# Patient Record
Sex: Female | Born: 1950
Health system: Southern US, Community
[De-identification: ages and names within clinical notes are randomized; demographics above are authoritative.]

## PROBLEM LIST (undated history)

## (undated) DIAGNOSIS — T8859XA Other complications of anesthesia, initial encounter: Secondary | ICD-10-CM

## (undated) DIAGNOSIS — N1832 Chronic kidney disease, stage 3b: Secondary | ICD-10-CM

## (undated) DIAGNOSIS — Z7401 Bed confinement status: Secondary | ICD-10-CM

## (undated) DIAGNOSIS — Z87442 Personal history of urinary calculi: Secondary | ICD-10-CM

## (undated) DIAGNOSIS — C539 Malignant neoplasm of cervix uteri, unspecified: Secondary | ICD-10-CM

## (undated) DIAGNOSIS — R112 Nausea with vomiting, unspecified: Secondary | ICD-10-CM

## (undated) DIAGNOSIS — M1990 Unspecified osteoarthritis, unspecified site: Secondary | ICD-10-CM

## (undated) DIAGNOSIS — T4145XA Adverse effect of unspecified anesthetic, initial encounter: Secondary | ICD-10-CM

## (undated) DIAGNOSIS — I10 Essential (primary) hypertension: Secondary | ICD-10-CM

## (undated) DIAGNOSIS — Z9889 Other specified postprocedural states: Secondary | ICD-10-CM

## (undated) HISTORY — DX: Unspecified osteoarthritis, unspecified site: M19.90

## (undated) HISTORY — DX: Essential (primary) hypertension: I10

## (undated) HISTORY — DX: Malignant neoplasm of cervix uteri, unspecified: C53.9

## (undated) HISTORY — PX: JOINT REPLACEMENT: SHX530

---
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
## 1979-09-11 HISTORY — PX: TOTAL HIP ARTHROPLASTY: SHX124

## 1988-09-10 HISTORY — PX: TOTAL KNEE ARTHROPLASTY: SHX125

## 2001-06-12 ENCOUNTER — Emergency Department (HOSPITAL_COMMUNITY): Admission: EM | Admit: 2001-06-12 | Discharge: 2001-06-12 | Payer: Self-pay | Admitting: Emergency Medicine

## 2001-06-12 ENCOUNTER — Encounter: Payer: Self-pay | Admitting: Emergency Medicine

## 2003-09-09 DIAGNOSIS — C539 Malignant neoplasm of cervix uteri, unspecified: Secondary | ICD-10-CM | POA: Insufficient documentation

## 2003-09-10 HISTORY — PX: ABDOMINAL HYSTERECTOMY: SHX81

## 2012-04-08 ENCOUNTER — Ambulatory Visit: Payer: Self-pay | Admitting: Family Medicine

## 2013-03-16 DIAGNOSIS — Z9889 Other specified postprocedural states: Secondary | ICD-10-CM | POA: Insufficient documentation

## 2013-03-16 DIAGNOSIS — Z96659 Presence of unspecified artificial knee joint: Secondary | ICD-10-CM | POA: Insufficient documentation

## 2014-05-21 DIAGNOSIS — H2510 Age-related nuclear cataract, unspecified eye: Secondary | ICD-10-CM | POA: Insufficient documentation

## 2014-07-07 DIAGNOSIS — H25819 Combined forms of age-related cataract, unspecified eye: Secondary | ICD-10-CM | POA: Insufficient documentation

## 2014-09-22 DIAGNOSIS — Z9849 Cataract extraction status, unspecified eye: Secondary | ICD-10-CM | POA: Insufficient documentation

## 2015-02-10 ENCOUNTER — Telehealth: Payer: Self-pay | Admitting: Family Medicine

## 2015-02-10 DIAGNOSIS — M069 Rheumatoid arthritis, unspecified: Secondary | ICD-10-CM

## 2015-02-10 NOTE — Telephone Encounter (Signed)
Pt would like to get a refill on Hydrocodone 7.5-325 mg. Thanks TNP

## 2015-02-11 NOTE — Telephone Encounter (Signed)
Pt called to see if RX was ready for pick up. Thanks TNP

## 2015-02-14 MED ORDER — HYDROCODONE-ACETAMINOPHEN 7.5-325 MG PO TABS: 1.0000 | ORAL_TABLET | Freq: Four times a day (QID) | ORAL | Status: DC | PRN

## 2015-02-14 NOTE — Telephone Encounter (Signed)
Advise patient prescription should be ready for pick up at the front desk now. Thanks for your patience.

## 2015-02-14 NOTE — Telephone Encounter (Signed)
Pt would like to get this today because she is going out of town tomorrow and will run out of the medications while out of town. Thanks TNP

## 2015-02-14 NOTE — Telephone Encounter (Signed)
Pt advised. Thanks TNP

## 2015-02-28 DIAGNOSIS — M069 Rheumatoid arthritis, unspecified: Secondary | ICD-10-CM | POA: Insufficient documentation

## 2015-02-28 DIAGNOSIS — R030 Elevated blood-pressure reading, without diagnosis of hypertension: Secondary | ICD-10-CM

## 2015-02-28 DIAGNOSIS — IMO0002 Reserved for concepts with insufficient information to code with codable children: Secondary | ICD-10-CM | POA: Insufficient documentation

## 2015-02-28 DIAGNOSIS — L821 Other seborrheic keratosis: Secondary | ICD-10-CM | POA: Insufficient documentation

## 2015-02-28 DIAGNOSIS — IMO0001 Reserved for inherently not codable concepts without codable children: Secondary | ICD-10-CM | POA: Insufficient documentation

## 2015-02-28 DIAGNOSIS — N200 Calculus of kidney: Secondary | ICD-10-CM | POA: Insufficient documentation

## 2015-02-28 DIAGNOSIS — N3040 Irradiation cystitis without hematuria: Secondary | ICD-10-CM | POA: Insufficient documentation

## 2015-02-28 DIAGNOSIS — I10 Essential (primary) hypertension: Secondary | ICD-10-CM | POA: Insufficient documentation

## 2015-03-01 ENCOUNTER — Encounter: Payer: Self-pay | Admitting: Family Medicine

## 2015-03-01 ENCOUNTER — Ambulatory Visit (INDEPENDENT_AMBULATORY_CARE_PROVIDER_SITE_OTHER): Payer: Medicare Other | Admitting: Family Medicine

## 2015-03-01 ENCOUNTER — Other Ambulatory Visit: Payer: Self-pay

## 2015-03-01 VITALS — BP 136/98 | HR 68 | Temp 98.1°F | Resp 16 | Wt 138.2 lb

## 2015-03-01 DIAGNOSIS — R030 Elevated blood-pressure reading, without diagnosis of hypertension: Secondary | ICD-10-CM

## 2015-03-01 DIAGNOSIS — N3040 Irradiation cystitis without hematuria: Secondary | ICD-10-CM

## 2015-03-01 DIAGNOSIS — IMO0001 Reserved for inherently not codable concepts without codable children: Secondary | ICD-10-CM

## 2015-03-01 DIAGNOSIS — M069 Rheumatoid arthritis, unspecified: Secondary | ICD-10-CM | POA: Diagnosis not present

## 2015-03-01 MED ORDER — PREDNISONE 5 MG (21) PO TBPK: 5.0000 mg | ORAL_TABLET | Freq: Every day | ORAL | Status: DC

## 2015-03-01 MED ORDER — OXAPROZIN 600 MG PO TABS: 600.0000 mg | ORAL_TABLET | Freq: Every day | ORAL | Status: DC

## 2015-03-01 NOTE — Progress Notes (Signed)
Subjective:    Patient ID: Jeanne [name], female    DOB: 1951-07-06, 64 y.o.   MRN: [id number]  HPI Arthritis flaring with worsening in back, knees and shoulders over the past couple week. Has run out of her Daypro and requesting prednisone taper. Throbbing pain across lower back in the area of impact when she fell flat on the floor of her laundry room when she lost her balance putting clothes in the dryer a year ago. Went to the ER at that time but not fractures on x-ray or CT scan of brain since she bumped her head.  Patient Active Problem List   Diagnosis Date Noted  . Chronic radiation cystitis 02/28/2015  . Coitalgia 02/28/2015  . Blood pressure elevated 02/28/2015  . BP (high blood pressure) 02/28/2015  . Calculus of kidney 02/28/2015  . Arthritis or polyarthritis, rheumatoid 02/28/2015  . Basal cell papilloma 02/28/2015  . H/O cataract extraction 09/22/2014  . Combined form of senile cataract 07/07/2014  . NS (nuclear sclerosis) 05/21/2014  . History of repair of hip joint 03/16/2013  . H/O total knee replacement 03/16/2013  . 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 Abdominal hysterectomy  09/10/2003  . Total hip arthroplasty Right 1981  . Total hip arthroplasty Left 1990  . Total knee arthroplasty Right    History  Substance Use Topics  . Smoking status: Former Smoker -- 20 years  . Smokeless tobacco: Not on file     Comment: Ada  . Alcohol Use: No   Family History  Problem Relation Age of Onset  . Breast cancer Mother   . Hypertension Father   . Cancer Father    Current Outpatient Prescriptions on File Prior to Visit  Medication Sig Dispense Refill  . HYDROcodone-acetaminophen (NORCO) 7.5-325 MG per tablet Take 1 tablet by mouth every 6 (six) hours as needed for moderate pain. 30 tablet 0  . oxaprozin (DAYPRO) 600 MG tablet Take 1 tablet by mouth daily.    . potassium citrate (UROCIT-K) 10  MEQ (1080 MG) SR tablet Take 2 tablets by mouth daily.     No current facility-administered medications on file prior to visit.   Allergies  Allergen Reactions  . Codeine Nausea Only and Nausea And Vomiting  . Sulfa Antibiotics     Mouth ulcers   Review of Systems  Constitutional: Negative.   HENT: Negative.   Respiratory: Negative.   Cardiovascular: Negative.   Gastrointestinal: Positive for constipation. Negative for blood in stool.       Constipation controlled with use of Senakot.   Genitourinary: Negative.   Musculoskeletal: Positive for myalgias, back pain, joint swelling, arthralgias and neck stiffness.  Neurological: Negative.   Psychiatric/Behavioral: Negative.       BP 136/98 mmHg  Pulse 68  Temp(Src) 98.1 F (36.7 C) (Oral)  Resp 16  Wt 138 lb 3.2 oz (62.687 kg)  Objective:   Physical Exam  Constitutional: She is oriented to person, place, and time. She appears well-nourished. No distress.  Musculoskeletal:  Scars on both knees from joint replacements. Enlarged and irregular elbow, finger, wrists and knees. Stiff with very much diminished ROM of fingers, wrist, back, knees and hips. Some soreness in upper lumbar region.  Neurological: She is alert and oriented to person, place, and time.  Abnormal gait due to multiple joint issue from RA.  Skin: Skin is warm and dry.      Assessment &  Plan:  1. Arthritis or polyarthritis, rheumatoid Onset as a child with first hip replacement surgery at age 89. Multiple nodules both elbows, scars from past hip and knee replacements and stiffness with very weak hands. Gait is very stiff, slow and shuffling with a waddle due to stiff hip joints. Recheck flare not controlled by usual Norco and Daypro. Will refill Daypro and add a prednisone taper. Recheck in a week if no better. - oxaprozin (DAYPRO) 600 MG tablet; Take 1 tablet (600 mg total) by mouth daily.  Dispense: 30 tablet; Refill: 3 - predniSONE (STERAPRED UNI-PAK 21 TAB) 5  MG (21) TBPK tablet; Take 1 tablet (5 mg total) by mouth daily. Tapered as directed on package  Dispense: 21 tablet; Refill: 0  2. Chronic radiation cystitis Onset after treatment of cervical cancer and radiation treatments. Easily has recurrent UTI's. Past urologists have not been able to completely control recurrent UTI's. Encouraged to drink extra fluids and recheck prn.  3. Blood pressure elevated Some elevation of BP today. Recommend she limit sodium intake and recheck BP at home. Recheck in 2 weeks if needed.

## 2015-03-10 ENCOUNTER — Encounter: Payer: Self-pay | Admitting: Family Medicine

## 2015-03-16 ENCOUNTER — Encounter: Payer: Self-pay | Admitting: Family Medicine

## 2015-03-21 NOTE — Telephone Encounter (Signed)
Pt called to see if there was response to her email sent on 03/10/15 and 03/16/15.  CB#[phone]/MJ

## 2015-03-22 ENCOUNTER — Telehealth: Payer: Self-pay | Admitting: Family Medicine

## 2015-03-22 ENCOUNTER — Encounter: Payer: Self-pay | Admitting: Family Medicine

## 2015-03-22 DIAGNOSIS — M069 Rheumatoid arthritis, unspecified: Secondary | ICD-10-CM

## 2015-03-22 MED ORDER — HYDROCODONE-ACETAMINOPHEN 7.5-325 MG PO TABS: 1.0000 | ORAL_TABLET | Freq: Four times a day (QID) | ORAL | Status: DC | PRN

## 2015-03-22 NOTE — Telephone Encounter (Signed)
Pt contacted office for refill request on the following medications: Hydrocodone 7.5-325 mg Pt stated she has sent emails for this refill on 6/30 & 03/17/15 and hasn't heard back. Pt stated she is on her last dose and would really like to get this today if possible. Thanks TNP

## 2015-03-22 NOTE — Telephone Encounter (Signed)
Medication refill and letter for lift chair should be at the front desk for pick up tomorrow.

## 2015-03-24 ENCOUNTER — Other Ambulatory Visit: Payer: Self-pay | Admitting: Family Medicine

## 2015-04-14 ENCOUNTER — Encounter: Payer: Self-pay | Admitting: Family Medicine

## 2015-04-18 ENCOUNTER — Other Ambulatory Visit: Payer: Self-pay | Admitting: Family Medicine

## 2015-04-18 ENCOUNTER — Telehealth: Payer: Self-pay

## 2015-04-18 DIAGNOSIS — M069 Rheumatoid arthritis, unspecified: Secondary | ICD-10-CM

## 2015-04-18 MED ORDER — HYDROCODONE-ACETAMINOPHEN 7.5-325 MG PO TABS: 1.0000 | ORAL_TABLET | Freq: Four times a day (QID) | ORAL | Status: DC | PRN

## 2015-04-18 NOTE — Telephone Encounter (Signed)
Patient aware Rx for HYDRO-codone-acetaminophen ([place]) 7.5-325 Mg per tablet is ready for pick up.  Thanks,  -Joseline

## 2015-04-18 NOTE — Telephone Encounter (Signed)
LMTCB  Thanks,  -Joseline 

## 2015-04-25 ENCOUNTER — Other Ambulatory Visit: Payer: Self-pay | Admitting: Family Medicine

## 2015-05-12 ENCOUNTER — Encounter: Payer: Self-pay | Admitting: Family Medicine

## 2015-05-12 DIAGNOSIS — M069 Rheumatoid arthritis, unspecified: Secondary | ICD-10-CM

## 2015-05-12 MED ORDER — HYDROCODONE-ACETAMINOPHEN 7.5-325 MG PO TABS: 1.0000 | ORAL_TABLET | Freq: Four times a day (QID) | ORAL | Status: DC | PRN

## 2015-05-17 ENCOUNTER — Telehealth: Payer: Self-pay | Admitting: Family Medicine

## 2015-05-17 NOTE — Telephone Encounter (Signed)
Insurance called wanting to know if patient is taking any rheumatoid medication or if she ever has taken any  Call back [phone]  Thanks Con Memos

## 2015-05-19 NOTE — Telephone Encounter (Signed)
Contacted Crystal with [employer] to advise her that patient is currently taking Daypro and Prednisone taper for rheumatoid arthritis. [employer] needed information for insurance purposes.

## 2015-05-21 ENCOUNTER — Other Ambulatory Visit: Payer: Self-pay | Admitting: Family Medicine

## 2015-05-30 ENCOUNTER — Encounter: Payer: Self-pay | Admitting: Family Medicine

## 2015-05-31 ENCOUNTER — Other Ambulatory Visit: Payer: Self-pay | Admitting: Family Medicine

## 2015-05-31 DIAGNOSIS — M069 Rheumatoid arthritis, unspecified: Secondary | ICD-10-CM

## 2015-05-31 MED ORDER — AMOXICILLIN 500 MG PO CAPS: 500.0000 mg | ORAL_CAPSULE | Freq: Three times a day (TID) | ORAL | Status: DC

## 2015-05-31 MED ORDER — PREDNISONE 5 MG (21) PO TBPK: 5.0000 mg | ORAL_TABLET | Freq: Every day | ORAL | Status: DC

## 2015-06-10 ENCOUNTER — Encounter: Payer: Self-pay | Admitting: Family Medicine

## 2015-06-13 ENCOUNTER — Telehealth: Payer: Self-pay | Admitting: Family Medicine

## 2015-06-13 DIAGNOSIS — M069 Rheumatoid arthritis, unspecified: Secondary | ICD-10-CM

## 2015-06-13 MED ORDER — HYDROCODONE-ACETAMINOPHEN 7.5-325 MG PO TABS: 1.0000 | ORAL_TABLET | Freq: Four times a day (QID) | ORAL | Status: DC | PRN

## 2015-06-13 NOTE — Telephone Encounter (Signed)
Pt inquiring about her email.  Needing her refill on HYDROcodone-acetaminophen (NORCO) 7.5-325 MG per tablet 05/12/15 -- Vickki Muff Chrismon, PA Take 1 tablet by mouth every 6 (six) hours as needed for moderate pain.   Thanks [employer]

## 2015-06-17 ENCOUNTER — Other Ambulatory Visit: Payer: Self-pay | Admitting: Family Medicine

## 2015-07-11 ENCOUNTER — Encounter: Payer: Self-pay | Admitting: Family Medicine

## 2015-07-12 ENCOUNTER — Other Ambulatory Visit: Payer: Self-pay | Admitting: Family Medicine

## 2015-07-12 DIAGNOSIS — M069 Rheumatoid arthritis, unspecified: Secondary | ICD-10-CM

## 2015-07-12 MED ORDER — HYDROCODONE-ACETAMINOPHEN 7.5-325 MG PO TABS: 1.0000 | ORAL_TABLET | Freq: Four times a day (QID) | ORAL | Status: DC | PRN

## 2015-07-14 ENCOUNTER — Other Ambulatory Visit: Payer: Self-pay | Admitting: Family Medicine

## 2015-08-08 ENCOUNTER — Telehealth: Payer: Self-pay | Admitting: Family Medicine

## 2015-08-08 ENCOUNTER — Encounter: Payer: Self-pay | Admitting: Family Medicine

## 2015-08-08 DIAGNOSIS — M069 Rheumatoid arthritis, unspecified: Secondary | ICD-10-CM

## 2015-08-08 NOTE — Telephone Encounter (Signed)
Pt needs refill HYDROcodone-acetaminophen (NORCO) 7.5-325 MG tablet 07/12/15 -- Vickki Muff Chrismon, PA Take 1 tablet by mouth every 6 (six) hours as needed for moderate pain.   Thanks Con Memos

## 2015-08-09 MED ORDER — HYDROCODONE-ACETAMINOPHEN 7.5-325 MG PO TABS: 1.0000 | ORAL_TABLET | Freq: Four times a day (QID) | ORAL | Status: DC | PRN

## 2015-08-09 NOTE — Telephone Encounter (Signed)
Pt is asking if she can pick this up today.  CB#[phone]/MW

## 2015-08-09 NOTE — Telephone Encounter (Signed)
Will write refill prescription for pick up at the front desk.. Remind patient to schedule follow up appointment.

## 2015-08-10 NOTE — Telephone Encounter (Signed)
Patient advised as directed below. Patient states she will schedule a follow up appointment.

## 2015-08-11 ENCOUNTER — Other Ambulatory Visit: Payer: Self-pay | Admitting: Family Medicine

## 2015-08-22 ENCOUNTER — Encounter: Payer: Self-pay | Admitting: Family Medicine

## 2015-08-23 ENCOUNTER — Encounter: Payer: Self-pay | Admitting: Family Medicine

## 2015-09-06 ENCOUNTER — Encounter: Payer: Self-pay | Admitting: Family Medicine

## 2015-09-07 ENCOUNTER — Telehealth: Payer: Self-pay

## 2015-09-07 ENCOUNTER — Other Ambulatory Visit: Payer: Self-pay | Admitting: Family Medicine

## 2015-09-07 NOTE — Telephone Encounter (Signed)
Patient requesting refill on Norco via email. Please review, dennis' patient-aa

## 2015-09-08 ENCOUNTER — Other Ambulatory Visit: Payer: Self-pay | Admitting: Family Medicine

## 2015-09-08 DIAGNOSIS — M069 Rheumatoid arthritis, unspecified: Secondary | ICD-10-CM

## 2015-09-08 MED ORDER — HYDROCODONE-ACETAMINOPHEN 7.5-325 MG PO TABS: 1.0000 | ORAL_TABLET | Freq: Four times a day (QID) | ORAL | Status: DC | PRN

## 2015-09-08 NOTE — Telephone Encounter (Signed)
Jeanne [name] patient

## 2015-09-08 NOTE — Telephone Encounter (Signed)
Re-printed ok per Dr. Venia Minks. L/M sayinig that Rx was ready for pick up.

## 2015-09-08 NOTE — Telephone Encounter (Signed)
Pt contacted office for refill request on the following medications:  HYDROcodone-acetaminophen (NORCO) 7.5-325 MG tablet.  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  Pt will be out of medication by the weekend/MW

## 2015-10-04 ENCOUNTER — Encounter: Payer: Self-pay | Admitting: Family Medicine

## 2015-10-04 ENCOUNTER — Ambulatory Visit (INDEPENDENT_AMBULATORY_CARE_PROVIDER_SITE_OTHER): Payer: Medicare Other | Admitting: Family Medicine

## 2015-10-04 VITALS — BP 100/60 | HR 72 | Temp 98.6°F | Resp 16 | Ht 64.0 in | Wt 145.0 lb

## 2015-10-04 DIAGNOSIS — Z96653 Presence of artificial knee joint, bilateral: Secondary | ICD-10-CM | POA: Diagnosis not present

## 2015-10-04 DIAGNOSIS — N200 Calculus of kidney: Secondary | ICD-10-CM | POA: Diagnosis not present

## 2015-10-04 DIAGNOSIS — M0579 Rheumatoid arthritis with rheumatoid factor of multiple sites without organ or systems involvement: Secondary | ICD-10-CM

## 2015-10-04 DIAGNOSIS — Z9889 Other specified postprocedural states: Secondary | ICD-10-CM

## 2015-10-04 MED ORDER — HYDROCODONE-ACETAMINOPHEN 7.5-325 MG PO TABS: 1.0000 | ORAL_TABLET | Freq: Four times a day (QID) | ORAL | Status: DC | PRN

## 2015-10-04 MED ORDER — POTASSIUM CITRATE ER 10 MEQ (1080 MG) PO TBCR: 20.0000 meq | EXTENDED_RELEASE_TABLET | Freq: Every day | ORAL | Status: DC

## 2015-10-04 NOTE — Progress Notes (Signed)
Patient ID: Jeanne [name], female   DOB: 1951/07/07, 65 y.o.   MRN: [id number]       Patient: Jeanne [name] Female    DOB: December 20, 1950   65 y.o.   MRN: [id number] Visit Date: 10/04/2015  Today's Provider: [name], PA   Chief Complaint  Patient presents with  . Rheumatoid Arthritis   Subjective:    HPI Rheumatoid Arthritis follow-up: Patient complains of rheumatoid arthritis.  Symptoms have been present for several years. Onset was gradual. Symptoms include joint pain and are of moderate severity. Patient denies afternoon fatigue. Symptoms are made worse by: housework.  Symptoms are helped by arthritis medications.  Associated symptoms include joint pain. Patient denies associated fevers and new headache.  Overall disease activity:  unchanged. Limitation on activities include difficulty with walking.      Patient Active Problem List   Diagnosis Date Noted  . Chronic radiation cystitis 02/28/2015  . Coitalgia 02/28/2015  . Blood pressure elevated 02/28/2015  . BP (high blood pressure) 02/28/2015  . Calculus of kidney 02/28/2015  . Arthritis or polyarthritis, rheumatoid ([place]) 02/28/2015  . Basal cell papilloma 02/28/2015  . H/O cataract extraction 09/22/2014  . Combined form of senile cataract 07/07/2014  . NS (nuclear sclerosis) 05/21/2014  . History of repair of hip joint 03/16/2013  . H/O total knee replacement 03/16/2013  . Adenosquamous carcinoma of cervix ([place]) 09/09/2003   Past Surgical History  Procedure Laterality Date  . Abdominal hysterectomy  09/10/2003  . Total hip arthroplasty Right 1981  . Total hip arthroplasty Left 1990  . Total knee arthroplasty Right    Family History  Problem Relation Age of Onset  . Breast cancer Mother   . Hypertension Father   . Cancer Father    Allergies  Allergen Reactions  . Codeine Nausea Only and Nausea And Vomiting  . Sulfa Antibiotics     Mouth ulcers   Previous Medications   DOCUSATE SODIUM (COLACE) 100 MG  CAPSULE    Take 1 capsule by mouth 2 (two) times daily.   HYDROCODONE-ACETAMINOPHEN (NORCO) 7.5-325 MG TABLET    Take 1 tablet by mouth every 6 (six) hours as needed for moderate pain.   NITROFURANTOIN (MACRODANTIN) 50 MG CAPSULE    TAKE 1 CAPSULE BY MOUTH DAILY   OXAPROZIN (DAYPRO) 600 MG TABLET    Take 1 tablet (600 mg total) by mouth daily.   POTASSIUM CITRATE (UROCIT-K) 10 MEQ (1080 MG) SR TABLET    Take 2 tablets by mouth daily.   SENNA (SENOKOT) 8.6 MG TABLET    Take 1 tablet by mouth daily.    Review of Systems  Constitutional: Negative.   Cardiovascular: Negative.   Musculoskeletal: Positive for back pain, joint swelling and arthralgias.    Social History  Substance Use Topics  . Smoking status: Former Smoker -- 20 years    Quit date: 09/09/1989  . Smokeless tobacco: Never Used     Comment: QUIT IN 37  . Alcohol Use: No   Objective:   BP 100/60 mmHg  Pulse 72  Temp(Src) 98.6 F (37 C) (Oral)  Resp 16  Ht 5\' 4"  (1.626 m)  Wt 145 lb (65.772 kg)  BMI 24.88 kg/m2  SpO2 97%  Physical Exam  Constitutional: She is oriented to person, place, and time. She appears well-developed and well-nourished. No distress.  HENT:  Head: Normocephalic and atraumatic.  Right Ear: Hearing normal.  Left Ear: Hearing normal.  Nose: Nose normal.  Eyes: Conjunctivae and lids  are normal. Right eye exhibits no discharge. Left eye exhibits no discharge. No scleral icterus.  Neck: No thyromegaly present.  Stiff TMJ's and neck.  Cardiovascular: Normal rate, regular rhythm and normal heart sounds.   Pulmonary/Chest: Effort normal and breath sounds normal. No respiratory distress.  Abdominal: Soft. Bowel sounds are normal.  Musculoskeletal:  Gait and Station- Note: Slow stiff tottering gait. Spine, Ribs and Pelvis- Note: Some discomfort in lumbar spine. Mild tenderness without deformities. Very stiff and limited ROM. Right Upper Extremity- Note: Ulnar deviation of fingers at MCP. Very  limited ability to grip. Unable to bend finger joints. Stiffness of shoulders and elbows. Left Upper Extremity- Note: Ulnar deviation of MCP joints with straight stiff fingers. Unable to flex into a fist and tender to palpate. Stiffness of shoulders and elbows. Wrists stiff and slightly enlarged. Right Lower Extremity- Note: Hip very stiff with history of joint replacement and knee replacement. Increase in pain and instability of artificial joint. Left Lower Extremity- Note: Decrease in hip ROM. Pain with crepitus of the left knee. Decrease ROM (flex only to 80 degrees and extend to 170 degrees). Prominent joint with large scar from past surgery to clean up arthritis and spurs in the 1980's. Upper ExtremityNote: Nodules on elbows. Ulnar deviation of fingers - both hands - with large MCP joints. Extremely diminished ability to flex or extend fingers.  Lymphadenopathy:    She has no cervical adenopathy.  Neurological: She is alert and oriented to person, place, and time.  Skin: Skin is intact. No lesion and no rash noted.  Psychiatric: She has a normal mood and affect. Her speech is normal and behavior is normal. Thought content normal.      Assessment & Plan:     1. Rheumatoid arthritis involving multiple sites with positive rheumatoid factor (HCC) Onset as a child with first hip replacement surgery at age 21. Multiple nodules both elbows, scars from past hip and knee replacements and stiffness with very weak hands. Gait is very stiff, slow and shuffling with a waddle due to stiff hip joints. - CBC with Differential/Platelet - COMPLETE METABOLIC PANEL WITH GFR - HYDROcodone-acetaminophen (NORCO) 7.5-325 MG tablet; Take 1 tablet by mouth every 6 (six) hours as needed for moderate pain.  Dispense: 30 tablet; Refill: 0  2. H/O total knee replacement History of right knee replacement in 1990 and surgery in 1980's to clean up spurs with arthritis debris in the left knee.  3. History of repair  of hip joint Right hip replacement 1972 and left hip replacement in 1981 due to severe rheumatoid arthritis degeneration.  4. Calculus of kidney No recent recurrence and chronic recurrent UTI's due to past radiation treatment for cervical cancer. Continue Urocrit to prevent further stones as advised by her urologist. - potassium citrate (UROCIT-K) 10 MEQ (1080 MG) SR tablet; Take 2 tablets (20 mEq total) by mouth daily.  Dispense: 180 tablet; Refill: [place], [facility]

## 2015-10-06 ENCOUNTER — Encounter: Payer: Self-pay | Admitting: Family Medicine

## 2015-10-25 ENCOUNTER — Encounter: Payer: Self-pay | Admitting: Family Medicine

## 2015-10-26 ENCOUNTER — Encounter: Payer: Self-pay | Admitting: Family Medicine

## 2015-10-28 ENCOUNTER — Other Ambulatory Visit: Payer: Self-pay | Admitting: Family Medicine

## 2015-10-28 DIAGNOSIS — M0579 Rheumatoid arthritis with rheumatoid factor of multiple sites without organ or systems involvement: Secondary | ICD-10-CM

## 2015-10-28 MED ORDER — HYDROCODONE-ACETAMINOPHEN 7.5-325 MG PO TABS: 1.0000 | ORAL_TABLET | Freq: Four times a day (QID) | ORAL | Status: DC | PRN

## 2015-11-21 ENCOUNTER — Encounter: Payer: Self-pay | Admitting: Family Medicine

## 2015-11-22 ENCOUNTER — Other Ambulatory Visit: Payer: Self-pay | Admitting: Family Medicine

## 2015-11-22 DIAGNOSIS — M0579 Rheumatoid arthritis with rheumatoid factor of multiple sites without organ or systems involvement: Secondary | ICD-10-CM

## 2015-11-22 MED ORDER — HYDROCODONE-ACETAMINOPHEN 7.5-325 MG PO TABS: 1.0000 | ORAL_TABLET | Freq: Four times a day (QID) | ORAL | Status: DC | PRN

## 2015-12-12 ENCOUNTER — Other Ambulatory Visit: Payer: Self-pay | Admitting: Family Medicine

## 2015-12-12 ENCOUNTER — Encounter: Payer: Self-pay | Admitting: Family Medicine

## 2015-12-12 DIAGNOSIS — M0579 Rheumatoid arthritis with rheumatoid factor of multiple sites without organ or systems involvement: Secondary | ICD-10-CM

## 2015-12-12 MED ORDER — HYDROCODONE-ACETAMINOPHEN 7.5-325 MG PO TABS: 1.0000 | ORAL_TABLET | Freq: Four times a day (QID) | ORAL | Status: DC | PRN

## 2015-12-13 ENCOUNTER — Encounter: Payer: Self-pay | Admitting: Family Medicine

## 2015-12-18 ENCOUNTER — Encounter: Payer: Self-pay | Admitting: Family Medicine

## 2015-12-19 ENCOUNTER — Other Ambulatory Visit: Payer: Self-pay

## 2015-12-19 ENCOUNTER — Other Ambulatory Visit: Payer: Self-pay | Admitting: Family Medicine

## 2015-12-19 DIAGNOSIS — M0579 Rheumatoid arthritis with rheumatoid factor of multiple sites without organ or systems involvement: Secondary | ICD-10-CM

## 2015-12-19 MED ORDER — PREDNISONE 5 MG (21) PO TBPK: 5.0000 mg | ORAL_TABLET | Freq: Every day | ORAL | Status: DC

## 2016-01-17 ENCOUNTER — Other Ambulatory Visit: Payer: Self-pay | Admitting: Family Medicine

## 2016-01-17 ENCOUNTER — Encounter: Payer: Self-pay | Admitting: Family Medicine

## 2016-01-17 DIAGNOSIS — M0579 Rheumatoid arthritis with rheumatoid factor of multiple sites without organ or systems involvement: Secondary | ICD-10-CM

## 2016-01-17 MED ORDER — HYDROCODONE-ACETAMINOPHEN 7.5-325 MG PO TABS: 1.0000 | ORAL_TABLET | Freq: Four times a day (QID) | ORAL | Status: DC | PRN

## 2016-02-09 ENCOUNTER — Encounter: Payer: Self-pay | Admitting: Family Medicine

## 2016-02-10 ENCOUNTER — Other Ambulatory Visit: Payer: Self-pay | Admitting: Family Medicine

## 2016-02-10 DIAGNOSIS — M0579 Rheumatoid arthritis with rheumatoid factor of multiple sites without organ or systems involvement: Secondary | ICD-10-CM

## 2016-02-10 MED ORDER — HYDROCODONE-ACETAMINOPHEN 7.5-325 MG PO TABS: 1.0000 | ORAL_TABLET | Freq: Four times a day (QID) | ORAL | Status: DC | PRN

## 2016-02-17 ENCOUNTER — Ambulatory Visit: Payer: Medicare Other | Admitting: Family Medicine

## 2016-02-21 ENCOUNTER — Encounter: Payer: Self-pay | Admitting: Family Medicine

## 2016-02-21 ENCOUNTER — Ambulatory Visit (INDEPENDENT_AMBULATORY_CARE_PROVIDER_SITE_OTHER): Payer: Medicare Other | Admitting: Family Medicine

## 2016-02-21 VITALS — BP 138/88 | HR 80 | Temp 98.2°F | Resp 16 | Wt 151.8 lb

## 2016-02-21 DIAGNOSIS — Z96653 Presence of artificial knee joint, bilateral: Secondary | ICD-10-CM

## 2016-02-21 DIAGNOSIS — Z9889 Other specified postprocedural states: Secondary | ICD-10-CM | POA: Diagnosis not present

## 2016-02-21 DIAGNOSIS — N3040 Irradiation cystitis without hematuria: Secondary | ICD-10-CM | POA: Diagnosis not present

## 2016-02-21 DIAGNOSIS — M0579 Rheumatoid arthritis with rheumatoid factor of multiple sites without organ or systems involvement: Secondary | ICD-10-CM

## 2016-02-21 NOTE — Progress Notes (Signed)
Patient: Jeanne [name] Female    DOB: 1950-12-07   65 y.o.   MRN: [id number] Visit Date: 02/21/2016  Today's Provider: [name], PA   Chief Complaint  Patient presents with  . Follow-up    Rheumatoid Arthritis   Subjective:    HPI Rheumatoid Arthritis Follow-up: Patient complains of rheumatoid arthritis.  Symptoms have been present for several years.Symptoms include joint pain and are of moderate severity. Patient denies afternoon fatigue. Symptoms are made worse by: movement and house work.  Symptoms are helped by arthritis medications.  Associated symptoms include joint pain.Overall disease activity:  unchanged. Limitation on activities include difficulty with walking. Has gained 13 lbs over the past year and notices joints are worse.  Past Medical History  Diagnosis Date  . Arthritis   . Hypertension    Past Surgical History  Procedure Laterality Date  . Abdominal hysterectomy  09/10/2003  . Total hip arthroplasty Right 1981  . Total hip arthroplasty Left 1990  . Total knee arthroplasty Right    Family History  Problem Relation Age of Onset  . Breast cancer Mother   . Hypertension Father   . Cancer Father    Allergies  Allergen Reactions  . Codeine Nausea Only and Nausea And Vomiting  . Sulfa Antibiotics     Mouth ulcers   Current Meds  Medication Sig  . docusate sodium (COLACE) 100 MG capsule Take 1 capsule by mouth 2 (two) times daily.  Mar[place] Kitchen HYDROcodone-acetaminophen (NORCO) 7.5-325 MG tablet Take 1 tablet by mouth every 6 (six) hours as needed for moderate pain.  . nitrofurantoin (MACRODANTIN) 50 MG capsule TAKE 1 CAPSULE BY MOUTH DAILY  . oxaprozin (DAYPRO) 600 MG tablet TAKE 1 TABLET(600 MG) BY MOUTH DAILY  . potassium citrate (UROCIT-K) 10 MEQ (1080 MG) SR tablet Take 2 tablets (20 mEq total) by mouth daily.    Review of Systems  Constitutional: Negative.   HENT: Negative.   Respiratory: Negative.   Cardiovascular: Negative for chest pain,  palpitations and leg swelling.  Gastrointestinal: Positive for constipation.  Genitourinary: Negative.        Occasional bladder pains from radiation cystitis.  Musculoskeletal: Positive for myalgias, back pain, arthralgias and gait problem.    Social History  Substance Use Topics  . Smoking status: Former Smoker -- 20 years    Quit date: 09/09/1989  . Smokeless tobacco: Never Used     Comment: QUIT IN 35  . Alcohol Use: No   Objective:   BP 138/88 mmHg  Pulse 80  Temp(Src) 98.2 F (36.8 C) (Oral)  Resp 16  Wt 151 lb 12.8 oz (68.856 kg) Wt Readings from Last 3 Encounters:  02/21/16 151 lb 12.8 oz (68.856 kg)  10/04/15 145 lb (65.772 kg)  03/01/15 138 lb 3.2 oz (62.687 kg)    Physical Exam  Constitutional: She is oriented to person, place, and time. She appears well-developed and well-nourished. No distress.  HENT:  Head: Normocephalic and atraumatic.  Right Ear: Hearing normal.  Left Ear: Hearing normal.  Nose: Nose normal.  Stiff jaw.  Eyes: Conjunctivae and lids are normal. Right eye exhibits no discharge. Left eye exhibits no discharge. No scleral icterus.  Neck:  Limited ROM - very stiff.  Cardiovascular: Normal rate, regular rhythm and normal heart sounds.   Pulmonary/Chest: Effort normal and breath sounds normal. No respiratory distress.  Abdominal: Soft. Bowel sounds are normal.  Musculoskeletal: She exhibits tenderness.  Gait and Station- Slow stiff tottering gait.  Spine, Ribs and Pelvis- Some discomfort in lumbar spine. Mild tenderness without deformities. Very stiff and limited ROM. Right Upper Extremity- Ulnar deviation of fingers at MCP. Very limited ability to grip. Unable to bend finger joints. Stiffness of shoulders and elbows. Left Upper Extremity- Ulnar deviation of MCP joints with straight stiff fingers. Unable to flex into a fist and tender to palpate. Stiffness of shoulders and elbows. Wrists stiff and slightly enlarged. Right Lower Extremity-  Hip very stiff with history of joint replacement and knee replacement. Tender to palpate. Left Lower Extremity- Decrease in hip ROM. Pain with crepitus of the left knee. Decrease ROM (flex only to 80 degrees and extend to 170 degrees). Prominent joint with large scar from past surgery to clean up arthritis and spurs in the 1980's. Upper Extremity- Nodules on elbows. Ulnar deviation of fingers - both hands - with large MCP joints. Extremely diminished ability to flex or extend fingers.  Lymphadenopathy:    She has no cervical adenopathy.  Neurological: She is alert and oriented to person, place, and time.  Skin: Skin is intact. No lesion and no rash noted.  Psychiatric: She has a normal mood and affect. Her speech is normal and behavior is normal. Thought content normal.        Assessment & Plan:     1. Rheumatoid arthritis involving multiple sites with positive rheumatoid factor (HCC) History of RA all her life with both hip joint replacements and Right knee replacement. Had some surgeries on hands and fingers to try to help with joint movement but still very stiff and deformed now. Uses Daypro for inflammation and Norco prn pain spikes that limit abilities to carry out ADL's. Will continue present medications and get lab recheck.  2. H/O total knee replacement, right Had right knee replacement in 1990. Has had surgical clean up of cartilage and spurs from the left knee in the past. Very stiff and painful with walking. Continues Daypro and Norco for pain and inflammation with fair control.  3. History of repair of hip joint Joint replacement of right hip in 1981 and left hip in 1990. Still very stiff with waddling gait. Continues to have chronic pain due to rheumatoid arthritis.  4. Chronic radiation cystitis History of cervical cancer with abdominal hysterectomy and subsequent radiation therapy in 2004. Sustained damage to her bladder and has chronic cystitis with intermittent  infections. Follow up occasionally with urologist. Seems stable and clear today.       [name], PA  [facility]

## 2016-02-22 LAB — CBC WITH DIFFERENTIAL/PLATELET
BASOS ABS: 0.1 10*3/uL (ref 0.0–0.2)
Basos: 1 %
EOS (ABSOLUTE): 0.3 10*3/uL (ref 0.0–0.4)
Eos: 4 %
Hematocrit: 45 % (ref 34.0–46.6)
Hemoglobin: 14.9 g/dL (ref 11.1–15.9)
IMMATURE GRANS (ABS): 0 10*3/uL (ref 0.0–0.1)
IMMATURE GRANULOCYTES: 0 %
LYMPHS: 19 %
Lymphocytes Absolute: 1.4 10*3/uL (ref 0.7–3.1)
MCH: 28.9 pg (ref 26.6–33.0)
MCHC: 33.1 g/dL (ref 31.5–35.7)
MCV: 87 fL (ref 79–97)
Monocytes Absolute: 0.5 10*3/uL (ref 0.1–0.9)
Monocytes: 7 %
NEUTROS PCT: 69 %
Neutrophils Absolute: 5 10*3/uL (ref 1.4–7.0)
PLATELETS: 345 10*3/uL (ref 150–379)
RBC: 5.15 x10E6/uL (ref 3.77–5.28)
RDW: 13.7 % (ref 12.3–15.4)
WBC: 7.2 10*3/uL (ref 3.4–10.8)

## 2016-02-22 LAB — CMP14+EGFR
ALK PHOS: 253 IU/L — AB (ref 39–117)
ALT: 96 IU/L — AB (ref 0–32)
AST: 74 IU/L — AB (ref 0–40)
Albumin/Globulin Ratio: 1.6 (ref 1.2–2.2)
Albumin: 4.2 g/dL (ref 3.6–4.8)
BILIRUBIN TOTAL: 0.5 mg/dL (ref 0.0–1.2)
BUN/Creatinine Ratio: 18 (ref 12–28)
BUN: 14 mg/dL (ref 8–27)
CHLORIDE: 104 mmol/L (ref 96–106)
CO2: 17 mmol/L — ABNORMAL LOW (ref 18–29)
Calcium: 9.5 mg/dL (ref 8.7–10.3)
Creatinine, Ser: 0.76 mg/dL (ref 0.57–1.00)
GFR calc Af Amer: 97 mL/min/{1.73_m2} (ref >59)
GFR calc non Af Amer: 84 mL/min/{1.73_m2} (ref >59)
Globulin, Total: 2.7 g/dL (ref 1.5–4.5)
Glucose: 104 mg/dL — ABNORMAL HIGH (ref 65–99)
Potassium: 4.6 mmol/L (ref 3.5–5.2)
Sodium: 142 mmol/L (ref 134–144)
Total Protein: 6.9 g/dL (ref 6.0–8.5)

## 2016-02-28 ENCOUNTER — Telehealth: Payer: Self-pay

## 2016-02-28 DIAGNOSIS — R768 Other specified abnormal immunological findings in serum: Secondary | ICD-10-CM

## 2016-02-28 NOTE — Telephone Encounter (Signed)
Advised patient as below. Lab test ordered and patient will have another specimen drawn.     Notes Recorded by Margo Common, PA on 02/28/2016 at 11:15 AM Hepatitis panel shows high antibodies for Hepatitis C. Need to get HCV Nucleic Acid Amplification test to rule out active disease. Notes Recorded by Wilder Glade, CMA on 02/27/2016 at 12:43 PM Hepatitis panel was added. Will await results to advise patient all at once.

## 2016-02-28 NOTE — Telephone Encounter (Signed)
-----   Message from Margo Common, [place] sent at 02/24/2016  2:45 PM EDT ----- All blood tests essentially normal except high alkaline phosphatase and elevations of liver enzymes. These changes may be due to past cancer, radiation cystitis, rheumatoid arthritis and pain medications. Ask lab to run hepatitis panel.

## 2016-02-29 LAB — HEPATITIS PANEL, ACUTE
HEP B S AG: NEGATIVE
Hep A IgM: NEGATIVE
Hep B C IgM: NEGATIVE
Hep C Virus Ab: >11 s/co ratio — ABNORMAL HIGH (ref 0.0–0.9)

## 2016-02-29 LAB — SPECIMEN STATUS REPORT

## 2016-03-01 LAB — HCV NAA QUALITATIVE (RFX GENO): HCV RNA NAA QUALITATIVE: NEGATIVE

## 2016-03-01 LAB — SPECIMEN STATUS REPORT

## 2016-03-02 ENCOUNTER — Telehealth: Payer: Self-pay | Admitting: Family Medicine

## 2016-03-02 LAB — HCV RNA NAA QUAL RFX TO QUANT: HCV RNA NAA Qualitative: NEGATIVE

## 2016-03-02 NOTE — Telephone Encounter (Signed)
Advised patient the negative HCV RNA test confirmed she does NOT have hepatitis C. Probably a cross reactivity giving a false positive Hep C antibody test. Patient understands and is relieved. Recheck prn.

## 2016-03-02 NOTE — Telephone Encounter (Signed)
Please review. Thanks!  

## 2016-03-02 NOTE — Telephone Encounter (Signed)
Pt would like Simona Huh to return her call today if possible. Pt stated she wanted to speak with Simona Huh about her test results. Thanks TNP

## 2016-03-06 ENCOUNTER — Encounter: Payer: Self-pay | Admitting: Family Medicine

## 2016-03-06 ENCOUNTER — Other Ambulatory Visit: Payer: Self-pay | Admitting: Family Medicine

## 2016-03-06 DIAGNOSIS — M0579 Rheumatoid arthritis with rheumatoid factor of multiple sites without organ or systems involvement: Secondary | ICD-10-CM

## 2016-03-06 MED ORDER — HYDROCODONE-ACETAMINOPHEN 7.5-325 MG PO TABS: 1.0000 | ORAL_TABLET | Freq: Four times a day (QID) | ORAL | Status: DC | PRN

## 2016-03-11 ENCOUNTER — Encounter: Payer: Self-pay | Admitting: Family Medicine

## 2016-03-14 ENCOUNTER — Encounter: Payer: Self-pay | Admitting: Family Medicine

## 2016-03-14 ENCOUNTER — Ambulatory Visit (INDEPENDENT_AMBULATORY_CARE_PROVIDER_SITE_OTHER): Payer: Medicare Other | Admitting: Family Medicine

## 2016-03-14 VITALS — BP 140/90 | HR 100 | Temp 98.2°F | Resp 20 | Ht 64.0 in | Wt 152.0 lb

## 2016-03-14 DIAGNOSIS — N3090 Cystitis, unspecified without hematuria: Secondary | ICD-10-CM | POA: Diagnosis not present

## 2016-03-14 LAB — POCT URINALYSIS DIPSTICK
Bilirubin, UA: NEGATIVE
Glucose, UA: NEGATIVE
KETONES UA: POSITIVE
Nitrite, UA: POSITIVE
PH UA: 8
PROTEIN UA: 100
SPEC GRAV UA: 1.01
UROBILINOGEN UA: 0.2

## 2016-03-14 MED ORDER — DOXYCYCLINE HYCLATE 100 MG PO TABS: 100.0000 mg | ORAL_TABLET | Freq: Two times a day (BID) | ORAL | Status: DC

## 2016-03-14 NOTE — Patient Instructions (Signed)
We will call you with the culture results 

## 2016-03-14 NOTE — Addendum Note (Signed)
Addended by: Quay Burow on: 03/14/2016 11:34 AM   Modules accepted: Miquel Dunn

## 2016-03-14 NOTE — Progress Notes (Addendum)
Subjective:     Patient ID: Jeanne [name], female   DOB: [birth year], 65 y.o.   MRN: [id number]  HPI Chief Complaint  Patient presents with  . Urinary Tract Infection  States she has developed cloudy, malodorous, bloody urine with bladder pain over the last 3 days. Currently on nitrofurantion prophylaxis due to hx of chronic cystitis. Accompanied by her teenage daughter today.  Review of Systems  Constitutional: Negative for fever and chills.       Objective:   Physical Exam  Constitutional: She appears well-developed and well-nourished. No distress.  Genitourinary:  No CVA tenderness       Assessment:    1. Cystitis - POCT urinalysis dipstick - Urine culture - doxycycline (VIBRA-TABS) 100 MG tablet; Take 1 tablet (100 mg total) by mouth 2 (two) times daily.  Dispense: 14 tablet; Refill: 0    Plan:    Further f/u pending urine culture.

## 2016-03-15 ENCOUNTER — Ambulatory Visit: Payer: Medicare Other | Admitting: Family Medicine

## 2016-03-16 ENCOUNTER — Other Ambulatory Visit: Payer: Self-pay | Admitting: Family Medicine

## 2016-03-16 ENCOUNTER — Telehealth: Payer: Self-pay | Admitting: Family Medicine

## 2016-03-16 DIAGNOSIS — N3090 Cystitis, unspecified without hematuria: Secondary | ICD-10-CM

## 2016-03-16 MED ORDER — CEPHALEXIN 500 MG PO CAPS: 500.0000 mg | ORAL_CAPSULE | Freq: Two times a day (BID) | ORAL | Status: DC

## 2016-03-16 NOTE — Telephone Encounter (Signed)
Pt called to get results on urine culture that was done on 03/14/16 and to advised that her symptoms haven't improved. Please advise. Thanks TNP

## 2016-03-16 NOTE — Telephone Encounter (Signed)
Advised pt. Jeanne [name], CMA  

## 2016-03-16 NOTE — Telephone Encounter (Signed)
Please see if preliminary or final urine culture report available.

## 2016-03-16 NOTE — Telephone Encounter (Signed)
[facility] [employer]. Is faxing over. Renaldo Fiddler, CMA

## 2016-03-16 NOTE — Telephone Encounter (Signed)
Let her know she has a Proteus infection resistant to both nitrofurantoin and the doxycycline I had her on. I will send in a different antibiotic to start today.

## 2016-03-28 ENCOUNTER — Encounter: Payer: Self-pay | Admitting: Family Medicine

## 2016-04-03 ENCOUNTER — Ambulatory Visit: Payer: Self-pay | Admitting: Family Medicine

## 2016-04-05 ENCOUNTER — Encounter: Payer: Self-pay | Admitting: Family Medicine

## 2016-04-06 ENCOUNTER — Encounter: Payer: Self-pay | Admitting: Family Medicine

## 2016-04-06 ENCOUNTER — Ambulatory Visit (INDEPENDENT_AMBULATORY_CARE_PROVIDER_SITE_OTHER): Payer: Medicare Other | Admitting: Family Medicine

## 2016-04-06 VITALS — BP 124/86 | HR 110 | Temp 98.8°F | Resp 18 | Wt 151.0 lb

## 2016-04-06 DIAGNOSIS — N3040 Irradiation cystitis without hematuria: Secondary | ICD-10-CM | POA: Diagnosis not present

## 2016-04-06 DIAGNOSIS — N3090 Cystitis, unspecified without hematuria: Secondary | ICD-10-CM | POA: Diagnosis not present

## 2016-04-06 DIAGNOSIS — M0579 Rheumatoid arthritis with rheumatoid factor of multiple sites without organ or systems involvement: Secondary | ICD-10-CM | POA: Diagnosis not present

## 2016-04-06 LAB — POCT URINALYSIS DIPSTICK
GLUCOSE UA: NEGATIVE
Ketones, UA: NEGATIVE
Nitrite, UA: POSITIVE
PROTEIN UA: 30
SPEC GRAV UA: 1.02
UROBILINOGEN UA: 0.2
pH, UA: 6

## 2016-04-06 MED ORDER — CEPHALEXIN 500 MG PO CAPS: 500.0000 mg | ORAL_CAPSULE | Freq: Two times a day (BID) | ORAL | 0 refills | Status: DC

## 2016-04-06 MED ORDER — HYDROCODONE-ACETAMINOPHEN 7.5-325 MG PO TABS: 1.0000 | ORAL_TABLET | Freq: Four times a day (QID) | ORAL | 0 refills | Status: DC | PRN

## 2016-04-06 NOTE — Progress Notes (Signed)
Patient: Jeanne [name] Female    DOB: 27-Aug-1951   65 y.o.   MRN: [id number] Visit Date: 04/06/2016  Today's Provider: [name], PA   Chief Complaint  Patient presents with  . Cystitis    recheck   Subjective:    HPI  Recheck of Cystitis:  Patient was seen by Carmon Ginsberg PA-C on 03/14/2016 for Cystitis and was treated with Doxycycline and Nitrofurantoin. Patient comes in today stating she has completed all doses of medication and still has urinary symptoms. She reports her urine has an odor and she has blood in her urine.   Past Medical History:  Diagnosis Date  . Arthritis   . Hypertension    Patient Active Problem List   Diagnosis Date Noted  . Chronic radiation cystitis 02/28/2015  . Coitalgia 02/28/2015  . Blood pressure elevated 02/28/2015  . BP (high blood pressure) 02/28/2015  . Calculus of kidney 02/28/2015  . Arthritis or polyarthritis, rheumatoid ([place]) 02/28/2015  . Basal cell papilloma 02/28/2015  . H/O cataract extraction 09/22/2014  . Combined form of senile cataract 07/07/2014  . NS (nuclear sclerosis) 05/21/2014  . History of repair of hip joint 03/16/2013  . H/O total knee replacement 03/16/2013  . 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 ABDOMINAL HYSTERECTOMY  09/10/2003  . TOTAL HIP ARTHROPLASTY Right 1981  . TOTAL HIP ARTHROPLASTY Left 1990  . TOTAL KNEE ARTHROPLASTY Right    Family History  Problem Relation Age of Onset  . Breast cancer Mother   . Hypertension Father   . Cancer Father    Allergies  Allergen Reactions  . Codeine Nausea Only and Nausea And Vomiting  . Sulfa Antibiotics     Mouth ulcers   Current Meds  Medication Sig  . docusate sodium (COLACE) 100 MG capsule Take 1 capsule by mouth 2 (two) times daily.  Marland Kitchen HYDROcodone-acetaminophen (NORCO) 7.5-325 MG tablet Take 1 tablet by mouth every 6 (six) hours as needed for moderate pain.  Marland Kitchen  oxaprozin (DAYPRO) 600 MG tablet TAKE 1 TABLET(600 MG) BY MOUTH DAILY  . potassium citrate (UROCIT-K) 10 MEQ (1080 MG) SR tablet Take 2 tablets (20 mEq total) by mouth daily.  . [DISCONTINUED] cephALEXin (KEFLEX) 500 MG capsule Take 1 capsule (500 mg total) by mouth 2 (two) times daily.  . [DISCONTINUED] doxycycline (VIBRA-TABS) 100 MG tablet Take 1 tablet (100 mg total) by mouth 2 (two) times daily.  . [DISCONTINUED] nitrofurantoin (MACRODANTIN) 50 MG capsule TAKE 1 CAPSULE BY MOUTH DAILY    Review of Systems  Constitutional: Negative for appetite change, chills, fatigue and fever.  Respiratory: Negative for chest tightness and shortness of breath.   Cardiovascular: Negative for chest pain and palpitations.  Gastrointestinal: Negative for abdominal pain, nausea and vomiting.  Genitourinary: Positive for frequency and hematuria. Negative for decreased urine volume, difficulty urinating, dysuria, urgency, vaginal bleeding, vaginal discharge and vaginal pain.       Urine has odor  Neurological: Negative for dizziness and weakness.    Social History  Substance Use Topics  . Smoking status: Former Smoker    Years: 20.00    Quit date: 09/09/1989  . Smokeless tobacco: Never Used     Comment: QUIT IN 47  . Alcohol use No   Objective:   BP 124/86   Pulse (!) 110   Temp 98.8 F (37.1 C) (Oral)   Resp 18   Wt 151 lb (  68.5 kg)   SpO2 96% Comment: room air  BMI 25.92 kg/m   Physical Exam  Constitutional: She is oriented to person, place, and time. She appears well-developed and well-nourished. No distress.  HENT:  Head: Normocephalic and atraumatic.  Right Ear: Hearing normal.  Left Ear: Hearing normal.  Nose: Nose normal.  Eyes: Conjunctivae and lids are normal. Right eye exhibits no discharge. Left eye exhibits no discharge. No scleral icterus.  Cardiovascular: Regular rhythm and normal heart sounds.   Slight tachycardic.  Pulmonary/Chest: Effort normal and breath sounds  normal. No respiratory distress.  Abdominal: Soft. Bowel sounds are normal. There is tenderness.  Mild suprapubic discomfort.  Musculoskeletal: She exhibits deformity.  RA deformities of hands/fingers. Stiff hips, knees and ankles. Scars well healed from replacements of hips and knees.  Neurological: She is alert and oriented to person, place, and time.  Skin: Skin is intact. No lesion and no rash noted.  Psychiatric: She has a normal mood and affect. Her speech is normal and behavior is normal. Thought content normal.      Assessment & Plan:     1. Chronic radiation cystitis Had culture identify Proteus Mirabilis on 03-14-16 that was resistant to tetracycline and nitrofurantoin. Was getting better on Keflex but symptoms have returned over the past few days. Will get repeat C&S and refill Keflex. May need recheck with [facility] (Dr. Mitzi Hansen [phone]) pending culture report. - Urine culture - POCT Urinalysis Dipstick - cephALEXin (KEFLEX) 500 MG capsule; Take 1 capsule (500 mg total) by mouth 2 (two) times daily.  Dispense: 14 capsule; Refill: 0  2. Cystitis Acute onset over the past few days with some hematuria. Increase fluid intake and start antibiotic. May need prophylactic antibiotic daily.  3. Rheumatoid arthritis involving multiple sites with positive rheumatoid factor (HCC) Chronic pain in multiple joints. Still using NSAID and occasionally need Norco. Will refill Norco (used 30 tablets in the past 30 days). - HYDROcodone-acetaminophen (NORCO) 7.5-325 MG tablet; Take 1 tablet by mouth every 6 (six) hours as needed for moderate pain.  Dispense: 30 tablet; Refill: [place], PA  [facility]

## 2016-04-09 LAB — URINE CULTURE

## 2016-04-13 ENCOUNTER — Telehealth: Payer: Self-pay

## 2016-04-13 MED ORDER — CIPROFLOXACIN HCL 500 MG PO TABS: 500.0000 mg | ORAL_TABLET | Freq: Two times a day (BID) | ORAL | 0 refills | Status: DC

## 2016-04-13 NOTE — Telephone Encounter (Signed)
Pt advised.  RX sent to [facility] in [place].   Thanks,   -Mickel Baas

## 2016-04-13 NOTE — Telephone Encounter (Signed)
LMTCB 04/13/2016  Thanks,   -Romell Cavanah  

## 2016-04-13 NOTE — Telephone Encounter (Signed)
-----   Message from Margo Common, [place] sent at 04/10/2016  8:24 AM EDT ----- Culture identified two bacteria causing the infection. Both are sensitive to Cipro and questionably sensitive to the Cephalexin given. Recommend Cipro 500 mg BID #28 and recheck urinalysis in 10 days to check for cure.

## 2016-05-03 ENCOUNTER — Encounter: Payer: Self-pay | Admitting: Family Medicine

## 2016-05-07 ENCOUNTER — Encounter: Payer: Self-pay | Admitting: Family Medicine

## 2016-05-08 ENCOUNTER — Other Ambulatory Visit: Payer: Self-pay | Admitting: Family Medicine

## 2016-05-08 DIAGNOSIS — M0579 Rheumatoid arthritis with rheumatoid factor of multiple sites without organ or systems involvement: Secondary | ICD-10-CM

## 2016-05-08 MED ORDER — HYDROCODONE-ACETAMINOPHEN 7.5-325 MG PO TABS
1.0000 | ORAL_TABLET | Freq: Four times a day (QID) | ORAL | 0 refills | Status: DC | PRN
Start: 2016-05-08 — End: 2016-06-01

## 2016-05-08 NOTE — Telephone Encounter (Signed)
Pt called asking if her pain med prescription is ready.  Her call back is [phone]  [facility]

## 2016-05-25 ENCOUNTER — Ambulatory Visit (INDEPENDENT_AMBULATORY_CARE_PROVIDER_SITE_OTHER): Payer: Medicare Other | Admitting: Family Medicine

## 2016-05-25 ENCOUNTER — Encounter: Payer: Self-pay | Admitting: Family Medicine

## 2016-05-25 VITALS — BP 128/90 | HR 84 | Temp 98.3°F | Resp 16 | Wt 150.8 lb

## 2016-05-25 DIAGNOSIS — R030 Elevated blood-pressure reading, without diagnosis of hypertension: Secondary | ICD-10-CM

## 2016-05-25 DIAGNOSIS — IMO0001 Reserved for inherently not codable concepts without codable children: Secondary | ICD-10-CM

## 2016-05-25 DIAGNOSIS — N3040 Irradiation cystitis without hematuria: Secondary | ICD-10-CM

## 2016-05-25 DIAGNOSIS — C539 Malignant neoplasm of cervix uteri, unspecified: Secondary | ICD-10-CM | POA: Diagnosis not present

## 2016-05-25 DIAGNOSIS — M0579 Rheumatoid arthritis with rheumatoid factor of multiple sites without organ or systems involvement: Secondary | ICD-10-CM | POA: Diagnosis not present

## 2016-05-25 NOTE — Progress Notes (Signed)
Patient: Jeanne [name] Female    DOB: 1950-12-04   65 y.o.   MRN: [id number] Visit Date: 05/25/2016  Today's Provider: [name], PA   Chief Complaint  Patient presents with  . Hypertension  . Cancer   Subjective:    HPI  Hypertension, follow-up:  BP Readings from Last 3 Encounters:  05/25/16 128/90  04/06/16 124/86  03/14/16 140/90    She was last seen for hypertension 2 months ago.  BP at that visit was 124/86. Management changes since that visit include none. She reports good compliance with treatment. She is not having side effects.  She is not exercising. She is not adherent to low salt diet.   Outside blood pressures are not being checked. She is experiencing none.  Patient denies none.   Cardiovascular risk factors include none.  Use of agents associated with hypertension: none.     Weight trend: stable Wt Readings from Last 3 Encounters:  05/25/16 150 lb 12.8 oz (68.4 kg)  04/06/16 151 lb (68.5 kg)  03/14/16 152 lb (68.9 kg)    Current diet: well balanced  ------------------------------------------------------------------------ Past Medical History:  Diagnosis Date  . Arthritis   . Hypertension    Patient Active Problem List   Diagnosis Date Noted  . Chronic radiation cystitis 02/28/2015  . Coitalgia 02/28/2015  . Blood pressure elevated 02/28/2015  . BP (high blood pressure) 02/28/2015  . Calculus of kidney 02/28/2015  . Arthritis or polyarthritis, rheumatoid ([place]) 02/28/2015  . Basal cell papilloma 02/28/2015  . H/O cataract extraction 09/22/2014  . Combined form of senile cataract 07/07/2014  . NS (nuclear sclerosis) 05/21/2014  . History of repair of hip joint 03/16/2013  . H/O total knee replacement 03/16/2013  . 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 ABDOMINAL HYSTERECTOMY  09/10/2003  . TOTAL HIP ARTHROPLASTY Right 1981  . TOTAL HIP ARTHROPLASTY Left 1990    . TOTAL KNEE ARTHROPLASTY Right    Family History  Problem Relation Age of Onset  . Breast cancer Mother   . Hypertension Father   . Cancer Father    Allergies  Allergen Reactions  . Codeine Nausea Only and Nausea And Vomiting  . Sulfa Antibiotics     Mouth ulcers     Previous Medications   HYDROCODONE-ACETAMINOPHEN (NORCO) 7.5-325 MG TABLET    Take 1 tablet by mouth every 6 (six) hours as needed for moderate pain.   OXAPROZIN (DAYPRO) 600 MG TABLET    TAKE 1 TABLET(600 MG) BY MOUTH DAILY   POTASSIUM CITRATE (UROCIT-K) 10 MEQ (1080 MG) SR TABLET    Take 2 tablets (20 mEq total) by mouth daily.    Review of Systems  Constitutional: Negative.   HENT: Negative.   Respiratory: Negative.   Cardiovascular: Negative.   Genitourinary: Positive for frequency and urgency.       Only in early mornings as advised by [facility] urologist.  Musculoskeletal: Positive for arthralgias, back pain, gait problem, joint swelling, myalgias and neck stiffness.       Secondary to severe RA.   Social History  Substance Use Topics  . Smoking status: Former Smoker    Years: 20.00    Quit date: 09/09/1989  . Smokeless tobacco: Never Used     Comment: QUIT IN 72  . Alcohol use No   Objective:   BP 128/90 (BP Location: Right Arm, Patient Position: Sitting, Cuff Size: Normal)   Pulse 84  Temp 98.3 F (36.8 C) (Oral)   Resp 16   Wt 150 lb 12.8 oz (68.4 kg)   BMI 25.88 kg/m   Physical Exam  Constitutional: She is oriented to person, place, and time. She appears well-developed and well-nourished. No distress.  HENT:  Head: Normocephalic and atraumatic.  Right Ear: Hearing normal.  Left Ear: Hearing normal.  Nose: Nose normal.  Eyes: Conjunctivae and lids are normal. Right eye exhibits no discharge. Left eye exhibits no discharge. No scleral icterus.  Neck: No thyromegaly present.  Stiff neck.  Cardiovascular: Normal rate and regular rhythm.   Pulmonary/Chest: Effort normal and breath sounds  normal. No respiratory distress.  Abdominal: Soft. Bowel sounds are normal.  Well healed transverse scar in lower abdomen from hysterectomy for cervical cancer.   Musculoskeletal:  Gait and Station- Slow stiff tottering gait. Spine, Ribs and Pelvis- Some discomfort in lumbar spine. Mild tenderness without deformities. Very stiff and limited ROM. Right Upper Extremity- Ulnar deviation of fingers at MCP. Very limited ability to grip. Unable to bend finger joints. Stiffness of shoulders and elbows. Left Upper Extremity- Ulnar deviation of MCP joints with straight stiff fingers. Unable to flex into a fist and tender to palpate. Stiffness of shoulders and elbows. Wrists stiff and slightly enlarged. Right Lower Extremity- Hip very stiff with history of joint replacement and knee replacement. Tender to palpate. Left Lower Extremity- Decrease in hip ROM. Pain with crepitus of the left knee. Decrease ROM (flex only to 80 degrees and extend to 170 degrees). Prominent joint with large scar from past surgery to clean up arthritis and spurs in the 1980's. Upper Extremity- Nodules on elbows. Ulnar deviation of fingers - both hands - with large MCP joints. Extremely diminished ability to flex or extend fingers  Neurological: She is alert and oriented to person, place, and time.  Skin: Skin is intact. No lesion and no rash noted.  Psychiatric: She has a normal mood and affect. Her speech is normal and behavior is normal. Thought content normal.      Assessment & Plan:     1. 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 Depth of invasion was in the inner third of the cervix. Parametria and margins were uninvolved. Lymph nodes were negative. Biopsy of a vaginal lesion on 04/12/04 revealed recurrent carcinoma. The patient  then completed whole pelvis radiation and weekly Cisplatin as well as a Syed implant on 07/12/04. Has vaginal stenosis and chronic cystitis complications.  2. Chronic radiation cystitis Frequent dysuria and UTI's secondary to radiation treatments in 2005. Followed by Dr. Terance Hart at [facility].  3. Blood pressure elevated Episode of elevated BP with headache in December 2011 that was treated with Micardis for a month. BP back to normal the next month and no longer on antihypertensive medications.  4. Rheumatoid arthritis involving multiple sites with positive rheumatoid factor Chronic multiple joint pains, deformities, joint replacements and nodules. Pain controlled with Oxaprozin 600 mg qd and Norco 7.5/325 mg 1 tablet up to QID prn (rarely uses one daily).

## 2016-05-29 ENCOUNTER — Encounter: Payer: Self-pay | Admitting: Family Medicine

## 2016-06-01 ENCOUNTER — Other Ambulatory Visit: Payer: Self-pay | Admitting: Family Medicine

## 2016-06-01 DIAGNOSIS — M0579 Rheumatoid arthritis with rheumatoid factor of multiple sites without organ or systems involvement: Secondary | ICD-10-CM

## 2016-06-01 MED ORDER — HYDROCODONE-ACETAMINOPHEN 7.5-325 MG PO TABS: 1.0000 | ORAL_TABLET | Freq: Four times a day (QID) | ORAL | 0 refills | Status: DC | PRN

## 2016-06-07 ENCOUNTER — Emergency Department
Admission: EM | Admit: 2016-06-07 | Discharge: 2016-06-08 | Disposition: A | Discharge status: Home / Self Care | Payer: Medicare Other | Attending: Emergency Medicine | Admitting: Emergency Medicine

## 2016-06-07 ENCOUNTER — Encounter: Payer: Self-pay | Admitting: *Deleted

## 2016-06-07 DIAGNOSIS — N23 Unspecified renal colic: Secondary | ICD-10-CM | POA: Insufficient documentation

## 2016-06-07 DIAGNOSIS — R109 Unspecified abdominal pain: Secondary | ICD-10-CM | POA: Diagnosis present

## 2016-06-07 DIAGNOSIS — Z87891 Personal history of nicotine dependence: Secondary | ICD-10-CM | POA: Diagnosis not present

## 2016-06-07 DIAGNOSIS — I10 Essential (primary) hypertension: Secondary | ICD-10-CM | POA: Insufficient documentation

## 2016-06-07 DIAGNOSIS — Z79899 Other long term (current) drug therapy: Secondary | ICD-10-CM | POA: Insufficient documentation

## 2016-06-07 LAB — BASIC METABOLIC PANEL
ANION GAP: 9 (ref 5–15)
BUN: 26 mg/dL — AB (ref 6–20)
CALCIUM: 9.1 mg/dL (ref 8.9–10.3)
CO2: 22 mmol/L (ref 22–32)
CREATININE: 0.92 mg/dL (ref 0.44–1.00)
Chloride: 107 mmol/L (ref 101–111)
GLUCOSE: 153 mg/dL — AB (ref 65–99)
Potassium: 3.8 mmol/L (ref 3.5–5.1)
SODIUM: 138 mmol/L (ref 135–145)

## 2016-06-07 LAB — CBC
HCT: 44.3 % (ref 35.0–47.0)
Hemoglobin: 15.5 g/dL (ref 12.0–16.0)
MCH: 29.9 pg (ref 26.0–34.0)
MCHC: 34.9 g/dL (ref 32.0–36.0)
MCV: 85.7 fL (ref 80.0–100.0)
PLATELETS: 231 10*3/uL (ref 150–440)
RBC: 5.17 MIL/uL (ref 3.80–5.20)
RDW: 13.8 % (ref 11.5–14.5)
WBC: 15.4 10*3/uL — ABNORMAL HIGH (ref 3.6–11.0)

## 2016-06-07 LAB — URINALYSIS COMPLETE WITH MICROSCOPIC (ARMC ONLY)
BILIRUBIN URINE: NEGATIVE
GLUCOSE, UA: NEGATIVE mg/dL
Nitrite: NEGATIVE
Protein, ur: 30 mg/dL — AB
Specific Gravity, Urine: 1.019 (ref 1.005–1.030)
pH: 5 (ref 5.0–8.0)

## 2016-06-07 NOTE — ED Triage Notes (Signed)
Pt to triage via wheelchair.  Pt states she has right flank pain.  Hx of kidney stone.  Pt has nausea.

## 2016-06-08 ENCOUNTER — Emergency Department: Payer: Medicare Other

## 2016-06-08 LAB — HEPATIC FUNCTION PANEL
ALK PHOS: 177 U/L — AB (ref 38–126)
ALT: 109 U/L — ABNORMAL HIGH (ref 14–54)
AST: 84 U/L — ABNORMAL HIGH (ref 15–41)
Albumin: 4.1 g/dL (ref 3.5–5.0)
BILIRUBIN DIRECT: 0.2 mg/dL (ref 0.1–0.5)
BILIRUBIN INDIRECT: 0.9 mg/dL (ref 0.3–0.9)
BILIRUBIN TOTAL: 1.1 mg/dL (ref 0.3–1.2)
Total Protein: 7.6 g/dL (ref 6.5–8.1)

## 2016-06-08 LAB — LIPASE, BLOOD: Lipase: 23 U/L (ref 11–51)

## 2016-06-08 MED ORDER — HYDROMORPHONE HCL 1 MG/ML IJ SOLN
0.5000 mg | Freq: Once | INTRAMUSCULAR | Status: AC
  Administered 2016-06-08: 0.5 mg via INTRAVENOUS
  Filled 2016-06-08: qty 1

## 2016-06-08 MED ORDER — CEFTRIAXONE SODIUM 1 G IJ SOLR
1.0000 g | Freq: Once | INTRAMUSCULAR | Status: AC
  Administered 2016-06-08: 1 g via INTRAVENOUS
  Filled 2016-06-08: qty 10

## 2016-06-08 MED ORDER — OXYCODONE-ACETAMINOPHEN 7.5-325 MG PO TABS: 1.0000 | ORAL_TABLET | ORAL | 0 refills | Status: DC | PRN

## 2016-06-08 MED ORDER — ONDANSETRON HCL 4 MG/2ML IJ SOLN
4.0000 mg | Freq: Once | INTRAMUSCULAR | Status: AC
  Administered 2016-06-08: 4 mg via INTRAVENOUS
  Filled 2016-06-08: qty 2

## 2016-06-08 MED ORDER — CEPHALEXIN 500 MG PO CAPS: 500.0000 mg | ORAL_CAPSULE | Freq: Four times a day (QID) | ORAL | 0 refills | Status: AC

## 2016-06-08 NOTE — Discharge Instructions (Signed)
Take the Percocet 1 pill 4 times a day as needed for pain. If you do not have a lot of pain trying not to use the Percocet. Be careful can make you constipated and sleepy. Do not drive on it. Take the Keflex one pill 4 times a day for the possibility of small bladder infection. Return for worse pain fever vomiting or feeling sicker. Follow-up with the urologist. Call Monday morning to get an appointment in about a week.

## 2016-06-08 NOTE — ED Notes (Signed)
Pt requests [place] to stay at the bedside "for a few minutes," states medication makes her feel "wierd" sometimes.

## 2016-06-08 NOTE — ED Notes (Signed)
Pt assisted to the toliet, gait steady, no difficulties with urination expressed.

## 2016-06-08 NOTE — ED Provider Notes (Signed)
[facility] Emergency Department Provider Note   ____________________________________________   First MD Initiated Contact with Patient 06/07/16 2356     (approximate)  I have reviewed the triage vital signs and the nursing notes.   HISTORY  Chief Complaint Flank Pain   HPI SERINA NICHTER is a 65 y.o. female patient reports history of frequent episodes of renal colic. She had one just a few days ago her for just a few hours and she passed it without any trouble. This pain started about 4:00 today. Right sided feels like her usual renal colic but much more severe than usual. She has nausea no diarrhea pain is severe. Pain is crampy. Patient reports she has a history of radiation cystitis she had cervical cancer and had radiation for it.   Past Medical History:  Diagnosis Date  . Arthritis   . Hypertension     Patient Active Problem List   Diagnosis Date Noted  . Chronic radiation cystitis 02/28/2015  . Coitalgia 02/28/2015  . Blood pressure elevated 02/28/2015  . Calculus of kidney 02/28/2015  . Arthritis or polyarthritis, rheumatoid ([place]) 02/28/2015  . Basal cell papilloma 02/28/2015  . H/O cataract extraction 09/22/2014  . Combined form of senile cataract 07/07/2014  . NS (nuclear sclerosis) 05/21/2014  . History of repair of hip joint 03/16/2013  . H/O total knee replacement 03/16/2013  . 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 ABDOMINAL HYSTERECTOMY  09/10/2003  . TOTAL HIP ARTHROPLASTY Right 1981  . TOTAL HIP ARTHROPLASTY Left 1990  . TOTAL KNEE ARTHROPLASTY Right     Prior to Admission medications   Medication Sig Start Date End Date Taking? Authorizing Provider  cephALEXin (KEFLEX) 500 MG capsule Take 1 capsule (500 mg total) by mouth 4 (four) times daily. 06/08/16 06/18/16  Nena Polio, MD  HYDROcodone-acetaminophen (NORCO) 7.5-325 MG tablet Take 1 tablet by  mouth every 6 (six) hours as needed for moderate pain. 06/01/16   Vickki Muff Chrismon, PA  oxaprozin (DAYPRO) 600 MG tablet TAKE 1 TABLET(600 MG) BY MOUTH DAILY 02/10/16   Birdie Sons, MD  oxyCODONE-acetaminophen (PERCOCET) 7.5-325 MG tablet Take 1 tablet by mouth every 4 (four) hours as needed for severe pain. 06/08/16 06/08/17  Nena Polio, MD  potassium citrate (UROCIT-K) 10 MEQ (1080 MG) SR tablet Take 2 tablets (20 mEq total) by mouth daily. 10/04/15   Vickki Muff Chrismon, PA    Allergies Codeine and Sulfa antibiotics  Family History  Problem Relation Age of Onset  . Breast cancer Mother   . Hypertension Father   . Cancer Father     Social History Social History  Substance Use Topics  . Smoking status: Former Smoker    Years: 20.00    Quit date: 09/09/1989  . Smokeless tobacco: Never Used     Comment: QUIT IN 5  . Alcohol use No    Review of Systems Constitutional: No fever/chills Eyes: No visual changes. ENT: No sore throat. Cardiovascular: Denies chest pain. Respiratory: Denies shortness of breath. Gastrointestinal: See history of present illness. Genitourinary:  dysuria. Musculoskeletal: Negative for back pain. Skin: Negative for rash. Neurological: Negative for headaches, focal weakness or numbness.  10-point ROS otherwise negative.  ____________________________________________   PHYSICAL EXAM:  VITAL SIGNS: ED Triage Vitals  Enc Vitals Group     BP 06/07/16 2138 (!) 146/80     Pulse Rate 06/07/16 2138 89  Resp 06/07/16 2138 18     Temp 06/07/16 2138 98.4 F (36.9 C)     Temp Source 06/07/16 2138 Oral     SpO2 06/07/16 2138 100 %     Weight 06/07/16 2140 142 lb (64.4 kg)     Height 06/07/16 2140 5\' 4"  (1.626 m)     Head Circumference --      Peak Flow --      Pain Score 06/07/16 2140 10     Pain Loc --      Pain Edu? --      Excl. in [place]? --     Constitutional: Alert and oriented. Well appearing Eyes: Conjunctivae are normal. PERRL.  EOMI. Head: Atraumatic. Nose: No congestion/rhinnorhea. Mouth/Throat: Mucous membranes are moist.  Oropharynx non-erythematous. Neck: No stridor.  Cardiovascular: Normal rate, regular rhythm. Grossly normal heart sounds.  Good peripheral circulation. Respiratory: Normal respiratory effort.  No retractions. Lungs CTAB. Gastrointestinal: Soft a she does have some tenderness on the right side in the abdomen and upper and lower abdomen. No distention. No abdominal bruits. No CVA tenderness. Musculoskeletal: No lower extremity tenderness nor edema.  No joint effusions. Neurologic:  Normal speech and language. No gross focal neurologic deficits are appreciated. Patient seems diffusely weak Skin:  Skin is warm, dry and intact. No rash noted.   ____________________________________________   LABS (all labs ordered are listed, but only abnormal results are displayed)  Labs Reviewed  BASIC METABOLIC PANEL - Abnormal; Notable for the following:       Result Value   Glucose, Bld 153 (*)    BUN 26 (*)    All other components within normal limits  CBC - Abnormal; Notable for the following:    WBC 15.4 (*)    All other components within normal limits  URINALYSIS COMPLETEWITH MICROSCOPIC ([facility] ONLY) - Abnormal; Notable for the following:    Color, Urine YELLOW (*)    APPearance CLEAR (*)    Ketones, ur TRACE (*)    Hgb urine dipstick 2+ (*)    Protein, ur 30 (*)    Leukocytes, UA TRACE (*)    Bacteria, UA RARE (*)    Squamous Epithelial / LPF 0-5 (*)    All other components within normal limits  HEPATIC FUNCTION PANEL - Abnormal; Notable for the following:    AST 84 (*)    ALT 109 (*)    Alkaline Phosphatase 177 (*)    All other components within normal limits  URINE CULTURE  LIPASE, BLOOD   ____________________________________________  EKG   ____________________________________________  RADIOLOGY  Study Result   CLINICAL DATA:  65 year old female with right flank  pain.  EXAM: RENAL / URINARY TRACT ULTRASOUND COMPLETE  COMPARISON:  None.  FINDINGS: Right Kidney:  Length: 9.5 cm. The right kidney is mildly atrophic. There is moderate right hydronephrosis. There is a 7 mm calculus in the upper pole of the right kidney. Multiple smaller stones noted in the inferior pole of the right kidney.  Left Kidney:  Length: 11 cm. The left kidney is mildly atrophic. There is severe left hydronephrosis. There is a stone within the left renal pelvis at the ureteropelvic junction measuring approximately 12 x 13 x 14 mm.  Bladder:  The urinary bladder is collapsed and not well visualized.  IMPRESSION: Moderate right and severe left hydronephrosis.  Multiple bilateral renal calculi. The visualized right renal calculi do not appear to be obstructing. Large calculus within the left renal pelvis at the UPJ is  concerning for an obstructing stone.   Electronically Signed   By: Anner Crete M.D.   On: 06/08/2016 02:02    CLINICAL DATA:  Acute onset of right flank pain and nausea. Initial encounter.  EXAM: ABDOMEN - 1 VIEW  COMPARISON:  None.  FINDINGS: The visualized bowel gas pattern is unremarkable. Scattered air and stool filled loops of colon are seen; no abnormal dilatation of small bowel loops is seen to suggest small bowel obstruction. No free intra-abdominal air is identified, though evaluation for free air is limited on a single supine view.  There is chronic deformity about the patient's right hip prosthesis, with associated concrete. The left hip arthroplasty is grossly unremarkable in appearance. Postoperative change is seen about the lower abdomen and pelvis. The visualized lung bases are essentially clear.  IMPRESSION: 1. Unremarkable bowel gas pattern; no free intra-abdominal air seen. Moderate amount of stool noted in the colon. 2. Chronic deformity about the right hip prosthesis, with  associated concrete.   Electronically Signed   By: Garald Balding M.D.   On: 06/08/2016 01:23  ____________________________________________   PROCEDURES  Procedure(s) performed:  Procedures  Critical Care performed:   ____________________________________________   INITIAL IMPRESSION / ASSESSMENT AND PLAN / ED COURSE  Pertinent labs & imaging results that were available during my care of the patient were reviewed by me and considered in my medical decision making (see chart for details).    Clinical Course    Patient's pain is well controlled with the pain medication. Discussed patient with urologist who recommended a dose of IV Rocephin and by mouth antibiotics return for fever vomiting or increased pain. They will follow up outpatient. ____________________________________________   FINAL CLINICAL IMPRESSION(S) / ED DIAGNOSES  Final diagnoses:  Ureteral colic      NEW MEDICATIONS STARTED DURING THIS VISIT:  Discharge Medication List as of 06/08/2016  5:28 AM    START taking these medications   Details  cephALEXin (KEFLEX) 500 MG capsule Take 1 capsule (500 mg total) by mouth 4 (four) times daily., Starting Fri 06/08/2016, Until Mon 06/18/2016, Print    oxyCODONE-acetaminophen (PERCOCET) 7.5-325 MG tablet Take 1 tablet by mouth every 4 (four) hours as needed for severe pain., Starting Fri 06/08/2016, Until Sat 06/08/2017, Print         Note:  This document was prepared using Dragon voice recognition software and may include unintentional dictation errors.    Nena Polio, MD 06/08/16 [phone]

## 2016-06-10 LAB — URINE CULTURE

## 2016-07-03 ENCOUNTER — Other Ambulatory Visit: Payer: Self-pay | Admitting: Family Medicine

## 2016-07-03 ENCOUNTER — Encounter: Payer: Self-pay | Admitting: Family Medicine

## 2016-07-03 DIAGNOSIS — M0579 Rheumatoid arthritis with rheumatoid factor of multiple sites without organ or systems involvement: Secondary | ICD-10-CM

## 2016-07-03 MED ORDER — HYDROCODONE-ACETAMINOPHEN 7.5-325 MG PO TABS: 1.0000 | ORAL_TABLET | Freq: Four times a day (QID) | ORAL | 0 refills | Status: DC | PRN

## 2016-07-03 NOTE — Telephone Encounter (Signed)
Pt advised rx is ready

## 2016-07-30 ENCOUNTER — Other Ambulatory Visit: Payer: Self-pay | Admitting: Family Medicine

## 2016-07-30 ENCOUNTER — Encounter: Payer: Self-pay | Admitting: Family Medicine

## 2016-07-30 DIAGNOSIS — M0579 Rheumatoid arthritis with rheumatoid factor of multiple sites without organ or systems involvement: Secondary | ICD-10-CM

## 2016-07-30 MED ORDER — HYDROCODONE-ACETAMINOPHEN 7.5-325 MG PO TABS: 1.0000 | ORAL_TABLET | Freq: Four times a day (QID) | ORAL | 0 refills | Status: DC | PRN

## 2016-08-14 ENCOUNTER — Encounter: Payer: Self-pay | Admitting: Family Medicine

## 2016-08-14 ENCOUNTER — Other Ambulatory Visit: Payer: Self-pay | Admitting: Family Medicine

## 2016-08-14 MED ORDER — AMOXICILLIN 875 MG PO TABS: 875.0000 mg | ORAL_TABLET | Freq: Two times a day (BID) | ORAL | 0 refills | Status: DC

## 2016-08-22 ENCOUNTER — Encounter: Payer: Self-pay | Admitting: Family Medicine

## 2016-08-25 ENCOUNTER — Other Ambulatory Visit: Payer: Self-pay | Admitting: Family Medicine

## 2016-08-25 DIAGNOSIS — M0579 Rheumatoid arthritis with rheumatoid factor of multiple sites without organ or systems involvement: Secondary | ICD-10-CM

## 2016-08-25 MED ORDER — PREDNISONE 5 MG (21) PO TBPK: 5.0000 mg | ORAL_TABLET | Freq: Every day | ORAL | 0 refills | Status: DC

## 2016-08-30 ENCOUNTER — Other Ambulatory Visit: Payer: Self-pay | Admitting: Physician Assistant

## 2016-08-30 ENCOUNTER — Other Ambulatory Visit: Payer: Self-pay | Admitting: Family Medicine

## 2016-08-30 ENCOUNTER — Encounter: Payer: Self-pay | Admitting: Family Medicine

## 2016-08-30 DIAGNOSIS — M0579 Rheumatoid arthritis with rheumatoid factor of multiple sites without organ or systems involvement: Secondary | ICD-10-CM

## 2016-08-30 MED ORDER — HYDROCODONE-ACETAMINOPHEN 7.5-325 MG PO TABS: 1.0000 | ORAL_TABLET | Freq: Four times a day (QID) | ORAL | 0 refills | Status: DC | PRN

## 2016-08-30 NOTE — Telephone Encounter (Signed)
Last ov 04/06/16. Last filled 09/08/15. Please review. Thank you. sd

## 2016-09-14 ENCOUNTER — Ambulatory Visit (INDEPENDENT_AMBULATORY_CARE_PROVIDER_SITE_OTHER): Payer: Medicare HMO | Admitting: Family Medicine

## 2016-09-14 ENCOUNTER — Encounter: Payer: Self-pay | Admitting: Family Medicine

## 2016-09-14 VITALS — BP 142/94 | HR 109 | Temp 97.8°F | Resp 18 | Wt 154.8 lb

## 2016-09-14 DIAGNOSIS — R350 Frequency of micturition: Secondary | ICD-10-CM

## 2016-09-14 DIAGNOSIS — R300 Dysuria: Secondary | ICD-10-CM | POA: Diagnosis not present

## 2016-09-14 DIAGNOSIS — N3040 Irradiation cystitis without hematuria: Secondary | ICD-10-CM | POA: Diagnosis not present

## 2016-09-14 LAB — POCT URINALYSIS DIPSTICK
Bilirubin, UA: NEGATIVE
Glucose, UA: NEGATIVE
KETONES UA: NEGATIVE
Nitrite, UA: POSITIVE
Urobilinogen, UA: 0.2
pH, UA: 5

## 2016-09-14 MED ORDER — CIPROFLOXACIN HCL 500 MG PO TABS: 500.0000 mg | ORAL_TABLET | Freq: Two times a day (BID) | ORAL | 0 refills | Status: DC

## 2016-09-14 NOTE — Progress Notes (Signed)
   Patient: Jeanne [name] Female    DOB: 1950-11-17   66 y.o.   MRN: [id number] Visit Date: 09/14/2016  Today's Provider: [name], PA   Chief Complaint  Patient presents with  . Urinary Tract Infection   Subjective:    Urinary Tract Infection   This is a new problem. Episode onset: Tuesday. The problem occurs intermittently. The problem has been unchanged. The quality of the pain is described as burning. Associated symptoms include frequency. Associated symptoms comments: Foul odor urine. She has tried nothing for the symptoms.      Previous Medications   HYDROCODONE-ACETAMINOPHEN (NORCO) 7.5-325 MG TABLET    Take 1 tablet by mouth every 6 (six) hours as needed for moderate pain.   OXAPROZIN (DAYPRO) 600 MG TABLET    TAKE 1 TABLET(600 MG) BY MOUTH DAILY   POTASSIUM CITRATE (UROCIT-K) 10 MEQ (1080 MG) SR TABLET    Take 2 tablets (20 mEq total) by mouth daily.    Review of Systems  Constitutional: Negative.   Cardiovascular: Negative.   Genitourinary: Positive for dysuria and frequency.    Social History  Substance Use Topics  . Smoking status: Former Smoker    Years: 20.00    Quit date: 09/09/1989  . Smokeless tobacco: Never Used     Comment: QUIT IN 49  . Alcohol use No   Objective:   BP (!) 142/94 (BP Location: Right Arm, Patient Position: Sitting, Cuff Size: Normal)   Pulse (!) 109   Temp 97.8 F (36.6 C) (Oral)   Resp 18   Wt 154 lb 12.8 oz (70.2 kg)   SpO2 97%   BMI 26.57 kg/m   Physical Exam  Constitutional: She is oriented to person, place, and time. She appears well-developed and well-nourished.  HENT:  Head: Normocephalic.  Neck: Neck supple.  Cardiovascular: Regular rhythm.   Slight tachycardic with infection.  Pulmonary/Chest: Effort normal and breath sounds normal.  Abdominal: Soft. Bowel sounds are normal. There is tenderness.  Suprapubic tenderness to palpation.  Neurological: She is alert and oriented to person, place, and time.     Assessment & Plan:     1. Dysuria Onset 09-11-16 with some "sweats". Suspect acute on chronic cystitis. Should stop Macrobid and start Cipro with positive nitrites on urinalysis. May use Pyridium for discomfort and start Ciprofloxacin 500 mg BID while waiting for C&S. Increase water intake and recheck pending lab report. - POCT Urinalysis Dipstick - Urine culture - ciprofloxacin (CIPRO) 500 MG tablet; Take 1 tablet (500 mg total) by mouth 2 (two) times daily.  Dispense: 20 tablet; Refill: 0  2. Urine frequency Onset with dysuria 09-11-16. Urinalysis showed leukocytes and positive nitrites. Will get urine C&S. - POCT Urinalysis Dipstick - Urine culture  3. Chronic radiation cystitis Onset of acute flare over the past 3-4 days. No hematuria. Associated with radiation treatment of cervical cancer after hysterectomy in 2004.

## 2016-09-16 ENCOUNTER — Encounter: Payer: Self-pay | Admitting: Family Medicine

## 2016-09-16 LAB — URINE CULTURE

## 2016-09-17 ENCOUNTER — Encounter: Payer: Self-pay | Admitting: Family Medicine

## 2016-09-18 ENCOUNTER — Telehealth: Payer: Self-pay | Admitting: Family Medicine

## 2016-09-18 NOTE — Telephone Encounter (Signed)
Pt called to see if urine culture results were back from 09/14/16. Please advise. Thanks TNP

## 2016-09-18 NOTE — Telephone Encounter (Signed)
Jeanne [name] returned patient's call.

## 2016-09-18 NOTE — Telephone Encounter (Signed)
Pt called back wanting to know how long will it take to get the report.  She is just concerned about the infection getting in her blood.  Pt's call back is [phone]  [facility]

## 2016-09-18 NOTE — Telephone Encounter (Signed)
Still waiting on lab report from [place] for sensitivity.

## 2016-09-19 ENCOUNTER — Telehealth: Payer: Self-pay | Admitting: Family Medicine

## 2016-09-19 DIAGNOSIS — R300 Dysuria: Secondary | ICD-10-CM

## 2016-09-19 NOTE — Telephone Encounter (Signed)
Pt called to see if urine results were back. I advised pt Jeanne [name] is out of the office today. Please advise. Thanks TNP

## 2016-09-20 ENCOUNTER — Other Ambulatory Visit: Payer: Self-pay | Admitting: Family Medicine

## 2016-09-20 DIAGNOSIS — R300 Dysuria: Secondary | ICD-10-CM | POA: Diagnosis not present

## 2016-09-20 NOTE — Telephone Encounter (Signed)
Patient advised. Patient will stop by the office today or tomorrow to leave a urine sample.

## 2016-09-20 NOTE — Telephone Encounter (Signed)
-----   Message from Margo [place], [place] sent at 09/20/2016  8:18 AM EST ----- No final report on sensitivity/susceptibility test of urine, yet. Continue antibiotics and recheck microscopic urinalysis in the next 2-3 days.

## 2016-09-21 ENCOUNTER — Telehealth: Payer: Self-pay

## 2016-09-21 LAB — ID 3

## 2016-09-21 MED ORDER — CEFUROXIME AXETIL 500 MG PO TABS: 500.0000 mg | ORAL_TABLET | Freq: Two times a day (BID) | ORAL | 0 refills | Status: DC

## 2016-09-21 NOTE — Telephone Encounter (Signed)
Patient advised[facility] Spoke with [facility] At [facility] and she said per the lab they will be running a sensitivity report for second culture that was sent on 09/20/2016.

## 2016-09-21 NOTE — Telephone Encounter (Signed)
-----   Message from Margo Common, [place] sent at 09/21/2016  1:28 PM EST ----- The original culture now has a third bacteria on it. This may have been contaminated and the second culture is not run yet. Recommend adding an additional antibiotic to the Cipro (Ceftin 500 mg BID #14). Asked for a more comprehensive evaluation of this second specimen. "This has been a laboratory nightmare."

## 2016-09-22 LAB — ID 2

## 2016-09-22 LAB — SPECIMEN STATUS REPORT

## 2016-09-22 LAB — ID AND SUSCEPTIBILITY, URINE

## 2016-09-23 ENCOUNTER — Encounter: Payer: Self-pay | Admitting: Family Medicine

## 2016-09-24 ENCOUNTER — Other Ambulatory Visit: Payer: Self-pay | Admitting: Family Medicine

## 2016-09-24 DIAGNOSIS — M0579 Rheumatoid arthritis with rheumatoid factor of multiple sites without organ or systems involvement: Secondary | ICD-10-CM

## 2016-09-24 MED ORDER — HYDROCODONE-ACETAMINOPHEN 7.5-325 MG PO TABS: 1.0000 | ORAL_TABLET | Freq: Four times a day (QID) | ORAL | 0 refills | Status: DC | PRN

## 2016-09-25 LAB — URINE CULTURE

## 2016-09-28 NOTE — Progress Notes (Signed)
Patient advised and will get in touch with office next week about recheck.  ED

## 2016-10-05 ENCOUNTER — Encounter: Payer: Self-pay | Admitting: Family Medicine

## 2016-10-05 ENCOUNTER — Ambulatory Visit (INDEPENDENT_AMBULATORY_CARE_PROVIDER_SITE_OTHER): Payer: Medicare HMO | Admitting: Family Medicine

## 2016-10-05 VITALS — BP 142/78 | HR 112 | Resp 16 | Wt 155.0 lb

## 2016-10-05 DIAGNOSIS — N390 Urinary tract infection, site not specified: Secondary | ICD-10-CM

## 2016-10-05 DIAGNOSIS — R319 Hematuria, unspecified: Secondary | ICD-10-CM | POA: Diagnosis not present

## 2016-10-05 DIAGNOSIS — N3040 Irradiation cystitis without hematuria: Secondary | ICD-10-CM

## 2016-10-05 LAB — POCT URINALYSIS DIPSTICK
Bilirubin, UA: NEGATIVE
Glucose, UA: NEGATIVE
KETONES UA: NEGATIVE
NITRITE UA: POSITIVE
PH UA: 6
Spec Grav, UA: 1.025
Urobilinogen, UA: 0.2

## 2016-10-05 MED ORDER — DOXYCYCLINE HYCLATE 100 MG PO TABS: 100.0000 mg | ORAL_TABLET | Freq: Two times a day (BID) | ORAL | 0 refills | Status: DC

## 2016-10-05 NOTE — Progress Notes (Signed)
Patient: Jeanne [name] Female    DOB: [birth year]   66 y.o.   MRN: [id number] Visit Date: 10/05/2016  Today's Provider: [name], PA   Chief Complaint  Patient presents with  . Urinary Tract Infection    follow up   Subjective:    HPI Patient comes in today to recheck her urine. Patient was seen in the office on 09/14/2016 and was diagnosed with dysuria. Patient reports that she has finished her antibiotics (Ceftin 500mg  BID for 1 week), but her urine still has a strong odor.   Past Medical History:  Diagnosis Date  . Arthritis   . Hypertension    Patient Active Problem List   Diagnosis Date Noted  . Chronic radiation cystitis 02/28/2015  . Coitalgia 02/28/2015  . Blood pressure elevated 02/28/2015  . Calculus of kidney 02/28/2015  . Arthritis or polyarthritis, rheumatoid ([place]) 02/28/2015  . Basal cell papilloma 02/28/2015  . H/O cataract extraction 09/22/2014  . Combined form of senile cataract 07/07/2014  . NS (nuclear sclerosis) 05/21/2014  . History of repair of hip joint 03/16/2013  . H/O total knee replacement 03/16/2013  . 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 ABDOMINAL HYSTERECTOMY  09/10/2003  . TOTAL HIP ARTHROPLASTY Right 1981  . TOTAL HIP ARTHROPLASTY Left 1990  . TOTAL KNEE ARTHROPLASTY Right    Family History  Problem Relation Age of Onset  . Breast cancer Mother   . Hypertension Father   . Cancer Father     Allergies  Allergen Reactions  . Codeine Nausea Only and Nausea And Vomiting  . Sulfa Antibiotics     Mouth ulcers     Current Outpatient Prescriptions:  .  HYDROcodone-acetaminophen (NORCO) 7.5-325 MG tablet, Take 1 tablet by mouth every 6 (six) hours as needed for moderate pain., Disp: 30 tablet, Rfl: 0 .  oxaprozin (DAYPRO) 600 MG tablet, TAKE 1 TABLET(600 MG) BY MOUTH DAILY, Disp: 30 tablet, Rfl: 5 .  potassium citrate (UROCIT-K) 10 MEQ (1080 MG) SR  tablet, Take 2 tablets (20 mEq total) by mouth daily., Disp: 180 tablet, Rfl: 3 .  cefUROXime (CEFTIN) 500 MG tablet, Take 1 tablet (500 mg total) by mouth 2 (two) times daily with a meal. (Patient not taking: Reported on 10/05/2016), Disp: 14 tablet, Rfl: 0 .  ciprofloxacin (CIPRO) 500 MG tablet, Take 1 tablet (500 mg total) by mouth 2 (two) times daily. (Patient not taking: Reported on 10/05/2016), Disp: 20 tablet, Rfl: 0  Review of Systems  Constitutional: Negative for activity change, appetite change, chills, fatigue, fever and unexpected weight change.  Gastrointestinal: Negative for abdominal pain.  Genitourinary: Positive for dysuria and frequency. Negative for decreased urine volume, difficulty urinating, flank pain, hematuria, pelvic pain, urgency, vaginal bleeding, vaginal discharge and vaginal pain.  Neurological: Negative for light-headedness and headaches.    Social History  Substance Use Topics  . Smoking status: Former Smoker    Years: 20.00    Quit date: 09/09/1989  . Smokeless tobacco: Never Used     Comment: QUIT IN 30  . Alcohol use No   Objective:   BP (!) 142/78 (BP Location: Right Arm, Patient Position: Sitting, Cuff Size: Normal)   Pulse (!) 112   Resp 16   Wt 155 lb (70.3 kg)   SpO2 95%   BMI 26.61 kg/m   Physical Exam  Constitutional: She is oriented to person, place, and time. She appears  well-developed and well-nourished. No distress.  HENT:  Head: Normocephalic and atraumatic.  Right Ear: Hearing normal.  Left Ear: Hearing normal.  Nose: Nose normal.  Eyes: Conjunctivae and lids are normal. Right eye exhibits no discharge. Left eye exhibits no discharge. No scleral icterus.  Pulmonary/Chest: Effort normal. No respiratory distress.  Neurological: She is alert and oriented to person, place, and time.  Skin: Skin is intact. No lesion and no rash noted.  Psychiatric: She has a normal mood and affect. Her speech is normal and behavior is normal. Thought  content normal.      Assessment & Plan:     1. Urinary tract infection with hematuria, site unspecified Culture on 09-20-16 isolate E.coli that was resistant to Nitrofurantoin and Ciprofloxacin/Levofloxacin. Was treated with Ceftin and got better until recurrence of foul odor to urine and dysuria this week. Last culture showed some sensitivity to tetracycline and she is concerned about cost. Will treat with Doxycycline while waiting for the repeat C&S report. Urinalysis showed TNTC RBC's and WBC's. - POCT urinalysis dipstick - Urine culture - CULTURE, URINE COMPREHENSIVE - doxycycline (VIBRA-TABS) 100 MG tablet; Take 1 tablet (100 mg total) by mouth 2 (two) times daily.  Dispense: 20 tablet; Refill: 0  2. Chronic radiation cystitis Chronic recurrent cystitis issues since radiation treatment for cervical cancer after abdominal hysterectomy in 2004. May need recheck with urologist at [facility] if UTI persistent.   [name], PA  [facility]

## 2016-10-08 ENCOUNTER — Telehealth: Payer: Self-pay

## 2016-10-08 LAB — URINE CULTURE

## 2016-10-08 NOTE — Telephone Encounter (Signed)
Advised pt of lab results. Pt verbally acknowledges understanding. FU appointment scheduled. Renaldo Fiddler, CMA

## 2016-10-08 NOTE — Telephone Encounter (Signed)
-----   Message from Margo Common, [place] sent at 10/08/2016  9:18 AM EST ----- Culture confirmed E.coli that is resistant to the Ciprofloxacin and Nitrofurantoin. Still sensitive to the Doxycycline/Tetracycle. Proceed with this antibiotic and recheck urinalysis in a week to be sure we are making progress. If not, it will be time to get back to the urologist.

## 2016-10-15 ENCOUNTER — Ambulatory Visit: Payer: Self-pay | Admitting: Family Medicine

## 2016-10-19 ENCOUNTER — Ambulatory Visit (INDEPENDENT_AMBULATORY_CARE_PROVIDER_SITE_OTHER): Payer: Medicare HMO | Admitting: Family Medicine

## 2016-10-19 ENCOUNTER — Encounter: Payer: Self-pay | Admitting: Family Medicine

## 2016-10-19 VITALS — BP 142/92 | HR 119 | Temp 97.5°F | Resp 18 | Wt 153.4 lb

## 2016-10-19 DIAGNOSIS — R000 Tachycardia, unspecified: Secondary | ICD-10-CM

## 2016-10-19 DIAGNOSIS — R319 Hematuria, unspecified: Secondary | ICD-10-CM | POA: Diagnosis not present

## 2016-10-19 DIAGNOSIS — M0579 Rheumatoid arthritis with rheumatoid factor of multiple sites without organ or systems involvement: Secondary | ICD-10-CM

## 2016-10-19 LAB — POCT URINALYSIS DIPSTICK
Bilirubin, UA: NEGATIVE
Glucose, UA: NEGATIVE
KETONES UA: NEGATIVE
LEUKOCYTES UA: NEGATIVE
Nitrite, UA: NEGATIVE
PH UA: 6
PROTEIN UA: NEGATIVE
SPEC GRAV UA: 1.02
Urobilinogen, UA: 0.2

## 2016-10-19 MED ORDER — METOPROLOL TARTRATE 25 MG PO TABS: 25.0000 mg | ORAL_TABLET | Freq: Two times a day (BID) | ORAL | 3 refills | Status: DC

## 2016-10-19 MED ORDER — HYDROCODONE-ACETAMINOPHEN 7.5-325 MG PO TABS: 1.0000 | ORAL_TABLET | Freq: Four times a day (QID) | ORAL | 0 refills | Status: DC | PRN

## 2016-10-19 NOTE — Progress Notes (Signed)
Patient: Jeanne [name] Female    DOB: 05-26-1951   66 y.o.   MRN: [id number] Visit Date: 10/19/2016  Today's Provider: [name], PA   Chief Complaint  Patient presents with  . Follow-up   Subjective:    HPI Patient is here today to recheck urine. Last OV was 10/05/2016. Urinalysis showed TNTC RBC's and WBC's, and culture confirmed E.coli. Patient started Doxycycline on 10/05/2016. Patient reports good compliance with treatment plan. She reports symptoms improved.  Past Medical History:  Diagnosis Date  . Arthritis   . Hypertension    Patient Active Problem List   Diagnosis Date Noted  . Chronic radiation cystitis 02/28/2015  . Coitalgia 02/28/2015  . Blood pressure elevated 02/28/2015  . Calculus of kidney 02/28/2015  . Arthritis or polyarthritis, rheumatoid ([place]) 02/28/2015  . Basal cell papilloma 02/28/2015  . H/O cataract extraction 09/22/2014  . Combined form of senile cataract 07/07/2014  . NS (nuclear sclerosis) 05/21/2014  . History of repair of hip joint 03/16/2013  . H/O total knee replacement 03/16/2013  . 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 ABDOMINAL HYSTERECTOMY  09/10/2003  . TOTAL HIP ARTHROPLASTY Right 1981  . TOTAL HIP ARTHROPLASTY Left 1990  . TOTAL KNEE ARTHROPLASTY Right    Family History  Problem Relation Age of Onset  . Breast cancer Mother   . Hypertension Father   . Cancer Father    Allergies  Allergen Reactions  . Codeine Nausea Only and Nausea And Vomiting  . Sulfa Antibiotics     Mouth ulcers     Previous Medications   HYDROCODONE-ACETAMINOPHEN (NORCO) 7.5-325 MG TABLET    Take 1 tablet by mouth every 6 (six) hours as needed for moderate pain.   OXAPROZIN (DAYPRO) 600 MG TABLET    TAKE 1 TABLET(600 MG) BY MOUTH DAILY   POTASSIUM CITRATE (UROCIT-K) 10 MEQ (1080 MG) SR TABLET    Take 2 tablets (20 mEq total) by mouth daily.    Review of Systems    Constitutional: Negative.   HENT: Negative.   Respiratory: Negative.   Cardiovascular: Negative.   Gastrointestinal: Negative.   Genitourinary: Negative for difficulty urinating, dysuria, flank pain and urgency.  Musculoskeletal: Positive for arthralgias, back pain, gait problem, joint swelling, myalgias and neck stiffness.  Psychiatric/Behavioral: Negative.     Social History  Substance Use Topics  . Smoking status: Former Smoker    Years: 20.00    Quit date: 09/09/1989  . Smokeless tobacco: Never Used     Comment: QUIT IN 60  . Alcohol use No   Objective:   BP (!) 142/92 (BP Location: Right Arm, Patient Position: Sitting, Cuff Size: Normal)   Pulse (!) 119   Temp 97.5 F (36.4 C) (Oral)   Resp 18   Wt 153 lb 6.4 oz (69.6 kg)   SpO2 95%   BMI 26.33 kg/m   Physical Exam  Constitutional: She is oriented to person, place, and time. She appears well-developed and well-nourished.  HENT:  Head: Normocephalic.  Eyes: Conjunctivae are normal.  Neck: No thyromegaly present.  Stiff neck from arthritis/limited ROM.  Cardiovascular: Normal heart sounds.  Exam reveals no gallop and no friction rub.   No murmur heard. Tachycardia.  Pulmonary/Chest: Effort normal and breath sounds normal.  Abdominal: Soft. Bowel sounds are normal.  Musculoskeletal:  Gait and Station- Slow stiff tottering gait. Spine, Ribs and Pelvis- Some discomfort in lumbar spine. Mild tenderness without deformities.  Very stiff and limited ROM. Right Upper Extremity- Ulnar deviation of fingers at MCP. Very limited ability to grip. Unable to bend finger joints. Stiffness of shoulders and elbows. Left Upper Extremity- Ulnar deviation of MCP joints with straight stiff fingers. Unable to flex into a fist and tender to palpate. Stiffness of shoulders and elbows. Wrists stiff and slightly enlarged. Right Lower Extremity- Hip very stiff with history of joint replacement and knee replacement. Tender to palpate.  Left Lower Extremity- Decrease in hip ROM. Pain with crepitus of the left knee. Decrease ROM (flex only to 80 degrees and extend to 170 degrees). Prominent joint with large scar from past surgery to clean up arthritis and spurs in the 1980's. Upper Extremity- Nodules on elbows. Ulnar deviation of fingers - both hands - with large MCP joints. Extremely diminished ability to flex or extend fingers   Neurological: She is alert and oriented to person, place, and time.      Assessment & Plan:     1. Hematuria, unspecified type No dysuria today since treating UTI with Doxycycline for the past 10 days. Urine culture isolated E.coli that was resistant to the Cipro and Macrobid used previously. Urinalysis shows some residual hematuria that has been common with her chronic radiation cystitis. Continue to drink plenty of fluids and recheck prn. - POCT Urinalysis Dipstick  2. Tachycardia Blood pressure and pulse rate high today. EKG shows low voltage in limb leads, tall complexes V2-V4 and sinus tachycardia. Will treat with Metoprolol and recheck in 2 weeks. May need cardiology referral if persistent to rule out cardiomyopathy secondary to her severe rheumatoid arthritis. - EKG 12-Lead - metoprolol tartrate (LOPRESSOR) 25 MG tablet; Take 1 tablet (25 mg total) by mouth 2 (two) times daily.  Dispense: 60 tablet; Refill: 3  3. Rheumatoid arthritis involving multiple sites with positive rheumatoid factor (HCC) Continues to use Daypro for inflammation and Norco prn pain flares in back and all joints. This regimen helps her be more mobile and controls pain to a manageable level. Refilled the Norco and will recheck prn. - HYDROcodone-acetaminophen (NORCO) 7.5-325 MG tablet; Take 1 tablet by mouth every 6 (six) hours as needed for moderate pain.  Dispense: 30 tablet; Refill: 0

## 2016-10-30 ENCOUNTER — Other Ambulatory Visit: Payer: Self-pay | Admitting: Family Medicine

## 2016-10-30 ENCOUNTER — Encounter: Payer: Self-pay | Admitting: Family Medicine

## 2016-10-30 MED ORDER — NITROFURANTOIN MONOHYD MACRO 100 MG PO CAPS: 100.0000 mg | ORAL_CAPSULE | Freq: Two times a day (BID) | ORAL | 1 refills | Status: DC

## 2016-11-02 ENCOUNTER — Encounter: Payer: Self-pay | Admitting: Family Medicine

## 2016-11-02 ENCOUNTER — Ambulatory Visit (INDEPENDENT_AMBULATORY_CARE_PROVIDER_SITE_OTHER): Payer: Medicare HMO | Admitting: Family Medicine

## 2016-11-02 VITALS — BP 136/82 | HR 100 | Temp 98.2°F | Wt 156.6 lb

## 2016-11-02 DIAGNOSIS — I471 Supraventricular tachycardia: Secondary | ICD-10-CM | POA: Diagnosis not present

## 2016-11-02 NOTE — Progress Notes (Signed)
   Patient: Jeanne [name] Female    DOB: Apr 16, 1951   66 y.o.   MRN: [id number] Visit Date: 11/02/2016  Today's Provider: [name], PA   Chief Complaint  Patient presents with  . Tachycardia  . Follow-up   Subjective:    HPI Tachycardia:  Patient is here for a 2 week follow up. Last OV on 10/19/2016 pulse was 119. Patient was started on Metoprolol 25 mg. Patient reports good compliance with treatment plan. Pulse today is 100.   Past Medical History:  Diagnosis Date  . Arthritis   . Hypertension    Patient Active Problem List   Diagnosis Date Noted  . Chronic radiation cystitis 02/28/2015  . Coitalgia 02/28/2015  . Blood pressure elevated 02/28/2015  . Calculus of kidney 02/28/2015  . Arthritis or polyarthritis, rheumatoid ([place]) 02/28/2015  . Basal cell papilloma 02/28/2015  . H/O cataract extraction 09/22/2014  . Combined form of senile cataract 07/07/2014  . NS (nuclear sclerosis) 05/21/2014  . History of repair of hip joint 03/16/2013  . H/O total knee replacement 03/16/2013  . 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 ABDOMINAL HYSTERECTOMY  09/10/2003  . TOTAL HIP ARTHROPLASTY Right 1981  . TOTAL HIP ARTHROPLASTY Left 1990  . TOTAL KNEE ARTHROPLASTY Right    Family History  Problem Relation Age of Onset  . Breast cancer Mother   . Hypertension Father   . Cancer Father    Allergies  Allergen Reactions  . Codeine Nausea Only and Nausea And Vomiting  . Sulfa Antibiotics     Mouth ulcers     Previous Medications   HYDROCODONE-ACETAMINOPHEN (NORCO) 7.5-325 MG TABLET    Take 1 tablet by mouth every 6 (six) hours as needed for moderate pain.   METOPROLOL TARTRATE (LOPRESSOR) 25 MG TABLET    Take 1 tablet (25 mg total) by mouth 2 (two) times daily.   NITROFURANTOIN, MACROCRYSTAL-MONOHYDRATE, (MACROBID) 100 MG CAPSULE    Take 1 capsule (100 mg total) by mouth 2 (two) times daily.   OXAPROZIN  (DAYPRO) 600 MG TABLET    TAKE 1 TABLET(600 MG) BY MOUTH DAILY   POTASSIUM CITRATE (UROCIT-K) 10 MEQ (1080 MG) SR TABLET    Take 2 tablets (20 mEq total) by mouth daily.    Review of Systems  Constitutional: Negative.   Respiratory: Negative.   Cardiovascular: Negative.     Social History  Substance Use Topics  . Smoking status: Former Smoker    Years: 20.00    Quit date: 09/09/1989  . Smokeless tobacco: Never Used     Comment: QUIT IN 74  . Alcohol use No   Objective:   BP 136/82 (BP Location: Right Arm, Patient Position: Sitting, Cuff Size: Normal)   Pulse 100   Temp 98.2 F (36.8 C) (Oral)   Wt 156 lb 9.6 oz (71 kg)   SpO2 96%   BMI 26.88 kg/m   Physical Exam  Constitutional: She appears well-developed and well-nourished.  HENT:  Head: Normocephalic.  Eyes: Conjunctivae are normal.  Cardiovascular: Normal rate and regular rhythm.   Pulmonary/Chest: Effort normal and breath sounds normal.      Assessment & Plan:     1. Paroxysmal supraventricular tachycardia (HCC) Improved. Heart rate down and BP normal. Tolerating Metoprolol 25 mg BID and recheck at regular follow up in 3 months. Continue present dosage daily.

## 2016-11-08 ENCOUNTER — Encounter: Payer: Self-pay | Admitting: Family Medicine

## 2016-11-12 ENCOUNTER — Encounter: Payer: Self-pay | Admitting: Family Medicine

## 2016-11-12 ENCOUNTER — Other Ambulatory Visit: Payer: Self-pay

## 2016-11-12 DIAGNOSIS — M0579 Rheumatoid arthritis with rheumatoid factor of multiple sites without organ or systems involvement: Secondary | ICD-10-CM

## 2016-11-12 MED ORDER — HYDROCODONE-ACETAMINOPHEN 7.5-325 MG PO TABS: 1.0000 | ORAL_TABLET | Freq: Four times a day (QID) | ORAL | 0 refills | Status: DC | PRN

## 2016-11-16 ENCOUNTER — Encounter: Payer: Self-pay | Admitting: Family Medicine

## 2016-11-16 ENCOUNTER — Other Ambulatory Visit: Payer: Self-pay | Admitting: Family Medicine

## 2016-11-16 MED ORDER — AMOXICILLIN 875 MG PO TABS: 875.0000 mg | ORAL_TABLET | Freq: Two times a day (BID) | ORAL | 0 refills | Status: DC

## 2016-12-03 ENCOUNTER — Encounter: Payer: Self-pay | Admitting: Family Medicine

## 2016-12-04 ENCOUNTER — Other Ambulatory Visit (INDEPENDENT_AMBULATORY_CARE_PROVIDER_SITE_OTHER): Payer: Medicare HMO

## 2016-12-04 ENCOUNTER — Other Ambulatory Visit: Payer: Self-pay | Admitting: Family Medicine

## 2016-12-04 DIAGNOSIS — N3020 Other chronic cystitis without hematuria: Secondary | ICD-10-CM | POA: Diagnosis not present

## 2016-12-04 DIAGNOSIS — N200 Calculus of kidney: Secondary | ICD-10-CM | POA: Diagnosis not present

## 2016-12-04 LAB — POCT URINALYSIS DIPSTICK
BILIRUBIN UA: NEGATIVE
GLUCOSE UA: NEGATIVE
KETONES UA: NEGATIVE
Leukocytes, UA: NEGATIVE
NITRITE UA: POSITIVE
Protein, UA: NEGATIVE
RBC UA: NEGATIVE
Spec Grav, UA: 1.025 (ref 1.030–1.035)
Urobilinogen, UA: 0.2 (ref ?–2.0)
pH, UA: 6.5 (ref 5.0–8.0)

## 2016-12-07 ENCOUNTER — Telehealth: Payer: Self-pay

## 2016-12-07 LAB — CULTURE, URINE COMPREHENSIVE

## 2016-12-07 MED ORDER — DOXYCYCLINE HYCLATE 100 MG PO TABS: 100.0000 mg | ORAL_TABLET | Freq: Two times a day (BID) | ORAL | 0 refills | Status: DC

## 2016-12-07 NOTE — Telephone Encounter (Signed)
Advised patient as below. Medication sent into the pharmacy.

## 2016-12-07 NOTE — Telephone Encounter (Signed)
-----   Message from [place], [place] sent at 12/07/2016  9:55 AM EDT ----- Urine culture isolated E.coli bacteria that is resistant Cipro and MacroBID. Need to go to Doxycycline 100 mg BID #20 since you are allergic to sulfa drugs. Recheck urine in 10 days to test for cure.

## 2016-12-10 ENCOUNTER — Telehealth: Payer: Self-pay | Admitting: Family Medicine

## 2016-12-10 NOTE — Telephone Encounter (Signed)
Called Pt to schedule AWV with [facility], call later in summer- knb

## 2016-12-11 ENCOUNTER — Encounter: Payer: Self-pay | Admitting: Family Medicine

## 2016-12-11 ENCOUNTER — Other Ambulatory Visit: Payer: Self-pay | Admitting: Family Medicine

## 2016-12-11 DIAGNOSIS — M0579 Rheumatoid arthritis with rheumatoid factor of multiple sites without organ or systems involvement: Secondary | ICD-10-CM

## 2016-12-11 MED ORDER — HYDROCODONE-ACETAMINOPHEN 7.5-325 MG PO TABS: 1.0000 | ORAL_TABLET | Freq: Four times a day (QID) | ORAL | 0 refills | Status: DC | PRN

## 2016-12-16 ENCOUNTER — Encounter: Payer: Self-pay | Admitting: Family Medicine

## 2016-12-17 ENCOUNTER — Other Ambulatory Visit: Payer: Self-pay | Admitting: Family Medicine

## 2016-12-17 DIAGNOSIS — R000 Tachycardia, unspecified: Secondary | ICD-10-CM

## 2016-12-17 MED ORDER — METOPROLOL TARTRATE 25 MG PO TABS: 25.0000 mg | ORAL_TABLET | Freq: Two times a day (BID) | ORAL | 3 refills | Status: DC

## 2016-12-17 NOTE — Telephone Encounter (Signed)
[facility] on the following medication.  Thanks CC  metoprolol tartrate (LOPRESSOR) 25 MG tablet  Take 1 Tablet (25 mg total) by mouth 2 (two) times daily.

## 2016-12-28 ENCOUNTER — Other Ambulatory Visit: Payer: Self-pay | Admitting: Family Medicine

## 2016-12-28 ENCOUNTER — Encounter: Payer: Self-pay | Admitting: Family Medicine

## 2016-12-28 MED ORDER — OXAPROZIN 600 MG PO TABS: ORAL_TABLET | ORAL | 5 refills | Status: DC

## 2017-01-08 ENCOUNTER — Encounter: Payer: Self-pay | Admitting: Family Medicine

## 2017-01-08 ENCOUNTER — Other Ambulatory Visit: Payer: Self-pay | Admitting: Family Medicine

## 2017-01-08 DIAGNOSIS — M0579 Rheumatoid arthritis with rheumatoid factor of multiple sites without organ or systems involvement: Secondary | ICD-10-CM

## 2017-01-08 MED ORDER — HYDROCODONE-ACETAMINOPHEN 7.5-325 MG PO TABS: 1.0000 | ORAL_TABLET | Freq: Four times a day (QID) | ORAL | 0 refills | Status: DC | PRN

## 2017-01-18 ENCOUNTER — Encounter: Payer: Self-pay | Admitting: Family Medicine

## 2017-01-22 ENCOUNTER — Encounter: Payer: Self-pay | Admitting: Family Medicine

## 2017-01-25 ENCOUNTER — Other Ambulatory Visit: Payer: Self-pay | Admitting: Family Medicine

## 2017-01-25 ENCOUNTER — Encounter: Payer: Self-pay | Admitting: Family Medicine

## 2017-01-25 DIAGNOSIS — N3040 Irradiation cystitis without hematuria: Secondary | ICD-10-CM

## 2017-01-25 MED ORDER — NITROFURANTOIN MONOHYD MACRO 100 MG PO CAPS: 100.0000 mg | ORAL_CAPSULE | Freq: Two times a day (BID) | ORAL | 1 refills | Status: DC

## 2017-01-27 ENCOUNTER — Encounter: Payer: Self-pay | Admitting: Family Medicine

## 2017-01-28 ENCOUNTER — Telehealth: Payer: Self-pay

## 2017-01-28 ENCOUNTER — Telehealth (INDEPENDENT_AMBULATORY_CARE_PROVIDER_SITE_OTHER): Payer: Medicare HMO

## 2017-01-28 DIAGNOSIS — N200 Calculus of kidney: Secondary | ICD-10-CM | POA: Diagnosis not present

## 2017-01-28 DIAGNOSIS — R300 Dysuria: Secondary | ICD-10-CM | POA: Insufficient documentation

## 2017-01-28 LAB — POCT URINALYSIS DIPSTICK
GLUCOSE UA: NEGATIVE
KETONES UA: NEGATIVE
Nitrite, UA: POSITIVE
PROTEIN UA: NEGATIVE
SPEC GRAV UA: 1.02 (ref 1.010–1.025)
Urobilinogen, UA: 0.2 E.U./dL
pH, UA: 6.5 (ref 5.0–8.0)

## 2017-01-28 NOTE — Telephone Encounter (Signed)
-----   Message from Carmon Ginsberg, [place] sent at 01/28/2017 12:03 PM EDT ----- Let her know she has evidence of infection but would like to wait for urine culture results due to multiple resistances on prior culture.

## 2017-01-28 NOTE — Telephone Encounter (Signed)
Patient has been advised. KW 

## 2017-01-28 NOTE — Telephone Encounter (Signed)
Pt's husband dropped off urine sample for UA dipstick and culture. See pt email. Renaldo Fiddler, CMA

## 2017-01-30 ENCOUNTER — Telehealth: Payer: Self-pay

## 2017-01-30 LAB — URINE CULTURE

## 2017-01-30 NOTE — Telephone Encounter (Signed)
-----   Message from Carmon Ginsberg, [place] sent at 01/30/2017  7:33 AM EDT ----- No significant infection detected. Is she still having symptoms?

## 2017-01-30 NOTE — Telephone Encounter (Signed)
Patient advised as below. Patient reports that she is still having painful urination, pt denies any other symptoms.

## 2017-01-30 NOTE — Telephone Encounter (Signed)
Patient was advised she declined to see urologist, patient states that she will call back at another time to arrange appt with Simona Huh. KW

## 2017-01-30 NOTE — Telephone Encounter (Signed)
Would she like to see a urologist? May try otc URIstat or AZO  for her symptoms. Would f/u with Simona Huh Thursday or Friday if she prefers

## 2017-02-04 ENCOUNTER — Encounter: Payer: Self-pay | Admitting: Family Medicine

## 2017-02-05 ENCOUNTER — Other Ambulatory Visit: Payer: Self-pay | Admitting: Family Medicine

## 2017-02-05 DIAGNOSIS — N3040 Irradiation cystitis without hematuria: Secondary | ICD-10-CM

## 2017-02-05 DIAGNOSIS — M0579 Rheumatoid arthritis with rheumatoid factor of multiple sites without organ or systems involvement: Secondary | ICD-10-CM

## 2017-02-05 MED ORDER — HYDROCODONE-ACETAMINOPHEN 7.5-325 MG PO TABS: 1.0000 | ORAL_TABLET | Freq: Four times a day (QID) | ORAL | 0 refills | Status: DC | PRN

## 2017-02-05 MED ORDER — DOXYCYCLINE HYCLATE 100 MG PO TABS: 100.0000 mg | ORAL_TABLET | Freq: Two times a day (BID) | ORAL | 0 refills | Status: DC

## 2017-02-06 ENCOUNTER — Encounter: Payer: Self-pay | Admitting: Family Medicine

## 2017-03-03 ENCOUNTER — Encounter: Payer: Self-pay | Admitting: Family Medicine

## 2017-03-05 ENCOUNTER — Other Ambulatory Visit: Payer: Self-pay | Admitting: Family Medicine

## 2017-03-05 ENCOUNTER — Encounter: Payer: Self-pay | Admitting: Family Medicine

## 2017-03-05 DIAGNOSIS — M0579 Rheumatoid arthritis with rheumatoid factor of multiple sites without organ or systems involvement: Secondary | ICD-10-CM

## 2017-03-05 MED ORDER — HYDROCODONE-ACETAMINOPHEN 7.5-325 MG PO TABS: 1.0000 | ORAL_TABLET | Freq: Four times a day (QID) | ORAL | 0 refills | Status: DC | PRN

## 2017-03-09 ENCOUNTER — Encounter: Payer: Self-pay | Admitting: Family Medicine

## 2017-03-11 ENCOUNTER — Other Ambulatory Visit: Payer: Self-pay | Admitting: Family Medicine

## 2017-03-11 DIAGNOSIS — N3040 Irradiation cystitis without hematuria: Secondary | ICD-10-CM

## 2017-03-11 MED ORDER — NITROFURANTOIN MONOHYD MACRO 100 MG PO CAPS: ORAL_CAPSULE | ORAL | 3 refills | Status: DC

## 2017-03-11 NOTE — Telephone Encounter (Signed)
[facility]

## 2017-03-11 NOTE — Telephone Encounter (Signed)
Pt stated that she is completely out of nitrofurantoin, macrocrystal-monohydrate, (MACROBID) 100 MG capsule and it was supposed to have been sent to [facility] not mail order. [facility] is [facility]. Please advise. Thanks TNP

## 2017-03-29 ENCOUNTER — Encounter: Payer: Self-pay | Admitting: Family Medicine

## 2017-03-29 ENCOUNTER — Other Ambulatory Visit: Payer: Self-pay | Admitting: Family Medicine

## 2017-03-29 DIAGNOSIS — M0579 Rheumatoid arthritis with rheumatoid factor of multiple sites without organ or systems involvement: Secondary | ICD-10-CM

## 2017-03-29 MED ORDER — HYDROCODONE-ACETAMINOPHEN 7.5-325 MG PO TABS: 1.0000 | ORAL_TABLET | Freq: Four times a day (QID) | ORAL | 0 refills | Status: DC | PRN

## 2017-04-02 ENCOUNTER — Encounter: Payer: Self-pay | Admitting: Family Medicine

## 2017-04-19 ENCOUNTER — Encounter: Payer: Self-pay | Admitting: Family Medicine

## 2017-04-19 ENCOUNTER — Other Ambulatory Visit: Payer: Self-pay | Admitting: Family Medicine

## 2017-04-19 DIAGNOSIS — M0579 Rheumatoid arthritis with rheumatoid factor of multiple sites without organ or systems involvement: Secondary | ICD-10-CM

## 2017-04-19 MED ORDER — HYDROCODONE-ACETAMINOPHEN 7.5-325 MG PO TABS: 1.0000 | ORAL_TABLET | Freq: Four times a day (QID) | ORAL | 0 refills | Status: DC | PRN

## 2017-04-23 ENCOUNTER — Encounter: Payer: Self-pay | Admitting: Family Medicine

## 2017-04-23 ENCOUNTER — Ambulatory Visit (INDEPENDENT_AMBULATORY_CARE_PROVIDER_SITE_OTHER): Payer: Medicare HMO | Admitting: Family Medicine

## 2017-04-23 VITALS — BP 118/82 | HR 77 | Temp 98.2°F | Ht 63.0 in | Wt 154.0 lb

## 2017-04-23 DIAGNOSIS — Z0000 Encounter for general adult medical examination without abnormal findings: Secondary | ICD-10-CM | POA: Diagnosis not present

## 2017-04-23 DIAGNOSIS — M0579 Rheumatoid arthritis with rheumatoid factor of multiple sites without organ or systems involvement: Secondary | ICD-10-CM

## 2017-04-23 DIAGNOSIS — N3040 Irradiation cystitis without hematuria: Secondary | ICD-10-CM | POA: Diagnosis not present

## 2017-04-23 DIAGNOSIS — N200 Calculus of kidney: Secondary | ICD-10-CM | POA: Diagnosis not present

## 2017-04-23 NOTE — Progress Notes (Signed)
Patient: Jeanne [name], Female    DOB: 1951/02/01, 66 y.o.   MRN: [id number] Visit Date: 04/23/2017  Today's Provider: [name], PA   Chief Complaint  Patient presents with  . Medicare Wellness   Subjective:    Annual wellness visit Jeanne [name] is a 65 y.o. female who presents today for her Subsequent Annual Wellness Visit. She feels fairly well. She reports exercising none. She reports she is sleeping fair, she is easily awoken at night.   -----------------------------------------------------------   Review of Systems  Constitutional: Negative.   HENT: Negative.   Eyes: Negative.   Respiratory: Negative.   Cardiovascular: Negative.   Gastrointestinal: Negative.   Endocrine: Negative.   Genitourinary: Positive for vaginal pain.  Musculoskeletal: Positive for arthralgias and neck pain.  Skin: Negative.   Allergic/Immunologic: Negative.   Hematological: Negative.   Psychiatric/Behavioral: Negative.     Social History   Social History  . Marital status: Married    Spouse name: N/A  . Number of children: N/A  . Years of education: N/A   Occupational History  . Not on file.   Social History Main Topics  . Smoking status: Former Smoker    Years: 20.00    Quit date: 09/09/1989  . Smokeless tobacco: Never Used     Comment: QUIT IN 1  . Alcohol use No  . Drug use: No  . Sexual activity: Not on file   Other Topics Concern  . Not on file   Social History Narrative  . No narrative on file    Patient Active Problem List   Diagnosis Date Noted  . Dysuria 01/28/2017  . Chronic radiation cystitis 02/28/2015  . Coitalgia 02/28/2015  . Blood pressure elevated 02/28/2015  . Calculus of kidney 02/28/2015  . Arthritis or polyarthritis, rheumatoid ([place]) 02/28/2015  . Basal cell papilloma 02/28/2015  . H/O cataract extraction 09/22/2014  . Combined form of senile cataract 07/07/2014  . NS (nuclear sclerosis) 05/21/2014  . History of repair of hip  joint 03/16/2013  . H/O total knee replacement 03/16/2013  . 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 ABDOMINAL HYSTERECTOMY  09/10/2003  . TOTAL HIP ARTHROPLASTY Right 1981  . TOTAL HIP ARTHROPLASTY Left 1990  . TOTAL KNEE ARTHROPLASTY Right     Her family history includes Breast cancer in her mother; Cancer in her father; Hypertension in her father.     Previous Medications   HYDROCODONE-ACETAMINOPHEN (NORCO) 7.5-325 MG TABLET    Take 1 tablet by mouth every 6 (six) hours as needed for moderate pain.   METOPROLOL TARTRATE (LOPRESSOR) 25 MG TABLET    Take 1 tablet (25 mg total) by mouth 2 (two) times daily.   OXAPROZIN (DAYPRO) 600 MG TABLET    TAKE 1 TABLET(600 MG) BY MOUTH DAILY   POTASSIUM CITRATE (UROCIT-K) 10 MEQ (1080 MG) SR TABLET    Take 2 tablets (20 mEq total) by mouth daily.    Patient Care Team: Chrismon, Vickki Muff, PA as PCP - General (Physician Assistant)      Objective:   Vitals: BP 118/82 (BP Location: Right Arm, Patient Position: Sitting, Cuff Size: Normal)   Pulse 77   Temp 98.2 F (36.8 C) (Oral)   Ht 5\' 3"  (1.6 m)   Wt 154 lb (69.9 kg)   SpO2 99%   BMI 27.28 kg/m   Physical Exam  Constitutional: She is oriented to person, place, and time. She appears  well-developed and well-nourished. No distress.  HENT:  Head: Normocephalic and atraumatic.  Right Ear: Hearing normal.  Left Ear: Hearing normal.  Nose: Nose normal.  Eyes: Conjunctivae and lids are normal. Right eye exhibits no discharge. Left eye exhibits no discharge. No scleral icterus.  Neck: Neck supple.  Cardiovascular: Regular rhythm.   Pulmonary/Chest: Effort normal and breath sounds normal. No respiratory distress.  Abdominal: Soft. Bowel sounds are normal.  Musculoskeletal:  Gait and Station- Slow stiff tottering gait. Spine, Ribs and Pelvis- Some discomfort in lumbar spine. Mild tenderness without deformities. Very  stiff and limited ROM. Right Upper Extremity- Ulnar deviation of fingers at MCP. Very limited ability to grip. Unable to bend finger joints. Stiffness of shoulders and elbows. Left Upper Extremity- Ulnar deviation of MCP joints with straight stiff fingers. Unable to flex into a fist and tender to palpate. Stiffness of shoulders and elbows. Wrists stiff and slightly enlarged. Right Lower Extremity- Hip very stiff with history of joint replacement and knee replacement. Tender to palpate. Left Lower Extremity- Decrease in hip ROM. Pain with crepitus of the left knee. Decrease ROM (flex only to 80 degrees and extend to 170 degrees). Prominent joint with large scar from past surgery to clean up arthritis and spurs in the 1980's. Upper Extremity- Nodules on elbows. Ulnar deviation of fingers - both hands - with large MCP joints. Extremely diminished ability to flex or extend fingers.   Neurological: She is alert and oriented to person, place, and time.  Skin: Skin is intact. No lesion and no rash noted.  Psychiatric: She has a normal mood and affect. Her speech is normal and behavior is normal. Thought content normal.    Activities of Daily Living In your present state of health, do you have any difficulty performing the following activities: 04/23/2017  Hearing? N  Vision? N  Difficulty concentrating or making decisions? N  Walking or climbing stairs? Y  Dressing or bathing? N  Doing errands, shopping? Y  Some recent data might be hidden    Fall Risk Assessment Fall Risk  04/23/2017  Falls in the past year? No     Depression Screen PHQ 2/9 Scores 04/23/2017  PHQ - 2 Score 1  PHQ- 9 Score 4    Cognitive Testing - 6-CIT  Correct? Score   What year is it? yes 0 0 or 4  What month is it? yes 0 0 or 3  Memorize:    Pia Mau,  44,  [address]  [place],      What time is it? (within 1 hour) yes 0 0 or 3  Count backwards from 20 yes 0 0, 2, or 4  Name the months of the year yes 0  0, 2, or 4  Repeat name & address above yes 4 0, 2, 4, 6, 8, or 10       TOTAL SCORE  4/28   Interpretation:  Normal  Normal (0-7) Abnormal (8-28)   Assessment & Plan:     Annual Wellness Visit  Reviewed patient's Family Medical History Reviewed and updated list of patient's medical providers Assessment of cognitive impairment was done Assessed patient's functional ability Established a written schedule for health screening [place] Completed and Reviewed  Exercise Activities and Dietary recommendations Goals    Unable to exercise except some mild walking due to severe RA with many stiff joints and joint replacements.      Health Maintenance  Topic Date Due  . HIV Screening  12/26/1967  . TETANUS/TDAP  12/26/1971  . PAP SMEAR  12/25/1973  . MAMMOGRAM  12/26/2002  . COLONOSCOPY  12/26/2002  . INFLUENZA VACCINE  04/10/2017  . Hepatitis C Screening  Completed     Discussed health benefits of physical activity, and encouraged her to engage in regular exercise appropriate for her age and condition.    ------------------------------------------------------------------------------------------------------------ 1. Encounter for Medicare annual wellness exam General health stable with severe RA. Recheck labs. Had cervical cancer requiring hysterectomy with radiation treatment and Cisplatin. Has chronic cystitis. Follow up appointment pending reports. - CBC with Differential/Platelet - Comprehensive metabolic panel - Lipid panel - TSH  2. Rheumatoid arthritis involving multiple sites with positive rheumatoid factor (HCC) Has had seropositive RA all her life. Still having pain in nearly all joints.  Had Right hip replaced in 1982, left hip in 1990, and right knee replacement in 1990. Still has decreased grip with swan-neck deformities of all fingers, nodules on both elbows, neck stiffness, weak grip and generalized stiffness. Uses Daypro for inflammation and  Norco prn pain spikes that limits ability to carry out ADL's. Recheck labs. - CBC with Differential/Platelet - Comprehensive metabolic panel  3. Chronic radiation cystitis Still has some bladder spasms. AZO-Standard helpful with bladder pains. No burning or fever. Recheck CBC with diff and CMP. - CBC with Differential/Platelet - Comprehensive metabolic panel  4. Calculus of kidney Brought in a bag of stones passed over the past couple years. Recheck CMP.  - Comprehensive metabolic panel

## 2017-05-06 DIAGNOSIS — M0579 Rheumatoid arthritis with rheumatoid factor of multiple sites without organ or systems involvement: Secondary | ICD-10-CM | POA: Diagnosis not present

## 2017-05-06 DIAGNOSIS — N3040 Irradiation cystitis without hematuria: Secondary | ICD-10-CM | POA: Diagnosis not present

## 2017-05-06 DIAGNOSIS — Z0000 Encounter for general adult medical examination without abnormal findings: Secondary | ICD-10-CM | POA: Diagnosis not present

## 2017-05-06 DIAGNOSIS — N200 Calculus of kidney: Secondary | ICD-10-CM | POA: Diagnosis not present

## 2017-05-07 LAB — COMPREHENSIVE METABOLIC PANEL
ALT: 39 IU/L — AB (ref 0–32)
AST: 36 IU/L (ref 0–40)
Albumin/Globulin Ratio: 1.4 (ref 1.2–2.2)
Albumin: 4.1 g/dL (ref 3.6–4.8)
Alkaline Phosphatase: 193 IU/L — ABNORMAL HIGH (ref 39–117)
BILIRUBIN TOTAL: 0.7 mg/dL (ref 0.0–1.2)
BUN/Creatinine Ratio: 16 (ref 12–28)
BUN: 13 mg/dL (ref 8–27)
CHLORIDE: 106 mmol/L (ref 96–106)
CO2: 19 mmol/L — AB (ref 20–29)
CREATININE: 0.83 mg/dL (ref 0.57–1.00)
Calcium: 9.4 mg/dL (ref 8.7–10.3)
GFR calc Af Amer: 86 mL/min/{1.73_m2} (ref >59)
GFR calc non Af Amer: 75 mL/min/{1.73_m2} (ref >59)
GLUCOSE: 102 mg/dL — AB (ref 65–99)
Globulin, Total: 3 g/dL (ref 1.5–4.5)
Potassium: 4.1 mmol/L (ref 3.5–5.2)
Sodium: 143 mmol/L (ref 134–144)
TOTAL PROTEIN: 7.1 g/dL (ref 6.0–8.5)

## 2017-05-07 LAB — CBC WITH DIFFERENTIAL/PLATELET
BASOS ABS: 0 10*3/uL (ref 0.0–0.2)
Basos: 0 %
EOS (ABSOLUTE): 0.3 10*3/uL (ref 0.0–0.4)
Eos: 4 %
HEMOGLOBIN: 14.6 g/dL (ref 11.1–15.9)
Hematocrit: 44.8 % (ref 34.0–46.6)
IMMATURE GRANULOCYTES: 0 %
Immature Grans (Abs): 0 10*3/uL (ref 0.0–0.1)
LYMPHS: 20 %
Lymphocytes Absolute: 1.6 10*3/uL (ref 0.7–3.1)
MCH: 29 pg (ref 26.6–33.0)
MCHC: 32.6 g/dL (ref 31.5–35.7)
MCV: 89 fL (ref 79–97)
MONOCYTES: 6 %
Monocytes Absolute: 0.5 10*3/uL (ref 0.1–0.9)
NEUTROS ABS: 5.2 10*3/uL (ref 1.4–7.0)
NEUTROS PCT: 70 %
PLATELETS: 303 10*3/uL (ref 150–379)
RBC: 5.04 x10E6/uL (ref 3.77–5.28)
RDW: 13.6 % (ref 12.3–15.4)
WBC: 7.6 10*3/uL (ref 3.4–10.8)

## 2017-05-07 LAB — LIPID PANEL
CHOLESTEROL TOTAL: 183 mg/dL (ref 100–199)
Chol/HDL Ratio: 2.7 ratio (ref 0.0–4.4)
HDL: 67 mg/dL (ref >39)
LDL CALC: 83 mg/dL (ref 0–99)
Triglycerides: 167 mg/dL — ABNORMAL HIGH (ref 0–149)
VLDL CHOLESTEROL CAL: 33 mg/dL (ref 5–40)

## 2017-05-07 LAB — TSH: TSH: 1.68 u[IU]/mL (ref 0.450–4.500)

## 2017-05-13 ENCOUNTER — Encounter: Payer: Self-pay | Admitting: Family Medicine

## 2017-05-14 ENCOUNTER — Other Ambulatory Visit: Payer: Self-pay | Admitting: Family Medicine

## 2017-05-14 DIAGNOSIS — M0579 Rheumatoid arthritis with rheumatoid factor of multiple sites without organ or systems involvement: Secondary | ICD-10-CM

## 2017-05-14 MED ORDER — HYDROCODONE-ACETAMINOPHEN 7.5-325 MG PO TABS: 1.0000 | ORAL_TABLET | Freq: Four times a day (QID) | ORAL | 0 refills | Status: DC | PRN

## 2017-05-15 ENCOUNTER — Other Ambulatory Visit (INDEPENDENT_AMBULATORY_CARE_PROVIDER_SITE_OTHER): Payer: Medicare HMO

## 2017-05-15 ENCOUNTER — Other Ambulatory Visit: Payer: Self-pay | Admitting: Family Medicine

## 2017-05-15 DIAGNOSIS — R350 Frequency of micturition: Secondary | ICD-10-CM | POA: Diagnosis not present

## 2017-05-15 DIAGNOSIS — R300 Dysuria: Secondary | ICD-10-CM | POA: Diagnosis not present

## 2017-05-15 DIAGNOSIS — N390 Urinary tract infection, site not specified: Secondary | ICD-10-CM | POA: Diagnosis not present

## 2017-05-15 LAB — POCT URINALYSIS DIPSTICK
Bilirubin, UA: NEGATIVE
GLUCOSE UA: NEGATIVE
KETONES UA: NEGATIVE
Nitrite, UA: NEGATIVE
SPEC GRAV UA: 1.025 (ref 1.010–1.025)
Urobilinogen, UA: 0.2 E.U./dL
pH, UA: 6 (ref 5.0–8.0)

## 2017-05-15 NOTE — Progress Notes (Signed)
Urine culture ordered for urinary frequency and dysuria okay per Simona Huh.

## 2017-05-16 NOTE — Progress Notes (Signed)
Proceed with urine culture. Urinalysis showed slight trace of WBC's but negative for nitrites. Moderate amount of RBC's. This sounds like the chronic radiation cystitis. The culture will let us know if there is any bacterial growth that needs an antibiotic.

## 2017-05-18 LAB — URINE CULTURE
MICRO NUMBER:: 80974359
SPECIMEN QUALITY:: ADEQUATE

## 2017-05-20 ENCOUNTER — Telehealth: Payer: Self-pay

## 2017-05-20 MED ORDER — CIPROFLOXACIN HCL 500 MG PO TABS: 500.0000 mg | ORAL_TABLET | Freq: Two times a day (BID) | ORAL | 0 refills | Status: DC

## 2017-05-20 NOTE — Telephone Encounter (Signed)
Patient advised.

## 2017-05-20 NOTE — Telephone Encounter (Signed)
LMTCB. Results released to [place]. RX sent to [facility].

## 2017-05-20 NOTE — Telephone Encounter (Signed)
-----   Message from Margo Common, [place] sent at 05/20/2017  8:30 AM EDT ----- Pseudomonas isolated on urine culture. Recommend trying the Ciprofloxacin 500 mg BID #20 and recheck culture for cure in 7-10 days.

## 2017-06-03 ENCOUNTER — Encounter: Payer: Self-pay | Admitting: Family Medicine

## 2017-06-03 ENCOUNTER — Other Ambulatory Visit: Payer: Self-pay | Admitting: Family Medicine

## 2017-06-03 DIAGNOSIS — M0579 Rheumatoid arthritis with rheumatoid factor of multiple sites without organ or systems involvement: Secondary | ICD-10-CM

## 2017-06-03 MED ORDER — HYDROCODONE-ACETAMINOPHEN 7.5-325 MG PO TABS: 1.0000 | ORAL_TABLET | Freq: Four times a day (QID) | ORAL | 0 refills | Status: DC | PRN

## 2017-06-28 ENCOUNTER — Other Ambulatory Visit: Payer: Self-pay | Admitting: Family Medicine

## 2017-06-28 ENCOUNTER — Encounter: Payer: Self-pay | Admitting: Family Medicine

## 2017-06-28 DIAGNOSIS — M0579 Rheumatoid arthritis with rheumatoid factor of multiple sites without organ or systems involvement: Secondary | ICD-10-CM

## 2017-06-28 MED ORDER — HYDROCODONE-ACETAMINOPHEN 7.5-325 MG PO TABS: 1.0000 | ORAL_TABLET | Freq: Four times a day (QID) | ORAL | 0 refills | Status: DC | PRN

## 2017-07-15 ENCOUNTER — Encounter: Payer: Self-pay | Admitting: Family Medicine

## 2017-07-15 DIAGNOSIS — N390 Urinary tract infection, site not specified: Secondary | ICD-10-CM | POA: Insufficient documentation

## 2017-07-20 ENCOUNTER — Encounter: Payer: Self-pay | Admitting: Family Medicine

## 2017-07-22 ENCOUNTER — Other Ambulatory Visit: Payer: Self-pay | Admitting: Family Medicine

## 2017-07-22 DIAGNOSIS — M0579 Rheumatoid arthritis with rheumatoid factor of multiple sites without organ or systems involvement: Secondary | ICD-10-CM

## 2017-07-22 MED ORDER — HYDROCODONE-ACETAMINOPHEN 7.5-325 MG PO TABS: 1.0000 | ORAL_TABLET | Freq: Four times a day (QID) | ORAL | 0 refills | Status: DC | PRN

## 2017-08-02 ENCOUNTER — Other Ambulatory Visit: Payer: Self-pay | Admitting: Family Medicine

## 2017-08-05 ENCOUNTER — Encounter: Payer: Self-pay | Admitting: Family Medicine

## 2017-08-05 NOTE — Telephone Encounter (Signed)
Patient's husband will bring in a urine specimen tomorrow for me to check. Please give him a clean cup to replace his in case it may be needed in the future.

## 2017-08-08 ENCOUNTER — Encounter: Payer: Self-pay | Admitting: Family Medicine

## 2017-08-09 ENCOUNTER — Other Ambulatory Visit: Payer: Self-pay | Admitting: Family Medicine

## 2017-08-09 DIAGNOSIS — M0579 Rheumatoid arthritis with rheumatoid factor of multiple sites without organ or systems involvement: Secondary | ICD-10-CM

## 2017-08-09 MED ORDER — HYDROCODONE-ACETAMINOPHEN 7.5-325 MG PO TABS: 1.0000 | ORAL_TABLET | Freq: Four times a day (QID) | ORAL | 0 refills | Status: DC | PRN

## 2017-08-12 ENCOUNTER — Other Ambulatory Visit: Payer: Self-pay | Admitting: Family Medicine

## 2017-08-12 ENCOUNTER — Other Ambulatory Visit (INDEPENDENT_AMBULATORY_CARE_PROVIDER_SITE_OTHER): Payer: Medicare HMO

## 2017-08-12 DIAGNOSIS — R300 Dysuria: Secondary | ICD-10-CM | POA: Diagnosis not present

## 2017-08-12 LAB — POCT URINALYSIS DIPSTICK
BILIRUBIN UA: NEGATIVE
GLUCOSE UA: NEGATIVE
Ketones, UA: NEGATIVE
LEUKOCYTES UA: NEGATIVE
NITRITE UA: NEGATIVE
Spec Grav, UA: 1.02 (ref 1.010–1.025)
Urobilinogen, UA: 0.2 E.U./dL
pH, UA: 6 (ref 5.0–8.0)

## 2017-08-13 LAB — URINE CULTURE
MICRO NUMBER: 81356502
Result:: NO GROWTH
SPECIMEN QUALITY:: ADEQUATE

## 2017-08-14 ENCOUNTER — Telehealth: Payer: Self-pay | Admitting: Family Medicine

## 2017-08-14 NOTE — Telephone Encounter (Signed)
Pt called to get results to urine culture from 08/12/17. Please advise. Thanks TNP

## 2017-08-15 NOTE — Telephone Encounter (Signed)
Patient advised.

## 2017-08-15 NOTE — Telephone Encounter (Signed)
No bacteria on culture. No infection this time. May stop the antibiotic and use the AZO-Standard for any symptoms. Drink extra fluids to keep system flushed out.

## 2017-08-27 ENCOUNTER — Other Ambulatory Visit (INDEPENDENT_AMBULATORY_CARE_PROVIDER_SITE_OTHER): Payer: Medicare HMO | Admitting: Emergency Medicine

## 2017-08-27 ENCOUNTER — Encounter: Payer: Self-pay | Admitting: Family Medicine

## 2017-08-27 ENCOUNTER — Other Ambulatory Visit: Payer: Self-pay | Admitting: *Deleted

## 2017-08-27 DIAGNOSIS — N3040 Irradiation cystitis without hematuria: Secondary | ICD-10-CM

## 2017-08-27 DIAGNOSIS — N390 Urinary tract infection, site not specified: Secondary | ICD-10-CM

## 2017-08-27 LAB — POCT URINALYSIS DIPSTICK
Bilirubin, UA: NEGATIVE
Glucose, UA: NEGATIVE
KETONES UA: NEGATIVE
NITRITE UA: NEGATIVE
PH UA: 6 (ref 5.0–8.0)
PROTEIN UA: NEGATIVE
Spec Grav, UA: 1.015 (ref 1.010–1.025)
Urobilinogen, UA: 0.2 E.U./dL

## 2017-08-27 LAB — POCT UA - MICROSCOPIC ONLY
BACTERIA, U MICROSCOPIC: NONE SEEN
RBC, URINE, MICROSCOPIC: NONE SEEN

## 2017-08-28 ENCOUNTER — Encounter: Payer: Self-pay | Admitting: Family Medicine

## 2017-08-29 ENCOUNTER — Other Ambulatory Visit: Payer: Self-pay | Admitting: Family Medicine

## 2017-08-29 DIAGNOSIS — M0579 Rheumatoid arthritis with rheumatoid factor of multiple sites without organ or systems involvement: Secondary | ICD-10-CM

## 2017-08-29 LAB — URINE CULTURE
MICRO NUMBER: 81421850
SPECIMEN QUALITY:: ADEQUATE

## 2017-08-29 MED ORDER — HYDROCODONE-ACETAMINOPHEN 7.5-325 MG PO TABS: 1.0000 | ORAL_TABLET | Freq: Four times a day (QID) | ORAL | 0 refills | Status: DC | PRN

## 2017-08-30 ENCOUNTER — Telehealth: Payer: Self-pay

## 2017-08-30 MED ORDER — CEFUROXIME AXETIL 250 MG PO TABS: 250.0000 mg | ORAL_TABLET | Freq: Two times a day (BID) | ORAL | 0 refills | Status: DC

## 2017-08-30 NOTE — Telephone Encounter (Signed)
-----   Message from Margo Common, [place] sent at 08/30/2017  8:34 AM EST ----- Culture isolated E.coli that is resistant to the Macrobid and Cipro. Recommend Ceftin 250 mg BID #20 and recheck specimen for cure in 10 days.

## 2017-08-30 NOTE — Telephone Encounter (Signed)
Patient advised. [facility]  

## 2017-09-12 ENCOUNTER — Encounter: Payer: Self-pay | Admitting: Family Medicine

## 2017-09-13 ENCOUNTER — Other Ambulatory Visit: Payer: Self-pay | Admitting: Family Medicine

## 2017-09-13 DIAGNOSIS — N3040 Irradiation cystitis without hematuria: Secondary | ICD-10-CM

## 2017-09-13 MED ORDER — NITROFURANTOIN MONOHYD MACRO 100 MG PO CAPS: ORAL_CAPSULE | ORAL | 3 refills | Status: DC

## 2017-09-20 ENCOUNTER — Encounter: Payer: Self-pay | Admitting: Family Medicine

## 2017-09-26 ENCOUNTER — Encounter: Payer: Self-pay | Admitting: Family Medicine

## 2017-09-27 ENCOUNTER — Other Ambulatory Visit: Payer: Self-pay | Admitting: Family Medicine

## 2017-09-27 ENCOUNTER — Encounter: Payer: Self-pay | Admitting: Family Medicine

## 2017-09-27 DIAGNOSIS — M0579 Rheumatoid arthritis with rheumatoid factor of multiple sites without organ or systems involvement: Secondary | ICD-10-CM

## 2017-09-27 MED ORDER — HYDROCODONE-ACETAMINOPHEN 7.5-325 MG PO TABS: 1.0000 | ORAL_TABLET | Freq: Four times a day (QID) | ORAL | 0 refills | Status: DC | PRN

## 2017-10-01 ENCOUNTER — Encounter: Payer: Self-pay | Admitting: Family Medicine

## 2017-10-01 ENCOUNTER — Telehealth (INDEPENDENT_AMBULATORY_CARE_PROVIDER_SITE_OTHER): Payer: Medicare HMO

## 2017-10-01 ENCOUNTER — Telehealth: Payer: Self-pay

## 2017-10-01 DIAGNOSIS — N3090 Cystitis, unspecified without hematuria: Secondary | ICD-10-CM | POA: Diagnosis not present

## 2017-10-01 LAB — POCT URINALYSIS DIPSTICK
BILIRUBIN UA: NEGATIVE
GLUCOSE UA: NEGATIVE
Ketones, UA: NEGATIVE
Nitrite, UA: POSITIVE
Protein, UA: NEGATIVE
Spec Grav, UA: 1.01 (ref 1.010–1.025)
Urobilinogen, UA: 0.2 E.U./dL
pH, UA: 5 (ref 5.0–8.0)

## 2017-10-01 NOTE — Telephone Encounter (Addendum)
Patients spouse came in office this afternoon to leave urine specimen for patient. Please review results in chart and advise if you would like to send urine off for culture. KW  Proceed with urine C&S. Has positive nitrites on UA. History of chronic radiation cystitis with recurrent UTI's despite Macrobid prophylaxis.

## 2017-10-01 NOTE — Telephone Encounter (Signed)
-----   Message from Margo Common, [place] sent at 10/01/2017  2:47 PM EST ----- Positive nitrites. Please send for C&S with her history of chronic cystitis.

## 2017-10-01 NOTE — Telephone Encounter (Signed)
Culture has been sent in. Jeanne [name]

## 2017-10-03 ENCOUNTER — Encounter: Payer: Self-pay | Admitting: Family Medicine

## 2017-10-04 NOTE — Telephone Encounter (Signed)
Sent e-mail message to report preliminary results of bacterial rods and expect final identification of bacteria with sensitivity report soon.

## 2017-10-05 LAB — URINE CULTURE

## 2017-10-07 MED ORDER — AMOXICILLIN-POT CLAVULANATE 875-125 MG PO TABS: 1.0000 | ORAL_TABLET | Freq: Two times a day (BID) | ORAL | 0 refills | Status: DC

## 2017-10-07 NOTE — Addendum Note (Signed)
Addended by: Jules Schick on: 10/07/2017 01:16 PM   Modules accepted: Orders

## 2017-10-07 NOTE — Telephone Encounter (Signed)
Results sent to patient via my chart email. [facility]

## 2017-10-16 ENCOUNTER — Encounter: Payer: Self-pay | Admitting: Family Medicine

## 2017-10-17 ENCOUNTER — Other Ambulatory Visit: Payer: Self-pay | Admitting: Family Medicine

## 2017-10-17 DIAGNOSIS — M0579 Rheumatoid arthritis with rheumatoid factor of multiple sites without organ or systems involvement: Secondary | ICD-10-CM

## 2017-10-17 MED ORDER — HYDROCODONE-ACETAMINOPHEN 7.5-325 MG PO TABS: 1.0000 | ORAL_TABLET | Freq: Four times a day (QID) | ORAL | 0 refills | Status: DC | PRN

## 2017-10-17 NOTE — Telephone Encounter (Signed)
Printed for pickup.

## 2017-11-04 ENCOUNTER — Other Ambulatory Visit: Payer: Self-pay | Admitting: Family Medicine

## 2017-11-04 DIAGNOSIS — R000 Tachycardia, unspecified: Secondary | ICD-10-CM

## 2017-11-07 ENCOUNTER — Encounter: Payer: Self-pay | Admitting: Family Medicine

## 2017-11-08 ENCOUNTER — Other Ambulatory Visit: Payer: Self-pay | Admitting: Family Medicine

## 2017-11-08 ENCOUNTER — Encounter: Payer: Self-pay | Admitting: Family Medicine

## 2017-11-08 DIAGNOSIS — M0579 Rheumatoid arthritis with rheumatoid factor of multiple sites without organ or systems involvement: Secondary | ICD-10-CM

## 2017-11-08 MED ORDER — HYDROCODONE-ACETAMINOPHEN 7.5-325 MG PO TABS: 1.0000 | ORAL_TABLET | Freq: Four times a day (QID) | ORAL | 0 refills | Status: DC | PRN

## 2017-11-13 ENCOUNTER — Encounter: Payer: Self-pay | Admitting: Family Medicine

## 2017-11-15 ENCOUNTER — Other Ambulatory Visit: Payer: Self-pay | Admitting: Family Medicine

## 2017-11-15 ENCOUNTER — Other Ambulatory Visit (INDEPENDENT_AMBULATORY_CARE_PROVIDER_SITE_OTHER): Payer: Medicare HMO

## 2017-11-15 DIAGNOSIS — N3040 Irradiation cystitis without hematuria: Secondary | ICD-10-CM | POA: Diagnosis not present

## 2017-11-15 LAB — POCT URINALYSIS DIPSTICK
Bilirubin, UA: NEGATIVE
Glucose, UA: NEGATIVE
Ketones, UA: NEGATIVE
NITRITE UA: POSITIVE
SPEC GRAV UA: 1.025 (ref 1.010–1.025)
Urobilinogen, UA: 0.2 E.U./dL
pH, UA: 6 (ref 5.0–8.0)

## 2017-11-15 MED ORDER — AMOXICILLIN-POT CLAVULANATE 875-125 MG PO TABS: 1.0000 | ORAL_TABLET | Freq: Two times a day (BID) | ORAL | 0 refills | Status: DC

## 2017-11-15 NOTE — Progress Notes (Signed)
Agree she needs a C&S of urine with positive nitrites indicating infection. Recommend Augmentin 875 mg BID #20 and use AZO-Standard for any discomfort or frequency.

## 2017-11-15 NOTE — Progress Notes (Signed)
Patient advised. [facility]. Urine culture ordered.

## 2017-11-19 LAB — URINE CULTURE

## 2017-12-02 ENCOUNTER — Encounter: Payer: Self-pay | Admitting: Family Medicine

## 2017-12-03 ENCOUNTER — Other Ambulatory Visit (INDEPENDENT_AMBULATORY_CARE_PROVIDER_SITE_OTHER): Payer: Medicare HMO

## 2017-12-03 ENCOUNTER — Other Ambulatory Visit: Payer: Self-pay | Admitting: Family Medicine

## 2017-12-03 DIAGNOSIS — N3040 Irradiation cystitis without hematuria: Secondary | ICD-10-CM | POA: Diagnosis not present

## 2017-12-03 DIAGNOSIS — N3090 Cystitis, unspecified without hematuria: Secondary | ICD-10-CM

## 2017-12-03 DIAGNOSIS — M0579 Rheumatoid arthritis with rheumatoid factor of multiple sites without organ or systems involvement: Secondary | ICD-10-CM

## 2017-12-03 LAB — POCT URINALYSIS DIPSTICK
Bilirubin, UA: NEGATIVE
Blood, UA: NEGATIVE
Glucose, UA: NEGATIVE
KETONES UA: NEGATIVE
Leukocytes, UA: NEGATIVE
Nitrite, UA: NEGATIVE
PH UA: 6 (ref 5.0–8.0)
PROTEIN UA: NEGATIVE
Spec Grav, UA: >=1.03 — AB (ref 1.010–1.025)
UROBILINOGEN UA: 0.2 U/dL

## 2017-12-03 MED ORDER — HYDROCODONE-ACETAMINOPHEN 7.5-325 MG PO TABS: 1.0000 | ORAL_TABLET | Freq: Four times a day (QID) | ORAL | 0 refills | Status: DC | PRN

## 2017-12-05 ENCOUNTER — Encounter: Payer: Self-pay | Admitting: Family Medicine

## 2017-12-05 ENCOUNTER — Telehealth: Payer: Self-pay

## 2017-12-05 LAB — URINE CULTURE

## 2017-12-05 MED ORDER — CIPROFLOXACIN HCL 500 MG PO TABS: 500.0000 mg | ORAL_TABLET | Freq: Two times a day (BID) | ORAL | 0 refills | Status: DC

## 2017-12-05 NOTE — Telephone Encounter (Signed)
-----   Message from [place], [place] sent at 12/05/2017 11:51 AM EDT ----- Culture isolated pseudomonas bacteria and it is not susceptible to the Augmentin (amoxicillin/clavulanate). Need to switch to Ciprofloxacin 500 mg BID #20 and recheck urinalysis/culture in 10 days.

## 2017-12-05 NOTE — Telephone Encounter (Signed)
Patient advised of urine culture via patient message in my chart. [facility].

## 2017-12-12 ENCOUNTER — Encounter: Payer: Self-pay | Admitting: Family Medicine

## 2017-12-13 ENCOUNTER — Encounter: Payer: Self-pay | Admitting: Family Medicine

## 2017-12-16 ENCOUNTER — Encounter: Payer: Self-pay | Admitting: Family Medicine

## 2017-12-19 ENCOUNTER — Encounter: Payer: Self-pay | Admitting: Family Medicine

## 2017-12-20 ENCOUNTER — Encounter: Payer: Self-pay | Admitting: Family Medicine

## 2017-12-20 ENCOUNTER — Other Ambulatory Visit: Payer: Self-pay | Admitting: Family Medicine

## 2017-12-20 DIAGNOSIS — M0579 Rheumatoid arthritis with rheumatoid factor of multiple sites without organ or systems involvement: Secondary | ICD-10-CM

## 2017-12-20 MED ORDER — HYDROCODONE-ACETAMINOPHEN 7.5-325 MG PO TABS: 1.0000 | ORAL_TABLET | Freq: Four times a day (QID) | ORAL | 0 refills | Status: DC | PRN

## 2017-12-24 ENCOUNTER — Encounter: Payer: Self-pay | Admitting: Family Medicine

## 2018-01-09 ENCOUNTER — Encounter: Payer: Self-pay | Admitting: Family Medicine

## 2018-01-09 ENCOUNTER — Ambulatory Visit (INDEPENDENT_AMBULATORY_CARE_PROVIDER_SITE_OTHER): Payer: Managed Care, Other (non HMO) | Admitting: Family Medicine

## 2018-01-09 VITALS — BP 112/80 | HR 85 | Temp 98.1°F | Wt 155.6 lb

## 2018-01-09 DIAGNOSIS — E782 Mixed hyperlipidemia: Secondary | ICD-10-CM

## 2018-01-09 DIAGNOSIS — I10 Essential (primary) hypertension: Secondary | ICD-10-CM

## 2018-01-09 DIAGNOSIS — M0579 Rheumatoid arthritis with rheumatoid factor of multiple sites without organ or systems involvement: Secondary | ICD-10-CM | POA: Diagnosis not present

## 2018-01-09 DIAGNOSIS — N390 Urinary tract infection, site not specified: Secondary | ICD-10-CM | POA: Diagnosis not present

## 2018-01-09 LAB — POCT URINALYSIS DIPSTICK
BILIRUBIN UA: NEGATIVE
Glucose, UA: NEGATIVE
KETONES UA: NEGATIVE
Leukocytes, UA: NEGATIVE
Nitrite, UA: NEGATIVE
PH UA: 6 (ref 5.0–8.0)
Protein, UA: NEGATIVE
Spec Grav, UA: 1.025 (ref 1.010–1.025)
UROBILINOGEN UA: 0.2 U/dL

## 2018-01-09 MED ORDER — DICLOFENAC SODIUM 75 MG PO TBEC: 75.0000 mg | DELAYED_RELEASE_TABLET | Freq: Two times a day (BID) | ORAL | 3 refills | Status: DC

## 2018-01-09 NOTE — Progress Notes (Signed)
Patient: Jeanne [name] Female    DOB: June 19, 1951   67 y.o.   MRN: [id number] Visit Date: 01/09/2018  Today's Provider: [name], PA   No chief complaint on file.  Subjective:    HPI  Elevated Triglycerides:  Patient presents for a 6 month follow up. Last OV was on 04/23/17 and last labs done on 05/06/17 showed elevated Triglycerides. Patient advised to take Krill oil and lower fats in diet to lower triglycerides. Patient reports good compliance with treatment plan.    Elevated Blood Pressure:  Patient presents for a 6 month follow up. Last OV was on 04/23/17. Patient started Metoprolol. She reports good compliance with treatment plan.   Past Medical History:  Diagnosis Date  . Arthritis   . Hypertension    Past Surgical History:  Procedure Laterality Date  . ABDOMINAL HYSTERECTOMY  09/10/2003  . TOTAL HIP ARTHROPLASTY Right 1981  . TOTAL HIP ARTHROPLASTY Left 1990  . TOTAL KNEE ARTHROPLASTY Right    Family History  Problem Relation Age of Onset  . Breast cancer Mother   . Hypertension Father   . Cancer Father    Allergies  Allergen Reactions  . Codeine Nausea Only and Nausea And Vomiting  . Sulfa Antibiotics Rash    Mouth ulcers    Current Outpatient Medications:  .  cefUROXime (CEFTIN) 250 MG tablet, Take 1 tablet (250 mg total) by mouth 2 (two) times daily with a meal., Disp: 20 tablet, Rfl: 0 .  ciprofloxacin (CIPRO) 500 MG tablet, Take 1 tablet (500 mg total) by mouth 2 (two) times daily., Disp: 20 tablet, Rfl: 0 .  HYDROcodone-acetaminophen (NORCO) 7.5-325 MG tablet, Take 1 tablet by mouth every 6 (six) hours as needed for moderate pain., Disp: 30 tablet, Rfl: 0 .  metoprolol tartrate (LOPRESSOR) 25 MG tablet, TAKE 1 TABLET (25 MG TOTAL) BY MOUTH 2 (TWO) TIMES DAILY., Disp: 180 tablet, Rfl: 3 .  nitrofurantoin, macrocrystal-monohydrate, (MACROBID) 100 MG capsule, TAKE ONE TABLET BY MOUTH DAILY FOR PREVENTION - INCREASE TO TWICE A DAY IF INFECTION., Disp:  30 capsule, Rfl: 3 .  oxaprozin (DAYPRO) 600 MG tablet, TAKE 1 TABLET(600 MG) BY MOUTH DAILY, Disp: 30 tablet, Rfl: 5 .  potassium citrate (UROCIT-K) 10 MEQ (1080 MG) SR tablet, Take 2 tablets (20 mEq total) by mouth daily. (Patient not taking: Reported on 04/23/2017), Disp: 180 tablet, Rfl: 3  Review of Systems  Constitutional: Negative.   Respiratory: Negative.   Cardiovascular: Negative.    Social History   Tobacco Use  . Smoking status: Former Smoker    Years: 20.00    Last attempt to quit: 09/09/1989    Years since quitting: 28.3  . Smokeless tobacco: Never Used  . Tobacco comment: QUIT IN 1990  Substance Use Topics  . Alcohol use: No    Alcohol/week: 0.0 oz   Objective:   BP 112/80 (BP Location: Right Arm, Patient Position: Sitting, Cuff Size: Normal)   Pulse 85   Temp 98.1 F (36.7 C) (Oral)   Wt 155 lb 9.6 oz (70.6 kg)   SpO2 98%   BMI 27.56 kg/m   Physical Exam  Constitutional: She is oriented to person, place, and time. She appears well-developed and well-nourished. No distress.  HENT:  Head: Normocephalic and atraumatic.  Right Ear: Hearing normal.  Left Ear: Hearing normal.  Nose: Nose normal.  Eyes: Conjunctivae and lids are normal. Right eye exhibits no discharge. Left eye exhibits no discharge. No scleral icterus.  Neck: No thyromegaly present.  Cardiovascular: Normal rate and regular rhythm.  Pulmonary/Chest: Effort normal and breath sounds normal. No respiratory distress.  Musculoskeletal:  Gait and Station- Slow stiff tottering gait. Spine, Ribs and Pelvis- Some discomfort in lumbar spine. Mild tenderness without deformities. Very stiff and limited ROM. Right Upper Extremity- Ulnar deviation of fingers at MCP. Very limited ability to grip. Unable to bend finger joints. Stiffness of shoulders and elbows. Left Upper Extremity- Ulnar deviation of MCP joints with straight stiff fingers. Unable to flex into a fist and tender to palpate. Stiffness of  shoulders and elbows. Wrists stiff and slightly enlarged. Right Lower Extremity- Hip very stiff with history of joint replacement and knee replacement. Tender to palpate. Left Lower Extremity- Decrease in hip ROM. Pain with crepitus of the left knee. Decrease ROM (flex only to 80 degrees and extend to 170 degrees). Prominent joint with large scar from past surgery to clean up arthritis and spurs in the 1980's. Upper Extremity- Nodules on elbows. Ulnar deviation of fingers - both hands - with large MCP joints. Extremely diminished ability to flex or extend fingers.Stiffness and tenderness of both ankles. Normal symmetric pulses.  Lymphadenopathy:    She has no cervical adenopathy.  Neurological: She is alert and oriented to person, place, and time.  Skin: Skin is intact. No lesion and no rash noted.  Psychiatric: She has a normal mood and affect. Her speech is normal and behavior is normal. Thought content normal.      Assessment & Plan:     1. Essential hypertension Well controlled BP on the Metoprolol Tartrate 25 mg BID. No chest pains, dyspnea or palpitations. Check routine labs and limit salt intake. Recheck pending reports. - CBC with Differential/Platelet - Comprehensive metabolic panel - TSH  2. Mixed hyperlipidemia Trying to follow a low fat diet. Arthritis worsening and does not allow her to exercise. Continues to use [employer] daily. Recheck CMP, Lipid Panel and TSH. Need to lose a little weight. Recheck pending reports. - Comprehensive metabolic panel - Lipid panel - TSH  3. Rheumatoid arthritis involving multiple sites with positive rheumatoid factor (HCC) Joint pains and stiffness worsening (especially in ankles) despite use of Norco prn with Daypro qd. Requests change of NSAID to Diclofenac. Will check labs and follow up pending reports. - Comprehensive metabolic panel - diclofenac (VOLTAREN) 75 MG EC tablet; Take 1 tablet (75 mg total) by mouth 2 (two) times daily.   Dispense: 60 tablet; Refill: 3  4. Recurrent UTI Feeling well without dysuria. Urinalysis showed some RBC's but no leukocytes or nitrites on dipstick. Will get culture-for-cure and may use D-mannose (OTC) to prevent recurrences.  - POCT Urinalysis Dipstick - CBC with Differential/Platelet - Urine Culture       [name], PA  [facility]

## 2018-01-11 LAB — URINE CULTURE: ORGANISM ID, BACTERIA: NO GROWTH

## 2018-01-14 ENCOUNTER — Other Ambulatory Visit: Payer: Self-pay | Admitting: Family Medicine

## 2018-01-14 ENCOUNTER — Encounter: Payer: Self-pay | Admitting: Family Medicine

## 2018-01-14 DIAGNOSIS — M0579 Rheumatoid arthritis with rheumatoid factor of multiple sites without organ or systems involvement: Secondary | ICD-10-CM

## 2018-01-14 MED ORDER — HYDROCODONE-ACETAMINOPHEN 7.5-325 MG PO TABS: 1.0000 | ORAL_TABLET | Freq: Four times a day (QID) | ORAL | 0 refills | Status: DC | PRN

## 2018-01-16 DIAGNOSIS — E782 Mixed hyperlipidemia: Secondary | ICD-10-CM | POA: Diagnosis not present

## 2018-01-16 DIAGNOSIS — M0579 Rheumatoid arthritis with rheumatoid factor of multiple sites without organ or systems involvement: Secondary | ICD-10-CM | POA: Diagnosis not present

## 2018-01-16 DIAGNOSIS — I10 Essential (primary) hypertension: Secondary | ICD-10-CM | POA: Diagnosis not present

## 2018-01-16 DIAGNOSIS — N390 Urinary tract infection, site not specified: Secondary | ICD-10-CM | POA: Diagnosis not present

## 2018-01-17 LAB — COMPREHENSIVE METABOLIC PANEL
A/G RATIO: 1.5 (ref 1.2–2.2)
ALBUMIN: 4 g/dL (ref 3.6–4.8)
ALK PHOS: 225 IU/L — AB (ref 39–117)
ALT: 38 IU/L — ABNORMAL HIGH (ref 0–32)
AST: 35 IU/L (ref 0–40)
BUN / CREAT RATIO: 23 (ref 12–28)
BUN: 19 mg/dL (ref 8–27)
Bilirubin Total: 0.6 mg/dL (ref 0.0–1.2)
CO2: 22 mmol/L (ref 20–29)
Calcium: 9.2 mg/dL (ref 8.7–10.3)
Chloride: 106 mmol/L (ref 96–106)
Creatinine, Ser: 0.81 mg/dL (ref 0.57–1.00)
GFR calc Af Amer: 88 mL/min/{1.73_m2} (ref >59)
GFR calc non Af Amer: 76 mL/min/{1.73_m2} (ref >59)
Globulin, Total: 2.7 g/dL (ref 1.5–4.5)
Glucose: 107 mg/dL — ABNORMAL HIGH (ref 65–99)
Potassium: 4.9 mmol/L (ref 3.5–5.2)
SODIUM: 142 mmol/L (ref 134–144)
Total Protein: 6.7 g/dL (ref 6.0–8.5)

## 2018-01-17 LAB — CBC WITH DIFFERENTIAL/PLATELET
BASOS ABS: 0 10*3/uL (ref 0.0–0.2)
BASOS: 1 %
EOS (ABSOLUTE): 0.3 10*3/uL (ref 0.0–0.4)
EOS: 4 %
HEMATOCRIT: 44.8 % (ref 34.0–46.6)
HEMOGLOBIN: 15 g/dL (ref 11.1–15.9)
Immature Grans (Abs): 0 10*3/uL (ref 0.0–0.1)
Immature Granulocytes: 0 %
LYMPHS ABS: 1.4 10*3/uL (ref 0.7–3.1)
Lymphs: 17 %
MCH: 29.3 pg (ref 26.6–33.0)
MCHC: 33.5 g/dL (ref 31.5–35.7)
MCV: 88 fL (ref 79–97)
MONOS ABS: 0.4 10*3/uL (ref 0.1–0.9)
Monocytes: 5 %
NEUTROS ABS: 5.8 10*3/uL (ref 1.4–7.0)
NEUTROS PCT: 73 %
Platelets: 306 10*3/uL (ref 150–379)
RBC: 5.12 x10E6/uL (ref 3.77–5.28)
RDW: 13.9 % (ref 12.3–15.4)
WBC: 7.9 10*3/uL (ref 3.4–10.8)

## 2018-01-17 LAB — LIPID PANEL
CHOLESTEROL TOTAL: 189 mg/dL (ref 100–199)
Chol/HDL Ratio: 2.8 ratio (ref 0.0–4.4)
HDL: 67 mg/dL (ref >39)
LDL Calculated: 95 mg/dL (ref 0–99)
Triglycerides: 136 mg/dL (ref 0–149)
VLDL Cholesterol Cal: 27 mg/dL (ref 5–40)

## 2018-01-17 LAB — TSH: TSH: 1.99 u[IU]/mL (ref 0.450–4.500)

## 2018-02-10 ENCOUNTER — Encounter: Payer: Self-pay | Admitting: Family Medicine

## 2018-02-11 ENCOUNTER — Other Ambulatory Visit: Payer: Self-pay | Admitting: Family Medicine

## 2018-02-11 DIAGNOSIS — M0579 Rheumatoid arthritis with rheumatoid factor of multiple sites without organ or systems involvement: Secondary | ICD-10-CM

## 2018-02-11 DIAGNOSIS — N3040 Irradiation cystitis without hematuria: Secondary | ICD-10-CM

## 2018-02-11 MED ORDER — NITROFURANTOIN MONOHYD MACRO 100 MG PO CAPS: ORAL_CAPSULE | ORAL | 3 refills | Status: DC

## 2018-02-11 MED ORDER — HYDROCODONE-ACETAMINOPHEN 7.5-325 MG PO TABS: 1.0000 | ORAL_TABLET | Freq: Four times a day (QID) | ORAL | 0 refills | Status: DC | PRN

## 2018-02-17 ENCOUNTER — Encounter: Payer: Self-pay | Admitting: Family Medicine

## 2018-02-18 ENCOUNTER — Other Ambulatory Visit (INDEPENDENT_AMBULATORY_CARE_PROVIDER_SITE_OTHER): Payer: Managed Care, Other (non HMO)

## 2018-02-18 DIAGNOSIS — N390 Urinary tract infection, site not specified: Secondary | ICD-10-CM

## 2018-02-18 DIAGNOSIS — N3040 Irradiation cystitis without hematuria: Secondary | ICD-10-CM

## 2018-02-18 DIAGNOSIS — R300 Dysuria: Secondary | ICD-10-CM

## 2018-02-18 LAB — POCT URINALYSIS DIPSTICK
Bilirubin, UA: NEGATIVE
GLUCOSE UA: NEGATIVE
KETONES UA: NEGATIVE
Nitrite, UA: POSITIVE
PH UA: 6.5 (ref 5.0–8.0)
Protein, UA: POSITIVE — AB
Spec Grav, UA: 1.025 (ref 1.010–1.025)
UROBILINOGEN UA: 0.2 U/dL

## 2018-02-18 NOTE — Progress Notes (Signed)
Patient advised.

## 2018-02-18 NOTE — Progress Notes (Signed)
Urinalysis and Urine culture collected.

## 2018-02-18 NOTE — Progress Notes (Signed)
Recommend she increase the Macrodantin to BID since urinalysis indicates infection. Awaiting culture report to see if sensitivity indicates a different antibiotic may be needed.

## 2018-02-20 ENCOUNTER — Encounter: Payer: Self-pay | Admitting: Family Medicine

## 2018-02-20 ENCOUNTER — Telehealth: Payer: Self-pay

## 2018-02-20 LAB — URINE CULTURE

## 2018-02-20 MED ORDER — DOXYCYCLINE HYCLATE 100 MG PO TABS: 100.0000 mg | ORAL_TABLET | Freq: Two times a day (BID) | ORAL | 0 refills | Status: DC

## 2018-02-20 NOTE — Telephone Encounter (Signed)
Patient advised through patient email. She emailed to check the status of results. [facility].

## 2018-02-20 NOTE — Telephone Encounter (Signed)
-----   Message from Margo Common, [place] sent at 02/20/2018 12:59 PM EDT ----- Culture isolated E.coli as the cause of infection this time and it is resistant to the Nitrofuantoin and the Cipro. Switch to a tetracycline-type antibiotic - Doxycycline 100 mg BID #20 and recheck urine culture for cure in 10 days.

## 2018-03-03 ENCOUNTER — Encounter: Payer: Self-pay | Admitting: Family Medicine

## 2018-03-04 ENCOUNTER — Encounter: Payer: Self-pay | Admitting: Family Medicine

## 2018-03-04 ENCOUNTER — Other Ambulatory Visit: Payer: Self-pay | Admitting: Family Medicine

## 2018-03-04 DIAGNOSIS — M0579 Rheumatoid arthritis with rheumatoid factor of multiple sites without organ or systems involvement: Secondary | ICD-10-CM

## 2018-03-04 MED ORDER — HYDROCODONE-ACETAMINOPHEN 7.5-325 MG PO TABS: 1.0000 | ORAL_TABLET | Freq: Four times a day (QID) | ORAL | 0 refills | Status: DC | PRN

## 2018-03-10 ENCOUNTER — Telehealth (INDEPENDENT_AMBULATORY_CARE_PROVIDER_SITE_OTHER): Payer: Managed Care, Other (non HMO)

## 2018-03-10 ENCOUNTER — Encounter: Payer: Self-pay | Admitting: Family Medicine

## 2018-03-10 DIAGNOSIS — N3090 Cystitis, unspecified without hematuria: Secondary | ICD-10-CM

## 2018-03-10 LAB — POCT URINALYSIS DIPSTICK
BILIRUBIN UA: NEGATIVE
Blood, UA: NEGATIVE
GLUCOSE UA: NEGATIVE
KETONES UA: NEGATIVE
Nitrite, UA: NEGATIVE
PH UA: 6 (ref 5.0–8.0)
Protein, UA: NEGATIVE
UROBILINOGEN UA: 0.2 U/dL

## 2018-03-10 NOTE — Telephone Encounter (Signed)
Urinalysis shows concentrated urine. Need more water intake (6-8 glasses a day). Will recheck urine C&S for signs of persistent infection.

## 2018-03-10 NOTE — Telephone Encounter (Signed)
Pt's husband brought in a urine sample.  Results are in her chart.  There is an UA and culture tube in the lab.   Thanks,   -Mickel Baas

## 2018-03-10 NOTE — Telephone Encounter (Signed)
Urine culture sent to [facility].

## 2018-03-11 ENCOUNTER — Encounter: Payer: Self-pay | Admitting: Family Medicine

## 2018-03-12 LAB — SPECIMEN STATUS REPORT

## 2018-03-12 LAB — URINE CULTURE

## 2018-03-14 ENCOUNTER — Telehealth: Payer: Self-pay

## 2018-03-14 DIAGNOSIS — N3040 Irradiation cystitis without hematuria: Secondary | ICD-10-CM

## 2018-03-14 MED ORDER — NITROFURANTOIN MONOHYD MACRO 100 MG PO CAPS: 100.0000 mg | ORAL_CAPSULE | Freq: Two times a day (BID) | ORAL | 0 refills | Status: DC

## 2018-03-14 NOTE — Telephone Encounter (Signed)
-----   Message from Margo Common, [place] sent at 03/14/2018  8:40 AM EDT ----- Entirely different bacteria on culture this time (was E.coli - now Enterococcus). This one is resistant to the Doxycycline but sensitive to the Macrobid - should use it BID for 10 days. May need recheck with urologist if this keeps occurring.

## 2018-03-14 NOTE — Telephone Encounter (Signed)
Pt called requesting results. Advised her as below. Sent in abx. Asked her to call back if there is no improvement after 10 day course.

## 2018-03-30 ENCOUNTER — Encounter: Payer: Self-pay | Admitting: Family Medicine

## 2018-03-31 ENCOUNTER — Other Ambulatory Visit: Payer: Self-pay | Admitting: *Deleted

## 2018-03-31 DIAGNOSIS — M0579 Rheumatoid arthritis with rheumatoid factor of multiple sites without organ or systems involvement: Secondary | ICD-10-CM

## 2018-03-31 MED ORDER — HYDROCODONE-ACETAMINOPHEN 7.5-325 MG PO TABS: 1.0000 | ORAL_TABLET | Freq: Four times a day (QID) | ORAL | 0 refills | Status: DC | PRN

## 2018-04-01 ENCOUNTER — Encounter: Payer: Self-pay | Admitting: Family Medicine

## 2018-04-02 ENCOUNTER — Encounter: Payer: Self-pay | Admitting: Physician Assistant

## 2018-04-02 ENCOUNTER — Ambulatory Visit (INDEPENDENT_AMBULATORY_CARE_PROVIDER_SITE_OTHER): Payer: Medicare HMO | Admitting: Physician Assistant

## 2018-04-02 VITALS — BP 160/80 | HR 82 | Temp 97.8°F | Resp 16 | Wt 159.4 lb

## 2018-04-02 DIAGNOSIS — N3040 Irradiation cystitis without hematuria: Secondary | ICD-10-CM

## 2018-04-02 DIAGNOSIS — N390 Urinary tract infection, site not specified: Secondary | ICD-10-CM | POA: Diagnosis not present

## 2018-04-02 LAB — POCT URINALYSIS DIPSTICK
BILIRUBIN UA: NEGATIVE
Glucose, UA: NEGATIVE
Ketones, UA: NEGATIVE
NITRITE UA: NEGATIVE
PROTEIN UA: POSITIVE — AB
Spec Grav, UA: 1.025 (ref 1.010–1.025)
UROBILINOGEN UA: 0.2 U/dL
pH, UA: 6 (ref 5.0–8.0)

## 2018-04-02 MED ORDER — CIPROFLOXACIN HCL 500 MG PO TABS: 500.0000 mg | ORAL_TABLET | Freq: Two times a day (BID) | ORAL | 0 refills | Status: DC

## 2018-04-02 MED ORDER — PHENAZOPYRIDINE HCL 200 MG PO TABS: 200.0000 mg | ORAL_TABLET | Freq: Three times a day (TID) | ORAL | 1 refills | Status: DC | PRN

## 2018-04-02 NOTE — Telephone Encounter (Signed)
I think she is asking if she can bring a urine sample to culture. If she is having any symptoms of a UTI then that would be fine.

## 2018-04-02 NOTE — Progress Notes (Signed)
Patient: Jeanne [name] Female    DOB: [birth year]   67 y.o.   MRN: [id number] Visit Date: 04/02/2018  Today's Provider: Mar Daring, PA-C   Chief Complaint  Patient presents with  . Recurrent UTI   Subjective:    HPI Patient here today with c/o finishing Macrobid and wants her urine to be recheck. Patient reports that she still having burning, some blood. Does have history of recurrent UTI secondary to chronic radiation cystitis. She had been on Macrobid once daily for preventative over the last 2 years. Most recently she had an E.coli UTI and was treated with doxycycline. She then had recurrent infection that was enterococcus and was increased to Macrobid BID from once daily. She reports symptoms never changed even though culture showed sensitivity.       Allergies  Allergen Reactions  . Codeine Nausea Only and Nausea And Vomiting  . Sulfa Antibiotics Rash    Mouth ulcers     Current Outpatient Medications:  .  diclofenac (VOLTAREN) 75 MG EC tablet, Take 1 tablet (75 mg total) by mouth 2 (two) times daily., Disp: 60 tablet, Rfl: 3 .  HYDROcodone-acetaminophen (NORCO) 7.5-325 MG tablet, Take 1 tablet by mouth every 6 (six) hours as needed for moderate pain., Disp: 30 tablet, Rfl: 0 .  metoprolol tartrate (LOPRESSOR) 25 MG tablet, TAKE 1 TABLET (25 MG TOTAL) BY MOUTH 2 (TWO) TIMES DAILY., Disp: 180 tablet, Rfl: 3 .  potassium citrate (UROCIT-K) 10 MEQ (1080 MG) SR tablet, Take 2 tablets (20 mEq total) by mouth daily. (Patient not taking: Reported on 04/23/2017), Disp: 180 tablet, Rfl: 3  Review of Systems  Constitutional: Negative.   Gastrointestinal: Positive for abdominal pain.  Genitourinary: Positive for dysuria, enuresis, frequency, hematuria and pelvic pain. Negative for flank pain.  Musculoskeletal: Positive for back pain.  Neurological: Negative.     Social History   Tobacco Use  . Smoking status: Former Smoker    Years: 20.00    Last attempt to  quit: 09/09/1989    Years since quitting: 28.5  . Smokeless tobacco: Never Used  . Tobacco comment: QUIT IN 1990  Substance Use Topics  . Alcohol use: No    Alcohol/week: 0.0 oz   Objective:   BP (!) 160/80 (BP Location: Left Arm, Patient Position: Sitting, Cuff Size: Normal)   Pulse 82   Temp 97.8 F (36.6 C) (Oral)   Resp 16   Wt 159 lb 6.4 oz (72.3 kg)   BMI 28.24 kg/m  Vitals:   04/02/18 1447  BP: (!) 160/80  Pulse: 82  Resp: 16  Temp: 97.8 F (36.6 C)  TempSrc: Oral  Weight: 159 lb 6.4 oz (72.3 kg)     Physical Exam  Constitutional: She is oriented to person, place, and time. She appears well-developed and well-nourished. No distress.  Cardiovascular: Normal rate, regular rhythm and normal heart sounds. Exam reveals no gallop and no friction rub.  No murmur heard. Pulmonary/Chest: Effort normal and breath sounds normal. No respiratory distress. She has no wheezes. She has no rales.  Abdominal: Soft. Normal appearance and bowel sounds are normal. She exhibits no distension and no mass. There is no hepatosplenomegaly. There is tenderness in the suprapubic area. There is no rebound, no guarding and no CVA tenderness.  Neurological: She is alert and oriented to person, place, and time.  Skin: Skin is warm and dry. She is not diaphoretic.       Assessment & Plan:  1. Recurrent UTI Worsening symptoms. UA positive. Will treat empirically with Cipro as below. Pyridium given for spasm. Continue to push fluids. Urine sent for culture. Will follow up pending C&S results. She is to call if symptoms do not improve or if they worsen.  - POCT urinalysis dipstick - Urine Culture - phenazopyridine (PYRIDIUM) 200 MG tablet; Take 1 tablet (200 mg total) by mouth 3 (three) times daily as needed for pain.  Dispense: 90 tablet; Refill: 1 - ciprofloxacin (CIPRO) 500 MG tablet; Take 1 tablet (500 mg total) by mouth 2 (two) times daily.  Dispense: 20 tablet; Refill: 0  2. Chronic  radiation cystitis See above medical treatment plan. - phenazopyridine (PYRIDIUM) 200 MG tablet; Take 1 tablet (200 mg total) by mouth 3 (three) times daily as needed for pain.  Dispense: 90 tablet; Refill: [place], PA-C  [facility]

## 2018-04-03 ENCOUNTER — Encounter: Payer: Self-pay | Admitting: Physician Assistant

## 2018-04-05 LAB — URINE CULTURE

## 2018-04-07 ENCOUNTER — Telehealth: Payer: Self-pay

## 2018-04-07 NOTE — Telephone Encounter (Signed)
-----   Message from Mar Daring, PA-C sent at 04/07/2018  8:32 AM EDT ----- This time urine grew out pseudomonas. It is sensitive to the Cipro I placed you on. Continue until completed and call if symptoms do not completely resolve or if the return.

## 2018-04-07 NOTE — Telephone Encounter (Signed)
Viewed by [name] on 04/07/2018 8:33 AM

## 2018-04-14 ENCOUNTER — Encounter: Payer: Self-pay | Admitting: Physician Assistant

## 2018-04-14 DIAGNOSIS — N390 Urinary tract infection, site not specified: Secondary | ICD-10-CM

## 2018-04-16 DIAGNOSIS — N390 Urinary tract infection, site not specified: Secondary | ICD-10-CM | POA: Diagnosis not present

## 2018-04-17 ENCOUNTER — Telehealth: Payer: Self-pay

## 2018-04-17 LAB — URINALYSIS, ROUTINE W REFLEX MICROSCOPIC
Bilirubin, UA: NEGATIVE
GLUCOSE, UA: NEGATIVE
KETONES UA: NEGATIVE
Nitrite, UA: POSITIVE — AB
RBC UA: NEGATIVE
SPEC GRAV UA: 1.024 (ref 1.005–1.030)
UUROB: 1 mg/dL (ref 0.2–1.0)
pH, UA: 6.5 (ref 5.0–7.5)

## 2018-04-17 LAB — MICROSCOPIC EXAMINATION
Casts: NONE SEEN /lpf
EPITHELIAL CELLS (NON RENAL): NONE SEEN /HPF (ref 0–10)

## 2018-04-17 NOTE — Telephone Encounter (Signed)
LM

## 2018-04-17 NOTE — Telephone Encounter (Signed)
-----   Message from Mar Daring, [place] sent at 04/17/2018  8:37 AM EDT ----- UA seems to appear to still have bacteria present. I have not yet received the culture back. Once I receive the culture I will send in an antibiotic for you.

## 2018-04-18 ENCOUNTER — Telehealth: Payer: Self-pay

## 2018-04-18 LAB — URINE CULTURE

## 2018-04-18 MED ORDER — AMOXICILLIN-POT CLAVULANATE 875-125 MG PO TABS: 1.0000 | ORAL_TABLET | Freq: Two times a day (BID) | ORAL | 0 refills | Status: DC

## 2018-04-18 NOTE — Addendum Note (Signed)
Addended by: Mar Daring on: 04/18/2018 11:53 AM   Modules accepted: Orders

## 2018-04-18 NOTE — Telephone Encounter (Signed)
-----   Message from Mar Daring, PA-C sent at 04/18/2018 11:50 AM EDT ----- Urine culture was positive for E. Coli. It is resistant to a few antibiotics. I will send in Augmentin as it is sensitive to that. I am going to do a prolonged course to try to treat this for you. However, augmentin can cause upset stomach, diarrhea and/or yeast infection. With [place] doing a longer treatment please call if you develop any side effects.

## 2018-04-18 NOTE — Telephone Encounter (Signed)
Patient advised as directed below.  Thanks,  -Kalese Ensz 

## 2018-04-23 ENCOUNTER — Encounter: Payer: Self-pay | Admitting: Family Medicine

## 2018-04-23 DIAGNOSIS — M0579 Rheumatoid arthritis with rheumatoid factor of multiple sites without organ or systems involvement: Secondary | ICD-10-CM

## 2018-04-24 MED ORDER — HYDROCODONE-ACETAMINOPHEN 7.5-325 MG PO TABS: 1.0000 | ORAL_TABLET | Freq: Four times a day (QID) | ORAL | 0 refills | Status: DC | PRN

## 2018-05-20 ENCOUNTER — Encounter: Payer: Self-pay | Admitting: Family Medicine

## 2018-05-20 ENCOUNTER — Other Ambulatory Visit: Payer: Self-pay | Admitting: Family Medicine

## 2018-05-20 DIAGNOSIS — M0579 Rheumatoid arthritis with rheumatoid factor of multiple sites without organ or systems involvement: Secondary | ICD-10-CM

## 2018-05-20 MED ORDER — HYDROCODONE-ACETAMINOPHEN 7.5-325 MG PO TABS: 1.0000 | ORAL_TABLET | Freq: Four times a day (QID) | ORAL | 0 refills | Status: DC | PRN

## 2018-05-26 ENCOUNTER — Encounter: Payer: Self-pay | Admitting: Family Medicine

## 2018-05-28 ENCOUNTER — Telehealth: Payer: Self-pay | Admitting: Family Medicine

## 2018-05-28 NOTE — Telephone Encounter (Signed)
I called the patient to schedule her AWV-I w/ [facility].  She said that she will call back later to schedule it when she has her calendar. VDM (DD)

## 2018-05-28 NOTE — Telephone Encounter (Signed)
Pt returned call and is scheduled for AWV with [facility] @ 10 am & CPE with Simona Huh @ 10:40 am on 06/26/18. Thanks TNP

## 2018-05-29 ENCOUNTER — Telehealth (INDEPENDENT_AMBULATORY_CARE_PROVIDER_SITE_OTHER): Payer: Medicare HMO

## 2018-05-29 ENCOUNTER — Telehealth: Payer: Self-pay

## 2018-05-29 DIAGNOSIS — N3090 Cystitis, unspecified without hematuria: Secondary | ICD-10-CM

## 2018-05-29 DIAGNOSIS — N390 Urinary tract infection, site not specified: Secondary | ICD-10-CM

## 2018-05-29 LAB — POCT URINALYSIS DIPSTICK
Bilirubin, UA: NEGATIVE
Glucose, UA: NEGATIVE
Ketones, UA: NEGATIVE
Nitrite, UA: POSITIVE
PH UA: 6.5 (ref 5.0–8.0)
PROTEIN UA: POSITIVE — AB
Spec Grav, UA: 1.025 (ref 1.010–1.025)
UROBILINOGEN UA: 0.2 U/dL

## 2018-05-29 MED ORDER — AMOXICILLIN-POT CLAVULANATE 875-125 MG PO TABS: 1.0000 | ORAL_TABLET | Freq: Two times a day (BID) | ORAL | 0 refills | Status: DC

## 2018-05-29 NOTE — Telephone Encounter (Signed)
Pt drop off an urine sample.    Thanks,   -Mickel Baas

## 2018-05-29 NOTE — Telephone Encounter (Signed)
-----   Message from Margo Common, [place] sent at 05/29/2018  9:09 AM EDT ----- Urinalysis indicates recurrence of a UTI with history of radiation cystitis. Will recheck urine culture and sensitivity. Should restart the Augmentin 875 mg BID #20 and use AZO-Standard (OTC) prn discomfort. Infections coming too frequently and daily antibiotics (Macrodantin) not preventing recurrences. Should schedule urology evaluation.

## 2018-05-29 NOTE — Telephone Encounter (Signed)
Pt advised.  She agreed to the referral to Urology.   Thanks,   -Mickel Baas

## 2018-05-29 NOTE — Telephone Encounter (Signed)
[facility] 05/29/2018  RX sent to [facility].   Thanks,   -Mickel Baas

## 2018-05-29 NOTE — Telephone Encounter (Signed)
Pt returned missed call.  Please call pt back. ° °Thanks, °TGH °

## 2018-05-30 ENCOUNTER — Other Ambulatory Visit: Payer: Self-pay | Admitting: Family Medicine

## 2018-05-30 DIAGNOSIS — N390 Urinary tract infection, site not specified: Secondary | ICD-10-CM

## 2018-05-30 DIAGNOSIS — N3040 Irradiation cystitis without hematuria: Secondary | ICD-10-CM

## 2018-05-30 NOTE — Telephone Encounter (Signed)
Order placed for urology referral. Awaiting final report on culture.

## 2018-06-02 ENCOUNTER — Encounter: Payer: Self-pay | Admitting: Family Medicine

## 2018-06-02 ENCOUNTER — Telehealth: Payer: Self-pay

## 2018-06-02 LAB — CULTURE, URINE COMPREHENSIVE

## 2018-06-02 NOTE — Telephone Encounter (Signed)
-----   Message from Margo Common, [place] sent at 06/01/2018  5:05 PM EDT ----- Same bacteria with same sensitivity. Proceed with Augmentin BID and referral to urologist,

## 2018-06-02 NOTE — Telephone Encounter (Signed)
Patient advised as below. Patient verbalizes understanding and is in agreement with treatment plan.  

## 2018-06-02 NOTE — Telephone Encounter (Signed)
Ask Parke Poisson if this appointment can be changed to [facility] (maybe Dr. Erlene Quan or Jimmye Norman) instead of Dr. Matilde Sprang

## 2018-06-10 ENCOUNTER — Encounter: Payer: Self-pay | Admitting: Family Medicine

## 2018-06-13 ENCOUNTER — Other Ambulatory Visit: Payer: Self-pay | Admitting: Family Medicine

## 2018-06-13 ENCOUNTER — Encounter: Payer: Self-pay | Admitting: Family Medicine

## 2018-06-13 DIAGNOSIS — M0579 Rheumatoid arthritis with rheumatoid factor of multiple sites without organ or systems involvement: Secondary | ICD-10-CM

## 2018-06-13 MED ORDER — HYDROCODONE-ACETAMINOPHEN 7.5-325 MG PO TABS: 1.0000 | ORAL_TABLET | Freq: Four times a day (QID) | ORAL | 0 refills | Status: DC | PRN

## 2018-06-13 NOTE — Telephone Encounter (Signed)
Pt called asking if her pain medication will be sent to the pharmacy today.  She sent a MY Chart message toady.  Thanks  [employer]

## 2018-06-18 ENCOUNTER — Encounter: Payer: Self-pay | Admitting: Family Medicine

## 2018-06-18 ENCOUNTER — Other Ambulatory Visit: Payer: Self-pay | Admitting: Family Medicine

## 2018-06-18 DIAGNOSIS — M0579 Rheumatoid arthritis with rheumatoid factor of multiple sites without organ or systems involvement: Secondary | ICD-10-CM

## 2018-06-19 ENCOUNTER — Telehealth: Payer: Self-pay

## 2018-06-19 ENCOUNTER — Other Ambulatory Visit (INDEPENDENT_AMBULATORY_CARE_PROVIDER_SITE_OTHER): Payer: Medicare HMO

## 2018-06-19 ENCOUNTER — Encounter: Payer: Self-pay | Admitting: Family Medicine

## 2018-06-19 DIAGNOSIS — R319 Hematuria, unspecified: Secondary | ICD-10-CM

## 2018-06-19 DIAGNOSIS — N390 Urinary tract infection, site not specified: Secondary | ICD-10-CM | POA: Diagnosis not present

## 2018-06-19 LAB — POCT URINALYSIS DIPSTICK
BILIRUBIN UA: NEGATIVE
GLUCOSE UA: NEGATIVE
KETONES UA: NEGATIVE
NITRITE UA: POSITIVE
Protein, UA: POSITIVE — AB
Spec Grav, UA: 1.02 (ref 1.010–1.025)
Urobilinogen, UA: 0.2 E.U./dL
pH, UA: 6.5 (ref 5.0–8.0)

## 2018-06-19 NOTE — Telephone Encounter (Signed)
Patient was advised and has an upcoming appointment with the urology on November 5,2019.

## 2018-06-19 NOTE — Telephone Encounter (Signed)
-----   Message from Margo Common, [place] sent at 06/19/2018  1:37 PM EDT ----- Urinalysis shows more infection again. Will get culture. Have not seen any urology report. Has referral been accomplished, yet?

## 2018-06-19 NOTE — Progress Notes (Signed)
Patient brought in urine sample. Urine was positive for leukocytes and blood. Urine was also drawn to be sent for culture. Results entered in patient's chart and written on Simona Huh' desk for review.

## 2018-06-21 ENCOUNTER — Telehealth: Payer: Self-pay

## 2018-06-21 ENCOUNTER — Encounter: Payer: Self-pay | Admitting: Family Medicine

## 2018-06-21 DIAGNOSIS — N390 Urinary tract infection, site not specified: Secondary | ICD-10-CM

## 2018-06-21 DIAGNOSIS — R319 Hematuria, unspecified: Principal | ICD-10-CM

## 2018-06-21 MED ORDER — CEFUROXIME AXETIL 250 MG PO TABS: 250.0000 mg | ORAL_TABLET | Freq: Two times a day (BID) | ORAL | 0 refills | Status: DC

## 2018-06-21 NOTE — Telephone Encounter (Signed)
Advised patient as below. Medication was sent into the pharmacy.  

## 2018-06-21 NOTE — Telephone Encounter (Signed)
-----   Message from Margo Common, [place] sent at 06/21/2018  9:17 AM EDT ----- Culture isolated E.coli again. Awaiting sensitivity results. Recommend change of antibiotic to Ceftin (cefuroxine) 250 mg BID #20 and may use AZO-Standard if having any discomfort. Must proceed with urology evaluation as planned.

## 2018-06-23 ENCOUNTER — Telehealth: Payer: Self-pay | Admitting: Family Medicine

## 2018-06-23 NOTE — Telephone Encounter (Signed)
Pt returned call for culture results. Please advise. Thanks TNP

## 2018-06-24 LAB — URINE CULTURE

## 2018-06-26 ENCOUNTER — Ambulatory Visit: Payer: Medicare HMO

## 2018-06-26 ENCOUNTER — Encounter: Payer: Medicare HMO | Admitting: Family Medicine

## 2018-06-27 ENCOUNTER — Ambulatory Visit: Payer: Medicare HMO

## 2018-06-27 ENCOUNTER — Encounter: Payer: Medicare HMO | Admitting: Family Medicine

## 2018-07-03 ENCOUNTER — Ambulatory Visit: Payer: Medicare HMO

## 2018-07-03 ENCOUNTER — Encounter: Payer: Medicare HMO | Admitting: Family Medicine

## 2018-07-07 ENCOUNTER — Encounter: Payer: Self-pay | Admitting: Family Medicine

## 2018-07-08 ENCOUNTER — Other Ambulatory Visit: Payer: Self-pay | Admitting: Family Medicine

## 2018-07-08 DIAGNOSIS — M0579 Rheumatoid arthritis with rheumatoid factor of multiple sites without organ or systems involvement: Secondary | ICD-10-CM

## 2018-07-08 MED ORDER — HYDROCODONE-ACETAMINOPHEN 7.5-325 MG PO TABS: 1.0000 | ORAL_TABLET | Freq: Four times a day (QID) | ORAL | 0 refills | Status: DC | PRN

## 2018-07-09 ENCOUNTER — Encounter: Payer: Self-pay | Admitting: Family Medicine

## 2018-07-10 ENCOUNTER — Encounter: Payer: Self-pay | Admitting: Family Medicine

## 2018-07-10 ENCOUNTER — Telehealth: Payer: Self-pay

## 2018-07-10 ENCOUNTER — Other Ambulatory Visit (INDEPENDENT_AMBULATORY_CARE_PROVIDER_SITE_OTHER): Payer: Medicare HMO

## 2018-07-10 DIAGNOSIS — N3040 Irradiation cystitis without hematuria: Secondary | ICD-10-CM | POA: Diagnosis not present

## 2018-07-10 DIAGNOSIS — N3020 Other chronic cystitis without hematuria: Secondary | ICD-10-CM | POA: Diagnosis not present

## 2018-07-10 DIAGNOSIS — N390 Urinary tract infection, site not specified: Secondary | ICD-10-CM

## 2018-07-10 LAB — POCT URINALYSIS DIPSTICK
BILIRUBIN UA: NEGATIVE
Glucose, UA: NEGATIVE
Ketones, UA: NEGATIVE
Nitrite, UA: POSITIVE
PROTEIN UA: NEGATIVE
Spec Grav, UA: 1.02 (ref 1.010–1.025)
UROBILINOGEN UA: 0.2 U/dL
pH, UA: 6 (ref 5.0–8.0)

## 2018-07-10 NOTE — Telephone Encounter (Signed)
Pt's husband dropped off a UA for [place] to test on 07/10/18 per Simona Huh we did UA dip and sent for culture.  dbs

## 2018-07-10 NOTE — Telephone Encounter (Signed)
This patient has severe rheumatoid arthritis with a history of radiation cystitis and frequent UTI's. She may have her husband bring over a urine specimen to check for infection. States she is having her usual symptoms of infection again. Has an appointment to see the urologist on 07-15-18.

## 2018-07-12 LAB — URINE CULTURE

## 2018-07-14 ENCOUNTER — Ambulatory Visit: Payer: Medicare HMO | Admitting: Urology

## 2018-07-14 ENCOUNTER — Other Ambulatory Visit: Payer: Self-pay | Admitting: Family Medicine

## 2018-07-14 DIAGNOSIS — N390 Urinary tract infection, site not specified: Secondary | ICD-10-CM

## 2018-07-14 DIAGNOSIS — R319 Hematuria, unspecified: Principal | ICD-10-CM

## 2018-07-14 MED ORDER — CEFUROXIME AXETIL 250 MG PO TABS: 250.0000 mg | ORAL_TABLET | Freq: Two times a day (BID) | ORAL | 0 refills | Status: DC

## 2018-07-15 ENCOUNTER — Ambulatory Visit: Payer: Medicare HMO | Admitting: Urology

## 2018-07-15 ENCOUNTER — Encounter: Payer: Self-pay | Admitting: Urology

## 2018-07-15 VITALS — BP 147/86 | HR 102 | Ht 63.0 in | Wt 156.4 lb

## 2018-07-15 DIAGNOSIS — N3040 Irradiation cystitis without hematuria: Secondary | ICD-10-CM

## 2018-07-15 DIAGNOSIS — N390 Urinary tract infection, site not specified: Secondary | ICD-10-CM

## 2018-07-15 DIAGNOSIS — Z87442 Personal history of urinary calculi: Secondary | ICD-10-CM | POA: Diagnosis not present

## 2018-07-15 DIAGNOSIS — R310 Gross hematuria: Secondary | ICD-10-CM

## 2018-07-15 DIAGNOSIS — N1330 Unspecified hydronephrosis: Secondary | ICD-10-CM | POA: Diagnosis not present

## 2018-07-15 LAB — MICROSCOPIC EXAMINATION: EPITHELIAL CELLS (NON RENAL): NONE SEEN /HPF (ref 0–10)

## 2018-07-15 LAB — URINALYSIS, COMPLETE
BILIRUBIN UA: NEGATIVE
Glucose, UA: NEGATIVE
KETONES UA: NEGATIVE
NITRITE UA: POSITIVE — AB
PH UA: 5.5 (ref 5.0–7.5)
SPEC GRAV UA: 1.025 (ref 1.005–1.030)
UUROB: 1 mg/dL (ref 0.2–1.0)

## 2018-07-15 LAB — BLADDER SCAN AMB NON-IMAGING

## 2018-07-15 NOTE — Progress Notes (Signed)
07/15/2018 7:54 PM   Jeanne [name] Section 1950-09-19 [id number]  Referring provider: Margo Common, [place], [place] [address]  Chief Complaint  Patient presents with  . Recurrent UTI    New Patient    HPI: 67 yo F with a personal history of radiation cystitis and recurrent UTIs who presents today for further evaluation of this.  Over the past year or so, she has been treated at least 5-7 times for urinary tract infection.  She is growing various organisms including E. Coli (primarily), as well as enterococcus and Pseudomonas.  These have variable resistance pattern.  Most recently, she was seen and evaluated on Thursday with a symptomatic UTI growing E. coli currently on Ceftin as prescribed by her primary care physician.  She has been having urinary tract infections for years, but more recurrent over the recent past.  No febrile UTIs.  No history of pyelonephritis or admissions for infection  She has been having occasional bouts of diarrhea.  She denies any pneumaturia or debris in her urine.  Symptoms from UTI include urinary urgency, frequency, and foul-smelling urine.  She denies dysuria.  She does have occasional gross hematuria and even occasional clots associated with infection.  She does not have gross hematuria and absence of infection.  She does have a personal history of kidney stones.  She is managed to pass all the stones independently.  Her most recent episode of flank pain was 2 years ago at which time she was evaluated in the emergency room.  She had a renal ultrasound at that time demonstrating multiple bilateral renal stones as well as moderate right and severe left hydronephrosis.  The large 14 mm stone within the left renal pelvis appeared to be obstructing.  She denies ever having follow-up for this.  She reports that the unilateral leg pain she was experiencing resolved spontaneously the following day after she passed a stone.  No recent flank  pain.  She has a personal history of external beam radiation x 6 followed by vaginal brachytherapy 2005 for cervical cancer.  She was seen Dr. Terance Hart at [facility] dx with radiation cysttis shortly after this visit.  Her last visit at [facility] was greater than 10 years ago.  She believes she had a cystoscopy requiring a pediatric scope for further evaluation.  She is a personal history of clot retention secondary to radiation cystitis in the remote past.  She has a personal history of vaginal stenosis.  She is not sexually active.  She provides a specimen as voideded.  She has severe mobility limitations secondary to severe rheumatoid arthritis with limited hip and pelvic mobility as well as hand arthritis.    PMH: Past Medical History:  Diagnosis Date  . Arthritis   . Cervical cancer ([place])   . Hypertension     Surgical History: Past Surgical History:  Procedure Laterality Date  . ABDOMINAL HYSTERECTOMY  09/10/2003  . TOTAL HIP ARTHROPLASTY Right 1981  . TOTAL HIP ARTHROPLASTY Left 1990  . TOTAL KNEE ARTHROPLASTY Right     Home Medications:  Allergies as of 07/15/2018      Reactions   Codeine Nausea Only, Nausea And Vomiting   Sulfa Antibiotics Rash   Mouth ulcers      Medication List        Accurate as of 07/15/18  7:54 PM. Always use your most recent med list.          cefUROXime 500 MG tablet Commonly known as:  CEFTIN Take 500 mg by mouth 2 (two) times daily with a meal.   diclofenac 75 MG EC tablet Commonly known as:  VOLTAREN TAKE 1 TABLET BY MOUTH TWICE A DAY   HYDROcodone-acetaminophen 7.5-325 MG tablet Commonly known as:  NORCO Take 1 tablet by mouth every 6 (six) hours as needed for moderate pain. Reviewed controlled substance record in PMP Aware.   metoprolol tartrate 25 MG tablet Commonly known as:  LOPRESSOR TAKE 1 TABLET (25 MG TOTAL) BY MOUTH 2 (TWO) TIMES DAILY.       Allergies:  Allergies  Allergen Reactions  . Codeine Nausea Only and Nausea And  Vomiting  . Sulfa Antibiotics Rash    Mouth ulcers    Family History: Family History  Problem Relation Age of Onset  . Breast cancer Mother   . Hypertension Father   . Cancer Father   . Prostate cancer Neg Hx   . Kidney cancer Neg Hx   . Bladder Cancer Neg Hx     Social History:  reports that she quit smoking about 28 years ago. She quit after 20.00 years of use. She has never used smokeless tobacco. She reports that she does not drink alcohol or use drugs.  ROS: UROLOGY Frequent Urination?: Yes Hard to postpone urination?: No Burning/pain with urination?: Yes Get up at night to urinate?: Yes Leakage of urine?: Yes Urine stream starts and stops?: No Trouble starting stream?: No Do you have to strain to urinate?: No Blood in urine?: Yes Urinary tract infection?: Yes Sexually transmitted disease?: No Injury to kidneys or bladder?: No Painful intercourse?: No Weak stream?: No Currently pregnant?: No Vaginal bleeding?: No Last menstrual period?: hysterectomy   Gastrointestinal Nausea?: Yes Vomiting?: No Indigestion/heartburn?: No Diarrhea?: Yes Constipation?: Yes  Constitutional Fever: No Night sweats?: No Weight loss?: No Fatigue?: No  Skin Skin rash/lesions?: No Itching?: No  Eyes Blurred vision?: No Double vision?: No  Ears/Nose/Throat Sore throat?: No Sinus problems?: No  Hematologic/Lymphatic Swollen glands?: No Easy bruising?: No  Cardiovascular Leg swelling?: No Chest pain?: No  Respiratory Cough?: No Shortness of breath?: No  Endocrine Excessive thirst?: No  Musculoskeletal Back pain?: Yes Joint pain?: Yes  Neurological Headaches?: No Dizziness?: No  Psychologic Depression?: No Anxiety?: No  Physical Exam: BP (!) 147/86 (BP Location: Left Arm)   Pulse (!) 102   Ht 5\' 3"  (1.6 m)   Wt 156 lb 6.4 oz (70.9 kg)   BMI 27.71 kg/m   Constitutional:  Alert and oriented, No acute distress.  Tearful at times today.  Accompanied  by daughter.   HEENT: [place] AT, moist mucus membranes.  Trachea midline, no masses. Cardiovascular: No clubbing, cyanosis, or edema. Respiratory: Normal respiratory effort, no increased work of breathing. MSK: Bilateral MCP deformities. Skin: No rashes, bruises or suspicious lesions. Neurologic: Grossly intact, no focal deficits, moving all 4 extremities.  Abnormal gait. Psychiatric: Normal mood and affect.  Laboratory Data: Lab Results  Component Value Date   WBC 7.9 01/16/2018   HGB 15.0 01/16/2018   HCT 44.8 01/16/2018   MCV 88 01/16/2018   PLT 306 01/16/2018    Lab Results  Component Value Date   CREATININE 0.81 01/16/2018    Urinalysis Results for orders placed or performed in visit on 07/15/18  Microscopic Examination  Result Value Ref Range   WBC, UA 6-10 (A) 0 - 5 /hpf   RBC, UA 0-2 0 - 2 /hpf   Epithelial Cells (non renal) None seen 0 - 10 /hpf  Mucus, UA Present (A) Not Estab.   Bacteria, UA Many (A) None seen/Few  Urinalysis, Complete  Result Value Ref Range   Specific Gravity, UA 1.025 1.005 - 1.030   pH, UA 5.5 5.0 - 7.5   Color, UA Yellow Yellow   Appearance Ur Cloudy (A) Clear   Leukocytes, UA 1+ (A) Negative   Protein, UA 1+ (A) Negative/Trace   Glucose, UA Negative Negative   Ketones, UA Negative Negative   RBC, UA 1+ (A) Negative   Bilirubin, UA Negative Negative   Urobilinogen, Ur 1.0 0.2 - 1.0 mg/dL   Nitrite, UA Positive (A) Negative   Microscopic Examination See below:   Bladder Scan (Post Void Residual) in office  Result Value Ref Range   Scan Result 0 ml     Pertinent Imaging: Results for orders placed during the hospital encounter of 06/07/16  US Renal   Narrative CLINICAL DATA:  67 year old female with right flank pain.  EXAM: RENAL / URINARY TRACT ULTRASOUND COMPLETE  COMPARISON:  None.  FINDINGS: Right Kidney:  Length: 9.5 cm. The right kidney is mildly atrophic. There is moderate right hydronephrosis. There is a 7 mm  calculus in the upper pole of the right kidney. Multiple smaller stones noted in the inferior pole of the right kidney.  Left Kidney:  Length: 11 cm. The left kidney is mildly atrophic. There is severe left hydronephrosis. There is a stone within the left renal pelvis at the ureteropelvic junction measuring approximately 12 x 13 x 14 mm.  Bladder:  The urinary bladder is collapsed and not well visualized.  IMPRESSION: Moderate right and severe left hydronephrosis.  Multiple bilateral renal calculi. The visualized right renal calculi do not appear to be obstructing. Large calculus within the left renal pelvis at the UPJ is concerning for an obstructing stone.   Electronically Signed   By: Anner Crete M.D.   On: 06/08/2016 02:02     Results for orders placed or performed in visit on 07/15/18  Microscopic Examination  Result Value Ref Range   WBC, UA 6-10 (A) 0 - 5 /hpf   RBC, UA 0-2 0 - 2 /hpf   Epithelial Cells (non renal) None seen 0 - 10 /hpf   Mucus, UA Present (A) Not Estab.   Bacteria, UA Many (A) None seen/Few  Urinalysis, Complete  Result Value Ref Range   Specific Gravity, UA 1.025 1.005 - 1.030   pH, UA 5.5 5.0 - 7.5   Color, UA Yellow Yellow   Appearance Ur Cloudy (A) Clear   Leukocytes, UA 1+ (A) Negative   Protein, UA 1+ (A) Negative/Trace   Glucose, UA Negative Negative   Ketones, UA Negative Negative   RBC, UA 1+ (A) Negative   Bilirubin, UA Negative Negative   Urobilinogen, Ur 1.0 0.2 - 1.0 mg/dL   Nitrite, UA Positive (A) Negative   Microscopic Examination See below:   Bladder Scan (Post Void Residual) in office  Result Value Ref Range   Scan Result 0 ml      Assessment & Plan:    1. Recurrent UTI Multiple risk factors recurrent urinary tract infections, primarily concerns for anatomic changes related to radiation including vaginal stenosis, stenosis, abnormal external genitalia with atrophic vaginitis.  Minimal concern for fistula  given history  Adequate bladder emptying, PVR minimal  Today is suspicious for infection, although she is currently asymptomatic, urinary frequency improved on Ceftin.  In addition to the above, I am suspicious that she is able to  provide a clean collection given her mobility constraints and presumed abnormal external genitalia.  Vascular that if she has signs or symptoms of infection in the interim, she come in for a catheterized specimen.  We discussed this at length today and the rationale for this.  Her symptoms are somewhat nonspecific, may also in fact be sequela of radiation cystitis.  Important to differentiate between a true infection and radiation cystitis/OAB symptoms.  Will persue work-up for #2/3 as this may also may be contributing factor, depending on the results of the skin, will proceed with pelvic exam and cystoscopy in the office for further evaluation  Patient is agreeable this plan - Urinalysis, Complete - Bladder Scan (Post Void Residual) in office  2. Hydronephrosis, unspecified hydronephrosis type History of hydronephrosis without appropriate follow-up dating back to 2017  For possible ureteral stricture disease possibly from radiation  , Has a personal history of large multiple bilateral stones, will pursue CT noncontrast for further evaluation  Patient's creatinine is normal which is reassuring  - CT RENAL STONE STUDY; Future  3. History of kidney stones As above  4. Gross hematuria No history of gross hematuria, associated with UTIs  Will pursue cystoscopy as per above  5. Radiation cystitis And that many of her urinary symptoms be related to radiation cystitis rather than true bacterial infection, catheterized specimen for samples as per discussion above May consider hyperbaric oxygen in the future if she continues to have recurrent episodes of gross hematuria as a relates to this   Return in about 2 weeks (around 07/29/2018) for review CT scan,  possible cysto/ pelvic.  Hollice Espy, MD  [facility] [address], [place], [place] [address] [phone]  I spent 60 min with this patient of which greater than 50% was spent in counseling and coordination of care with the patient.

## 2018-07-16 DIAGNOSIS — N390 Urinary tract infection, site not specified: Secondary | ICD-10-CM | POA: Diagnosis not present

## 2018-07-17 ENCOUNTER — Encounter: Payer: Self-pay | Admitting: Family Medicine

## 2018-07-17 ENCOUNTER — Ambulatory Visit (INDEPENDENT_AMBULATORY_CARE_PROVIDER_SITE_OTHER): Payer: Medicare HMO

## 2018-07-17 ENCOUNTER — Other Ambulatory Visit: Payer: Self-pay

## 2018-07-17 ENCOUNTER — Ambulatory Visit (INDEPENDENT_AMBULATORY_CARE_PROVIDER_SITE_OTHER): Payer: Medicare HMO | Admitting: Family Medicine

## 2018-07-17 VITALS — BP 120/84 | HR 66 | Temp 98.1°F | Ht 64.0 in | Wt 157.6 lb

## 2018-07-17 VITALS — BP 134/66 | HR 58 | Temp 98.4°F | Ht 63.0 in | Wt 157.6 lb

## 2018-07-17 DIAGNOSIS — I10 Essential (primary) hypertension: Secondary | ICD-10-CM | POA: Diagnosis not present

## 2018-07-17 DIAGNOSIS — Z9889 Other specified postprocedural states: Secondary | ICD-10-CM

## 2018-07-17 DIAGNOSIS — N3020 Other chronic cystitis without hematuria: Secondary | ICD-10-CM | POA: Diagnosis not present

## 2018-07-17 DIAGNOSIS — Z0000 Encounter for general adult medical examination without abnormal findings: Secondary | ICD-10-CM

## 2018-07-17 DIAGNOSIS — Z87442 Personal history of urinary calculi: Secondary | ICD-10-CM | POA: Diagnosis not present

## 2018-07-17 DIAGNOSIS — Z96651 Presence of right artificial knee joint: Secondary | ICD-10-CM

## 2018-07-17 DIAGNOSIS — Z8541 Personal history of malignant neoplasm of cervix uteri: Secondary | ICD-10-CM | POA: Diagnosis not present

## 2018-07-17 DIAGNOSIS — M0579 Rheumatoid arthritis with rheumatoid factor of multiple sites without organ or systems involvement: Secondary | ICD-10-CM

## 2018-07-17 LAB — POCT URINALYSIS DIPSTICK
BILIRUBIN UA: NEGATIVE
GLUCOSE UA: NEGATIVE
Ketones, UA: NEGATIVE
Nitrite, UA: NEGATIVE
Protein, UA: NEGATIVE
Spec Grav, UA: 1.02 (ref 1.010–1.025)
Urobilinogen, UA: 0.2 E.U./dL
pH, UA: 5 (ref 5.0–8.0)

## 2018-07-17 NOTE — Progress Notes (Signed)
Patient: Jeanne [name], Female    DOB: March 15, 1951, 67 y.o.   MRN: [id number] Visit Date: 07/17/2018  Today's Provider: [name], PA   Chief Complaint  Patient presents with  . Annual Exam  . Urinary Tract Infection    still on antibiotics and feels somewhat better   Subjective:    Annual physical exam Jeanne [name] is a 67 y.o. female who presents today for health maintenance and complete physical. She feels well. She reports exercising is difficult but does some things around the house. She reports she is sleeping well.   -----------------------------------------------------------------  Review of Systems  Constitutional: Negative.   HENT: Positive for tinnitus. Negative for congestion, dental problem, drooling, ear discharge, ear pain, facial swelling, hearing loss, mouth sores, nosebleeds, postnasal drip, rhinorrhea, sinus pressure, sinus pain, sneezing, sore throat, trouble swallowing and voice change.   Eyes: Negative.   Respiratory: Negative.   Cardiovascular: Negative.   Gastrointestinal: Positive for diarrhea. Negative for abdominal distention, abdominal pain, anal bleeding, blood in stool, constipation, nausea, rectal pain and vomiting.  Endocrine: Negative.   Genitourinary: Positive for hematuria and urgency. Negative for difficulty urinating, dyspareunia, dysuria, enuresis, flank pain, frequency, genital sores, menstrual problem and pelvic pain.  Musculoskeletal: Positive for neck stiffness. Negative for arthralgias, back pain, gait problem, joint swelling, myalgias and neck pain.  Skin: Negative.   Allergic/Immunologic: Negative.   Neurological: Negative.   Hematological: Negative.   Psychiatric/Behavioral: Negative.     Social History      She  reports that she quit smoking about 28 years ago. She quit after 20.00 years of use. She has never used smokeless tobacco. She reports that she does not drink alcohol or use drugs.       Social History    Socioeconomic History  . Marital status: Married    Spouse name: Not on file  . Number of children: 0  . Years of education: Not on file  . Highest education level: Some college, no degree  Occupational History  . Occupation: disability  Social Needs  . Financial resource strain: Not hard at all  . Food insecurity:    Worry: Never true    Inability: Never true  . Transportation needs:    Medical: No    Non-medical: No  Tobacco Use  . Smoking status: Former Smoker    Years: 20.00    Last attempt to quit: 09/09/1989    Years since quitting: 28.8  . Smokeless tobacco: Never Used  . Tobacco comment: QUIT IN 1990  Substance and Sexual Activity  . Alcohol use: No    Alcohol/week: 0.0 standard drinks  . Drug use: No  . Sexual activity: Not on file  Lifestyle  . Physical activity:    Days per week: 0 days    Minutes per session: 0 min  . Stress: Only a little  Relationships  . Social connections:    Talks on phone: Patient refused    Gets together: Patient refused    Attends religious service: Patient refused    Active member of club or organization: Patient refused    Attends meetings of clubs or organizations: Patient refused    Relationship status: Patient refused  Other Topics Concern  . Not on file  Social History Narrative  . Not on file   Past Medical History:  Diagnosis Date  . Arthritis   . Cervical cancer ([place])   . Hypertension    Patient Active Problem List  Diagnosis Date Noted  . Recurrent UTI 07/15/2017  . Dysuria 01/28/2017  . Chronic radiation cystitis 02/28/2015  . Coitalgia 02/28/2015  . Blood pressure elevated 02/28/2015  . Calculus of kidney 02/28/2015  . Arthritis or polyarthritis, rheumatoid ([place]) 02/28/2015  . Basal cell papilloma 02/28/2015  . H/O cataract extraction 09/22/2014  . Combined form of senile cataract 07/07/2014  . NS (nuclear sclerosis) 05/21/2014  . History of repair of hip joint 03/16/2013  . H/O total knee  replacement 03/16/2013  . 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 ABDOMINAL HYSTERECTOMY  09/10/2003  . TOTAL HIP ARTHROPLASTY Right 1981  . TOTAL HIP ARTHROPLASTY Left 1990  . TOTAL KNEE ARTHROPLASTY Right    Family History        Family Status  Relation Name Status  . Mother  Deceased at age 46  . Father  Deceased at age 63  . Sister  Alive  . Brother  Alive  . Neg Hx  (Not Specified)        Her family history includes Breast cancer in her mother; Cancer in her father; Hypertension in her father. There is no history of Prostate cancer, Kidney cancer, or Bladder Cancer.      Allergies  Allergen Reactions  . Codeine Nausea Only and Nausea And Vomiting  . Sulfa Antibiotics Rash    Mouth ulcers    Current Outpatient Medications:  .  Aspirin-Caffeine (BC FAST PAIN RELIEF PO), Take by mouth as needed., Disp: , Rfl:  .  cefUROXime (CEFTIN) 500 MG tablet, Take 500 mg by mouth 2 (two) times daily with a meal., Disp: , Rfl:  .  diclofenac (VOLTAREN) 75 MG EC tablet, TAKE 1 TABLET BY MOUTH TWICE A DAY, Disp: 60 tablet, Rfl: 3 .  HYDROcodone-acetaminophen (NORCO) 7.5-325 MG tablet, Take 1 tablet by mouth every 6 (six) hours as needed for moderate pain. Reviewed controlled substance record in PMP Aware., Disp: 30 tablet, Rfl: 0 .  metoprolol tartrate (LOPRESSOR) 25 MG tablet, TAKE 1 TABLET (25 MG TOTAL) BY MOUTH 2 (TWO) TIMES DAILY., Disp: 180 tablet, Rfl: 3   Patient Care Team: Laekyn Rayos, Vickki Muff, PA as PCP - General (Physician Assistant) Hollice Espy, MD as Consulting Physician (Urology) Anell Barr, OD (Optometry)      Objective:   Vitals: BP 120/84 (BP Location: Right Arm, Patient Position: Sitting, Cuff Size: Normal)   Pulse 66   Temp 98.1 F (36.7 C) (Oral)   Ht 5\' 4"  (1.626 m)   Wt 157 lb 9.6 oz (71.5 kg)   SpO2 97%   BMI 27.05 kg/m    Vitals:   07/17/18 1329  BP: 120/84  Pulse: 66  Temp:  98.1 F (36.7 C)  TempSrc: Oral  SpO2: 97%  Weight: 157 lb 9.6 oz (71.5 kg)  Height: 5\' 4"  (1.626 m)    Physical Exam  Constitutional: She is oriented to person, place, and time. She appears well-developed and well-nourished.  HENT:  Head: Normocephalic and atraumatic.  Right Ear: External ear normal.  Left Ear: External ear normal.  Nose: Nose normal.  Mouth/Throat: Oropharynx is clear and moist.  Eyes: Pupils are equal, round, and reactive to light. Conjunctivae and EOM are normal. Right eye exhibits no discharge.  Neck: Normal range of motion. Neck supple. No tracheal deviation present. No thyromegaly present.  Cardiovascular: Normal rate, regular rhythm, normal heart sounds and intact distal pulses.  No murmur heard. Pulmonary/Chest: Effort normal  and breath sounds normal. No respiratory distress. She has no wheezes. She has no rales. She exhibits no tenderness.  Abdominal: Soft. She exhibits no distension and no mass. There is no tenderness. There is no rebound and no guarding.  Genitourinary:  Genitourinary Comments: Hysterectomy in 2004 due to cervical cancer. Scarring from chemotherapy and radiation treatment does not allow vaginal exam.  Musculoskeletal: She exhibits no edema or tenderness.  Gait and Station- Slow stiff tottering gait. Spine, Ribs and Pelvis- Some discomfort in lumbar spine. Mild tenderness without deformities. Very stiff and limited ROM. Right Upper Extremity- Ulnar deviation of fingers at MCP. Very limited ability to grip. Unable to bend finger joints. Stiffness of shoulders and elbows. Left Upper Extremity- Ulnar deviation of MCP joints with straight stiff fingers. Unable to flex into a fist and tender to palpate. Stiffness of shoulders and elbows. Wrists stiff and slightly enlarged. Right Lower Extremity- Hip very stiff with history of joint replacement and knee replacement. Tender to palpate. Left Lower Extremity- Decrease in hip ROM. Pain with  crepitus of the left knee. Decrease ROM (flex only to 80 degrees and extend to 170 degrees). Prominent joint with large scar from past surgery to clean up arthritis and spurs in the 1980's. Upper Extremity- Nodules on elbows. Ulnar deviation of fingers - both hands - with large MCP joints. Extremely diminished ability to flex or extend fingers.Stiffness and tenderness of both ankles. Normal symmetric pulses.   Lymphadenopathy:    She has no cervical adenopathy.  Neurological: She is alert and oriented to person, place, and time. She has normal reflexes. She displays normal reflexes. No cranial nerve deficit. She exhibits normal muscle tone. Coordination normal.  Skin: Skin is warm and dry. No rash noted. No erythema.  Psychiatric: She has a normal mood and affect. Her behavior is normal. Judgment and thought content normal.    Depression Screen PHQ 2/9 Scores 07/17/2018 07/17/2018 07/17/2018 04/23/2017  PHQ - 2 Score 0 0 0 1  PHQ- 9 Score - 1 - 4    Assessment & Plan:     Routine Health Maintenance and Physical Exam  Exercise Activities and Dietary recommendations Goals    . DIET - INCREASE WATER INTAKE     Recommend to drink at least 6-8 8oz glasses of water per day.         There is no immunization history on file for this patient.  Health Maintenance  Topic Date Due  . HIV Screening  12/26/1967  . TETANUS/TDAP  12/26/1971  . PAP SMEAR  12/25/1973  . MAMMOGRAM  12/26/2002  . COLONOSCOPY  08/05/2009  . DEXA SCAN  12/25/2017  . PNA vac Low Risk Adult (1 of 2 - PCV13) 07/18/2019 (Originally 12/25/2017)  . INFLUENZA VACCINE  07/17/2024 (Originally 04/10/2018)  . Hepatitis C Screening  Completed     Discussed health benefits of physical activity, and encouraged her to engage in regular exercise appropriate for her age and condition.    -------------------------------------------------------------------- 1. Chronic cystitis Very frequent UTI's since radiation therapy for  cervical cancer in 2004. Has had 6-7 UTI's this year and had a large (14 mm) stone in the left ureter on U/S in 2017. Had initial evaluation by Dr. Erlene Quan (urologist) on 07-15-18. In the process of getting a CT scan. Still taking the Ceftin 500 mg BID since culture isolated E.coli again on on 07-14-18. Feels the antibiotic is starting to help and UA showed trace of leukocytes with moderate RBC's on dipstick. Recheck CBC  and CMP. Continue follow up with urologist as planned. - POCT Urinalysis Dipstick - CBC with Differential/Platelet - Comprehensive metabolic panel  2. Rheumatoid arthritis involving multiple sites with positive rheumatoid factor (HCC) Onset as a child. All joints very stiff and limited ability to grip with hands. Very waddling gait with very short stride. Check CBC, CMP and TSH. Continues to use Norco 30 tablets /month and Voltaren-EC to control pain. This regimen helps her move a little better. - CBC with Differential/Platelet - Comprehensive metabolic panel - TSH  3. History of repair of hip joint Had right hip arthroplasty in 1981 and left hip arthroplasty in 1990. Still very stiff and waddling gait. Can't stand long without resting and uses a motorized chair to get around when shopping or for long distance ambulation.  4. Hx of total knee replacement, right Had right knee replacement in 1990 due to severe degeneration secondary to rheumatoid arthritis.  5. History of cervical cancer Diagnosed and had hysterectomy with chemotherapy and radiation for 6 weeks in 2004. Adenosquamous carcinoma of cervix, Stage I-B, Grade 1. Recheck routine labs. - CBC with Differential/Platelet  6. Hx of renal calculi Followed by Dr.Brandon. No hematuria or fever. No back/flank pain. Recheck labs and continue urology follow up . - CBC with Differential/Platelet - Comprehensive metabolic panel  7. Essential hypertension Well controlled and continues to take the Metoprolol Tartrate 25 mg BID  without side effects. Will check labs and follow up pending reports. - CBC with Differential/Platelet - Comprehensive metabolic panel - Lipid panel - TSH    [name], PA  [facility]

## 2018-07-17 NOTE — Patient Instructions (Addendum)
Jeanne [name] , Thank you for taking time to come for your Medicare Wellness Visit. I appreciate your ongoing commitment to your health goals. Please review the following plan we discussed and let me know if I can assist you in the future.   Screening recommendations/referrals: Colonoscopy: Pt declines referral today.  Mammogram: Pt declines referral today.  Bone Density: Pt declines referral today. Recommended yearly ophthalmology/optometry visit for glaucoma screening and checkup Recommended yearly dental visit for hygiene and checkup  Vaccinations: Influenza vaccine: Pt declines today.  Pneumococcal vaccine: Pt declines today.  Tdap vaccine: Pt declines today.  Shingles vaccine: Pt declines today.     Advanced directives: Please bring a copy of your POA (Power of Attorney) and/or Living Will to your next appointment.   Conditions/risks identified: Recommend to drink at least 6-8 8oz glasses of water per day.  Next appointment: 1:20 AM today with Jeanne [name].    Preventive Care 57 Years and Older, Female Preventive care refers to lifestyle choices and visits with your health care provider that can promote health and wellness. What does preventive care include?  A yearly physical exam. This is also called an annual well check.  Dental exams once or twice a year.  Routine eye exams. Ask your health care provider how often you should have your eyes checked.  Personal lifestyle choices, including:  Daily care of your teeth and gums.  Regular physical activity.  Eating a healthy diet.  Avoiding tobacco and drug use.  Limiting alcohol use.  Practicing safe sex.  Taking low-dose aspirin every day.  Taking vitamin and mineral supplements as recommended by your health care provider. What happens during an annual well check? The services and screenings done by your health care provider during your annual well check will depend on your age, overall health, lifestyle risk  factors, and family history of disease. Counseling  Your health care provider may ask you questions about your:  Alcohol use.  Tobacco use.  Drug use.  Emotional well-being.  Home and relationship well-being.  Sexual activity.  Eating habits.  History of falls.  Memory and ability to understand (cognition).  Work and work Statistician.  Reproductive health. Screening  You may have the following tests or measurements:  Height, weight, and BMI.  Blood pressure.  Lipid and cholesterol levels. These may be checked every 5 years, or more frequently if you are over 10 years old.  Skin check.  Lung cancer screening. You may have this screening every year starting at age 2 if you have a 30-pack-year history of smoking and currently smoke or have quit within the past 15 years.  Fecal occult blood test (FOBT) of the stool. You may have this test every year starting at age 25.  Flexible sigmoidoscopy or colonoscopy. You may have a sigmoidoscopy every 5 years or a colonoscopy every 10 years starting at age 43.  Hepatitis C blood test.  Hepatitis B blood test.  Sexually transmitted disease (STD) testing.  Diabetes screening. This is done by checking your blood sugar (glucose) after you have not eaten for a while (fasting). You may have this done every 1-3 years.  Bone density scan. This is done to screen for osteoporosis. You may have this done starting at age 70.  Mammogram. This may be done every 1-2 years. Talk to your health care provider about how often you should have regular mammograms. Talk with your health care provider about your test results, treatment options, and if necessary, the need for  more tests. Vaccines  Your health care provider may recommend certain vaccines, such as:  Influenza vaccine. This is recommended every year.  Tetanus, diphtheria, and acellular pertussis (Tdap, Td) vaccine. You may need a Td booster every 10 years.  Zoster vaccine. You  may need this after age 36.  Pneumococcal 13-valent conjugate (PCV13) vaccine. One dose is recommended after age 16.  Pneumococcal polysaccharide (PPSV23) vaccine. One dose is recommended after age 64. Talk to your health care provider about which screenings and vaccines you need and how often you need them. This information is not intended to replace advice given to you by your health care provider. Make sure you discuss any questions you have with your health care provider. Document Released: 09/23/2015 Document Revised: 05/16/2016 Document Reviewed: 06/28/2015 Elsevier Interactive Patient Education  2017 [place] Prevention in the Home Falls can cause injuries. They can happen to people of all ages. There are many things you can do to make your home safe and to help prevent falls. What can I do on the outside of my home?  Regularly fix the edges of walkways and driveways and fix any cracks.  Remove anything that might make you trip as you walk through a door, such as a raised step or threshold.  Trim any bushes or trees on the path to your home.  Use bright outdoor lighting.  Clear any walking paths of anything that might make someone trip, such as rocks or tools.  Regularly check to see if handrails are loose or broken. Make sure that both sides of any steps have handrails.  Any raised decks and porches should have guardrails on the edges.  Have any leaves, snow, or ice cleared regularly.  Use sand or salt on walking paths during winter.  Clean up any spills in your garage right away. This includes oil or grease spills. What can I do in the bathroom?  Use night lights.  Install grab bars by the toilet and in the tub and shower. Do not use towel bars as grab bars.  Use non-skid mats or decals in the tub or shower.  If you need to sit down in the shower, use a plastic, non-slip stool.  Keep the floor dry. Clean up any water that spills on the floor as soon as  it happens.  Remove soap buildup in the tub or shower regularly.  Attach bath mats securely with double-sided non-slip rug tape.  Do not have throw rugs and other things on the floor that can make you trip. What can I do in the bedroom?  Use night lights.  Make sure that you have a light by your bed that is easy to reach.  Do not use any sheets or blankets that are too big for your bed. They should not hang down onto the floor.  Have a firm chair that has side arms. You can use this for support while you get dressed.  Do not have throw rugs and other things on the floor that can make you trip. What can I do in the kitchen?  Clean up any spills right away.  Avoid walking on wet floors.  Keep items that you use a lot in easy-to-reach places.  If you need to reach something above you, use a strong step stool that has a grab bar.  Keep electrical cords out of the way.  Do not use floor polish or wax that makes floors slippery. If you must use wax, use non-skid  floor wax.  Do not have throw rugs and other things on the floor that can make you trip. What can I do with my stairs?  Do not leave any items on the stairs.  Make sure that there are handrails on both sides of the stairs and use them. Fix handrails that are broken or loose. Make sure that handrails are as long as the stairways.  Check any carpeting to make sure that it is firmly attached to the stairs. Fix any carpet that is loose or worn.  Avoid having throw rugs at the top or bottom of the stairs. If you do have throw rugs, attach them to the floor with carpet tape.  Make sure that you have a light switch at the top of the stairs and the bottom of the stairs. If you do not have them, ask someone to add them for you. What else can I do to help prevent falls?  Wear shoes that:  Do not have high heels.  Have rubber bottoms.  Are comfortable and fit you well.  Are closed at the toe. Do not wear sandals.  If  you use a stepladder:  Make sure that it is fully opened. Do not climb a closed stepladder.  Make sure that both sides of the stepladder are locked into place.  Ask someone to hold it for you, if possible.  Clearly mark and make sure that you can see:  Any grab bars or handrails.  First and last steps.  Where the edge of each step is.  Use tools that help you move around (mobility aids) if they are needed. These include:  Canes.  Walkers.  Scooters.  Crutches.  Turn on the lights when you go into a dark area. Replace any light bulbs as soon as they burn out.  Set up your furniture so you have a clear path. Avoid moving your furniture around.  If any of your floors are uneven, fix them.  If there are any pets around you, be aware of where they are.  Review your medicines with your doctor. Some medicines can make you feel dizzy. This can increase your chance of falling. Ask your doctor what other things that you can do to help prevent falls. This information is not intended to replace advice given to you by your health care provider. Make sure you discuss any questions you have with your health care provider. Document Released: 06/23/2009 Document Revised: 02/02/2016 Document Reviewed: 10/01/2014 Elsevier Interactive Patient Education  2017 [employer].

## 2018-07-17 NOTE — Progress Notes (Addendum)
Subjective:   Jeanne [name] is a 67 y.o. female who presents for an Initial Medicare Annual Wellness Visit.  Review of Systems    N/A  Cardiac Risk Factors include: advanced age (>46men, >44 women);hypertension     Objective:    Today's Vitals   07/17/18 1053  BP: 134/66  Pulse: (!) 58  Temp: 98.4 F (36.9 C)  TempSrc: Oral  Weight: 157 lb 9.6 oz (71.5 kg)  Height: 5\' 3"  (1.6 m)  PainSc: 0-No pain   Body mass index is 27.92 kg/m.  Advanced Directives 07/17/2018 06/07/2016 03/01/2015  Does Patient Have a Medical Advance Directive? Yes No No  Type of Paramedic of [place];Living will - -  Copy of [place] in Chart? No - copy requested - -    Current Medications (verified) Outpatient Encounter Medications as of 07/17/2018  Medication Sig  . Aspirin-Caffeine (BC FAST PAIN RELIEF PO) Take by mouth as needed.  . cefUROXime (CEFTIN) 500 MG tablet Take 500 mg by mouth 2 (two) times daily with a meal.  . diclofenac (VOLTAREN) 75 MG EC tablet TAKE 1 TABLET BY MOUTH TWICE A DAY  . HYDROcodone-acetaminophen (NORCO) 7.5-325 MG tablet Take 1 tablet by mouth every 6 (six) hours as needed for moderate pain. Reviewed controlled substance record in PMP Aware.  . metoprolol tartrate (LOPRESSOR) 25 MG tablet TAKE 1 TABLET (25 MG TOTAL) BY MOUTH 2 (TWO) TIMES DAILY.   No facility-administered encounter medications on file as of 07/17/2018.     Allergies (verified) Codeine and Sulfa antibiotics   History: Past Medical History:  Diagnosis Date  . Arthritis   . Cervical cancer ([place])   . Hypertension    Past Surgical History:  Procedure Laterality Date  . ABDOMINAL HYSTERECTOMY  09/10/2003  . TOTAL HIP ARTHROPLASTY Right 1981  . TOTAL HIP ARTHROPLASTY Left 1990  . TOTAL KNEE ARTHROPLASTY Right    Family History  Problem Relation Age of Onset  . Breast cancer Mother   . Hypertension Father   . Cancer Father   . Prostate cancer Neg  Hx   . Kidney cancer Neg Hx   . Bladder Cancer Neg Hx    Social History   Socioeconomic History  . Marital status: Married    Spouse name: Not on file  . Number of children: 0  . Years of education: Not on file  . Highest education level: Some college, no degree  Occupational History  . Occupation: disability  Social Needs  . Financial resource strain: Not hard at all  . Food insecurity:    Worry: Never true    Inability: Never true  . Transportation needs:    Medical: No    Non-medical: No  Tobacco Use  . Smoking status: Former Smoker    Years: 20.00    Last attempt to quit: 09/09/1989    Years since quitting: 28.8  . Smokeless tobacco: Never Used  . Tobacco comment: QUIT IN 1990  Substance and Sexual Activity  . Alcohol use: No    Alcohol/week: 0.0 standard drinks  . Drug use: No  . Sexual activity: Not on file  Lifestyle  . Physical activity:    Days per week: 0 days    Minutes per session: 0 min  . Stress: Only a little  Relationships  . Social connections:    Talks on phone: Patient refused    Gets together: Patient refused    Attends religious service: Patient refused  Active member of club or organization: Patient refused    Attends meetings of clubs or organizations: Patient refused    Relationship status: Patient refused  Other Topics Concern  . Not on file  Social History Narrative  . Not on file    Tobacco Counseling Counseling given: Not Answered Comment: QUIT IN 1990   Clinical Intake:  Pre-visit preparation completed: Yes  Pain : No/denies pain Pain Score: 0-No pain(Pt states she has chronic arthritis pain. )     Nutritional Status: BMI 25 -29 Overweight Nutritional Risks: None Diabetes: No  How often do you need to have someone help you when you read instructions, pamphlets, or other written materials from your doctor or pharmacy?: 1 - Never  Interpreter Needed?: No  Information entered by :: [facility], LPN   Activities of  Daily Living In your present state of health, do you have any difficulty performing the following activities: 07/17/2018  Hearing? N  Vision? N  Difficulty concentrating or making decisions? N  Walking or climbing stairs? Y  Comment Due to arthritis pain.   Dressing or bathing? Y  Comment Has assistance getting pants on due to pain.   Doing errands, shopping? N  Preparing Food and eating ? N  Using the Toilet? N  In the past six months, have you accidently leaked urine? Y  Comment Yes, due to radiation hx. Wears pads daily.   Do you have problems with loss of bowel control? N  Managing your Medications? N  Managing your Finances? N  Housekeeping or managing your Housekeeping? N  Some recent data might be hidden     Immunizations and Health Maintenance  There is no immunization history on file for this patient. Health Maintenance Due  Topic Date Due  . HIV Screening  12/26/1967  . TETANUS/TDAP  12/26/1971  . PAP SMEAR  12/25/1973  . MAMMOGRAM  12/26/2002  . COLONOSCOPY  08/05/2009  . DEXA SCAN  12/25/2017  . PNA vac Low Risk Adult (1 of 2 - PCV13) 12/25/2017  . INFLUENZA VACCINE  04/10/2018    Patient Care Team: Chrismon, Vickki Muff, PA as PCP - General (Physician Assistant) Hollice Espy, MD as Consulting Physician (Urology) Anell Barr, OD (Optometry)  Indicate any recent Medical Services you may have received from other than [facility] providers in the past year (date may be approximate).     Assessment:   This is a routine wellness examination for Demeisha.  Hearing/Vision screen No exam data present  Dietary issues and exercise activities discussed: Current Exercise Habits: The patient does not participate in regular exercise at present, Exercise limited by: orthopedic condition(s)  Goals    . DIET - INCREASE WATER INTAKE     Recommend to drink at least 6-8 8oz glasses of water per day.        Depression Screen PHQ 2/9 Scores 07/17/2018 07/17/2018  04/23/2017  PHQ - 2 Score 0 0 1  PHQ- 9 Score 1 - 4    Fall Risk Fall Risk  07/17/2018 04/23/2017  Falls in the past year? 0 No    FALL RISK PREVENTION PERTAINING TO THE HOME:  Any stairs in or around the home WITH handrails? No  Home free of loose throw rugs in walkways, pet beds, electrical cords, etc? Yes  Adequate lighting in your home to reduce risk of falls? Yes   ASSISTIVE DEVICES UTILIZED TO PREVENT FALLS:  Life alert? No  Use of a cane, walker or w/c? Yes  Grab bars  in the bathroom? No  Shower chair or bench in shower? Yes  Elevated toilet seat or a handicapped toilet? Yes    TIMED UP AND GO:  Was the test performed? No .     Cognitive Function: Declined today.         Screening Tests Health Maintenance  Topic Date Due  . HIV Screening  12/26/1967  . TETANUS/TDAP  12/26/1971  . PAP SMEAR  12/25/1973  . MAMMOGRAM  12/26/2002  . COLONOSCOPY  08/05/2009  . DEXA SCAN  12/25/2017  . PNA vac Low Risk Adult (1 of 2 - PCV13) 12/25/2017  . INFLUENZA VACCINE  04/10/2018  . Hepatitis C Screening  Completed    Qualifies for Shingles Vaccine? Yes . Due for Shingrix. Education has been provided regarding the importance of this vaccine. Pt has been advised to call insurance company to determine out of pocket expense. Advised may also receive vaccine at local pharmacy or Health Dept. Verbalized acceptance and understanding.  Tdap: Although this vaccine is not a covered service during a Wellness Exam, does the patient still wish to receive this vaccine today?  No .  Education has been provided regarding the importance of this vaccine. Advised may receive this vaccine at local pharmacy or Health Dept. Aware to provide a copy of the vaccination record if obtained from local pharmacy or Health Dept. Verbalized acceptance and understanding.  Flu Vaccine: Due for Flu vaccine. Does the patient want to receive this vaccine today?  No . Education has been provided regarding the  importance of this vaccine but still declined. Advised may receive this vaccine at local pharmacy or Health Dept. Aware to provide a copy of the vaccination record if obtained from local pharmacy or Health Dept. Verbalized acceptance and understanding.  Pneumococcal Vaccine: Due for Pneumococcal vaccine. Does the patient want to receive this vaccine today?  No . Education has been provided regarding the importance of this vaccine but still declined. Advised may receive this vaccine at local pharmacy or Health Dept. Aware to provide a copy of the vaccination record if obtained from local pharmacy or Health Dept. Verbalized acceptance and understanding.   Cancer Screenings:  Colorectal Screening: Pt declines referral today.   Mammogram: Pt declines referral today.  Bone Density: Pt declines referral today.  Lung Cancer Screening: (Low Dose CT Chest recommended if Age 53-80 years, 30 pack-year currently smoking OR have quit w/in 15years.) does not qualify.   Additional Screening:  Hepatitis C Screening: Up to date  Vision Screening: Recommended annual ophthalmology exams for early detection of glaucoma and other disorders of the eye.  Dental Screening: Recommended annual dental exams for proper oral hygiene  Community Resource Referral:  CRR required this visit?  No      Plan:  I have personally reviewed and addressed the Medicare Annual Wellness questionnaire and have noted the following in the patient's chart:  A. Medical and social history B. Use of alcohol, tobacco or illicit drugs  C. Current medications and supplements D. Functional ability and status E.  Nutritional status F.  Physical activity G. Advance directives H. List of other physicians I.  Hospitalizations, surgeries, and ER visits in previous 12 months J.  [place] such as hearing and vision if needed, cognitive and depression L. Referrals and appointments - none  In addition, I have reviewed and  discussed with patient certain preventive protocols, quality metrics, and best practice recommendations. A written personalized care plan for preventive services as well as  general preventive health recommendations were provided to patient.  See attached scanned questionnaire for additional information.   Signed,  Fabio Neighbors, LPN Nurse Health Advisor   Nurse Recommendations: Pt declined a DEXA referral, colonoscopy referral, mammogram order, HIV lab, tetanus, pneumonia and influenza vaccines today.   Reviewed Nurse Health Advisor screening documentation and recommendations. Patient in the process of evaluation for large renal stones and recurrent UTI's. Will consider immunizations and screening tests after urology tests.

## 2018-07-19 LAB — CULTURE, URINE COMPREHENSIVE

## 2018-07-21 ENCOUNTER — Telehealth: Payer: Self-pay

## 2018-07-21 NOTE — Telephone Encounter (Signed)
-----   Message from Hollice Espy, MD sent at 07/21/2018  8:49 AM EST ----- At the time of her visit, she was on antibiotics and her symptoms are improving.  If this continues to be the case, do not not want to give her any additional antibiotics.    If her symptoms are worsening, I would like her to come in for a catheterized UA/urine culture to be repeated.  This urine culture was obtained via voided specimen and certainly is contaminated.  Hollice Espy, MD

## 2018-07-28 ENCOUNTER — Ambulatory Visit
Admission: RE | Admit: 2018-07-28 | Discharge: 2018-07-28 | Disposition: A | Discharge status: Home / Self Care | Payer: Medicare HMO | Source: Ambulatory Visit | Attending: Urology | Admitting: Urology

## 2018-07-28 DIAGNOSIS — N132 Hydronephrosis with renal and ureteral calculous obstruction: Secondary | ICD-10-CM | POA: Diagnosis not present

## 2018-07-28 DIAGNOSIS — N1330 Unspecified hydronephrosis: Secondary | ICD-10-CM | POA: Diagnosis present

## 2018-07-29 NOTE — Progress Notes (Deleted)
07/30/18  CC: No chief complaint on file.   HPI:  Jeanne [name] is a 67 yo F who returns today for the evaluation and management of radiation cystitis and recurrent UTIs.   Over the past year or so, she has been treated at least 5-7 times for urinary tract infection.  She is growing various organisms including E. Coli (primarily), as well as enterococcus and Pseudomonas.  These have variable resistance pattern.  Most recently, she was seen and evaluated on Thursday with a symptomatic UTI growing E. coli currently on Ceftin as prescribed by her primary care physician.  She has been having urinary tract infections for years, but more recurrent over the recent past.  No febrile UTIs.  No history of pyelonephritis or admissions for infection  She has been having occasional bouts of diarrhea.  She denies any pneumaturia or debris in her urine.  Symptoms from UTI include urinary urgency, frequency, and foul-smelling urine.  She denies dysuria.  She does have occasional gross hematuria and even occasional clots associated with infection.  She does not have gross hematuria and absence of infection.  She does have a personal history of kidney stones.  She is managed to pass all the stones independently.  Her most recent episode of flank pain was 2 years ago at which time she was evaluated in the emergency room.  She had a renal ultrasound at that time demonstrating multiple bilateral renal stones as well as moderate right and severe left hydronephrosis.  The large 14 mm stone within the left renal pelvis appeared to be obstructing.  She denies ever having follow-up for this.  She reports that the unilateral leg pain she was experiencing resolved spontaneously the following day after she passed a stone.  No recent flank pain.  She has a personal history of external beam radiation x 6 followed by vaginal brachytherapy 2005 for cervical cancer.  She was seen Dr. Terance Hart at [facility] dx with radiation  cysttis shortly after this visit.  Her last visit at [facility] was greater than 10 years ago.  She believes she had a cystoscopy requiring a pediatric scope for further evaluation.  She is a personal history of clot retention secondary to radiation cystitis in the remote past.  She has a personal history of vaginal stenosis.  She is not sexually active.  She provides a specimen as voideded.  She has severe mobility limitations secondary to severe rheumatoid arthritis with limited hip and pelvic mobility as well as hand arthritis.  There were no vitals taken for this visit. NED. A&Ox3.   No respiratory distress   Abd soft, NT, ND Normal external genitalia with patent urethral meatus  Cystoscopy Procedure Note  Patient identification was confirmed, informed consent was obtained, and patient was prepped using Betadine solution.  Lidocaine jelly was administered per urethral meatus.    Procedure: - Flexible cystoscope introduced, without any difficulty.   - Thorough search of the bladder revealed:    normal urethral meatus    normal urothelium    no stones    no ulcers     no tumors    no urethral polyps    no trabeculation  - Ureteral orifices were normal in position and appearance.  Post-Procedure: - Patient tolerated the procedure well  Pertinent Imagings: CLINICAL DATA:  Right flank pain. Nephrolithiasis. Recurrent bladder infections. Personal history of cervical carcinoma and radiation cystitis.  EXAM: CT ABDOMEN AND PELVIS WITHOUT CONTRAST  TECHNIQUE: Multidetector CT imaging of the  abdomen and pelvis was performed following the standard protocol without IV contrast.  COMPARISON:  Ultrasound on 06/08/2016  FINDINGS: Lower Chest: 6 mm noncalcified pulmonary nodule seen in right lower lobe.  Hepatobiliary: No hepatic masses identified. Gallbladder is unremarkable.  Pancreas: No mass or inflammatory changes. Diffuse fatty replacement noted.  Spleen: Within  normal limits in size and appearance.  Adrenals/Urinary Tract: Several renal calculi are seen in both kidneys. Several right renal parapelvic cysts are noted, but there is no convincing evidence of hydronephrosis. No evidence of ureteral calculi or dilatation.  Severe left hydronephrosis is seen with 2 calculi in the left renal pelvis near the UPJ, largest measuring 10 mm. No evidence of ureteral calculi or dilatation. Lower pelvic ureters and urinary bladder are not visualized due to severe artifact from bilateral hip prostheses.  Stomach/Bowel: No evidence of obstruction, inflammatory process or abnormal fluid collections.  Vascular/Lymphatic: No pathologically enlarged lymph nodes. No abdominal aortic aneurysm. Aortic atherosclerosis.  Reproductive: Not well visualized due to severe artifact from bilateral hip prostheses.  Other:  None.  Musculoskeletal: No suspicious bone lesions identified. Chronic appearing compression deformities of T12 and L1 vertebral bodies are noted.  IMPRESSION: Severe left hydronephrosis. 2 calculi in region of left UPJ, largest measuring 10 mm. The hydronephrosis could be due to these calculi or congenital UPJ obstruction. No evidence of ureteral calculi or dilatation.  Several right renal sinus cysts, without definite evidence of hydronephrosis.  Bilateral nephrolithiasis.  Nonvisualization of bladder and other lower pelvic organs due to severe artifact from bilateral hip prostheses.  Electronically Signed: By: Earle Gell M.D. On: 07/28/2018 15:32   Assessment/ Plan:  1. Recurrent UTI  Multiple risk factors recurrent urinary tract infections, primarily concerns for anatomic changes related to radiation including vaginal stenosis, stenosis, abnormal external genitalia with atrophic vaginitis.  Minimal concern for fistula given history  Adequate bladder emptying, PVR minimal  Today is suspicious for infection,  although she is currently asymptomatic, urinary frequency improved on Ceftin.  In addition to the above, I am suspicious that she is able to provide a clean collection given her mobility constraints and presumed abnormal external genitalia.  Vascular that if she has signs or symptoms of infection in the interim, she come in for a catheterized specimen.  We discussed this at length today and the rationale for this.  Her symptoms are somewhat nonspecific, may also in fact be sequela of radiation cystitis.  Important to differentiate between a true infection and radiation cystitis/OAB symptoms.  Will persue work-up for #2/3 as this may also may be contributing factor, depending on the results of the skin, will proceed with pelvic exam and cystoscopy in the office for further evaluation  Patient is agreeable this plan - Urinalysis, Complete - Bladder Scan (Post Void Residual) in office  2. Hydronephrosis, unspecified hydronephrosis type History of hydronephrosis without appropriate follow-up dating back to 2017  For possible ureteral stricture disease possibly from radiation  , Has a personal history of large multiple bilateral stones, will pursue CT noncontrast for further evaluation Patient's creatinine is normal which is reassuring  Persistent severe left hydronephrosis  ?obstructing stone UPJ obstruction   3. History of kidney stones As above  4. Gross hematuria No history of gross hematuria, associated with UTIs  Will pursue cystoscopy as per above  5. Radiation cystitis And that many of her urinary symptoms be related to radiation cystitis rather than true bacterial infection, catheterized specimen for samples as per discussion above May consider hyperbaric  oxygen in the future if she continues to have recurrent episodes of gross hematuria as a relates to this  6. Pulmonary Nodule Return in 6 months for Non contrast CT of chest    Return in about 2 weeks (around  07/29/2018) for review CT scan, possible cysto/ pelvic.    No follow-ups on file.  Dolores Frame, am acting as a scribe for Dr. Hollice Espy,  {Add Scribe Attestation Statement}

## 2018-07-30 ENCOUNTER — Encounter: Payer: Self-pay | Admitting: Urology

## 2018-07-30 ENCOUNTER — Ambulatory Visit: Payer: Medicare HMO | Admitting: Urology

## 2018-07-30 ENCOUNTER — Other Ambulatory Visit: Payer: Self-pay | Admitting: Radiology

## 2018-07-30 ENCOUNTER — Other Ambulatory Visit: Payer: Self-pay

## 2018-07-30 ENCOUNTER — Encounter: Payer: Self-pay | Admitting: Family Medicine

## 2018-07-30 VITALS — BP 167/88 | HR 80 | Wt 158.0 lb

## 2018-07-30 DIAGNOSIS — N390 Urinary tract infection, site not specified: Secondary | ICD-10-CM | POA: Diagnosis not present

## 2018-07-30 DIAGNOSIS — Z87442 Personal history of urinary calculi: Secondary | ICD-10-CM | POA: Diagnosis not present

## 2018-07-30 DIAGNOSIS — R300 Dysuria: Secondary | ICD-10-CM | POA: Diagnosis not present

## 2018-07-30 DIAGNOSIS — Z01812 Encounter for preprocedural laboratory examination: Secondary | ICD-10-CM | POA: Diagnosis not present

## 2018-07-30 DIAGNOSIS — N1330 Unspecified hydronephrosis: Secondary | ICD-10-CM

## 2018-07-30 DIAGNOSIS — R918 Other nonspecific abnormal finding of lung field: Secondary | ICD-10-CM | POA: Diagnosis not present

## 2018-07-30 LAB — URINALYSIS, COMPLETE
Bilirubin, UA: NEGATIVE
Glucose, UA: NEGATIVE
KETONES UA: NEGATIVE
Nitrite, UA: POSITIVE — AB
PH UA: 5.5 (ref 5.0–7.5)
SPEC GRAV UA: 1.025 (ref 1.005–1.030)
Urobilinogen, Ur: 1 mg/dL (ref 0.2–1.0)

## 2018-07-30 LAB — MICROSCOPIC EXAMINATION

## 2018-07-30 NOTE — H&P (View-Only) (Signed)
07/30/2018 9:29 AM   Jeanne [name] 1951/07/27 [id number]  Referring provider: Margo [name], [place], [place] [address]  Chief Complaint  Patient presents with  . Procedure    Cystoscopy    HPI: Jeanne [name] is a 67 yo F who returns today for the evaluation and management of radiation cystitis, hydronephrosis, stones, and recurrent UTIs.  Return to the office today primarily to discuss her CT stone protocol.   CT scan shows severe left hydronephrosis with stones, appear to be nonobstructing.?  UPJ obstruction.  There is also cortical thinning.  She is incidental pulmonary nodules as well.  She declines being told about any history of congenital UPJ obstruction in the past.  She was remotely followed at [facility] for urological issues as below.  Her symptoms today are unchanged.  Previous history:  Over the past year or so, she has been treated at least 5-7 times for urinary tract infection. She is growing various organisms including E. Coli (primarily),as well as enterococcus and Pseudomonas. These have variable resistance pattern. Most recently, she was seen and evaluated on Thursday with a symptomatic UTI growing E. coli currently on Ceftin as prescribed by her primary care physician.  She has been having urinary tract infections foryears, but morerecurrent over the recent past.No febrile UTIs. No history of pyelonephritisoradmissions for infection  She has been having occasional bouts of diarrhea. She denies any pneumaturia or debris in her urine.  Symptoms from UTI include urinary urgency, frequency, and foul-smelling urine. She denies dysuria. She does have occasional gross hematuria and even occasional clots associated with infection. She does not have gross hematuria and absence of infection.  She does have a personal history of kidney stones. She is managed to pass all the stones independently. Her most recent episode of  flank pain was 2 years ago at which time she was evaluated in the emergency room. She had a renal ultrasound at that time demonstrating multiple bilateral renal stones as well as moderate right and severe left hydronephrosis. The large 14 mm stone within the left renal pelvis appeared to be obstructing. She denies ever having follow-up for this. She reports that the unilateral leg pain she was experiencing resolved spontaneously the following day after she passed a stone. No recent flank pain.  She has a personal history of external beam radiation x 6 followed by vaginal brachytherapy 2005 for cervical cancer.She was seenDr. Terance Hart at [facility] with radiation cysttis shortly after this visit. Her last visit at [facility] was greater than 10 years ago.She believes she had a cystoscopy requiring a pediatric scope for further evaluation. She is a personal history of clot retention secondary to radiation cystitis in the remote past.  She has a personal history of vaginal stenosis. She is not sexually active. She provides a specimen as voideded. She has severe mobility limitations secondary to severe rheumatoid arthritis with limited hip and pelvic mobility as well as hand arthritis.   PMH: Past Medical History:  Diagnosis Date  . Arthritis   . Cervical cancer ([place])   . Hypertension     Surgical History: Past Surgical History:  Procedure Laterality Date  . ABDOMINAL HYSTERECTOMY  09/10/2003  . TOTAL HIP ARTHROPLASTY Right 1981  . TOTAL HIP ARTHROPLASTY Left 1990  . TOTAL KNEE ARTHROPLASTY Right     Home Medications:  Allergies as of 07/30/2018      Reactions   Codeine Nausea Only, Nausea And Vomiting   Sulfa Antibiotics Rash  Mouth ulcers      Medication List        Accurate as of 07/30/18  9:29 AM. Always use your most recent med list.          BC FAST PAIN RELIEF PO Take by mouth as needed.   cefUROXime 500 MG tablet Commonly known as:  CEFTIN Take 500 mg by  mouth 2 (two) times daily with a meal.   diclofenac 75 MG EC tablet Commonly known as:  VOLTAREN TAKE 1 TABLET BY MOUTH TWICE A DAY   HYDROcodone-acetaminophen 7.5-325 MG tablet Commonly known as:  NORCO Take 1 tablet by mouth every 6 (six) hours as needed for moderate pain. Reviewed controlled substance record in PMP Aware.   metoprolol tartrate 25 MG tablet Commonly known as:  LOPRESSOR TAKE 1 TABLET (25 MG TOTAL) BY MOUTH 2 (TWO) TIMES DAILY.       Allergies:  Allergies  Allergen Reactions  . Codeine Nausea Only and Nausea And Vomiting  . Sulfa Antibiotics Rash    Mouth ulcers    Family History: Family History  Problem Relation Age of Onset  . Breast cancer Mother   . Hypertension Father   . Cancer Father   . Prostate cancer Neg Hx   . Kidney cancer Neg Hx   . Bladder Cancer Neg Hx     Social History:  reports that she quit smoking about 28 years ago. She quit after 20.00 years of use. She has never used smokeless tobacco. She reports that she does not drink alcohol or use drugs.  ROS: UROLOGY Frequent Urination?: No Hard to postpone urination?: No Burning/pain with urination?: No Get up at night to urinate?: No Leakage of urine?: Yes Urine stream starts and stops?: No Trouble starting stream?: No Do you have to strain to urinate?: No Blood in urine?: No Urinary tract infection?: No Sexually transmitted disease?: No Injury to kidneys or bladder?: No Painful intercourse?: No Weak stream?: No Currently pregnant?: No Vaginal bleeding?: No Last menstrual period?: n  Gastrointestinal Nausea?: No Vomiting?: No Indigestion/heartburn?: No Diarrhea?: No Constipation?: No  Constitutional Fever: No Night sweats?: No Weight loss?: No Fatigue?: No  Skin Skin rash/lesions?: No Itching?: No  Eyes Blurred vision?: No Double vision?: No  Ears/Nose/Throat Sore throat?: No Sinus problems?: No  Hematologic/Lymphatic Swollen glands?: No Easy  bruising?: No  Cardiovascular Leg swelling?: No Chest pain?: No  Respiratory Cough?: No Shortness of breath?: No  Endocrine Excessive thirst?: No  Musculoskeletal Back pain?: No Joint pain?: Yes  Neurological Headaches?: No Dizziness?: No  Psychologic Depression?: No Anxiety?: No  Physical Exam: BP (!) 167/88   Pulse 80   Wt 158 lb (71.7 kg)   BMI 27.12 kg/m   Constitutional:  Alert and oriented, No acute distress.  Accompanied today by husband. HEENT: [place] AT, moist mucus membranes.  Trachea midline, no masses. Cardiovascular: No clubbing, cyanosis, or edema. Respiratory: Normal respiratory effort, no increased work of breathing. Skin: No rashes, bruises or suspicious lesions. Neurologic: Grossly intact, no focal deficits, moving all 4 extremities. Psychiatric: Normal mood and affect.  Laboratory Data: Lab Results  Component Value Date   WBC 7.9 01/16/2018   HGB 15.0 01/16/2018   HCT 44.8 01/16/2018   MCV 88 01/16/2018   PLT 306 01/16/2018    Lab Results  Component Value Date   CREATININE 0.81 01/16/2018     Urinalysis UA reviewed today, see epic.  Appears suspicious again.  Pertinent Imaging: Results for orders placed during the  hospital encounter of 07/28/18  CT RENAL STONE STUDY   Addendum ADDENDUM REPORT: 07/28/2018 15:46  ADDENDUM: Additional item not shown in Impression [name] below:  Other Findings Warranting Further Action:  6 mm indeterminate right lower lobe pulmonary nodule. Non-contrast chest CT at 6-12 months is recommended. If the nodule is stable at time of repeat CT, then future CT at 18-24 months (from today's scan) is considered optional for low-risk patients, but is recommended for high-risk patients. This recommendation follows the consensus statement: Guidelines for Management of Incidental Pulmonary Nodules Detected on CT Images: From the Fleischner Society 2017; Radiology 2017; 284:228-243.   Electronically Signed   By: Earle Gell  M.D.   On: 07/28/2018 15:46       Earle Gell, MD 07/28/2018  3:49 PM         Narrative CLINICAL DATA:  Right flank pain. Nephrolithiasis. Recurrent bladder infections. Personal history of cervical carcinoma and radiation cystitis.  EXAM: CT ABDOMEN AND PELVIS WITHOUT CONTRAST  TECHNIQUE: Multidetector CT imaging of the abdomen and pelvis was performed following the standard protocol without IV contrast.  COMPARISON:  Ultrasound on 06/08/2016  FINDINGS: Lower Chest: 6 mm noncalcified pulmonary nodule seen in right lower lobe.  Hepatobiliary: No hepatic masses identified. Gallbladder is unremarkable.  Pancreas: No mass or inflammatory changes. Diffuse fatty replacement noted.  Spleen: Within normal limits in size and appearance.  Adrenals/Urinary Tract: Several renal calculi are seen in both kidneys. Several right renal parapelvic cysts are noted, but there is no convincing evidence of hydronephrosis. No evidence of ureteral calculi or dilatation.  Severe left hydronephrosis is seen with 2 calculi in the left renal pelvis near the UPJ, largest measuring 10 mm. No evidence of ureteral calculi or dilatation. Lower pelvic ureters and urinary bladder are not visualized due to severe artifact from bilateral hip prostheses.  Stomach/Bowel: No evidence of obstruction, inflammatory process or abnormal fluid collections.  Vascular/Lymphatic: No pathologically enlarged lymph nodes. No abdominal aortic aneurysm. Aortic atherosclerosis.  Reproductive: Not well visualized due to severe artifact from bilateral hip prostheses.  Other:  None.  Musculoskeletal: No suspicious bone lesions identified. Chronic appearing compression deformities of T12 and L1 vertebral bodies are noted.  IMPRESSION: Severe left hydronephrosis. 2 calculi in region of left UPJ, largest measuring 10 mm. The hydronephrosis could be due to these calculi or congenital UPJ obstruction. No evidence of  ureteral calculi or dilatation.  Several right renal sinus cysts, without definite evidence of hydronephrosis.  Bilateral nephrolithiasis.  Nonvisualization of bladder and other lower pelvic organs due to severe artifact from bilateral hip prostheses.  Electronically Signed: By: Earle Gell M.D. On: 07/28/2018 15:32     CT scan personally reviewed today and with the patient.  Assessment & Plan:    1. Recurrent UTI  Will pursue cysto/pelvic exam in OR See below Patient is agreeable this plan - Urinalysis, Complete -UCx  2. Hydronephrosis, unspecified hydronephrosis type History of hydronephrosis without appropriate follow-up dating back to 2017 CT scan with severe left hydronephrosis and cortical thinning, stones DDx includes left UPJ obstruction vs ureteral stricture vs obstructive stone vs ureteral stricture disease possibly from radiation   For further diagnostic evaluation as well as therapeutic intervention, I recommended pursuing pelvic exam, cystoscopy, bilateral retrograde pyelogram, left diagnostic ureteroscopy with possible laser lithotripsy and stent placement in the operating room.  We also discussed the role bladder biopsy should any bladder pathology be identified.    Risks and benefits of ureteroscopy were reviewed including but  not limited to infection, bleeding, pain, ureteral injury which could require open surgery versus prolonged indwelling if ureteral perforation occurs, persistent stone disease, requirement for staged procedure, possible stent, and global anesthesia risks. Patient expressed understanding and desires to proceed with ureteroscopy.  Preop UCx  3. History of kidney stones Multiple stones primarily on left measuring up to 1 cm  Recommend endoscopic management as above  4. Gross hematuria Discussed possible biopsy if pathology identified Will pursue cystoscopy as per above  5. Radiation cystitis See above  6. Pulmonary  Nodule Incidental as above Return in 6 months for Non contrast CT of chest   Pelvic exam and cystoscopy in OR as above  [facility] [address], [place], [place] [address] [phone]  I, Lucas Mallow, am acting as a scribe for Dr. Hollice Espy,  I have reviewed the above documentation for accuracy and completeness, and I agree with the above.   Hollice Espy, MD   I spent 40 min with this patient of which greater than 50% was spent in counseling and coordination of care with the patient.

## 2018-07-30 NOTE — Progress Notes (Addendum)
07/30/2018 9:29 AM   [name] 1951-08-09 [id number]  Referring provider: Margo Common, [place] [place], [place] [address]  Chief Complaint  Patient presents with  . Procedure    Cystoscopy    HPI: Jeanne [name] is a 67 yo F who returns today for the evaluation and management of radiation cystitis, hydronephrosis, stones, and recurrent UTIs.  Return to the office today primarily to discuss her CT stone protocol.   CT scan shows severe left hydronephrosis with stones, appear to be nonobstructing.?  UPJ obstruction.  There is also cortical thinning.  She is incidental pulmonary nodules as well.  She declines being told about any history of congenital UPJ obstruction in the past.  She was remotely followed at [facility] for urological issues as below.  Her symptoms today are unchanged.  Previous history:  Over the past year or so, she has been treated at least 5-7 times for urinary tract infection. She is growing various organisms including E. Coli (primarily),as well as enterococcus and Pseudomonas. These have variable resistance pattern. Most recently, she was seen and evaluated on Thursday with a symptomatic UTI growing E. coli currently on Ceftin as prescribed by her primary care physician.  She has been having urinary tract infections foryears, but morerecurrent over the recent past.No febrile UTIs. No history of pyelonephritisoradmissions for infection  She has been having occasional bouts of diarrhea. She denies any pneumaturia or debris in her urine.  Symptoms from UTI include urinary urgency, frequency, and foul-smelling urine. She denies dysuria. She does have occasional gross hematuria and even occasional clots associated with infection. She does not have gross hematuria and absence of infection.  She does have a personal history of kidney stones. She is managed to pass all the stones independently. Her most recent episode of  flank pain was 2 years ago at which time she was evaluated in the emergency room. She had a renal ultrasound at that time demonstrating multiple bilateral renal stones as well as moderate right and severe left hydronephrosis. The large 14 mm stone within the left renal pelvis appeared to be obstructing. She denies ever having follow-up for this. She reports that the unilateral leg pain she was experiencing resolved spontaneously the following day after she passed a stone. No recent flank pain.  She has a personal history of external beam radiation x 6 followed by vaginal brachytherapy 2005 for cervical cancer.She was seenDr. [facility] at [facility] with radiation cysttis shortly after this visit. Her last visit at [facility] was greater than 10 years ago.She believes she had a cystoscopy requiring a pediatric scope for further evaluation. She is a personal history of clot retention secondary to radiation cystitis in the remote past.  She has a personal history of vaginal stenosis. She is not sexually active. She provides a specimen as voideded. She has severe mobility limitations secondary to severe rheumatoid arthritis with limited hip and pelvic mobility as well as hand arthritis.   PMH: Past Medical History:  Diagnosis Date  . Arthritis   . Cervical cancer ([place])   . Hypertension     Surgical History: Past Surgical History:  Procedure Laterality Date  . ABDOMINAL HYSTERECTOMY  09/10/2003  . TOTAL HIP ARTHROPLASTY Right 1981  . TOTAL HIP ARTHROPLASTY Left 1990  . TOTAL KNEE ARTHROPLASTY Right     Home Medications:  Allergies as of 07/30/2018      Reactions   Codeine Nausea Only, Nausea And Vomiting   Sulfa Antibiotics Rash  Mouth ulcers      Medication List        Accurate as of 07/30/18  9:29 AM. Always use your most recent med list.          BC FAST PAIN RELIEF PO Take by mouth as needed.   cefUROXime 500 MG tablet Commonly known as:  CEFTIN Take 500 mg by  mouth 2 (two) times daily with a meal.   diclofenac 75 MG EC tablet Commonly known as:  VOLTAREN TAKE 1 TABLET BY MOUTH TWICE A DAY   HYDROcodone-acetaminophen 7.5-325 MG tablet Commonly known as:  NORCO Take 1 tablet by mouth every 6 (six) hours as needed for moderate pain. Reviewed controlled substance record in PMP Aware.   metoprolol tartrate 25 MG tablet Commonly known as:  LOPRESSOR TAKE 1 TABLET (25 MG TOTAL) BY MOUTH 2 (TWO) TIMES DAILY.       Allergies:  Allergies  Allergen Reactions  . Codeine Nausea Only and Nausea And Vomiting  . Sulfa Antibiotics Rash    Mouth ulcers    Family History: Family History  Problem Relation Age of Onset  . Breast cancer Mother   . Hypertension Father   . Cancer Father   . Prostate cancer Neg Hx   . Kidney cancer Neg Hx   . Bladder Cancer Neg Hx     Social History:  reports that she quit smoking about 28 years ago. She quit after 20.00 years of use. She has never used smokeless tobacco. She reports that she does not drink alcohol or use drugs.  ROS: UROLOGY Frequent Urination?: No Hard to postpone urination?: No Burning/pain with urination?: No Get up at night to urinate?: No Leakage of urine?: Yes Urine stream starts and stops?: No Trouble starting stream?: No Do you have to strain to urinate?: No Blood in urine?: No Urinary tract infection?: No Sexually transmitted disease?: No Injury to kidneys or bladder?: No Painful intercourse?: No Weak stream?: No Currently pregnant?: No Vaginal bleeding?: No Last menstrual period?: n  Gastrointestinal Nausea?: No Vomiting?: No Indigestion/heartburn?: No Diarrhea?: No Constipation?: No  Constitutional Fever: No Night sweats?: No Weight loss?: No Fatigue?: No  Skin Skin rash/lesions?: No Itching?: No  Eyes Blurred vision?: No Double vision?: No  Ears/Nose/Throat Sore throat?: No Sinus problems?: No  Hematologic/Lymphatic Swollen glands?: No Easy  bruising?: No  Cardiovascular Leg swelling?: No Chest pain?: No  Respiratory Cough?: No Shortness of breath?: No  Endocrine Excessive thirst?: No  Musculoskeletal Back pain?: No Joint pain?: Yes  Neurological Headaches?: No Dizziness?: No  Psychologic Depression?: No Anxiety?: No  Physical Exam: BP (!) 167/88   Pulse 80   Wt 158 lb (71.7 kg)   BMI 27.12 kg/m   Constitutional:  Alert and oriented, No acute distress.  Accompanied today by husband. HEENT: [place] AT, moist mucus membranes.  Trachea midline, no masses. Cardiovascular: No clubbing, cyanosis, or edema. Respiratory: Normal respiratory effort, no increased work of breathing. Skin: No rashes, bruises or suspicious lesions. Neurologic: Grossly intact, no focal deficits, moving all 4 extremities. Psychiatric: Normal mood and affect.  Laboratory Data: Lab Results  Component Value Date   WBC 7.9 01/16/2018   HGB 15.0 01/16/2018   HCT 44.8 01/16/2018   MCV 88 01/16/2018   PLT 306 01/16/2018    Lab Results  Component Value Date   CREATININE 0.81 01/16/2018     Urinalysis UA reviewed today, see epic.  Appears suspicious again.  Pertinent Imaging: Results for orders placed during the  hospital encounter of 07/28/18  CT RENAL STONE STUDY   Addendum ADDENDUM REPORT: 07/28/2018 15:46  ADDENDUM: Additional item not shown in Impression section below:  Other Findings Warranting Further Action:  6 mm indeterminate right lower lobe pulmonary nodule. Non-contrast chest CT at 6-12 months is recommended. If the nodule is stable at time of repeat CT, then future CT at 18-24 months (from today's scan) is considered optional for low-risk patients, but is recommended for high-risk patients. This recommendation follows the consensus statement: Guidelines for Management of Incidental Pulmonary Nodules Detected on CT Images: From the Fleischner Society 2017; Radiology 2017; 284:228-243.   Electronically Signed   By: Earle Gell  M.D.   On: 07/28/2018 15:46       Earle Gell, MD 07/28/2018  3:49 PM         Narrative CLINICAL DATA:  Right flank pain. Nephrolithiasis. Recurrent bladder infections. Personal history of cervical carcinoma and radiation cystitis.  EXAM: CT ABDOMEN AND PELVIS WITHOUT CONTRAST  TECHNIQUE: Multidetector CT imaging of the abdomen and pelvis was performed following the standard protocol without IV contrast.  COMPARISON:  Ultrasound on 06/08/2016  FINDINGS: Lower Chest: 6 mm noncalcified pulmonary nodule seen in right lower lobe.  Hepatobiliary: No hepatic masses identified. Gallbladder is unremarkable.  Pancreas: No mass or inflammatory changes. Diffuse fatty replacement noted.  Spleen: Within normal limits in size and appearance.  Adrenals/Urinary Tract: Several renal calculi are seen in both kidneys. Several right renal parapelvic cysts are noted, but there is no convincing evidence of hydronephrosis. No evidence of ureteral calculi or dilatation.  Severe left hydronephrosis is seen with 2 calculi in the left renal pelvis near the UPJ, largest measuring 10 mm. No evidence of ureteral calculi or dilatation. Lower pelvic ureters and urinary bladder are not visualized due to severe artifact from bilateral hip prostheses.  Stomach/Bowel: No evidence of obstruction, inflammatory process or abnormal fluid collections.  Vascular/Lymphatic: No pathologically enlarged lymph nodes. No abdominal aortic aneurysm. Aortic atherosclerosis.  Reproductive: Not well visualized due to severe artifact from bilateral hip prostheses.  Other:  None.  Musculoskeletal: No suspicious bone lesions identified. Chronic appearing compression deformities of T12 and L1 vertebral bodies are noted.  IMPRESSION: Severe left hydronephrosis. 2 calculi in region of left UPJ, largest measuring 10 mm. The hydronephrosis could be due to these calculi or congenital UPJ obstruction. No evidence of  ureteral calculi or dilatation.  Several right renal sinus cysts, without definite evidence of hydronephrosis.  Bilateral nephrolithiasis.  Nonvisualization of bladder and other lower pelvic organs due to severe artifact from bilateral hip prostheses.  Electronically Signed: By: Earle Gell M.D. On: 07/28/2018 15:32     CT scan personally reviewed today and with the patient.  Assessment & Plan:    1. Recurrent UTI  Will pursue cysto/pelvic exam in OR See below Patient is agreeable this plan - Urinalysis, Complete -UCx  2. Hydronephrosis, unspecified hydronephrosis type History of hydronephrosis without appropriate follow-up dating back to 2017 CT scan with severe left hydronephrosis and cortical thinning, stones DDx includes left UPJ obstruction vs ureteral stricture vs obstructive stone vs ureteral stricture disease possibly from radiation   For further diagnostic evaluation as well as therapeutic intervention, I recommended pursuing pelvic exam, cystoscopy, bilateral retrograde pyelogram, left diagnostic ureteroscopy with possible laser lithotripsy and stent placement in the operating room.  We also discussed the role bladder biopsy should any bladder pathology be identified.    Risks and benefits of ureteroscopy were reviewed including but  not limited to infection, bleeding, pain, ureteral injury which could require open surgery versus prolonged indwelling if ureteral perforation occurs, persistent stone disease, requirement for staged procedure, possible stent, and global anesthesia risks. Patient expressed understanding and desires to proceed with ureteroscopy.  Preop UCx  3. History of kidney stones Multiple stones primarily on left measuring up to 1 cm  Recommend endoscopic management as above  4. Gross hematuria Discussed possible biopsy if pathology identified Will pursue cystoscopy as per above  5. Radiation cystitis See above  6. Pulmonary  Nodule Incidental as above Return in 6 months for Non contrast CT of chest   Pelvic exam and cystoscopy in OR as above  [facility] [address], [place], [place] [address] [phone]  I, Lucas Mallow, am acting as a scribe for Dr. Hollice Espy,  I have reviewed the above documentation for accuracy and completeness, and I agree with the above.   Hollice Espy, MD   I spent 40 min with this patient of which greater than 50% was spent in counseling and coordination of care with the patient.

## 2018-07-31 ENCOUNTER — Encounter
Admission: RE | Admit: 2018-07-31 | Discharge: 2018-07-31 | Disposition: A | Discharge status: Home / Self Care | Payer: Medicare Other | Source: Ambulatory Visit | Attending: Urology | Admitting: Urology

## 2018-07-31 ENCOUNTER — Other Ambulatory Visit: Payer: Self-pay

## 2018-07-31 HISTORY — DX: Other complications of anesthesia, initial encounter: T88.59XA

## 2018-07-31 HISTORY — DX: Adverse effect of unspecified anesthetic, initial encounter: T41.45XA

## 2018-07-31 HISTORY — DX: Nausea with vomiting, unspecified: R11.2

## 2018-07-31 HISTORY — DX: Other specified postprocedural states: Z98.890

## 2018-07-31 HISTORY — DX: Personal history of urinary calculi: Z87.442

## 2018-07-31 NOTE — Patient Instructions (Signed)
Your procedure is scheduled on: 08/04/18 Report to Day Surgery. To find out your arrival time please call [phone] between 1PM - 3PM on 08/01/18  Remember: Instructions that are not followed completely may result in serious medical risk,  up to and including death, or upon the discretion of your surgeon and anesthesiologist your  surgery may need to be rescheduled.     _X__ 1. Do not eat food after midnight the night before your procedure.                 No gum chewing or hard candies. You may drink clear liquids up to 2 hours                 before you are scheduled to arrive for your surgery- DO not drink clear                 liquids within 2 hours of the start of your surgery.                 Clear Liquids include:  water, apple juice without pulp, clear carbohydrate                 drink such as Clearfast of Gatorade, Black Coffee or Tea (Do not add                 anything to coffee or tea).  __X__2.  On the morning of surgery brush your teeth with toothpaste and water, you                may rinse your mouth with mouthwash if you wish.  Do not swallow any toothpaste of mouthwash.     _X__ 3.  No Alcohol for 24 hours before or after surgery.   _X__ 4.  Do Not Smoke or use e-cigarettes For 24 Hours Prior to Your Surgery.                 Do not use any chewable tobacco products for at least 6 hours prior to                 surgery.  ____  5.  Bring all medications with you on the day of surgery if instructed.   __X__  6.  Notify your doctor if there is any change in your medical condition      (cold, fever, infections).     Do not wear jewelry, make-up, hairpins, clips or nail polish. Do not wear lotions, powders, or perfumes. You may wear deodorant. Do not shave 48 hours prior to surgery. Men may shave face and neck. Do not bring valuables to the hospital.    [facility] is not responsible for any belongings or valuables.  Contacts, dentures or  bridgework may not be worn into surgery. Leave your suitcase in the car. After surgery it may be brought to your room. For patients admitted to the hospital, discharge time is determined by your treatment team.   Patients discharged the day of surgery will not be allowed to drive home.      _X___ Take these medicines the morning of surgery with A SIP OF WATER:    1.METOPROLOL  2.   3.   4.  5.  6.  ____ Fleet Enema (as directed)   ____ Use CHG Soap as directed  ____ Use inhalers on the day of surgery  ____ Stop metformin 2 days prior to surgery    ____  Take 1/2 of usual insulin dose the night before surgery. No insulin the morning          of surgery.   _X___ Stop Coumadin/Plavix/aspirin on  STOP 07/31/18 BC PAIN UNTIL AFTER SURGERY  __X__ Stop Anti-inflammatories on STOP DICLOFENAC 07/31/18 UNTIL AFTER SURGERY ____ Stop supplements until after surgery.    ____ Bring C-Pap to the hospital.

## 2018-08-01 ENCOUNTER — Other Ambulatory Visit: Payer: Self-pay | Admitting: Radiology

## 2018-08-01 ENCOUNTER — Encounter
Admission: RE | Admit: 2018-08-01 | Discharge: 2018-08-01 | Disposition: A | Discharge status: Home / Self Care | Payer: Medicare HMO | Source: Ambulatory Visit | Attending: Urology | Admitting: Urology

## 2018-08-01 ENCOUNTER — Telehealth: Payer: Self-pay | Admitting: Urology

## 2018-08-01 ENCOUNTER — Encounter: Payer: Self-pay | Admitting: Family Medicine

## 2018-08-01 ENCOUNTER — Other Ambulatory Visit: Payer: Self-pay | Admitting: Family Medicine

## 2018-08-01 DIAGNOSIS — Z87442 Personal history of urinary calculi: Secondary | ICD-10-CM | POA: Diagnosis not present

## 2018-08-01 DIAGNOSIS — N3592 Unspecified urethral stricture, female: Secondary | ICD-10-CM | POA: Diagnosis not present

## 2018-08-01 DIAGNOSIS — N390 Urinary tract infection, site not specified: Secondary | ICD-10-CM

## 2018-08-01 DIAGNOSIS — Y842 Radiological procedure and radiotherapy as the cause of abnormal reaction of the patient, or of later complication, without mention of misadventure at the time of the procedure: Secondary | ICD-10-CM | POA: Diagnosis not present

## 2018-08-01 DIAGNOSIS — N200 Calculus of kidney: Secondary | ICD-10-CM | POA: Diagnosis present

## 2018-08-01 DIAGNOSIS — R918 Other nonspecific abnormal finding of lung field: Secondary | ICD-10-CM | POA: Diagnosis not present

## 2018-08-01 DIAGNOSIS — R310 Gross hematuria: Secondary | ICD-10-CM | POA: Diagnosis not present

## 2018-08-01 DIAGNOSIS — N1330 Unspecified hydronephrosis: Secondary | ICD-10-CM

## 2018-08-01 DIAGNOSIS — N132 Hydronephrosis with renal and ureteral calculous obstruction: Secondary | ICD-10-CM | POA: Diagnosis not present

## 2018-08-01 DIAGNOSIS — N3040 Irradiation cystitis without hematuria: Secondary | ICD-10-CM | POA: Diagnosis not present

## 2018-08-01 DIAGNOSIS — Z882 Allergy status to sulfonamides status: Secondary | ICD-10-CM | POA: Diagnosis not present

## 2018-08-01 DIAGNOSIS — I10 Essential (primary) hypertension: Secondary | ICD-10-CM | POA: Diagnosis not present

## 2018-08-01 DIAGNOSIS — N895 Stricture and atresia of vagina: Secondary | ICD-10-CM | POA: Diagnosis not present

## 2018-08-01 DIAGNOSIS — R9431 Abnormal electrocardiogram [ECG] [EKG]: Secondary | ICD-10-CM | POA: Insufficient documentation

## 2018-08-01 DIAGNOSIS — R197 Diarrhea, unspecified: Secondary | ICD-10-CM | POA: Diagnosis not present

## 2018-08-01 DIAGNOSIS — Z8541 Personal history of malignant neoplasm of cervix uteri: Secondary | ICD-10-CM | POA: Diagnosis not present

## 2018-08-01 DIAGNOSIS — Z90710 Acquired absence of both cervix and uterus: Secondary | ICD-10-CM | POA: Diagnosis not present

## 2018-08-01 DIAGNOSIS — Z87440 Personal history of urinary (tract) infections: Secondary | ICD-10-CM | POA: Diagnosis not present

## 2018-08-01 DIAGNOSIS — Z923 Personal history of irradiation: Secondary | ICD-10-CM | POA: Diagnosis not present

## 2018-08-01 DIAGNOSIS — Z01810 Encounter for preprocedural cardiovascular examination: Secondary | ICD-10-CM | POA: Insufficient documentation

## 2018-08-01 DIAGNOSIS — Z87891 Personal history of nicotine dependence: Secondary | ICD-10-CM | POA: Diagnosis not present

## 2018-08-01 DIAGNOSIS — Z96643 Presence of artificial hip joint, bilateral: Secondary | ICD-10-CM | POA: Diagnosis not present

## 2018-08-01 DIAGNOSIS — Z79899 Other long term (current) drug therapy: Secondary | ICD-10-CM | POA: Diagnosis not present

## 2018-08-01 DIAGNOSIS — M069 Rheumatoid arthritis, unspecified: Secondary | ICD-10-CM | POA: Diagnosis not present

## 2018-08-01 DIAGNOSIS — M0579 Rheumatoid arthritis with rheumatoid factor of multiple sites without organ or systems involvement: Secondary | ICD-10-CM

## 2018-08-01 DIAGNOSIS — R319 Hematuria, unspecified: Secondary | ICD-10-CM

## 2018-08-01 DIAGNOSIS — Z96651 Presence of right artificial knee joint: Secondary | ICD-10-CM | POA: Diagnosis not present

## 2018-08-01 MED ORDER — CIPROFLOXACIN HCL 500 MG PO TABS: 500.0000 mg | ORAL_TABLET | Freq: Two times a day (BID) | ORAL | 0 refills | Status: DC

## 2018-08-01 MED ORDER — HYDROCODONE-ACETAMINOPHEN 7.5-325 MG PO TABS: 1.0000 | ORAL_TABLET | Freq: Four times a day (QID) | ORAL | 0 refills | Status: DC | PRN

## 2018-08-01 NOTE — Telephone Encounter (Signed)
Informed patient of positive urine culture & script sent to pharmacy. Dosing instructions were discussed & questions answered. Patient expresses understanding of conversation.

## 2018-08-01 NOTE — Telephone Encounter (Signed)
Please go ahead and treat this patient with Cipro 500 mg twice daily in light of her upcoming surgery and her urine growing E. coli.  Lets go ahead and give her a 7-day supply.  Hollice Espy, MD

## 2018-08-02 ENCOUNTER — Encounter: Payer: Self-pay | Admitting: Anesthesiology

## 2018-08-03 LAB — CULTURE, URINE COMPREHENSIVE

## 2018-08-03 MED ORDER — SODIUM CHLORIDE 0.9 % IV SOLN
1.0000 g | INTRAVENOUS | Status: AC
  Administered 2018-08-04: 1 g via INTRAVENOUS
  Filled 2018-08-03: qty 1

## 2018-08-03 MED ORDER — GENTAMICIN SULFATE 40 MG/ML IJ SOLN
5.0000 mg/kg | INTRAVENOUS | Status: AC
  Administered 2018-08-04: 310 mg via INTRAVENOUS
  Filled 2018-08-03: qty 7.75

## 2018-08-03 NOTE — Progress Notes (Signed)
Pharmacy Antibiotic Note  Jeanne [name] is a 67 y.o. female admitted on (Not on file) with surgical prophylaxis.  Pharmacy has been consulted for gentamicin dosing.  Plan: Will give patient gentamicin 310 mg IV x 1 for surgical prophylaxis  Dose of 5 mg/kg per adjust body weight (61.2 kg)     No data recorded.  No results for input(s): WBC, CREATININE, LATICACIDVEN, VANCOTROUGH, VANCOPEAK, VANCORANDOM, GENTTROUGH, GENTPEAK, GENTRANDOM, TOBRATROUGH, TOBRAPEAK, TOBRARND, AMIKACINPEAK, AMIKACINTROU, AMIKACIN in the last 168 hours.  CrCl cannot be calculated (Patient's most recent lab result is older than the maximum 21 days allowed.).    Allergies  Allergen Reactions  . Codeine Nausea Only and Nausea And Vomiting  . Sulfa Antibiotics Rash    Mouth ulcers    Thank you for allowing pharmacy to be a part of this patient's care.  Tobie Lords, PharmD, BCPS Clinical Pharmacist 08/03/2018

## 2018-08-04 ENCOUNTER — Ambulatory Visit
Admission: RE | Admit: 2018-08-04 | Discharge: 2018-08-04 | Disposition: A | Discharge status: Home / Self Care | Payer: Medicare HMO | Source: Ambulatory Visit | Attending: Urology | Admitting: Urology

## 2018-08-04 ENCOUNTER — Ambulatory Visit: Payer: Medicare HMO | Admitting: Anesthesiology

## 2018-08-04 ENCOUNTER — Encounter: Payer: Self-pay | Admitting: *Deleted

## 2018-08-04 ENCOUNTER — Encounter
Admission: RE | Disposition: A | Discharge status: Home / Self Care | Payer: Self-pay | Source: Ambulatory Visit | Attending: Urology

## 2018-08-04 ENCOUNTER — Encounter: Payer: Self-pay | Admitting: Urology

## 2018-08-04 DIAGNOSIS — Y842 Radiological procedure and radiotherapy as the cause of abnormal reaction of the patient, or of later complication, without mention of misadventure at the time of the procedure: Secondary | ICD-10-CM | POA: Insufficient documentation

## 2018-08-04 DIAGNOSIS — R310 Gross hematuria: Secondary | ICD-10-CM | POA: Insufficient documentation

## 2018-08-04 DIAGNOSIS — N200 Calculus of kidney: Secondary | ICD-10-CM

## 2018-08-04 DIAGNOSIS — Z87891 Personal history of nicotine dependence: Secondary | ICD-10-CM | POA: Insufficient documentation

## 2018-08-04 DIAGNOSIS — N3040 Irradiation cystitis without hematuria: Secondary | ICD-10-CM | POA: Diagnosis not present

## 2018-08-04 DIAGNOSIS — Z8541 Personal history of malignant neoplasm of cervix uteri: Secondary | ICD-10-CM | POA: Diagnosis not present

## 2018-08-04 DIAGNOSIS — I10 Essential (primary) hypertension: Secondary | ICD-10-CM | POA: Insufficient documentation

## 2018-08-04 DIAGNOSIS — N1330 Unspecified hydronephrosis: Secondary | ICD-10-CM | POA: Diagnosis not present

## 2018-08-04 DIAGNOSIS — Z87440 Personal history of urinary (tract) infections: Secondary | ICD-10-CM | POA: Insufficient documentation

## 2018-08-04 DIAGNOSIS — Z882 Allergy status to sulfonamides status: Secondary | ICD-10-CM | POA: Insufficient documentation

## 2018-08-04 DIAGNOSIS — M069 Rheumatoid arthritis, unspecified: Secondary | ICD-10-CM | POA: Diagnosis not present

## 2018-08-04 DIAGNOSIS — M0579 Rheumatoid arthritis with rheumatoid factor of multiple sites without organ or systems involvement: Secondary | ICD-10-CM

## 2018-08-04 DIAGNOSIS — N895 Stricture and atresia of vagina: Secondary | ICD-10-CM | POA: Diagnosis not present

## 2018-08-04 DIAGNOSIS — Z79899 Other long term (current) drug therapy: Secondary | ICD-10-CM | POA: Insufficient documentation

## 2018-08-04 DIAGNOSIS — N3592 Unspecified urethral stricture, female: Secondary | ICD-10-CM | POA: Diagnosis not present

## 2018-08-04 DIAGNOSIS — Z96651 Presence of right artificial knee joint: Secondary | ICD-10-CM | POA: Insufficient documentation

## 2018-08-04 DIAGNOSIS — Z923 Personal history of irradiation: Secondary | ICD-10-CM | POA: Insufficient documentation

## 2018-08-04 DIAGNOSIS — N132 Hydronephrosis with renal and ureteral calculous obstruction: Secondary | ICD-10-CM | POA: Insufficient documentation

## 2018-08-04 DIAGNOSIS — R197 Diarrhea, unspecified: Secondary | ICD-10-CM | POA: Insufficient documentation

## 2018-08-04 DIAGNOSIS — Z87442 Personal history of urinary calculi: Secondary | ICD-10-CM | POA: Insufficient documentation

## 2018-08-04 DIAGNOSIS — Z96643 Presence of artificial hip joint, bilateral: Secondary | ICD-10-CM | POA: Insufficient documentation

## 2018-08-04 DIAGNOSIS — Z90710 Acquired absence of both cervix and uterus: Secondary | ICD-10-CM | POA: Insufficient documentation

## 2018-08-04 DIAGNOSIS — R918 Other nonspecific abnormal finding of lung field: Secondary | ICD-10-CM | POA: Insufficient documentation

## 2018-08-04 HISTORY — PX: CYSTOGRAM: SHX6285

## 2018-08-04 HISTORY — PX: CYSTOSCOPY/URETEROSCOPY/HOLMIUM LASER/STENT PLACEMENT: SHX6546

## 2018-08-04 HISTORY — PX: CYSTOSCOPY W/ RETROGRADES: SHX1426

## 2018-08-04 SURGERY — CYSTOSCOPY/URETEROSCOPY/HOLMIUM LASER/STENT PLACEMENT
Anesthesia: General | Site: Bladder

## 2018-08-04 MED ORDER — FENTANYL CITRATE (PF) 100 MCG/2ML IJ SOLN
INTRAMUSCULAR | Status: DC | PRN
  Administered 2018-08-04 (×4): 25 ug via INTRAVENOUS

## 2018-08-04 MED ORDER — GLYCOPYRROLATE 0.2 MG/ML IJ SOLN
INTRAMUSCULAR | Status: DC | PRN
  Administered 2018-08-04: 0.2 mg via INTRAVENOUS

## 2018-08-04 MED ORDER — PROPOFOL 500 MG/50ML IV EMUL
INTRAVENOUS | Status: DC | PRN
  Administered 2018-08-04: 20 ug/kg/min via INTRAVENOUS

## 2018-08-04 MED ORDER — PROPOFOL 10 MG/ML IV BOLUS
INTRAVENOUS | Status: AC
  Filled 2018-08-04: qty 20

## 2018-08-04 MED ORDER — LACTATED RINGERS IV SOLN
INTRAVENOUS | Status: DC
  Administered 2018-08-04: 75 mL/h via INTRAVENOUS
  Administered 2018-08-04: 13:00:00 via INTRAVENOUS

## 2018-08-04 MED ORDER — SUCCINYLCHOLINE CHLORIDE 20 MG/ML IJ SOLN
INTRAMUSCULAR | Status: DC | PRN
  Administered 2018-08-04: 50 mg via INTRAVENOUS
  Administered 2018-08-04: 80 mg via INTRAVENOUS

## 2018-08-04 MED ORDER — FAMOTIDINE 20 MG PO TABS
ORAL_TABLET | ORAL | Status: AC
  Filled 2018-08-04: qty 1

## 2018-08-04 MED ORDER — ONDANSETRON HCL 4 MG PO TABS: 4.0000 mg | ORAL_TABLET | Freq: Every day | ORAL | 1 refills | Status: DC | PRN

## 2018-08-04 MED ORDER — ONDANSETRON HCL 4 MG/2ML IJ SOLN
INTRAMUSCULAR | Status: AC
  Filled 2018-08-04: qty 2

## 2018-08-04 MED ORDER — ROCURONIUM BROMIDE 50 MG/5ML IV SOLN
INTRAVENOUS | Status: AC
  Filled 2018-08-04: qty 1

## 2018-08-04 MED ORDER — LIDOCAINE HCL (PF) 2 % IJ SOLN
INTRAMUSCULAR | Status: AC
  Filled 2018-08-04: qty 10

## 2018-08-04 MED ORDER — DOCUSATE SODIUM 100 MG PO CAPS: 100.0000 mg | ORAL_CAPSULE | Freq: Two times a day (BID) | ORAL | 0 refills | Status: DC

## 2018-08-04 MED ORDER — ONDANSETRON HCL 4 MG/2ML IJ SOLN
INTRAMUSCULAR | Status: DC | PRN
  Administered 2018-08-04: 4 mg via INTRAVENOUS

## 2018-08-04 MED ORDER — ONDANSETRON HCL 4 MG/2ML IJ SOLN: 4.0000 mg | Freq: Once | INTRAMUSCULAR | Status: DC | PRN

## 2018-08-04 MED ORDER — OXYBUTYNIN CHLORIDE 5 MG PO TABS: 5.0000 mg | ORAL_TABLET | Freq: Three times a day (TID) | ORAL | 0 refills | Status: DC | PRN

## 2018-08-04 MED ORDER — MIDAZOLAM HCL 2 MG/2ML IJ SOLN
INTRAMUSCULAR | Status: AC
  Filled 2018-08-04: qty 2

## 2018-08-04 MED ORDER — SUCCINYLCHOLINE CHLORIDE 20 MG/ML IJ SOLN
INTRAMUSCULAR | Status: AC
  Filled 2018-08-04: qty 1

## 2018-08-04 MED ORDER — PHENYLEPHRINE HCL 10 MG/ML IJ SOLN
INTRAMUSCULAR | Status: DC | PRN
  Administered 2018-08-04 (×2): 100 ug via INTRAVENOUS
  Administered 2018-08-04: 50 ug via INTRAVENOUS
  Administered 2018-08-04 (×2): 100 ug via INTRAVENOUS
  Administered 2018-08-04: 50 ug via INTRAVENOUS
  Administered 2018-08-04 (×7): 100 ug via INTRAVENOUS

## 2018-08-04 MED ORDER — TAMSULOSIN HCL 0.4 MG PO CAPS: 0.4000 mg | ORAL_CAPSULE | Freq: Every day | ORAL | 0 refills | Status: DC

## 2018-08-04 MED ORDER — FAMOTIDINE 20 MG PO TABS
20.0000 mg | ORAL_TABLET | Freq: Once | ORAL | Status: AC
  Administered 2018-08-04: 20 mg via ORAL

## 2018-08-04 MED ORDER — FENTANYL CITRATE (PF) 100 MCG/2ML IJ SOLN
INTRAMUSCULAR | Status: AC
  Filled 2018-08-04: qty 2

## 2018-08-04 MED ORDER — MIDAZOLAM HCL 2 MG/2ML IJ SOLN
INTRAMUSCULAR | Status: DC | PRN
  Administered 2018-08-04: 1 mg via INTRAVENOUS

## 2018-08-04 MED ORDER — LIDOCAINE HCL (CARDIAC) PF 100 MG/5ML IV SOSY
PREFILLED_SYRINGE | INTRAVENOUS | Status: DC | PRN
  Administered 2018-08-04: 100 mg via INTRAVENOUS

## 2018-08-04 MED ORDER — HYDROCODONE-ACETAMINOPHEN 7.5-325 MG PO TABS: 1.0000 | ORAL_TABLET | Freq: Four times a day (QID) | ORAL | 0 refills | Status: DC | PRN

## 2018-08-04 MED ORDER — PROPOFOL 10 MG/ML IV BOLUS
INTRAVENOUS | Status: DC | PRN
  Administered 2018-08-04: 100 mg via INTRAVENOUS
  Administered 2018-08-04: 40 mg via INTRAVENOUS

## 2018-08-04 MED ORDER — DEXAMETHASONE SODIUM PHOSPHATE 10 MG/ML IJ SOLN
INTRAMUSCULAR | Status: AC
  Filled 2018-08-04: qty 1

## 2018-08-04 MED ORDER — GLYCOPYRROLATE 0.2 MG/ML IJ SOLN
INTRAMUSCULAR | Status: AC
  Filled 2018-08-04: qty 1

## 2018-08-04 MED ORDER — IOPAMIDOL (ISOVUE-200) INJECTION 41%
INTRAVENOUS | Status: DC | PRN
  Administered 2018-08-04: 40 mL via INTRAVENOUS

## 2018-08-04 MED ORDER — DEXAMETHASONE SODIUM PHOSPHATE 10 MG/ML IJ SOLN
INTRAMUSCULAR | Status: DC | PRN
  Administered 2018-08-04: 10 mg via INTRAVENOUS

## 2018-08-04 MED ORDER — FENTANYL CITRATE (PF) 100 MCG/2ML IJ SOLN
25.0000 ug | INTRAMUSCULAR | Status: DC | PRN
  Administered 2018-08-04 (×4): 25 ug via INTRAVENOUS

## 2018-08-04 SURGICAL SUPPLY — 42 items
ADH LQ OCL WTPRF AMP STRL LF (MISCELLANEOUS) ×4
ADHESIVE MASTISOL STRL (MISCELLANEOUS) ×2 IMPLANT
BAG DRAIN CYSTO-URO LG1000N (MISCELLANEOUS) ×5 IMPLANT
BASKET ZERO TIP 1.9FR (BASKET) ×4 IMPLANT
BRUSH SCRUB EZ  4% CHG (MISCELLANEOUS) ×1
BRUSH SCRUB EZ 1% IODOPHOR (MISCELLANEOUS) ×5 IMPLANT
BRUSH SCRUB EZ 4% CHG (MISCELLANEOUS) ×4 IMPLANT
BSKT STON RTRVL ZERO TP 1.9FR (BASKET) ×8
CATH COUDE FOLEY 2W 5CC 16FR (CATHETERS) ×2 IMPLANT
CATH URETL 5X70 OPEN END (CATHETERS) ×5 IMPLANT
CNTNR SPEC 2.5X3XGRAD LEK (MISCELLANEOUS) ×4
CONT SPEC 4OZ STER OR WHT (MISCELLANEOUS) ×1
CONT SPEC 4OZ STRL OR WHT (MISCELLANEOUS) ×4
CONTAINER SPEC 2.5X3XGRAD LEK (MISCELLANEOUS) ×1 IMPLANT
COVER WAND RF STERILE (DRAPES) ×5 IMPLANT
DRAPE UTILITY 15X26 TOWEL STRL (DRAPES) ×5 IMPLANT
DRSG TEGADERM 2-3/8X2-3/4 SM (GAUZE/BANDAGES/DRESSINGS) ×2 IMPLANT
DRSG TELFA 4X3 1S NADH ST (GAUZE/BANDAGES/DRESSINGS) ×5 IMPLANT
ELECT REM PT RETURN 9FT ADLT (ELECTROSURGICAL) ×5
ELECTRODE REM PT RTRN 9FT ADLT (ELECTROSURGICAL) ×4 IMPLANT
FIBER LASER LITHO 273 (Laser) ×2 IMPLANT
GLOVE BIO SURGEON STRL SZ 6.5 (GLOVE) ×5 IMPLANT
GOWN STRL REUS W/ TWL LRG LVL3 (GOWN DISPOSABLE) ×8 IMPLANT
GOWN STRL REUS W/TWL LRG LVL3 (GOWN DISPOSABLE) ×10
GUIDEWIRE GREEN .038 145CM (MISCELLANEOUS) ×2 IMPLANT
INFUSOR MANOMETER BAG 3000ML (MISCELLANEOUS) ×5 IMPLANT
INTRODUCER DILATOR DOUBLE (INTRODUCER) ×2 IMPLANT
KIT TURNOVER CYSTO (KITS) ×5 IMPLANT
NDL SAFETY ECLIPSE 18X1.5 (NEEDLE) ×4 IMPLANT
NEEDLE HYPO 18GX1.5 SHARP (NEEDLE) ×5
PACK CYSTO AR (MISCELLANEOUS) ×5 IMPLANT
SENSORWIRE 0.038 NOT ANGLED (WIRE) ×5
SET CYSTO W/LG BORE CLAMP LF (SET/KITS/TRAYS/PACK) ×5 IMPLANT
SHEATH URETERAL 12FRX35CM (MISCELLANEOUS) ×2 IMPLANT
SOL .9 NS 3000ML IRR  AL (IV SOLUTION) ×2
SOL .9 NS 3000ML IRR AL (IV SOLUTION) ×8
SOL .9 NS 3000ML IRR UROMATIC (IV SOLUTION) ×5 IMPLANT
STENT URET 6FRX24 CONTOUR (STENTS) ×2 IMPLANT
SURGILUBE 2OZ TUBE FLIPTOP (MISCELLANEOUS) ×5 IMPLANT
WATER STERILE IRR 1000ML POUR (IV SOLUTION) ×5 IMPLANT
WATER STERILE IRR 3000ML UROMA (IV SOLUTION) ×7 IMPLANT
WIRE SENSOR 0.038 NOT ANGLED (WIRE) ×4 IMPLANT

## 2018-08-04 NOTE — Op Note (Signed)
Date of procedure: 08/04/18  Preoperative diagnosis:  1. Kidney stones 2. Gross hematuria 3. History of radiation cystitis 4. Vaginal stenosis  5. Recurrent UTIs  Postoperative diagnosis:  1. Same as above 2. Urethral meatal stenosis  Procedure: 1. Vaginoscopy 2. Pelvic exam under anesthesia 3. Cystogram 4. Urethral dilation 5. Bilateral retrograde pyelogram 6. Left ureteroscopy with laser lithotripsy 7. Left ureteral stent placement  Surgeon: Hollice Espy, MD  Anesthesia: General  Complications: None  Intraoperative findings: Difficult airway per anesthesia.  Severe vaginal stenosis with urethral stenosis.  Cystoscopic findings and bladder consistent with radiation cystitis.  Normal right retrograde pyelogram.  Left retrograde pyelogram with no overt hydronephrosis, renal pelvic fullness with elongated stenotic infundibula without caliectasis.  Multiple large stones treated within the renal pelvis and lower pole.  EBL: Minimal  Specimens: Stone fragment  Drains: 6 x 24 French double-J ureteral stent on left, tether left in place  Indication: Jeanne [name] is a 67 y.o. patient with multiple issues including recurrent urinary tract infections, gross hematuria, left hydronephrosis with large stones.  After reviewing the management options for treatment, she elected to proceed with the above surgical procedure(s). We have discussed the potential benefits and risks of the procedure, side effects of the proposed treatment, the likelihood of the patient achieving the goals of the procedure, and any potential problems that might occur during the procedure or recuperation. Informed consent has been obtained.  Description of procedure:  The patient was taken to the operating room and general anesthesia was induced.  She was extremely difficult airway after several attempts at endotracheal intubation, ultimately an LMA was placed.  The patient was placed in the dorsal lithotomy  position (patient had very limited hip mobility thus dorsolithotomy position was modified), prepped and draped in the usual sterile fashion, and preoperative antibiotics were administered. A preoperative time-out was performed.   Time, a pelvic exam under anesthesia was performed which revealed extreme vaginal stenosis, able to accommodate pinky finger only.  I was unable to visualize the urethral meatus or place a speculum.  This did not appear to be secondary to adhesions, rather true vaginal stenosis.  At this point time, a 9 [place] scope was introduced into the vaginal opening and vaginoscopy was performed.  The vaginal mucosa appeared friable and was blind ending.  A small opening was seen anterior superiorly which had a stovepipe appearance, approximately 5 [place] in diameter and appeared to be in the location of the urethra although at this point I was not particular shortness the anatomy was fairly distorted.  I inserted a 5 [place] open-ended ureteral catheter within the opening and injected contrast to perform a cystogram which did indeed confirm that this was the urethra.  The contour of the bladder itself was normal.  A wire was then placed to the 5 [place] open-ended ureteral catheter and coiled within the bladder.  I then introduced the scope over the wire into the bladder bluntly, strictures were not not particularly dense and the scope ultimately passed easily.  The bladder was then carefully inspected and noted to be consistent with radiation cystitis changes, the wall of the bladder had some irregular vasculature diffusely throughout but no particular areas of erythema or tumors within the bladder.  There is no particular concern for CIS of the bladder.  Attention was then turned to the right ureteral orifice which had a somewhat stenotic appearance.  I was able to cannulate the well using a wire and ultimately a 5 [place] open-ended  ureteral catheter just within the UO.  I did a retrograde  pyelogram on the side revealed a normal caliber ureter without hydroureteronephrosis.  Attention was then turned to the left ureteral orifice which was more technically challenging to intubate due to the patient's inability to be placed in a normal dorsolithotomy position.  Ultimately, I was successful intubating the UO with a 5 [place] open-ended ureteral catheter gentle retrograde pyelogram was performed on this side.  The ureter itself was decompressed without concern for stricture disease and tapered nicely into her renal pelvis which was mildly dilated but not overtly hydronephrotic.  There are very long infundibula on the side in comparison to the left a few of which were stenotic without particular caliectasis.  There is no real concern for high-grade obstruction on retrograde pyelogram.  A filling defect could be seen in the renal pelvis consistent with the largest of the known stones as well as some filling defects in the extreme lower pole where additional stones were located.  Next, using a dual-lumen introducer, I was able to advance a Super Stiff wire up to level of the kidney.  I then fairly easily advanced a Cook access sheath, 4680 French up to level the proximal ureter under fluoroscopic guidance.  The inner cannula and suture wire was then removed.  An 8 French dual-lumen digital flexible ureteroscope was then advanced through the access sheath into the renal pelvis with a first large stone was encountered.  A 273 m laser fiber was then brought in and using the settings of initially 0.2 J and 40 Hz and later 1.5 J and 15 Hz, the stone was fragmented into innumerable smaller pieces.  It then also treated in extreme lower pole stone times multiple.  This created a large amount of debris.  A 1.9 French tipless nitinol basket was then used to clear out the collecting system of all significant stone burden.  Some of the fragments were slightly too large which were further fragmented and ultimately the  entire collecting system was evacuated of all significant stone burden debris.  The residual stone burden was dusted into fine particles.  A final retrograde pyelogram was performed and each every calyx was directly visualized such that no significant residual stone burden remained.  The scope was then backed down the length the ureter inspecting along the way.  There are no significant residual stone fragments appreciated no ureteral injuries appreciated other than some mucosal irritation from the access sheath.  Finally, the scope was completely removed.  A 6 x 24 French double-J ureteral stent was then advanced up to level of the kidney.  The wires partially drawn until focal stone within the upper pole calyx.  The wire was then fully withdrawn a full coils noted within the bladder.  There was still residual contrast material within the bladder which was distended.  As such, a 76 [place] coud was advanced into the bladder relatively blindly and the bladder was drained of light pink urine until was decompressed.  The patient was then cleaned and dried and the stent string was affixed to the patient's inner thigh using Mastisol and Tegaderm.  She was then repositioned the supine position, reversed from anesthesia, and taken to the PACU in stable condition.  Plan: findings were discussed both with the patient and her husband.  She will remove her own stent in 1 week.  We will plan for renal ultrasound in 4 weeks.  I suspect her hydronephrosis may in fact be either due  to the presence of parapelvic cysts versus intermittent obstruction.  We will follow-up with a renal ultrasound to further assess.    Also discussed given her very difficult airway and anatomy, we will be very judicious in the future about returning to the operating room.  Hollice Espy, M.D.

## 2018-08-04 NOTE — Transfer of Care (Signed)
Immediate Anesthesia Transfer of Care Note  Patient: Jeanne [name]  Procedure(s) Performed: CYSTOSCOPY/URETEROSCOPY/HOLMIUM LASER/STENT PLACEMENT (Left ) CYSTOSCOPY WITH RETROGRADE PYELOGRAM (Bilateral ) CYSTOGRAM WITH URETHAL DILATION (N/A Bladder)  Patient Location: PACU  Anesthesia Type:General  Level of Consciousness: awake and patient cooperative  Airway & Oxygen Therapy: Patient Spontanous Breathing and Patient connected to face mask oxygen  Post-op Assessment: Report given to RN and Post -op Vital signs reviewed and stable  Post vital signs: stable  Last Vitals:  Vitals Value Taken Time  BP 146/85 08/04/2018  2:19 PM  Temp    Pulse 79 08/04/2018  2:20 PM  Resp 18 08/04/2018  2:20 PM  SpO2 99 % 08/04/2018  2:20 PM  Vitals shown include unvalidated device data.  Last Pain:  Vitals:   08/04/18 1044  TempSrc: Temporal  PainSc: 5          Complications: No apparent anesthesia complications

## 2018-08-04 NOTE — Anesthesia Postprocedure Evaluation (Signed)
Anesthesia Post Note  Patient: GWENDALYNN ECKSTROM  Procedure(s) Performed: CYSTOSCOPY/URETEROSCOPY/HOLMIUM LASER/STENT PLACEMENT (Left ) CYSTOSCOPY WITH RETROGRADE PYELOGRAM (Bilateral ) CYSTOGRAM WITH URETHAL DILATION (N/A Bladder)  Patient location during evaluation: PACU Anesthesia Type: General Level of consciousness: awake and alert Pain management: pain level controlled Vital Signs Assessment: post-procedure vital signs reviewed and stable Respiratory status: spontaneous breathing, nonlabored ventilation, respiratory function stable and patient connected to nasal cannula oxygen Cardiovascular status: blood pressure returned to baseline and stable Postop Assessment: no apparent nausea or vomiting Anesthetic complications: no     Last Vitals:  Vitals:   08/04/18 1520 08/04/18 1550  BP: (!) 144/70 134/78  Pulse: 70 72  Resp: 16 16  Temp: 36.4 C   SpO2: 99% 98%    Last Pain:  Vitals:   08/04/18 1550  TempSrc:   PainSc: 0-No pain                 Saleema Weppler S

## 2018-08-04 NOTE — Anesthesia Procedure Notes (Addendum)
Procedure Name: LMA Insertion Date/Time: 08/04/2018 12:05 PM Performed by: Lavone Orn, CRNA Pre-anesthesia Checklist: Patient identified, Emergency Drugs available, Suction available, Patient being monitored and Timeout performed Patient Re-evaluated:Patient Re-evaluated prior to induction Oxygen Delivery Method: Circle system utilized Preoxygenation: Pre-oxygenation with 100% oxygen Induction Type: IV induction Ventilation: Oral airway inserted - appropriate to patient size LMA: LMA inserted LMA Size: 3.0 Tube secured with: Tape Difficulty Due To: Difficulty was anticipated, Difficult Airway- due to reduced neck mobility and Difficult Airway- due to limited oral opening Comments: LMA utilized after failed ETT intubation with Meredith Staggers

## 2018-08-04 NOTE — Discharge Instructions (Addendum)
You have a ureteral stent in place.  This is a tube that extends from your kidney to your bladder.  This may cause urinary bleeding, burning with urination, and urinary frequency.  Please call our office or present to the ED if you develop fevers >101 or pain which is not able to be controlled with oral pain medications.  You may be given either Flomax and/ or ditropan to help with bladder spasms and stent pain in addition to pain medications.    Your stent is on a string.  On Monday in 1 week, you may untape and pull the string until the entire stent is removed.  In the interim, please try your best not to accidentally dislodged the stent or pole prematurely.  [facility] [address] [place], [place] [address] [phone]   AMBULATORY SURGERY  DISCHARGE INSTRUCTIONS   1) The drugs that you were given will stay in your system until tomorrow so for the next 24 hours you should not:  A) Drive an automobile B) Make any legal decisions C) Drink any alcoholic beverage   2) You may resume regular meals tomorrow.  Today it is better to start with liquids and gradually work up to solid foods.  You may eat anything you prefer, but it is better to start with liquids, then soup and crackers, and gradually work up to solid foods.   3) Please notify your doctor immediately if you have any unusual bleeding, trouble breathing, redness and pain at the surgery site, drainage, fever, or pain not relieved by medication.    4) Additional Instructions:        Please contact your physician with any problems or Same Day Surgery at [phone], Monday through Friday 6 am to 4 pm, or [place] at [facility] number at [phone].

## 2018-08-04 NOTE — Interval H&P Note (Signed)
History and Physical Interval Note:  08/04/2018 11:31 AM  Jeanne [name]  has presented today for surgery, with the diagnosis of Left hydronephrosis  The various methods of treatment have been discussed with the patient and family. After consideration of risks, benefits and other options for treatment, the patient has consented to  Procedure(s) with comments: CYSTOSCOPY WITH URETEROSCOPY (Left) - plus pelvic exam under anesthesia CYSTOSCOPY/URETEROSCOPY/HOLMIUM LASER/STENT PLACEMENT (Left) CYSTOSCOPY WITH Bladder BIOPSY (N/A) CYSTOSCOPY WITH RETROGRADE PYELOGRAM (Bilateral) as a surgical intervention .  The patient's history has been reviewed, patient examined, no change in status, stable for surgery.  I have reviewed the patient's chart and labs.  Questions were answered to the patient's satisfaction.    RRR CTAB  PReop UCx positive, currently on Cipro started 3 days ago.  Hollice Espy

## 2018-08-04 NOTE — Anesthesia Preprocedure Evaluation (Addendum)
Anesthesia Evaluation  Patient identified by MRN, date of birth, ID band Patient awake    Reviewed: Allergy & Precautions, NPO status , Patient's Chart, lab work & pertinent test results, reviewed documented beta blocker date and time   History of Anesthesia Complications (+) PONV and history of anesthetic complications  Airway Mallampati: II  TM Distance: >3 FB     Dental  (+) Chipped   Pulmonary former smoker,           Cardiovascular hypertension, Pt. on medications and Pt. on home beta blockers      Neuro/Psych    GI/Hepatic   Endo/Other    Renal/GU Renal disease     Musculoskeletal  (+) Arthritis , Rheumatoid disorders,    Abdominal   Peds  Hematology   Anesthesia Other Findings EKG ok. Lung nodule. Severe RA. Mostly rides on a scooter, but can walk short distances. Could be a difficult intubation.   Reproductive/Obstetrics                            Anesthesia Physical Anesthesia Plan  ASA: III  Anesthesia Plan: General   Post-op Pain Management:    Induction: Intravenous  PONV Risk Score and Plan:   Airway Management Planned: Oral ETT and LMA  Additional Equipment:   Intra-op Plan:   Post-operative Plan:   Informed Consent: I have reviewed the patients History and Physical, chart, labs and discussed the procedure including the risks, benefits and alternatives for the proposed anesthesia with the patient or authorized representative who has indicated his/her understanding and acceptance.     Plan Discussed with: CRNA  Anesthesia Plan Comments:         Anesthesia Quick Evaluation

## 2018-08-04 NOTE — Addendum Note (Signed)
Addendum  created 08/04/18 1608 by Gunnar Bulla, MD   Sign clinical note

## 2018-08-04 NOTE — Care Management (Signed)
Diffcult airway, could not intubate with [place]. 2 docs tried without success. LMA inserted without difficulty.

## 2018-08-04 NOTE — Anesthesia Post-op Follow-up Note (Signed)
Anesthesia QCDR form completed.        

## 2018-08-05 ENCOUNTER — Telehealth: Payer: Self-pay | Admitting: Urology

## 2018-08-05 ENCOUNTER — Encounter: Payer: Self-pay | Admitting: Urology

## 2018-08-05 DIAGNOSIS — M0579 Rheumatoid arthritis with rheumatoid factor of multiple sites without organ or systems involvement: Secondary | ICD-10-CM

## 2018-08-05 MED ORDER — HYDROCODONE-ACETAMINOPHEN 7.5-325 MG PO TABS: 1.0000 | ORAL_TABLET | Freq: Four times a day (QID) | ORAL | 0 refills | Status: DC | PRN

## 2018-08-05 NOTE — Telephone Encounter (Signed)
She was given a number of prescriptions just yesterday including oxybutynin, Flomax, narcotics.  What is the nature of her pain?  She can also take Motrin.  Hollice Espy, MD

## 2018-08-05 NOTE — Telephone Encounter (Signed)
She definitely was given a script which I personally gave to her and there is documentation in the computer.  It was not escribed.    She can have #15 tabs which is what I prescribed yesterday.  She will have to come and pick it up.  Hollice Espy, MD

## 2018-08-05 NOTE — Telephone Encounter (Signed)
Patient just called back and said that the pharmacy does have her pain meds but she wanted you to still know about her pain.    Jeanne [name]

## 2018-08-05 NOTE — Telephone Encounter (Signed)
Pt called back to let us know the meds were called into pharmacy and she apologized for bothering [place].

## 2018-08-05 NOTE — Telephone Encounter (Signed)
Pt called the office stating she had surgery on 11/25, hospital gave her a courtesy call and advised pt to call Dr. Cherrie Gauze office, pt calls today stating she is in a lot of pain and would like to have something before the holiday/weekend.  Please advise pt @ [phone]. Pharmacy of choice [facility] on [address]

## 2018-08-05 NOTE — Progress Notes (Signed)
Pt experiencing jaw pain from difficult intubation.

## 2018-08-05 NOTE — Telephone Encounter (Signed)
Her pain is from her difficult airway and attempted manipulation of her head and neck.  This should improve with time.  Hollice Espy, MD

## 2018-08-05 NOTE — Telephone Encounter (Signed)
Patient called back and said that she is hurting in her jaws, neck and her hips. She also stated that she never got a script for pain meds. She said that she looked all thru the bag that she had from yesterday and it was not in there. She said she would call the drug store and see if they had it. She said she had some pain meds that another doctor gave her and she took some but it didn't help. She wants someone to call her back.    Sharyn Lull

## 2018-08-06 NOTE — Telephone Encounter (Signed)
Patient notified

## 2018-08-11 ENCOUNTER — Encounter: Payer: Self-pay | Admitting: Urology

## 2018-08-14 ENCOUNTER — Other Ambulatory Visit: Payer: Self-pay

## 2018-08-14 ENCOUNTER — Encounter: Payer: Self-pay | Admitting: Urology

## 2018-08-14 DIAGNOSIS — R3915 Urgency of urination: Secondary | ICD-10-CM

## 2018-08-14 DIAGNOSIS — R310 Gross hematuria: Secondary | ICD-10-CM

## 2018-08-14 LAB — STONE ANALYSIS
Ca Oxalate,Monohydr.: 92 %
Ca phos cry stone ql IR: 8 %
STONE WEIGHT KSTONE: 177.9 mg

## 2018-08-15 ENCOUNTER — Other Ambulatory Visit: Payer: Medicare HMO

## 2018-08-15 ENCOUNTER — Telehealth: Payer: Self-pay | Admitting: Urology

## 2018-08-15 DIAGNOSIS — R3915 Urgency of urination: Secondary | ICD-10-CM

## 2018-08-15 DIAGNOSIS — R310 Gross hematuria: Secondary | ICD-10-CM

## 2018-08-15 LAB — MICROSCOPIC EXAMINATION

## 2018-08-15 LAB — URINALYSIS, COMPLETE
Bilirubin, UA: NEGATIVE
Glucose, UA: NEGATIVE
KETONES UA: NEGATIVE
NITRITE UA: POSITIVE — AB
SPEC GRAV UA: 1.025 (ref 1.005–1.030)
Urobilinogen, Ur: 0.2 mg/dL (ref 0.2–1.0)
pH, UA: 5.5 (ref 5.0–7.5)

## 2018-08-15 MED ORDER — AMOXICILLIN-POT CLAVULANATE 875-125 MG PO TABS: 1.0000 | ORAL_TABLET | Freq: Two times a day (BID) | ORAL | 0 refills | Status: DC

## 2018-08-15 NOTE — Telephone Encounter (Signed)
Mrs. Maciolek dropped off an urine specimen today and it is suspicious for infection.  Please make sure it is sent for culture and send in a prescription for Augmentin 875/125, one tablet twice daily for seven days.  She has an appointment with Dr. Erlene Quan on 09/16/2017 for a RUS report and she needs to have her RUS done prior to that appointment.

## 2018-08-15 NOTE — Progress Notes (Signed)
Patient's UA is suspicious for infection.  Please send it for culture and start her on Augmentin 875/125, one twice daily for seven days.

## 2018-08-15 NOTE — Telephone Encounter (Signed)
Patient notified and voiced understanding.

## 2018-08-19 LAB — CULTURE, URINE COMPREHENSIVE

## 2018-08-26 ENCOUNTER — Encounter: Payer: Self-pay | Admitting: Urology

## 2018-08-26 ENCOUNTER — Other Ambulatory Visit: Payer: Self-pay

## 2018-08-26 DIAGNOSIS — N390 Urinary tract infection, site not specified: Secondary | ICD-10-CM

## 2018-08-27 ENCOUNTER — Other Ambulatory Visit: Payer: Medicare HMO

## 2018-08-27 DIAGNOSIS — N390 Urinary tract infection, site not specified: Secondary | ICD-10-CM

## 2018-08-27 LAB — URINALYSIS, COMPLETE
BILIRUBIN UA: NEGATIVE
GLUCOSE, UA: NEGATIVE
KETONES UA: NEGATIVE
LEUKOCYTES UA: NEGATIVE
NITRITE UA: POSITIVE — AB
Protein, UA: NEGATIVE
RBC UA: NEGATIVE
SPEC GRAV UA: 1.025 (ref 1.005–1.030)
Urobilinogen, Ur: 0.2 mg/dL (ref 0.2–1.0)
pH, UA: 6 (ref 5.0–7.5)

## 2018-08-27 LAB — MICROSCOPIC EXAMINATION: Epithelial Cells (non renal): NONE SEEN /hpf (ref 0–10)

## 2018-08-28 ENCOUNTER — Encounter: Payer: Self-pay | Admitting: Urology

## 2018-08-29 ENCOUNTER — Other Ambulatory Visit: Payer: Self-pay | Admitting: Family Medicine

## 2018-08-29 ENCOUNTER — Encounter: Payer: Self-pay | Admitting: Family Medicine

## 2018-08-29 DIAGNOSIS — M0579 Rheumatoid arthritis with rheumatoid factor of multiple sites without organ or systems involvement: Secondary | ICD-10-CM

## 2018-08-29 MED ORDER — HYDROCODONE-ACETAMINOPHEN 7.5-325 MG PO TABS: 1.0000 | ORAL_TABLET | Freq: Four times a day (QID) | ORAL | 0 refills | Status: DC | PRN

## 2018-09-01 ENCOUNTER — Other Ambulatory Visit: Payer: Self-pay | Admitting: Urology

## 2018-09-01 ENCOUNTER — Telehealth: Payer: Self-pay | Admitting: Urology

## 2018-09-01 DIAGNOSIS — R319 Hematuria, unspecified: Secondary | ICD-10-CM

## 2018-09-01 DIAGNOSIS — N1330 Unspecified hydronephrosis: Secondary | ICD-10-CM

## 2018-09-01 DIAGNOSIS — N390 Urinary tract infection, site not specified: Secondary | ICD-10-CM

## 2018-09-01 MED ORDER — CIPROFLOXACIN HCL 500 MG PO TABS: 500.0000 mg | ORAL_TABLET | Freq: Two times a day (BID) | ORAL | 0 refills | Status: DC

## 2018-09-01 NOTE — Telephone Encounter (Signed)
My chart message sent report not finalized

## 2018-09-01 NOTE — Telephone Encounter (Signed)
Pt called office asking about u/a and culture results from 12/18.  Please give pt a call.

## 2018-09-01 NOTE — Progress Notes (Signed)
Cipro to pharmacy.

## 2018-09-02 LAB — CULTURE, URINE COMPREHENSIVE

## 2018-09-04 ENCOUNTER — Encounter: Payer: Self-pay | Admitting: Urology

## 2018-09-04 ENCOUNTER — Other Ambulatory Visit: Payer: Self-pay

## 2018-09-04 MED ORDER — NITROFURANTOIN MONOHYD MACRO 100 MG PO CAPS: 100.0000 mg | ORAL_CAPSULE | Freq: Two times a day (BID) | ORAL | 0 refills | Status: DC

## 2018-09-05 ENCOUNTER — Ambulatory Visit: Payer: Medicare HMO

## 2018-09-08 ENCOUNTER — Other Ambulatory Visit: Payer: Self-pay | Admitting: Family Medicine

## 2018-09-08 ENCOUNTER — Encounter: Payer: Self-pay | Admitting: Family Medicine

## 2018-09-08 DIAGNOSIS — J00 Acute nasopharyngitis [common cold]: Secondary | ICD-10-CM

## 2018-09-08 MED ORDER — PSEUDOEPH-BROMPHEN-DM 30-2-10 MG/5ML PO SYRP: 5.0000 mL | ORAL_SOLUTION | Freq: Four times a day (QID) | ORAL | 0 refills | Status: DC | PRN

## 2018-09-11 ENCOUNTER — Ambulatory Visit
Admission: RE | Admit: 2018-09-11 | Discharge: 2018-09-11 | Disposition: A | Discharge status: Home / Self Care | Payer: Medicare HMO | Source: Ambulatory Visit | Attending: Urology | Admitting: Urology

## 2018-09-11 DIAGNOSIS — N1330 Unspecified hydronephrosis: Secondary | ICD-10-CM | POA: Diagnosis not present

## 2018-09-11 DIAGNOSIS — N132 Hydronephrosis with renal and ureteral calculous obstruction: Secondary | ICD-10-CM | POA: Diagnosis not present

## 2018-09-16 ENCOUNTER — Encounter: Payer: Self-pay | Admitting: Urology

## 2018-09-16 ENCOUNTER — Ambulatory Visit: Payer: Medicare HMO | Admitting: Urology

## 2018-09-16 VITALS — BP 155/79 | HR 73 | Ht 64.0 in | Wt 156.0 lb

## 2018-09-16 DIAGNOSIS — N390 Urinary tract infection, site not specified: Secondary | ICD-10-CM | POA: Diagnosis not present

## 2018-09-16 DIAGNOSIS — N200 Calculus of kidney: Secondary | ICD-10-CM | POA: Diagnosis not present

## 2018-09-16 DIAGNOSIS — N895 Stricture and atresia of vagina: Secondary | ICD-10-CM | POA: Diagnosis not present

## 2018-09-16 DIAGNOSIS — N3941 Urge incontinence: Secondary | ICD-10-CM | POA: Diagnosis not present

## 2018-09-16 DIAGNOSIS — R310 Gross hematuria: Secondary | ICD-10-CM

## 2018-09-16 DIAGNOSIS — N1330 Unspecified hydronephrosis: Secondary | ICD-10-CM

## 2018-09-16 NOTE — Progress Notes (Signed)
09/16/2018  2:24 PM   Jeanne [name] December 06, 1950 [id number]  Referring provider: Margo Common, [address] [place], [place] [address]  Chief Complaint  Patient presents with  . ultrasound results    HPI: Jeanne [name] is a 68 y.o. female with vaginal stenosis, recurrent urinary tract infections, radiation cystitis, history of left sided kidney stones, and chronic left hydronephrosis. She returns today for post op visit with RUS and to review intraoperative findings.    She was taken to the operating room on 08/04/2018 for pelvic exam, vaginoscopy, cystogram urethral dilation bilateral retrograde pyelogram left ureteroscopy with laser lithotripsy and left ureteral stent placement.  Her operatively, she was found to have severe vaginal stenosis and urethral stenosis.  I was unable to directly visualize her urethra without use of the cystoscope.  Her urethra was narrowed.  Her vaginal introitus was severely stenotic.  Her bladder had findings consistent with radiation cystitis.  Large left-sided stone burden was able to be treated.  Intra-Op, she had elongated stenotic infundibula with left-sided renal pelvic fullness without overt hydronephrosis.  Preoperatively, she was noted to have severe hydronephrosis, postop on renal ultrasound completed 09/11/2018 showed stent moderate left-sided hydronephrosis but improved from previous CT scan.  Stone composition 90% calcium oxalate monohydrate, 8% calcium phosphate.  Postop, she denies any flank pain.  Patient believes she has had 2 infections since her surgery (docuented 08/16/15 and 08/27/18) with associated symptoms of fatigue, suprapubic pain, cloudy malodorous urine and increased urinary frequency.She treated for both with antibiotics. She last finished her antibiotics on 09/12/2018. Symptoms resolved after completion of abx.    She denies gross hematuria. She reports decreased volume with voids and incomplete bladder emptying. She  admits to straining in order to completely empty her bladder and occasional episodes of incontinence.  She has taken oxybutynin in the past with no symptom alleviation.     PMH: Past Medical History:  Diagnosis Date  . Arthritis    RA  . Cervical cancer ([place])   . Complication of anesthesia   . History of kidney stones   . Hypertension   . PONV (postoperative nausea and vomiting)     Surgical History: Past Surgical History:  Procedure Laterality Date  . ABDOMINAL HYSTERECTOMY  09/10/2003  . CYSTOGRAM N/A 08/04/2018   Procedure: CYSTOGRAM WITH URETHAL DILATION;  Surgeon: Hollice Espy, MD;  Location: [facility] ORS;  Service: Urology;  Laterality: N/A;  . CYSTOSCOPY W/ RETROGRADES Bilateral 08/04/2018   Procedure: CYSTOSCOPY WITH RETROGRADE PYELOGRAM;  Surgeon: Hollice Espy, MD;  Location: [facility] ORS;  Service: Urology;  Laterality: Bilateral;  . CYSTOSCOPY/URETEROSCOPY/HOLMIUM LASER/STENT PLACEMENT Left 08/04/2018   Procedure: CYSTOSCOPY/URETEROSCOPY/HOLMIUM LASER/STENT PLACEMENT;  Surgeon: Hollice Espy, MD;  Location: [facility] ORS;  Service: Urology;  Laterality: Left;  . JOINT REPLACEMENT    . TOTAL HIP ARTHROPLASTY Right 1972  . TOTAL HIP ARTHROPLASTY Left 1981  . TOTAL KNEE ARTHROPLASTY Right 1990    Home Medications:  Allergies as of 09/16/2018      Reactions   Codeine Nausea Only, Nausea And Vomiting   Sulfa Antibiotics Rash   Mouth ulcers      Medication List       Accurate as of September 16, 2018  2:24 PM. Always use your most recent med list.        BC FAST PAIN RELIEF PO Take 1 Package by mouth daily as needed (headache).   diclofenac 75 MG EC tablet Commonly known as:  VOLTAREN TAKE 1 TABLET BY MOUTH  TWICE A DAY   docusate sodium 100 MG capsule Commonly known as:  COLACE Take 1 capsule (100 mg total) by mouth 2 (two) times daily.   HYDROcodone-acetaminophen 7.5-325 MG tablet Commonly known as:  NORCO Take 1 tablet by mouth every 6 (six) hours as needed  for moderate pain. Reviewed controlled substance record in PMP Aware.   lactobacillus acidophilus Tabs tablet Take 1 tablet by mouth daily.   metoprolol tartrate 25 MG tablet Commonly known as:  LOPRESSOR TAKE 1 TABLET (25 MG TOTAL) BY MOUTH 2 (TWO) TIMES DAILY.   oxybutynin 5 MG tablet Commonly known as:  DITROPAN Take 1 tablet (5 mg total) by mouth every 8 (eight) hours as needed for bladder spasms.   tamsulosin 0.4 MG Caps capsule Commonly known as:  FLOMAX Take 1 capsule (0.4 mg total) by mouth daily.       Allergies:  Allergies  Allergen Reactions  . Codeine Nausea Only and Nausea And Vomiting  . Sulfa Antibiotics Rash    Mouth ulcers    Family History: Family History  Problem Relation Age of Onset  . Breast cancer Mother   . Hypertension Father   . Cancer Father   . Prostate cancer Neg Hx   . Kidney cancer Neg Hx   . Bladder Cancer Neg Hx     Social History:  reports that she quit smoking about 29 years ago. She quit after 20.00 years of use. She has never used smokeless tobacco. She reports that she does not drink alcohol or use drugs.  ROS: UROLOGY Frequent Urination?: No Hard to postpone urination?: No Burning/pain with urination?: No Get up at night to urinate?: No Leakage of urine?: No Urine stream starts and stops?: No Trouble starting stream?: No Do you have to strain to urinate?: No Blood in urine?: No Urinary tract infection?: No Sexually transmitted disease?: No Injury to kidneys or bladder?: No Painful intercourse?: No Weak stream?: No Currently pregnant?: No Vaginal bleeding?: No Last menstrual period?: n  Gastrointestinal Nausea?: No Vomiting?: No Indigestion/heartburn?: No Diarrhea?: No Constipation?: No  Constitutional Fever: No Night sweats?: No Weight loss?: No Fatigue?: No  Skin Skin rash/lesions?: No Itching?: No  Eyes Blurred vision?: No Double vision?: No  Ears/Nose/Throat Sore throat?: No Sinus problems?:  No  Hematologic/Lymphatic Swollen glands?: No Easy bruising?: No  Cardiovascular Leg swelling?: No Chest pain?: No  Respiratory Cough?: No Shortness of breath?: No  Endocrine Excessive thirst?: No  Musculoskeletal Back pain?: No Joint pain?: No  Neurological Headaches?: No Dizziness?: No  Psychologic Depression?: No Anxiety?: No  Physical Exam: BP (!) 155/79 (BP Location: Left Arm, Patient Position: Sitting)   Pulse 73   Ht 5\' 4"  (1.626 m)   Wt 156 lb (70.8 kg)   BMI 26.78 kg/m   Constitutional:  Alert and oriented, No acute distress.  Accompanied by husband today. Respiratory: Normal respiratory effort, no increased work of breathing. GI: Abdomen is soft, nontender, nondistended, no abdominal masses GU: No CVA tenderness Skin: No rashes, bruises or suspicious lesions. Neurologic: Grossly intact, no focal deficits, moving all 4 extremities. Psychiatric: Normal mood and affect.  Laboratory Data: Lab Results  Component Value Date   WBC 7.9 01/16/2018   HGB 15.0 01/16/2018   HCT 44.8 01/16/2018   MCV 88 01/16/2018   PLT 306 01/16/2018    Lab Results  Component Value Date   CREATININE 0.81 01/16/2018   Urinalysis Lab Results  Component Value Date   LABMICR See below: 08/27/2018  WBCUA 0-5 08/27/2018   RBCUA 0-2 08/27/2018   LABEPIT None seen 08/27/2018   MUCUS Present (A) 08/15/2018   BACTERIA Many (A) 08/27/2018    Pertinent Imaging: Results for orders placed during the hospital encounter of 09/11/18  US RENAL   Narrative CLINICAL DATA:  68 year old with known LEFT hydronephrosis and BILATERAL nephrolithiasis. LEFT ureteroscopy and stone extraction 08/04/2018.  EXAM: RENAL / URINARY TRACT ULTRASOUND COMPLETE  COMPARISON:  CT abdomen and pelvis 07/28/2018 and urinary tract ultrasound 06/06/2016.  FINDINGS: Right Kidney:  Renal measurements: Approximately 10.1 x 5.0 x 5.2 cm = volume: 137 mL. Likely parapelvic cysts involving the RIGHT  kidney (versus mild hydronephrosis). Mild diffuse cortical thinning with focal scarring in the UPPER pole as noted on CT. No parenchymal masses. The multiple small calculi seen on CT are not visible at ultrasound.  Left Kidney:  Renal measurements: Approximately 11.2 x 5.4 x 5.0 cm = volume: 167 mL. Moderate hydronephrosis, though less than on the prior CT. Shadowing LEFT LOWER pole calculus/calculi as noted on the CT, measuring approximately 1.5 cm. Mild diffuse cortical thinning. No parenchymal masses.  The appearance of both kidneys is unchanged after voiding.  Bladder:  Normal for degree of distention. RIGHT ureteral jets visualized at color Doppler evaluation. LEFT ureteral jet not visualized.  IMPRESSION: 1. Likely parapelvic cysts involving the RIGHT kidney (versus mild hydronephrosis), unchanged since the CT 07/28/2018. 2. The multiple RIGHT renal calculi identified on the prior CT are not visible at ultrasound. 3. Moderate LEFT hydronephrosis, improved since the prior CT. 4. Shadowing LEFT LOWER pole renal calculus/adjacent calculi as noted on the prior CT. 5. LEFT ureteral jet not visualized at color Doppler evaluation.   Electronically Signed   By: Evangeline Dakin M.D.   On: 09/11/2018 16:14    I have personally reviewed these images today.  Assessment & Plan:    1. Chronic Left Hydronephrosis Improved status post cystoscopy, likely chronic Notably, intraoperatively, she had renal pelvic fullness without caliectasis thus I suspect there is some component of low-grade UPJ obstruction combined with parapelvic cyst rather than severe obstruction Poor surgical candidate, will follow with surveillance ultrasound in 6 months (extremely difficult airway)  2. Recurrent UTIs Multiple risk factors present for recurrent UTIs, primarily concerns for anatomic changes related to radiation including vaginal stenosis, abnormal external genitalia with atrophic  vaginitis.  Advised to increase water intake and implement probiotics/Vitamin C to acidify urine  Discussed my obersevations during cystoscopy as far as her anatomy. I explained why this increases the difficulty of diagnosing UTIs  We reviewed symptoms of UTI including fevers, chills, gross hematuria, mental status changes, dysuria, suprapubic pain, back pain and/or sudden worsening of urinary symptoms. Advised not to have urine checked or be treated for UTI if not experiencing symptoms.  Discussed antibiotic stewardship and explained the increased risk of antibiotic resistance with overexposure to antibiotics  3. History of kidney stones Multiple stones no longer noted on 09/2018 renal ultrasound s/p cysto  Stone composition reviewed  4. Urge Incontinence Increased freguency/urgency with occasional episodes of incontinence No change in symptoms with post-op oxybutynin.  Starting 68-month trial of Myrbetriq 25mg , advised to call or send MyChart message whether or not this was helpful We will push dose to 50 mg if minimal effect  5. Radiation cystitis Discussed hyperbaric oxygen as a treatment option if she experiences bleeding again.   6. Gross Hematuria Likely secondary to radiation cystitis Currently asymptommatic  Return in about 6 months (around 03/17/2019), or with UTI symptoms, for RUS and pt will message in 1 month for Myrbetriq trial.  Hollice Espy, MD [facility] [address] [place], [place] [address] [phone]  I, Stephania Fragmin , am acting as a scribe for Hollice Espy, MD  I have reviewed the above documentation for accuracy and completeness, and I agree with the above.   Hollice Espy, MD  I spent 40 min with this patient of which greater than 50% was spent in counseling and coordination of care with the patient.

## 2018-09-22 ENCOUNTER — Encounter: Payer: Self-pay | Admitting: Family Medicine

## 2018-09-23 ENCOUNTER — Encounter: Payer: Self-pay | Admitting: Family Medicine

## 2018-09-23 ENCOUNTER — Other Ambulatory Visit: Payer: Self-pay | Admitting: Family Medicine

## 2018-09-23 DIAGNOSIS — M0579 Rheumatoid arthritis with rheumatoid factor of multiple sites without organ or systems involvement: Secondary | ICD-10-CM

## 2018-09-23 MED ORDER — HYDROCODONE-ACETAMINOPHEN 7.5-325 MG PO TABS: 1.0000 | ORAL_TABLET | Freq: Four times a day (QID) | ORAL | 0 refills | Status: DC | PRN

## 2018-10-12 ENCOUNTER — Encounter: Payer: Self-pay | Admitting: Urology

## 2018-10-14 ENCOUNTER — Other Ambulatory Visit: Payer: Self-pay

## 2018-10-14 MED ORDER — MIRABEGRON ER 25 MG PO TB24: 25.0000 mg | ORAL_TABLET | Freq: Every day | ORAL | 11 refills | Status: DC

## 2018-10-20 ENCOUNTER — Encounter: Payer: Self-pay | Admitting: Urology

## 2018-10-20 ENCOUNTER — Encounter: Payer: Self-pay | Admitting: Family Medicine

## 2018-10-20 ENCOUNTER — Telehealth: Payer: Self-pay

## 2018-10-20 ENCOUNTER — Other Ambulatory Visit: Payer: Self-pay | Admitting: *Deleted

## 2018-10-20 DIAGNOSIS — M0579 Rheumatoid arthritis with rheumatoid factor of multiple sites without organ or systems involvement: Secondary | ICD-10-CM

## 2018-10-20 MED ORDER — HYDROCODONE-ACETAMINOPHEN 7.5-325 MG PO TABS: 1.0000 | ORAL_TABLET | Freq: Four times a day (QID) | ORAL | 0 refills | Status: DC | PRN

## 2018-10-20 NOTE — Telephone Encounter (Signed)
Mychart message

## 2018-10-21 ENCOUNTER — Telehealth: Payer: Self-pay

## 2018-10-21 ENCOUNTER — Encounter: Payer: Self-pay | Admitting: Family Medicine

## 2018-10-21 ENCOUNTER — Other Ambulatory Visit: Payer: Medicare HMO

## 2018-10-21 DIAGNOSIS — R300 Dysuria: Secondary | ICD-10-CM | POA: Diagnosis not present

## 2018-10-21 DIAGNOSIS — N390 Urinary tract infection, site not specified: Secondary | ICD-10-CM | POA: Diagnosis not present

## 2018-10-21 LAB — URINALYSIS, COMPLETE
BILIRUBIN UA: NEGATIVE
GLUCOSE, UA: NEGATIVE
Ketones, UA: NEGATIVE
NITRITE UA: POSITIVE — AB
Specific Gravity, UA: 1.025 (ref 1.005–1.030)
Urobilinogen, Ur: 1 mg/dL (ref 0.2–1.0)
pH, UA: 6 (ref 5.0–7.5)

## 2018-10-21 LAB — MICROSCOPIC EXAMINATION: EPITHELIAL CELLS (NON RENAL): NONE SEEN /HPF (ref 0–10)

## 2018-10-21 NOTE — Telephone Encounter (Signed)
Macrobid twice daily x10 days.  Hollice Espy, MD

## 2018-10-21 NOTE — Telephone Encounter (Signed)
Patient dropped off UA today, grossly positive will send for culture. Would you like to start abx?

## 2018-10-22 MED ORDER — NITROFURANTOIN MONOHYD MACRO 100 MG PO CAPS: 100.0000 mg | ORAL_CAPSULE | Freq: Two times a day (BID) | ORAL | 0 refills | Status: AC

## 2018-10-22 NOTE — Telephone Encounter (Signed)
Macrobid Rx sent to [facility] on [address]. Called and advised patient. Patient verbalized understanding.

## 2018-10-23 ENCOUNTER — Encounter: Payer: Self-pay | Admitting: Urology

## 2018-10-23 LAB — CULTURE, URINE COMPREHENSIVE

## 2018-10-24 ENCOUNTER — Telehealth: Payer: Self-pay | Admitting: Family Medicine

## 2018-10-24 NOTE — Telephone Encounter (Signed)
Patient was notified of results and voiced understanding. She will call if the ABX do not work.

## 2018-10-24 NOTE — Telephone Encounter (Signed)
-----   Message from Hollice Espy, MD sent at 10/23/2018  4:41 PM EST ----- She grew E. coli only intermediately sensitive to [place].  Unfortunately, it is resistant to fluoroquinolones such as Cipro and Levaquin.  She is allergic to Bactrim.  Were running out of oral options.  We will have her finish this course and if her symptoms worsen or fail to resolve, we can try something like fosfomycin which was not tested.  Is an old antibiotic but has some fairly decent coverage but also expensive.  We can also refer her to infectious disease.  Hollice Espy, MD

## 2018-11-07 ENCOUNTER — Encounter: Payer: Self-pay | Admitting: Urology

## 2018-11-16 ENCOUNTER — Encounter: Payer: Self-pay | Admitting: Family Medicine

## 2018-11-17 ENCOUNTER — Encounter: Payer: Self-pay | Admitting: Family Medicine

## 2018-11-17 ENCOUNTER — Other Ambulatory Visit: Payer: Self-pay | Admitting: Family Medicine

## 2018-11-17 DIAGNOSIS — M0579 Rheumatoid arthritis with rheumatoid factor of multiple sites without organ or systems involvement: Secondary | ICD-10-CM

## 2018-11-17 MED ORDER — HYDROCODONE-ACETAMINOPHEN 7.5-325 MG PO TABS: 1.0000 | ORAL_TABLET | Freq: Four times a day (QID) | ORAL | 0 refills | Status: DC | PRN

## 2018-11-24 ENCOUNTER — Encounter: Payer: Self-pay | Admitting: Family Medicine

## 2018-11-24 ENCOUNTER — Other Ambulatory Visit (INDEPENDENT_AMBULATORY_CARE_PROVIDER_SITE_OTHER): Payer: Medicare HMO | Admitting: *Deleted

## 2018-11-24 DIAGNOSIS — N3020 Other chronic cystitis without hematuria: Secondary | ICD-10-CM | POA: Diagnosis not present

## 2018-11-24 LAB — POCT URINALYSIS DIPSTICK
Appearance: ABNORMAL
Glucose, UA: NEGATIVE
Nitrite, UA: POSITIVE
Odor: ABNORMAL
PH UA: 6 (ref 5.0–8.0)
Protein, UA: POSITIVE — AB
Spec Grav, UA: 1.02 (ref 1.010–1.025)
UROBILINOGEN UA: 2 U/dL — AB

## 2018-11-24 NOTE — Telephone Encounter (Signed)
Patient may have husband bring in a urine specimen for urinalysis and possibly a culture.

## 2018-11-25 ENCOUNTER — Encounter: Payer: Self-pay | Admitting: Family Medicine

## 2018-11-25 NOTE — Telephone Encounter (Signed)
May refill the Macrobid until culture report becomes available. She should contact Dr. Erlene Quan about this report when it comes.

## 2018-11-25 NOTE — Progress Notes (Signed)
Patient: Jeanne [name] Female    DOB: 1950-10-14   68 y.o.   MRN: [id number] Visit Date: 11/27/2018  Today's Provider: [name], PA   Chief Complaint  Patient presents with   Follow-up   Subjective:     HPI   Chronic cystitis From 11/25/2018- UA and urine culture done. Developed foul odor to urine with shaking chills a week ago with some back aches and burning with urination. Had left cystoscopy with bladder biopsy and surgical removal of kidney stone and stent placement 08-04-18 by Dr. Erlene Quan. Had difficulty with anesthesia intubation due to stiffness of jaws and neck limiting insertion.  Past Medical History:  Diagnosis Date   Arthritis    RA   Cervical cancer ([place])    Complication of anesthesia    History of kidney stones    Hypertension    PONV (postoperative nausea and vomiting)    Past Surgical History:  Procedure Laterality Date   ABDOMINAL HYSTERECTOMY  09/10/2003   CYSTOGRAM N/A 08/04/2018   Procedure: CYSTOGRAM WITH URETHAL DILATION;  Surgeon: Hollice Espy, MD;  Location: [facility] ORS;  Service: Urology;  Laterality: N/A;   CYSTOSCOPY W/ RETROGRADES Bilateral 08/04/2018   Procedure: CYSTOSCOPY WITH RETROGRADE PYELOGRAM;  Surgeon: Hollice Espy, MD;  Location: [facility];  Service: Urology;  Laterality: Bilateral;   CYSTOSCOPY/URETEROSCOPY/HOLMIUM LASER/STENT PLACEMENT Left 08/04/2018   Procedure: CYSTOSCOPY/URETEROSCOPY/HOLMIUM LASER/STENT PLACEMENT;  Surgeon: Hollice Espy, MD;  Location: [facility];  Service: Urology;  Laterality: Left;   JOINT REPLACEMENT     TOTAL HIP ARTHROPLASTY Right 1972   TOTAL HIP ARTHROPLASTY Left 1981   TOTAL KNEE ARTHROPLASTY Right 1990   Family History  Problem Relation Age of Onset   Breast cancer Mother    Hypertension Father    Cancer Father    Prostate cancer Neg Hx    Kidney cancer Neg Hx    Bladder Cancer Neg Hx    Allergies  Allergen Reactions   Codeine Nausea Only and Nausea  And Vomiting   Sulfa Antibiotics Rash    Mouth ulcers    Current Outpatient Medications:    Aspirin-Caffeine (BC FAST PAIN RELIEF PO), Take 1 Package by mouth daily as needed (headache). , Disp: , Rfl:    CRANBERRY PO, Take by mouth., Disp: , Rfl:    diclofenac (VOLTAREN) 75 MG EC tablet, TAKE 1 TABLET BY MOUTH TWICE A DAY (Patient taking differently: Take 75 mg by mouth 2 (two) times daily. ), Disp: 60 tablet, Rfl: 3   docusate sodium (COLACE) 100 MG capsule, Take 1 capsule (100 mg total) by mouth 2 (two) times daily., Disp: 60 capsule, Rfl: 0   HYDROcodone-acetaminophen (NORCO) 7.5-325 MG tablet, Take 1 tablet by mouth every 6 (six) hours as needed for moderate pain. Reviewed controlled substance record in PMP Aware., Disp: 30 tablet, Rfl: 0   metoprolol tartrate (LOPRESSOR) 25 MG tablet, TAKE 1 TABLET (25 MG TOTAL) BY MOUTH 2 (TWO) TIMES DAILY., Disp: 180 tablet, Rfl: 3   mirabegron ER (MYRBETRIQ) 25 MG TB24 tablet, Take 1 tablet (25 mg total) by mouth daily., Disp: 30 tablet, Rfl: 11   Probiotic Product (PROBIOTIC PO), Take by mouth., Disp: , Rfl:    lactobacillus acidophilus (BACID) TABS tablet, Take 1 tablet by mouth daily., Disp: , Rfl:   Review of Systems  Constitutional: Negative for appetite change, chills, fatigue and fever.  Respiratory: Negative for chest tightness and shortness of breath.   Cardiovascular: Negative for chest pain and  palpitations.  Gastrointestinal: Negative for abdominal pain, nausea and vomiting.  Neurological: Negative for dizziness and weakness.   Social History   Tobacco Use   Smoking status: Former Smoker    Years: 20.00    Last attempt to quit: 09/09/1989    Years since quitting: 29.2   Smokeless tobacco: Never Used   Tobacco comment: QUIT IN 1990  Substance Use Topics   Alcohol use: No    Alcohol/week: 0.0 standard drinks     Objective:   BP 102/64 (BP Location: Right Arm, Patient Position: Sitting, Cuff Size: Large)    Pulse  83    Temp 97.7 F (36.5 C) (Oral)    Resp 16    Wt 154 lb (69.9 kg)    SpO2 96%    BMI 26.43 kg/m  Vitals:   11/27/18 1501  BP: 102/64  Pulse: 83  Resp: 16  Temp: 97.7 F (36.5 C)  TempSrc: Oral  SpO2: 96%  Weight: 154 lb (69.9 kg)   Physical Exam Constitutional:      General: She is not in acute distress.    Appearance: She is well-developed.  HENT:     Head: Normocephalic and atraumatic.     Right Ear: Hearing normal.     Left Ear: Hearing normal.     Nose: Nose normal.  Eyes:     General: Lids are normal. No scleral icterus.       Right eye: No discharge.        Left eye: No discharge.     Conjunctiva/sclera: Conjunctivae normal.  Cardiovascular:     Rate and Rhythm: Normal rate and regular rhythm.     Heart sounds: Normal heart sounds.  Pulmonary:     Effort: Pulmonary effort is normal. No respiratory distress.     Breath sounds: Normal breath sounds.  Abdominal:     General: Bowel sounds are normal.     Palpations: Abdomen is soft.     Tenderness: There is no right CVA tenderness or left CVA tenderness.  Musculoskeletal:     Comments: Gait and Station- Slow stiff tottering gait. Spine, Ribs and Pelvis- Some discomfort in lumbar spine. Mild tenderness without deformities. Very stiff and limited ROM. Right Upper Extremity- Ulnar deviation of fingers at MCP. Very limited ability to grip. Unable to bend finger joints. Stiffness of shoulders and elbows. Left Upper Extremity- Ulnar deviation of MCP joints with straight stiff fingers. Unable to flex into a fist and tender to palpate. Stiffness of shoulders and elbows. Wrists stiff and slightly enlarged. Right Lower Extremity- Hip very stiff with history of joint replacement and knee replacement. Tender to palpate. Left Lower Extremity- Decrease in hip ROM. Pain with crepitus of the left knee. Decrease ROM (flex only to 80 degrees and extend to 170 degrees). Prominent joint with large scar from past surgery to clean up  arthritis and spurs in the 1980's. Upper Extremity- Nodules on elbows. Ulnar deviation of fingers - both hands - with large MCP joints. Extremely diminished ability to flex or extend fingers.Stiffness and tenderness of both ankles. Normal symmetric pulses.  Skin:    Findings: No lesion or rash.  Neurological:     Mental Status: She is alert and oriented to person, place, and time.  Psychiatric:        Speech: Speech normal.        Behavior: Behavior normal.        Thought Content: Thought content normal.       Assessment &  Plan    1. Recurrent UTI Developed some burning with urination, foul odor to urine and blood on tissue after urinating over the past week. Urinalysis on 11-25-18 showed great deal of WBC's and RBC's on microscopic exam. Culture reported mixed bacterial flora with 50,000 - 100,000 colonies. Unable to identify a specific pathogen this time. Culture on 10-21-18 isolated E.coli that was resistant to most oral antibiotics except Septra. Has had some oral irritation from sulfa drugs in the past (no hives, dyspnea - allergic reactions). Recommend rinsing mouth with Glyoxide or Listerine Total Care Mouthwash TID after meals as she takes the Septra. She understands and agrees to try it. May need to consider follow up with urologist or get a second opinion with Dr. Terance Hart at [facility]. Check routine labs to evaluate renal function. - sulfamethoxazole-trimethoprim (BACTRIM DS,SEPTRA DS) 800-160 MG tablet; Take 1 tablet by mouth 2 (two) times daily.  Dispense: 20 tablet; Refill: 0 - CBC with Differential/Platelet - Comprehensive metabolic panel  2. Chronic radiation cystitis Very frequent UTI's since radiation therapy for cervical cancer in 2004. Has had kidney stones requiring surgery 08-04-18. States urologist (Dr. Erlene Quan) found a great deal of scarring during cystoscopy/urethroscopy that is from radiation therapy and recurrent UTI's. With the intubation difficulty during her recent  surgery, patient is considering getting a second opinion from Dr. Terance Hart ([facility]) that evaluated her after her radiation treatments in 2004.  3. 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 Adenosquamous carcinoma of cervix, Stage I-B, Grade 1.  4. Rheumatoid arthritis involving multiple sites with positive rheumatoid factor (HCC) Onset as a child. All joints very stiff and limited ability to grip with hands. Very waddling gait with very short stride. Uses Norco 7.5-325 mg 1 tablet QID prn (30 tablets a month) for pain not controlled by Voltaren 75 mg BID. Feels this regimen makes accomplishing ADL's easier and controls pain.  5. Essential hypertension Well controlled with Metoprolol Tartrate 25 mg BID. No palpitations, dyspnea or peripheral edema. Recheck labs and refill Metoprolol. - metoprolol tartrate (LOPRESSOR) 25 MG tablet; Take 1 tablet (25 mg total) by mouth 2 (two) times daily.  Dispense: 180 tablet; Refill: 3 - Comprehensive metabolic panel - TSH     [name], PA  [facility]

## 2018-11-26 LAB — CULTURE, URINE COMPREHENSIVE

## 2018-11-27 ENCOUNTER — Ambulatory Visit (INDEPENDENT_AMBULATORY_CARE_PROVIDER_SITE_OTHER): Payer: Medicare HMO | Admitting: Family Medicine

## 2018-11-27 ENCOUNTER — Encounter: Payer: Self-pay | Admitting: Family Medicine

## 2018-11-27 ENCOUNTER — Other Ambulatory Visit: Payer: Self-pay

## 2018-11-27 VITALS — BP 102/64 | HR 83 | Temp 97.7°F | Resp 16 | Wt 154.0 lb

## 2018-11-27 DIAGNOSIS — C539 Malignant neoplasm of cervix uteri, unspecified: Secondary | ICD-10-CM | POA: Diagnosis not present

## 2018-11-27 DIAGNOSIS — N390 Urinary tract infection, site not specified: Secondary | ICD-10-CM | POA: Diagnosis not present

## 2018-11-27 DIAGNOSIS — I10 Essential (primary) hypertension: Secondary | ICD-10-CM

## 2018-11-27 DIAGNOSIS — M0579 Rheumatoid arthritis with rheumatoid factor of multiple sites without organ or systems involvement: Secondary | ICD-10-CM

## 2018-11-27 DIAGNOSIS — N3040 Irradiation cystitis without hematuria: Secondary | ICD-10-CM

## 2018-11-27 MED ORDER — METOPROLOL TARTRATE 25 MG PO TABS: 25.0000 mg | ORAL_TABLET | Freq: Two times a day (BID) | ORAL | 3 refills | Status: DC

## 2018-11-27 MED ORDER — SULFAMETHOXAZOLE-TRIMETHOPRIM 800-160 MG PO TABS: 1.0000 | ORAL_TABLET | Freq: Two times a day (BID) | ORAL | 0 refills | Status: DC

## 2018-11-28 LAB — COMPREHENSIVE METABOLIC PANEL
ALT: 52 IU/L — ABNORMAL HIGH (ref 0–32)
AST: 43 IU/L — ABNORMAL HIGH (ref 0–40)
Albumin/Globulin Ratio: 1.5 (ref 1.2–2.2)
Albumin: 3.9 g/dL (ref 3.8–4.8)
Alkaline Phosphatase: 274 IU/L — ABNORMAL HIGH (ref 39–117)
BUN/Creatinine Ratio: 23 (ref 12–28)
BUN: 19 mg/dL (ref 8–27)
Bilirubin Total: 0.5 mg/dL (ref 0.0–1.2)
CO2: 17 mmol/L — AB (ref 20–29)
Calcium: 9.2 mg/dL (ref 8.7–10.3)
Chloride: 105 mmol/L (ref 96–106)
Creatinine, Ser: 0.83 mg/dL (ref 0.57–1.00)
GFR calc Af Amer: 86 mL/min/{1.73_m2} (ref >59)
GFR calc non Af Amer: 74 mL/min/{1.73_m2} (ref >59)
GLUCOSE: 126 mg/dL — AB (ref 65–99)
Globulin, Total: 2.6 g/dL (ref 1.5–4.5)
Potassium: 4.2 mmol/L (ref 3.5–5.2)
Sodium: 140 mmol/L (ref 134–144)
Total Protein: 6.5 g/dL (ref 6.0–8.5)

## 2018-11-28 LAB — CBC WITH DIFFERENTIAL/PLATELET
BASOS ABS: 0.1 10*3/uL (ref 0.0–0.2)
Basos: 1 %
EOS (ABSOLUTE): 0.3 10*3/uL (ref 0.0–0.4)
Eos: 4 %
Hematocrit: 43.5 % (ref 34.0–46.6)
Hemoglobin: 14.6 g/dL (ref 11.1–15.9)
Immature Grans (Abs): 0 10*3/uL (ref 0.0–0.1)
Immature Granulocytes: 0 %
Lymphocytes Absolute: 1.7 10*3/uL (ref 0.7–3.1)
Lymphs: 21 %
MCH: 30 pg (ref 26.6–33.0)
MCHC: 33.6 g/dL (ref 31.5–35.7)
MCV: 90 fL (ref 79–97)
MONOCYTES: 6 %
MONOS ABS: 0.5 10*3/uL (ref 0.1–0.9)
Neutrophils Absolute: 5.5 10*3/uL (ref 1.4–7.0)
Neutrophils: 68 %
PLATELETS: 317 10*3/uL (ref 150–450)
RBC: 4.86 x10E6/uL (ref 3.77–5.28)
RDW: 13.5 % (ref 11.7–15.4)
WBC: 8 10*3/uL (ref 3.4–10.8)

## 2018-11-28 LAB — TSH: TSH: 2.32 u[IU]/mL (ref 0.450–4.500)

## 2018-12-08 ENCOUNTER — Encounter: Payer: Self-pay | Admitting: Family Medicine

## 2018-12-09 ENCOUNTER — Telehealth (INDEPENDENT_AMBULATORY_CARE_PROVIDER_SITE_OTHER): Payer: Medicare HMO

## 2018-12-09 DIAGNOSIS — N3090 Cystitis, unspecified without hematuria: Secondary | ICD-10-CM | POA: Diagnosis not present

## 2018-12-09 LAB — POCT URINALYSIS DIPSTICK
BILIRUBIN UA: NEGATIVE
Glucose, UA: NEGATIVE
Ketones, UA: NEGATIVE
Leukocytes, UA: NEGATIVE
Nitrite, UA: NEGATIVE
Protein, UA: POSITIVE — AB
Spec Grav, UA: 1.025 (ref 1.010–1.025)
Urobilinogen, UA: 0.2 E.U./dL
pH, UA: 5 (ref 5.0–8.0)

## 2018-12-10 NOTE — Telephone Encounter (Signed)
Urine sample brought into the office.

## 2018-12-11 ENCOUNTER — Encounter: Payer: Self-pay | Admitting: Family Medicine

## 2018-12-12 ENCOUNTER — Other Ambulatory Visit: Payer: Self-pay | Admitting: Family Medicine

## 2018-12-12 LAB — CULTURE, URINE COMPREHENSIVE

## 2018-12-12 MED ORDER — DOXYCYCLINE HYCLATE 100 MG PO TABS: 100.0000 mg | ORAL_TABLET | Freq: Two times a day (BID) | ORAL | 0 refills | Status: DC

## 2018-12-15 ENCOUNTER — Encounter: Payer: Self-pay | Admitting: Family Medicine

## 2018-12-15 ENCOUNTER — Other Ambulatory Visit: Payer: Self-pay | Admitting: Family Medicine

## 2018-12-15 DIAGNOSIS — M0579 Rheumatoid arthritis with rheumatoid factor of multiple sites without organ or systems involvement: Secondary | ICD-10-CM

## 2018-12-15 MED ORDER — HYDROCODONE-ACETAMINOPHEN 7.5-325 MG PO TABS: 1.0000 | ORAL_TABLET | Freq: Four times a day (QID) | ORAL | 0 refills | Status: DC | PRN

## 2018-12-24 ENCOUNTER — Encounter: Payer: Self-pay | Admitting: Family Medicine

## 2018-12-25 ENCOUNTER — Encounter: Payer: Self-pay | Admitting: Family Medicine

## 2019-01-02 ENCOUNTER — Encounter: Payer: Self-pay | Admitting: Family Medicine

## 2019-01-08 ENCOUNTER — Encounter: Payer: Self-pay | Admitting: Family Medicine

## 2019-01-09 ENCOUNTER — Other Ambulatory Visit (INDEPENDENT_AMBULATORY_CARE_PROVIDER_SITE_OTHER): Payer: Medicare HMO

## 2019-01-09 DIAGNOSIS — N390 Urinary tract infection, site not specified: Secondary | ICD-10-CM | POA: Diagnosis not present

## 2019-01-09 LAB — POCT URINALYSIS DIPSTICK
Bilirubin, UA: NEGATIVE
Glucose, UA: NEGATIVE
Ketones, UA: NEGATIVE
Nitrite, UA: POSITIVE
Protein, UA: POSITIVE — AB
Spec Grav, UA: 1.01 (ref 1.010–1.025)
Urobilinogen, UA: 0.2 E.U./dL
pH, UA: 7 (ref 5.0–8.0)

## 2019-01-09 NOTE — Progress Notes (Signed)
Patient brought in urine sample into the office. Urine culture was sent.

## 2019-01-12 ENCOUNTER — Telehealth: Payer: Self-pay

## 2019-01-12 ENCOUNTER — Other Ambulatory Visit: Payer: Self-pay | Admitting: Family Medicine

## 2019-01-12 DIAGNOSIS — N3090 Cystitis, unspecified without hematuria: Secondary | ICD-10-CM

## 2019-01-12 DIAGNOSIS — N390 Urinary tract infection, site not specified: Secondary | ICD-10-CM

## 2019-01-12 LAB — URINE CULTURE

## 2019-01-12 MED ORDER — AMOXICILLIN-POT CLAVULANATE 875-125 MG PO TABS: 1.0000 | ORAL_TABLET | Freq: Two times a day (BID) | ORAL | 0 refills | Status: DC

## 2019-01-12 NOTE — Telephone Encounter (Signed)
LMTCB ED 

## 2019-01-12 NOTE — Telephone Encounter (Signed)
-----   Message from Margo Common, [place] sent at 01/12/2019 10:35 AM EDT ----- Sensitivity report confirms a penicillin should help. Will send antibiotic to the [facility]. Recheck urinalysis for cure in 10 days. May use the AZO-Standard prn discomfort.

## 2019-01-14 NOTE — Telephone Encounter (Signed)
Spoke to pt and advised information.  Pt will call after finishing antibiotics to schedule for recheck of UA

## 2019-01-15 ENCOUNTER — Other Ambulatory Visit: Payer: Self-pay

## 2019-01-15 ENCOUNTER — Encounter: Payer: Self-pay | Admitting: Family Medicine

## 2019-01-15 DIAGNOSIS — M0579 Rheumatoid arthritis with rheumatoid factor of multiple sites without organ or systems involvement: Secondary | ICD-10-CM

## 2019-01-15 MED ORDER — HYDROCODONE-ACETAMINOPHEN 7.5-325 MG PO TABS: 1.0000 | ORAL_TABLET | Freq: Four times a day (QID) | ORAL | 0 refills | Status: DC | PRN

## 2019-01-15 NOTE — Telephone Encounter (Signed)
Patient sent a mychart message requesting a refill.

## 2019-01-16 ENCOUNTER — Encounter: Payer: Self-pay | Admitting: Urology

## 2019-01-19 ENCOUNTER — Encounter: Payer: Self-pay | Admitting: Urology

## 2019-01-26 ENCOUNTER — Encounter: Payer: Self-pay | Admitting: Family Medicine

## 2019-01-30 ENCOUNTER — Other Ambulatory Visit (INDEPENDENT_AMBULATORY_CARE_PROVIDER_SITE_OTHER): Payer: Medicare HMO

## 2019-01-30 DIAGNOSIS — N390 Urinary tract infection, site not specified: Secondary | ICD-10-CM

## 2019-01-30 LAB — POCT URINALYSIS DIPSTICK
Bilirubin, UA: NEGATIVE
Glucose, UA: NEGATIVE
Ketones, UA: NEGATIVE
Spec Grav, UA: >=1.03 — AB (ref 1.010–1.025)
Urobilinogen, UA: 0.2 E.U./dL
pH, UA: 6.5 (ref 5.0–8.0)

## 2019-01-30 NOTE — Progress Notes (Signed)
Urine culture ok per Simona Huh.

## 2019-02-01 LAB — URINE CULTURE

## 2019-02-03 ENCOUNTER — Encounter: Payer: Self-pay | Admitting: Family Medicine

## 2019-02-04 ENCOUNTER — Telehealth: Payer: Self-pay | Admitting: *Deleted

## 2019-02-04 ENCOUNTER — Other Ambulatory Visit: Payer: Self-pay | Admitting: Family Medicine

## 2019-02-04 DIAGNOSIS — M0579 Rheumatoid arthritis with rheumatoid factor of multiple sites without organ or systems involvement: Secondary | ICD-10-CM

## 2019-02-04 NOTE — Telephone Encounter (Signed)
No answer and no vm. Will try again later.  

## 2019-02-04 NOTE — Telephone Encounter (Signed)
-----   Message from Margo Common, [place] sent at 02/03/2019  8:15 AM EDT ----- No significant bacterial growth on culture. Continue to drink plenty of fluids and may use Cranberry capsules. Recheck as needed or scheduled with urologist.

## 2019-02-04 NOTE — Telephone Encounter (Signed)
Please review

## 2019-02-05 NOTE — Telephone Encounter (Signed)
Left message for patient to call back regarding lab results.

## 2019-02-08 ENCOUNTER — Encounter: Payer: Self-pay | Admitting: Family Medicine

## 2019-02-09 ENCOUNTER — Other Ambulatory Visit: Payer: Self-pay | Admitting: Family Medicine

## 2019-02-09 DIAGNOSIS — M0579 Rheumatoid arthritis with rheumatoid factor of multiple sites without organ or systems involvement: Secondary | ICD-10-CM

## 2019-02-09 MED ORDER — HYDROCODONE-ACETAMINOPHEN 7.5-325 MG PO TABS: 1.0000 | ORAL_TABLET | Freq: Four times a day (QID) | ORAL | 0 refills | Status: DC | PRN

## 2019-02-12 ENCOUNTER — Encounter: Payer: Self-pay | Admitting: Family Medicine

## 2019-02-13 ENCOUNTER — Other Ambulatory Visit: Payer: Self-pay

## 2019-02-13 DIAGNOSIS — N3090 Cystitis, unspecified without hematuria: Secondary | ICD-10-CM

## 2019-02-16 ENCOUNTER — Encounter: Payer: Self-pay | Admitting: Urology

## 2019-02-16 ENCOUNTER — Encounter: Payer: Self-pay | Admitting: Family Medicine

## 2019-02-16 DIAGNOSIS — N3090 Cystitis, unspecified without hematuria: Secondary | ICD-10-CM | POA: Diagnosis not present

## 2019-02-18 ENCOUNTER — Telehealth: Payer: Self-pay | Admitting: Family Medicine

## 2019-02-18 NOTE — Telephone Encounter (Signed)
Please review for [name].    Thanks,   -Zhyon Antenucci  

## 2019-02-18 NOTE — Telephone Encounter (Signed)
Pt advised.   Thanks,   -Laura  

## 2019-02-18 NOTE — Telephone Encounter (Signed)
Pt called wanting to know if we have received her UC back   She states that she is still in some pain and noticing some blood in her urine.  CB# [phone]  Thanks Con Memos

## 2019-02-18 NOTE — Telephone Encounter (Signed)
Not done yet.

## 2019-02-19 ENCOUNTER — Other Ambulatory Visit: Payer: Self-pay | Admitting: Family Medicine

## 2019-02-19 DIAGNOSIS — R300 Dysuria: Secondary | ICD-10-CM

## 2019-02-19 LAB — SPECIMEN STATUS REPORT

## 2019-02-19 LAB — URINE CULTURE

## 2019-02-19 MED ORDER — CIPROFLOXACIN HCL 500 MG PO TABS: 500.0000 mg | ORAL_TABLET | Freq: Two times a day (BID) | ORAL | 0 refills | Status: DC

## 2019-02-23 ENCOUNTER — Other Ambulatory Visit: Payer: Self-pay | Admitting: Family Medicine

## 2019-02-23 DIAGNOSIS — Z9289 Personal history of other medical treatment: Secondary | ICD-10-CM

## 2019-02-25 ENCOUNTER — Encounter: Payer: Medicare HMO | Attending: Internal Medicine | Admitting: Internal Medicine

## 2019-02-25 ENCOUNTER — Other Ambulatory Visit: Payer: Self-pay

## 2019-02-25 ENCOUNTER — Other Ambulatory Visit: Payer: Self-pay | Admitting: Family Medicine

## 2019-02-25 ENCOUNTER — Telehealth: Payer: Self-pay | Admitting: Urology

## 2019-02-25 DIAGNOSIS — Z8541 Personal history of malignant neoplasm of cervix uteri: Secondary | ICD-10-CM | POA: Diagnosis not present

## 2019-02-25 DIAGNOSIS — I10 Essential (primary) hypertension: Secondary | ICD-10-CM | POA: Insufficient documentation

## 2019-02-25 DIAGNOSIS — D069 Carcinoma in situ of cervix, unspecified: Secondary | ICD-10-CM | POA: Insufficient documentation

## 2019-02-25 DIAGNOSIS — N3041 Irradiation cystitis with hematuria: Secondary | ICD-10-CM | POA: Diagnosis not present

## 2019-02-25 DIAGNOSIS — Z87442 Personal history of urinary calculi: Secondary | ICD-10-CM | POA: Insufficient documentation

## 2019-02-25 DIAGNOSIS — M069 Rheumatoid arthritis, unspecified: Secondary | ICD-10-CM | POA: Insufficient documentation

## 2019-02-25 DIAGNOSIS — Z87891 Personal history of nicotine dependence: Secondary | ICD-10-CM | POA: Insufficient documentation

## 2019-02-25 DIAGNOSIS — Z923 Personal history of irradiation: Secondary | ICD-10-CM | POA: Insufficient documentation

## 2019-02-25 DIAGNOSIS — N3040 Irradiation cystitis without hematuria: Secondary | ICD-10-CM | POA: Diagnosis present

## 2019-02-25 NOTE — Telephone Encounter (Signed)
Dr Linton Ham needs Cystoscopy notes and a note showing that the pt has Radiation Cystitis. Pt is coming in today for her appt @ 3:00pm. Please advise. Also there was 2 referrals placed,can someone remove the one that was not assigned to the appt.

## 2019-02-25 NOTE — Telephone Encounter (Signed)
Already addressed

## 2019-02-26 NOTE — Progress Notes (Signed)
JESSI, PITSTICK ([id number]) Visit Report for 02/25/2019 Chief Complaint Document Details Patient Name: Jeanne [name], Jeanne [name]. Date of Service: 02/25/2019 3:15 PM Medical Record Number: [id number] Patient Account Number: [id number] Date of Birth/Sex: November 03, 1950 (68 y.o. Female) Treating RN: Cornell Barman Primary Care Provider: [name] Other Clinician: Referring Provider: Hollice Espy Treating Provider/Extender: Tito Dine in Treatment: 0 Information Obtained from: Patient Chief Complaint 02/25/2019; patient is here for review of consideration of hyperbaric oxygen for radiation cystitis Electronic Signature(s) Signed: 02/25/2019 6:03:55 PM By: Linton Ham MD Entered By: Linton Ham on 02/25/2019 17:14:53 Dunlap, Yaquelin R. ([id number]) -------------------------------------------------------------------------------- HPI Details Patient Name: Jeanne Hind R. Date of Service: 02/25/2019 3:15 PM Medical Record Number: [id number] Patient Account Number: [id number] Date of Birth/Sex: 1951/02/09 (68 y.o. Female) Treating RN: Cornell Barman Primary Care Provider: [name] Other Clinician: Referring Provider: Hollice Espy Treating Provider/Extender: Tito Dine in Treatment: 0 History of Present Illness HPI Description: ADMISSION 02/25/2019 Patient referred by urology Dr. Erlene Quan for consideration of hyperbaric oxygen therapy for radiation cystitis This is a 68 year old woman who was diagnosed with stage IoB, 1 adenocarcinoma of the cervix in 2004. Went a radical hysterectomy and bilateral salpingo-oophorectomy with bilateral pelvic and aortic lymphadenectomy on 09/09/2003. In surveillance follow-up on 8/305 she was found to have a vaginal lesion and was diagnosed with recurrent carcinoma. The patient completed 45 Gy external beam whole pelvic radiation followed bySYED implant 20 GY between 11/2 and 07/14/2004. Noted looking through her records at  [facility] and follow-up with her gynecologic oncologist Dr. Roosevelt Locks she had already had episodes of hematuria. Noted that she had a cystoscopy at the end of 2013 which showed radiation cystitis in the bladder but she had not had any recurrent or recent episodes of gross him hematuria. She was seen by Dr. Rolanda Lundborg of urology also at [facility] on 12/06/2010. Noted at that time to have intermittent gross hematuria. She passes clots and is able to urinate at that time. She had a cystoscopy at that point there was evidence of radiation changes to the bladder but no evidence of malignancy. It was felt that she had intermittent gross hematuria likely secondary to prior radiation. The patient states her real problems began 3 years ago. She woke up one morning and was unable to void. She went to the ER and passed multiple blood clots. Since then she has had intermittent gross hematuria with episodic voiding difficulties. She passes blood clots very frequently. She is also developed recurrent bladder infections for which she has had multiple antibiotics. Symptoms of UTIs include urinary urgency frequency and foul-smelling urine. She also has a history of nephrolithiasis. The patient had cystoscopy in November 2019 showing vaginal stenosis, bladder had findings consistent with radiation cystitis she had a urethral stricture and a large left-sided stone. Hyperbarics was recommended that time for the radiation cystitis. Over the last 3 weeks the patient has been absolutely miserable with frequent blood clots, dysuria, increased urinary frequency and urgency with episodes of incontinence. She currently is on ciprofloxacin. 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 Recurrent UTIs, nephrolithiasis, total hip replacement x2 total knee replacements, severe arthritis. Electronic Signature(s) Signed: 02/25/2019 6:03:55 PM By: Linton Ham MD Entered By: Linton Ham on 02/25/2019  17:30:13 Tingler, Derek Jack ([id number]) -------------------------------------------------------------------------------- Physical Exam Details Patient Name: [name], [name] Date of Service: 02/25/2019 3:15 PM Medical Record Number: [id number] Patient Account Number: [id number] Date of Birth/Sex: 07-25-1951 (68 y.o. Female) Treating RN: Cornell Barman Primary Care Provider:  Chrismon, Simona Huh Other Clinician: Referring Provider: Hollice Espy Treating Provider/Extender: Tito Dine in Treatment: 0 Constitutional Patient is hypertensive.. Pulse regular and within target range for patient.Marland Kitchen Respirations regular, non-labored and within target range.. Temperature is normal and within the target range for the patient.Marland Kitchen appears in no distress. Respiratory Respiratory effort is easy and symmetric bilaterally. Rate is normal at rest and on room air.. Bilateral breath sounds are clear and equal in all lobes with no wheezes, rales or rhonchi.. Cardiovascular Heart rhythm and rate regular, without murmur or gallop.. Gastrointestinal (GI) Abdomen is soft and non-distended without masses or tenderness. Bowel sounds active in all quadrants.. No liver or spleen enlargement or tenderness.. Genitourinary (GU) Bladder without fullness, masses or tenderness.. Lymphatic None palpable in the inguinal area. Psychiatric Patient appears depressed today.. Electronic Signature(s) Signed: 02/25/2019 6:03:55 PM By: Linton Ham MD Entered By: Linton Ham on 02/25/2019 17:31:15 Macomber, Derek Jack ([id number]) -------------------------------------------------------------------------------- Physician Orders Details Patient Name: Jeanne Hind R. Date of Service: 02/25/2019 3:15 PM Medical Record Number: [id number] Patient Account Number: [id number] Date of Birth/Sex: 06-20-1951 (68 y.o. 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, unspecified Follow-up Appointments o Other: - Will set up once we have HBO insurance approval Hyperbaric Oxygen Therapy o Evaluate for HBO Therapy o Indication: - Radiation Cystitis o If appropriate for treatment, begin HBOT per protocol: o 2.0 ATA for 90 Minutes without Air Breaks o One treatment per day (delivered Monday through Friday unless otherwise specified in Special Instructions below): o Total # of Treatments: - 40 Radiology o X-ray, Chest - HBO clearance Electronic Signature(s) Signed: 02/25/2019 6:03:55 PM By: Linton Ham MD Previous Signature: 02/25/2019 5:19:38 PM Version By: Gretta Cool, BSN, RN, CWS, Kim RN, BSN Entered By: Linton Ham on 02/25/2019 17:31:42 Clauson, Derek Jack ([id number]) -------------------------------------------------------------------------------- Problem List Details Patient Name: Jeanne Hind R. Date of Service: 02/25/2019 3:15 PM Medical Record Number: [id number] Patient Account Number: [id number] Date of Birth/Sex: 11-27-1950 (68 y.o. 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 ([id number]) -------------------------------------------------------------------------------- Progress Note Details Patient Name: Jeanne Hind R. Date of Service: 02/25/2019 3:15 PM Medical Record Number: [id number] Patient Account Number: [id number] Date of Birth/Sex: [birth year] (68 y.o. Female) Treating RN: Cornell Barman Primary Care Provider: [name] Other Clinician: Referring Provider: Hollice Espy Treating Provider/Extender: Tito Dine in Treatment: 0 Subjective Chief Complaint Information obtained from Patient 02/25/2019; patient is here for review of consideration of hyperbaric oxygen for radiation cystitis History of Present Illness (HPI) ADMISSION 02/25/2019 Patient referred by urology Dr. Erlene Quan for consideration of hyperbaric oxygen therapy for radiation cystitis This is a 68 year old woman who was diagnosed with stage I B, 1 adenocarcinoma of the cervix in 2004. Went a radical hysterectomy and bilateral salpingo-oophorectomy with bilateral pelvic and aortic lymphadenectomy on 09/09/2003.  In surveillance follow-up on 8/305 she was found to have a vaginal lesion and was diagnosed with recurrent carcinoma. The patient completed 45 Gy external beam whole pelvic radiation followed bySYED implant 20 GY between 11/2 and 07/14/2004. Noted looking through her records at [facility] and follow-up with her gynecologic oncologist Dr. Roosevelt Locks she had already had episodes of hematuria. Noted that she had a cystoscopy at the end of 2013 which showed radiation cystitis in the bladder but she had not had any recurrent or recent episodes of gross him hematuria. She was seen by Dr. Rolanda Lundborg of urology also at [facility] on 12/06/2010. Noted at that time to have intermittent gross hematuria. She passes clots and is able to urinate at that time. She had a cystoscopy at that point there was evidence of radiation changes to the bladder but no evidence  of malignancy. It was felt that she had intermittent gross hematuria likely secondary to prior radiation. The patient states her real problems began 3 years ago. She woke up one morning and was unable to void. She went to the ER and passed multiple blood clots. Since then she has had intermittent gross hematuria with episodic voiding difficulties. She passes blood clots very frequently. She is also developed recurrent bladder infections for which she has had multiple antibiotics. Symptoms of UTIs include urinary urgency frequency and foul-smelling urine. She also has a history of nephrolithiasis. The patient had cystoscopy in November 2019 showing vaginal stenosis, bladder had findings consistent with radiation cystitis she had a urethral stricture and a large left-sided stone. Hyperbarics was recommended that time for the radiation cystitis. Over the last 3 weeks the patient has been absolutely miserable with frequent blood clots, dysuria, increased urinary frequency and urgency with episodes of incontinence. She currently is on ciprofloxacin. 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 Recurrent UTIs, nephrolithiasis, total hip replacement x2 total knee replacements, severe arthritis. Patient History Information obtained from Patient. Allergies codeine (Severity: Mild, Reaction: nausea), sulfa Family History Cancer - Mother, Heart Disease - Father, Hypertension - Father, No family history of Diabetes, Hereditary Spherocytosis, Kidney Disease, Lung Disease, Seizures, Stroke, Thyroid Problems, Smolen, Deniesha R. ([id number]) Tuberculosis. Social History Former smoker - stopped 1990, Marital Status - Married, Alcohol Use - Never, Drug Use - No History, Caffeine Use - Moderate - iced tea. Medical History Eyes Patient has history of Cataracts Ear/Nose/Mouth/Throat Denies history of Chronic sinus problems/congestion, Middle ear  problems Hematologic/Lymphatic Denies history of Anemia, Hemophilia, Human Immunodeficiency Virus, Lymphedema, Sickle Cell Disease Respiratory Denies history of Aspiration, Asthma, Chronic Obstructive Pulmonary Disease (COPD), Pneumothorax, Sleep Apnea, Tuberculosis Cardiovascular Patient has history of Hypertension Denies history of Angina, Arrhythmia, Congestive Heart Failure, Coronary Artery Disease, Deep Vein Thrombosis, Hypotension, Myocardial Infarction, Peripheral Arterial Disease, Peripheral Venous Disease, Phlebitis, Vasculitis Gastrointestinal Denies history of Cirrhosis , Colitis, Crohn s, Hepatitis A, Hepatitis B, Hepatitis C Endocrine Denies history of Type I Diabetes, Type II Diabetes Immunological Denies history of Lupus Erythematosus, Raynaud s, Scleroderma Integumentary (Skin) Denies history of History of Burn, History of pressure wounds Musculoskeletal Patient has history of Rheumatoid Arthritis Denies history of Gout, Osteoarthritis, Osteomyelitis Neurologic Denies history of Dementia, Neuropathy, Quadriplegia, Paraplegia, Seizure Disorder Oncologic Patient has history of Received Chemotherapy - 2005, Received Radiation - 2005 Psychiatric Denies history of Anorexia/bulimia, Confinement Anxiety Hospitalization/Surgery History - joint replacement total hip left-1981, right-1972, knee right-1990. Medical And Surgical History Notes Genitourinary Occasional Kidney stone Oncologic Cervical Cancer Review of Systems (ROS) Constitutional Symptoms (General Health) Denies  complaints or symptoms of Fatigue, Fever, Chills, Marked Weight Change. Eyes Complains or has symptoms of Glasses / Contacts - reading. Denies complaints or symptoms of Dry Eyes, Vision Changes. Ear/Nose/Mouth/Throat Denies complaints or symptoms of Difficult clearing ears, Sinusitis. Hematologic/Lymphatic Denies complaints or symptoms of Bleeding / Clotting Disorders, Human Immunodeficiency  Virus. Respiratory Denies complaints or symptoms of Chronic or frequent coughs, Shortness of Breath. Cardiovascular Denies complaints or symptoms of Chest pain, LE edema. Crespin, [place] ([id number]) Gastrointestinal Denies complaints or symptoms of Frequent diarrhea, Nausea, Vomiting. Endocrine Denies complaints or symptoms of Hepatitis, Thyroid disease, Polydypsia (Excessive Thirst). Genitourinary Denies complaints or symptoms of Kidney failure/ Dialysis, Incontinence/dribbling. Immunological Denies complaints or symptoms of Hives, Itching. Integumentary (Skin) Denies complaints or symptoms of Wounds, Bleeding or bruising tendency, Breakdown, Swelling. Neurologic Denies complaints or symptoms of Numbness/parasthesias, Focal/Weakness. Psychiatric Denies complaints or symptoms of Anxiety, Claustrophobia. Objective Constitutional Patient is hypertensive.. Pulse regular and within target range for patient.Marland Kitchen Respirations regular, non-labored and within target range.. Temperature is normal and within the target range for the patient.Marland Kitchen appears in no distress. Vitals Time Taken: 3:20 PM, Height: 64 in, Source: Stated, Weight: 152 lbs, Source: Stated, BMI: 26.1, Temperature: 97.9 F, Pulse: 73 bpm, Respiratory Rate: 16 breaths/min, Blood Pressure: 158/65 mmHg. Respiratory Respiratory effort is easy and symmetric bilaterally. Rate is normal at rest and on room air.. Bilateral breath sounds are clear and equal in all lobes with no wheezes, rales or rhonchi.. Cardiovascular Heart rhythm and rate regular, without murmur or gallop.. Gastrointestinal (GI) Abdomen is soft and non-distended without masses or tenderness. Bowel sounds active in all quadrants.. No liver or spleen enlargement or tenderness.. Genitourinary (GU) Bladder without fullness, masses or tenderness.. Lymphatic None palpable in the inguinal area. Psychiatric Patient appears depressed today.Marland Kitchen BRUNELLA, WILEMAN  ([id number]) Assessment Active Problems ICD-10 Irradiation cystitis with hematuria Personal history of irradiation Carcinoma in situ of cervix, unspecified Plan Follow-up Appointments: Other: - Will set up once we have HBO insurance approval Hyperbaric Oxygen Therapy: Evaluate for HBO Therapy Indication: - Radiation Cystitis If appropriate for treatment, begin HBOT per protocol: 2.0 ATA for 90 Minutes without Air Breaks One treatment per day (delivered Monday through Friday unless otherwise specified in Special Instructions below): Total # of Treatments: - 40 Radiology ordered were: X-ray, Chest - HBO clearance 1. Patient with a longstanding history of radiation cystitis first documented in the record in 2012. Her most recent cystoscopy was in November 2019 which also showed cystoscopic findings and bladder consistent with radiation cystitis. She has had an absolutely miserable time over the last 3 weeks with recurrent bleeding and blood clots, voiding difficulties including frequency urgency some incontinence. She also has very significant pelvic pain. She also has a history of vaginal stenosis, urethral stenosis and nephrolithiasis. It is somewhat difficult to sort out how much of her symptomatology is secondary to the radiation cystitis however I think hyperbaric oxygen is indicated in this patient. We will be monitoring the frequency of the hematuria and blood clots as well as ancillary symptoms such as urinary frequency and urgency. 2. We have prescribed 40 treatments at 2 atm. We have talked to the patient and answered all of her questions. Showed her the hyperbaric chambers. 3. She will need a chest x-ray. She is an ex-smoker quitting in the 1990s. At than that I do not think she needs any additional testing Electronic Signature(s) Signed: 02/25/2019 6:03:55 PM By: Linton Ham MD Entered By: Linton Ham on 02/25/2019 17:40:53 Spinello, [place]  (  [id number]) -------------------------------------------------------------------------------- ROS/PFSH Details Patient Name: NICO, ROGNESS. Date of Service: 02/25/2019 3:15 PM Medical Record Number: [id number] Patient Account Number: [id number] Date of Birth/Sex: 13-Dec-1950 (68 y.o. Female) Treating RN: Harold Barban Primary Care Provider: [name] Other Clinician: Referring Provider: Hollice Espy Treating Provider/Extender: Tito Dine in Treatment: 0 Information Obtained From Patient Constitutional Symptoms (General Health) Complaints and Symptoms: Negative for: Fatigue; Fever; Chills; Marked Weight Change Eyes Complaints and Symptoms: Positive for: Glasses / Contacts - reading Negative for: Dry Eyes; Vision Changes Medical History: Positive for: Cataracts Ear/Nose/Mouth/Throat Complaints and Symptoms: Negative for: Difficult clearing ears; Sinusitis Medical History: Negative for: Chronic sinus problems/congestion; Middle ear problems Hematologic/Lymphatic Complaints and Symptoms: Negative for: Bleeding / Clotting Disorders; Human Immunodeficiency Virus Medical History: Negative for: Anemia; Hemophilia; Human Immunodeficiency Virus; Lymphedema; Sickle Cell Disease Respiratory Complaints and Symptoms: Negative for: Chronic or frequent coughs; Shortness of Breath Medical History: Negative for: Aspiration; Asthma; Chronic Obstructive Pulmonary Disease (COPD); Pneumothorax; Sleep Apnea; Tuberculosis Cardiovascular Complaints and Symptoms: Negative for: Chest pain; LE edema Medical History: Positive for: Hypertension Negative for: Angina; Arrhythmia; Congestive Heart Failure; Coronary Artery Disease; Deep Vein Thrombosis; Hypotension; Ruedas, Tewana R. ([id number]) Myocardial Infarction; Peripheral Arterial Disease; Peripheral Venous Disease; Phlebitis; Vasculitis Gastrointestinal Complaints and Symptoms: Negative for: Frequent diarrhea; Nausea;  Vomiting Medical History: Negative for: Cirrhosis ; Colitis; Crohnos; Hepatitis A; Hepatitis B; Hepatitis C Endocrine Complaints and Symptoms: Negative for: Hepatitis; Thyroid disease; Polydypsia (Excessive Thirst) Medical History: Negative for: Type I Diabetes; Type II Diabetes Genitourinary Complaints and Symptoms: Negative for: Kidney failure/ Dialysis; Incontinence/dribbling Medical History: Past Medical History Notes: Occasional Kidney stone Immunological Complaints and Symptoms: Negative for: Hives; Itching Medical History: Negative for: Lupus Erythematosus; Raynaudos; Scleroderma Integumentary (Skin) Complaints and Symptoms: Negative for: Wounds; Bleeding or bruising tendency; Breakdown; Swelling Medical History: Negative for: History of Burn; History of pressure wounds Neurologic Complaints and Symptoms: Negative for: Numbness/parasthesias; Focal/Weakness Medical History: Negative for: Dementia; Neuropathy; Quadriplegia; Paraplegia; Seizure Disorder Psychiatric Complaints and Symptoms: Negative for: Anxiety; Claustrophobia Medical History: Negative for: Anorexia/bulimia; Confinement Anxiety Groot, Victora R. ([id number]) Musculoskeletal Medical History: Positive for: Rheumatoid Arthritis Negative for: Gout; Osteoarthritis; Osteomyelitis Oncologic Medical History: Positive for: Received Chemotherapy - 2005; Received Radiation - 2005 Past Medical History Notes: Cervical Cancer HBO Extended History Items Eyes: Cataracts Immunizations Pneumococcal Vaccine: Received Pneumococcal Vaccination: No Implantable Devices None Hospitalization / Surgery History Type of Hospitalization/Surgery joint replacement total hip left-1981, right-1972, knee right-1990 Family and Social History Cancer: Yes - Mother; Diabetes: No; Heart Disease: Yes - Father; Hereditary Spherocytosis: No; Hypertension: Yes - Father; Kidney Disease: No; Lung Disease: No; Seizures: No; Stroke:  No; Thyroid Problems: No; Tuberculosis: No; Former smoker - stopped 69; Marital Status - Married; Alcohol Use: Never; Drug Use: No History; Caffeine Use: Moderate - iced tea; Financial Concerns: No; Food, Clothing or Shelter Needs: No; Support System Lacking: No; Transportation Concerns: No Electronic Signature(s) Signed: 02/25/2019 4:39:36 PM By: Harold Barban Signed: 02/25/2019 6:03:55 PM By: Linton Ham MD Entered By: Harold Barban on 02/25/2019 15:33:28 Fritzsche, Myrene Alfonso Patten ([id number]) -------------------------------------------------------------------------------- SuperBill Details Patient Name: Jeanne Hind R. Date of Service: 02/25/2019 Medical Record Number: [id number] Patient Account Number: [id number] Date of Birth/Sex: 1951/02/16 (68 y.o. 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

## 2019-02-26 NOTE — Progress Notes (Signed)
GEAN, LAURSEN ([id number]) Visit Report for 02/25/2019 HBO Risk Assessment Details Patient Name: Jeanne [name], Jeanne [name]. Date of Service: 02/25/2019 3:15 PM Medical Record Number: [id number] Patient Account Number: [id number] Date of Birth/Sex: 1951-08-01 (68 y.o. Female) Treating RN: Cornell Barman Primary Care Zaleah Ternes: [name] Other Clinician: Referring Marlinda Miranda: Hollice Espy Treating Ruhani Umland/Extender: Tito Dine in Treatment: 0 HBO Risk Assessment Items Answer Any "Yes" answers must be brought to the hyperbaric physician's attention. Breathing or Lung problemso No Currently use tobacco productso No Used tobacco products in the pasto Yes Heart problemso No Do you take water pills (diuretic)o No Diabeteso No Dialysiso No Eye problems like glaucomao No Ear problems or surgeryo No Sinus Problemso No Cancero Yes List the location: cervical Surgery(s) for cancero Yes Describe the surgery (s): hysterectomy Radiation therapy for cancero Yes Last treatment for radiation therapyo 2005 Chemotherapy for cancero Yes Last treatment for chemotherapyo 2005 Confinement Anxiety (Claustrophobia- fear of confined places)o No Any medical implants/devices that are fully or partially implanted or attached to your bodyo No Pregnanto No Seizureso No Electronic Signature(s) Signed: 02/26/2019 4:58:07 PM By: Gretta Cool, BSN, RN, CWS, Kim RN, BSN Entered By: Gretta Cool, BSN, RN, CWS, Kim on 02/25/2019 16:14:23

## 2019-02-26 NOTE — Progress Notes (Signed)
KEARAH, GAYDEN ([id number]) Visit Report for 02/25/2019 Abuse/Suicide Risk Screen Details Patient Name: Jeanne [name], Jeanne [name]. Date of Service: 02/25/2019 3:15 PM Medical Record Number: [id number] Patient Account Number: [id number] Date of Birth/Sex: 08/29/1951 (68 y.o. Female) Treating RN: Harold Barban Primary Care Earlene Bjelland: [name] Other Clinician: Referring Samiyah Stupka: Hollice Espy Treating Channa Hazelett/Extender: Tito Dine in Treatment: 0 Abuse/Suicide Risk Screen Items Answer ABUSE RISK SCREEN: Has anyone close to you tried to hurt or harm you recentlyo No Do you feel uncomfortable with anyone in your familyo No Has anyone forced you do things that you didnot want to doo No Electronic Signature(s) Signed: 02/25/2019 4:39:36 PM By: Harold Barban Entered By: Harold Barban on 02/25/2019 15:33:37 Jeanne [name], Jeanne [name]. ([id number]) -------------------------------------------------------------------------------- Activities of Daily Living Details Patient Name: Jeanne [name], Jeanne R. Date of Service: 02/25/2019 3:15 PM Medical Record Number: [id number] Patient Account Number: [id number] Date of Birth/Sex: 07-Sep-1951 (68 y.o. Female) Treating RN: Harold Barban Primary Care Story Vanvranken: [name] Other Clinician: Referring Latham Kinzler: Hollice Espy Treating Akira Perusse/Extender: Tito Dine in Treatment: 0 Activities of Daily Living Items Answer Activities of Daily Living (Please select one for each item) Drive Automobile Completely Able Take Medications Completely Able Use Telephone Completely Able Care for Appearance Completely Able Use Toilet Completely Able Bath / Shower Completely Able Dress Self Completely Able Feed Self Completely Able Walk Completely Able Get In / Out Bed Completely Able Housework Completely Able Prepare Meals Completely Able Handle Money Completely Able Shop for Self Completely Able Electronic Signature(s) Signed:  02/25/2019 4:39:36 PM By: Harold Barban Entered By: Harold Barban on 02/25/2019 15:33:50 Jeanne [name], Jeanne [name] ([id number]) -------------------------------------------------------------------------------- Education Screening Details Patient Name: Jeanne Hind R. Date of Service: 02/25/2019 3:15 PM Medical Record Number: [id number] Patient Account Number: [id number] Date of Birth/Sex: Mar 02, 1951 (68 y.o. Female) Treating RN: Harold Barban Primary Care Leslie Langille: [name] Other Clinician: Referring Floy Angert: Hollice Espy Treating Shepherd Finnan/Extender: Tito Dine in Treatment: 0 Primary Learner Assessed: Patient Learning Preferences/Education Level/Primary Language Learning Preference: Explanation Highest Education Level: College or Above Preferred Language: English Cognitive Barrier Language Barrier: No Translator Needed: No Memory Deficit: No Emotional Barrier: No Cultural/Religious Beliefs Affecting Medical Care: No Physical Barrier Impaired Vision: No Impaired Hearing: No Decreased Hand dexterity: No Knowledge/Comprehension Knowledge Level: High Comprehension Level: High Ability to understand written High instructions: Ability to understand verbal High instructions: Motivation Anxiety Level: Calm Cooperation: Cooperative Education Importance: Acknowledges Need Interest in Health Problems: Asks Questions Perception: Coherent Willingness to Engage in Self- High Management Activities: Readiness to Engage in Self- High Management Activities: Electronic Signature(s) Signed: 02/25/2019 4:39:36 PM By: Harold Barban Entered By: Harold Barban on 02/25/2019 15:34:20 Jeanne [name], Jeanne [name] ([id number]) -------------------------------------------------------------------------------- Fall Risk Assessment Details Patient Name: Jeanne [name], Jeanne R. Date of Service: 02/25/2019 3:15 PM Medical Record Number: [id number] Patient Account Number: [id number] Date  of Birth/Sex: 1950/09/12 (68 y.o. Female) Treating RN: Harold Barban Primary Care Nesa Distel: [name] Other Clinician: Referring Leshon Armistead: Hollice Espy Treating Danne Scardina/Extender: Tito Dine in Treatment: 0 Fall Risk Assessment Items Have you had 2 or more falls in the last 12 monthso 0 No Have you had any fall that resulted in injury in the last 12 monthso 0 No FALLS RISK SCREEN History of falling - immediate or within 3 months 0 No Secondary diagnosis (Do you have 2 or more medical diagnoseso) 0 No Ambulatory aid None/bed rest/wheelchair/nurse 0 No Crutches/cane/walker 0 No Furniture 0 No Intravenous therapy Access/Saline/Heparin Lock 0 No Gait/Transferring Normal/ bed rest/ wheelchair 0 No Weak (  short steps with or without shuffle, stooped but able to lift head while 0 No walking, may seek support from furniture) Impaired (short steps with shuffle, may have difficulty arising from chair, head 0 No down, impaired balance) Mental Status Oriented to own ability 0 Yes Electronic Signature(s) Signed: 02/25/2019 4:39:36 PM By: Harold Barban Entered By: Harold Barban on 02/25/2019 15:34:34 Jeanne [name], Jeanne R. ([id number]) -------------------------------------------------------------------------------- Foot Assessment Details Patient Name: Jeanne Hind R. Date of Service: 02/25/2019 3:15 PM Medical Record Number: [id number] Patient Account Number: [id number] Date of Birth/Sex: [birth year] (68 y.o. Female) Treating RN: Harold Barban Primary Care Idali Lafever: [name] Other Clinician: Referring Cristiano Capri: Hollice Espy Treating Ashlynd Michna/Extender: Tito Dine in Treatment: 0 Foot Assessment Items Site Locations + = Sensation present, - = Sensation absent, C = Callus, U = Ulcer R = Redness, W = Warmth, M = Maceration, PU = Pre-ulcerative lesion F = Fissure, S = Swelling, D = Dryness Assessment Right: Left: Other Deformity: No  No Prior Foot Ulcer: No No Prior Amputation: No No Charcot Joint: No No Ambulatory Status: Ambulatory Without Help Gait: Steady Electronic Signature(s) Signed: 02/25/2019 4:39:36 PM By: Harold Barban Entered By: Harold Barban on 02/25/2019 15:34:59 Jeanne [name], Jeanne R. ([id number]) -------------------------------------------------------------------------------- Nutrition Risk Screening Details Patient Name: [name], Jeanne R. Date of Service: 02/25/2019 3:15 PM Medical Record Number: [id number] Patient Account Number: [id number] Date of Birth/Sex: 07/03/1951 (68 y.o. Female) Treating RN: Harold Barban Primary Care Jceon Alverio: [name] Other Clinician: Referring Rj Pedrosa: Hollice Espy Treating Amela Handley/Extender: Tito Dine in Treatment: 0 Height (in): 64 Weight (lbs): 152 Body Mass Index (BMI): 26.1 Nutrition Risk Screening Items Score Screening NUTRITION RISK SCREEN: I have an illness or condition that made me change the kind and/or amount of 0 No food I eat I eat fewer than two meals per day 0 No I eat few fruits and vegetables, or milk products 0 No I have three or more drinks of beer, liquor or wine almost every day 0 No I have tooth or mouth problems that make it hard for me to eat 0 No I don't always have enough money to buy the food I need 0 No I eat alone most of the time 0 No I take three or more different prescribed or over-the-counter drugs a day 1 Yes Without wanting to, I have lost or gained 10 pounds in the last six months 0 No I am not always physically able to shop, cook and/or feed myself 0 No Nutrition Protocols Good Risk Protocol Moderate Risk Protocol High Risk Proctocol Risk Level: Good Risk Score: 1 Electronic Signature(s) Signed: 02/25/2019 4:39:36 PM By: Harold Barban Entered By: Harold Barban on 02/25/2019 15:34:45

## 2019-02-26 NOTE — Progress Notes (Addendum)
Jeanne [name] ([id number]) Visit Report for 02/25/2019 Allergy List Details Patient Name: Jeanne [name], Jeanne [name]. Date of Service: 02/25/2019 3:15 PM Medical Record Number: [id number] Patient Account Number: [id number] Date of Birth/Sex: March 23, 1951 (68 y.o. F) Treating RN: Harold Barban Primary Care Terri Rorrer: [name] Other Clinician: Referring Latoya Maulding: Hollice Espy Treating Edmond Ginsberg/Extender: Ricard Dillon Weeks in Treatment: 0 Allergies Active Allergies codeine Reaction: nausea Severity: Mild sulfa Allergy Notes Electronic Signature(s) Signed: 02/25/2019 4:39:36 PM By: Harold Barban Entered By: Harold Barban on 02/25/2019 15:24:26 Plotts, Terriona Alfonso [name] ([id number]) -------------------------------------------------------------------------------- Arrival Information Details Patient Name: Jeanne Hind R. Date of Service: 02/25/2019 3:15 PM Medical Record Number: [id number] Patient Account Number: [id number] Date of Birth/Sex: 1951/01/09 (68 y.o. F) Treating RN: Harold Barban Primary Care Ishmail Mcmanamon: [name] Other Clinician: Referring Madhavi Hamblen: Hollice Espy Treating Darsha Zumstein/Extender: Tito Dine in Treatment: 0 Visit Information Patient Arrived: Ambulatory Arrival Time: 15:17 Accompanied By: self Transfer Assistance: None Patient Identification Verified: Yes Secondary Verification Process Completed: Yes Electronic Signature(s) Signed: 02/25/2019 4:39:36 PM By: Harold Barban Entered By: Harold Barban on 02/25/2019 15:17:53 Deardorff, Jeanne [name] ([id number]) -------------------------------------------------------------------------------- Clinic Level of Care Assessment Details Patient Name: [name], [name] Date of Service: 02/25/2019 3:15 PM Medical Record Number: [id number] Patient Account Number: [id number] Date of Birth/Sex: 1951-09-10 (68 y.o. F) Treating RN: Cornell Barman Primary Care Aryn Safran: [name] Other  Clinician: Referring Prateek Knipple: Hollice Espy Treating Darinda [place]/Extender: Tito Dine in Treatment: 0 Clinic Level of Care Assessment Items TOOL 2 Quantity Score []  - Use when only an EandM is performed on the INITIAL visit 0 ASSESSMENTS - Nursing Assessment / Reassessment X - General Physical Exam (combine w/ comprehensive assessment (listed just below) when 1 20 performed on new pt. evals) X- 1 25 Comprehensive Assessment (HX, ROS, Risk Assessments, Wounds Hx, etc.) ASSESSMENTS - Wound and Skin Assessment / Reassessment []  - Simple Wound Assessment / Reassessment - one wound 0 []  - 0 Complex Wound Assessment / Reassessment - multiple wounds []  - 0 Dermatologic / Skin Assessment (not related to wound area) ASSESSMENTS - Ostomy and/or Continence Assessment and Care []  - Incontinence Assessment and Management 0 []  - 0 Ostomy Care Assessment and Management (repouching, etc.) PROCESS - Coordination of Care []  - Simple Patient / Family Education for ongoing care 0 X- 1 20 Complex (extensive) Patient / Family Education for ongoing care X- 1 10 Staff obtains Programmer, systems, Records, Test Results / Process Orders []  - 0 Staff telephones HHA, Nursing Homes / Clarify orders / etc []  - 0 Routine Transfer to another Facility (non-emergent condition) []  - 0 Routine Hospital Admission (non-emergent condition) X- 1 15 New Admissions / Biomedical engineer / Ordering NPWT, Apligraf, etc. []  - 0 Emergency Hospital Admission (emergent condition) X- 1 10 Simple Discharge Coordination []  - 0 Complex (extensive) Discharge Coordination PROCESS - Special Needs []  - Pediatric / Minor Patient Management 0 []  - 0 Isolation Patient Management [name], [name] []  - 0 Hearing / Language / Visual special needs []  - 0 Assessment of Community assistance (transportation, D/C planning, etc.) []  - 0 Additional assistance / Altered mentation []  - 0 Support Surface(s)  Assessment (bed, cushion, seat, etc.) INTERVENTIONS - Wound Cleansing / Measurement []  - Wound Imaging (photographs - any number of wounds) 0 []  - 0 Wound Tracing (instead of photographs) []  - 0 Simple Wound Measurement - one wound []  - 0 Complex Wound Measurement - multiple wounds []  - 0 Simple Wound Cleansing - one wound []  - 0 Complex Wound  Cleansing - multiple wounds INTERVENTIONS - Wound Dressings []  - Small Wound Dressing one or multiple wounds 0 []  - 0 Medium Wound Dressing one or multiple wounds []  - 0 Large Wound Dressing one or multiple wounds []  - 0 Application of Medications - injection INTERVENTIONS - Miscellaneous []  - External ear exam 0 []  - 0 Specimen Collection (cultures, biopsies, blood, body fluids, etc.) []  - 0 Specimen(s) / Culture(s) sent or taken to Lab for analysis []  - 0 Patient Transfer (multiple staff / Civil Service fast streamer / Similar devices) []  - 0 Simple Staple / Suture removal (25 or less) []  - 0 Complex Staple / Suture removal (26 or more) []  - 0 Hypo / Hyperglycemic Management (close monitor of Blood Glucose) []  - 0 Ankle / Brachial Index (ABI) - do not check if billed separately Has the patient been seen at the hospital within the last three years: Yes Total Score: 100 Level Of Care: New/Established - Level 3 Electronic Signature(s) Signed: 02/26/2019 4:58:07 PM By: Gretta Cool, BSN, RN, CWS, Kim RN, BSN Entered By: Gretta Cool, BSN, RN, CWS, Kim on 02/25/2019 17:21:00 [name], [name] -------------------------------------------------------------------------------- Encounter Discharge Information Details Patient Name: Jeanne Hind R. Date of Service: 02/25/2019 3:15 PM Medical Record Number: [id number] Patient Account Number: [id number] Date of Birth/Sex: 1950/11/07 (68 y.o. F) Treating RN: Cornell Barman Primary Care Lamija Besse: [name] Other Clinician: Referring Laray Corbit: Hollice Espy Treating Babbette Dalesandro/Extender: Tito Dine in Treatment: 0 Encounter Discharge Information Items Discharge Condition: Stable Ambulatory Status: Ambulatory Discharge Destination: Home Transportation: Private Auto Accompanied By: self Schedule Follow-up Appointment: No Clinical Summary of Care: Electronic Signature(s) Signed: 02/25/2019 5:22:43 PM By: Gretta Cool, BSN, RN, CWS, Kim RN, BSN Entered By: Gretta Cool, BSN, RN, CWS, Kim on 02/25/2019 17:22:43 Radney, Jeanne [name] ([id number]) -------------------------------------------------------------------------------- General Visit Notes Details Patient Name: Jeanne Hind R. Date of Service: 02/25/2019 3:15 PM Medical Record Number: [id number] Patient Account Number: [id number] Date of Birth/Sex: [birth year] (68 y.o. F) Treating RN: Cornell Barman Primary Care Hildegarde Dunaway: [name] Other Clinician: Referring Talaysia Pinheiro: Hollice Espy Treating Delane Stalling/Extender: Tito Dine in Treatment: 0 Notes Insurance approval needed. Once approval is received and patient agrees to treatment we will need to get a chest x-ray for HBO clearance. Electronic Signature(s) Signed: 02/25/2019 5:25:17 PM By: Gretta Cool, BSN, RN, CWS, Kim RN, BSN Entered By: Gretta Cool, BSN, RN, CWS, Kim on 02/25/2019 17:25:17 Critz, Jeanne [name] ([id number]) -------------------------------------------------------------------------------- Lower Extremity Assessment Details Patient Name: Jeanne [name], Jeanne R. Date of Service: 02/25/2019 3:15 PM Medical Record Number: [id number] Patient Account Number: [id number] Date of Birth/Sex: Feb 13, 1951 (68 y.o. F) Treating RN: Harold Barban Primary Care Rorey Bisson: [name] Other Clinician: Referring Zareena Willis: Hollice Espy Treating Omar Orrego/Extender: Tito Dine in Treatment: 0 Electronic Signature(s) Signed: 02/25/2019 4:39:36 PM By: Harold Barban Entered By: Harold Barban on 02/25/2019 15:22:42 Loschiavo, Jeanne [name]  ([id number]) -------------------------------------------------------------------------------- Lawrence Details Patient Name: Jeanne [name], Jeanne R. Date of Service: 02/25/2019 3:15 PM Medical Record Number: [id number] Patient Account Number: [id number] Date of Birth/Sex: 05/10/1951 (68 y.o. F) Treating RN: Cornell Barman Primary Care Takeysha Bonk: [name] Other Clinician: Referring Cristhian Vanhook: Hollice Espy Treating Jasia Hiltunen/Extender: Tito Dine in Treatment: 0 Active Inactive Electronic Signature(s) Signed: 03/10/2019 10:27:56 AM By: Gretta Cool, BSN, RN, CWS, Kim RN, BSN Previous Signature: 03/10/2019 10:27:10 AM Version By: Gretta Cool, BSN, RN, CWS, Kim RN, BSN Previous Signature: 02/25/2019 5:10:35 PM Version By: Gretta Cool, BSN, RN, CWS, Kim RN, BSN Entered By: Gretta Cool, BSN, RN, CWS, Kim on 03/10/2019 10:27:55 Wirtanen, Jeanne R. ([id number]) --------------------------------------------------------------------------------  Pain Assessment Details Patient Name: Jeanne [name], LAINEZ. Date of Service: 02/25/2019 3:15 PM Medical Record Number: [id number] Patient Account Number: [id number] Date of Birth/Sex: 10-Jun-1951 (68 y.o. F) Treating RN: Harold Barban Primary Care Hernan Turnage: [name] Other Clinician: Referring Shontia Gillooly: Hollice Espy Treating Darica Goren/Extender: Tito Dine in Treatment: 0 Active Problems Location of Pain Severity and Description of Pain Patient Has Paino Yes Site Locations Rate the pain. Current Pain Level: 3 Worst Pain Level: Insensate Character of Pain Describe the Pain: Aching, Dull Pain Management and Medication Current Pain Management: Notes Dull pain in bladder Electronic Signature(s) Signed: 02/25/2019 4:39:36 PM By: Harold Barban Entered By: Harold Barban on 02/25/2019 15:21:54 Jeanne [name], Jeanne [name] ([id number]) -------------------------------------------------------------------------------- Patient/Caregiver Education  Details Patient Name: Jeanne Hind R. Date of Service: 02/25/2019 3:15 PM Medical Record Number: [id number] Patient Account Number: [id number] Date of Birth/Gender: 1950/09/11 (68 y.o. F) Treating RN: Cornell Barman Primary Care Physician: [name] Other Clinician: Referring Physician: Hollice Espy Treating Physician/Extender: Tito Dine in Treatment: 0 Education Assessment Education Provided To: Patient Education Topics Provided Hyperbaric Oxygenation: Handouts: Hyperbaric Oxygen, Other: HBO teaching and paperwork signed Methods: Demonstration, Explain/Verbal, Printed Responses: State content correctly Electronic Signature(s) Signed: 02/26/2019 4:58:07 PM By: Gretta Cool, BSN, RN, CWS, Kim RN, BSN Entered By: Gretta Cool, BSN, RN, CWS, Kim on 02/25/2019 17:22:19 Jeanne [name], Jeanne [name] ([id number]) -------------------------------------------------------------------------------- Vitals Details Patient Name: Jeanne Hind R. Date of Service: 02/25/2019 3:15 PM Medical Record Number: [id number] Patient Account Number: [id number] Date of Birth/Sex: 12/07/1950 (68 y.o. F) Treating RN: Harold Barban Primary Care Tiffini Blacksher: [name] Other Clinician: Referring Alyssa Mancera: Hollice Espy Treating Kou Gucciardo/Extender: Tito Dine in Treatment: 0 Vital Signs Time Taken: 15:20 Temperature (F): 97.9 Height (in): 64 Pulse (bpm): 73 Source: Stated Respiratory Rate (breaths/min): 16 Weight (lbs): 152 Blood Pressure (mmHg): 158/65 Source: Stated Reference Range: 80 - 120 mg / dl Body Mass Index (BMI): 26.1 Electronic Signature(s) Signed: 02/25/2019 4:39:36 PM By: Harold Barban Entered By: Harold Barban on 02/25/2019 15:22:30

## 2019-03-02 ENCOUNTER — Encounter: Payer: Self-pay | Admitting: Family Medicine

## 2019-03-03 ENCOUNTER — Other Ambulatory Visit: Payer: Self-pay

## 2019-03-03 ENCOUNTER — Other Ambulatory Visit: Payer: Self-pay | Admitting: Family Medicine

## 2019-03-03 DIAGNOSIS — N3090 Cystitis, unspecified without hematuria: Secondary | ICD-10-CM | POA: Diagnosis not present

## 2019-03-03 DIAGNOSIS — M0579 Rheumatoid arthritis with rheumatoid factor of multiple sites without organ or systems involvement: Secondary | ICD-10-CM

## 2019-03-03 LAB — POCT URINALYSIS DIPSTICK
Glucose, UA: NEGATIVE
Ketones, UA: NEGATIVE
Leukocytes, UA: NEGATIVE
Nitrite, UA: NEGATIVE
Protein, UA: POSITIVE — AB
Spec Grav, UA: 1.015 (ref 1.010–1.025)
Urobilinogen, UA: 0.2 E.U./dL
pH, UA: 6 (ref 5.0–8.0)

## 2019-03-03 NOTE — Telephone Encounter (Signed)
Spoke with patient to relay June 23 inbasket message regarding urine culture will be ran again to check for bacteria.   Pt stated she believes there is an infection because there is still "burning and an odor". She will wait for results. TF

## 2019-03-03 NOTE — Telephone Encounter (Signed)
Left v/m to call our office. Called regarding June 23 inBasket message regarding urine culture.  TF

## 2019-03-05 ENCOUNTER — Encounter: Payer: Self-pay | Admitting: Family Medicine

## 2019-03-05 ENCOUNTER — Other Ambulatory Visit: Payer: Self-pay | Admitting: Family Medicine

## 2019-03-05 ENCOUNTER — Ambulatory Visit
Admission: RE | Admit: 2019-03-05 | Discharge: 2019-03-05 | Disposition: A | Discharge status: Home / Self Care | Payer: Medicare HMO | Source: Ambulatory Visit | Attending: Urology | Admitting: Urology

## 2019-03-05 ENCOUNTER — Other Ambulatory Visit: Payer: Self-pay

## 2019-03-05 DIAGNOSIS — M0579 Rheumatoid arthritis with rheumatoid factor of multiple sites without organ or systems involvement: Secondary | ICD-10-CM

## 2019-03-05 DIAGNOSIS — N1330 Unspecified hydronephrosis: Secondary | ICD-10-CM

## 2019-03-05 DIAGNOSIS — N132 Hydronephrosis with renal and ureteral calculous obstruction: Secondary | ICD-10-CM | POA: Diagnosis not present

## 2019-03-05 LAB — URINE CULTURE

## 2019-03-05 MED ORDER — HYDROCODONE-ACETAMINOPHEN 7.5-325 MG PO TABS: 1.0000 | ORAL_TABLET | Freq: Four times a day (QID) | ORAL | 0 refills | Status: DC | PRN

## 2019-03-06 ENCOUNTER — Encounter: Payer: Self-pay | Admitting: Family Medicine

## 2019-03-06 ENCOUNTER — Other Ambulatory Visit: Payer: Self-pay | Admitting: Family Medicine

## 2019-03-06 DIAGNOSIS — N3090 Cystitis, unspecified without hematuria: Secondary | ICD-10-CM

## 2019-03-06 MED ORDER — AMOXICILLIN-POT CLAVULANATE 875-125 MG PO TABS: 1.0000 | ORAL_TABLET | Freq: Two times a day (BID) | ORAL | 0 refills | Status: DC

## 2019-03-17 ENCOUNTER — Ambulatory Visit: Payer: Medicare HMO | Admitting: Urology

## 2019-03-25 ENCOUNTER — Encounter: Payer: Self-pay | Admitting: Family Medicine

## 2019-03-27 ENCOUNTER — Encounter: Payer: Self-pay | Admitting: Family Medicine

## 2019-03-27 ENCOUNTER — Other Ambulatory Visit (INDEPENDENT_AMBULATORY_CARE_PROVIDER_SITE_OTHER): Payer: Medicare HMO

## 2019-03-27 ENCOUNTER — Other Ambulatory Visit: Payer: Self-pay

## 2019-03-27 DIAGNOSIS — N3020 Other chronic cystitis without hematuria: Secondary | ICD-10-CM | POA: Diagnosis not present

## 2019-03-27 LAB — POCT URINALYSIS DIPSTICK
Bilirubin, UA: NEGATIVE
Glucose, UA: NEGATIVE
Ketones, UA: NEGATIVE
Nitrite, UA: NEGATIVE
Protein, UA: NEGATIVE
Spec Grav, UA: 1.015 (ref 1.010–1.025)
Urobilinogen, UA: 0.2 E.U./dL
pH, UA: 6.5 (ref 5.0–8.0)

## 2019-03-27 IMAGING — CT CT RENAL STONE PROTOCOL
2 of 5 series · 15 of 46 positions shown, 17 images · non-contrast
Comparison: Ultrasound on 06/08/2016

Addendum:
CLINICAL DATA: Right flank pain. Nephrolithiasis. Recurrent bladder
infections. Personal history of cervical carcinoma and radiation
cystitis.

EXAM:
CT ABDOMEN AND PELVIS WITHOUT CONTRAST
TECHNIQUE: Multidetector CT imaging of the abdomen and pelvis was performed
following the standard protocol without IV contrast.

[Series 4: renal stone · coronal · 0.67mm/px · 3 of 136 slices shown]
[im 46/136  soft-tissue]
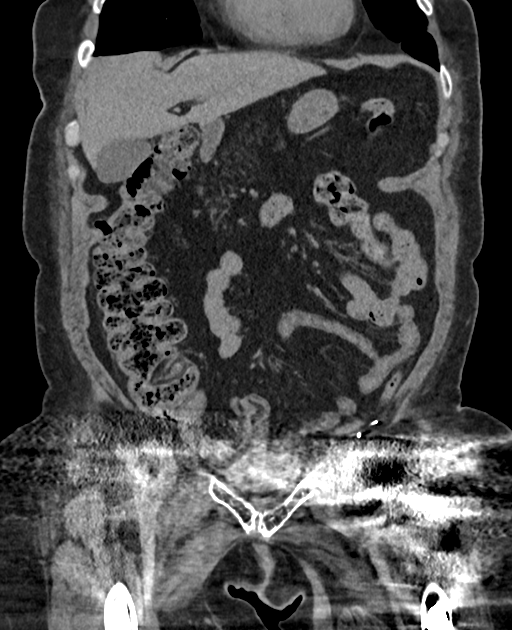
[im 61/136  soft-tissue]
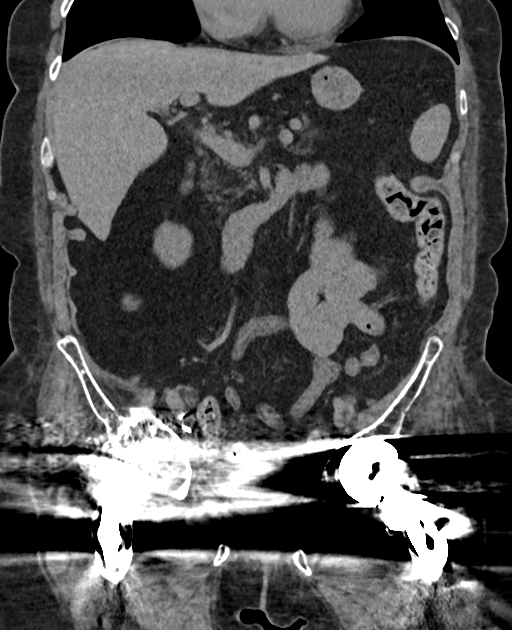
[im 76/136  soft-tissue]
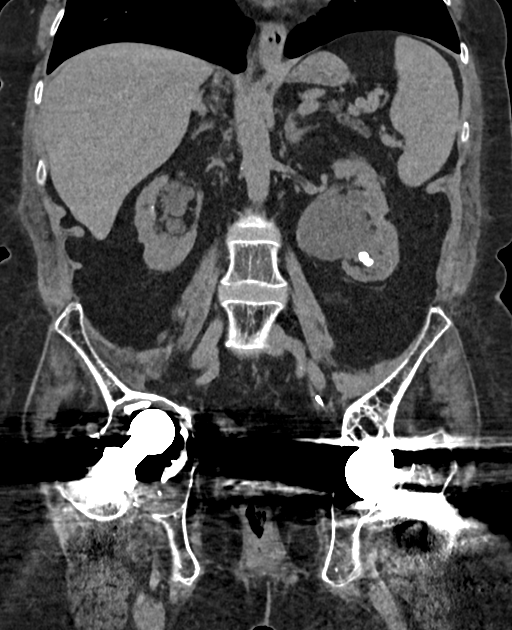

[Series 9: renal (person_name) · axial · 0.67mm/px · z∈[-1415,-1055]mm · 12 of 84 slices shown, 14 images]
[im 6/84  soft-tissue]
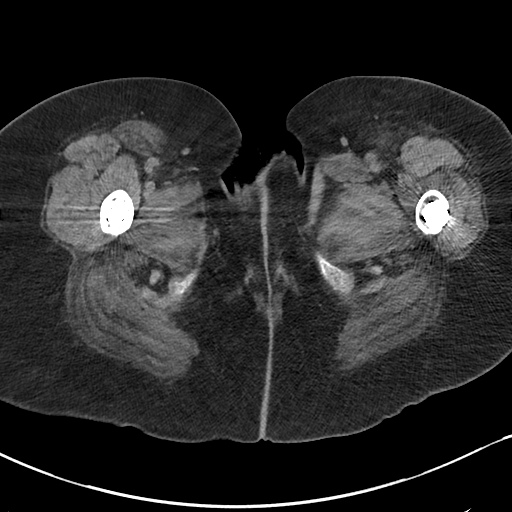
[im 6/84  bone]
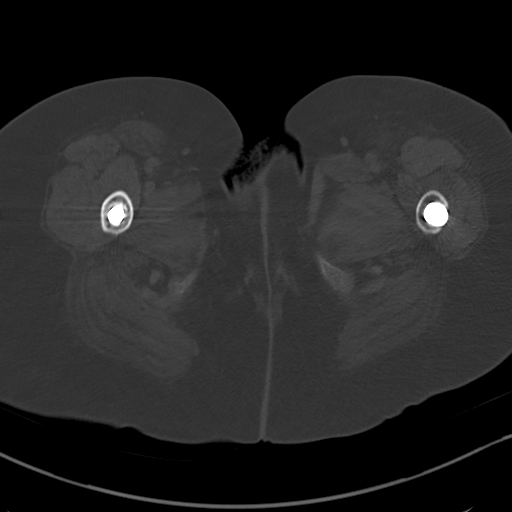
[im 12/84  soft-tissue]
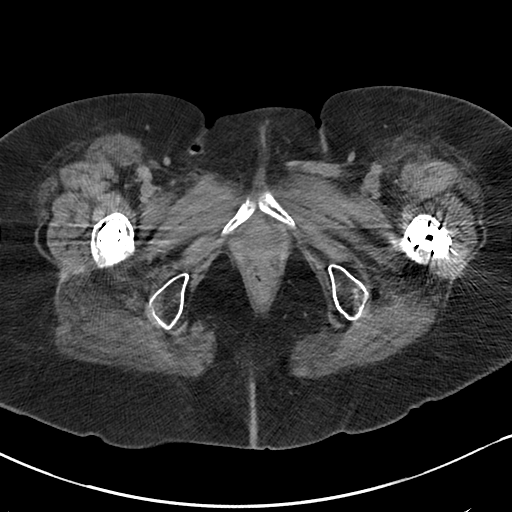
[im 18/84  soft-tissue]
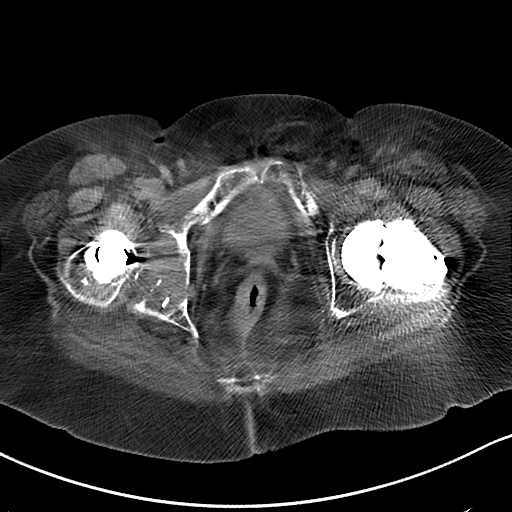
[im 24/84  soft-tissue]
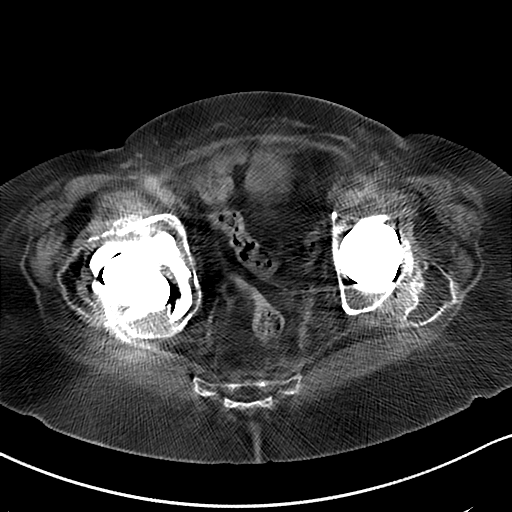
[im 30/84  soft-tissue]
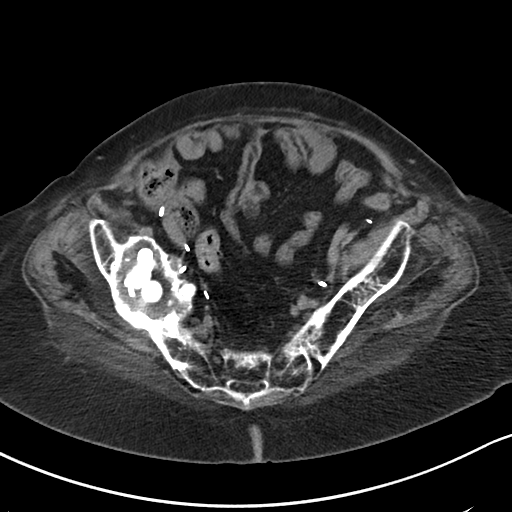
[im 36/84  soft-tissue]
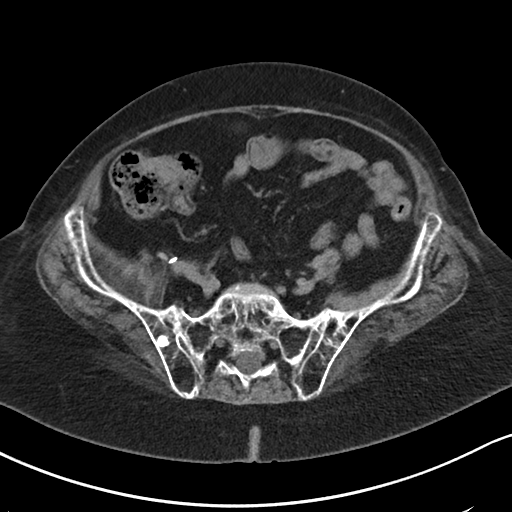
[im 48/84  soft-tissue]
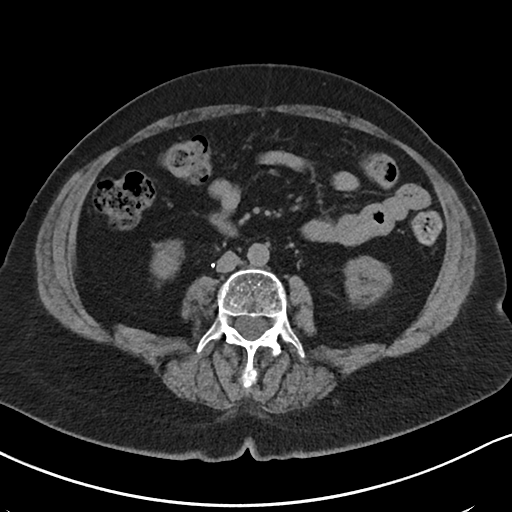
[im 54/84  soft-tissue]
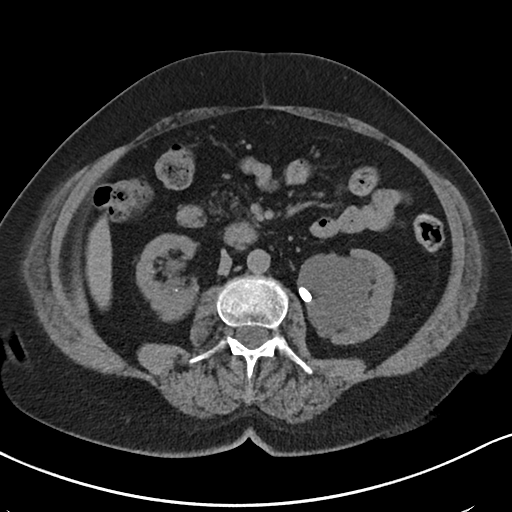
[im 60/84  soft-tissue]
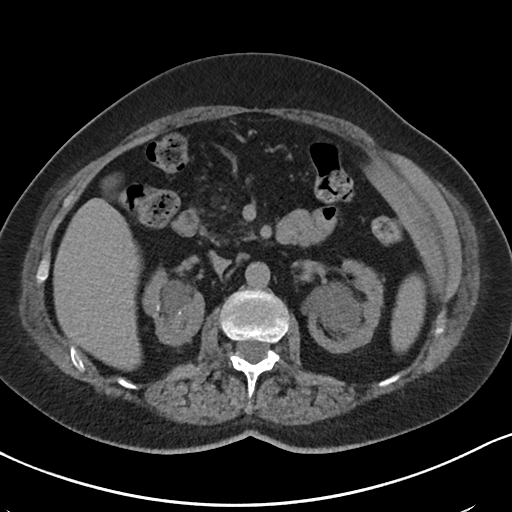
[im 60/84  bone]
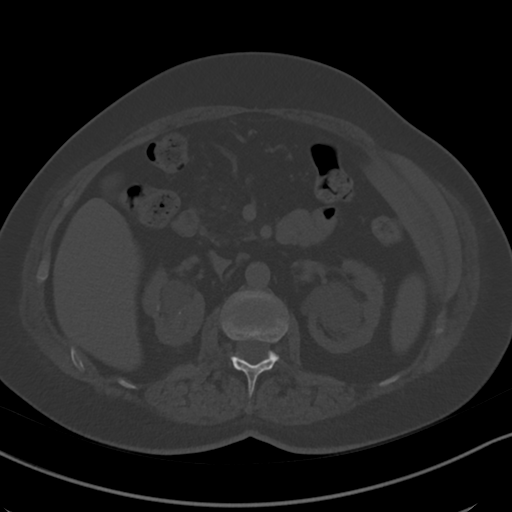
[im 66/84  soft-tissue]
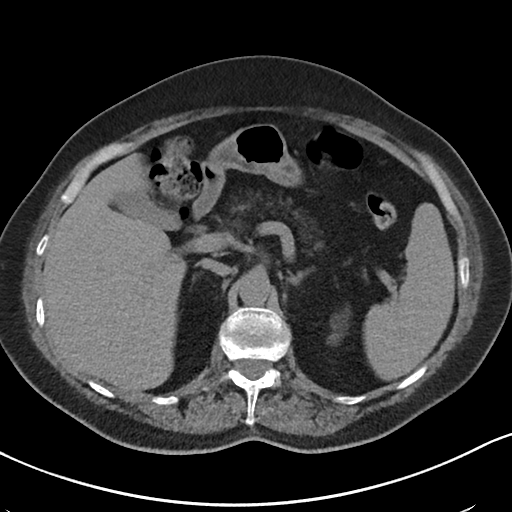
[im 72/84  soft-tissue]
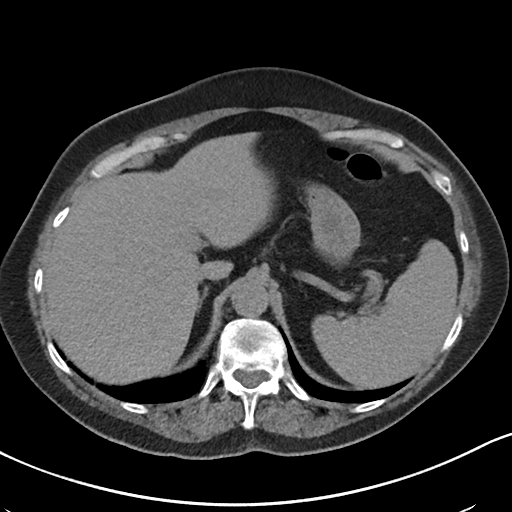
[im 78/84  soft-tissue]
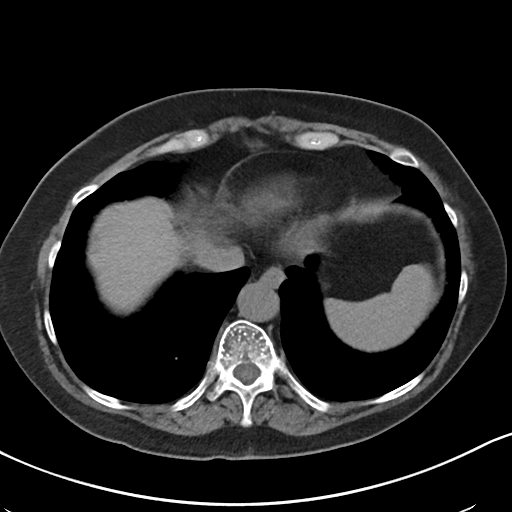

[15 of 46 positions shown; findings below may reference images not displayed]

FINDINGS: Lower Chest: 6 mm noncalcified pulmonary nodule seen in right lower
lobe.

Hepatobiliary: No hepatic masses identified. Gallbladder is
unremarkable.

Pancreas: No mass or inflammatory changes. Diffuse fatty replacement
noted.

Spleen: Within normal limits in size and appearance.

Adrenals/Urinary Tract: Several renal calculi are seen in both
kidneys. Several right renal parapelvic cysts are noted, but there
is no convincing evidence of hydronephrosis. No evidence of ureteral
calculi or dilatation.

Severe left hydronephrosis is seen with 2 calculi in the left renal
pelvis near the UPJ, largest measuring 10 mm. No evidence of
ureteral calculi or dilatation. Lower pelvic ureters and urinary
bladder are not visualized due to severe artifact from bilateral hip
prostheses.

Stomach/Bowel: No evidence of obstruction, inflammatory process or
abnormal fluid collections.

Vascular/Lymphatic: No pathologically enlarged lymph nodes. No
abdominal aortic aneurysm. Aortic atherosclerosis.

Reproductive: Not well visualized due to severe artifact from
bilateral hip prostheses.

Other:  None.

Musculoskeletal: No suspicious bone lesions identified. Chronic
appearing compression deformities of T12 and L1 vertebral bodies are
noted.
IMPRESSION: Severe left hydronephrosis. 2 calculi in region of left UPJ, largest
measuring 10 mm. The hydronephrosis could be due to these calculi or
congenital UPJ obstruction. No evidence of ureteral calculi or
dilatation.

Several right renal sinus cysts, without definite evidence of
hydronephrosis.

Bilateral nephrolithiasis.

Nonvisualization of bladder and other lower pelvic organs due to
severe artifact from bilateral hip prostheses.

ADDENDUM:
Additional item not shown in Impression section below:

Other Findings Warranting Further Action:

6 mm indeterminate right lower lobe pulmonary nodule. Non-contrast
chest CT at 6-12 months is recommended. If the nodule is stable at
time of repeat CT, then future CT at 18-24 months (from today's
scan) is considered optional for low-risk patients, but is
recommended for high-risk patients. This recommendation follows the
consensus statement: Guidelines for Management of Incidental
Pulmonary Nodules Detected on CT Images: From the [HOSPITAL]

*** End of Addendum ***

## 2019-03-27 MED ORDER — AMOXICILLIN-POT CLAVULANATE 875-125 MG PO TABS: 1.0000 | ORAL_TABLET | Freq: Two times a day (BID) | ORAL | 0 refills | Status: DC

## 2019-03-30 ENCOUNTER — Telehealth: Payer: Self-pay

## 2019-03-30 NOTE — Progress Notes (Signed)
Large bacterial growth on preliminary culture report. Awaiting final identification and sensitivity report. Continue Augmentin BID and drink extra water in diet. May use AZO-Standard if needed for discomfort.

## 2019-03-30 NOTE — Telephone Encounter (Signed)
[id number] 

## 2019-03-30 NOTE — Telephone Encounter (Signed)
-----   Message from Margo Common, [place] sent at 03/30/2019 10:53 AM EDT ----- Large bacterial growth on preliminary culture report. Awaiting final identification and sensitivity report. Continue Augmentin BID and drink extra water in diet. May use AZO-Standard if needed for discomfort.

## 2019-03-30 NOTE — Telephone Encounter (Signed)
Pt returned call  tp

## 2019-03-31 NOTE — Telephone Encounter (Signed)
Patient was advised.  

## 2019-04-01 ENCOUNTER — Other Ambulatory Visit: Payer: Self-pay | Admitting: Family Medicine

## 2019-04-01 DIAGNOSIS — M0579 Rheumatoid arthritis with rheumatoid factor of multiple sites without organ or systems involvement: Secondary | ICD-10-CM

## 2019-04-01 LAB — URINE CULTURE

## 2019-04-01 MED ORDER — HYDROCODONE-ACETAMINOPHEN 7.5-325 MG PO TABS: 1.0000 | ORAL_TABLET | Freq: Four times a day (QID) | ORAL | 0 refills | Status: DC | PRN

## 2019-04-02 ENCOUNTER — Telehealth: Payer: Self-pay

## 2019-04-02 NOTE — Telephone Encounter (Signed)
-----   Message from [place], [place] sent at 04/01/2019  5:27 PM EDT ----- The final urine culture report showed the Augmentin was the appropriate antibiotic to use for this infection. Recheck urinalysis if any symptoms remain after finishing the Augmentin.

## 2019-04-02 NOTE — Telephone Encounter (Signed)
Patient was advised.  

## 2019-04-06 ENCOUNTER — Other Ambulatory Visit: Payer: Self-pay | Admitting: Family Medicine

## 2019-04-06 DIAGNOSIS — M0579 Rheumatoid arthritis with rheumatoid factor of multiple sites without organ or systems involvement: Secondary | ICD-10-CM

## 2019-04-13 ENCOUNTER — Encounter: Payer: Self-pay | Admitting: Family Medicine

## 2019-04-15 ENCOUNTER — Encounter: Payer: Self-pay | Admitting: Family Medicine

## 2019-04-26 ENCOUNTER — Encounter (HOSPITAL_COMMUNITY): Payer: Self-pay

## 2019-04-26 ENCOUNTER — Other Ambulatory Visit: Payer: Self-pay

## 2019-04-26 ENCOUNTER — Emergency Department (HOSPITAL_COMMUNITY)
Admission: EM | Admit: 2019-04-26 | Discharge: 2019-04-27 | Disposition: A | Discharge status: Home / Self Care | Payer: Medicare HMO | Attending: Emergency Medicine | Admitting: Emergency Medicine

## 2019-04-26 ENCOUNTER — Encounter: Payer: Self-pay | Admitting: Family Medicine

## 2019-04-26 DIAGNOSIS — N3090 Cystitis, unspecified without hematuria: Secondary | ICD-10-CM | POA: Diagnosis not present

## 2019-04-26 DIAGNOSIS — Z96643 Presence of artificial hip joint, bilateral: Secondary | ICD-10-CM | POA: Diagnosis not present

## 2019-04-26 DIAGNOSIS — Z8541 Personal history of malignant neoplasm of cervix uteri: Secondary | ICD-10-CM | POA: Insufficient documentation

## 2019-04-26 DIAGNOSIS — Z87891 Personal history of nicotine dependence: Secondary | ICD-10-CM | POA: Diagnosis not present

## 2019-04-26 DIAGNOSIS — I10 Essential (primary) hypertension: Secondary | ICD-10-CM | POA: Insufficient documentation

## 2019-04-26 DIAGNOSIS — Z96651 Presence of right artificial knee joint: Secondary | ICD-10-CM | POA: Insufficient documentation

## 2019-04-26 DIAGNOSIS — Z79899 Other long term (current) drug therapy: Secondary | ICD-10-CM | POA: Insufficient documentation

## 2019-04-26 DIAGNOSIS — R300 Dysuria: Secondary | ICD-10-CM | POA: Diagnosis present

## 2019-04-26 DIAGNOSIS — N3000 Acute cystitis without hematuria: Secondary | ICD-10-CM | POA: Diagnosis not present

## 2019-04-26 DIAGNOSIS — R000 Tachycardia, unspecified: Secondary | ICD-10-CM | POA: Diagnosis not present

## 2019-04-26 LAB — CBC WITH DIFFERENTIAL/PLATELET
Abs Immature Granulocytes: 0.11 10*3/uL — ABNORMAL HIGH (ref 0.00–0.07)
Basophils Absolute: 0 10*3/uL (ref 0.0–0.1)
Basophils Relative: 0 %
Eosinophils Absolute: 0 10*3/uL (ref 0.0–0.5)
Eosinophils Relative: 0 %
HCT: 46.3 % — ABNORMAL HIGH (ref 36.0–46.0)
Hemoglobin: 14.9 g/dL (ref 12.0–15.0)
Immature Granulocytes: 1 %
Lymphocytes Relative: 5 %
Lymphs Abs: 0.8 10*3/uL (ref 0.7–4.0)
MCH: 29.2 pg (ref 26.0–34.0)
MCHC: 32.2 g/dL (ref 30.0–36.0)
MCV: 90.8 fL (ref 80.0–100.0)
Monocytes Absolute: 0.8 10*3/uL (ref 0.1–1.0)
Monocytes Relative: 5 %
Neutro Abs: 13.6 10*3/uL — ABNORMAL HIGH (ref 1.7–7.7)
Neutrophils Relative %: 89 %
Platelets: 225 10*3/uL (ref 150–400)
RBC: 5.1 MIL/uL (ref 3.87–5.11)
RDW: 13.2 % (ref 11.5–15.5)
WBC: 15.4 10*3/uL — ABNORMAL HIGH (ref 4.0–10.5)
nRBC: 0 % (ref 0.0–0.2)

## 2019-04-26 LAB — URINALYSIS, ROUTINE W REFLEX MICROSCOPIC
Bacteria, UA: NONE SEEN
Bilirubin Urine: NEGATIVE
Glucose, UA: NEGATIVE mg/dL
Ketones, ur: NEGATIVE mg/dL
Nitrite: NEGATIVE
Protein, ur: 100 mg/dL — AB
RBC / HPF: >50 RBC/hpf — ABNORMAL HIGH (ref 0–5)
Specific Gravity, Urine: 1.019 (ref 1.005–1.030)
WBC, UA: >50 WBC/hpf — ABNORMAL HIGH (ref 0–5)
pH: 5 (ref 5.0–8.0)

## 2019-04-26 LAB — COMPREHENSIVE METABOLIC PANEL
ALT: 19 U/L (ref 0–44)
AST: 21 U/L (ref 15–41)
Albumin: 3.1 g/dL — ABNORMAL LOW (ref 3.5–5.0)
Alkaline Phosphatase: 213 U/L — ABNORMAL HIGH (ref 38–126)
Anion gap: 15 (ref 5–15)
BUN: 18 mg/dL (ref 8–23)
CO2: 20 mmol/L — ABNORMAL LOW (ref 22–32)
Calcium: 9.1 mg/dL (ref 8.9–10.3)
Chloride: 102 mmol/L (ref 98–111)
Creatinine, Ser: 1 mg/dL (ref 0.44–1.00)
GFR calc Af Amer: >60 mL/min (ref >60)
GFR calc non Af Amer: 59 mL/min — ABNORMAL LOW (ref >60)
Glucose, Bld: 136 mg/dL — ABNORMAL HIGH (ref 70–99)
Potassium: 3.9 mmol/L (ref 3.5–5.1)
Sodium: 137 mmol/L (ref 135–145)
Total Bilirubin: 2.1 mg/dL — ABNORMAL HIGH (ref 0.3–1.2)
Total Protein: 7.2 g/dL (ref 6.5–8.1)

## 2019-04-26 LAB — LACTIC ACID, PLASMA
Lactic Acid, Venous: 2.8 mmol/L (ref 0.5–1.9)
Lactic Acid, Venous: 2 mmol/L (ref 0.5–1.9)

## 2019-04-26 MED ORDER — SODIUM CHLORIDE 0.9 % IV BOLUS (SEPSIS)
1000.0000 mL | Freq: Once | INTRAVENOUS | Status: AC
  Administered 2019-04-27: 1000 mL via INTRAVENOUS

## 2019-04-26 MED ORDER — SODIUM CHLORIDE 0.9 % IV BOLUS
1000.0000 mL | Freq: Once | INTRAVENOUS | Status: DC
  Administered 2019-04-27: 1000 mL via INTRAVENOUS

## 2019-04-26 MED ORDER — SODIUM CHLORIDE 0.9% FLUSH
3.0000 mL | Freq: Once | INTRAVENOUS | Status: AC
  Administered 2019-04-27: 3 mL via INTRAVENOUS

## 2019-04-26 MED ORDER — SODIUM CHLORIDE 0.9 % IV SOLN
1.0000 g | INTRAVENOUS | Status: DC
  Administered 2019-04-27: 1 g via INTRAVENOUS
  Filled 2019-04-26: qty 10

## 2019-04-26 NOTE — ED Provider Notes (Addendum)
[facility]GENC[facility] DEPARTMENT Provider Note   CSN: [id number] Arrival date & time: 04/26/19  1903    History   Chief Complaint Chief Complaint  Patient presents with  . Recurrent UTI    HPI Jeanne [name] is a 68 y.o. female who presents with 3 to 4-day history of burning with urination, frequency urination, pelvic pain.  Patient was last seen for this problem by her primary care physician in late July.  She is diagnosed with a Klebsiella UTI was treated with Augmentin.  She states that these symptoms never completely resolved despite finishing the course of Augmentin.  Patient has a history of cervical cancer which required radical hysterectomy, bilateral salpingo-oophorectomy, pelvic aortic lymphadenectomy.  Patient has subsequent cisplatin and radiation treatment.  These all occurred in 2004/2005.  Patient has gotten recurrent UTIs since then.  She states that she first started feeling "really bad overnight".  She had one episode of emesis is been able to keep anything down.  States she has had a fever of 101.2 at home.  Not taking anything for this fever.  Past Medical History:  Diagnosis Date  . Arthritis    RA  . Cervical cancer ([place])   . Complication of anesthesia   . History of kidney stones   . Hypertension   . PONV (postoperative nausea and vomiting)     Patient Active Problem List   Diagnosis Date Noted  . Recurrent UTI 07/15/2017  . Dysuria 01/28/2017  . Chronic radiation cystitis 02/28/2015  . Coitalgia 02/28/2015  . Blood pressure elevated 02/28/2015  . Calculus of kidney 02/28/2015  . Arthritis or polyarthritis, rheumatoid ([place]) 02/28/2015  . Basal cell papilloma 02/28/2015  . H/O cataract extraction 09/22/2014  . Combined form of senile cataract 07/07/2014  . NS (nuclear sclerosis) 05/21/2014  . History of repair of hip joint 03/16/2013  . H/O total knee replacement 03/16/2013  . 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 ABDOMINAL HYSTERECTOMY  09/10/2003  . CYSTOGRAM N/A 08/04/2018   Procedure: CYSTOGRAM WITH URETHAL DILATION;  Surgeon: Hollice Espy, MD;  Location: [facility] ORS;  Service: Urology;  Laterality: N/A;  . CYSTOSCOPY W/ RETROGRADES Bilateral 08/04/2018   Procedure: CYSTOSCOPY WITH RETROGRADE PYELOGRAM;  Surgeon: Hollice Espy, MD;  Location: [facility] ORS;  Service: Urology;  Laterality: Bilateral;  . CYSTOSCOPY/URETEROSCOPY/HOLMIUM LASER/STENT PLACEMENT Left 08/04/2018   Procedure: CYSTOSCOPY/URETEROSCOPY/HOLMIUM LASER/STENT PLACEMENT;  Surgeon: Hollice Espy, MD;  Location: [facility] ORS;  Service: Urology;  Laterality: Left;  . JOINT REPLACEMENT    . TOTAL HIP ARTHROPLASTY Right 1972  . TOTAL HIP ARTHROPLASTY Left 1981  . TOTAL KNEE ARTHROPLASTY Right 1990     OB History   No obstetric history on file.      Home Medications    Prior to Admission medications   Medication Sig Start Date End Date Taking? Authorizing Provider  amoxicillin-clavulanate (AUGMENTIN) 875-125 MG tablet Take 1 tablet by mouth 2 (two) times daily. 03/06/19   Chrismon, Vickki Muff, PA  amoxicillin-clavulanate (AUGMENTIN) 875-125 MG tablet Take 1 tablet by mouth 2 (two) times daily. 03/27/19   Chrismon, Vickki Muff, PA  Aspirin-Caffeine (BC FAST PAIN RELIEF PO) Take 1 Package by mouth daily as needed (headache).     [provider]  CRANBERRY PO Take by mouth.    [provider]  diclofenac (VOLTAREN) 75 MG EC tablet TAKE 1 TABLET BY MOUTH TWICE A DAY 04/06/19   Chrismon, Simona Huh  E, PA  docusate sodium (COLACE) 100 MG capsule Take 1 capsule (100 mg total) by mouth 2 (two) times daily. 08/04/18   Hollice Espy, MD  HYDROcodone-acetaminophen (NORCO) 7.5-325 MG tablet Take 1 tablet by mouth every 6 (six) hours as needed for moderate pain. Reviewed controlled substance record in PMP Aware. 04/01/19   Chrismon, Vickki Muff, PA  lactobacillus acidophilus (BACID)  TABS tablet Take 1 tablet by mouth daily.    [provider]  metoprolol tartrate (LOPRESSOR) 25 MG tablet Take 1 tablet (25 mg total) by mouth 2 (two) times daily. 11/27/18   Chrismon, Vickki Muff, PA  mirabegron ER (MYRBETRIQ) 25 MG TB24 tablet Take 1 tablet (25 mg total) by mouth daily. 10/14/18   Hollice Espy, MD  Probiotic Product (PROBIOTIC PO) Take by mouth.    [provider]    Family History Family History  Problem Relation Age of Onset  . Breast cancer Mother   . Hypertension Father   . Cancer Father   . Prostate cancer Neg Hx   . Kidney cancer Neg Hx   . Bladder Cancer Neg Hx     Social History Social History   Tobacco Use  . Smoking status: Former Smoker    Years: 20.00    Quit date: 09/09/1989    Years since quitting: 29.6  . Smokeless tobacco: Never Used  . Tobacco comment: QUIT IN 1990  Substance Use Topics  . Alcohol use: No    Alcohol/week: 0.0 standard drinks  . Drug use: No     Allergies   Codeine and Sulfa antibiotics   Review of Systems Review of Systems  Constitutional: Positive for chills and fever. Negative for activity change.  HENT: Negative for sinus pressure and sinus pain.   Respiratory: Negative for chest tightness and shortness of breath.   Cardiovascular: Negative for chest pain.  Genitourinary: Positive for dysuria, frequency and urgency.  Musculoskeletal: Negative for arthralgias and back pain.  Neurological: Negative for dizziness and light-headedness.     Physical Exam Updated Vital Signs BP (!) 112/92 (BP Location: Right Arm)   Pulse (!) 136   Temp (!) 97.4 F (36.3 C) (Oral)   Resp 18   Ht 5\' 4"  (1.626 m)   Wt 64 kg   SpO2 100%   BMI 24.20 kg/m   Physical Exam Constitutional:      General: She is not in acute distress.    Appearance: Normal appearance. She is not ill-appearing.  HENT:     Head: Normocephalic and atraumatic.     Mouth/Throat:     Mouth: Mucous membranes are moist.  Eyes:      General:        Right eye: No discharge.        Left eye: No discharge.     Conjunctiva/sclera: Conjunctivae normal.  Neck:     Musculoskeletal: Normal range of motion.  Cardiovascular:     Rate and Rhythm: Regular rhythm. Tachycardia present.     Heart sounds: No murmur.  Pulmonary:     Effort: Pulmonary effort is normal.     Breath sounds: Normal breath sounds.  Abdominal:     General: Abdomen is flat.     Palpations: Abdomen is soft.     Comments: Radiation tattoo noted inferior to the umbilicus.  Musculoskeletal: Normal range of motion.  Skin:    General: Skin is warm and dry.     Capillary Refill: Capillary refill takes less than 2 seconds.  Neurological:  General: No focal deficit present.     Mental Status: She is alert and oriented to person, place, and time.     Cranial Nerves: No cranial nerve deficit.  Psychiatric:        Mood and Affect: Mood normal.      ED Treatments / Results  Labs (all labs ordered are listed, but only abnormal results are displayed) Labs Reviewed  LACTIC ACID, PLASMA - Abnormal; Notable for the following components:      Result Value   Lactic Acid, Venous 2.8 (*)    All other components within normal limits  COMPREHENSIVE METABOLIC PANEL - Abnormal; Notable for the following components:   CO2 20 (*)    Glucose, Bld 136 (*)    Albumin 3.1 (*)    Alkaline Phosphatase 213 (*)    Total Bilirubin 2.1 (*)    GFR calc non Af Amer 59 (*)    All other components within normal limits  CBC WITH DIFFERENTIAL/PLATELET - Abnormal; Notable for the following components:   WBC 15.4 (*)    HCT 46.3 (*)    Neutro Abs 13.6 (*)    Abs Immature Granulocytes 0.11 (*)    All other components within normal limits  URINALYSIS, ROUTINE W REFLEX MICROSCOPIC - Abnormal; Notable for the following components:   Color, Urine AMBER (*)    APPearance CLOUDY (*)    Hgb urine dipstick MODERATE (*)    Protein, ur 100 (*)    Leukocytes,Ua LARGE (*)    RBC /  HPF >50 (*)    WBC, UA >50 (*)    All other components within normal limits  CULTURE, BLOOD (ROUTINE X 2)  CULTURE, BLOOD (ROUTINE X 2)  URINE CULTURE  LACTIC ACID, PLASMA    EKG None  Radiology No results found.  Procedures Procedures (including critical care time)  Medications Ordered in ED Medications  sodium chloride flush (NS) 0.9 % injection 3 mL (has no administration in time range)  sodium chloride 0.9 % bolus 1,000 mL (has no administration in time range)    And  sodium chloride 0.9 % bolus 1,000 mL (has no administration in time range)  cefTRIAXone (ROCEPHIN) 1 g in sodium chloride 0.9 % 100 mL IVPB (has no administration in time range)     Initial Impression / Assessment and Plan / ED Course  I have reviewed the triage vital signs and the nursing notes.  Pertinent labs & imaging results that were available during my care of the patient were reviewed by me and considered in my medical decision making (see chart for details).        68 year old female who presents for burning with urination, frequency, pelvic pain.  History of cervical cancer requiring radical hysterectomy, bilateral salpingo-oophorectomy, and lymph node dissection.  Status post chemotherapy and radiation.  Has subsequently had frequent urinary tract infection since 2004.  Status post Augmentin course for most recent UTI.  Had 100,000 colony-forming units of Klebsiella in the urine culture.  Patient with tachycardia, and blood pressures in the 073 range systolic.  Lactic acid 2.9 initial check.  Recheck lactic acid pending.  Will give patient 2 L bolus will saline, get blood and urine cultures, and start on ceftriaxone 1 g.  Repeat lactic acid down to 2.0. Could possibly consider ct stone study if patient fails to improve with these items. Case handed off to oncoming provider with fluids and antibiotics running.   Final Clinical Impressions(s) / ED Diagnoses   Final  diagnoses:  Tachycardia   Acute cystitis without hematuria    ED Discharge Orders    None    Guadalupe Dawn MD PGY-3 Family Medicine Resident   Guadalupe Dawn, MD 04/26/19 [id number]    Guadalupe Dawn, MD 04/26/19 [id number]    Elnora Morrison, MD 04/27/19 Alyson Ingles    Elnora Morrison, MD 05/05/19 [phone]

## 2019-04-26 NOTE — ED Notes (Signed)
unsuccessful IV start. IV team consulted

## 2019-04-26 NOTE — ED Triage Notes (Signed)
Pt states that she has recurrent UTI's, just finished antibiotics one week ago, symptoms have not resolved. Fever earlier today

## 2019-04-27 ENCOUNTER — Other Ambulatory Visit: Payer: Self-pay

## 2019-04-27 MED ORDER — PROMETHAZINE HCL 25 MG/ML IJ SOLN
12.5000 mg | Freq: Once | INTRAMUSCULAR | Status: AC
  Administered 2019-04-27: 12.5 mg via INTRAVENOUS
  Filled 2019-04-27: qty 1

## 2019-04-27 MED ORDER — SODIUM CHLORIDE 0.9 % IV BOLUS
1000.0000 mL | Freq: Once | INTRAVENOUS | Status: AC
  Administered 2019-04-27: 1000 mL via INTRAVENOUS

## 2019-04-27 MED ORDER — PROMETHAZINE HCL 25 MG PO TABS: 12.5000 mg | ORAL_TABLET | Freq: Four times a day (QID) | ORAL | 0 refills | Status: DC | PRN

## 2019-04-27 MED ORDER — CEPHALEXIN 500 MG PO CAPS: 500.0000 mg | ORAL_CAPSULE | Freq: Four times a day (QID) | ORAL | 0 refills | Status: DC

## 2019-04-27 NOTE — ED Notes (Signed)
Lactic trending down, no need right now for further testing

## 2019-04-28 LAB — URINE CULTURE

## 2019-04-29 ENCOUNTER — Ambulatory Visit: Payer: Medicare HMO | Admitting: Urology

## 2019-05-02 LAB — CULTURE, BLOOD (ROUTINE X 2)
Culture: NO GROWTH
Culture: NO GROWTH
Special Requests: ADEQUATE

## 2019-05-07 ENCOUNTER — Encounter: Payer: Self-pay | Admitting: Family Medicine

## 2019-05-07 ENCOUNTER — Other Ambulatory Visit: Payer: Self-pay | Admitting: Family Medicine

## 2019-05-07 DIAGNOSIS — M0579 Rheumatoid arthritis with rheumatoid factor of multiple sites without organ or systems involvement: Secondary | ICD-10-CM

## 2019-05-07 MED ORDER — HYDROCODONE-ACETAMINOPHEN 7.5-325 MG PO TABS: 1.0000 | ORAL_TABLET | Freq: Four times a day (QID) | ORAL | 0 refills | Status: DC | PRN

## 2019-05-09 ENCOUNTER — Other Ambulatory Visit: Payer: Self-pay | Admitting: Family Medicine

## 2019-05-09 DIAGNOSIS — M0579 Rheumatoid arthritis with rheumatoid factor of multiple sites without organ or systems involvement: Secondary | ICD-10-CM

## 2019-05-15 ENCOUNTER — Encounter: Payer: Self-pay | Admitting: Family Medicine

## 2019-05-19 ENCOUNTER — Encounter: Payer: Self-pay | Admitting: Family Medicine

## 2019-05-19 ENCOUNTER — Other Ambulatory Visit: Payer: Self-pay

## 2019-05-19 DIAGNOSIS — N3020 Other chronic cystitis without hematuria: Secondary | ICD-10-CM | POA: Diagnosis not present

## 2019-05-19 NOTE — Progress Notes (Signed)
[place] per [employer].

## 2019-05-22 ENCOUNTER — Other Ambulatory Visit: Payer: Self-pay | Admitting: Family Medicine

## 2019-05-22 ENCOUNTER — Encounter: Payer: Self-pay | Admitting: Family Medicine

## 2019-05-22 DIAGNOSIS — N390 Urinary tract infection, site not specified: Secondary | ICD-10-CM

## 2019-05-22 LAB — URINE CULTURE

## 2019-05-22 MED ORDER — CEFUROXIME AXETIL 250 MG PO TABS: 250.0000 mg | ORAL_TABLET | Freq: Two times a day (BID) | ORAL | 0 refills | Status: DC

## 2019-06-01 ENCOUNTER — Encounter: Payer: Self-pay | Admitting: Family Medicine

## 2019-06-02 ENCOUNTER — Encounter: Payer: Self-pay | Admitting: Family Medicine

## 2019-06-02 ENCOUNTER — Other Ambulatory Visit: Payer: Self-pay

## 2019-06-02 ENCOUNTER — Other Ambulatory Visit: Payer: Self-pay | Admitting: Family Medicine

## 2019-06-02 DIAGNOSIS — N390 Urinary tract infection, site not specified: Secondary | ICD-10-CM

## 2019-06-02 DIAGNOSIS — R319 Hematuria, unspecified: Secondary | ICD-10-CM | POA: Diagnosis not present

## 2019-06-02 DIAGNOSIS — M0579 Rheumatoid arthritis with rheumatoid factor of multiple sites without organ or systems involvement: Secondary | ICD-10-CM

## 2019-06-02 LAB — POCT URINALYSIS DIPSTICK
Bilirubin, UA: NEGATIVE
Glucose, UA: NEGATIVE
Ketones, UA: NEGATIVE
Leukocytes, UA: NEGATIVE
Nitrite, UA: NEGATIVE
Protein, UA: NEGATIVE
Spec Grav, UA: 1.025 (ref 1.010–1.025)
Urobilinogen, UA: 0.2 E.U./dL
pH, UA: 6 (ref 5.0–8.0)

## 2019-06-02 LAB — POCT UA - MICROSCOPIC ONLY
Crystals, Ur, HPF, POC: NEGATIVE
Yeast, UA: NEGATIVE

## 2019-06-02 MED ORDER — HYDROCODONE-ACETAMINOPHEN 7.5-325 MG PO TABS: 1.0000 | ORAL_TABLET | Freq: Four times a day (QID) | ORAL | 0 refills | Status: DC | PRN

## 2019-06-03 ENCOUNTER — Encounter: Payer: Self-pay | Admitting: Family Medicine

## 2019-06-04 ENCOUNTER — Encounter: Payer: Self-pay | Admitting: Family Medicine

## 2019-06-05 ENCOUNTER — Other Ambulatory Visit: Payer: Self-pay | Admitting: Family Medicine

## 2019-06-05 DIAGNOSIS — N390 Urinary tract infection, site not specified: Secondary | ICD-10-CM

## 2019-06-05 LAB — URINE CULTURE

## 2019-06-05 MED ORDER — CIPROFLOXACIN HCL 500 MG PO TABS: 500.0000 mg | ORAL_TABLET | Freq: Two times a day (BID) | ORAL | 0 refills | Status: DC

## 2019-06-08 ENCOUNTER — Telehealth: Payer: Self-pay

## 2019-06-08 NOTE — Telephone Encounter (Signed)
-----   Message from [place], [place] sent at 06/05/2019  1:38 PM EDT ----- As compared to first culture, different intestinal bacteria is present this time - enterococcus faecalis versus E.coli. Need to change to Ciprofloxacin 500 mg BID #20. Recheck for cure in 10 days.

## 2019-06-08 NOTE — Telephone Encounter (Signed)
Patient notified of lab results. Already started taking the Ciprofloxacin.

## 2019-06-12 ENCOUNTER — Encounter: Payer: Self-pay | Admitting: Family Medicine

## 2019-06-15 ENCOUNTER — Encounter: Payer: Self-pay | Admitting: Family Medicine

## 2019-06-16 ENCOUNTER — Other Ambulatory Visit: Payer: Self-pay

## 2019-06-16 ENCOUNTER — Other Ambulatory Visit (INDEPENDENT_AMBULATORY_CARE_PROVIDER_SITE_OTHER): Payer: Medicare HMO

## 2019-06-16 DIAGNOSIS — R319 Hematuria, unspecified: Secondary | ICD-10-CM

## 2019-06-16 DIAGNOSIS — N390 Urinary tract infection, site not specified: Secondary | ICD-10-CM | POA: Diagnosis not present

## 2019-06-16 LAB — POCT URINALYSIS DIPSTICK
Bilirubin, UA: NEGATIVE
Glucose, UA: NEGATIVE
Ketones, UA: NEGATIVE
Nitrite, UA: POSITIVE
Protein, UA: POSITIVE — AB
Spec Grav, UA: 1.02 (ref 1.010–1.025)
Urobilinogen, UA: 0.2 E.U./dL
pH, UA: 5 (ref 5.0–8.0)

## 2019-06-19 ENCOUNTER — Telehealth: Payer: Self-pay

## 2019-06-19 LAB — URINE CULTURE

## 2019-06-19 NOTE — Telephone Encounter (Signed)
-----   Message from Margo [place], [place] sent at 06/18/2019 12:41 PM EDT ----- Preliminary culture report indicates bacterial rods in a large quantity. This is usually an intestinal bacteria. Awaiting antibiotic sensitivity report. Be sure no possible intestinal contamination of urinary tract - could come from something as simple as wiping back-to-front after urinating or having a BM. Keep drinking fluid (and cranberry).

## 2019-06-19 NOTE — Telephone Encounter (Signed)
Patient notified of preliminary results and advice.

## 2019-06-22 ENCOUNTER — Encounter: Payer: Self-pay | Admitting: Family Medicine

## 2019-06-22 ENCOUNTER — Other Ambulatory Visit: Payer: Self-pay | Admitting: Family Medicine

## 2019-06-22 DIAGNOSIS — N390 Urinary tract infection, site not specified: Secondary | ICD-10-CM

## 2019-06-22 MED ORDER — NITROFURANTOIN MONOHYD MACRO 100 MG PO CAPS: 100.0000 mg | ORAL_CAPSULE | Freq: Two times a day (BID) | ORAL | 0 refills | Status: DC

## 2019-06-22 NOTE — Progress Notes (Signed)
Urine culture isolated E.coli sensitive to the Nitrofurantoin this time. Recheck urinalysis in 10 days.

## 2019-06-25 ENCOUNTER — Encounter: Payer: Self-pay | Admitting: Family Medicine

## 2019-06-25 ENCOUNTER — Other Ambulatory Visit: Payer: Self-pay | Admitting: Family Medicine

## 2019-06-25 DIAGNOSIS — M0579 Rheumatoid arthritis with rheumatoid factor of multiple sites without organ or systems involvement: Secondary | ICD-10-CM

## 2019-06-25 MED ORDER — HYDROCODONE-ACETAMINOPHEN 7.5-325 MG PO TABS: 1.0000 | ORAL_TABLET | Freq: Four times a day (QID) | ORAL | 0 refills | Status: DC | PRN

## 2019-07-16 NOTE — Progress Notes (Addendum)
Subjective:   Jeanne [name] is a 68 y.o. female who presents for Medicare Annual (Subsequent) preventive examination.  Review of Systems:  N/A  Cardiac Risk Factors include: advanced age (>47mn, >>69women);hypertension     Objective:     Vitals: BP 132/86 (BP Location: Right Arm)   Pulse 81   Temp 97.7 F (36.5 C) (Oral)   Ht '5\' 4"'  (1.626 m)   Wt 146 lb 9.6 oz (66.5 kg)   BMI 25.16 kg/m   Body mass index is 25.16 kg/m.  Advanced Directives 07/20/2019 08/04/2018 07/17/2018 06/07/2016 03/01/2015  Does Patient Have a Medical Advance Directive? No Yes Yes No No  Type of Advance Directive - Living will;Healthcare Power of [place] will - -  Does patient want to make changes to medical advance directive? - No - Patient declined - - -  Copy of [place]? - No - copy requested No - copy requested - -  Would patient like information on creating a medical advance directive? Yes (MAU/Ambulatory/Procedural Areas - Information given) - - - -    Tobacco Social History   Tobacco Use  Smoking Status Former Smoker  . Years: 20.00  . Quit date: 09/09/1989  . Years since quitting: 29.8  Smokeless Tobacco Never Used  Tobacco Comment   QUIT IN 1990     Counseling given: Not Answered Comment: QUIT IN 1990   Clinical Intake:  Pre-visit preparation completed: Yes  Pain : No/denies pain(Has chronic arthritis pains.) Pain Score: 0-No pain     Nutritional Status: BMI 25 -29 Overweight Nutritional Risks: None Diabetes: No  How often do you need to have someone help you when you read instructions, pamphlets, or other written materials from your doctor or pharmacy?: 1 - Never  Interpreter Needed?: No  Information entered by :: [facility] L[facility]  Past Medical History:  Diagnosis Date  . Arthritis    RA  . [place]   . Complication of anesthesia   . History of kidney stones   . Hypertension   . PONV  (postoperative nausea and vomiting)    Past Surgical History:  Procedure Laterality Date  . ABDOMINAL HYSTERECTOMY  09/10/2003  . CYSTOGRAM N/A 08/04/2018   Procedure: CYSTOGRAM WITH URETHAL DILATION;  Surgeon: BHollice Espy MD;  Location: [facility] ORS;  Service: Urology;  Laterality: N/A;  . CYSTOSCOPY W/ RETROGRADES Bilateral 08/04/2018   Procedure: CYSTOSCOPY WITH RETROGRADE PYELOGRAM;  Surgeon: BHollice Espy MD;  Location: [facility] ORS;  Service: Urology;  Laterality: Bilateral;  . CYSTOSCOPY/URETEROSCOPY/HOLMIUM LASER/STENT PLACEMENT Left 08/04/2018   Procedure: CYSTOSCOPY/URETEROSCOPY/HOLMIUM LASER/STENT PLACEMENT;  Surgeon: BHollice Espy MD;  Location: [facility] ORS;  Service: Urology;  Laterality: Left;  . JOINT REPLACEMENT    . TOTAL HIP ARTHROPLASTY Right 1972  . TOTAL HIP ARTHROPLASTY Left 1981  . TOTAL KNEE ARTHROPLASTY Right 1990   Family History  Problem Relation Age of Onset  . Breast cancer Mother   . Hypertension Father   . Cancer Father   . Prostate cancer Neg Hx   . Kidney cancer Neg Hx   . Bladder Cancer Neg Hx    Social History   Socioeconomic History  . Marital status: Married    Spouse name: kim  . Number of children: 0  . Years of education: Not on file  . Highest education level: Some college, no degree  Occupational History  . Occupation: disability    Comment: now on [place] .  Financial resource strain: Not hard at all  . Food insecurity    Worry: Never true    Inability: Never true  . Transportation needs    Medical: No    Non-medical: No  Tobacco Use  . Smoking status: Former Smoker    Years: 20.00    Quit date: 09/09/1989    Years since quitting: 29.8  . Smokeless tobacco: Never Used  . Tobacco comment: QUIT IN 1990  Substance and Sexual Activity  . Alcohol use: No    Alcohol/week: 0.0 standard drinks  . Drug use: No  . Sexual activity: Not on file  Lifestyle  . Physical activity    Days per week: 0 days    Minutes per  session: 0 min  . Stress: Only a little  Relationships  . Social Herbalist on phone: Patient refused    Gets together: Patient refused    Attends religious service: Patient refused    Active member of club or organization: Patient refused    Attends meetings of clubs or organizations: Patient refused    Relationship status: Patient refused  Other Topics Concern  . Not on file  Social History Narrative   Has 1 adopted daughter     Outpatient Encounter Medications as of 07/20/2019  Medication Sig  . Aspirin-Caffeine (BC FAST PAIN RELIEF PO) Take 1 Package by mouth daily as needed (headache).   . diclofenac (VOLTAREN) 75 MG EC tablet TAKE 1 TABLET BY MOUTH TWICE A DAY  . HYDROcodone-acetaminophen (NORCO) 7.5-325 MG tablet Take 1 tablet by mouth every 6 (six) hours as needed for moderate pain. Reviewed controlled substance record in PMP Aware.  . metoprolol tartrate (LOPRESSOR) 25 MG tablet Take 1 tablet (25 mg total) by mouth 2 (two) times daily.  . Probiotic Product (PROBIOTIC PO) Take by mouth daily.   Marland Kitchen CRANBERRY PO Take by mouth.  . docusate sodium (COLACE) 100 MG capsule Take 1 capsule (100 mg total) by mouth 2 (two) times daily. (Patient not taking: Reported on 07/20/2019)  . lactobacillus acidophilus (BACID) TABS tablet Take 1 tablet by mouth daily.  . mirabegron ER (MYRBETRIQ) 25 MG TB24 tablet Take 1 tablet (25 mg total) by mouth daily. (Patient not taking: Reported on 07/20/2019)  . nitrofurantoin, macrocrystal-monohydrate, (MACROBID) 100 MG capsule Take 1 capsule (100 mg total) by mouth 2 (two) times daily. (Patient not taking: Reported on 07/20/2019)  . promethazine (PHENERGAN) 25 MG tablet Take 0.5 tablets (12.5 mg total) by mouth every 6 (six) hours as needed for nausea or vomiting. (Patient not taking: Reported on 07/20/2019)   No facility-administered encounter medications on file as of 07/20/2019.     Activities of Daily Living In your present state of health, do  you have any difficulty performing the following activities: 07/20/2019 07/31/2018  Hearing? N -  Vision? N -  Difficulty concentrating or making decisions? N -  Walking or climbing stairs? Y -  Comment Due to pain with RA. -  Dressing or bathing? N N  Doing errands, shopping? N -  Preparing Food and eating ? N -  Using the Toilet? N -  In the past six months, have you accidently leaked urine? Y -  Comment Due to radiation cystitis. -  Do you have problems with loss of bowel control? N -  Managing your Medications? N -  Managing your Finances? N -  Housekeeping or managing your Housekeeping? N -  Some recent data might be hidden  Patient Care Team: Chrismon, Vickki Muff, PA as PCP - General (Physician Assistant) Anell Barr, OD (Optometry)    Assessment:   This is a routine wellness examination for [place].  Exercise Activities and Dietary recommendations Current Exercise Habits: The patient does not participate in regular exercise at present, Exercise limited by: orthopedic condition(s)  Goals    . DIET - INCREASE WATER INTAKE     Recommend to drink at least 6-8 8oz glasses of water per day.         Fall Risk: Fall Risk  07/20/2019 07/20/2019 07/17/2018 07/17/2018 04/23/2017  Falls in the past year? 0 0 0 0 No  Number falls in past yr: 0 0 - - -  Injury with Fall? 0 0 - - -    FALL RISK PREVENTION PERTAINING TO THE HOME:  Any stairs in or around the home? Yes  If so, are there any without handrails? No   Home free of loose throw rugs in walkways, pet beds, electrical cords, etc? Yes  Adequate lighting in your home to reduce risk of falls? Yes   ASSISTIVE DEVICES UTILIZED TO PREVENT FALLS:  Life alert? No  Use of a cane, walker or w/c? Yes  Grab bars in the bathroom? No  Shower chair or bench in shower? Yes  Elevated toilet seat or a handicapped toilet? Yes    TIMED UP AND GO:  Was the test performed? No .    Depression Screen PHQ 2/9 Scores 07/20/2019  07/17/2018 07/17/2018 07/17/2018  PHQ - 2 Score 0 0 0 0  PHQ- 9 Score - - 1 -     Cognitive Function: Declined today.          There is no immunization history on file for this patient.  Qualifies for Shingles Vaccine? Yes . Due for Shingrix. Education has been provided regarding the importance of this vaccine. Pt has been advised to call insurance company to determine out of pocket expense. Advised may also receive vaccine at local pharmacy or Health Dept. Verbalized acceptance and understanding.  Tdap: Although this vaccine is not a covered service during a Wellness Exam, does the patient still wish to receive this vaccine today?  No .  Education has been provided regarding the importance of this vaccine. Advised may receive this vaccine at local pharmacy or Health Dept. Aware to provide a copy of the vaccination record if obtained from local pharmacy or Health Dept. Verbalized acceptance and understanding.  Flu Vaccine: Due for Flu vaccine. Does the patient want to receive this vaccine today?  No . Education has been provided regarding the importance of this vaccine but still declined. Advised may receive this vaccine at local pharmacy or Health Dept. Aware to provide a copy of the vaccination record if obtained from local pharmacy or Health Dept. Verbalized acceptance and understanding.  Pneumococcal Vaccine: Due for Pneumococcal vaccine. Does the patient want to receive this vaccine today?  No . Education has been provided regarding the importance of this vaccine but still declined. Advised may receive this vaccine at local pharmacy or Health Dept. Aware to provide a copy of the vaccination record if obtained from local pharmacy or Health Dept. Verbalized acceptance and understanding.   Screening Tests Health Maintenance  Topic Date Due  . TETANUS/TDAP  12/26/1971  . MAMMOGRAM  12/26/2002  . COLONOSCOPY  08/05/2009  . PNA vac Low Risk Adult (1 of 2 - PCV13) 12/25/2017  . INFLUENZA  VACCINE  07/17/2024 (Originally 04/11/2019)  .  Hepatitis C Screening  Completed    Cancer Screenings:  Colorectal Screening: Completed 08/05/06. Repeat every 3 year. Received kit for cologuard and will complete and mail back to [facility].   Mammogram: Currently due. Ordered today. Pt provided with contact info and advised to call to schedule appt.   Lung Cancer Screening: (Low Dose CT Chest recommended if Age 78-80 years, 30 pack-year currently smoking OR have quit w/in 15years.) does not qualify.   Additional Screening:  Hepatitis C Screening: Up to date  Vision Screening: Recommended annual ophthalmology exams for early detection of glaucoma and other disorders of the eye.  Dental Screening: Recommended annual dental exams for proper oral hygiene  Community Resource Referral:  CRR required this visit?  No       Plan:  I have personally reviewed and addressed the Medicare Annual Wellness questionnaire and have noted the following in the patient's chart:  A. Medical and social history B. Use of alcohol, tobacco or illicit drugs  C. Current medications and supplements D. Functional ability and status E.  Nutritional status F.  Physical activity G. Advance directives H. List of other physicians I.  Hospitalizations, surgeries, and ER visits in previous 12 months J.  [place] such as hearing and vision if needed, cognitive and depression L. Referrals and appointments   In addition, I have reviewed and discussed with patient certain preventive protocols, quality metrics, and best practice recommendations. A written personalized care plan for preventive services as well as general preventive health recommendations were provided to patient. Nurse Health Advisor  Signed,    [place], [place]  54/0/0867 Nurse Health Advisor   Nurse Notes: Declined all currently due vaccines. Pt to complete cologuard kit from insurance. Mammogram ordered today.    Reviewed note and plan of Nurse Health Advisor. Agree with documentation and recommendations. Was available for consultation during screening.

## 2019-07-20 ENCOUNTER — Ambulatory Visit (INDEPENDENT_AMBULATORY_CARE_PROVIDER_SITE_OTHER): Payer: Medicare HMO | Admitting: Family Medicine

## 2019-07-20 ENCOUNTER — Other Ambulatory Visit: Payer: Self-pay

## 2019-07-20 ENCOUNTER — Ambulatory Visit (INDEPENDENT_AMBULATORY_CARE_PROVIDER_SITE_OTHER): Payer: Medicare HMO

## 2019-07-20 ENCOUNTER — Encounter: Payer: Self-pay | Admitting: Family Medicine

## 2019-07-20 VITALS — BP 132/86 | HR 81 | Temp 97.7°F | Ht 64.0 in | Wt 146.6 lb

## 2019-07-20 VITALS — BP 132/86 | HR 81 | Temp 97.7°F | Ht 64.0 in | Wt 146.0 lb

## 2019-07-20 DIAGNOSIS — R319 Hematuria, unspecified: Secondary | ICD-10-CM

## 2019-07-20 DIAGNOSIS — I10 Essential (primary) hypertension: Secondary | ICD-10-CM

## 2019-07-20 DIAGNOSIS — N390 Urinary tract infection, site not specified: Secondary | ICD-10-CM

## 2019-07-20 DIAGNOSIS — Z0000 Encounter for general adult medical examination without abnormal findings: Secondary | ICD-10-CM | POA: Diagnosis not present

## 2019-07-20 DIAGNOSIS — E782 Mixed hyperlipidemia: Secondary | ICD-10-CM

## 2019-07-20 DIAGNOSIS — Z96653 Presence of artificial knee joint, bilateral: Secondary | ICD-10-CM

## 2019-07-20 DIAGNOSIS — Z8541 Personal history of malignant neoplasm of cervix uteri: Secondary | ICD-10-CM

## 2019-07-20 DIAGNOSIS — M0579 Rheumatoid arthritis with rheumatoid factor of multiple sites without organ or systems involvement: Secondary | ICD-10-CM

## 2019-07-20 DIAGNOSIS — Z87442 Personal history of urinary calculi: Secondary | ICD-10-CM

## 2019-07-20 DIAGNOSIS — Z1231 Encounter for screening mammogram for malignant neoplasm of breast: Secondary | ICD-10-CM | POA: Diagnosis not present

## 2019-07-20 LAB — POCT URINALYSIS DIPSTICK
Bilirubin, UA: NEGATIVE
Blood, UA: POSITIVE
Glucose, UA: NEGATIVE
Ketones, UA: NEGATIVE
Nitrite, UA: POSITIVE
Protein, UA: POSITIVE — AB
Spec Grav, UA: 1.025 (ref 1.010–1.025)
Urobilinogen, UA: 0.2 E.U./dL
pH, UA: 6 (ref 5.0–8.0)

## 2019-07-20 NOTE — Progress Notes (Signed)
Patient: Jeanne [name], Female    DOB: 12-12-1950, 68 y.o.   MRN: [id number] Visit Date: 07/20/2019  Today's Provider: [name], PA   Chief Complaint  Patient presents with  . Annual Exam   Subjective:  Jeanne [name] is a 68 y.o. female who presents today for health maintenance and complete physical. She feels fairly well. She reports exercising minimally due to severe rheumatoid arthritis. She reports she is sleeping fairly well.  Review of Systems  Constitutional: Negative.   HENT: Negative.   Eyes: Negative.   Respiratory: Negative.   Cardiovascular: Negative.   Gastrointestinal: Negative.   Endocrine: Negative.   Genitourinary: Positive for dysuria, enuresis and frequency.  Musculoskeletal: Positive for arthralgias.  Skin: Negative.   Allergic/Immunologic: Negative.   Neurological: Negative.   Hematological: Negative.   Psychiatric/Behavioral: Negative.     Social History   Socioeconomic History  . Marital status: Married    Spouse name: kim  . Number of children: 0  . Years of education: Not on file  . Highest education level: Some college, no degree  Occupational History  . Occupation: disability    Comment: now on [place]  . Financial resource strain: Not hard at all  . Food insecurity    Worry: Never true    Inability: Never true  . Transportation needs    Medical: No    Non-medical: No  Tobacco Use  . Smoking status: Former Smoker    Years: 20.00    Quit date: 09/09/1989    Years since quitting: 29.8  . Smokeless tobacco: Never Used  . Tobacco comment: QUIT IN 1990  Substance and Sexual Activity  . Alcohol use: No    Alcohol/week: 0.0 standard drinks  . Drug use: No  . Sexual activity: Not on file  Lifestyle  . Physical activity    Days per week: 0 days    Minutes per session: 0 min  . Stress: Only a little  Relationships  . Social Herbalist on phone: Patient refused    Gets together: Patient refused     Attends religious service: Patient refused    Active member of club or organization: Patient refused    Attends meetings of clubs or organizations: Patient refused    Relationship status: Patient refused  . Intimate partner violence    Fear of current or ex partner: Patient refused    Emotionally abused: Patient refused    Physically abused: Patient refused    Forced sexual activity: Patient refused  Other Topics Concern  . Not on file  Social History Narrative   Has 1 adopted daughter     Patient Active Problem List   Diagnosis Date Noted  . Recurrent UTI 07/15/2017  . Dysuria 01/28/2017  . Chronic radiation cystitis 02/28/2015  . Coitalgia 02/28/2015  . Blood pressure elevated 02/28/2015  . Calculus of kidney 02/28/2015  . Arthritis or polyarthritis, rheumatoid ([place]) 02/28/2015  . Basal cell papilloma 02/28/2015  . H/O cataract extraction 09/22/2014  . Combined form of senile cataract 07/07/2014  . NS (nuclear sclerosis) 05/21/2014  . History of repair of hip joint 03/16/2013  . H/O total knee replacement 03/16/2013  . 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 ABDOMINAL HYSTERECTOMY  09/10/2003  . CYSTOGRAM N/A 08/04/2018   Procedure: CYSTOGRAM WITH URETHAL DILATION;  Surgeon: Hollice Espy, MD;  Location: [facility] ORS;  Service: Urology;  Laterality: N/A;  .  CYSTOSCOPY W/ RETROGRADES Bilateral 08/04/2018   Procedure: CYSTOSCOPY WITH RETROGRADE PYELOGRAM;  Surgeon: Hollice Espy, MD;  Location: [facility] ORS;  Service: Urology;  Laterality: Bilateral;  . CYSTOSCOPY/URETEROSCOPY/HOLMIUM LASER/STENT PLACEMENT Left 08/04/2018   Procedure: CYSTOSCOPY/URETEROSCOPY/HOLMIUM LASER/STENT PLACEMENT;  Surgeon: Hollice Espy, MD;  Location: [facility] ORS;  Service: Urology;  Laterality: Left;  . JOINT REPLACEMENT    . TOTAL HIP ARTHROPLASTY Right 1972  . TOTAL HIP ARTHROPLASTY Left 1981  . TOTAL KNEE ARTHROPLASTY Right 1990     Her family history includes Breast cancer in her mother; Cancer in her father; Hypertension in her father.     Outpatient Encounter Medications as of 07/20/2019  Medication Sig  . Aspirin-Caffeine (BC FAST PAIN RELIEF PO) Take 1 Package by mouth daily as needed (headache).   . CRANBERRY PO Take by mouth.  . diclofenac (VOLTAREN) 75 MG EC tablet TAKE 1 TABLET BY MOUTH TWICE A DAY  . HYDROcodone-acetaminophen (NORCO) 7.5-325 MG tablet Take 1 tablet by mouth every 6 (six) hours as needed for moderate pain. Reviewed controlled substance record in PMP Aware.  . lactobacillus acidophilus (BACID) TABS tablet Take 1 tablet by mouth daily.  . metoprolol tartrate (LOPRESSOR) 25 MG tablet Take 1 tablet (25 mg total) by mouth 2 (two) times daily.  . Probiotic Product (PROBIOTIC PO) Take by mouth daily.   Marland Kitchen docusate sodium (COLACE) 100 MG capsule Take 1 capsule (100 mg total) by mouth 2 (two) times daily. (Patient not taking: Reported on 07/20/2019)  . mirabegron ER (MYRBETRIQ) 25 MG TB24 tablet Take 1 tablet (25 mg total) by mouth daily. (Patient not taking: Reported on 07/20/2019)  . nitrofurantoin, macrocrystal-monohydrate, (MACROBID) 100 MG capsule Take 1 capsule (100 mg total) by mouth 2 (two) times daily. (Patient not taking: Reported on 07/20/2019)  . promethazine (PHENERGAN) 25 MG tablet Take 0.5 tablets (12.5 mg total) by mouth every 6 (six) hours as needed for nausea or vomiting. (Patient not taking: Reported on 07/20/2019)   No facility-administered encounter medications on file as of 07/20/2019.     Patient Care Team: Chrismon, Vickki Muff, PA as PCP - General (Physician Assistant) Anell Barr, OD (Optometry)      Objective:   Vitals:  Vitals:   07/20/19 1037  BP: 132/86  Pulse: 81  Temp: 97.7 F (36.5 C)  TempSrc: Skin  Weight: 146 lb (66.2 kg)  Height: 5\' 4"  (1.626 m)   Physical Exam Constitutional:      Appearance: She is well-developed.  HENT:     Head: Normocephalic and  atraumatic.     Right Ear: External ear normal.     Left Ear: External ear normal.     Nose: Nose normal.  Eyes:     General:        Right eye: No discharge.     Conjunctiva/sclera: Conjunctivae normal.     Pupils: Pupils are equal, round, and reactive to light.  Neck:     Thyroid: No thyromegaly.     Trachea: No tracheal deviation.  Cardiovascular:     Rate and Rhythm: Normal rate and regular rhythm.     Heart sounds: Normal heart sounds. No murmur.  Pulmonary:     Effort: Pulmonary effort is normal. No respiratory distress.     Breath sounds: Normal breath sounds. No wheezing or rales.  Chest:     Chest wall: No tenderness.  Abdominal:     General: There is no distension.     Palpations: Abdomen is soft. There is no  mass.     Tenderness: There is no abdominal tenderness. There is no guarding or rebound.  Genitourinary:    Comments: Hysterectomy in 2004 due to cervical cancer. Scarring from chemotherapy and radiation treatment does not allow vaginal exam.  Musculoskeletal:     Cervical back: Normal range of motion and neck supple.     Comments: Gait and Station - Slow stiff tottering gait.  Spine, Ribs and Pelvis - Some discomfort in lumbar spine. Mild tenderness without deformities. Very stiff and limited ROM.  Right Upper Extremity - Ulnar deviation of fingers at MCP. Very limited ability to grip. Unable to bend finger joints. Stiffness of shoulders and elbows.  Left Upper Extremity - Ulnar deviation of MCP joints with straight stiff fingers. Unable to flex into a fist and tender to palpate. Stiffness of shoulders and elbows. Wrists stiff and slightly enlarged.  Right Lower Extremity - Hip very stiff with history of joint replacement and knee replacement. Tender to palpate.  Left Lower Extremity - Decrease in hip ROM. Pain with crepitus of the left knee. Decrease ROM (flex only to 80 degrees and extend to 170 degrees).Upper Extremity - Nodules on elbows. Ulnar deviation of fingers -  both hands - with large MCP joints. Extremely diminished ability to flex or extend fingers. Stiffness and tenderness of both ankles. Normal symmetric pulses.   Lymphadenopathy:     Cervical: No cervical adenopathy.  Skin:    General: Skin is warm and dry.     Findings: No erythema or rash.  Neurological:     Mental Status: She is alert and oriented to person, place, and time.     Cranial Nerves: No cranial nerve deficit.     Motor: No abnormal muscle tone.     Coordination: Coordination normal.     Deep Tendon Reflexes: Reflexes are normal and symmetric. Reflexes normal.  Psychiatric:        Behavior: Behavior normal.        Thought Content: Thought content normal.        Judgment: Judgment normal.      Depression Screen PHQ 2/9 Scores 07/20/2019 07/17/2018 07/17/2018 07/17/2018  PHQ - 2 Score 0 0 0 0  PHQ- 9 Score - - 1 -      Assessment & Plan:     Routine Health Maintenance and Physical Exam  Exercise Activities and Dietary recommendations Goals    . DIET - INCREASE WATER INTAKE     Recommend to drink at least 6-8 8oz glasses of water per day.          There is no immunization history on file for this patient.  Health Maintenance  Topic Date Due  . TETANUS/TDAP  12/26/1971  . MAMMOGRAM  12/26/2002  . COLONOSCOPY  08/05/2009  . PNA vac Low Risk Adult (1 of 2 - PCV13) 12/25/2017  . INFLUENZA VACCINE  07/17/2024 (Originally 04/11/2019)  . Hepatitis C Screening  Completed     Discussed health benefits of physical activity, and encouraged her to engage in regular exercise appropriate for her age and condition.   1. Annual physical exam General health stable with severe joint disease from rheumatoid arthritis.  2. Urinary tract infection with hematuria, site unspecified Recurrence of odor in urine and frequent UTI's since radiation cystitis after cervical cancer and hysterectomy in 2004. Recheck urinalysis showed positive nitrites. Will check labs and urine  C&S. - POCT Urinalysis Dipstick - Urine Culture - CBC with Differential - Comprehensive Metabolic Panel (CMET)  3. Hx of renal calculi No hematuria or flank pain. Recheck CMP and may need follow up with Dr. Erlene Quan (urologist) if discomfort develops. - Comprehensive Metabolic Panel (CMET)  4. Mixed hyperlipidemia Not on statins at the present. Should follow low fat diet. Arthritis does no allow for much exercise. Recheck labs. - Comprehensive Metabolic Panel (CMET) - Lipid Profile - TSH  5. Rheumatoid arthritis involving multiple sites with positive rheumatoid factor (HCC) Onset as a child. All joints very stiff with deformities and waddling gait with short stride. Use of Norco 30 tablets/month and Voltaren-EC helps control pains. - CBC with Differential - Comprehensive Metabolic Panel (CMET)  6. History of total bilateral knee replacement Had right hip arthroplasty in 1981 and left hip arthroplasty in 1990. Still very stiff and waddling gait. Can't stand long without resting and uses a motorized chair to get around when shopping or for long distance ambulation  7. History of cervical cancer Diagnosed and had hysterectomy with chemotherapy and radiation for 6 weeks in 2004. Adenosquamous carcinoma of cervix, Stage I-B, Grade 1.  8. Essential hypertension Good control of BP on the Metoprolol Tartrate 25 mg BID without side effects. Recheck routine labs. - CBC with Differential - Comprehensive Metabolic Panel (CMET) - Lipid Profile - TSH

## 2019-07-20 NOTE — Patient Instructions (Signed)
Jeanne [name] , Thank you for taking time to come for your Medicare Wellness Visit. I appreciate your ongoing commitment to your health goals. Please review the following plan we discussed and let me know if I can assist you in the future.   Screening recommendations/referrals: Colonoscopy: Pt to complete cologuard. Mammogram: Ordered today. Pt provided with contact info and advised to call to schedule appt.  Bone Density: Not required.  Recommended yearly ophthalmology/optometry visit for glaucoma screening and checkup Recommended yearly dental visit for hygiene and checkup  Vaccinations: Influenza vaccine: Pt declines today.  Pneumococcal vaccine: Pt declines today.  Tdap vaccine: Pt declines today.  Shingles vaccine: Pt declines today.     Advanced directives: Advance directive discussed with you today. I have provided a copy for you to complete at home and have notarized. Once this is complete please bring a copy in to our office so we can scan it into your chart.  Conditions/risks identified: Continue to increase water intake to 6-8 8 oz glasses of water daily.   Next appointment: 10:40 AM with Jeanne [name]   Preventive Care 67 Years and Older, Female Preventive care refers to lifestyle choices and visits with your health care provider that can promote health and wellness. What does preventive care include?  A yearly physical exam. This is also called an annual well check.  Dental exams once or twice a year.  Routine eye exams. Ask your health care provider how often you should have your eyes checked.  Personal lifestyle choices, including:  Daily care of your teeth and gums.  Regular physical activity.  Eating a healthy diet.  Avoiding tobacco and drug use.  Limiting alcohol use.  Practicing safe sex.  Taking low-dose aspirin every day.  Taking vitamin and mineral supplements as recommended by your health care provider. What happens during an annual well check?  The services and screenings done by your health care provider during your annual well check will depend on your age, overall health, lifestyle risk factors, and family history of disease. Counseling  Your health care provider may ask you questions about your:  Alcohol use.  Tobacco use.  Drug use.  Emotional well-being.  Home and relationship well-being.  Sexual activity.  Eating habits.  History of falls.  Memory and ability to understand (cognition).  Work and work Statistician.  Reproductive health. Screening  You may have the following tests or measurements:  Height, weight, and BMI.  Blood pressure.  Lipid and cholesterol levels. These may be checked every 5 years, or more frequently if you are over 63 years old.  Skin check.  Lung cancer screening. You may have this screening every year starting at age 56 if you have a 30-pack-year history of smoking and currently smoke or have quit within the past 15 years.  Fecal occult blood test (FOBT) of the stool. You may have this test every year starting at age 49.  Flexible sigmoidoscopy or colonoscopy. You may have a sigmoidoscopy every 5 years or a colonoscopy every 10 years starting at age 7.  Hepatitis C blood test.  Hepatitis B blood test.  Sexually transmitted disease (STD) testing.  Diabetes screening. This is done by checking your blood sugar (glucose) after you have not eaten for a while (fasting). You may have this done every 1-3 years.  Bone density scan. This is done to screen for osteoporosis. You may have this done starting at age 57.  Mammogram. This may be done every 1-2 years. Talk to  your health care provider about how often you should have regular mammograms. Talk with your health care provider about your test results, treatment options, and if necessary, the need for more tests. Vaccines  Your health care provider may recommend certain vaccines, such as:  Influenza vaccine. This is  recommended every year.  Tetanus, diphtheria, and acellular pertussis (Tdap, Td) vaccine. You may need a Td booster every 10 years.  Zoster vaccine. You may need this after age 62.  Pneumococcal 13-valent conjugate (PCV13) vaccine. One dose is recommended after age 72.  Pneumococcal polysaccharide (PPSV23) vaccine. One dose is recommended after age 74. Talk to your health care provider about which screenings and vaccines you need and how often you need them. This information is not intended to replace advice given to you by your health care provider. Make sure you discuss any questions you have with your health care provider. Document Released: 09/23/2015 Document Revised: 05/16/2016 Document Reviewed: 06/28/2015 Elsevier Interactive Patient Education  2017 [place] Prevention in the Home Falls can cause injuries. They can happen to people of all ages. There are many things you can do to make your home safe and to help prevent falls. What can I do on the outside of my home?  Regularly fix the edges of walkways and driveways and fix any cracks.  Remove anything that might make you trip as you walk through a door, such as a raised step or threshold.  Trim any bushes or trees on the path to your home.  Use bright outdoor lighting.  Clear any walking paths of anything that might make someone trip, such as rocks or tools.  Regularly check to see if handrails are loose or broken. Make sure that both sides of any steps have handrails.  Any raised decks and porches should have guardrails on the edges.  Have any leaves, snow, or ice cleared regularly.  Use sand or salt on walking paths during winter.  Clean up any spills in your garage right away. This includes oil or grease spills. What can I do in the bathroom?  Use night lights.  Install grab bars by the toilet and in the tub and shower. Do not use towel bars as grab bars.  Use non-skid mats or decals in the tub or  shower.  If you need to sit down in the shower, use a plastic, non-slip stool.  Keep the floor dry. Clean up any water that spills on the floor as soon as it happens.  Remove soap buildup in the tub or shower regularly.  Attach bath mats securely with double-sided non-slip rug tape.  Do not have throw rugs and other things on the floor that can make you trip. What can I do in the bedroom?  Use night lights.  Make sure that you have a light by your bed that is easy to reach.  Do not use any sheets or blankets that are too big for your bed. They should not hang down onto the floor.  Have a firm chair that has side arms. You can use this for support while you get dressed.  Do not have throw rugs and other things on the floor that can make you trip. What can I do in the kitchen?  Clean up any spills right away.  Avoid walking on wet floors.  Keep items that you use a lot in easy-to-reach places.  If you need to reach something above you, use a strong step stool that has  a grab bar.  Keep electrical cords out of the way.  Do not use floor polish or wax that makes floors slippery. If you must use wax, use non-skid floor wax.  Do not have throw rugs and other things on the floor that can make you trip. What can I do with my stairs?  Do not leave any items on the stairs.  Make sure that there are handrails on both sides of the stairs and use them. Fix handrails that are broken or loose. Make sure that handrails are as long as the stairways.  Check any carpeting to make sure that it is firmly attached to the stairs. Fix any carpet that is loose or worn.  Avoid having throw rugs at the top or bottom of the stairs. If you do have throw rugs, attach them to the floor with carpet tape.  Make sure that you have a light switch at the top of the stairs and the bottom of the stairs. If you do not have them, ask someone to add them for you. What else can I do to help prevent falls?   Wear shoes that:  Do not have high heels.  Have rubber bottoms.  Are comfortable and fit you well.  Are closed at the toe. Do not wear sandals.  If you use a stepladder:  Make sure that it is fully opened. Do not climb a closed stepladder.  Make sure that both sides of the stepladder are locked into place.  Ask someone to hold it for you, if possible.  Clearly mark and make sure that you can see:  Any grab bars or handrails.  First and last steps.  Where the edge of each step is.  Use tools that help you move around (mobility aids) if they are needed. These include:  Canes.  Walkers.  Scooters.  Crutches.  Turn on the lights when you go into a dark area. Replace any light bulbs as soon as they burn out.  Set up your furniture so you have a clear path. Avoid moving your furniture around.  If any of your floors are uneven, fix them.  If there are any pets around you, be aware of where they are.  Review your medicines with your doctor. Some medicines can make you feel dizzy. This can increase your chance of falling. Ask your doctor what other things that you can do to help prevent falls. This information is not intended to replace advice given to you by your health care provider. Make sure you discuss any questions you have with your health care provider. Document Released: 06/23/2009 Document Revised: 02/02/2016 Document Reviewed: 10/01/2014 Elsevier Interactive Patient Education  2017 [employer].

## 2019-07-21 ENCOUNTER — Other Ambulatory Visit: Payer: Self-pay

## 2019-07-21 DIAGNOSIS — M0579 Rheumatoid arthritis with rheumatoid factor of multiple sites without organ or systems involvement: Secondary | ICD-10-CM

## 2019-07-21 MED ORDER — HYDROCODONE-ACETAMINOPHEN 7.5-325 MG PO TABS: 1.0000 | ORAL_TABLET | Freq: Four times a day (QID) | ORAL | 0 refills | Status: DC | PRN

## 2019-07-22 ENCOUNTER — Encounter: Payer: Self-pay | Admitting: Family Medicine

## 2019-07-22 LAB — URINE CULTURE

## 2019-07-23 ENCOUNTER — Other Ambulatory Visit: Payer: Self-pay | Admitting: Family Medicine

## 2019-07-23 DIAGNOSIS — N390 Urinary tract infection, site not specified: Secondary | ICD-10-CM

## 2019-07-23 MED ORDER — NITROFURANTOIN MONOHYD MACRO 100 MG PO CAPS: 100.0000 mg | ORAL_CAPSULE | Freq: Two times a day (BID) | ORAL | 0 refills | Status: DC

## 2019-08-10 ENCOUNTER — Encounter: Payer: Self-pay | Admitting: Family Medicine

## 2019-08-11 ENCOUNTER — Other Ambulatory Visit: Payer: Self-pay | Admitting: Family Medicine

## 2019-08-11 DIAGNOSIS — M0579 Rheumatoid arthritis with rheumatoid factor of multiple sites without organ or systems involvement: Secondary | ICD-10-CM

## 2019-08-11 DIAGNOSIS — N390 Urinary tract infection, site not specified: Secondary | ICD-10-CM

## 2019-08-11 MED ORDER — HYDROCODONE-ACETAMINOPHEN 7.5-325 MG PO TABS: 1.0000 | ORAL_TABLET | Freq: Four times a day (QID) | ORAL | 0 refills | Status: DC | PRN

## 2019-08-11 MED ORDER — NITROFURANTOIN MONOHYD MACRO 100 MG PO CAPS: 100.0000 mg | ORAL_CAPSULE | Freq: Every day | ORAL | 0 refills | Status: DC

## 2019-08-16 ENCOUNTER — Encounter: Payer: Self-pay | Admitting: Family Medicine

## 2019-08-17 ENCOUNTER — Encounter: Payer: Self-pay | Admitting: Family Medicine

## 2019-08-17 ENCOUNTER — Other Ambulatory Visit (INDEPENDENT_AMBULATORY_CARE_PROVIDER_SITE_OTHER): Payer: Medicare HMO | Admitting: Family Medicine

## 2019-08-17 DIAGNOSIS — N390 Urinary tract infection, site not specified: Secondary | ICD-10-CM | POA: Diagnosis not present

## 2019-08-17 LAB — POCT URINALYSIS DIPSTICK
Bilirubin, UA: NEGATIVE
Glucose, UA: NEGATIVE
Ketones, UA: NEGATIVE
Nitrite, UA: NEGATIVE
Protein, UA: POSITIVE — AB
Spec Grav, UA: <=1.005 — AB (ref 1.010–1.025)
Urobilinogen, UA: 1 E.U./dL
pH, UA: 8.5 — AB (ref 5.0–8.0)

## 2019-08-20 ENCOUNTER — Other Ambulatory Visit: Payer: Self-pay

## 2019-08-20 DIAGNOSIS — N3040 Irradiation cystitis without hematuria: Secondary | ICD-10-CM

## 2019-08-20 MED ORDER — AMOXICILLIN-POT CLAVULANATE 875-125 MG PO TABS: 1.0000 | ORAL_TABLET | Freq: Two times a day (BID) | ORAL | 0 refills | Status: DC

## 2019-08-21 LAB — URINE CULTURE

## 2019-08-24 ENCOUNTER — Encounter: Payer: Self-pay | Admitting: Family Medicine

## 2019-08-25 NOTE — Telephone Encounter (Signed)
Patient refused in person ov. Scheduled telephone visit for pt.

## 2019-08-25 NOTE — Telephone Encounter (Signed)
Needs appt with someone--no room on my schedule.

## 2019-08-26 ENCOUNTER — Encounter: Payer: Self-pay | Admitting: Adult Health

## 2019-08-26 ENCOUNTER — Other Ambulatory Visit: Payer: Self-pay

## 2019-08-26 ENCOUNTER — Ambulatory Visit (INDEPENDENT_AMBULATORY_CARE_PROVIDER_SITE_OTHER): Payer: Medicare HMO | Admitting: Adult Health

## 2019-08-26 DIAGNOSIS — B029 Zoster without complications: Secondary | ICD-10-CM | POA: Diagnosis not present

## 2019-08-26 DIAGNOSIS — N390 Urinary tract infection, site not specified: Secondary | ICD-10-CM

## 2019-08-26 DIAGNOSIS — Z789 Other specified health status: Secondary | ICD-10-CM | POA: Diagnosis not present

## 2019-08-26 MED ORDER — VALACYCLOVIR HCL 1 G PO TABS: 1000.0000 mg | ORAL_TABLET | Freq: Two times a day (BID) | ORAL | 0 refills | Status: DC

## 2019-08-26 MED ORDER — CIPROFLOXACIN HCL 500 MG PO TABS: 500.0000 mg | ORAL_TABLET | Freq: Two times a day (BID) | ORAL | 0 refills | Status: DC

## 2019-08-26 NOTE — Progress Notes (Addendum)
Virtual Visit via Telephone Note  I connected with Jeanne [name] on 08/26/19 at  3:40 PM EST by telephone and verified that I am speaking with the correct person using two identifiers.  Location: Patient: at home  Provider: Provider: Provider's office at  [facility], [place].      I discussed the limitations, risks, security and privacy concerns of performing an evaluation and management service by telephone and the availability of in person appointments. I also discussed with the patient that there may be a patient responsible charge related to this service. The patient expressed understanding and agreed to proceed.   History of Present Illness: She declined coming into the office for a evaluation due to Covid 19. She is aware if it does not improve she should be seen in person.   Rash on the left hand present x 6  days ago.   Denies any injury. She is on Augmentin for her urinary tract infection.  She started Augmentin on Friday 08/21/2019- she has the rash prior to starting Augmentin    She reports she is afebrile.   Rash started with itching, burning and pain on left hand. Now has blisters. She has been using antibiotic cream and covering with bandage.  She did have a fever blister resolved - she had last week. She has not had any in years until; that one.  She has no pain or burning now. Hurts to touch.  Denies any spreading erythema. She denies any other rash, no genital or facial involvement.   She also was is saying that the Augmentin she started for her urinary tract infection she is on makes her feel very nauseated to the point she can not take it.  She requests Cipro if it is ok with urine culture.  She denies any difficulty swallowing or throat/ facial swelling.   Patient  denies any fever, body aches,chills, chest pain, shortness of breath, nausea, vomiting, or diarrhea.   History of bladder cancer treatments completed a few years ago.    Observations/Objective:   Patient is alert and oriented and responsive to questions Engages in conversation with provider. Speaks in full sentences without any pauses without any shortness of breath or distress.   Se picture under media with picture and description suspect herpes zoster.    Assessment and Plan:  1. Antibiotic drug intolerance Medications Discontinued During This Encounter  Medication Reason  . amoxicillin-clavulanate (AUGMENTIN) 875-125 MG tablet Completed Course    - ciprofloxacin (CIPRO) 500 MG tablet; Take 1 tablet (500 mg total) by mouth 2 (two) times daily.  Dispense: 20 tablet; Refill: 0  2. Herpes zoster without complication Discussed signs and symptoms and care. After visit summary sent to the [facility] as well. RED Flags discussed.  Meds ordered this encounter  Medications  . valACYclovir (VALTREX) 1000 MG tablet    Sig: Take 1 tablet (1,000 mg total) by mouth 2 (two) times daily.    Dispense:  14 tablet    Refill:  0     3. Recurrent UTI Reviewed urine culture switched to Cipro she says she has taken without difficulty, discontinue Augmentin. Also discussed black box warning for C-Diff and tendon rupture. Patient verbalized understanding of all instructions given and denies any further questions at this time.    - ciprofloxacin (CIPRO) 500 MG tablet; Take 1 tablet (500 mg total) by mouth 2 (two) times daily.  Dispense: 20 tablet; Refill: 0   Follow Up Instructions:    I  discussed the assessment and treatment plan with the patient. The patient was provided an opportunity to ask questions and all were answered. The patient agreed with the plan and demonstrated an understanding of the instructions.   The patient was advised to call back or seek an in-person evaluation if the symptoms worsen or if the condition fails to improve as anticipated.  I provided 15 minutes of non-face-to-face time during this encounter.  Advised patient call the office  or your primary care doctor for an appointment if no improvement within 72 hours or if any symptoms change or worsen at any time  Advised ER or urgent Care if after hours or on weekend. Call 911 for emergency symptoms at any time.Patinet verbalized understanding of all instructions given/reviewed and treatment plan and has no further questions or concerns at this time.     Marcille Buffy, FNP

## 2019-08-26 NOTE — Patient Instructions (Signed)
Shingles  Shingles, which is also known as herpes zoster, is an infection that causes a painful skin rash and fluid-filled blisters. It is caused by a virus. Shingles only develops in people who:  Have had chickenpox.  Have been given a medicine to protect against chickenpox (have been vaccinated). Shingles is rare in this group. What are the causes? Shingles is caused by varicella-zoster virus (VZV). This is the same virus that causes chickenpox. After a person is exposed to VZV, the virus stays in the body in an inactive (dormant) state. Shingles develops if the virus is reactivated. This can happen many years after the first (initial) exposure to VZV. It is not known what causes this virus to be reactivated. What increases the risk? People who have had chickenpox or received the chickenpox vaccine are at risk for shingles. Shingles infection is more common in people who:  Are older than age 60.  Have a weakened disease-fighting system (immune system), such as people with: ? HIV. ? AIDS. ? Cancer.  Are taking medicines that weaken the immune system, such as transplant medicines.  Are experiencing a lot of stress. What are the signs or symptoms? Early symptoms of this condition include itching, tingling, and pain in an area on your skin. Pain may be described as burning, stabbing, or throbbing. A few days or weeks after early symptoms start, a painful red rash appears. The rash is usually on one side of the body and has a band-like or belt-like pattern. The rash eventually turns into fluid-filled blisters that break open, change into scabs, and dry up in about 2-3 weeks. At any time during the infection, you may also develop:  A fever.  Chills.  A headache.  An upset stomach. How is this diagnosed? This condition is diagnosed with a skin exam. Skin or fluid samples may be taken from the blisters before a diagnosis is made. These samples are examined under a microscope or sent to  a lab for testing. How is this treated? The rash may last for several weeks. There is not a specific cure for this condition. Your health care provider will probably prescribe medicines to help you manage pain, recover more quickly, and avoid long-term problems. Medicines may include:  Antiviral drugs.  Anti-inflammatory drugs.  Pain medicines.  Anti-itching medicines (antihistamines). If the area involved is on your face, you may be referred to a specialist, such as an eye doctor (ophthalmologist) or an ear, nose, and throat (ENT) doctor (otolaryngologist) to help you avoid eye problems, chronic pain, or disability. Follow these instructions at home: Medicines  Take over-the-counter and prescription medicines only as told by your health care provider.  Apply an anti-itch cream or numbing cream to the affected area as told by your health care provider. Relieving itching and discomfort   Apply cold, wet cloths (cold compresses) to the area of the rash or blisters as told by your health care provider.  Cool baths can be soothing. Try adding baking soda or dry oatmeal to the water to reduce itching. Do not bathe in hot water. Blister and rash care  Keep your rash covered with a loose bandage (dressing). Wear loose-fitting clothing to help ease the pain of material rubbing against the rash.  Keep your rash and blisters clean by washing the area with mild soap and cool water as told by your health care provider.  Check your rash every day for signs of infection. Check for: ? More redness, swelling, or pain. ? Fluid   or blood. ? Warmth. ? Pus or a bad smell.  Do not scratch your rash or pick at your blisters. To help avoid scratching: ? Keep your fingernails clean and cut short. ? Wear gloves or mittens while you sleep, if scratching is a problem. General instructions  Rest as told by your health care provider.  Keep all follow-up visits as told by your health care provider. This  is important.  Wash your hands often with soap and water. If soap and water are not available, use hand sanitizer. Doing this lowers your chance of getting a bacterial skin infection.  Before your blisters change into scabs, your shingles infection can cause chickenpox in people who have never had it or have never been vaccinated against it. To prevent this from happening, avoid contact with other people, especially: ? Babies. ? Pregnant women. ? Children who have eczema. ? Elderly people who have transplants. ? People who have chronic illnesses, such as cancer or AIDS. Contact a health care provider if:  Your pain is not relieved with prescribed medicines.  Your pain does not get better after the rash heals.  You have signs of infection in the rash area, such as: ? More redness, swelling, or pain around the rash. ? Fluid or blood coming from the rash. ? The rash area feeling warm to the touch. ? Pus or a bad smell coming from the rash. Get help right away if:  The rash is on your face or nose.  You have facial pain, pain around your eye area, or loss of feeling on one side of your face.  You have difficulty seeing.  You have ear pain or have ringing in your ear.  You have a loss of taste.  Your condition gets worse. Summary  Shingles, which is also known as herpes zoster, is an infection that causes a painful skin rash and fluid-filled blisters.  This condition is diagnosed with a skin exam. Skin or fluid samples may be taken from the blisters and examined before the diagnosis is made.  Keep your rash covered with a loose bandage (dressing). Wear loose-fitting clothing to help ease the pain of material rubbing against the rash.  Before your blisters change into scabs, your shingles infection can cause chickenpox in people who have never had it or have never been vaccinated against it. This information is not intended to replace advice given to you by your health care  provider. Make sure you discuss any questions you have with your health care provider. Document Released: 08/27/2005 Document Revised: 12/19/2018 Document Reviewed: 05/01/2017 Elsevier Patient Education  [place]. Valacyclovir caplets What is this medicine? VALACYCLOVIR (val ay SYE kloe veer) is an antiviral medicine. It is used to treat or prevent infections caused by certain kinds of viruses. Examples of these infections include herpes and shingles. This medicine will not cure herpes. This medicine may be used for other purposes; ask your health care provider or pharmacist if you have questions. COMMON BRAND NAME(S): Valtrex What should I tell my health care provider before I take this medicine? They need to know if you have any of these conditions:  acquired immunodeficiency syndrome (AIDS)  any other condition that may weaken the immune system  bone marrow or kidney transplant  kidney disease  an unusual or allergic reaction to valacyclovir, acyclovir, ganciclovir, valganciclovir, other medicines, foods, dyes, or preservatives  pregnant or trying to get pregnant  breast-feeding How should I use this medicine? Take this medicine by  mouth with a glass of water. Follow the directions on the prescription label. You can take this medicine with or without food. Take your doses at regular intervals. Do not take your medicine more often than directed. Finish the full course prescribed by your doctor or health care professional even if you think your condition is better. Do not stop taking except on the advice of your doctor or health care professional. Talk to your pediatrician regarding the use of this medicine in children. While this drug may be prescribed for children as young as 2 years for selected conditions, precautions do apply. Overdosage: If you think you have taken too much of this medicine contact a poison control center or emergency room at once. NOTE: This medicine is  only for you. Do not share this medicine with others. What if I miss a dose? If you miss a dose, take it as soon as you can. If it is almost time for your next dose, take only that dose. Do not take double or extra doses. What may interact with this medicine? Do not take this medicine with any of the following medications:  cidofovir This medicine may also interact with the following medications:  adefovir  amphotericin B  certain antibiotics like amikacin, gentamicin, tobramycin, vancomycin  cimetidine  cisplatin  colistin  cyclosporine  foscarnet  lithium  methotrexate  probenecid  tacrolimus This list may not describe all possible interactions. Give your health care provider a list of all the medicines, herbs, non-prescription drugs, or dietary supplements you use. Also tell them if you smoke, drink alcohol, or use illegal drugs. Some items may interact with your medicine. What should I watch for while using this medicine? Tell your doctor or health care professional if your symptoms do not start to get better after 1 week. This medicine works best when taken early in the course of an infection, within the first 48 hours. Begin treatment as soon as possible after the first signs of infection like tingling, itching, or pain in the affected area. It is possible that genital herpes may still be spread even when you are not having symptoms. Always use safer sex practices like condoms made of latex or polyurethane whenever you have sexual contact. You should stay well hydrated while taking this medicine. Drink plenty of fluids. What side effects may I notice from receiving this medicine? Side effects that you should report to your doctor or health care professional as soon as possible:  allergic reactions like skin rash, itching or hives, swelling of the face, lips, or tongue  aggressive behavior  confusion  hallucinations  problems with balance, talking,  walking  stomach pain  tremor  trouble passing urine or change in the amount of urine Side effects that usually do not require medical attention (report to your doctor or health care professional if they continue or are bothersome):  dizziness  headache  nausea, vomiting This list may not describe all possible side effects. Call your doctor for medical advice about side effects. You may report side effects to FDA at 1-800-FDA-1088. Where should I keep my medicine? Keep out of the reach of children. Store at room temperature between 15 and 25 degrees C (59 and 77 degrees F). Keep container tightly closed. Throw away any unused medicine after the expiration date. NOTE: This sheet is a summary. It may not cover all possible information. If you have questions about this medicine, talk to your doctor, pharmacist, or health care provider.  2020 Elsevier/Gold  Standard (2018-09-23 12:22:33) Urinary Tract Infection, Adult A urinary tract infection (UTI) is an infection of any part of the urinary tract. The urinary tract includes:  The kidneys.  The ureters.  The bladder.  The urethra. These organs make, store, and get rid of pee (urine) in the body. What are the causes? This is caused by germs (bacteria) in your genital area. These germs grow and cause swelling (inflammation) of your urinary tract. What increases the risk? You are more likely to develop this condition if:  You have a small, thin tube (catheter) to drain pee.  You cannot control when you pee or poop (incontinence).  You are female, and: ? You use these methods to prevent pregnancy: ? A medicine that kills sperm (spermicide). ? A device that blocks sperm (diaphragm). ? You have low levels of a female hormone (estrogen). ? You are pregnant.  You have genes that add to your risk.  You are sexually active.  You take antibiotic medicines.  You have trouble peeing because of: ? A prostate that is bigger than  normal, if you are female. ? A blockage in the part of your body that drains pee from the bladder (urethra). ? A kidney stone. ? A nerve condition that affects your bladder (neurogenic bladder). ? Not getting enough to drink. ? Not peeing often enough.  You have other conditions, such as: ? Diabetes. ? A weak disease-fighting system (immune system). ? Sickle cell disease. ? Gout. ? Injury of the spine. What are the signs or symptoms? Symptoms of this condition include:  Needing to pee right away (urgently).  Peeing often.  Peeing small amounts often.  Pain or burning when peeing.  Blood in the pee.  Pee that smells bad or not like normal.  Trouble peeing.  Pee that is cloudy.  Fluid coming from the vagina, if you are female.  Pain in the belly or lower back. Other symptoms include:  Throwing up (vomiting).  No urge to eat.  Feeling mixed up (confused).  Being tired and grouchy (irritable).  A fever.  Watery poop (diarrhea). How is this treated? This condition may be treated with:  Antibiotic medicine.  Other medicines.  Drinking enough water. Follow these instructions at home:  Medicines  Take over-the-counter and prescription medicines only as told by your doctor.  If you were prescribed an antibiotic medicine, take it as told by your doctor. Do not stop taking it even if you start to feel better. General instructions  Make sure you: ? Pee until your bladder is empty. ? Do not hold pee for a long time. ? Empty your bladder after sex. ? Wipe from front to back after pooping if you are a female. Use each tissue one time when you wipe.  Drink enough fluid to keep your pee pale yellow.  Keep all follow-up visits as told by your doctor. This is important. Contact a doctor if:  You do not get better after 1-2 days.  Your symptoms go away and then come back. Get help right away if:  You have very bad back pain.  You have very bad pain in your  lower belly.  You have a fever.  You are sick to your stomach (nauseous).  You are throwing up. Summary  A urinary tract infection (UTI) is an infection of any part of the urinary tract.  This condition is caused by germs in your genital area.  There are many risk factors for a UTI. These include  having a small, thin tube to drain pee and not being able to control when you pee or poop.  Treatment includes antibiotic medicines for germs.  Drink enough fluid to keep your pee pale yellow. This information is not intended to replace advice given to you by your health care provider. Make sure you discuss any questions you have with your health care provider. Document Released: 02/13/2008 Document Revised: 08/14/2018 Document Reviewed: 03/06/2018 Elsevier Patient Education  [place]. Ciprofloxacin tablets What is this medicine? CIPROFLOXACIN (sip roe FLOX a sin) is a quinolone antibiotic. It is used to treat certain kinds of bacterial infections. It will not work for colds, flu, or other viral infections. This medicine may be used for other purposes; ask your health care provider or pharmacist if you have questions. COMMON BRAND NAME(S): Cipro What should I tell my health care provider before I take this medicine? They need to know if you have any of these conditions:  bone problems  diabetes  heart disease  high blood pressure  history of irregular heartbeat  history of low levels of potassium in the blood  joint problems  kidney disease  liver disease  mental illness  myasthenia gravis  seizures  tendon problems  tingling of the fingers or toes, or other nerve disorder  an unusual or allergic reaction to ciprofloxacin, other antibiotics or medicines, foods, dyes, or preservatives  pregnant or trying to get pregnant  breast-feeding How should I use this medicine? Take this medicine by mouth with a full glass of water. Follow the directions on the  prescription label. You can take it with or without food. If it upsets your stomach, take it with food. Take your medicine at regular intervals. Do not take your medicine more often than directed. Take all of your medicine as directed even if you think you are better. Do not skip doses or stop your medicine early. Avoid antacids, aluminum, calcium, iron, magnesium, and zinc products for 6 hours before and 2 hours after taking a dose of this medicine. A special MedGuide will be given to you by the pharmacist with each prescription and refill. Be sure to read this information carefully each time. Talk to your pediatrician regarding the use of this medicine in children. Special care may be needed. Overdosage: If you think you have taken too much of this medicine contact a poison control center or emergency room at once. NOTE: This medicine is only for you. Do not share this medicine with others. What if I miss a dose? If you miss a dose, take it as soon as you can. If it is almost time for your next dose, take only that dose. Do not take double or extra doses. What may interact with this medicine? Do not take this medicine with any of the following medications:  cisapride  dronedarone  flibanserin  lomitapide  pimozide  thioridazine  tizanidine This medicine may also interact with the following medications:  antacids  birth control pills  caffeine  certain medicines for diabetes, like glipizide, glyburide, or insulin  certain medicines that treat or prevent blood clots like warfarin  clozapine  cyclosporine  didanosine buffered tablets or powder  dofetilide  duloxetine  lanthanum carbonate  lidocaine  methotrexate  multivitamins  NSAIDS, medicines for pain and inflammation, like ibuprofen or naproxen  olanzapine  omeprazole  other medicines that prolong the QT interval (cause an abnormal heart  rhythm)  phenytoin  probenecid  ropinirole  sevelamer  sildenafil  sucralfate  theophylline  ziprasidone  zolpidem This list may not describe all possible interactions. Give your health care provider a list of all the medicines, herbs, non-prescription drugs, or dietary supplements you use. Also tell them if you smoke, drink alcohol, or use illegal drugs. Some items may interact with your medicine. What should I watch for while using this medicine? Tell your doctor or health care provider if your symptoms do not start to get better or if they get worse. This medicine may cause serious skin reactions. They can happen weeks to months after starting the medicine. Contact your health care provider right away if you notice fevers or flu-like symptoms with a rash. The rash may be red or purple and then turn into blisters or peeling of the skin. Or, you might notice a red rash with swelling of the face, lips or lymph nodes in your neck or under your arms. Do not treat diarrhea with over the counter products. Contact your doctor if you have diarrhea that lasts more than 2 days or if it is severe and watery. Check with your doctor or health care provider if you get an attack of severe diarrhea, nausea and vomiting, or if you sweat a lot. The loss of too much body fluid can make it dangerous for you to take this medicine. This medicine may increase blood sugar. Ask your health care provider if changes in diet or medicines are needed if you have diabetes. You may get drowsy or dizzy. Do not drive, use machinery, or do anything that needs mental alertness until you know how this medicine affects you. Do not sit or stand up quickly, especially if you are an older patient. This reduces the risk of dizzy or fainting spells. This medicine can make you more sensitive to the sun. Keep out of the sun. If you cannot avoid being in the sun, wear protective clothing and use sunscreen. Do not use sun lamps or  tanning beds/booths. What side effects may I notice from receiving this medicine? Side effects that you should report to your doctor or health care professional as soon as possible:  allergic reactions like skin rash or hives, swelling of the face, lips, or tongue  anxious  bloody or watery diarrhea  confusion  depressed mood  fast, irregular heartbeat  fever  hallucination, loss of contact with reality  joint, muscle, or tendon pain or swelling  loss of memory  pain, tingling, numbness in the hands or feet  redness, blistering, peeling or loosening of the skin, including inside the mouth  seizures  signs and symptoms of aortic dissection such as sudden chest, stomach, or back pain  signs and symptoms of high blood sugar such as being more thirsty or hungry or having to urinate more than normal. You may also feel very tired or have blurry vision.  signs and symptoms of liver injury like dark yellow or brown urine; general ill feeling or flu-like symptoms; light-colored stools; loss of appetite; nausea; right upper belly pain; unusually weak or tired; yellowing of the eyes or skin  signs and symptoms of low blood sugar such as feeling anxious; confusion; dizziness; increased hunger; unusually weak or tired; sweating; shakiness; cold; irritable; headache; blurred vision; fast heartbeat; loss of consciousness; pale skin  suicidal thoughts or other mood changes  sunburn  unusually weak or tired Side effects that usually do not require medical attention (report to your doctor or health care professional if they continue or are bothersome):  dry mouth  headache  nausea  trouble sleeping This list may not describe all possible side effects. Call your doctor for medical advice about side effects. You may report side effects to FDA at 1-800-FDA-1088. Where should I keep my medicine? Keep out of the reach of children. Store at room temperature below 30 degrees C (86  degrees F). Keep container tightly closed. Throw away any unused medicine after the expiration date. NOTE: This sheet is a summary. It may not cover all possible information. If you have questions about this medicine, talk to your doctor, pharmacist, or health care provider.  2020 Elsevier/Gold Standard (2018-11-27 11:26:08)  Rash, Adult  A rash is a change in the color of your skin. A rash can also change the way your skin feels. There are many different conditions and factors that can cause a rash. Follow these instructions at home: The goal of treatment is to stop the itching and keep the rash from spreading. Watch for any changes in your symptoms. Let your doctor know about them. Follow these instructions to help with your condition: Medicine Take or apply over-the-counter and prescription medicines only as told by your doctor. These may include medicines:  To treat red or swollen skin (corticosteroid creams).  To treat itching.  To treat an allergy (oral antihistamines).  To treat very bad symptoms (oral corticosteroids).  Skin care  Put cool cloths (compresses) on the affected areas.  Do not scratch or rub your skin.  Avoid covering the rash. Make sure that the rash is exposed to air as much as possible. Managing itching and discomfort  Avoid hot showers or baths. These can make itching worse. A cold shower may help.  Try taking a bath with: ? Epsom salts. You can get these at your local pharmacy or grocery store. Follow the instructions on the package. ? Baking soda. Pour a small amount into the bath as told by your doctor. ? Colloidal oatmeal. You can get this at your local pharmacy or grocery store. Follow the instructions on the package.  Try putting baking soda paste onto your skin. Stir water into baking soda until it gets like a paste.  Try putting on a lotion that relieves itchiness (calamine lotion).  Keep cool and out of the sun. Sweating and being hot can  make itching worse. General instructions   Rest as needed.  Drink enough fluid to keep your pee (urine) pale yellow.  Wear loose-fitting clothing.  Avoid scented soaps, detergents, and perfumes. Use gentle soaps, detergents, perfumes, and other cosmetic products.  Avoid anything that causes your rash. Keep a journal to help track what causes your rash. Write down: ? What you eat. ? What cosmetic products you use. ? What you drink. ? What you wear. This includes jewelry.  Keep all follow-up visits as told by your doctor. This is important. Contact a doctor if:  You sweat at night.  You lose weight.  You pee (urinate) more than normal.  You pee less than normal, or you notice that your pee is a darker color than normal.  You feel weak.  You throw up (vomit).  Your skin or the whites of your eyes look yellow (jaundice).  Your skin: ? Tingles. ? Is numb.  Your rash: ? Does not go away after a few days. ? Gets worse.  You are: ? More thirsty than normal. ? More tired than normal.  You have: ? New symptoms. ? Pain in your belly (abdomen). ? A fever. ? Watery  poop (diarrhea). Get help right away if:  You have a fever and your symptoms suddenly get worse.  You start to feel mixed up (confused).  You have a very bad headache or a stiff neck.  You have very bad joint pains or stiffness.  You have jerky movements that you cannot control (seizure).  Your rash covers all or most of your body. The rash may or may not be painful.  You have blisters that: ? Are on top of the rash. ? Grow larger. ? Grow together. ? Are painful. ? Are inside your nose or mouth.  You have a rash that: ? Looks like purple pinprick-sized spots all over your body. ? Has a "bull's eye" or looks like a target. ? Is red and painful, causes your skin to peel, and is not from being in the sun too long. Summary  A rash is a change in the color of your skin. A rash can also change  the way your skin feels.  The goal of treatment is to stop the itching and keep the rash from spreading.  Take or apply over-the-counter and prescription medicines only as told by your doctor.  Contact a doctor if you have new symptoms or symptoms that get worse.  Keep all follow-up visits as told by your doctor. This is important. This information is not intended to replace advice given to you by your health care provider. Make sure you discuss any questions you have with your health care provider. Document Released: 02/13/2008 Document Revised: 12/19/2018 Document Reviewed: 03/31/2018 Elsevier Patient Education  2020 [employer].

## 2019-08-28 ENCOUNTER — Encounter: Payer: Self-pay | Admitting: Family Medicine

## 2019-08-28 ENCOUNTER — Other Ambulatory Visit: Payer: Self-pay

## 2019-08-28 DIAGNOSIS — M0579 Rheumatoid arthritis with rheumatoid factor of multiple sites without organ or systems involvement: Secondary | ICD-10-CM

## 2019-08-28 MED ORDER — HYDROCODONE-ACETAMINOPHEN 7.5-325 MG PO TABS: 1.0000 | ORAL_TABLET | Freq: Four times a day (QID) | ORAL | 0 refills | Status: DC | PRN

## 2019-08-28 NOTE — Telephone Encounter (Signed)
Patient of Jeanne [name]

## 2019-08-31 ENCOUNTER — Encounter: Payer: Self-pay | Admitting: Family Medicine

## 2019-09-10 IMAGING — US US RENAL
1 series · 13 of 25 positions shown · non-contrast
Comparison: CT abdomen and pelvis 07/28/2018 and urinary tract
ultrasound 06/06/2016.

CLINICAL DATA: 67-year-old with known LEFT hydronephrosis and
BILATERAL nephrolithiasis. LEFT ureteroscopy and stone extraction
08/04/2018.

EXAM:
RENAL / URINARY TRACT ULTRASOUND COMPLETE

[Series 1: [facility] renal · 0.22mm/px · 13 of 69 slices shown]
[im 1/69]
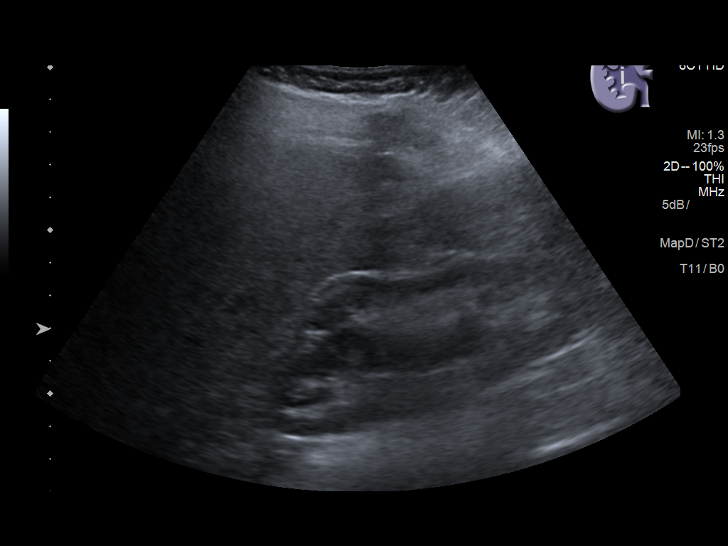
[im 6/69]
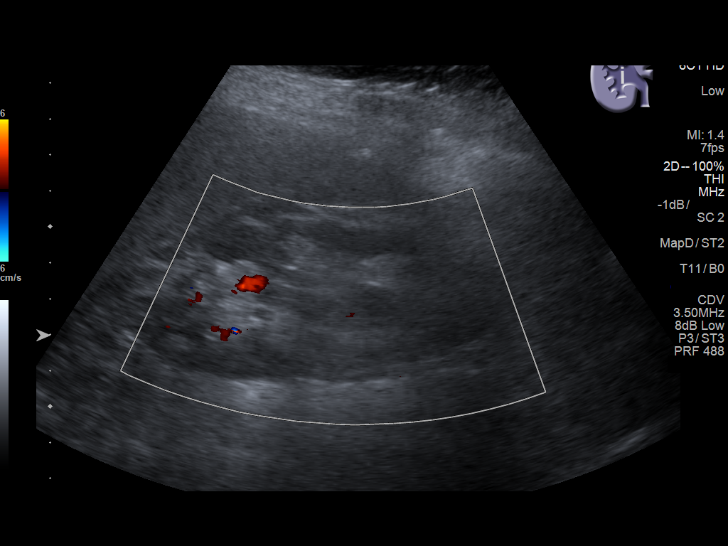
[im 12/69]
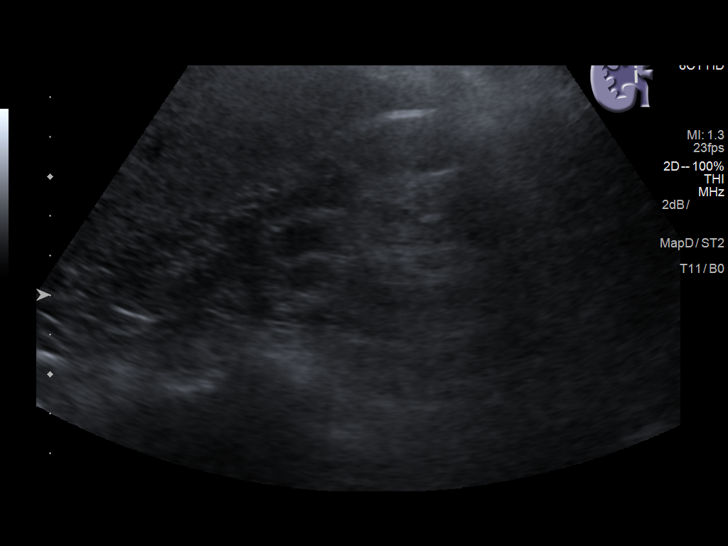
[im 18/69]
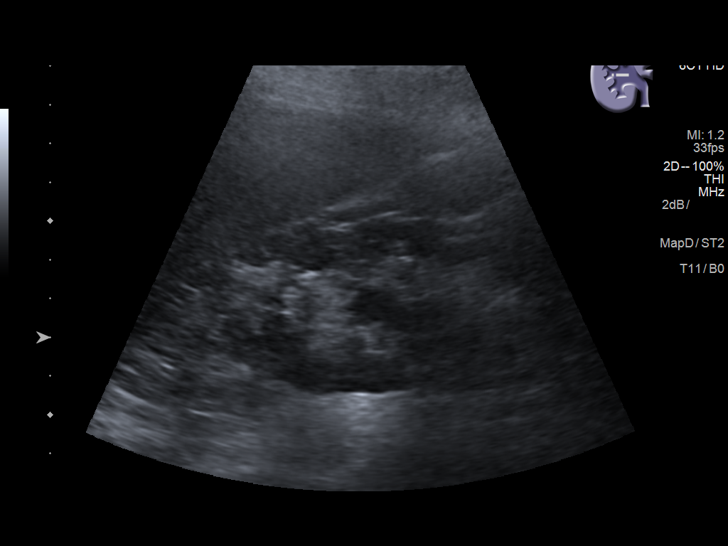
[im 23/69]
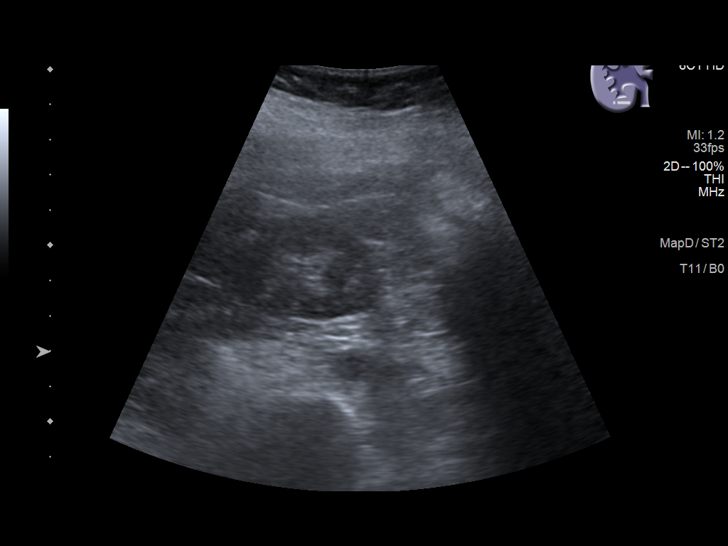
[im 29/69]
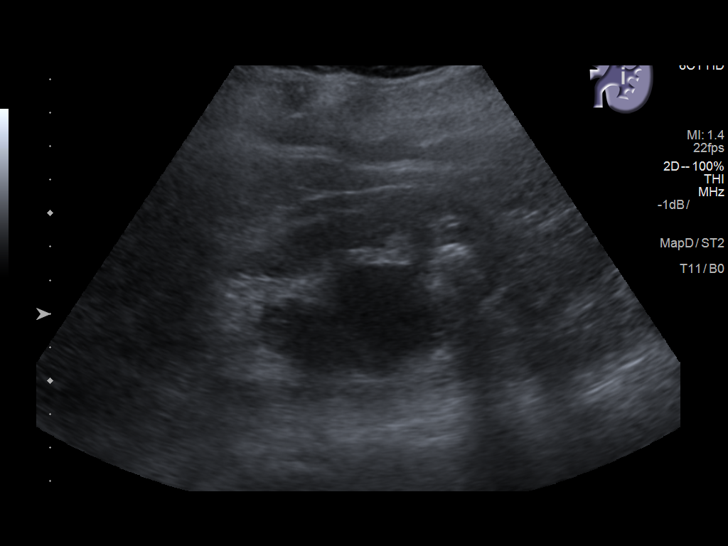
[im 35/69]
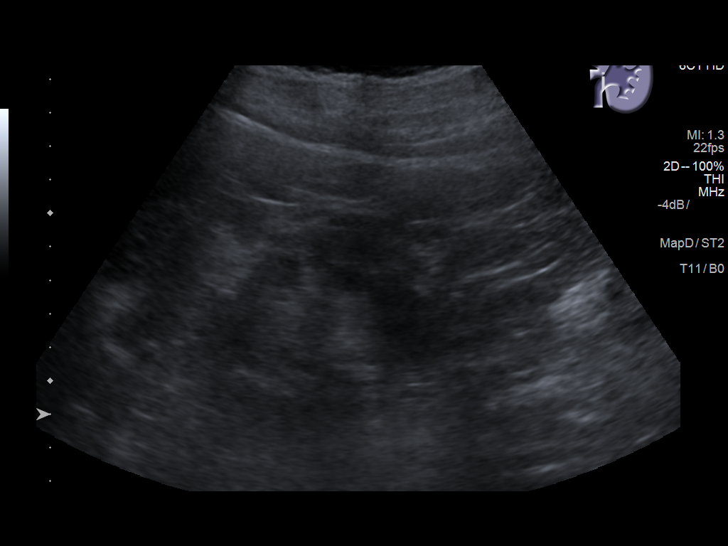
[im 40/69]
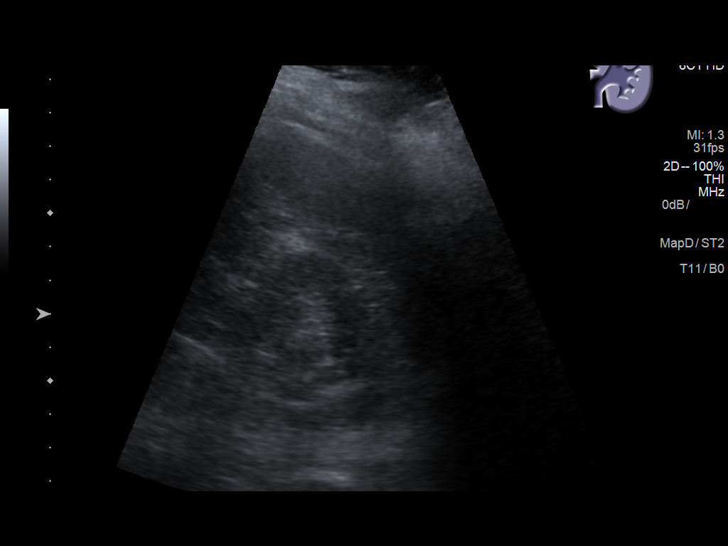
[im 46/69]
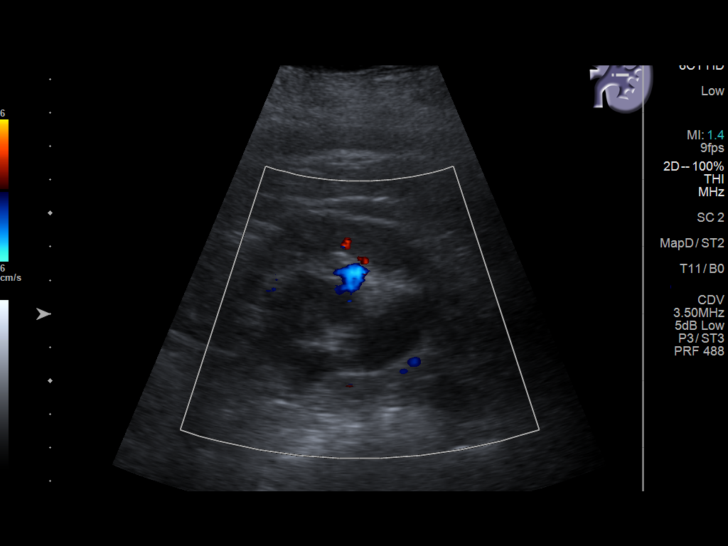
[im 52/69]
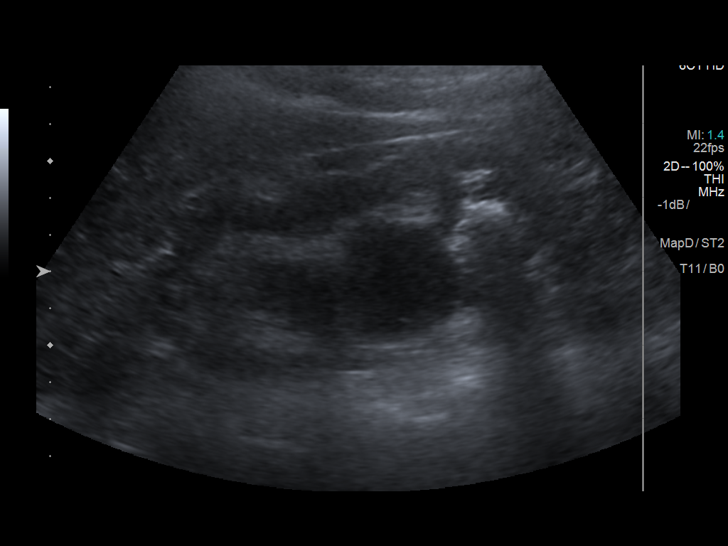
[im 57/69]
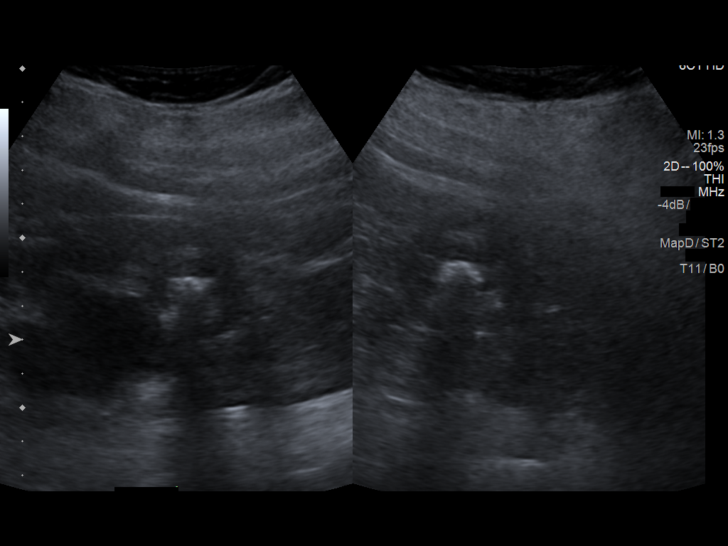
[im 63/69]
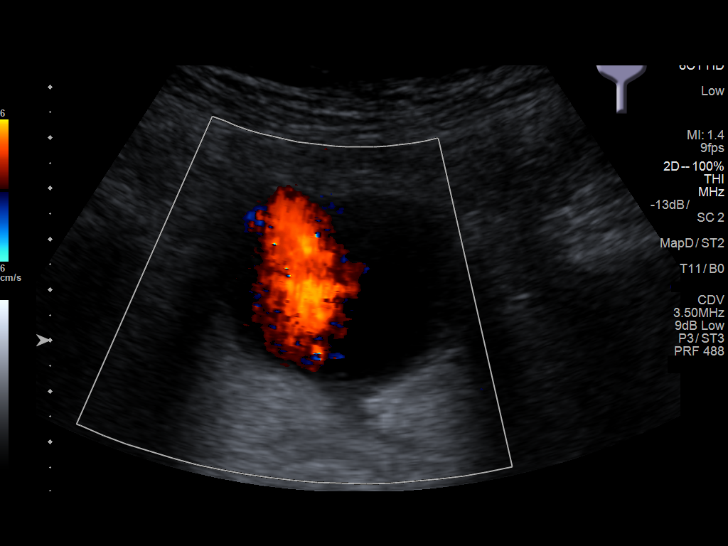
[im 69/69]
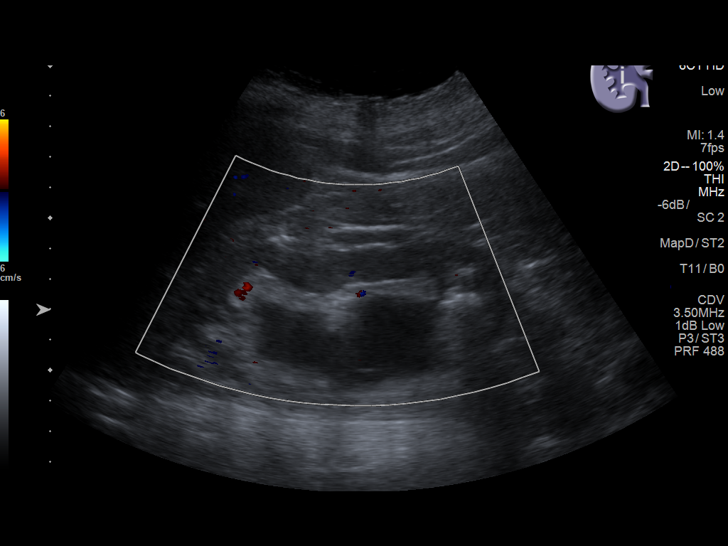

[13 of 25 positions shown; findings below may reference images not displayed]

FINDINGS: Right Kidney:

Renal measurements: Approximately 10.1 x 5.0 x 5.2 cm = volume: 137
mL. Likely parapelvic cysts involving the RIGHT kidney (versus mild
hydronephrosis). Mild diffuse cortical thinning with focal scarring
in the UPPER pole as noted on CT. No parenchymal masses. The
multiple small calculi seen on CT are not visible at ultrasound.

Left Kidney:

Renal measurements: Approximately 11.2 x 5.4 x 5.0 cm = volume: 167
mL. Moderate hydronephrosis, though less than on the prior CT.
Shadowing LEFT LOWER pole calculus/calculi as noted on the CT,
measuring approximately 1.5 cm. Mild diffuse cortical thinning. No
parenchymal masses.

The appearance of both kidneys is unchanged after voiding.

Bladder:

Normal for degree of distention. RIGHT ureteral jets visualized at
color Doppler evaluation. LEFT ureteral jet not visualized.
IMPRESSION: 1. Likely parapelvic cysts involving the RIGHT kidney (versus mild
hydronephrosis), unchanged since the CT 07/28/2018.
2. The multiple RIGHT renal calculi identified on the prior CT are
not visible at ultrasound.
3. Moderate LEFT hydronephrosis, improved since the prior CT.
4. Shadowing LEFT LOWER pole renal calculus/adjacent calculi as
noted on the prior CT.
5. LEFT ureteral jet not visualized at color Doppler evaluation.

## 2019-09-15 ENCOUNTER — Encounter: Payer: Self-pay | Admitting: Family Medicine

## 2019-09-17 ENCOUNTER — Other Ambulatory Visit: Payer: Self-pay

## 2019-09-17 DIAGNOSIS — N390 Urinary tract infection, site not specified: Secondary | ICD-10-CM | POA: Diagnosis not present

## 2019-09-19 LAB — URINE CULTURE

## 2019-09-21 NOTE — Telephone Encounter (Signed)
UCx shows UTI caused by E coli.  It is resistant to Cipro, which seems to be the last thing that she took for a UTI.  If she's agreeable, I'd recommend treating with Keflex 500mg  BID x7d. Ok to eRx if patient agrees.

## 2019-09-22 ENCOUNTER — Other Ambulatory Visit: Payer: Self-pay

## 2019-09-22 ENCOUNTER — Telehealth: Payer: Self-pay

## 2019-09-22 ENCOUNTER — Other Ambulatory Visit: Payer: Self-pay | Admitting: Family Medicine

## 2019-09-22 DIAGNOSIS — N390 Urinary tract infection, site not specified: Secondary | ICD-10-CM

## 2019-09-22 NOTE — Telephone Encounter (Signed)
Patient has history or recurrent UTI secondary to radiation cystitis.  Patient has current infection and needs Rx sent to pharmacy.  She would like referral to specialist to see if there is anything else that she should be doing.  She has seen Caryl Pina brandon but prefers to see another provider.  Please advise on antibiotic and referral.  Thanks

## 2019-09-22 NOTE — Telephone Encounter (Signed)
Refill request for NITROFURANTOIN MONO-MCR 100 MG. Med not assigned to refill protocol.

## 2019-09-22 NOTE — Telephone Encounter (Signed)
Can patient bring urine sample to office and do either in person or virtual visit. Will refer at that time and would prefer to have urine culture given her most recent video visit with Shingles rash and recurrent radiation cystitis.

## 2019-09-23 ENCOUNTER — Other Ambulatory Visit: Payer: Self-pay

## 2019-09-23 ENCOUNTER — Telehealth (INDEPENDENT_AMBULATORY_CARE_PROVIDER_SITE_OTHER): Payer: Medicare HMO | Admitting: Adult Health

## 2019-09-23 DIAGNOSIS — M0579 Rheumatoid arthritis with rheumatoid factor of multiple sites without organ or systems involvement: Secondary | ICD-10-CM

## 2019-09-23 DIAGNOSIS — Z87442 Personal history of urinary calculi: Secondary | ICD-10-CM | POA: Diagnosis not present

## 2019-09-23 DIAGNOSIS — Z8541 Personal history of malignant neoplasm of cervix uteri: Secondary | ICD-10-CM

## 2019-09-23 DIAGNOSIS — B369 Superficial mycosis, unspecified: Secondary | ICD-10-CM | POA: Insufficient documentation

## 2019-09-23 DIAGNOSIS — N390 Urinary tract infection, site not specified: Secondary | ICD-10-CM

## 2019-09-23 DIAGNOSIS — R300 Dysuria: Secondary | ICD-10-CM | POA: Diagnosis not present

## 2019-09-23 DIAGNOSIS — N3040 Irradiation cystitis without hematuria: Secondary | ICD-10-CM | POA: Diagnosis not present

## 2019-09-23 MED ORDER — NYSTATIN 100000 UNIT/GM EX CREA: 1.0000 "application " | TOPICAL_CREAM | Freq: Two times a day (BID) | CUTANEOUS | 0 refills | Status: DC

## 2019-09-23 MED ORDER — HYDROCODONE-ACETAMINOPHEN 7.5-325 MG PO TABS: 1.0000 | ORAL_TABLET | Freq: Four times a day (QID) | ORAL | 0 refills | Status: DC | PRN

## 2019-09-23 MED ORDER — CEPHALEXIN 500 MG PO CAPS: 500.0000 mg | ORAL_CAPSULE | Freq: Two times a day (BID) | ORAL | 0 refills | Status: DC

## 2019-09-23 NOTE — Patient Instructions (Signed)
Advised patient call the office or your primary care doctor for an appointment if no improvement within 72 hours or if any symptoms change or worsen at any time  Advised ER or urgent Care if after hours or on weekend. Call 911 for emergency symptoms at any time.Patinet verbalized understanding of all instructions given/reviewed and treatment plan and has no further questions or concerns at this time.    In person visit recommended if follow up needed. Also needs labs that have been previously ordered by Chrismon, Vickki Muff, PA   Intertrigo Intertrigo is skin irritation (inflammation) that happens in warm, moist areas of the body. The irritation can cause a rash and make skin raw and itchy. The rash is usually pink or red. It happens mostly between folds of skin or where skin rubs together, such as:  Between the toes.  In the armpits.  In the groin area.  Under the belly.  Under the breasts.  Around the butt area. This condition is not passed from person to person (is not contagious). What are the causes?  Heat, moisture, rubbing, and not enough air movement.  The condition can be made worse by: ? Sweat. ? Bacteria. ? A fungus, such as yeast. What increases the risk?  Moisture in your skin folds.  You are more likely to develop this condition if you: ? Have diabetes. ? Are overweight. ? Are not able to move around. ? Live in a warm and moist climate. ? Wear splints, braces, or other medical devices. ? Are not able to control your pee (urine) or poop (stool). What are the signs or symptoms?  A pink or red skin rash in the skin fold or near the skin fold.  Raw or scaly skin.  Itching.  A burning feeling.  Bleeding.  Leaking fluid.  A bad smell. How is this treated?  Cleaning and drying your skin.  Taking an antibiotic medicine or using an antibiotic skin cream for a bacterial infection.  Using an antifungal cream on your skin or taking pills for an infection  that was caused by a fungus, such as yeast.  Using a steroid ointment to stop the itching and irritation.  Separating the skin fold with a clean cotton cloth to absorb moisture and allow air to flow into the area. Follow these instructions at home:  Keep the affected area clean and dry.  Do not scratch your skin.  Stay cool as much as you can. Use an air conditioner or a fan, if you have one.  Apply over-the-counter and prescription medicines only as told by your doctor.  If you were prescribed an antibiotic medicine, use it as told by your doctor. Do not stop using the antibiotic even if your condition starts to get better.  Keep all follow-up visits as told by your doctor. This is important. How is this prevented?   Stay at a healthy weight.  Take care of your feet. This is very important if you have diabetes. You should: ? Wear shoes that fit well. ? Keep your feet dry. ? Wear clean cotton or wool socks.  Protect the skin in your groin and butt area as told by your doctor. To do this: ? Follow a regular cleaning routine. ? Use creams, powders, or ointments that protect your skin. ? Change protection pads often.  Do not wear tight clothes. Wear clothes that: ? Are loose. ? Take moisture away from your body. ? Are made of cotton.  Wear a bra  that gives good support, if needed.  Shower and dry yourself well after being active. Use a hair dryer on a cool setting to dry between skin folds.  Keep your blood sugar under control if you have diabetes. Contact a doctor if:  Your symptoms do not get better with treatment.  Your symptoms get worse or they spread.  You notice more redness and warmth.  You have a fever. Summary  Intertrigo is skin irritation that occurs when folds of skin rub together.  This condition is caused by heat, moisture, and rubbing.  This condition may be treated by cleaning and drying your skin and with medicines.  Apply over-the-counter  and prescription medicines only as told by your doctor.  Keep all follow-up visits as told by your doctor. This is important. This information is not intended to replace advice given to you by your health care provider. Make sure you discuss any questions you have with your health care provider. Document Revised: 06/05/2018 Document Reviewed: 06/05/2018 Elsevier Patient Education  [place]. Nystatin skin cream or ointment What is this medicine? NYSTATIN (nye STAT in) is an antifungal medicine. It is used to treat certain kinds of fungal or yeast infections of the skin. This medicine may be used for other purposes; ask your health care provider or pharmacist if you have questions. COMMON BRAND NAME(S): Mycostatin, Nystex, Pediaderm AF What should I tell my health care provider before I take this medicine? They need to know if you have any of these conditions:  an unusual or allergic reaction to nystatin, other foods, dyes or preservatives  pregnant or trying to get pregnant  breast-feeding How should I use this medicine? This medicine is for external use on the skin only. Follow the directions on the prescription label. Wash hands before and after use. If treating a hand or nail infection, wash hands before use only. Apply a thin layer of this medicine to cover the affected skin and surrounding area. You can cover the area with a sterile gauze dressing (bandage). Do not use an airtight bandage (such as a plastic-covered bandage). Do not get the medicine in your eyes. If you do, rinse out with plenty of cool tap water. Use the full course of treatment prescribed, even if you think the infection is getting better. Use at regular intervals. Do not use your medicine more often than directed. Do not use this medicine for any condition other than the one for which it was prescribed. Talk to your pediatrician regarding the use of this medicine in children. Special care may be  needed. Overdosage: If you think you have taken too much of this medicine contact a poison control center or emergency room at once. NOTE: This medicine is only for you. Do not share this medicine with others. What if I miss a dose? If you miss a dose, use it as soon as you can. If it is almost time for your next dose, use only that dose. Do not use double or extra doses. What may interact with this medicine? Interactions are not expected. Do not use any other skin products on the affected area without telling your doctor or health care professional. This list may not describe all possible interactions. Give your health care provider a list of all the medicines, herbs, non-prescription drugs, or dietary supplements you use. Also tell them if you smoke, drink alcohol, or use illegal drugs. Some items may interact with your medicine. What should I watch for while using  this medicine? Tell your doctor or health care professional if your symptoms do not improve after 3 days. After bathing make sure that your skin is very dry. Fungal infections like moist conditions. Do not walk around barefoot. To help prevent reinfection, wear freshly washed cotton, not synthetic, clothing. What side effects may I notice from receiving this medicine? Side effects that usually do not require medical attention (report to your doctor or health care professional if they continue or are bothersome):  skin irritation This list may not describe all possible side effects. Call your doctor for medical advice about side effects. You may report side effects to FDA at 1-800-FDA-1088. Where should I keep my medicine? Keep out of the reach of children. Store at room temperature 15 to 30 degrees C (59 to 86 degrees F). Throw away any unused medicine after the expiration date. NOTE: This sheet is a summary. It may not cover all possible information. If you have questions about this medicine, talk to your doctor, pharmacist, or  health care provider.  2020 Elsevier/Gold Standard (2015-09-28 16:28:30) Cephalexin Tablets or Capsules What is this medicine? CEPHALEXIN (sef a LEX in) is a cephalosporin antibiotic. It treats some infections caused by bacteria. It will not work for colds, the flu, or other viruses. This medicine may be used for other purposes; ask your health care provider or pharmacist if you have questions. COMMON BRAND NAME(S): Biocef, Daxbia, Keflex, Keftab What should I tell my health care provider before I take this medicine? They need to know if you have any of these conditions:  kidney disease  stomach or intestine problems, especially colitis  an unusual or allergic reaction to cephalexin, other cephalosporins, penicillins, other antibiotics, medicines, foods, dyes or preservatives  pregnant or trying to get pregnant  breast-feeding How should I use this medicine? Take this drug by mouth. Take it as directed on the prescription label at the same time every day. You can take it with or without food. If it upsets your stomach, take it with food. Take all of this drug unless your health care provider tells you to stop it early. Keep taking it even if you think you are better. Talk to your health care provider about the use of this drug in children. While it may be prescribed for selected conditions, precautions do apply. Overdosage: If you think you have taken too much of this medicine contact a poison control center or emergency room at once. NOTE: This medicine is only for you. Do not share this medicine with others. What if I miss a dose? If you miss a dose, take it as soon as you can. If it is almost time for your next dose, take only that dose. Do not take double or extra doses. What may interact with this medicine?  probenecid  some other antibiotics This list may not describe all possible interactions. Give your health care provider a list of all the medicines, herbs, non-prescription  drugs, or dietary supplements you use. Also tell them if you smoke, drink alcohol, or use illegal drugs. Some items may interact with your medicine. What should I watch for while using this medicine? Tell your doctor or health care provider if your symptoms do not begin to improve in a few days. This medicine may cause serious skin reactions. They can happen weeks to months after starting the medicine. Contact your health care provider right away if you notice fevers or flu-like symptoms with a rash. The rash may be red or  purple and then turn into blisters or peeling of the skin. Or, you might notice a red rash with swelling of the face, lips or lymph nodes in your neck or under your arms. Do not treat diarrhea with over the counter products. Contact your doctor if you have diarrhea that lasts more than 2 days or if it is severe and watery. If you have diabetes, you may get a false-positive result for sugar in your urine. Check with your doctor or health care provider. What side effects may I notice from receiving this medicine? Side effects that you should report to your doctor or health care professional as soon as possible:  allergic reactions like skin rash, itching or hives, swelling of the face, lips, or tongue  breathing problems  pain or trouble passing urine  redness, blistering, peeling or loosening of the skin, including inside the mouth  severe or watery diarrhea  unusually weak or tired  yellowing of the eyes, skin Side effects that usually do not require medical attention (report to your doctor or health care professional if they continue or are bothersome):  gas or heartburn  genital or anal irritation  headache  joint or muscle pain  nausea, vomiting This list may not describe all possible side effects. Call your doctor for medical advice about side effects. You may report side effects to FDA at 1-800-FDA-1088. Where should I keep my medicine? Keep out of the reach  of children and pets. Store at room temperature between 20 and 25 degrees C (68 and 77 degrees F). Throw away any unused drug after the expiration date. NOTE: This sheet is a summary. It may not cover all possible information. If you have questions about this medicine, talk to your doctor, pharmacist, or health care provider.  2020 Elsevier/Gold Standard (2019-04-03 11:27:00) Urinary Tract Infection, Adult A urinary tract infection (UTI) is an infection of any part of the urinary tract. The urinary tract includes:  The kidneys.  The ureters.  The bladder.  The urethra. These organs make, store, and get rid of pee (urine) in the body. What are the causes? This is caused by germs (bacteria) in your genital area. These germs grow and cause swelling (inflammation) of your urinary tract. What increases the risk? You are more likely to develop this condition if:  You have a small, thin tube (catheter) to drain pee.  You cannot control when you pee or poop (incontinence).  You are female, and: ? You use these methods to prevent pregnancy:  A medicine that kills sperm (spermicide).  A device that blocks sperm (diaphragm). ? You have low levels of a female hormone (estrogen). ? You are pregnant.  You have genes that add to your risk.  You are sexually active.  You take antibiotic medicines.  You have trouble peeing because of: ? A prostate that is bigger than normal, if you are female. ? A blockage in the part of your body that drains pee from the bladder (urethra). ? A kidney stone. ? A nerve condition that affects your bladder (neurogenic bladder). ? Not getting enough to drink. ? Not peeing often enough.  You have other conditions, such as: ? Diabetes. ? A weak disease-fighting system (immune system). ? Sickle cell disease. ? Gout. ? Injury of the spine. What are the signs or symptoms? Symptoms of this condition include:  Needing to pee right away  (urgently).  Peeing often.  Peeing small amounts often.  Pain or burning when peeing.  Blood  in the pee.  Pee that smells bad or not like normal.  Trouble peeing.  Pee that is cloudy.  Fluid coming from the vagina, if you are female.  Pain in the belly or lower back. Other symptoms include:  Throwing up (vomiting).  No urge to eat.  Feeling mixed up (confused).  Being tired and grouchy (irritable).  A fever.  Watery poop (diarrhea). How is this treated? This condition may be treated with:  Antibiotic medicine.  Other medicines.  Drinking enough water. Follow these instructions at home:  Medicines  Take over-the-counter and prescription medicines only as told by your doctor.  If you were prescribed an antibiotic medicine, take it as told by your doctor. Do not stop taking it even if you start to feel better. General instructions  Make sure you: ? Pee until your bladder is empty. ? Do not hold pee for a long time. ? Empty your bladder after sex. ? Wipe from front to back after pooping if you are a female. Use each tissue one time when you wipe.  Drink enough fluid to keep your pee pale yellow.  Keep all follow-up visits as told by your doctor. This is important. Contact a doctor if:  You do not get better after 1-2 days.  Your symptoms go away and then come back. Get help right away if:  You have very bad back pain.  You have very bad pain in your lower belly.  You have a fever.  You are sick to your stomach (nauseous).  You are throwing up. Summary  A urinary tract infection (UTI) is an infection of any part of the urinary tract.  This condition is caused by germs in your genital area.  There are many risk factors for a UTI. These include having a small, thin tube to drain pee and not being able to control when you pee or poop.  Treatment includes antibiotic medicines for germs.  Drink enough fluid to keep your pee pale yellow. This  information is not intended to replace advice given to you by your health care provider. Make sure you discuss any questions you have with your health care provider. Document Revised: 08/14/2018 Document Reviewed: 03/06/2018 Elsevier Patient Education  2020 [employer].

## 2019-09-23 NOTE — Telephone Encounter (Signed)
Telephone visit was completed by provider.

## 2019-09-23 NOTE — Addendum Note (Signed)
Addended by: Doreen Beam on: 09/23/2019 12:15 PM   Modules accepted: Level of Service

## 2019-09-23 NOTE — Telephone Encounter (Signed)
Spoke with patient she declined bringing in sample and declined visit. Patient states that she dropped off sample on 09/17/19 and it was sent for culture. Patient is requesting a prescription for Keflex sent to pharmacy. Please advise. KW

## 2019-09-23 NOTE — Progress Notes (Signed)
Duplicate encounter see telephone note for actual encounter/ telephone visit with provider.

## 2019-09-23 NOTE — Telephone Encounter (Addendum)
Virtual Visit via Telephone Note  I connected with Jeanne [name] on '@TODAY' @ at  by telephone and verified that I am speaking with the correct person using two identifiers.  Location: Patient: at home  Provider : Provider: Provider's office at  [facility], [place].   I discussed the limitations, risks, security and privacy concerns of performing an evaluation and management service by telephone and the availability of in person appointments. I also discussed with the patient that there may be a patient responsible charge related to this service. The patient expressed understanding and agreed to proceed.    I discussed the assessment and treatment plan with the patient. The patient was provided an opportunity to ask questions and all were answered. The patient agreed with the plan and demonstrated an understanding of the instructions.   The patient was advised to call back or seek an in-person evaluation if the symptoms worsen or if the condition fails to improve as anticipated.  I provided 15 minutes of non-face-to-face time during this encounter. 10 minutes spent with chart review and of media sent by patient.    Marcille Buffy, FNP    Provider called patient on 09/23/2019 at 11:18 am. Discussed the following. Patient declined in office visit. [place] with telephone visit.    09/17/2019 urine culture sent to provider on 09/22/2019 at closing. Patient still has bacteria as below. She has mild dysuria. Denies any systemic symptoms. She will be treated with Keflex, nausea only with Augmentin in past. She denies any other symptoms. Denies back/ flank or abdominal pain.   She has to follow up with repeat urine culture 1 week after completing antibiotic and it is recommended she follow up with urology in the near future.   Review of Systems - positive for dysuria, rash bilateral axillary, itching.  Negative for fever, fatigue, chills, nausea, vomiting, diarrhea     Urine Culture Order: [id number] Status:  Final result Visible to patient:  Yes (MyChart) Next appt:  None Dx:  Recurrent UTI Specimen Information: Urine   UR     Component 6 d ago  Urine Culture, Routine Final reportAbnormal    Organism ID, Bacteria CommentAbnormal    Comment: Escherichia coli, identified by an automated biochemical system.  Greater than 100,000 colony forming units per mL  Cefazolin with an MIC <=16 predicts susceptibility to the oral agents  cefaclor, cefdinir, cefpodoxime, cefprozil, cefuroxime, cephalexin,  and loracarbef when used for therapy of uncomplicated urinary tract  infections due to E. coli, Klebsiella pneumoniae, and Proteus  mirabilis.   Antimicrobial Susceptibility Comment   Comment:   ** S = Susceptible; I = Intermediate; R = Resistant **           P = Positive; N = Negative        MICS are expressed in micrograms per mL   Antibiotic         RSLT#1  RSLT#2  RSLT#3  RSLT#4  Amoxicillin/Clavulanic Acid  R  Ampicillin           R  Cefazolin           S  Cefepime            S  Ceftriaxone          S  Cefuroxime           I  Ciprofloxacin         R  Ertapenem  S  Gentamicin           S  Imipenem            S  Levofloxacin          R  Meropenem           S  Nitrofurantoin         S  Piperacillin/Tazobactam    I  Tetracycline          S  Tobramycin           S  Trimethoprim/Sulfa       S   Resulting Agency [facility]    Narrative Performed by: Maryan Puls Performed at: [address]   [address], [place], [place] [id number]  Lab Director: Rush Shober MD, Phone: [phone]        Patient is alert and oriented and responsive to questions Engages in conversation with provider. Speaks in full sentences without any pauses without any  shortness of breath or distress.      Patient request. She declined to go back and see current urologist Hollice Espy. She wants 2nd opinion and referral placed.  Orders Placed This Encounter  Procedures  . Ambulatory referral to Urology    Referral Priority:   Routine    Referral Type:   Consultation    Referral Reason:   Second Opinion    Referred to Provider:   Irine Seal, MD    Requested Specialty:   Urology    Number of Visits Requested:   1   See likely fungal rash in media from 09/16/2019. Itching. Denies any drainage or other symptoms. Present x 7 days. Patient  denies any fever, body aches,chills, rash, chest pain, shortness of breath, nausea, vomiting, or diarrhea.  Treated for herpes zoster on 12/ 14/2021 and she reports this rash has completely resolved with treatment it was located on her left hand. Denies any pain.    Chronic radiation cystitis - Plan: Ambulatory referral to Urology  Hx of renal calculi - Plan: Ambulatory referral to Urology  History of cervical cancer - Plan: Ambulatory referral to Urology  Dysuria - Plan: Ambulatory referral to Urology  Fungal infection of skin  Urinary tract infection without hematuria, site unspecified   Meds ordered this encounter  Medications  . cephALEXin (KEFLEX) 500 MG capsule    Sig: Take 1 capsule (500 mg total) by mouth 2 (two) times daily.    Dispense:  14 capsule    Refill:  0    Nausea with Augmentin only. No other allergy with it.  . nystatin cream (MYCOSTATIN)    Sig: Apply 1 application topically 2 (two) times daily. To area as discussed.    Dispense:  30 g    Refill:  0   If rash or dysuria persist she must schedule virtual visit/ and or in person visit. She is aware she is overdue for labs and she should have labs ordered by Chrismon, Vickki Muff, PA as soon as possible - lab is walk in Monday to Friday 8 am- 330 pm. She had abnormal CBC and alk phosphatase  on last labs.   Patient verbalized  understanding of all instructions given and denies any further questions at this time.  Advised patient call the office or your primary care doctor for an appointment if no improvement within 72 hours or if any symptoms change or worsen at any time  Advised ER or urgent Care if after hours or on weekend. Call  911 for emergency symptoms at any time.Patinet verbalized understanding of all instructions given/reviewed and treatment plan and has no further questions or concerns at this time.

## 2019-09-28 ENCOUNTER — Telehealth: Payer: Self-pay | Admitting: Adult Health

## 2019-10-01 ENCOUNTER — Encounter: Payer: Self-pay | Admitting: Family Medicine

## 2019-10-02 ENCOUNTER — Other Ambulatory Visit: Payer: Self-pay | Admitting: Family Medicine

## 2019-10-02 DIAGNOSIS — N3090 Cystitis, unspecified without hematuria: Secondary | ICD-10-CM

## 2019-10-02 DIAGNOSIS — Z87442 Personal history of urinary calculi: Secondary | ICD-10-CM | POA: Diagnosis not present

## 2019-10-02 DIAGNOSIS — I10 Essential (primary) hypertension: Secondary | ICD-10-CM | POA: Diagnosis not present

## 2019-10-02 DIAGNOSIS — N390 Urinary tract infection, site not specified: Secondary | ICD-10-CM | POA: Diagnosis not present

## 2019-10-02 DIAGNOSIS — R319 Hematuria, unspecified: Secondary | ICD-10-CM | POA: Diagnosis not present

## 2019-10-02 DIAGNOSIS — E782 Mixed hyperlipidemia: Secondary | ICD-10-CM | POA: Diagnosis not present

## 2019-10-02 DIAGNOSIS — M0579 Rheumatoid arthritis with rheumatoid factor of multiple sites without organ or systems involvement: Secondary | ICD-10-CM | POA: Diagnosis not present

## 2019-10-03 LAB — COMPREHENSIVE METABOLIC PANEL
ALT: 21 IU/L (ref 0–32)
AST: 25 IU/L (ref 0–40)
Albumin/Globulin Ratio: 1.6 (ref 1.2–2.2)
Albumin: 3.9 g/dL (ref 3.8–4.8)
Alkaline Phosphatase: 201 IU/L — ABNORMAL HIGH (ref 39–117)
BUN/Creatinine Ratio: 25 (ref 12–28)
BUN: 21 mg/dL (ref 8–27)
Bilirubin Total: 0.4 mg/dL (ref 0.0–1.2)
CO2: 20 mmol/L (ref 20–29)
Calcium: 8.7 mg/dL (ref 8.7–10.3)
Chloride: 112 mmol/L — ABNORMAL HIGH (ref 96–106)
Creatinine, Ser: 0.83 mg/dL (ref 0.57–1.00)
GFR calc Af Amer: 85 mL/min/{1.73_m2} (ref >59)
GFR calc non Af Amer: 74 mL/min/{1.73_m2} (ref >59)
Globulin, Total: 2.4 g/dL (ref 1.5–4.5)
Glucose: 100 mg/dL — ABNORMAL HIGH (ref 65–99)
Potassium: 4.8 mmol/L (ref 3.5–5.2)
Sodium: 146 mmol/L — ABNORMAL HIGH (ref 134–144)
Total Protein: 6.3 g/dL (ref 6.0–8.5)

## 2019-10-03 LAB — CBC WITH DIFFERENTIAL/PLATELET
Basophils Absolute: 0.1 10*3/uL (ref 0.0–0.2)
Basos: 1 %
EOS (ABSOLUTE): 0.3 10*3/uL (ref 0.0–0.4)
Eos: 5 %
Hematocrit: 41.7 % (ref 34.0–46.6)
Hemoglobin: 13.9 g/dL (ref 11.1–15.9)
Immature Grans (Abs): 0 10*3/uL (ref 0.0–0.1)
Immature Granulocytes: 0 %
Lymphocytes Absolute: 1.5 10*3/uL (ref 0.7–3.1)
Lymphs: 22 %
MCH: 29.3 pg (ref 26.6–33.0)
MCHC: 33.3 g/dL (ref 31.5–35.7)
MCV: 88 fL (ref 79–97)
Monocytes Absolute: 0.4 10*3/uL (ref 0.1–0.9)
Monocytes: 6 %
Neutrophils Absolute: 4.3 10*3/uL (ref 1.4–7.0)
Neutrophils: 66 %
Platelets: 250 10*3/uL (ref 150–450)
RBC: 4.75 x10E6/uL (ref 3.77–5.28)
RDW: 13.4 % (ref 11.7–15.4)
WBC: 6.6 10*3/uL (ref 3.4–10.8)

## 2019-10-03 LAB — TSH: TSH: 1.51 u[IU]/mL (ref 0.450–4.500)

## 2019-10-03 LAB — LIPID PANEL
Chol/HDL Ratio: 2.9 ratio (ref 0.0–4.4)
Cholesterol, Total: 176 mg/dL (ref 100–199)
HDL: 60 mg/dL (ref >39)
LDL Chol Calc (NIH): 90 mg/dL (ref 0–99)
Triglycerides: 154 mg/dL — ABNORMAL HIGH (ref 0–149)
VLDL Cholesterol Cal: 26 mg/dL (ref 5–40)

## 2019-10-04 LAB — URINE CULTURE: Organism ID, Bacteria: NO GROWTH

## 2019-10-05 ENCOUNTER — Encounter: Payer: Self-pay | Admitting: Family Medicine

## 2019-10-08 ENCOUNTER — Telehealth (INDEPENDENT_AMBULATORY_CARE_PROVIDER_SITE_OTHER): Payer: Medicare HMO | Admitting: Adult Health

## 2019-10-08 ENCOUNTER — Other Ambulatory Visit: Payer: Self-pay

## 2019-10-08 ENCOUNTER — Encounter: Payer: Self-pay | Admitting: Adult Health

## 2019-10-08 DIAGNOSIS — Z87448 Personal history of other diseases of urinary system: Secondary | ICD-10-CM

## 2019-10-08 DIAGNOSIS — R399 Unspecified symptoms and signs involving the genitourinary system: Secondary | ICD-10-CM | POA: Diagnosis not present

## 2019-10-08 LAB — POCT URINALYSIS DIPSTICK
Glucose, UA: NEGATIVE
Ketones, UA: NEGATIVE
Nitrite, UA: POSITIVE
Protein, UA: POSITIVE — AB
Spec Grav, UA: 1.015 (ref 1.010–1.025)
Urobilinogen, UA: 0.2 E.U./dL
pH, UA: 6 (ref 5.0–8.0)

## 2019-10-08 MED ORDER — CEPHALEXIN 500 MG PO CAPS: 500.0000 mg | ORAL_CAPSULE | Freq: Two times a day (BID) | ORAL | 0 refills | Status: DC

## 2019-10-08 NOTE — Progress Notes (Signed)
Sample was brought to office after virtual visit. Patient was called by provider with the point of care urine results. Positive for protein, moderate bacteria, and large blood.  Keflex e- script to pharmacy per bottle.  Urine for culture. Recommend hydration with clear fluids.  Patient advised  to seek care immediately in person ER if symptoms of pyelonephritis occur. Advised should keep urology appointment already scheduled 10/26/2019 for recurrent urinary issues and history of radiation cystis.

## 2019-10-08 NOTE — Progress Notes (Signed)
Patient: Jeanne [name] Female    DOB: 1951-02-23   69 y.o.   MRN: [id number] Visit Date: 10/08/2019  Today's Provider: Marcille Buffy, FNP   Chief Complaint  Patient presents with  . Urinary Tract Infection   Subjective:    Virtual Visit via Video Note  I connected with [name] on 10/08/19 at 11:00 AM EST by a video enabled telemedicine application and verified that I am speaking with the correct person using two identifiers.  Location: Patient: at home  Provider: Provider: Provider's office at  [facility], [place].      I discussed the limitations of evaluation and management by telemedicine and the availability of in person appointments. The patient expressed understanding and agreed to proceed.     I discussed the assessment and treatment plan with the patient. The patient was provided an opportunity to ask questions and all were answered. The patient agreed with the plan and demonstrated an understanding of the instructions.   The patient was advised to call back or seek an in-person evaluation if the symptoms worsen or if the condition fails to improve as anticipated.     Urinary Tract Infection  This is a recurrent problem. The current episode started in the past 7 days. The problem has been unchanged. There has been no fever. Associated symptoms include chills (reports resolved on 10/05/18. ), flank pain (reports resolved on 10/05/18), frequency, hematuria (she reports she always has small amount of blood in urine since her procedure years ago. denies any blood more than scant. ) and hesitancy. Pertinent negatives include no discharge, nausea, possible pregnancy, sweats, urgency or vomiting. Associated symptoms comments: Urinary odor. She has tried antibiotics and increased fluids for the symptoms. The treatment provided no relief. Her past medical history is significant for kidney stones and recurrent UTIs.   She was treated  with  keflex for e coli that was found on 09/19/2019.  Recheck Urine 10/02/2019 showed no growth. She was treated with Keflex and tolerated without any concerns.  She has mild lower back pain, mostly on left side that was on 10/06/2019 and has resolved now she reports.  Denies any abdominal pain or pelvic pain.Denies any back pain currently. Onset 10/06/2019 she had some mild chills that resolved. Denies any fever currently, she did not check it when she had chills once on 10/06/2019.  She reports she has a odor to her urine and reports it is cloudy urine. She has burning with urination-mild.   She denies any vaginal bleeding, vaginal sores or abnormal vaginal discharge.  Denies any unusual fatigue.  She denies any history of pyelonephritis. She has history of kidney stones she reports and thought maybe she passed one the 52 th.   She has an appointment with urology on 10/26/19 was referred for continued urinary infections, reported chronic radiation cystitis and extensive history.     Allergies  Allergen Reactions  . Codeine Nausea Only and Nausea And Vomiting  . Augmentin [Amoxicillin-Pot Clavulanate] Nausea Only    Very nauseated/ feels sore on body   . Sulfa Antibiotics Rash    Mouth ulcers     Current Outpatient Medications:  .  Aspirin-Caffeine (BC FAST PAIN RELIEF PO), Take 1 Package by mouth daily as needed (headache). , Disp: , Rfl:  .  diclofenac (VOLTAREN) 75 MG EC tablet, TAKE 1 TABLET BY MOUTH TWICE A DAY, Disp: 60 tablet, Rfl: 0 .  HYDROcodone-acetaminophen (NORCO) 7.5-325 MG  tablet, Take 1 tablet by mouth every 6 (six) hours as needed for moderate pain (Take lowest effective dose,not recomended for long term use. Need labwork for further refills.). Reviewed controlled substance record in PMP Aware., Disp: 20 tablet, Rfl: 0 .  lactobacillus acidophilus (BACID) TABS tablet, Take 1 tablet by mouth daily., Disp: , Rfl:  .  metoprolol tartrate (LOPRESSOR) 25 MG tablet, Take 1 tablet  (25 mg total) by mouth 2 (two) times daily., Disp: 180 tablet, Rfl: 3 .  nystatin cream (MYCOSTATIN), Apply 1 application topically 2 (two) times daily. To area as discussed., Disp: 30 g, Rfl: 0 .  Probiotic Product (PROBIOTIC PO), Take by mouth daily. , Disp: , Rfl:  .  CRANBERRY PO, Take by mouth., Disp: , Rfl:  .  docusate sodium (COLACE) 100 MG capsule, Take 1 capsule (100 mg total) by mouth 2 (two) times daily. (Patient not taking: Reported on 07/20/2019), Disp: 60 capsule, Rfl: 0 .  mirabegron ER (MYRBETRIQ) 25 MG TB24 tablet, Take 1 tablet (25 mg total) by mouth daily. (Patient not taking: Reported on 07/20/2019), Disp: 30 tablet, Rfl: 11 .  promethazine (PHENERGAN) 25 MG tablet, Take 0.5 tablets (12.5 mg total) by mouth every 6 (six) hours as needed for nausea or vomiting. (Patient not taking: Reported on 07/20/2019), Disp: 30 tablet, Rfl: 0  Review of Systems  Constitutional: Positive for chills (reports resolved on 10/05/18. ). Negative for diaphoresis, fatigue, fever and unexpected weight change.  HENT: Negative.   Respiratory: Negative.   Cardiovascular: Negative.   Gastrointestinal: Negative for abdominal distention, abdominal pain, anal bleeding, blood in stool, constipation, diarrhea, nausea, rectal pain and vomiting.  Genitourinary: Positive for dysuria, flank pain (reports resolved on 10/05/18), frequency, hematuria (she reports she always has small amount of blood in urine since her procedure years ago. denies any blood more than scant. ) and hesitancy. Negative for decreased urine volume, difficulty urinating, dyspareunia, enuresis, genital sores, menstrual problem, pelvic pain, urgency, vaginal bleeding, vaginal discharge and vaginal pain.  Musculoskeletal: Negative for back pain.  Neurological: Negative.   Psychiatric/Behavioral: Negative.     Social History   Tobacco Use  . Smoking status: Former Smoker    Years: 20.00    Quit date: 09/09/1989    Years since quitting: 30.0    . Smokeless tobacco: Never Used  . Tobacco comment: QUIT IN 1990  Substance Use Topics  . Alcohol use: No    Alcohol/week: 0.0 standard drinks      Objective:   There were no vitals taken for this visit. There were no vitals filed for this visit.There is no height or weight on file to calculate BMI.   Physical Exam   Patient is alert and oriented and responsive to questions Engages in conversation with provider. Speaks in full sentences without any pauses without any shortness of breath or distress.     No results found for any visits on 10/08/19.     Assessment & Plan      ICD-10-CM   1. Symptoms involving urinary system  R39.9 Urine Culture    POCT urinalysis dipstick  2. History of blood in urine  Z87.448 Urine Culture    POCT urinalysis dipstick    Orders Placed This Encounter  Procedures  . Urine Culture  . POCT urinalysis dipstick   Will treat after urine specimen if appropriate.  Patient will come to the office for a clean catch urine, will dipstick  POCT urinalysis and send for culture given her repeat  infections.   Discussed RED flag emergent symptoms and when to seek care immediately if back pain returns, or chills she needs evaluation in person at urgent care./ emergency room.    Advised patient call the office or your primary care doctor for an appointment if no improvement within 72 hours or if any symptoms change or worsen at any time  Advised ER or urgent Care if after hours or on weekend. Call 911 for emergency symptoms at any time.Patinet verbalized understanding of all instructions given/reviewed and treatment plan and has no further questions or concerns at this time.          The entirety of the information documented in the History of Present Illness, Review of Systems and Physical Exam were personally obtained by me. Portions of this information were initially documented by the  Certified Medical Assistant whose name is documented in [place] and  reviewed by me for thoroughness and accuracy.  I have personally performed the exam and reviewed the chart and it is accurate to the best of my knowledge.  Haematologist has been used and any errors in dictation or transcription are unintentional.  Kelby Aline. Flinchum FNP-C  [facility]  I provided  20 minutes of non-face-to-face time during this encounter. Marcille Buffy, [facility]

## 2019-10-08 NOTE — Addendum Note (Signed)
Addended by: Doreen Beam on: 10/08/2019 02:36 PM   Modules accepted: Orders

## 2019-10-11 ENCOUNTER — Encounter: Payer: Self-pay | Admitting: Family Medicine

## 2019-10-12 ENCOUNTER — Telehealth: Payer: Self-pay

## 2019-10-12 ENCOUNTER — Other Ambulatory Visit: Payer: Self-pay

## 2019-10-12 DIAGNOSIS — M0579 Rheumatoid arthritis with rheumatoid factor of multiple sites without organ or systems involvement: Secondary | ICD-10-CM

## 2019-10-12 LAB — URINE CULTURE

## 2019-10-12 MED ORDER — HYDROCODONE-ACETAMINOPHEN 7.5-325 MG PO TABS: 1.0000 | ORAL_TABLET | Freq: Four times a day (QID) | ORAL | 0 refills | Status: DC | PRN

## 2019-10-12 NOTE — Telephone Encounter (Signed)
Discussed with  Alvy Beal, MD, MPH and in agreement will continue to give and patient will follow up with PCP. She has upcoming urology visit for her recurrent cystitis.

## 2019-10-12 NOTE — Telephone Encounter (Signed)
Please disregard the telephone message that was just sent, medication wasn't entered.

## 2019-10-12 NOTE — Progress Notes (Signed)
E coli in urine. Continue Keflex.  Return to the office if any symptoms persist, or worsen at anytime. Keep follow up with gynecology, and urology follow up.  Sent to [facility]  PMP was reviewed gave chronic medication refill for   Norco 7.5/325mg  # 20 take only as needed and as directed only for Rheumatoid arthritis pain, Follow up with PCP for refills.

## 2019-10-13 ENCOUNTER — Telehealth: Payer: Self-pay | Admitting: Adult Health

## 2019-10-13 MED ORDER — DOXYCYCLINE HYCLATE 100 MG PO TABS: 100.0000 mg | ORAL_TABLET | Freq: Two times a day (BID) | ORAL | 0 refills | Status: DC

## 2019-10-13 NOTE — Telephone Encounter (Signed)
Patient has been advised. KW 

## 2019-10-13 NOTE — Telephone Encounter (Signed)
Meds ordered this encounter  Medications  . doxycycline (VIBRA-TABS) 100 MG tablet    Sig: Take 1 tablet (100 mg total) by mouth 2 (two) times daily.    Dispense:  20 tablet    Refill:  0   Medications Discontinued During This Encounter  Medication Reason  . cephALEXin (KEFLEX) 500 MG capsule Completed Course    Will discontinue Keflex, she was previously on 2 weeks ago, urine shows susceptible to antibiotic above. Will start Doxycycline as above, do not lie flat within one hour of taking will cause nausea.  Change in therapy. Keep urology appointment upcoming,. Return to office as needed for persistent symptoms.

## 2019-10-24 ENCOUNTER — Encounter: Payer: Self-pay | Admitting: Family Medicine

## 2019-10-26 ENCOUNTER — Other Ambulatory Visit: Payer: Self-pay

## 2019-10-26 DIAGNOSIS — N3040 Irradiation cystitis without hematuria: Secondary | ICD-10-CM | POA: Diagnosis not present

## 2019-10-26 DIAGNOSIS — N3020 Other chronic cystitis without hematuria: Secondary | ICD-10-CM | POA: Diagnosis not present

## 2019-10-26 DIAGNOSIS — M0579 Rheumatoid arthritis with rheumatoid factor of multiple sites without organ or systems involvement: Secondary | ICD-10-CM

## 2019-10-26 MED ORDER — HYDROCODONE-ACETAMINOPHEN 7.5-325 MG PO TABS: 1.0000 | ORAL_TABLET | Freq: Four times a day (QID) | ORAL | 0 refills | Status: DC | PRN

## 2019-10-28 ENCOUNTER — Encounter: Payer: Self-pay | Admitting: Family Medicine

## 2019-10-28 MED ORDER — PROMETHAZINE HCL 25 MG PO TABS: 12.5000 mg | ORAL_TABLET | Freq: Four times a day (QID) | ORAL | 0 refills | Status: DC | PRN

## 2019-11-02 IMAGING — US US RENAL
1 series · 14 of 25 positions shown · non-contrast
Comparison: 09/11/2018 and previous

CLINICAL DATA: Left hydronephrosis

EXAM:
RENAL / URINARY TRACT ULTRASOUND COMPLETE

[Series 1: us renal · 0.23mm/px · 14 of 53 slices shown]
[im 1/53]
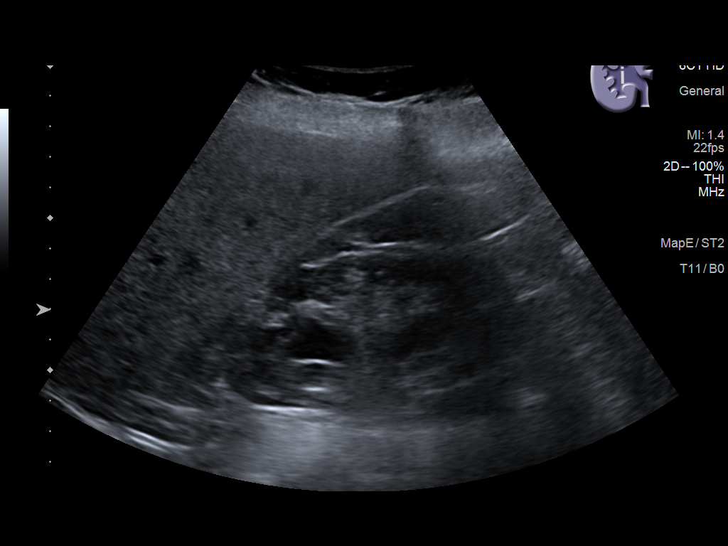
[im 5/53]
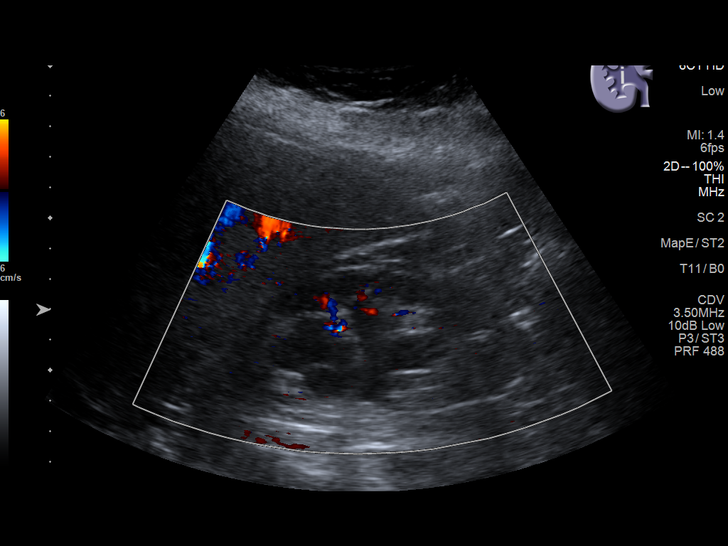
[im 9/53]
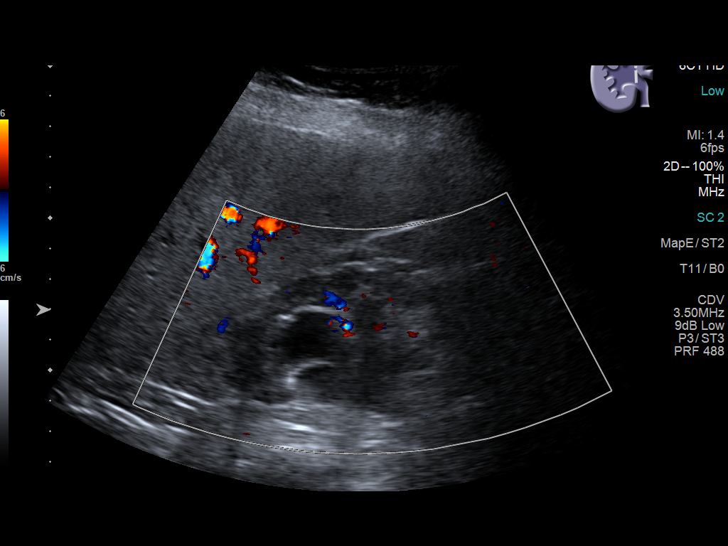
[im 14/53]
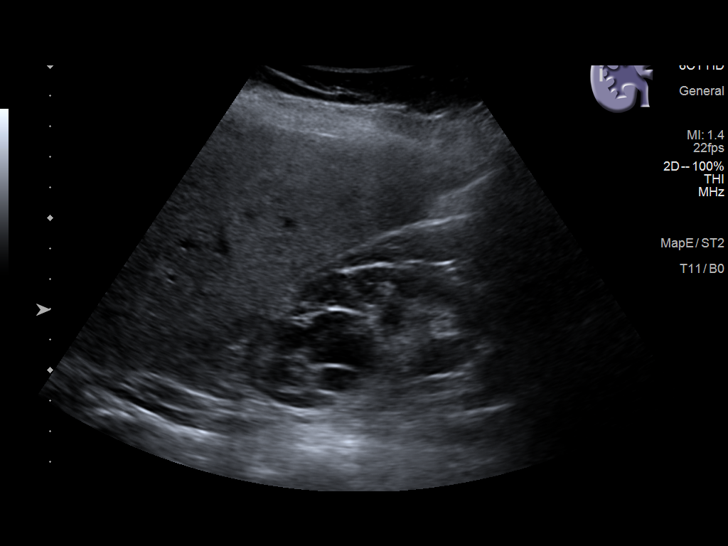
[im 18/53]
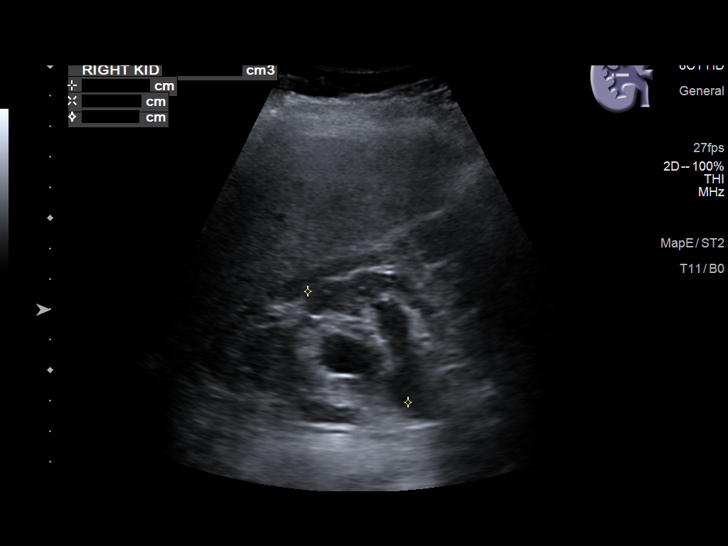
[im 20/53]
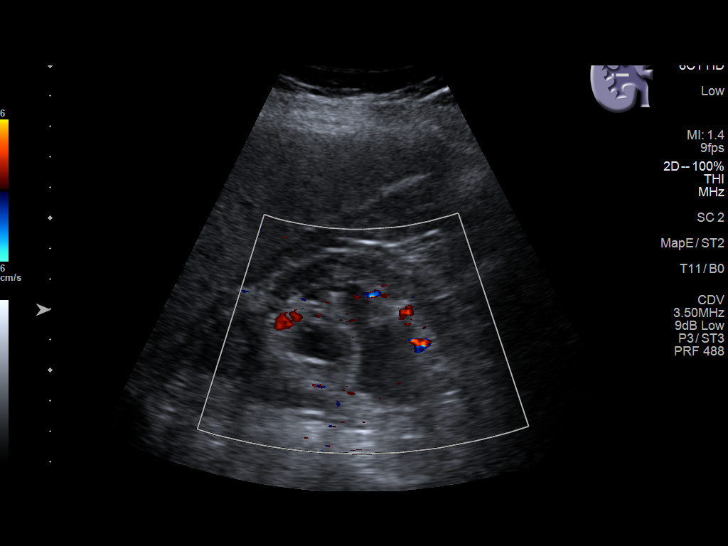
[im 24/53]
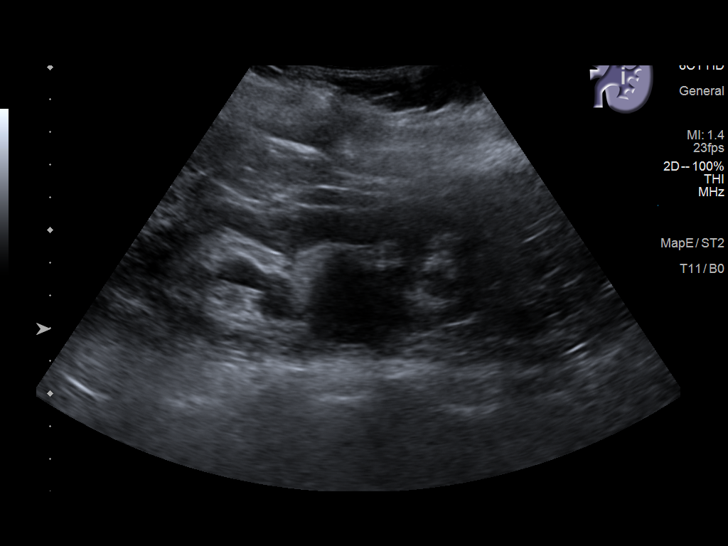
[im 29/53]
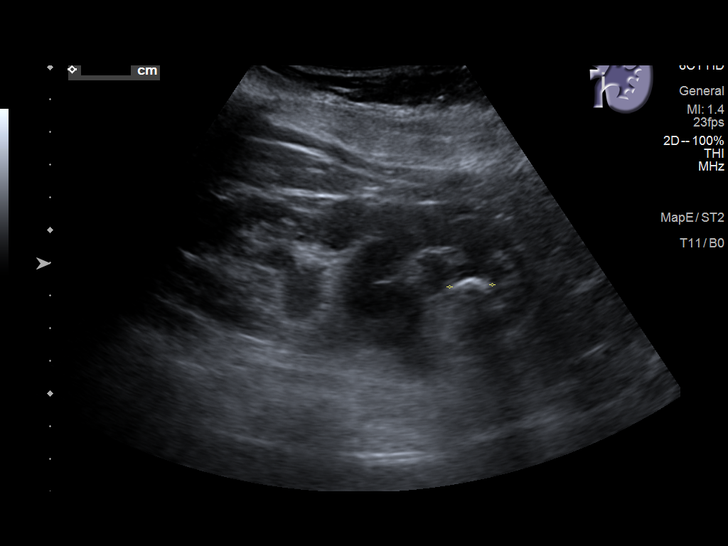
[im 33/53]
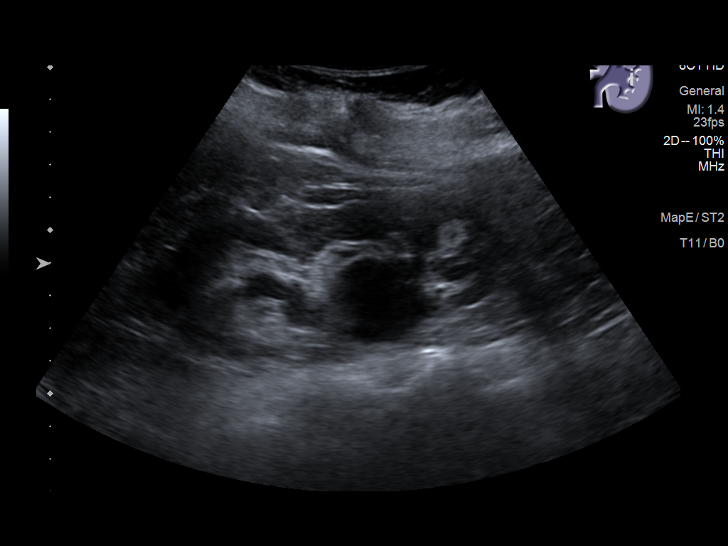
[im 35/53]
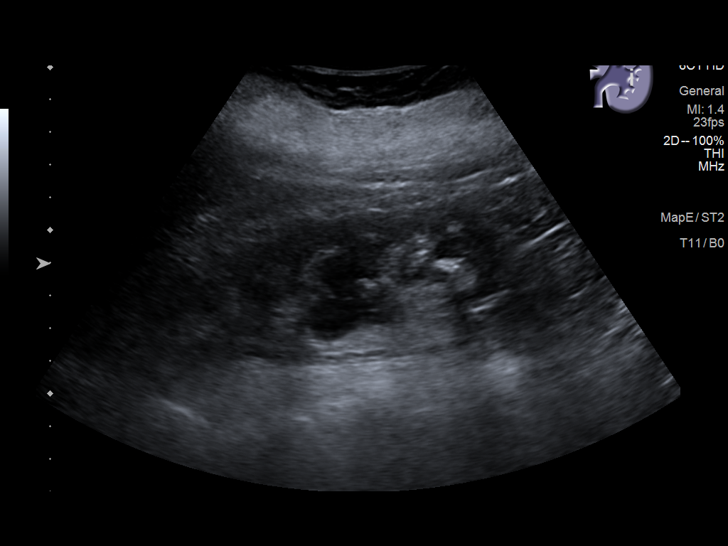
[im 40/53]
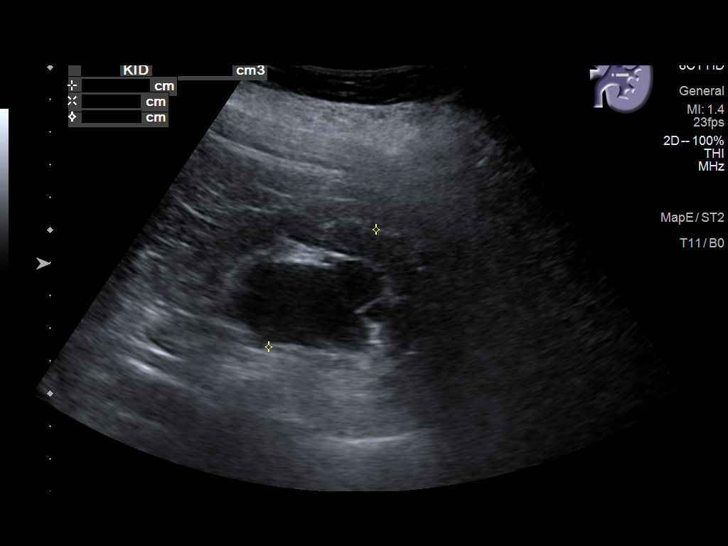
[im 44/53]
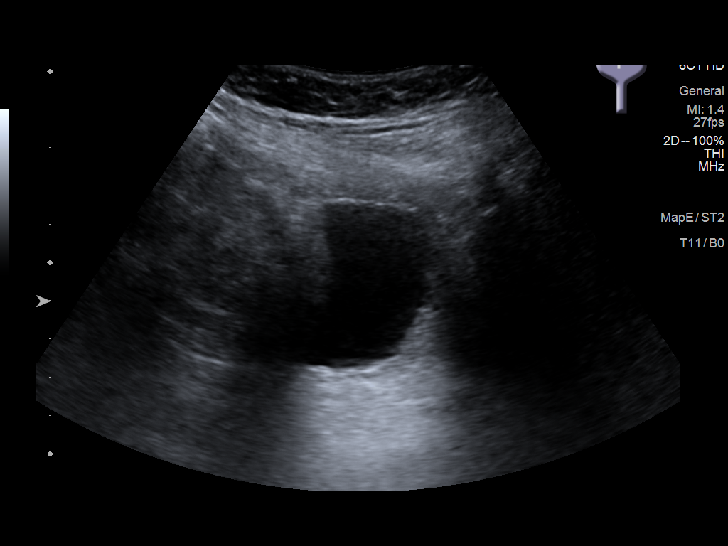
[im 48/53]
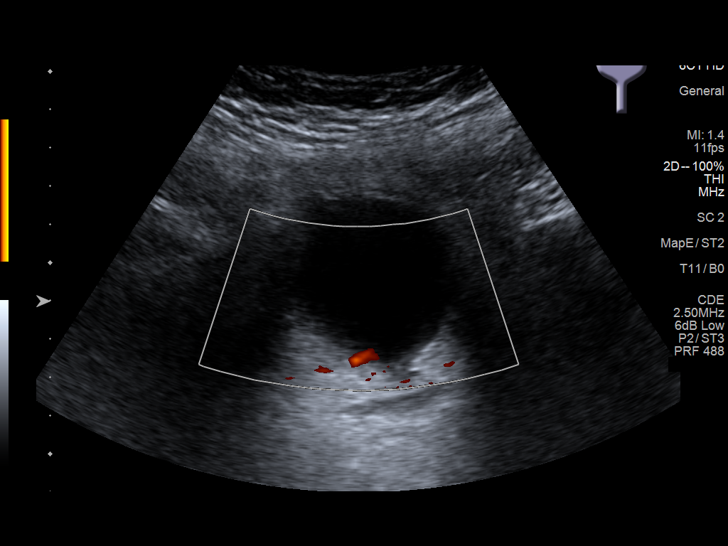
[im 53/53]
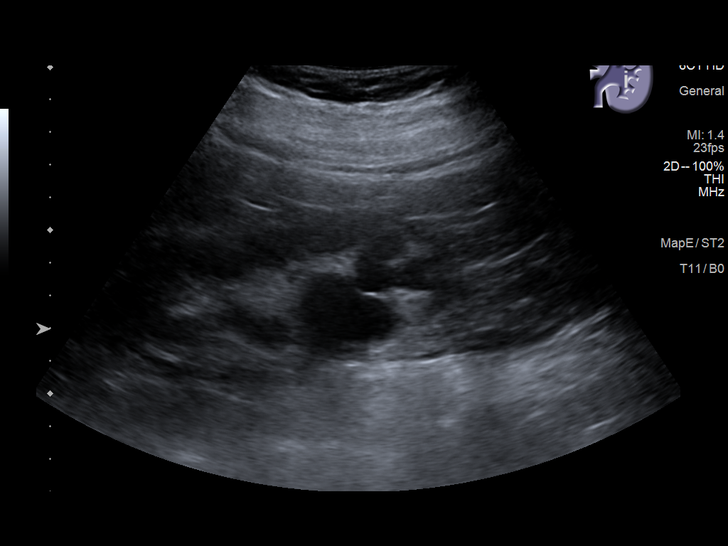

[14 of 25 positions shown; findings below may reference images not displayed]

FINDINGS: Right Kidney:

Renal measurements: 10.9 x 4.5 x 4.9 cm = volume: 127 mL. Mild
hydronephrosis versus peripelvic cysts. Normal parenchymal
echogenicity. No focal renal mass. Lower pole echogenic foci up to
6.8 mm.

Left Kidney:

Renal measurements: 11.2 x 4.4 x 4.9 cm = volume: 124 mL. Moderate
hydronephrosis versus peripelvic cysts. Normal parenchymal
echogenicity. No focal renal mass. Lower pole echogenic foci up to
13.1 mm.

Bladder:

Incompletely distended.  Bilateral ureteral jets demonstrated.
IMPRESSION: 1. Moderate left and mild right hydronephrosis versus parapelvic
cysts.
2. Bilateral ureteral jets in the urinary bladder suggesting no
high-grade ureteral obstruction.
3. Bilateral nephrolithiasis

## 2019-11-12 ENCOUNTER — Encounter: Payer: Self-pay | Admitting: Family Medicine

## 2019-11-12 ENCOUNTER — Other Ambulatory Visit: Payer: Self-pay

## 2019-11-12 DIAGNOSIS — M0579 Rheumatoid arthritis with rheumatoid factor of multiple sites without organ or systems involvement: Secondary | ICD-10-CM

## 2019-11-12 MED ORDER — HYDROCODONE-ACETAMINOPHEN 7.5-325 MG PO TABS: 1.0000 | ORAL_TABLET | Freq: Four times a day (QID) | ORAL | 0 refills | Status: DC | PRN

## 2019-11-16 ENCOUNTER — Encounter: Payer: Self-pay | Admitting: Family Medicine

## 2019-11-17 ENCOUNTER — Other Ambulatory Visit (INDEPENDENT_AMBULATORY_CARE_PROVIDER_SITE_OTHER): Payer: Medicare HMO | Admitting: *Deleted

## 2019-11-17 ENCOUNTER — Telehealth: Payer: Self-pay

## 2019-11-17 DIAGNOSIS — N3090 Cystitis, unspecified without hematuria: Secondary | ICD-10-CM

## 2019-11-17 LAB — POCT URINALYSIS DIPSTICK
Appearance: ABNORMAL
Bilirubin, UA: NEGATIVE
Glucose, UA: NEGATIVE
Ketones, UA: NEGATIVE
Nitrite, UA: POSITIVE
Odor: ABNORMAL
Protein, UA: POSITIVE — AB
Spec Grav, UA: >=1.03 — AB (ref 1.010–1.025)
Urobilinogen, UA: 0.2 E.U./dL
pH, UA: 6 (ref 5.0–8.0)

## 2019-11-17 NOTE — Telephone Encounter (Signed)
-----   Message from Margo Common, [place] sent at 11/17/2019  9:14 AM EST ----- Urine concentrated with positive nitrites and TNTC leukocytes. Need to increase water intake to flush out the urinary tract. Getting C&S started. If having burning, stinging or fever, will start Doxycycline 100 mg BID #14. If no significant symptoms, will wait for the culture report to decide on which antibiotic to use.

## 2019-11-17 NOTE — Telephone Encounter (Signed)
Pt advised.  She is not having symptoms right now and would like to wait for the culture to come back.   Thanks,   -Mickel Baas

## 2019-11-19 ENCOUNTER — Encounter: Payer: Self-pay | Admitting: Family Medicine

## 2019-11-20 ENCOUNTER — Encounter: Payer: Self-pay | Admitting: Family Medicine

## 2019-11-20 ENCOUNTER — Other Ambulatory Visit: Payer: Self-pay | Admitting: Family Medicine

## 2019-11-20 ENCOUNTER — Telehealth: Payer: Self-pay

## 2019-11-20 DIAGNOSIS — N3090 Cystitis, unspecified without hematuria: Secondary | ICD-10-CM

## 2019-11-20 LAB — CULTURE, URINE COMPREHENSIVE

## 2019-11-20 MED ORDER — DOXYCYCLINE HYCLATE 100 MG PO TABS: 100.0000 mg | ORAL_TABLET | Freq: Two times a day (BID) | ORAL | 0 refills | Status: DC

## 2019-11-20 NOTE — Telephone Encounter (Signed)
-----   Message from Margo Common, [place] sent at 11/20/2019  5:31 PM EST ----- Culture isolated E. Coli and an additional Doxycycline prescription was sent to the [place] [facility] to treat for 2 weeks. Must drink extra water to help flush out urinary tract infection. Can recheck urinalysis if any burning, stinging or fever after finishing the antibiotic.

## 2019-11-20 NOTE — Telephone Encounter (Signed)
Patient advised as directed below. 

## 2019-12-05 ENCOUNTER — Encounter: Payer: Self-pay | Admitting: Family Medicine

## 2019-12-07 ENCOUNTER — Other Ambulatory Visit: Payer: Self-pay

## 2019-12-07 ENCOUNTER — Encounter: Payer: Self-pay | Admitting: Family Medicine

## 2019-12-07 DIAGNOSIS — M0579 Rheumatoid arthritis with rheumatoid factor of multiple sites without organ or systems involvement: Secondary | ICD-10-CM

## 2019-12-07 MED ORDER — HYDROCODONE-ACETAMINOPHEN 7.5-325 MG PO TABS: 1.0000 | ORAL_TABLET | Freq: Four times a day (QID) | ORAL | 0 refills | Status: DC | PRN

## 2019-12-13 ENCOUNTER — Encounter: Payer: Self-pay | Admitting: Family Medicine

## 2019-12-13 DIAGNOSIS — N3090 Cystitis, unspecified without hematuria: Secondary | ICD-10-CM

## 2019-12-15 NOTE — Telephone Encounter (Signed)
Please check urinalysis on specimen brought in by husband. If positive for nitrites, get culture.

## 2019-12-16 ENCOUNTER — Encounter: Payer: Self-pay | Admitting: Family Medicine

## 2019-12-16 DIAGNOSIS — N3090 Cystitis, unspecified without hematuria: Secondary | ICD-10-CM | POA: Diagnosis not present

## 2019-12-16 LAB — POCT URINALYSIS DIPSTICK
Bilirubin, UA: NEGATIVE
Glucose, UA: NEGATIVE
Ketones, UA: NEGATIVE
Nitrite, UA: POSITIVE
Protein, UA: NEGATIVE
Spec Grav, UA: 1.015 (ref 1.010–1.025)
Urobilinogen, UA: 0.2 E.U./dL
pH, UA: 7.5 (ref 5.0–8.0)

## 2019-12-16 NOTE — Telephone Encounter (Signed)
Please review and advise of any new instructions. Will send out for culture. sd

## 2019-12-18 ENCOUNTER — Other Ambulatory Visit: Payer: Self-pay | Admitting: Family Medicine

## 2019-12-18 DIAGNOSIS — N3090 Cystitis, unspecified without hematuria: Secondary | ICD-10-CM

## 2019-12-18 DIAGNOSIS — M0579 Rheumatoid arthritis with rheumatoid factor of multiple sites without organ or systems involvement: Secondary | ICD-10-CM

## 2019-12-18 MED ORDER — DICLOFENAC SODIUM 75 MG PO TBEC: 75.0000 mg | DELAYED_RELEASE_TABLET | Freq: Two times a day (BID) | ORAL | 0 refills | Status: DC

## 2019-12-18 MED ORDER — DOXYCYCLINE HYCLATE 100 MG PO TABS: 100.0000 mg | ORAL_TABLET | Freq: Two times a day (BID) | ORAL | 0 refills | Status: DC

## 2019-12-19 LAB — URINE CULTURE

## 2019-12-21 NOTE — Telephone Encounter (Signed)
10 days from now. Thanks!

## 2019-12-23 ENCOUNTER — Encounter: Payer: Self-pay | Admitting: Family Medicine

## 2019-12-24 ENCOUNTER — Other Ambulatory Visit: Payer: Self-pay

## 2019-12-24 MED ORDER — PROMETHAZINE HCL 25 MG PO TABS: 12.5000 mg | ORAL_TABLET | Freq: Four times a day (QID) | ORAL | 0 refills | Status: DC | PRN

## 2019-12-28 ENCOUNTER — Other Ambulatory Visit: Payer: Self-pay | Admitting: Family Medicine

## 2019-12-28 ENCOUNTER — Encounter: Payer: Self-pay | Admitting: Family Medicine

## 2019-12-28 DIAGNOSIS — M0579 Rheumatoid arthritis with rheumatoid factor of multiple sites without organ or systems involvement: Secondary | ICD-10-CM

## 2019-12-28 MED ORDER — HYDROCODONE-ACETAMINOPHEN 7.5-325 MG PO TABS: 1.0000 | ORAL_TABLET | Freq: Four times a day (QID) | ORAL | 0 refills | Status: DC | PRN

## 2019-12-29 ENCOUNTER — Encounter: Payer: Self-pay | Admitting: Family Medicine

## 2020-01-05 ENCOUNTER — Encounter: Payer: Self-pay | Admitting: Family Medicine

## 2020-01-12 ENCOUNTER — Encounter: Payer: Self-pay | Admitting: Family Medicine

## 2020-01-12 ENCOUNTER — Other Ambulatory Visit: Payer: Self-pay

## 2020-01-12 DIAGNOSIS — N3090 Cystitis, unspecified without hematuria: Secondary | ICD-10-CM

## 2020-01-12 DIAGNOSIS — N390 Urinary tract infection, site not specified: Secondary | ICD-10-CM | POA: Diagnosis not present

## 2020-01-12 DIAGNOSIS — R319 Hematuria, unspecified: Secondary | ICD-10-CM | POA: Diagnosis not present

## 2020-01-12 NOTE — Telephone Encounter (Signed)
Yes, please.

## 2020-01-15 LAB — URINE CULTURE

## 2020-01-15 MED ORDER — DOXYCYCLINE HYCLATE 100 MG PO TABS: 100.0000 mg | ORAL_TABLET | Freq: Two times a day (BID) | ORAL | 0 refills | Status: DC

## 2020-01-26 ENCOUNTER — Other Ambulatory Visit: Payer: Self-pay | Admitting: Family Medicine

## 2020-01-26 ENCOUNTER — Encounter: Payer: Self-pay | Admitting: Family Medicine

## 2020-01-26 DIAGNOSIS — N3090 Cystitis, unspecified without hematuria: Secondary | ICD-10-CM

## 2020-01-26 DIAGNOSIS — M0579 Rheumatoid arthritis with rheumatoid factor of multiple sites without organ or systems involvement: Secondary | ICD-10-CM

## 2020-01-26 MED ORDER — DOXYCYCLINE HYCLATE 100 MG PO TABS: 100.0000 mg | ORAL_TABLET | Freq: Two times a day (BID) | ORAL | 0 refills | Status: DC

## 2020-01-26 MED ORDER — HYDROCODONE-ACETAMINOPHEN 7.5-325 MG PO TABS: 1.0000 | ORAL_TABLET | Freq: Four times a day (QID) | ORAL | 0 refills | Status: DC | PRN

## 2020-01-28 ENCOUNTER — Encounter: Payer: Self-pay | Admitting: Family Medicine

## 2020-01-29 ENCOUNTER — Other Ambulatory Visit: Payer: Self-pay | Admitting: Family Medicine

## 2020-01-29 DIAGNOSIS — R102 Pelvic and perineal pain: Secondary | ICD-10-CM

## 2020-01-29 DIAGNOSIS — Z923 Personal history of irradiation: Secondary | ICD-10-CM

## 2020-01-29 MED ORDER — LIDOCAINE-PRILOCAINE 2.5-2.5 % EX CREA: TOPICAL_CREAM | CUTANEOUS | 0 refills | Status: DC

## 2020-02-05 ENCOUNTER — Other Ambulatory Visit: Payer: Self-pay | Admitting: Family Medicine

## 2020-02-05 DIAGNOSIS — Z923 Personal history of irradiation: Secondary | ICD-10-CM

## 2020-02-05 DIAGNOSIS — M0579 Rheumatoid arthritis with rheumatoid factor of multiple sites without organ or systems involvement: Secondary | ICD-10-CM

## 2020-02-05 DIAGNOSIS — R102 Pelvic and perineal pain: Secondary | ICD-10-CM

## 2020-02-05 NOTE — Telephone Encounter (Signed)
Requested medication (s) are due for refill today - unsure  Requested medication (s) are on the active medication list -yes  Future visit scheduled -no  Last refill: 01/29/20  Notes to clinic: Request for medication not assigned protocol.  Requested Prescriptions  Pending Prescriptions Disp Refills   lidocaine-prilocaine (EMLA) cream [Pharmacy Med Name: LIDOCAINE-PRILOCAINE CREAM] 30 g 0    Sig: Apply sparingly 2-3 times a day to area of radiation therapy irritation.      Off-Protocol Failed - 02/05/2020  8:47 AM      Failed - Medication not assigned to a protocol, review manually.      Passed - Valid encounter within last 12 months    Recent Outpatient Visits           4 months ago Symptoms involving urinary system   [place] Flinchum, Kelby Aline, FNP   4 months ago Erroneous encounter - disregard   [facility], Kelby Aline, FNP   5 months ago Herpes zoster without complication   [place] Flinchum, Kelby Aline, FNP   6 months ago Annual physical exam   [place], [place]   1 year ago Recurrent UTI   [facility], [place], [place]                  Requested Prescriptions  Pending Prescriptions Disp Refills   lidocaine-prilocaine (EMLA) cream [Pharmacy Med Name: LIDOCAINE-PRILOCAINE CREAM] 30 g 0    Sig: Apply sparingly 2-3 times a day to area of radiation therapy irritation.      Off-Protocol Failed - 02/05/2020  8:47 AM      Failed - Medication not assigned to a protocol, review manually.      Passed - Valid encounter within last 12 months    Recent Outpatient Visits           4 months ago Symptoms involving urinary system   [place] Flinchum, Kelby Aline, FNP   4 months ago Erroneous encounter - disregard   [facility] Flinchum, Kelby Aline, FNP   5 months ago Herpes zoster without complication   [place] Flinchum, Kelby Aline, FNP   6 months ago Annual physical exam   [place], [place]   1 year ago Recurrent UTI   [place], Vickki Muff, [place]

## 2020-02-14 ENCOUNTER — Other Ambulatory Visit: Payer: Self-pay | Admitting: Family Medicine

## 2020-02-14 DIAGNOSIS — I10 Essential (primary) hypertension: Secondary | ICD-10-CM

## 2020-02-14 NOTE — Telephone Encounter (Signed)
Requested Prescriptions  Pending Prescriptions Disp Refills   metoprolol tartrate (LOPRESSOR) 25 MG tablet [Pharmacy Med Name: METOPROLOL TARTRATE 25 MG TAB] 180 tablet 3    Sig: TAKE 1 TABLET BY MOUTH TWICE A DAY     Cardiovascular:  Beta Blockers Passed - 02/14/2020  9:05 AM      Passed - Last BP in normal range    BP Readings from Last 1 Encounters:  07/20/19 132/86         Passed - Last Heart Rate in normal range    Pulse Readings from Last 1 Encounters:  07/20/19 81         Passed - Valid encounter within last 6 months    Recent Outpatient Visits          4 months ago Symptoms involving urinary system   [place] Flinchum, Kelby Aline, FNP   4 months ago Erroneous encounter - disregard   [place], FNP   5 months ago Herpes zoster without complication   [facility], Kelby Aline, FNP   6 months ago Annual physical exam   [place], [place]   1 year ago Recurrent UTI   [facility], Vickki Muff, [place]

## 2020-02-18 ENCOUNTER — Encounter: Payer: Self-pay | Admitting: Family Medicine

## 2020-02-18 ENCOUNTER — Other Ambulatory Visit (INDEPENDENT_AMBULATORY_CARE_PROVIDER_SITE_OTHER): Payer: Medicare HMO

## 2020-02-18 DIAGNOSIS — N3040 Irradiation cystitis without hematuria: Secondary | ICD-10-CM

## 2020-02-18 LAB — POCT URINALYSIS DIPSTICK
Appearance: ABNORMAL
Bilirubin, UA: NEGATIVE
Blood, UA: POSITIVE
Glucose, UA: NEGATIVE
Ketones, UA: NEGATIVE
Nitrite, UA: NEGATIVE
Odor: ABNORMAL
Protein, UA: NEGATIVE
Spec Grav, UA: 1.02 (ref 1.010–1.025)
Urobilinogen, UA: 0.2 E.U./dL
pH, UA: 6 (ref 5.0–8.0)

## 2020-02-21 ENCOUNTER — Encounter (HOSPITAL_COMMUNITY): Payer: Self-pay | Admitting: Emergency Medicine

## 2020-02-21 ENCOUNTER — Encounter: Payer: Self-pay | Admitting: Family Medicine

## 2020-02-21 ENCOUNTER — Other Ambulatory Visit: Payer: Self-pay

## 2020-02-21 ENCOUNTER — Emergency Department (HOSPITAL_COMMUNITY)
Admission: EM | Admit: 2020-02-21 | Discharge: 2020-02-21 | Discharge status: Against Medical Advice | Payer: Medicare HMO | Attending: Emergency Medicine | Admitting: Emergency Medicine

## 2020-02-21 DIAGNOSIS — Z5321 Procedure and treatment not carried out due to patient leaving prior to being seen by health care provider: Secondary | ICD-10-CM | POA: Diagnosis not present

## 2020-02-21 DIAGNOSIS — R300 Dysuria: Secondary | ICD-10-CM | POA: Insufficient documentation

## 2020-02-21 NOTE — ED Triage Notes (Signed)
Pt reports that due to having radiation always has cystitis. C/o pain and blood in urine, shaking, chills, and numbness in fingers. Pt reports took urine specimen to her PCP on Friday but wasn't prescribed any new medications.

## 2020-02-22 ENCOUNTER — Encounter: Payer: Self-pay | Admitting: Family Medicine

## 2020-02-22 ENCOUNTER — Other Ambulatory Visit: Payer: Self-pay | Admitting: Family Medicine

## 2020-02-22 DIAGNOSIS — N3090 Cystitis, unspecified without hematuria: Secondary | ICD-10-CM

## 2020-02-22 MED ORDER — DOXYCYCLINE HYCLATE 100 MG PO TABS: 100.0000 mg | ORAL_TABLET | Freq: Two times a day (BID) | ORAL | 0 refills | Status: DC

## 2020-02-23 ENCOUNTER — Other Ambulatory Visit: Payer: Self-pay | Admitting: Family Medicine

## 2020-02-23 ENCOUNTER — Telehealth: Payer: Self-pay

## 2020-02-23 DIAGNOSIS — M0579 Rheumatoid arthritis with rheumatoid factor of multiple sites without organ or systems involvement: Secondary | ICD-10-CM

## 2020-02-23 DIAGNOSIS — R102 Pelvic and perineal pain: Secondary | ICD-10-CM

## 2020-02-23 DIAGNOSIS — Z923 Personal history of irradiation: Secondary | ICD-10-CM

## 2020-02-23 DIAGNOSIS — N3090 Cystitis, unspecified without hematuria: Secondary | ICD-10-CM

## 2020-02-23 LAB — URINE CULTURE

## 2020-02-23 MED ORDER — HYDROCODONE-ACETAMINOPHEN 7.5-325 MG PO TABS: 1.0000 | ORAL_TABLET | Freq: Four times a day (QID) | ORAL | 0 refills | Status: DC | PRN

## 2020-02-23 NOTE — Telephone Encounter (Signed)
Patient was advised and states that she seen results on MyChart as well. Patient agrees with the urologist referral as well. FYI

## 2020-02-23 NOTE — Telephone Encounter (Signed)
Placed order for referral in [place] or at [facility].

## 2020-02-23 NOTE — Telephone Encounter (Signed)
-----   Message from Margo Common, [place] sent at 02/22/2020  5:09 PM EDT ----- Culture still working on sensitivity report. Preliminary report showed only generic gram negative rod-type bacteria. Similar look as the Klebsiella bacteria seen on the May 2021 culture. At that time it was resistant to many oral antibiotics except the Doxycycline. Will try it again since you are having discomfort. If the final culture report shows something different, may need referral back to the urologist.

## 2020-02-24 ENCOUNTER — Telehealth: Payer: Self-pay

## 2020-02-24 ENCOUNTER — Encounter: Payer: Self-pay | Admitting: Family Medicine

## 2020-02-24 NOTE — Telephone Encounter (Signed)
-----   Message from Margo Common, [place] sent at 02/23/2020  5:30 PM EDT ----- Final culture report showed E.coli again that is resistant to many oral antibiotics but sensitive to the tetracycline (Doxycycline). Take all the prescription given and have placed order for urology referral for chronic recurring UTI's.

## 2020-02-24 NOTE — Telephone Encounter (Signed)
Patient advised as below. Patient verbalizes understanding and is in agreement with treatment plan.  

## 2020-02-25 ENCOUNTER — Other Ambulatory Visit: Payer: Self-pay | Admitting: Family Medicine

## 2020-02-25 DIAGNOSIS — R102 Pelvic and perineal pain: Secondary | ICD-10-CM

## 2020-02-25 DIAGNOSIS — N3090 Cystitis, unspecified without hematuria: Secondary | ICD-10-CM

## 2020-02-25 DIAGNOSIS — Z923 Personal history of irradiation: Secondary | ICD-10-CM

## 2020-02-25 NOTE — Progress Notes (Signed)
Patient requests changing urology referral from Dr. Eliberto Ivory to Dr. Terance Hart (at [facility]).

## 2020-02-28 ENCOUNTER — Other Ambulatory Visit: Payer: Self-pay | Admitting: Family Medicine

## 2020-02-28 DIAGNOSIS — M0579 Rheumatoid arthritis with rheumatoid factor of multiple sites without organ or systems involvement: Secondary | ICD-10-CM

## 2020-02-28 NOTE — Telephone Encounter (Signed)
Requested Prescriptions  Pending Prescriptions Disp Refills  . diclofenac (VOLTAREN) 75 MG EC tablet [Pharmacy Med Name: DICLOFENAC SOD EC 75 MG TAB] 60 tablet 0    Sig: TAKE 1 TABLET BY MOUTH TWICE A DAY     Analgesics:  NSAIDS Passed - 02/28/2020  9:40 AM      Passed - Cr in normal range and within 360 days    Creatinine, Ser  Date Value Ref Range Status  10/02/2019 0.83 0.57 - 1.00 mg/dL Final         Passed - HGB in normal range and within 360 days    Hemoglobin  Date Value Ref Range Status  10/02/2019 13.9 11.1 - 15.9 g/dL Final         Passed - Patient is not pregnant      Passed - Valid encounter within last 12 months    Recent Outpatient Visits          4 months ago Symptoms involving urinary system   [place] [facility], Kelby Aline, FNP   5 months ago Erroneous encounter - disregard   [place], FNP   6 months ago Herpes zoster without complication   [facility], Kelby Aline, FNP   7 months ago Annual physical exam   [place], [place]   1 year ago Recurrent UTI   [facility], Vickki Muff, [place]

## 2020-02-29 ENCOUNTER — Other Ambulatory Visit: Payer: Self-pay | Admitting: Family Medicine

## 2020-02-29 DIAGNOSIS — R102 Pelvic and perineal pain: Secondary | ICD-10-CM

## 2020-02-29 DIAGNOSIS — Z923 Personal history of irradiation: Secondary | ICD-10-CM

## 2020-02-29 NOTE — Telephone Encounter (Signed)
Requested medication (s) are due for refill today: Yes  Requested medication (s) are on the active medication list: Yes  Last refill:  01/16/20 and 12/24/19  Future visit scheduled: No  Notes to clinic:  Unable to refill per protocol, cannot delegate, no protocol assigned.     Requested Prescriptions  Pending Prescriptions Disp Refills   promethazine (PHENERGAN) 25 MG tablet [Pharmacy Med Name: PROMETHAZINE 25 MG TABLET] 30 tablet 0    Sig: Take 0.5 tablets (12.5 mg total) by mouth every 6 (six) hours as needed for nausea or vomiting.      Not Delegated - Gastroenterology: Antiemetics Failed - 02/29/2020  4:12 PM      Failed - This refill cannot be delegated      Passed - Valid encounter within last 6 months    Recent Outpatient Visits           4 months ago Symptoms involving urinary system   [place] [facility], Kelby Aline, FNP   5 months ago Erroneous encounter - disregard   [facility], Kelby Aline, FNP   6 months ago Herpes zoster without complication   [facility], Kelby Aline, FNP   7 months ago Annual physical exam   [place], [place]   1 year ago Recurrent UTI   [facility], [place], [place]                lidocaine-prilocaine (EMLA) cream [Pharmacy Med Name: LIDOCAINE-PRILOCAINE CREAM] 30 g 0    Sig: APPLY SPARINGLY 2-3 TIMES A DAY TO AREA OF RADIATION THERAPY IRRITATION.      Off-Protocol Failed - 02/29/2020  4:12 PM      Failed - Medication not assigned to a protocol, review manually.      Passed - Valid encounter within last 12 months    Recent Outpatient Visits           4 months ago Symptoms involving urinary system   [place] [facility], Kelby Aline, FNP   5 months ago Erroneous encounter - disregard   [place], FNP   6 months ago Herpes zoster without complication   [place] Fl[facility], Kelby Aline, FNP   7 months ago Annual physical exam   [place], [place]   1 year ago Recurrent UTI   [place], Vickki Muff, [place]

## 2020-03-02 ENCOUNTER — Encounter: Payer: Self-pay | Admitting: Family Medicine

## 2020-03-04 ENCOUNTER — Other Ambulatory Visit: Payer: Self-pay | Admitting: *Deleted

## 2020-03-04 DIAGNOSIS — N390 Urinary tract infection, site not specified: Secondary | ICD-10-CM | POA: Diagnosis not present

## 2020-03-08 ENCOUNTER — Encounter: Payer: Self-pay | Admitting: Family Medicine

## 2020-03-08 ENCOUNTER — Other Ambulatory Visit: Payer: Self-pay | Admitting: Family Medicine

## 2020-03-08 DIAGNOSIS — N3090 Cystitis, unspecified without hematuria: Secondary | ICD-10-CM

## 2020-03-08 MED ORDER — DOXYCYCLINE HYCLATE 100 MG PO TABS: 100.0000 mg | ORAL_TABLET | Freq: Two times a day (BID) | ORAL | 0 refills | Status: DC

## 2020-03-09 LAB — CULTURE, URINE COMPREHENSIVE

## 2020-03-10 ENCOUNTER — Other Ambulatory Visit: Payer: Self-pay

## 2020-03-10 DIAGNOSIS — N390 Urinary tract infection, site not specified: Secondary | ICD-10-CM

## 2020-03-10 DIAGNOSIS — N3090 Cystitis, unspecified without hematuria: Secondary | ICD-10-CM

## 2020-03-10 MED ORDER — CIPROFLOXACIN HCL 500 MG PO TABS: 500.0000 mg | ORAL_TABLET | Freq: Two times a day (BID) | ORAL | 0 refills | Status: DC

## 2020-03-10 NOTE — Telephone Encounter (Signed)
Pt called in and was given the message from [facility] PA inregards to her urine result.   Dated 03/10/2020 at 8:19 AM.  She will check with her pharmacy.   Verbalized understanding of instructions.

## 2020-03-10 NOTE — Telephone Encounter (Signed)
-----   Message from Margo [place], [place] sent at 03/10/2020  8:19 AM EDT ----- Final report of recent culture isolated Morganella bacteria instead of E.coli. This bacteria is resistant to the Doxycycline. Need to switch to Ciprofloxacin 500 mg BID #20.

## 2020-03-10 NOTE — Telephone Encounter (Signed)
Tried calling patient. Left message to call back. OK for [facility] to advise of results and send prescription into pharmacy.

## 2020-03-16 ENCOUNTER — Encounter: Payer: Self-pay | Admitting: Family Medicine

## 2020-03-16 DIAGNOSIS — M0579 Rheumatoid arthritis with rheumatoid factor of multiple sites without organ or systems involvement: Secondary | ICD-10-CM

## 2020-03-17 MED ORDER — HYDROCODONE-ACETAMINOPHEN 7.5-325 MG PO TABS: 1.0000 | ORAL_TABLET | Freq: Four times a day (QID) | ORAL | 0 refills | Status: DC | PRN

## 2020-03-24 ENCOUNTER — Other Ambulatory Visit: Payer: Self-pay | Admitting: Family Medicine

## 2020-03-24 DIAGNOSIS — M0579 Rheumatoid arthritis with rheumatoid factor of multiple sites without organ or systems involvement: Secondary | ICD-10-CM

## 2020-03-24 NOTE — Telephone Encounter (Signed)
Requested  medications are  due for refill today yes  Requested medications are on the active medication list yes  Last refill 6/20  Future visit scheduled no  Notes to clinic Pt has had visit within the 12 month period but this med was filled for just one month last month and pt has not been seen. Was not sure why only gave one month so was unsure if should refill.

## 2020-03-28 ENCOUNTER — Encounter: Payer: Self-pay | Admitting: Family Medicine

## 2020-03-29 ENCOUNTER — Other Ambulatory Visit: Payer: Self-pay | Admitting: Family Medicine

## 2020-03-29 DIAGNOSIS — Z923 Personal history of irradiation: Secondary | ICD-10-CM

## 2020-03-29 DIAGNOSIS — R102 Pelvic and perineal pain: Secondary | ICD-10-CM

## 2020-04-14 ENCOUNTER — Other Ambulatory Visit (INDEPENDENT_AMBULATORY_CARE_PROVIDER_SITE_OTHER): Payer: Medicare HMO

## 2020-04-14 ENCOUNTER — Encounter: Payer: Self-pay | Admitting: Family Medicine

## 2020-04-14 DIAGNOSIS — N3040 Irradiation cystitis without hematuria: Secondary | ICD-10-CM | POA: Diagnosis not present

## 2020-04-14 DIAGNOSIS — N3090 Cystitis, unspecified without hematuria: Secondary | ICD-10-CM

## 2020-04-14 LAB — POCT URINALYSIS DIPSTICK
Bilirubin, UA: NEGATIVE
Glucose, UA: NEGATIVE
Ketones, UA: NEGATIVE
Nitrite, UA: POSITIVE
Protein, UA: NEGATIVE
Spec Grav, UA: 1.015 (ref 1.010–1.025)
Urobilinogen, UA: 1 E.U./dL
pH, UA: 7 (ref 5.0–8.0)

## 2020-04-14 MED ORDER — DOXYCYCLINE HYCLATE 100 MG PO TABS: 100.0000 mg | ORAL_TABLET | Freq: Two times a day (BID) | ORAL | 0 refills | Status: DC

## 2020-04-14 NOTE — Progress Notes (Signed)
Culture ordered

## 2020-04-14 NOTE — Addendum Note (Signed)
Addended by: Mar Daring on: 04/14/2020 10:53 AM   Modules accepted: Orders

## 2020-04-19 ENCOUNTER — Encounter: Payer: Self-pay | Admitting: Family Medicine

## 2020-04-20 ENCOUNTER — Other Ambulatory Visit: Payer: Self-pay

## 2020-04-20 DIAGNOSIS — M0579 Rheumatoid arthritis with rheumatoid factor of multiple sites without organ or systems involvement: Secondary | ICD-10-CM

## 2020-04-20 MED ORDER — HYDROCODONE-ACETAMINOPHEN 7.5-325 MG PO TABS: 1.0000 | ORAL_TABLET | Freq: Four times a day (QID) | ORAL | 0 refills | Status: DC | PRN

## 2020-04-21 LAB — URINE CULTURE

## 2020-05-03 ENCOUNTER — Encounter: Payer: Self-pay | Admitting: Family Medicine

## 2020-05-10 ENCOUNTER — Encounter: Payer: Self-pay | Admitting: Family Medicine

## 2020-05-10 ENCOUNTER — Other Ambulatory Visit: Payer: Self-pay | Admitting: Family Medicine

## 2020-05-10 DIAGNOSIS — R102 Pelvic and perineal pain: Secondary | ICD-10-CM

## 2020-05-10 DIAGNOSIS — Z923 Personal history of irradiation: Secondary | ICD-10-CM

## 2020-05-10 NOTE — Telephone Encounter (Signed)
Copied from [place] [id number]. Topic: General - Other >> May 10, 2020  2:23 PM [name] wrote: Reason for CRM: Patient called to speak with the nurse regarding her bladder infection.  She requested Ivanna or Nicki to call her.  CB# [phone].

## 2020-05-10 NOTE — Telephone Encounter (Signed)
Requested medications are due for refill today?  Not on active medication list.  Medication refill cannot be delegated.    Requested medications are on active medication list?  No   Last Refill:   Per medication history 12/24/2019  # 30 with no refills.   Future visit scheduled?  No   Notes to Clinic: This medication refill cannot be delegated.

## 2020-05-10 NOTE — Telephone Encounter (Signed)
Requested medication (s) are due for refill today: yes  Requested medication (s) are on the active medication list: yes  Last refill:  03/29/20  Future visit scheduled: no  Notes to clinic:  medication not assigned to a protocol   Requested Prescriptions  Pending Prescriptions Disp Refills   lidocaine-prilocaine (EMLA) cream [Pharmacy Med Name: LIDOCAINE-PRILOCAINE CREAM] 30 g 0    Sig: APPLY SPARINGLY 2-3 TIMES A DAY TO AREA OF RADIATION THERAPY IRRITATION.      Off-Protocol Failed - 05/10/2020  4:53 PM      Failed - Medication not assigned to a protocol, review manually.      Passed - Valid encounter within last 12 months    Recent Outpatient Visits           7 months ago Symptoms involving urinary system   [facility], Kelby Aline, FNP   7 months ago Erroneous encounter - disregard   [place], FNP   8 months ago Herpes zoster without complication   [facility], Kelby Aline, FNP   9 months ago Annual physical exam   [place], [place]   1 year ago Recurrent UTI   [place], Vickki Muff, [place]

## 2020-05-11 ENCOUNTER — Other Ambulatory Visit: Payer: Self-pay

## 2020-05-11 DIAGNOSIS — N3040 Irradiation cystitis without hematuria: Secondary | ICD-10-CM

## 2020-05-13 ENCOUNTER — Encounter: Payer: Self-pay | Admitting: Family Medicine

## 2020-05-13 DIAGNOSIS — M0579 Rheumatoid arthritis with rheumatoid factor of multiple sites without organ or systems involvement: Secondary | ICD-10-CM

## 2020-05-13 MED ORDER — HYDROCODONE-ACETAMINOPHEN 7.5-325 MG PO TABS: 1.0000 | ORAL_TABLET | Freq: Four times a day (QID) | ORAL | 0 refills | Status: DC | PRN

## 2020-05-15 LAB — URINE CULTURE

## 2020-05-16 ENCOUNTER — Encounter: Payer: Self-pay | Admitting: Family Medicine

## 2020-05-17 ENCOUNTER — Other Ambulatory Visit: Payer: Self-pay | Admitting: Physician Assistant

## 2020-05-17 DIAGNOSIS — N3090 Cystitis, unspecified without hematuria: Secondary | ICD-10-CM

## 2020-05-17 MED ORDER — CIPROFLOXACIN HCL 500 MG PO TABS: 500.0000 mg | ORAL_TABLET | Freq: Two times a day (BID) | ORAL | 0 refills | Status: DC

## 2020-05-17 NOTE — Telephone Encounter (Signed)
Already addressed and Cipro sent in

## 2020-05-17 NOTE — Telephone Encounter (Signed)
Pt calling regarding the results uploaded to her mychart. Please advise.

## 2020-05-17 NOTE — Progress Notes (Signed)
Cipro sent in for UTI per sensitivities

## 2020-05-19 NOTE — Progress Notes (Signed)
Patient has been advised. KW 

## 2020-05-22 ENCOUNTER — Encounter: Payer: Self-pay | Admitting: Family Medicine

## 2020-05-24 ENCOUNTER — Encounter: Payer: Self-pay | Admitting: Family Medicine

## 2020-05-24 NOTE — Telephone Encounter (Signed)
Please do an urinalysis on this patient's urine specimen brought in by her husband. Send for C&S if indicated by results.

## 2020-05-25 ENCOUNTER — Other Ambulatory Visit: Payer: Self-pay

## 2020-05-25 DIAGNOSIS — N390 Urinary tract infection, site not specified: Secondary | ICD-10-CM | POA: Diagnosis not present

## 2020-05-26 ENCOUNTER — Encounter: Payer: Self-pay | Admitting: Family Medicine

## 2020-05-29 LAB — URINE CULTURE

## 2020-05-30 ENCOUNTER — Other Ambulatory Visit: Payer: Self-pay | Admitting: Family Medicine

## 2020-05-30 ENCOUNTER — Encounter: Payer: Self-pay | Admitting: Family Medicine

## 2020-05-30 MED ORDER — CEFUROXIME AXETIL 250 MG PO TABS: 250.0000 mg | ORAL_TABLET | Freq: Two times a day (BID) | ORAL | 0 refills | Status: DC

## 2020-06-09 ENCOUNTER — Encounter: Payer: Self-pay | Admitting: Family Medicine

## 2020-06-09 NOTE — Telephone Encounter (Signed)
Husband will bring in urine specimen tomorrow to recheck culture for cure.

## 2020-06-10 ENCOUNTER — Encounter: Payer: Self-pay | Admitting: Family Medicine

## 2020-06-10 ENCOUNTER — Other Ambulatory Visit (INDEPENDENT_AMBULATORY_CARE_PROVIDER_SITE_OTHER): Payer: Medicare HMO | Admitting: Family Medicine

## 2020-06-10 DIAGNOSIS — M0579 Rheumatoid arthritis with rheumatoid factor of multiple sites without organ or systems involvement: Secondary | ICD-10-CM

## 2020-06-10 DIAGNOSIS — N3040 Irradiation cystitis without hematuria: Secondary | ICD-10-CM

## 2020-06-10 DIAGNOSIS — N390 Urinary tract infection, site not specified: Secondary | ICD-10-CM | POA: Diagnosis not present

## 2020-06-10 LAB — POCT URINALYSIS DIPSTICK
Glucose, UA: NEGATIVE
Spec Grav, UA: 1.015 (ref 1.010–1.025)
pH, UA: 6 (ref 5.0–8.0)

## 2020-06-10 MED ORDER — HYDROCODONE-ACETAMINOPHEN 7.5-325 MG PO TABS: 1.0000 | ORAL_TABLET | Freq: Four times a day (QID) | ORAL | 0 refills | Status: DC | PRN

## 2020-06-12 LAB — URINE CULTURE

## 2020-06-13 ENCOUNTER — Encounter: Payer: Self-pay | Admitting: Family Medicine

## 2020-06-14 ENCOUNTER — Other Ambulatory Visit: Payer: Self-pay

## 2020-06-14 ENCOUNTER — Telehealth: Payer: Self-pay

## 2020-06-14 DIAGNOSIS — N3000 Acute cystitis without hematuria: Secondary | ICD-10-CM

## 2020-06-14 DIAGNOSIS — N3040 Irradiation cystitis without hematuria: Secondary | ICD-10-CM

## 2020-06-14 NOTE — Telephone Encounter (Signed)
Copied from [place] [id number]. Topic: General - Other >> Jun 14, 2020 10:31 AM Rainey Pines A wrote: Patient would like Jiles Garter to respond back to her most recent [employer] on mychart. Please advise

## 2020-06-14 NOTE — Telephone Encounter (Signed)
Patient has odor and symptoms.  Wants another test done.

## 2020-06-14 NOTE — Telephone Encounter (Signed)
OK and should schedule follow up appointment.

## 2020-06-15 DIAGNOSIS — N3040 Irradiation cystitis without hematuria: Secondary | ICD-10-CM | POA: Diagnosis not present

## 2020-06-19 ENCOUNTER — Encounter: Payer: Self-pay | Admitting: Family Medicine

## 2020-06-19 LAB — URINE CULTURE

## 2020-06-20 ENCOUNTER — Other Ambulatory Visit: Payer: Self-pay | Admitting: Family Medicine

## 2020-06-20 DIAGNOSIS — N3090 Cystitis, unspecified without hematuria: Secondary | ICD-10-CM

## 2020-06-20 MED ORDER — DOXYCYCLINE HYCLATE 100 MG PO TABS: 100.0000 mg | ORAL_TABLET | Freq: Two times a day (BID) | ORAL | 0 refills | Status: DC

## 2020-07-07 ENCOUNTER — Other Ambulatory Visit: Payer: Self-pay

## 2020-07-07 ENCOUNTER — Ambulatory Visit (INDEPENDENT_AMBULATORY_CARE_PROVIDER_SITE_OTHER): Payer: Medicare HMO | Admitting: Family Medicine

## 2020-07-07 VITALS — BP 136/63 | HR 69 | Temp 98.4°F

## 2020-07-07 DIAGNOSIS — I10 Essential (primary) hypertension: Secondary | ICD-10-CM | POA: Diagnosis not present

## 2020-07-07 DIAGNOSIS — N3020 Other chronic cystitis without hematuria: Secondary | ICD-10-CM

## 2020-07-07 DIAGNOSIS — M0579 Rheumatoid arthritis with rheumatoid factor of multiple sites without organ or systems involvement: Secondary | ICD-10-CM | POA: Diagnosis not present

## 2020-07-07 MED ORDER — METOPROLOL TARTRATE 25 MG PO TABS: 25.0000 mg | ORAL_TABLET | Freq: Two times a day (BID) | ORAL | 3 refills | Status: DC

## 2020-07-07 MED ORDER — DOXYCYCLINE HYCLATE 100 MG PO TABS: 100.0000 mg | ORAL_TABLET | Freq: Two times a day (BID) | ORAL | 0 refills | Status: DC

## 2020-07-07 MED ORDER — HYDROCODONE-ACETAMINOPHEN 7.5-325 MG PO TABS: 1.0000 | ORAL_TABLET | Freq: Four times a day (QID) | ORAL | 0 refills | Status: DC | PRN

## 2020-07-07 NOTE — Progress Notes (Signed)
Established patient visit   Patient: Jeanne [name]   DOB: [birth year]   69 y.o. Female  MRN: [id number] Visit Date: 07/07/2020  Today's healthcare provider: [name], PA   No chief complaint on file.  Subjective    HPI   Patient is a 69 year old female who presents for follow up of chronic pain.  She is due for her medication refill.  Patient has been on pain medication for years for her rheumatoid arthritis.   Patient takes antiinflammatory medication daily and states she only takes the narcotic medication as needed.  She states it is not every day and she only uses a half of a pill when she does.  However she states she does need it refilled today. Past Medical History:  Diagnosis Date   Arthritis    RA   Cervical cancer ([place])    Complication of anesthesia    History of kidney stones    Hypertension    PONV (postoperative nausea and vomiting)    Past Surgical History:  Procedure Laterality Date   ABDOMINAL HYSTERECTOMY  09/10/2003   CYSTOGRAM N/A 08/04/2018   Procedure: CYSTOGRAM WITH URETHAL DILATION;  Surgeon: Hollice Espy, MD;  Location: [facility] ORS;  Service: Urology;  Laterality: N/A;   CYSTOSCOPY W/ RETROGRADES Bilateral 08/04/2018   Procedure: CYSTOSCOPY WITH RETROGRADE PYELOGRAM;  Surgeon: Hollice Espy, MD;  Location: [facility] OR[facility];  Service: Urology;  Laterality: Bilateral;   CYSTOSCOPY/URETEROSCOPY/HOLMIUM LASER/STENT PLACEMENT Left 08/04/2018   Procedure: CYSTOSCOPY/URETEROSCOPY/HOLMIUM LASER/STENT PLACEMENT;  Surgeon: Hollice Espy, MD;  Location: [facility] ORS;  Service: Urology;  Laterality: Left;   JOINT REPLACEMENT     TOTAL HIP ARTHROPLASTY Right 1972   TOTAL HIP ARTHROPLASTY Left 1981   TOTAL KNEE ARTHROPLASTY Right 1990   Social History   Tobacco Use   Smoking status: Former Smoker    Years: 20.00    Quit date: 09/09/1989    Years since quitting: 30.8   Smokeless tobacco: Never Used   Tobacco comment: QUIT IN 1990  Vaping Use   Vaping  Use: Never used  Substance Use Topics   Alcohol use: No    Alcohol/week: 0.0 standard drinks   Drug use: No   Family Status  Relation Name Status   Mother  Deceased at age 71   Father  Deceased at age 40   Sister  Alive   Brother  Alive   Neg Hx  (Not Specified)   Allergies  Allergen Reactions   Codeine Nausea Only and Nausea And Vomiting   Augmentin [Amoxicillin-Pot Clavulanate] Nausea Only    Very nauseated/ feels sore on body    Sulfa Antibiotics Rash    Mouth ulcers   Medications: Outpatient Medications Prior to Visit  Medication Sig   Aspirin-Caffeine (BC FAST PAIN RELIEF PO) Take 1 Package by mouth daily as needed (headache).  (Patient not taking: Reported on 07/07/2020)   cefUROXime (CEFTIN) 250 MG tablet Take 1 tablet (250 mg total) by mouth 2 (two) times daily with a meal. (Patient not taking: Reported on 07/07/2020)   CRANBERRY PO Take by mouth. (Patient not taking: Reported on 07/07/2020)   diclofenac (VOLTAREN) 75 MG EC tablet TAKE 1 TABLET BY MOUTH TWICE A DAY   docusate sodium (COLACE) 100 MG capsule Take 1 capsule (100 mg total) by mouth 2 (two) times daily. (Patient not taking: Reported on 07/20/2019)   doxycycline (VIBRA-TABS) 100 MG tablet Take 1 tablet (100 mg total) by mouth 2 (two) times daily. (Patient not  taking: Reported on 07/07/2020)   HYDROcodone-acetaminophen (NORCO) 7.5-325 MG tablet Take 1 tablet by mouth every 6 (six) hours as needed for moderate pain (Take lowest effective dose,not recomended for long term use.). Reviewed controlled substance record in PMP Aware.   lactobacillus acidophilus (BACID) TABS tablet Take 1 tablet by mouth daily.   lidocaine-prilocaine (EMLA) cream APPLY SPARINGLY 2-3 TIMES A DAY TO AREA OF RADIATION THERAPY IRRITATION.   metoprolol tartrate (LOPRESSOR) 25 MG tablet TAKE 1 TABLET BY MOUTH TWICE A DAY (Patient not taking: Reported on 07/07/2020)   mirabegron ER (MYRBETRIQ) 25 MG TB24 tablet Take 1 tablet (25 mg total) by  mouth daily. (Patient not taking: Reported on 07/20/2019)   nystatin cream (MYCOSTATIN) Apply 1 application topically 2 (two) times daily. To area as discussed. (Patient not taking: Reported on 07/07/2020)   Probiotic Product (PROBIOTIC PO) Take by mouth daily.    promethazine (PHENERGAN) 25 MG tablet TAKE 0.5 TABLETS (12.5 MG TOTAL) BY MOUTH EVERY 6 (SIX) HOURS AS NEEDED FOR NAUSEA OR VOMITING.   senna (SENOKOT) 8.6 MG tablet Take by mouth. (Patient not taking: Reported on 07/07/2020)   No facility-administered medications prior to visit.    Review of Systems  Respiratory: Negative for shortness of breath.   Cardiovascular: Negative for chest pain, palpitations and leg swelling.  Neurological: Negative for dizziness and headaches.      Objective    BP 136/63 (BP Location: Right Arm, Patient Position: Sitting, Cuff Size: Normal)   Pulse 69   Temp 98.4 F (36.9 C) (Oral)   SpO2 99%  BP Readings from Last 3 Encounters:  07/07/20 136/63  07/20/19 132/86  07/20/19 132/86   Wt Readings from Last 3 Encounters:  07/20/19 146 lb (66.2 kg)  07/20/19 146 lb 9.6 oz (66.5 kg)  04/26/19 141 lb (64 kg)   Physical Exam Constitutional:      General: She is not in acute distress.    Appearance: She is well-developed.  HENT:     Head: Normocephalic and atraumatic.     Right Ear: Hearing normal.     Left Ear: Hearing normal.     Nose: Nose normal.  Eyes:     General: Lids are normal. No scleral icterus.       Right eye: No discharge.        Left eye: No discharge.     Conjunctiva/sclera: Conjunctivae normal.  Cardiovascular:     Rate and Rhythm: Normal rate and regular rhythm.     Heart sounds: Normal heart sounds.  Pulmonary:     Effort: Pulmonary effort is normal. No respiratory distress.     Breath sounds: Normal breath sounds.  Abdominal:     General: Bowel sounds are normal.  Genitourinary:    Comments: Hysterectomy in 2004 due to cervical cancer. Scarring from chemotherapy  and radiation treatment does not allow vaginal exam. Musculoskeletal:     Comments:  Gait and Station - Slow stiff tottering gait.  Spine, Ribs and Pelvis - Some discomfort in lumbar spine. Mild tenderness without deformities. Very stiff and limited ROM.  Right Upper Extremity - Ulnar deviation of fingers at MCP. Very limited ability to grip. Unable to bend finger joints. Stiffness of shoulders and elbows.  Left Upper Extremity - Ulnar deviation of MCP joints with straight stiff fingers. Unable to flex into a fist and tender to palpate. Stiffness of shoulders and elbows. Wrists stiff and slightly enlarged.  Right Lower Extremity - Hip very stiff with history of joint  replacement and knee replacement. Tender to palpate.  Left Lower Extremity - Decrease in hip ROM. Pain with crepitus of the left knee. Decrease ROM (flex only to 80 degrees and extend to 170 degrees).Upper Extremity - Nodules on elbows. Ulnar deviation of fingers - both hands - with large MCP joints. Extremely diminished ability to flex or extend fingers. Stiffness and tenderness of both ankles. Normal symmetric pulses.    Skin:    Findings: No lesion or rash.  Neurological:     Mental Status: She is alert and oriented to person, place, and time.  Psychiatric:        Speech: Speech normal.        Behavior: Behavior normal.        Thought Content: Thought content normal.      No results found for any visits on 07/07/20.  Assessment & Plan     1. Rheumatoid arthritis involving multiple sites with positive rheumatoid factor (HCC) Pain in multiple joints. Norco 7.5-325 mg up to q 6 hrs prn helps NSAID maintain control of chronic pain and allow easier ambulation. Recheck labs and refilled Norco. - CBC with Differential/Platelet - Comprehensive metabolic panel  2. Chronic cystitis Frequent recurrence of UTI's since radiation treatment to pelvis for uterine cancer causing radiation cystitis in 2004. Having some foul odor to urine and  requests refill of the Doxycyline and will recheck labs. Should follow up with urologist, also. - CBC with Differential/Platelet - Comprehensive metabolic panel  3. Essential hypertension Good control of BP. States she needs a refill of the Metoprolol 25 mg BID. Recheck CBC and CMP. - CBC with Differential/Platelet - Comprehensive metabolic panel   No follow-ups on file.      Andres Shad, PA, have reviewed all documentation for this visit. The documentation on 07/07/20 for the exam, diagnosis, procedures, and orders are all accurate and complete.    [name], [place] [phone] (phone) [phone] (fax)  [place]

## 2020-07-09 ENCOUNTER — Other Ambulatory Visit: Payer: Self-pay | Admitting: Physician Assistant

## 2020-07-09 NOTE — Telephone Encounter (Signed)
Requested medication (s) are due for refill today: yes  Requested medication (s) are on the active medication list: yes  Last refill:  05/10/20  Future visit scheduled: no  Notes to clinic:  med not delegated to NT to RF   Requested Prescriptions  Pending Prescriptions Disp Refills   promethazine (PHENERGAN) 25 MG tablet [Pharmacy Med Name: PROMETHAZINE 25 MG TABLET] 30 tablet 0    Sig: TAKE 0.5 TABLETS (12.5 MG TOTAL) BY MOUTH EVERY 6 (SIX) HOURS AS NEEDED FOR NAUSEA OR VOMITING.      Not Delegated - Gastroenterology: Antiemetics Failed - 07/09/2020  5:30 PM      Failed - This refill cannot be delegated      Passed - Valid encounter within last 6 months    Recent Outpatient Visits           2 days ago Chronic cystitis   [place], [place]   9 months ago Symptoms involving urinary system   [place], FNP   9 months ago Erroneous encounter - disregard   [place], FNP   10 months ago Herpes zoster without complication   [facility], Kelby Aline, FNP   11 months ago Annual physical exam   [facility], Vickki [place]uff, [place]

## 2020-07-14 ENCOUNTER — Encounter: Payer: Self-pay | Admitting: Family Medicine

## 2020-07-14 NOTE — Telephone Encounter (Signed)
This patient's husband will bring urine specimen for urinalysis and C&S if signs of infection.

## 2020-07-15 ENCOUNTER — Other Ambulatory Visit: Payer: Self-pay | Admitting: Family Medicine

## 2020-07-15 ENCOUNTER — Other Ambulatory Visit (INDEPENDENT_AMBULATORY_CARE_PROVIDER_SITE_OTHER): Payer: Medicare HMO

## 2020-07-15 DIAGNOSIS — N3040 Irradiation cystitis without hematuria: Secondary | ICD-10-CM | POA: Diagnosis not present

## 2020-07-15 DIAGNOSIS — N390 Urinary tract infection, site not specified: Secondary | ICD-10-CM

## 2020-07-15 DIAGNOSIS — N3020 Other chronic cystitis without hematuria: Secondary | ICD-10-CM

## 2020-07-15 LAB — POCT URINALYSIS DIPSTICK
Bilirubin, UA: NEGATIVE
Glucose, UA: NEGATIVE
Ketones, UA: NEGATIVE
Nitrite, UA: POSITIVE
Protein, UA: POSITIVE — AB
Spec Grav, UA: 1.02 (ref 1.010–1.025)
Urobilinogen, UA: 0.2 E.U./dL
pH, UA: 6.5 (ref 5.0–8.0)

## 2020-07-15 MED ORDER — DOXYCYCLINE HYCLATE 100 MG PO TABS: 100.0000 mg | ORAL_TABLET | Freq: Two times a day (BID) | ORAL | 0 refills | Status: DC

## 2020-07-20 LAB — URINE CULTURE

## 2020-07-21 ENCOUNTER — Other Ambulatory Visit: Payer: Self-pay

## 2020-07-21 ENCOUNTER — Encounter: Payer: Self-pay | Admitting: Family Medicine

## 2020-07-21 ENCOUNTER — Ambulatory Visit (INDEPENDENT_AMBULATORY_CARE_PROVIDER_SITE_OTHER): Payer: Medicare HMO | Admitting: Family Medicine

## 2020-07-21 VITALS — BP 132/79 | HR 73 | Temp 98.8°F | Resp 18

## 2020-07-21 DIAGNOSIS — Z87442 Personal history of urinary calculi: Secondary | ICD-10-CM

## 2020-07-21 DIAGNOSIS — N390 Urinary tract infection, site not specified: Secondary | ICD-10-CM | POA: Diagnosis not present

## 2020-07-21 MED ORDER — SULFAMETHOXAZOLE-TRIMETHOPRIM 800-160 MG PO TABS: 1.0000 | ORAL_TABLET | Freq: Two times a day (BID) | ORAL | 0 refills | Status: DC

## 2020-07-21 NOTE — Progress Notes (Signed)
Established patient visit   Patient: Jeanne [name]   DOB: 03-May-1951   69 y.o. Female  MRN: [id number] Visit Date: 07/21/2020  Today's healthcare provider: [name], PA   Chief Complaint  Patient presents with  . Urinary Tract Infection   Subjective    Urinary Tract Infection  This is a recurrent problem. Pertinent negatives include no chills, nausea or vomiting.  Patient has taken one dose of Doxycycline.   Urine culture was obtained on 07/15/2020 and showed positive for E.Coli bacteria. Patient is here for a Rocephin injection (ceftriaxone) of 500 mg IM. She was advised to increase fluid intake to flush out urinary tract and recheck urine in 5 days.   Past Medical History:  Diagnosis Date  . Arthritis    RA  . Cervical cancer ([place])   . Complication of anesthesia   . History of kidney stones   . Hypertension   . PONV (postoperative nausea and vomiting)    Past Surgical History:  Procedure Laterality Date  . ABDOMINAL HYSTERECTOMY  09/10/2003  . CYSTOGRAM N/A 08/04/2018   Procedure: CYSTOGRAM WITH URETHAL DILATION;  Surgeon: Hollice Espy, MD;  Location: [facility] OR[facility];  Service: Urology;  Laterality: N/A;  . CYSTOSCOPY W/ RETROGRADES Bilateral 08/04/2018   Procedure: CYSTOSCOPY WITH RETROGRADE PYELOGRAM;  Surgeon: Hollice Espy, MD;  Location: [facility] OR[facility];  Service: Urology;  Laterality: Bilateral;  . CYSTOSCOPY/URETEROSCOPY/HOLMIUM LASER/STENT PLACEMENT Left 08/04/2018   Procedure: CYSTOSCOPY/URETEROSCOPY/HOLMIUM LASER/STENT PLACEMENT;  Surgeon: Hollice Espy, MD;  Location: [facility] OR[facility];  Service: Urology;  Laterality: Left;  . JOINT REPLACEMENT    . TOTAL HIP ARTHROPLASTY Right 1972  . TOTAL HIP ARTHROPLASTY Left 1981  . TOTAL KNEE ARTHROPLASTY Right 1990   Social History   Tobacco Use  . Smoking status: Former Smoker    Years: 20.00    Quit date: 09/09/1989    Years since quitting: 30.8  . Smokeless tobacco: Never Used  . Tobacco comment: QUIT IN 1990    Vaping Use  . Vaping Use: Never used  Substance Use Topics  . Alcohol use: No    Alcohol/week: 0.0 standard drinks  . Drug use: No   Family History  Problem Relation Age of Onset  . Breast cancer Mother   . Hypertension Father   . Cancer Father   . Prostate cancer Neg Hx   . Kidney cancer Neg Hx   . Bladder Cancer Neg Hx    Allergies  Allergen Reactions  . Codeine Nausea Only and Nausea And Vomiting  . Augmentin [Amoxicillin-Pot Clavulanate] Nausea Only    Very nauseated/ feels sore on body        Medications: Outpatient Medications Prior to Visit  Medication Sig  . diclofenac (VOLTAREN) 75 MG EC tablet TAKE 1 TABLET BY MOUTH TWICE A DAY  . doxycycline (VIBRA-TABS) 100 MG tablet Take 1 tablet (100 mg total) by mouth 2 (two) times daily.  Mar[place] Kitchen HYDROcodone-acetaminophen (NORCO) 7.5-325 MG tablet Take 1 tablet by mouth every 6 (six) hours as needed for moderate pain (Take lowest effective dose,not recomended for long term use.). Reviewed controlled substance record in PMP Aware.  . lactobacillus acidophilus (BACID) TABS tablet Take 1 tablet by mouth daily.  Marland Kitchen lidocaine-prilocaine (EMLA) cream APPLY SPARINGLY 2-3 TIMES A DAY TO AREA OF RADIATION THERAPY IRRITATION.  . metoprolol tartrate (LOPRESSOR) 25 MG tablet Take 1 tablet (25 mg total) by mouth 2 (two) times daily.  . Probiotic Product (PROBIOTIC PO) Take by mouth daily.   Marland Kitchen  promethazine (PHENERGAN) 25 MG tablet TAKE 0.5 TABLETS (12.5 MG TOTAL) BY MOUTH EVERY 6 (SIX) HOURS AS NEEDED FOR NAUSEA OR VOMITING.   No facility-administered medications prior to visit.    Review of Systems  Constitutional: Negative for appetite change, chills, fatigue and fever.  Respiratory: Negative for chest tightness and shortness of breath.   Cardiovascular: Negative for chest pain and palpitations.  Gastrointestinal: Negative for abdominal pain, nausea and vomiting.  Genitourinary: Positive for dysuria.  Neurological: Negative for dizziness  and weakness.      Objective    BP 132/79 (BP Location: Left Arm, Patient Position: Sitting, Cuff Size: Normal)   Pulse 73   Temp 98.8 F (37.1 C) (Oral)   Resp 18    Physical Exam Constitutional:      General: She is not in acute distress.    Appearance: She is well-developed.  HENT:     Head: Normocephalic and atraumatic.     Right Ear: Hearing normal.     Left Ear: Hearing normal.     Nose: Nose normal.  Eyes:     General: Lids are normal. No scleral icterus.       Right eye: No discharge.        Left eye: No discharge.     Conjunctiva/sclera: Conjunctivae normal.  Cardiovascular:     Rate and Rhythm: Normal rate and regular rhythm.     Heart sounds: Normal heart sounds.  Pulmonary:     Effort: Pulmonary effort is normal. No respiratory distress.     Breath sounds: Normal breath sounds.  Abdominal:     General: Bowel sounds are normal.     Palpations: Abdomen is soft.     Tenderness: There is no abdominal tenderness. There is no right CVA tenderness or left CVA tenderness.  Skin:    Findings: No lesion or rash.  Neurological:     Mental Status: She is alert and oriented to person, place, and time.  Psychiatric:        Speech: Speech normal.        Behavior: Behavior normal.        Thought Content: Thought content normal.     No results found for any visits on 07/21/20.  Assessment & Plan    1. Recurrent UTI Had recurrence of dysuria and "bad urine odor" on 07-14-20. Urine C&S reported E.coli that was resistant to most oral antibiotics except Bactrim. She had previously reported it caused an allergy reaction but today states it was only a dry mouth sensation instead of a rash. She did not want to take a Rocephin injection and preferred to give the Bactrim another try. Stated dysuria has been a chronic problem since radiation treatment for uterine cancer with hysterectomy in 2004. Reminded her of possible allergy reaction symptoms to watch for and be ready to seek  medical attention quickly if any occur. Discontinue Doxycycline (C&S on 07-07-20 showed sensitivity of E.coli at that time). - sulfamethoxazole-trimethoprim (BACTRIM DS) 800-160 MG tablet; Take 1 tablet by mouth 2 (two) times daily.  Dispense: 14 tablet; Refill: 0  2. History of kidney stones Passed a large stone without pain at onset of this episode of urinary symptoms. No pain or hematuria with passage. Keep appointment with [facility] urologist on 08-25-20.  No follow-ups on file.      Andres Shad, PA, have reviewed all documentation for this visit. The documentation on 07/21/20 for the exam, diagnosis, procedures, and orders are all accurate and complete.  [name], [place] [phone] (phone) [phone] (fax)  [place]

## 2020-07-25 ENCOUNTER — Encounter: Payer: Self-pay | Admitting: Family Medicine

## 2020-07-29 ENCOUNTER — Other Ambulatory Visit: Payer: Self-pay | Admitting: Family Medicine

## 2020-07-29 ENCOUNTER — Encounter: Payer: Self-pay | Admitting: Family Medicine

## 2020-07-29 DIAGNOSIS — M0579 Rheumatoid arthritis with rheumatoid factor of multiple sites without organ or systems involvement: Secondary | ICD-10-CM

## 2020-07-29 MED ORDER — HYDROCODONE-ACETAMINOPHEN 7.5-325 MG PO TABS: 1.0000 | ORAL_TABLET | Freq: Four times a day (QID) | ORAL | 0 refills | Status: DC | PRN

## 2020-08-08 ENCOUNTER — Encounter: Payer: Self-pay | Admitting: Family Medicine

## 2020-08-09 ENCOUNTER — Other Ambulatory Visit: Payer: Self-pay

## 2020-08-09 DIAGNOSIS — N3040 Irradiation cystitis without hematuria: Secondary | ICD-10-CM

## 2020-08-09 NOTE — Telephone Encounter (Signed)
Husband will bring in a specimen for urinalysis and C&S if needed.

## 2020-08-09 NOTE — Progress Notes (Addendum)
Subjective:   Jeanne [name] is a 69 y.o. female who presents for Medicare Annual (Subsequent) preventive examination.  I connected with Jeanne [name] today by telephone and verified that I am speaking with the correct person using two identifiers. Location patient: home Location provider: work Persons participating in the virtual visit: patient, provider.   I discussed the limitations, risks, security and privacy concerns of performing an evaluation and management service by telephone and the availability of in person appointments. I also discussed with the patient that there may be a patient responsible charge related to this service. The patient expressed understanding and verbally consented to this telephonic visit.    Interactive audio and video telecommunications were attempted between this provider and patient, however failed, due to patient having technical difficulties OR patient did not have access to video capability.  We continued and completed visit with audio only.   Review of Systems    N/A  Cardiac Risk Factors include: advanced age (>64men, >9 women);hypertension     Objective:    There were no vitals filed for this visit. There is no height or weight on file to calculate BMI.  Advanced Directives 08/10/2020 07/20/2019 08/04/2018 07/17/2018 06/07/2016 03/01/2015  Does Patient Have a Medical Advance Directive? No No Yes Yes No No  Type of Advance Directive - - Living will;Healthcare Power of [place];Living will - -  Does patient want to make changes to medical advance directive? - - No - Patient declined - - -  Copy of [place] in Chart? - - No - copy requested No - copy requested - -  Would patient like information on creating a medical advance directive? No - Patient declined Yes (MAU/Ambulatory/Procedural Areas - Information given) - - - -    Current Medications (verified) Outpatient Encounter Medications as of  08/10/2020  Medication Sig   diclofenac (VOLTAREN) 75 MG EC tablet TAKE 1 TABLET BY MOUTH TWICE A DAY   HYDROcodone-acetaminophen (NORCO) 7.5-325 MG tablet Take 1 tablet by mouth every 6 (six) hours as needed for moderate pain (Take lowest effective dose,not recomended for long term use.). Reviewed controlled substance record in PMP Aware.   lactobacillus acidophilus (BACID) TABS tablet Take 1 tablet by mouth daily.   lidocaine-prilocaine (EMLA) cream APPLY SPARINGLY 2-3 TIMES A DAY TO AREA OF RADIATION THERAPY IRRITATION.   metoprolol tartrate (LOPRESSOR) 25 MG tablet Take 1 tablet (25 mg total) by mouth 2 (two) times daily.   promethazine (PHENERGAN) 25 MG tablet TAKE 0.5 TABLETS (12.5 MG TOTAL) BY MOUTH EVERY 6 (SIX) HOURS AS NEEDED FOR NAUSEA OR VOMITING.   Probiotic Product (PROBIOTIC PO) Take by mouth daily.  (Patient not taking: Reported on 08/10/2020)   sulfamethoxazole-trimethoprim (BACTRIM DS) 800-160 MG tablet Take 1 tablet by mouth 2 (two) times daily. (Patient not taking: Reported on 08/10/2020)   No facility-administered encounter medications on file as of 08/10/2020.    Allergies (verified) Codeine and Augmentin [amoxicillin-pot clavulanate]   History: Past Medical History:  Diagnosis Date   Arthritis    RA   Cervical cancer ([place])    Complication of anesthesia    History of kidney stones    Hypertension    PONV (postoperative nausea and vomiting)    Past Surgical History:  Procedure Laterality Date   ABDOMINAL HYSTERECTOMY  09/10/2003   CYSTOGRAM N/A 08/04/2018   Procedure: CYSTOGRAM WITH URETHAL DILATION;  Surgeon: Hollice Espy, MD;  Location: [facility] OR[facility];  Service: Urology;  Laterality:  N/A;   CYSTOSCOPY W/ RETROGRADES Bilateral 08/04/2018   Procedure: CYSTOSCOPY WITH RETROGRADE PYELOGRAM;  Surgeon: Hollice Espy, MD;  Location: [facility];  Service: Urology;  Laterality: Bilateral;   CYSTOSCOPY/URETEROSCOPY/HOLMIUM LASER/STENT PLACEMENT Left 08/04/2018   Procedure:  CYSTOSCOPY/URETEROSCOPY/HOLMIUM LASER/STENT PLACEMENT;  Surgeon: Hollice Espy, MD;  Location: [facility];  Service: Urology;  Laterality: Left;   JOINT REPLACEMENT     TOTAL HIP ARTHROPLASTY Right 1972   TOTAL HIP ARTHROPLASTY Left 1981   TOTAL KNEE ARTHROPLASTY Right 1990   Family History  Problem Relation Age of Onset   Breast cancer Mother    Hypertension Father    Cancer Father    Prostate cancer Neg Hx    Kidney cancer Neg Hx    Bladder Cancer Neg Hx    Social History   Socioeconomic History   Marital status: Married    Spouse name: kim   Number of children: 0   Years of education: Not on file   Highest education level: Some college, no degree  Occupational History   Occupation: disability    Comment: now on SS  Tobacco Use   Smoking status: Former Smoker    Years: 20.00    Quit date: 09/09/1989    Years since quitting: 30.9   Smokeless tobacco: Never Used   Tobacco comment: QUIT IN 1990  Vaping Use   Vaping Use: Never used  Substance and Sexual Activity   Alcohol use: No    Alcohol/week: 0.0 standard drinks   Drug use: No   Sexual activity: Not on file  Other Topics Concern   Not on file  Social History Narrative   Has 1 adopted daughter    Social Determinants of Health   Financial Resource Strain: Low Risk    Difficulty of Paying Living Expenses: Not hard at all  Food Insecurity: No Food Insecurity   Worried About Charity fundraiser in the Last Year: Never true   [place] in the Last Year: Never true  Transportation Needs: No Transportation Needs   Lack of Transportation (Medical): No   Lack of Transportation (Non-Medical): No  Physical Activity: Inactive   Days of Exercise per Week: 0 days   Minutes of Exercise per Session: 0 min  Stress: No Stress Concern Present   Feeling of Stress : Only a little  Social Connections: Moderately Integrated   Frequency of Communication with Friends and Family: More than three times a week   Frequency of  Social Gatherings with Friends and Family: More than three times a week   Attends Religious Services: More than 4 times per year   Active Member of [employer] or Organizations: No   Attends Music therapist: Never   Marital Status: Married    Tobacco Counseling Counseling given: Not Answered Comment: QUIT IN 1990   Clinical Intake:  Pre-visit preparation completed: Yes  Pain : No/denies pain (Has chronic arthritis pains.)     Nutritional Risks: None Diabetes: No  How often do you need to have someone help you when you read instructions, pamphlets, or other written materials from your doctor or pharmacy?: 1 - Never  Diabetic? No  Interpreter Needed?: No  Information entered by :: [facility], LPN   Activities of Daily Living In your present state of health, do you have any difficulty performing the following activities: 08/10/2020  Hearing? N  Vision? N  Difficulty concentrating or making decisions? N  Walking or climbing stairs? Y  Comment Due to arthritis pain  in knees.  Dressing or bathing? N  Doing errands, shopping? N  Preparing Food and eating ? N  Using the Toilet? N  In the past six months, have you accidently leaked urine? Y  Comment Due to chronic UTIs  Do you have problems with loss of bowel control? N  Managing your Medications? N  Managing your Finances? N  Housekeeping or managing your Housekeeping? N  Some recent data might be hidden    Patient Care Team: Chrismon, Vickki Muff, PA as PCP - General (Physician Assistant) Anell Barr, OD (Optometry)  Indicate any recent Medical Services you may have received from other than [facility] providers in the past year (date may be approximate).     Assessment:   This is a routine wellness examination for Jeanne [name].  Hearing/Vision screen No exam data present  Dietary issues and exercise activities discussed: Current Exercise Habits: The patient does not participate in regular exercise at present,  Exercise limited by: orthopedic condition(s)  Goals      DIET - INCREASE WATER INTAKE     Recommend to drink at least 6-8 8oz glasses of water per day.         Depression Screen PHQ 2/9 Scores 08/10/2020 07/20/2019 07/17/2018 07/17/2018 07/17/2018 04/23/2017  PHQ - 2 Score 0 0 0 0 0 1  PHQ- 9 Score - - - 1 - 4    Fall Risk Fall Risk  08/10/2020 07/20/2019 07/20/2019 07/17/2018 07/17/2018  Falls in the past year? 0 0 0 0 0  Number falls in past yr: 0 0 0 - -  Injury with Fall? 0 0 0 - -    Any stairs in or around the home? Yes  If so, are there any without handrails? No  Home free of loose throw rugs in walkways, pet beds, electrical cords, etc? Yes  Adequate lighting in your home to reduce risk of falls? Yes   ASSISTIVE DEVICES UTILIZED TO PREVENT FALLS:  Life alert? No  Use of a cane, walker or w/c? Yes Grab bars in the bathroom? No  Shower chair or bench in shower? Yes  Elevated toilet seat or a handicapped toilet? Yes    Cognitive Function:        Immunizations  There is no immunization history on file for this patient.  TDAP status: Due, Education has been provided regarding the importance of this vaccine. Advised may receive this vaccine at local pharmacy or Health Dept. Aware to provide a copy of the vaccination record if obtained from local pharmacy or Health Dept. Verbalized acceptance and understanding. Flu Vaccine status: Declined, Education has been provided regarding the importance of this vaccine but patient still declined. Advised may receive this vaccine at local pharmacy or Health Dept. Aware to provide a copy of the vaccination record if obtained from local pharmacy or Health Dept. Verbalized acceptance and understanding. Pneumococcal vaccine status: Declined,  Education has been provided regarding the importance of this vaccine but patient still declined. Advised may receive this vaccine at local pharmacy or Health Dept. Aware to provide a copy of the  vaccination record if obtained from local pharmacy or Health Dept. Verbalized acceptance and understanding.  Covid-19 vaccine status: Declined, Education has been provided regarding the importance of this vaccine but patient still declined. Advised may receive this vaccine at local pharmacy or Health Dept.or vaccine clinic. Aware to provide a copy of the vaccination record if obtained from local pharmacy or Health Dept. Verbalized acceptance and understanding.  Qualifies for  Shingles Vaccine? Yes   Zostavax completed No   Shingrix Completed?: No.    Education has been provided regarding the importance of this vaccine. Patient has been advised to call insurance company to determine out of pocket expense if they have not yet received this vaccine. Advised may also receive vaccine at local pharmacy or Health Dept. Verbalized acceptance and understanding.  Screening Tests Health Maintenance  Topic Date Due   MAMMOGRAM  Never done   COLONOSCOPY  08/05/2009   COVID-19 Vaccine (1) 08/26/2020 (Originally 12/25/1962)   TETANUS/TDAP  08/10/2021 (Originally 12/26/1969)   PNA vac Low Risk Adult (1 of 2 - PCV13) 08/10/2021 (Originally 12/25/2017)   INFLUENZA VACCINE  07/17/2024 (Originally 04/10/2020)   Hepatitis C Screening  Completed    Health Maintenance  Health Maintenance Due  Topic Date Due   MAMMOGRAM  Never done   COLONOSCOPY  08/05/2009    Colorectal cancer screening: Currently due. Declined a colonoscopy referral at this time but would like to complete the cologuard order.  Mammogram status: Currently due, declined order at this time. Bone Density status: Currently due, declined order at this time.   Lung Cancer Screening: (Low Dose CT Chest recommended if Age 76-80 years, 30 pack-year currently smoking OR have quit w/in 15years.) does not qualify.    Additional Screening:  Hepatitis C Screening: Up to date  Vision Screening: Recommended annual ophthalmology exams for early detection of  glaucoma and other disorders of the eye. Is the patient up to date with their annual eye exam?  Yes  Who is the provider or what is the name of the office in which the patient attends annual eye exams? Dr Ellin Mayhew If pt is not established with a provider, would they like to be referred to a provider to establish care? No .   Dental Screening: Recommended annual dental exams for proper oral hygiene  Community Resource Referral / Chronic Care Management: CRR required this visit?  No   CCM required this visit?  No      Plan:     I have personally reviewed and noted the following in the patient's chart:   Medical and social history Use of alcohol, tobacco or illicit drugs  Current medications and supplements Functional ability and status Nutritional status Physical activity Advanced directives List of other physicians Hospitalizations, surgeries, and ER visits in previous 12 months Vitals Screenings to include cognitive, depression, and falls Referrals and appointments  In addition, I have reviewed and discussed with patient certain preventive protocols, quality metrics, and best practice recommendations. A written personalized care plan for preventive services as well as general preventive health recommendations were provided to patient.     Jeanne [name] [place], [place]   [id number]   Nurse Notes: Pt declined a DEXA scan order, mammogram order, colonoscopy referral, flu shot, pneumonia vaccine or future Covid vaccine.    Reviewed note and plan of Nurse Health Advisor. Agree with documentation and recommendations.

## 2020-08-10 ENCOUNTER — Other Ambulatory Visit: Payer: Self-pay

## 2020-08-10 ENCOUNTER — Ambulatory Visit (INDEPENDENT_AMBULATORY_CARE_PROVIDER_SITE_OTHER): Payer: Medicare HMO

## 2020-08-10 DIAGNOSIS — Z0000 Encounter for general adult medical examination without abnormal findings: Secondary | ICD-10-CM

## 2020-08-10 NOTE — Patient Instructions (Signed)
Ms. Jeanne [name] , Thank you for taking time to come for your Medicare Wellness Visit. I appreciate your ongoing commitment to your health goals. Please review the following plan we discussed and let me know if I can assist you in the future.   Screening recommendations/referrals: Colonoscopy: Currently due, cologuard ordered today.  Mammogram: Currently due, declined a mammogram order at this time.  Bone Density: Currently due, declined an order at this time.  Recommended yearly ophthalmology/optometry visit for glaucoma screening and checkup Recommended yearly dental visit for hygiene and checkup  Vaccinations: Influenza vaccine: Currently due, declined at this time.  Pneumococcal vaccine: Currently due, declined at this time.  Tdap vaccine: Currently due, declined at this time.  Shingles vaccine: Shingrix discussed. Please contact your pharmacy for coverage information.     Advanced directives: Advance directive discussed with you today. I have provided a copy for you to complete at home and have notarized. Once this is complete please bring a copy in to our office so we can scan it into your chart.  Conditions/risks identified: Continue to increase water intake to 6-8 8 oz glasses a day.   Next appointment: 08/16/20 @ 1:20 PM for an AWV for 2022. Declined scheduling an apt with PCP at this time.  Preventive Care 3 Years and Older, Female Preventive care refers to lifestyle choices and visits with your health care provider that can promote health and wellness. What does preventive care include?  A yearly physical exam. This is also called an annual well check.  Dental exams once or twice a year.  Routine eye exams. Ask your health care provider how often you should have your eyes checked.  Personal lifestyle choices, including:  Daily care of your teeth and gums.  Regular physical activity.  Eating a healthy diet.  Avoiding tobacco and drug use.  Limiting alcohol  use.  Practicing safe sex.  Taking low-dose aspirin every day.  Taking vitamin and mineral supplements as recommended by your health care provider. What happens during an annual well check? The services and screenings done by your health care provider during your annual well check will depend on your age, overall health, lifestyle risk factors, and family history of disease. Counseling  Your health care provider may ask you questions about your:  Alcohol use.  Tobacco use.  Drug use.  Emotional well-being.  Home and relationship well-being.  Sexual activity.  Eating habits.  History of falls.  Memory and ability to understand (cognition).  Work and work Statistician.  Reproductive health. Screening  You may have the following tests or measurements:  Height, weight, and BMI.  Blood pressure.  Lipid and cholesterol levels. These may be checked every 5 years, or more frequently if you are over 77 years old.  Skin check.  Lung cancer screening. You may have this screening every year starting at age 73 if you have a 30-pack-year history of smoking and currently smoke or have quit within the past 15 years.  Fecal occult blood test (FOBT) of the stool. You may have this test every year starting at age 54.  Flexible sigmoidoscopy or colonoscopy. You may have a sigmoidoscopy every 5 years or a colonoscopy every 10 years starting at age 16.  Hepatitis C blood test.  Hepatitis B blood test.  Sexually transmitted disease (STD) testing.  Diabetes screening. This is done by checking your blood sugar (glucose) after you have not eaten for a while (fasting). You may have this done every 1-3 years.  Bone  density scan. This is done to screen for osteoporosis. You may have this done starting at age 22.  Mammogram. This may be done every 1-2 years. Talk to your health care provider about how often you should have regular mammograms. Talk with your health care provider about  your test results, treatment options, and if necessary, the need for more tests. Vaccines  Your health care provider may recommend certain vaccines, such as:  Influenza vaccine. This is recommended every year.  Tetanus, diphtheria, and acellular pertussis (Tdap, Td) vaccine. You may need a Td booster every 10 years.  Zoster vaccine. You may need this after age 69.  Pneumococcal 13-valent conjugate (PCV13) vaccine. One dose is recommended after age 51.  Pneumococcal polysaccharide (PPSV23) vaccine. One dose is recommended after age 55. Talk to your health care provider about which screenings and vaccines you need and how often you need them. This information is not intended to replace advice given to you by your health care provider. Make sure you discuss any questions you have with your health care provider. Document Released: 09/23/2015 Document Revised: 05/16/2016 Document Reviewed: 06/28/2015 Elsevier Interactive Patient Education  2017 [employer] Prevention in the Home Falls can cause injuries. They can happen to people of all ages. There are many things you can do to make your home safe and to help prevent falls. What can I do on the outside of my home?  Regularly fix the edges of walkways and driveways and fix any cracks.  Remove anything that might make you trip as you walk through a door, such as a raised step or threshold.  Trim any bushes or trees on the path to your home.  Use bright outdoor lighting.  Clear any walking paths of anything that might make someone trip, such as rocks or tools.  Regularly check to see if handrails are loose or broken. Make sure that both sides of any steps have handrails.  Any raised decks and porches should have guardrails on the edges.  Have any leaves, snow, or ice cleared regularly.  Use sand or salt on walking paths during winter.  Clean up any spills in your garage right away. This includes oil or grease spills. What  can I do in the bathroom?  Use night lights.  Install grab bars by the toilet and in the tub and shower. Do not use towel bars as grab bars.  Use non-skid mats or decals in the tub or shower.  If you need to sit down in the shower, use a plastic, non-slip stool.  Keep the floor dry. Clean up any water that spills on the floor as soon as it happens.  Remove soap buildup in the tub or shower regularly.  Attach bath mats securely with double-sided non-slip rug tape.  Do not have throw rugs and other things on the floor that can make you trip. What can I do in the bedroom?  Use night lights.  Make sure that you have a light by your bed that is easy to reach.  Do not use any sheets or blankets that are too big for your bed. They should not hang down onto the floor.  Have a firm chair that has side arms. You can use this for support while you get dressed.  Do not have throw rugs and other things on the floor that can make you trip. What can I do in the kitchen?  Clean up any spills right away.  Avoid walking on  wet floors.  Keep items that you use a lot in easy-to-reach places.  If you need to reach something above you, use a strong step stool that has a grab bar.  Keep electrical cords out of the way.  Do not use floor polish or wax that makes floors slippery. If you must use wax, use non-skid floor wax.  Do not have throw rugs and other things on the floor that can make you trip. What can I do with my stairs?  Do not leave any items on the stairs.  Make sure that there are handrails on both sides of the stairs and use them. Fix handrails that are broken or loose. Make sure that handrails are as long as the stairways.  Check any carpeting to make sure that it is firmly attached to the stairs. Fix any carpet that is loose or worn.  Avoid having throw rugs at the top or bottom of the stairs. If you do have throw rugs, attach them to the floor with carpet tape.  Make sure  that you have a light switch at the top of the stairs and the bottom of the stairs. If you do not have them, ask someone to add them for you. What else can I do to help prevent falls?  Wear shoes that:  Do not have high heels.  Have rubber bottoms.  Are comfortable and fit you well.  Are closed at the toe. Do not wear sandals.  If you use a stepladder:  Make sure that it is fully opened. Do not climb a closed stepladder.  Make sure that both sides of the stepladder are locked into place.  Ask someone to hold it for you, if possible.  Clearly mark and make sure that you can see:  Any grab bars or handrails.  First and last steps.  Where the edge of each step is.  Use tools that help you move around (mobility aids) if they are needed. These include:  Canes.  Walkers.  Scooters.  Crutches.  Turn on the lights when you go into a dark area. Replace any light bulbs as soon as they burn out.  Set up your furniture so you have a clear path. Avoid moving your furniture around.  If any of your floors are uneven, fix them.  If there are any pets around you, be aware of where they are.  Review your medicines with your doctor. Some medicines can make you feel dizzy. This can increase your chance of falling. Ask your doctor what other things that you can do to help prevent falls. This information is not intended to replace advice given to you by your health care provider. Make sure you discuss any questions you have with your health care provider. Document Released: 06/23/2009 Document Revised: 02/02/2016 Document Reviewed: 10/01/2014 Elsevier Interactive Patient Education  2017 [employer].

## 2020-08-11 ENCOUNTER — Encounter: Payer: Self-pay | Admitting: Family Medicine

## 2020-08-11 LAB — URINE CULTURE: Organism ID, Bacteria: NO GROWTH

## 2020-08-14 ENCOUNTER — Encounter: Payer: Self-pay | Admitting: Family Medicine

## 2020-08-15 ENCOUNTER — Other Ambulatory Visit: Payer: Self-pay | Admitting: Family Medicine

## 2020-08-15 DIAGNOSIS — N390 Urinary tract infection, site not specified: Secondary | ICD-10-CM

## 2020-08-15 DIAGNOSIS — N3040 Irradiation cystitis without hematuria: Secondary | ICD-10-CM | POA: Diagnosis not present

## 2020-08-15 MED ORDER — SULFAMETHOXAZOLE-TRIMETHOPRIM 800-160 MG PO TABS: 1.0000 | ORAL_TABLET | Freq: Two times a day (BID) | ORAL | 0 refills | Status: DC

## 2020-08-15 NOTE — Addendum Note (Signed)
Addended by: Minette Headland on: 08/15/2020 04:39 PM   Modules accepted: Orders

## 2020-08-19 LAB — URINE CULTURE

## 2020-08-22 ENCOUNTER — Encounter: Payer: Self-pay | Admitting: Family Medicine

## 2020-08-23 ENCOUNTER — Other Ambulatory Visit: Payer: Self-pay

## 2020-08-23 DIAGNOSIS — M0579 Rheumatoid arthritis with rheumatoid factor of multiple sites without organ or systems involvement: Secondary | ICD-10-CM

## 2020-08-23 MED ORDER — HYDROCODONE-ACETAMINOPHEN 7.5-325 MG PO TABS: 1.0000 | ORAL_TABLET | Freq: Four times a day (QID) | ORAL | 0 refills | Status: DC | PRN

## 2020-08-24 ENCOUNTER — Encounter: Payer: Self-pay | Admitting: Family Medicine

## 2020-08-25 DIAGNOSIS — B9620 Unspecified Escherichia coli [E. coli] as the cause of diseases classified elsewhere: Secondary | ICD-10-CM | POA: Diagnosis not present

## 2020-08-25 DIAGNOSIS — Z87891 Personal history of nicotine dependence: Secondary | ICD-10-CM | POA: Diagnosis not present

## 2020-08-25 DIAGNOSIS — N200 Calculus of kidney: Secondary | ICD-10-CM | POA: Diagnosis not present

## 2020-08-25 DIAGNOSIS — N390 Urinary tract infection, site not specified: Secondary | ICD-10-CM | POA: Diagnosis not present

## 2020-08-28 ENCOUNTER — Encounter: Payer: Self-pay | Admitting: Family Medicine

## 2020-09-10 ENCOUNTER — Other Ambulatory Visit: Payer: Self-pay | Admitting: Family Medicine

## 2020-09-12 NOTE — Telephone Encounter (Signed)
Requested medication (s) are due for refill today: no  Requested medication (s) are on the active medication list: yes Last refill:  07/18/2020  Future visit scheduled: no  Notes to clinic:  this refill cannot be delegated    Requested Prescriptions  Pending Prescriptions Disp Refills   promethazine (PHENERGAN) 25 MG tablet [Pharmacy Med Name: PROMETHAZINE 25 MG TABLET] 30 tablet 0    Sig: TAKE 0.5 TABLETS (12.5 MG TOTAL) BY MOUTH EVERY 6 (SIX) HOURS AS NEEDED FOR NAUSEA OR VOMITING.      Not Delegated - Gastroenterology: Antiemetics Failed - 09/10/2020  8:37 PM      Failed - This refill cannot be delegated      Passed - Valid encounter within last 6 months    Recent Outpatient Visits           1 month ago Recurrent UTI   [place], PA-C   2 months ago Chronic cystitis   [place], PA-C   11 months ago Symptoms involving urinary system   [employer] [place], Kelby Aline, FNP   11 months ago Erroneous encounter - disregard   [place], FNP   1 year ago Herpes zoster without complication   [place], Kelby Aline, FNP

## 2020-09-14 ENCOUNTER — Other Ambulatory Visit: Payer: Self-pay | Admitting: Family Medicine

## 2020-09-14 DIAGNOSIS — Z1231 Encounter for screening mammogram for malignant neoplasm of breast: Secondary | ICD-10-CM

## 2020-09-18 ENCOUNTER — Encounter: Payer: Self-pay | Admitting: Family Medicine

## 2020-09-19 ENCOUNTER — Encounter: Payer: Self-pay | Admitting: Family Medicine

## 2020-09-19 ENCOUNTER — Other Ambulatory Visit: Payer: Self-pay | Admitting: Family Medicine

## 2020-09-19 DIAGNOSIS — M0579 Rheumatoid arthritis with rheumatoid factor of multiple sites without organ or systems involvement: Secondary | ICD-10-CM

## 2020-09-19 MED ORDER — HYDROCODONE-ACETAMINOPHEN 7.5-325 MG PO TABS: 1.0000 | ORAL_TABLET | Freq: Four times a day (QID) | ORAL | 0 refills | Status: DC | PRN

## 2020-09-22 ENCOUNTER — Encounter: Payer: Self-pay | Admitting: Adult Health

## 2020-10-14 ENCOUNTER — Encounter: Payer: Self-pay | Admitting: Family Medicine

## 2020-10-14 DIAGNOSIS — M0579 Rheumatoid arthritis with rheumatoid factor of multiple sites without organ or systems involvement: Secondary | ICD-10-CM

## 2020-10-14 MED ORDER — HYDROCODONE-ACETAMINOPHEN 7.5-325 MG PO TABS: 1.0000 | ORAL_TABLET | Freq: Four times a day (QID) | ORAL | 0 refills | Status: DC | PRN

## 2020-10-14 MED ORDER — DICLOFENAC SODIUM 75 MG PO TBEC: 75.0000 mg | DELAYED_RELEASE_TABLET | Freq: Two times a day (BID) | ORAL | 5 refills | Status: DC

## 2020-10-18 DIAGNOSIS — N132 Hydronephrosis with renal and ureteral calculous obstruction: Secondary | ICD-10-CM | POA: Diagnosis not present

## 2020-10-18 DIAGNOSIS — N261 Atrophy of kidney (terminal): Secondary | ICD-10-CM | POA: Diagnosis not present

## 2020-10-18 DIAGNOSIS — Z87891 Personal history of nicotine dependence: Secondary | ICD-10-CM | POA: Diagnosis not present

## 2020-10-18 DIAGNOSIS — N200 Calculus of kidney: Secondary | ICD-10-CM | POA: Diagnosis not present

## 2020-10-18 DIAGNOSIS — M5386 Other specified dorsopathies, lumbar region: Secondary | ICD-10-CM | POA: Diagnosis not present

## 2020-10-18 DIAGNOSIS — N390 Urinary tract infection, site not specified: Secondary | ICD-10-CM | POA: Diagnosis not present

## 2020-10-18 DIAGNOSIS — Z87440 Personal history of urinary (tract) infections: Secondary | ICD-10-CM | POA: Diagnosis not present

## 2020-11-09 DIAGNOSIS — Z20822 Contact with and (suspected) exposure to covid-19: Secondary | ICD-10-CM | POA: Diagnosis not present

## 2020-11-10 ENCOUNTER — Encounter: Payer: Self-pay | Admitting: Family Medicine

## 2020-11-11 ENCOUNTER — Other Ambulatory Visit: Payer: Self-pay | Admitting: *Deleted

## 2020-11-11 DIAGNOSIS — M0579 Rheumatoid arthritis with rheumatoid factor of multiple sites without organ or systems involvement: Secondary | ICD-10-CM

## 2020-11-11 MED ORDER — HYDROCODONE-ACETAMINOPHEN 7.5-325 MG PO TABS: 1.0000 | ORAL_TABLET | Freq: Four times a day (QID) | ORAL | 0 refills | Status: DC | PRN

## 2020-11-14 DIAGNOSIS — I10 Essential (primary) hypertension: Secondary | ICD-10-CM | POA: Diagnosis not present

## 2020-11-14 DIAGNOSIS — N200 Calculus of kidney: Secondary | ICD-10-CM | POA: Diagnosis not present

## 2020-11-14 DIAGNOSIS — Z01818 Encounter for other preprocedural examination: Secondary | ICD-10-CM | POA: Diagnosis not present

## 2020-11-14 DIAGNOSIS — Y828 Other medical devices associated with adverse incidents: Secondary | ICD-10-CM | POA: Diagnosis not present

## 2020-11-14 DIAGNOSIS — N390 Urinary tract infection, site not specified: Secondary | ICD-10-CM | POA: Diagnosis not present

## 2020-11-14 DIAGNOSIS — T884XXS Failed or difficult intubation, sequela: Secondary | ICD-10-CM | POA: Diagnosis not present

## 2020-11-16 ENCOUNTER — Other Ambulatory Visit: Payer: Self-pay | Admitting: Family Medicine

## 2020-11-16 NOTE — Telephone Encounter (Signed)
Requested medication (s) are due for refill today: no  Requested medication (s) are on the active medication list: yes  Last refill: 09/12/2020  Future visit scheduled:  no  Notes to clinic: this refill cannot be delegated   Requested Prescriptions  Pending Prescriptions Disp Refills   promethazine (PHENERGAN) 25 MG tablet [Pharmacy Med Name: PROMETHAZINE 25 MG TABLET] 30 tablet 0    Sig: TAKE 0.5 TABLETS (12.5 MG TOTAL) BY MOUTH EVERY 6 (SIX) HOURS AS NEEDED FOR NAUSEA OR VOMITING.      Not Delegated - Gastroenterology: Antiemetics Failed - 11/16/2020  3:10 PM      Failed - This refill cannot be delegated      Passed - Valid encounter within last 6 months    Recent Outpatient Visits           3 months ago Recurrent UTI   [place], PA-C   4 months ago Chronic cystitis   [facility], Vickki Muff, PA-C   1 year ago Symptoms involving urinary system   [facility], Kelby Aline, FNP   1 year ago Erroneous encounter - disregard   [place], FNP   1 year ago Herpes zoster without complication   [place], Kelby Aline, FNP

## 2020-11-21 DIAGNOSIS — N132 Hydronephrosis with renal and ureteral calculous obstruction: Secondary | ICD-10-CM | POA: Diagnosis not present

## 2020-11-21 DIAGNOSIS — N201 Calculus of ureter: Secondary | ICD-10-CM | POA: Diagnosis not present

## 2020-11-21 DIAGNOSIS — H25813 Combined forms of age-related cataract, bilateral: Secondary | ICD-10-CM | POA: Diagnosis not present

## 2020-11-21 DIAGNOSIS — N3592 Unspecified urethral stricture, female: Secondary | ICD-10-CM | POA: Diagnosis not present

## 2020-11-21 DIAGNOSIS — M069 Rheumatoid arthritis, unspecified: Secondary | ICD-10-CM | POA: Diagnosis not present

## 2020-11-21 DIAGNOSIS — N136 Pyonephrosis: Secondary | ICD-10-CM | POA: Diagnosis not present

## 2020-11-21 DIAGNOSIS — G8929 Other chronic pain: Secondary | ICD-10-CM | POA: Diagnosis not present

## 2020-11-21 DIAGNOSIS — N189 Chronic kidney disease, unspecified: Secondary | ICD-10-CM | POA: Diagnosis not present

## 2020-11-21 DIAGNOSIS — N200 Calculus of kidney: Secondary | ICD-10-CM | POA: Diagnosis not present

## 2020-11-21 DIAGNOSIS — N952 Postmenopausal atrophic vaginitis: Secondary | ICD-10-CM | POA: Diagnosis not present

## 2020-11-21 DIAGNOSIS — I129 Hypertensive chronic kidney disease with stage 1 through stage 4 chronic kidney disease, or unspecified chronic kidney disease: Secondary | ICD-10-CM | POA: Diagnosis not present

## 2020-11-22 DIAGNOSIS — N189 Chronic kidney disease, unspecified: Secondary | ICD-10-CM | POA: Diagnosis not present

## 2020-11-22 DIAGNOSIS — G8929 Other chronic pain: Secondary | ICD-10-CM | POA: Diagnosis not present

## 2020-11-22 DIAGNOSIS — I129 Hypertensive chronic kidney disease with stage 1 through stage 4 chronic kidney disease, or unspecified chronic kidney disease: Secondary | ICD-10-CM | POA: Diagnosis not present

## 2020-11-22 DIAGNOSIS — N952 Postmenopausal atrophic vaginitis: Secondary | ICD-10-CM | POA: Diagnosis not present

## 2020-11-22 DIAGNOSIS — N3592 Unspecified urethral stricture, female: Secondary | ICD-10-CM | POA: Diagnosis not present

## 2020-11-22 DIAGNOSIS — N201 Calculus of ureter: Secondary | ICD-10-CM | POA: Diagnosis not present

## 2020-11-22 DIAGNOSIS — N200 Calculus of kidney: Secondary | ICD-10-CM | POA: Diagnosis not present

## 2020-11-22 DIAGNOSIS — N136 Pyonephrosis: Secondary | ICD-10-CM | POA: Diagnosis not present

## 2020-11-22 DIAGNOSIS — H25813 Combined forms of age-related cataract, bilateral: Secondary | ICD-10-CM | POA: Diagnosis not present

## 2020-11-22 DIAGNOSIS — M069 Rheumatoid arthritis, unspecified: Secondary | ICD-10-CM | POA: Diagnosis not present

## 2020-12-06 ENCOUNTER — Encounter: Payer: Self-pay | Admitting: Family Medicine

## 2020-12-08 ENCOUNTER — Other Ambulatory Visit: Payer: Self-pay | Admitting: Family Medicine

## 2020-12-08 DIAGNOSIS — M0579 Rheumatoid arthritis with rheumatoid factor of multiple sites without organ or systems involvement: Secondary | ICD-10-CM

## 2020-12-08 MED ORDER — HYDROCODONE-ACETAMINOPHEN 7.5-325 MG PO TABS: 1.0000 | ORAL_TABLET | Freq: Four times a day (QID) | ORAL | 0 refills | Status: DC | PRN

## 2020-12-08 NOTE — Telephone Encounter (Signed)
Medication Refill - Medication: HYDROcodone-acetaminophen (NORCO) 7.5-325 MG tablet   Almost out   Has the patient contacted their pharmacy? No. (Agent: If no, request that the patient contact the pharmacy for the refill.) (Agent: If yes, when and what did the pharmacy advise?)  Preferred Pharmacy (with phone number or street name):  [id number] [place]  [address] [place] [address]  Phone: [phone] Fax: [phone]     Agent: Please be advised that RX refills may take up to 3 business days. We ask that you follow-up with your pharmacy.

## 2020-12-08 NOTE — Telephone Encounter (Signed)
Requested medication (s) are due for refill today: yes  Requested medication (s) are on the active medication list: yes  Last refill:  11/11/2020  Future visit scheduled:yes  Notes to clinic:  this refill cannot be delegated    Requested Prescriptions  Pending Prescriptions Disp Refills   HYDROcodone-acetaminophen (NORCO) 7.5-325 MG tablet 30 tablet 0    Sig: Take 1 tablet by mouth every 6 (six) hours as needed for moderate pain (Take lowest effective dose,not recomended for long term use.). Reviewed controlled substance record in PMP Aware.      There is no refill protocol information for this order

## 2021-01-05 DIAGNOSIS — N904 Leukoplakia of vulva: Secondary | ICD-10-CM | POA: Diagnosis not present

## 2021-01-05 DIAGNOSIS — N390 Urinary tract infection, site not specified: Secondary | ICD-10-CM | POA: Diagnosis not present

## 2021-01-05 DIAGNOSIS — N393 Stress incontinence (female) (male): Secondary | ICD-10-CM | POA: Diagnosis not present

## 2021-01-06 ENCOUNTER — Encounter: Payer: Self-pay | Admitting: Family Medicine

## 2021-01-06 DIAGNOSIS — M0579 Rheumatoid arthritis with rheumatoid factor of multiple sites without organ or systems involvement: Secondary | ICD-10-CM

## 2021-01-09 ENCOUNTER — Encounter: Payer: Self-pay | Admitting: Family Medicine

## 2021-01-09 MED ORDER — HYDROCODONE-ACETAMINOPHEN 7.5-325 MG PO TABS
1.0000 | ORAL_TABLET | Freq: Four times a day (QID) | ORAL | 0 refills | Status: DC | PRN
Start: 2021-01-09 — End: 2021-01-31

## 2021-01-30 ENCOUNTER — Encounter: Payer: Self-pay | Admitting: Family Medicine

## 2021-01-31 ENCOUNTER — Other Ambulatory Visit: Payer: Self-pay | Admitting: Family Medicine

## 2021-01-31 DIAGNOSIS — M0579 Rheumatoid arthritis with rheumatoid factor of multiple sites without organ or systems involvement: Secondary | ICD-10-CM

## 2021-01-31 MED ORDER — HYDROCODONE-ACETAMINOPHEN 7.5-325 MG PO TABS
1.0000 | ORAL_TABLET | Freq: Four times a day (QID) | ORAL | 0 refills | Status: DC | PRN
Start: 2021-01-31 — End: 2021-03-06

## 2021-01-31 MED ORDER — PROMETHAZINE HCL 25 MG PO TABS
12.5000 mg | ORAL_TABLET | Freq: Four times a day (QID) | ORAL | 0 refills | Status: DC | PRN
Start: 2021-01-31 — End: 2021-03-12

## 2021-02-07 ENCOUNTER — Other Ambulatory Visit: Payer: Self-pay | Admitting: Family Medicine

## 2021-02-07 DIAGNOSIS — I10 Essential (primary) hypertension: Secondary | ICD-10-CM

## 2021-02-07 NOTE — Telephone Encounter (Signed)
Requested medication (s) are due for refill today:  no  Requested medication (s) are on the active medication list: yes  Last refill:  11/10/2020  Future visit scheduled: no  Notes to clinic: overdue for follow up on HTN Patient has had appt but not for this Review for refill    Requested Prescriptions  Pending Prescriptions Disp Refills   metoprolol tartrate (LOPRESSOR) 25 MG tablet [Pharmacy Med Name: METOPROLOL TARTRATE 25 MG TAB] 180 tablet 3    Sig: TAKE 1 TABLET BY MOUTH TWICE A DAY      Cardiovascular:  Beta Blockers Failed - 02/07/2021  1:37 AM      Failed - Valid encounter within last 6 months    Recent Outpatient Visits           6 months ago Recurrent UTI   [place], Vickki Muff, PA-C   7 months ago Chronic cystitis   [facility] [facility], Vickki Muff, PA-C   1 year ago Symptoms involving urinary system   [facility], Kelby Aline, [place]   1 year ago Erroneous encounter - disregard   [employer], Kelby Aline, [place]   1 year ago Herpes zoster without complication   [facility], Kelby Aline, FNP                Passed - Last BP in normal range    BP Readings from Last 1 Encounters:  07/21/20 132/79          Passed - Last Heart Rate in normal range    Pulse Readings from Last 1 Encounters:  07/21/20 73

## 2021-02-08 DIAGNOSIS — N2889 Other specified disorders of kidney and ureter: Secondary | ICD-10-CM | POA: Diagnosis not present

## 2021-02-08 DIAGNOSIS — N132 Hydronephrosis with renal and ureteral calculous obstruction: Secondary | ICD-10-CM | POA: Diagnosis not present

## 2021-02-08 DIAGNOSIS — Z87442 Personal history of urinary calculi: Secondary | ICD-10-CM | POA: Diagnosis not present

## 2021-02-08 DIAGNOSIS — N200 Calculus of kidney: Secondary | ICD-10-CM | POA: Diagnosis not present

## 2021-02-08 DIAGNOSIS — Z9889 Other specified postprocedural states: Secondary | ICD-10-CM | POA: Diagnosis not present

## 2021-02-14 ENCOUNTER — Encounter: Payer: Self-pay | Admitting: Family Medicine

## 2021-03-06 ENCOUNTER — Other Ambulatory Visit: Payer: Self-pay | Admitting: Family Medicine

## 2021-03-06 DIAGNOSIS — M0579 Rheumatoid arthritis with rheumatoid factor of multiple sites without organ or systems involvement: Secondary | ICD-10-CM

## 2021-03-06 MED ORDER — HYDROCODONE-ACETAMINOPHEN 7.5-325 MG PO TABS: 1.0000 | ORAL_TABLET | Freq: Four times a day (QID) | ORAL | 0 refills | Status: DC | PRN

## 2021-03-09 DIAGNOSIS — N393 Stress incontinence (female) (male): Secondary | ICD-10-CM | POA: Diagnosis not present

## 2021-03-12 ENCOUNTER — Other Ambulatory Visit: Payer: Self-pay | Admitting: Family Medicine

## 2021-03-12 DIAGNOSIS — M0579 Rheumatoid arthritis with rheumatoid factor of multiple sites without organ or systems involvement: Secondary | ICD-10-CM

## 2021-03-14 MED ORDER — PROMETHAZINE HCL 25 MG PO TABS: 12.5000 mg | ORAL_TABLET | Freq: Four times a day (QID) | ORAL | 0 refills | Status: DC | PRN

## 2021-03-20 DIAGNOSIS — Z993 Dependence on wheelchair: Secondary | ICD-10-CM | POA: Diagnosis not present

## 2021-03-20 DIAGNOSIS — N3946 Mixed incontinence: Secondary | ICD-10-CM | POA: Diagnosis not present

## 2021-03-20 DIAGNOSIS — N390 Urinary tract infection, site not specified: Secondary | ICD-10-CM | POA: Diagnosis not present

## 2021-04-04 ENCOUNTER — Other Ambulatory Visit: Payer: Self-pay | Admitting: Family Medicine

## 2021-04-04 ENCOUNTER — Encounter: Payer: Self-pay | Admitting: Family Medicine

## 2021-04-04 DIAGNOSIS — M0579 Rheumatoid arthritis with rheumatoid factor of multiple sites without organ or systems involvement: Secondary | ICD-10-CM

## 2021-04-05 ENCOUNTER — Encounter: Payer: Self-pay | Admitting: Family Medicine

## 2021-04-05 ENCOUNTER — Other Ambulatory Visit: Payer: Self-pay | Admitting: Family Medicine

## 2021-04-05 DIAGNOSIS — I10 Essential (primary) hypertension: Secondary | ICD-10-CM

## 2021-04-05 DIAGNOSIS — M0579 Rheumatoid arthritis with rheumatoid factor of multiple sites without organ or systems involvement: Secondary | ICD-10-CM

## 2021-04-05 NOTE — Telephone Encounter (Signed)
Requested medications are due for refill today.  yes  Requested medications are on the active medications list.  yes  Last refill. 03/14/2021 & 02/07/2021  Future visit scheduled.   no  Notes to clinic.  One medication is not delegated. Pt has not been seen for RA or hypertension in over 1 year.

## 2021-04-06 ENCOUNTER — Other Ambulatory Visit: Payer: Self-pay | Admitting: Family Medicine

## 2021-04-06 DIAGNOSIS — M0579 Rheumatoid arthritis with rheumatoid factor of multiple sites without organ or systems involvement: Secondary | ICD-10-CM

## 2021-04-06 MED ORDER — HYDROCODONE-ACETAMINOPHEN 7.5-325 MG PO TABS: 1.0000 | ORAL_TABLET | Freq: Four times a day (QID) | ORAL | 0 refills | Status: DC | PRN

## 2021-04-12 DIAGNOSIS — M0579 Rheumatoid arthritis with rheumatoid factor of multiple sites without organ or systems involvement: Secondary | ICD-10-CM | POA: Diagnosis not present

## 2021-04-12 DIAGNOSIS — M79604 Pain in right leg: Secondary | ICD-10-CM | POA: Diagnosis not present

## 2021-04-12 DIAGNOSIS — I10 Essential (primary) hypertension: Secondary | ICD-10-CM | POA: Diagnosis not present

## 2021-04-12 DIAGNOSIS — C539 Malignant neoplasm of cervix uteri, unspecified: Secondary | ICD-10-CM | POA: Diagnosis not present

## 2021-04-12 DIAGNOSIS — N200 Calculus of kidney: Secondary | ICD-10-CM | POA: Diagnosis not present

## 2021-04-12 DIAGNOSIS — M79605 Pain in left leg: Secondary | ICD-10-CM | POA: Diagnosis not present

## 2021-04-12 DIAGNOSIS — N390 Urinary tract infection, site not specified: Secondary | ICD-10-CM | POA: Diagnosis not present

## 2021-05-08 DIAGNOSIS — I10 Essential (primary) hypertension: Secondary | ICD-10-CM | POA: Diagnosis not present

## 2021-05-08 DIAGNOSIS — M79604 Pain in right leg: Secondary | ICD-10-CM | POA: Diagnosis not present

## 2021-05-08 DIAGNOSIS — N200 Calculus of kidney: Secondary | ICD-10-CM | POA: Diagnosis not present

## 2021-05-08 DIAGNOSIS — M79605 Pain in left leg: Secondary | ICD-10-CM | POA: Diagnosis not present

## 2021-05-08 DIAGNOSIS — R7989 Other specified abnormal findings of blood chemistry: Secondary | ICD-10-CM | POA: Diagnosis not present

## 2021-07-17 ENCOUNTER — Telehealth: Payer: Self-pay | Admitting: Family Medicine

## 2021-07-17 DIAGNOSIS — I10 Essential (primary) hypertension: Secondary | ICD-10-CM

## 2021-07-17 MED ORDER — METOPROLOL TARTRATE 25 MG PO TABS: 25.0000 mg | ORAL_TABLET | Freq: Two times a day (BID) | ORAL | 0 refills | Status: DC

## 2021-07-17 NOTE — Telephone Encounter (Signed)
[facility] for the following medications:  metoprolol tartrate (LOPRESSOR) 25 MG tablet   Please advise.  

## 2021-07-17 NOTE — Addendum Note (Signed)
Addended by: Ashley Royalty E on: 07/17/2021 04:45 PM   Modules accepted: Orders

## 2021-07-26 DIAGNOSIS — N3040 Irradiation cystitis without hematuria: Secondary | ICD-10-CM | POA: Diagnosis not present

## 2021-07-26 DIAGNOSIS — N393 Stress incontinence (female) (male): Secondary | ICD-10-CM | POA: Diagnosis not present

## 2021-07-26 DIAGNOSIS — Z01818 Encounter for other preprocedural examination: Secondary | ICD-10-CM | POA: Diagnosis not present

## 2021-07-26 DIAGNOSIS — N390 Urinary tract infection, site not specified: Secondary | ICD-10-CM | POA: Diagnosis not present

## 2021-07-31 ENCOUNTER — Telehealth: Payer: Self-pay | Admitting: Family Medicine

## 2021-07-31 NOTE — Telephone Encounter (Signed)
Jeanne [name] is calling as a courtsey with [facility] is calling to report that the pt is missing breast & colon screenings. As well as missing controlling high bp screenings. Please advise with the pt. CB- [phone]

## 2021-07-31 NOTE — Telephone Encounter (Signed)
Jeanne [name] is no longer a patient at [facility]. Patient established care with [facility] clinic on 04/12/2021 with Dr. [place] Pink.

## 2021-08-11 DIAGNOSIS — N3946 Mixed incontinence: Secondary | ICD-10-CM | POA: Diagnosis not present

## 2021-08-11 DIAGNOSIS — R54 Age-related physical debility: Secondary | ICD-10-CM | POA: Diagnosis not present

## 2021-08-11 DIAGNOSIS — L900 Lichen sclerosus et atrophicus: Secondary | ICD-10-CM | POA: Diagnosis not present

## 2021-08-11 DIAGNOSIS — N9089 Other specified noninflammatory disorders of vulva and perineum: Secondary | ICD-10-CM | POA: Diagnosis not present

## 2021-08-11 DIAGNOSIS — N183 Chronic kidney disease, stage 3 unspecified: Secondary | ICD-10-CM | POA: Diagnosis not present

## 2021-08-11 DIAGNOSIS — N3040 Irradiation cystitis without hematuria: Secondary | ICD-10-CM | POA: Diagnosis not present

## 2021-08-11 DIAGNOSIS — N923 Ovulation bleeding: Secondary | ICD-10-CM | POA: Diagnosis not present

## 2021-08-11 DIAGNOSIS — G8929 Other chronic pain: Secondary | ICD-10-CM | POA: Diagnosis not present

## 2021-08-11 DIAGNOSIS — Q525 Fusion of labia: Secondary | ICD-10-CM | POA: Diagnosis not present

## 2021-08-11 DIAGNOSIS — N3281 Overactive bladder: Secondary | ICD-10-CM | POA: Diagnosis not present

## 2021-08-11 DIAGNOSIS — I129 Hypertensive chronic kidney disease with stage 1 through stage 4 chronic kidney disease, or unspecified chronic kidney disease: Secondary | ICD-10-CM | POA: Diagnosis not present

## 2021-08-11 DIAGNOSIS — N3642 Intrinsic sphincter deficiency (ISD): Secondary | ICD-10-CM | POA: Diagnosis not present

## 2021-09-26 ENCOUNTER — Other Ambulatory Visit: Payer: Self-pay | Admitting: Family Medicine

## 2021-09-26 DIAGNOSIS — I10 Essential (primary) hypertension: Secondary | ICD-10-CM

## 2021-10-18 ENCOUNTER — Ambulatory Visit (INDEPENDENT_AMBULATORY_CARE_PROVIDER_SITE_OTHER): Payer: Medicare HMO

## 2021-10-18 ENCOUNTER — Encounter: Payer: Self-pay | Admitting: Orthopaedic Surgery

## 2021-10-18 ENCOUNTER — Other Ambulatory Visit: Payer: Self-pay

## 2021-10-18 ENCOUNTER — Ambulatory Visit: Payer: Medicare HMO | Admitting: Orthopaedic Surgery

## 2021-10-18 VITALS — Ht 64.0 in | Wt 172.0 lb

## 2021-10-18 DIAGNOSIS — T84010A Broken internal right hip prosthesis, initial encounter: Secondary | ICD-10-CM | POA: Diagnosis not present

## 2021-10-18 DIAGNOSIS — M25551 Pain in right hip: Secondary | ICD-10-CM

## 2021-10-18 NOTE — Progress Notes (Signed)
The patient is a 70 year old female who comes to me as a referral from a friend of hers that I have performed joint replacement surgery on in the past successfully.  She has an interesting and complicated history as it relates to her hips.  She had a hip replacement her right hip at age 89 in [place] and this was 51 years ago.  She then had her left hip replaced in the 1980s.  She has developed severe pain in her right hip for some time now and has to get around on a scooter or ambulate with a walker or crutches due to the severity of her pain.  She actually went to [facility] and saw an orthopedic reconstruction specialist for joint reconstruction surgery back in 2014.  She was told then that she needed revision surgery of her right hip and that it was very complicated.  She wanted to get an opinion from me today.  Her primary care physician had to give her some oxycodone just to help relieve her pain and that is helped some.  It is difficult to get good motion out of her right hip due to the severity of her pain.  An AP pelvis and lateral of the right hip shows a previous hip arthroplasty.  The hip itself appears to be migrating into the pelvis superiorly.  You can see a left hip replacement as well that is in a better position but her bone is quite osteopenic and this is incredibly complicated.  The right hip is a cemented component as well.  I did let her know that I have no experience as it relates to any type of reconstruction surgery that she may need given the complexity of this issue.  I do feel that her seeing the specialist at [facility] again is reasonable considering she does get a lot of her complicated medical care down at [facility].  We can certainly help facilitate a referral back to them again as it relates to her failed right total hip arthroplasty.

## 2021-10-19 ENCOUNTER — Other Ambulatory Visit: Payer: Self-pay

## 2021-10-19 DIAGNOSIS — T84010A Broken internal right hip prosthesis, initial encounter: Secondary | ICD-10-CM

## 2021-10-19 DIAGNOSIS — M25551 Pain in right hip: Secondary | ICD-10-CM

## 2021-10-20 ENCOUNTER — Encounter: Payer: Self-pay | Admitting: Orthopaedic Surgery

## 2021-11-28 DIAGNOSIS — I10 Essential (primary) hypertension: Secondary | ICD-10-CM | POA: Diagnosis not present

## 2021-11-28 DIAGNOSIS — M0579 Rheumatoid arthritis with rheumatoid factor of multiple sites without organ or systems involvement: Secondary | ICD-10-CM | POA: Diagnosis not present

## 2021-11-28 DIAGNOSIS — I129 Hypertensive chronic kidney disease with stage 1 through stage 4 chronic kidney disease, or unspecified chronic kidney disease: Secondary | ICD-10-CM | POA: Diagnosis not present

## 2021-11-28 DIAGNOSIS — M25551 Pain in right hip: Secondary | ICD-10-CM | POA: Diagnosis not present

## 2021-11-28 DIAGNOSIS — M25532 Pain in left wrist: Secondary | ICD-10-CM | POA: Diagnosis not present

## 2021-11-28 DIAGNOSIS — C539 Malignant neoplasm of cervix uteri, unspecified: Secondary | ICD-10-CM | POA: Diagnosis not present

## 2021-11-28 DIAGNOSIS — G8929 Other chronic pain: Secondary | ICD-10-CM | POA: Diagnosis not present

## 2021-11-28 DIAGNOSIS — N1832 Chronic kidney disease, stage 3b: Secondary | ICD-10-CM | POA: Diagnosis not present

## 2021-12-01 DIAGNOSIS — S52302P Unspecified fracture of shaft of left radius, subsequent encounter for closed fracture with malunion: Secondary | ICD-10-CM | POA: Diagnosis not present

## 2021-12-01 DIAGNOSIS — S52202P Unspecified fracture of shaft of left ulna, subsequent encounter for closed fracture with malunion: Secondary | ICD-10-CM | POA: Diagnosis not present

## 2021-12-28 DIAGNOSIS — S52302P Unspecified fracture of shaft of left radius, subsequent encounter for closed fracture with malunion: Secondary | ICD-10-CM | POA: Diagnosis not present

## 2021-12-28 DIAGNOSIS — S52202P Unspecified fracture of shaft of left ulna, subsequent encounter for closed fracture with malunion: Secondary | ICD-10-CM | POA: Diagnosis not present

## 2022-01-11 DIAGNOSIS — Y792 Prosthetic and other implants, materials and accessory orthopedic devices associated with adverse incidents: Secondary | ICD-10-CM | POA: Diagnosis not present

## 2022-01-11 DIAGNOSIS — T84010A Broken internal right hip prosthesis, initial encounter: Secondary | ICD-10-CM | POA: Diagnosis not present

## 2022-01-11 DIAGNOSIS — T84010D Broken internal right hip prosthesis, subsequent encounter: Secondary | ICD-10-CM | POA: Diagnosis not present

## 2022-01-11 DIAGNOSIS — Z96651 Presence of right artificial knee joint: Secondary | ICD-10-CM | POA: Diagnosis not present

## 2022-01-11 DIAGNOSIS — Z8541 Personal history of malignant neoplasm of cervix uteri: Secondary | ICD-10-CM | POA: Diagnosis not present

## 2022-01-11 DIAGNOSIS — Z96642 Presence of left artificial hip joint: Secondary | ICD-10-CM | POA: Diagnosis not present

## 2022-01-11 DIAGNOSIS — M8589 Other specified disorders of bone density and structure, multiple sites: Secondary | ICD-10-CM | POA: Diagnosis not present

## 2022-01-11 DIAGNOSIS — Z96641 Presence of right artificial hip joint: Secondary | ICD-10-CM | POA: Diagnosis not present

## 2022-01-11 DIAGNOSIS — M85859 Other specified disorders of bone density and structure, unspecified thigh: Secondary | ICD-10-CM | POA: Diagnosis not present

## 2022-01-11 DIAGNOSIS — Z471 Aftercare following joint replacement surgery: Secondary | ICD-10-CM | POA: Diagnosis not present

## 2022-01-11 DIAGNOSIS — S52202P Unspecified fracture of shaft of left ulna, subsequent encounter for closed fracture with malunion: Secondary | ICD-10-CM | POA: Diagnosis not present

## 2022-01-11 DIAGNOSIS — S52302P Unspecified fracture of shaft of left radius, subsequent encounter for closed fracture with malunion: Secondary | ICD-10-CM | POA: Diagnosis not present

## 2022-01-29 DIAGNOSIS — M25551 Pain in right hip: Secondary | ICD-10-CM | POA: Diagnosis not present

## 2022-01-29 DIAGNOSIS — M25552 Pain in left hip: Secondary | ICD-10-CM | POA: Diagnosis not present

## 2022-01-29 DIAGNOSIS — Y792 Prosthetic and other implants, materials and accessory orthopedic devices associated with adverse incidents: Secondary | ICD-10-CM | POA: Diagnosis not present

## 2022-01-29 DIAGNOSIS — T8484XA Pain due to internal orthopedic prosthetic devices, implants and grafts, initial encounter: Secondary | ICD-10-CM | POA: Diagnosis not present

## 2022-01-29 DIAGNOSIS — Z96649 Presence of unspecified artificial hip joint: Secondary | ICD-10-CM | POA: Diagnosis not present

## 2022-01-29 DIAGNOSIS — Z96641 Presence of right artificial hip joint: Secondary | ICD-10-CM | POA: Diagnosis not present

## 2022-01-29 DIAGNOSIS — Z96642 Presence of left artificial hip joint: Secondary | ICD-10-CM | POA: Diagnosis not present

## 2022-01-30 DIAGNOSIS — T8484XD Pain due to internal orthopedic prosthetic devices, implants and grafts, subsequent encounter: Secondary | ICD-10-CM | POA: Diagnosis not present

## 2022-01-30 DIAGNOSIS — X58XXXA Exposure to other specified factors, initial encounter: Secondary | ICD-10-CM | POA: Diagnosis not present

## 2022-01-30 DIAGNOSIS — S32059D Unspecified fracture of fifth lumbar vertebra, subsequent encounter for fracture with routine healing: Secondary | ICD-10-CM | POA: Diagnosis not present

## 2022-01-30 DIAGNOSIS — Z96649 Presence of unspecified artificial hip joint: Secondary | ICD-10-CM | POA: Diagnosis not present

## 2022-01-30 DIAGNOSIS — Y792 Prosthetic and other implants, materials and accessory orthopedic devices associated with adverse incidents: Secondary | ICD-10-CM | POA: Diagnosis not present

## 2022-01-30 DIAGNOSIS — Z96643 Presence of artificial hip joint, bilateral: Secondary | ICD-10-CM | POA: Diagnosis not present

## 2022-01-30 DIAGNOSIS — S32401A Unspecified fracture of right acetabulum, initial encounter for closed fracture: Secondary | ICD-10-CM | POA: Diagnosis not present

## 2022-02-15 DIAGNOSIS — T8451XA Infection and inflammatory reaction due to internal right hip prosthesis, initial encounter: Secondary | ICD-10-CM | POA: Diagnosis not present

## 2022-02-15 DIAGNOSIS — X58XXXA Exposure to other specified factors, initial encounter: Secondary | ICD-10-CM | POA: Diagnosis not present

## 2022-02-15 DIAGNOSIS — T8484XA Pain due to internal orthopedic prosthetic devices, implants and grafts, initial encounter: Secondary | ICD-10-CM | POA: Diagnosis not present

## 2022-02-15 DIAGNOSIS — Z96649 Presence of unspecified artificial hip joint: Secondary | ICD-10-CM | POA: Diagnosis not present

## 2022-02-15 DIAGNOSIS — T84010A Broken internal right hip prosthesis, initial encounter: Secondary | ICD-10-CM | POA: Diagnosis not present

## 2022-02-15 DIAGNOSIS — M810 Age-related osteoporosis without current pathological fracture: Secondary | ICD-10-CM | POA: Diagnosis not present

## 2022-03-07 DIAGNOSIS — E559 Vitamin D deficiency, unspecified: Secondary | ICD-10-CM | POA: Diagnosis not present

## 2022-03-12 DIAGNOSIS — M05752 Rheumatoid arthritis with rheumatoid factor of left hip without organ or systems involvement: Secondary | ICD-10-CM | POA: Diagnosis not present

## 2022-03-12 DIAGNOSIS — T884XXS Failed or difficult intubation, sequela: Secondary | ICD-10-CM | POA: Diagnosis not present

## 2022-03-12 DIAGNOSIS — Z79899 Other long term (current) drug therapy: Secondary | ICD-10-CM | POA: Diagnosis not present

## 2022-03-12 DIAGNOSIS — I10 Essential (primary) hypertension: Secondary | ICD-10-CM | POA: Diagnosis not present

## 2022-03-12 DIAGNOSIS — Z01818 Encounter for other preprocedural examination: Secondary | ICD-10-CM | POA: Diagnosis not present

## 2022-03-12 DIAGNOSIS — M05751 Rheumatoid arthritis with rheumatoid factor of right hip without organ or systems involvement: Secondary | ICD-10-CM | POA: Diagnosis not present

## 2022-03-12 DIAGNOSIS — I129 Hypertensive chronic kidney disease with stage 1 through stage 4 chronic kidney disease, or unspecified chronic kidney disease: Secondary | ICD-10-CM | POA: Diagnosis not present

## 2022-03-12 DIAGNOSIS — X58XXXS Exposure to other specified factors, sequela: Secondary | ICD-10-CM | POA: Diagnosis not present

## 2022-03-12 DIAGNOSIS — C539 Malignant neoplasm of cervix uteri, unspecified: Secondary | ICD-10-CM | POA: Diagnosis not present

## 2022-03-12 DIAGNOSIS — Z87891 Personal history of nicotine dependence: Secondary | ICD-10-CM | POA: Diagnosis not present

## 2022-03-12 DIAGNOSIS — N184 Chronic kidney disease, stage 4 (severe): Secondary | ICD-10-CM | POA: Diagnosis not present

## 2022-03-28 DIAGNOSIS — Z96649 Presence of unspecified artificial hip joint: Secondary | ICD-10-CM | POA: Diagnosis not present

## 2022-03-28 DIAGNOSIS — T8484XA Pain due to internal orthopedic prosthetic devices, implants and grafts, initial encounter: Secondary | ICD-10-CM | POA: Diagnosis not present

## 2022-03-28 DIAGNOSIS — T84010A Broken internal right hip prosthesis, initial encounter: Secondary | ICD-10-CM | POA: Diagnosis not present

## 2022-03-28 DIAGNOSIS — T8451XA Infection and inflammatory reaction due to internal right hip prosthesis, initial encounter: Secondary | ICD-10-CM | POA: Diagnosis not present

## 2022-04-09 DIAGNOSIS — Y792 Prosthetic and other implants, materials and accessory orthopedic devices associated with adverse incidents: Secondary | ICD-10-CM | POA: Diagnosis not present

## 2022-04-09 DIAGNOSIS — Z96641 Presence of right artificial hip joint: Secondary | ICD-10-CM | POA: Diagnosis not present

## 2022-04-09 DIAGNOSIS — N184 Chronic kidney disease, stage 4 (severe): Secondary | ICD-10-CM | POA: Diagnosis not present

## 2022-04-09 DIAGNOSIS — Z923 Personal history of irradiation: Secondary | ICD-10-CM | POA: Diagnosis not present

## 2022-04-09 DIAGNOSIS — T8450XD Infection and inflammatory reaction due to unspecified internal joint prosthesis, subsequent encounter: Secondary | ICD-10-CM | POA: Diagnosis not present

## 2022-04-09 DIAGNOSIS — T84038D Mechanical loosening of other internal prosthetic joint, subsequent encounter: Secondary | ICD-10-CM | POA: Diagnosis not present

## 2022-04-09 DIAGNOSIS — H9190 Unspecified hearing loss, unspecified ear: Secondary | ICD-10-CM | POA: Diagnosis not present

## 2022-04-09 DIAGNOSIS — G8918 Other acute postprocedural pain: Secondary | ICD-10-CM | POA: Diagnosis not present

## 2022-04-09 DIAGNOSIS — I129 Hypertensive chronic kidney disease with stage 1 through stage 4 chronic kidney disease, or unspecified chronic kidney disease: Secondary | ICD-10-CM | POA: Diagnosis not present

## 2022-04-09 DIAGNOSIS — M0800 Unspecified juvenile rheumatoid arthritis of unspecified site: Secondary | ICD-10-CM | POA: Diagnosis not present

## 2022-04-09 DIAGNOSIS — Z87891 Personal history of nicotine dependence: Secondary | ICD-10-CM | POA: Diagnosis not present

## 2022-04-09 DIAGNOSIS — Z8541 Personal history of malignant neoplasm of cervix uteri: Secondary | ICD-10-CM | POA: Diagnosis not present

## 2022-04-09 DIAGNOSIS — Z96649 Presence of unspecified artificial hip joint: Secondary | ICD-10-CM | POA: Diagnosis not present

## 2022-04-09 DIAGNOSIS — M06051 Rheumatoid arthritis without rheumatoid factor, right hip: Secondary | ICD-10-CM | POA: Diagnosis not present

## 2022-04-09 DIAGNOSIS — Z471 Aftercare following joint replacement surgery: Secondary | ICD-10-CM | POA: Diagnosis not present

## 2022-04-09 DIAGNOSIS — M65851 Other synovitis and tenosynovitis, right thigh: Secondary | ICD-10-CM | POA: Diagnosis not present

## 2022-04-09 DIAGNOSIS — T84030A Mechanical loosening of internal right hip prosthetic joint, initial encounter: Secondary | ICD-10-CM | POA: Diagnosis not present

## 2022-04-09 DIAGNOSIS — T8451XA Infection and inflammatory reaction due to internal right hip prosthesis, initial encounter: Secondary | ICD-10-CM | POA: Diagnosis not present

## 2022-04-09 DIAGNOSIS — Z8616 Personal history of COVID-19: Secondary | ICD-10-CM | POA: Diagnosis not present

## 2022-04-09 DIAGNOSIS — Z9221 Personal history of antineoplastic chemotherapy: Secondary | ICD-10-CM | POA: Diagnosis not present

## 2022-04-09 DIAGNOSIS — B9561 Methicillin susceptible Staphylococcus aureus infection as the cause of diseases classified elsewhere: Secondary | ICD-10-CM | POA: Diagnosis not present

## 2022-04-09 DIAGNOSIS — D62 Acute posthemorrhagic anemia: Secondary | ICD-10-CM | POA: Diagnosis not present

## 2022-04-09 DIAGNOSIS — Z87440 Personal history of urinary (tract) infections: Secondary | ICD-10-CM | POA: Diagnosis not present

## 2022-04-09 DIAGNOSIS — Z90710 Acquired absence of both cervix and uterus: Secondary | ICD-10-CM | POA: Diagnosis not present

## 2022-04-23 DIAGNOSIS — R531 Weakness: Secondary | ICD-10-CM | POA: Diagnosis not present

## 2022-04-23 DIAGNOSIS — T84038A Mechanical loosening of other internal prosthetic joint, initial encounter: Secondary | ICD-10-CM | POA: Diagnosis not present

## 2022-04-23 DIAGNOSIS — M6250 Muscle wasting and atrophy, not elsewhere classified, unspecified site: Secondary | ICD-10-CM | POA: Diagnosis not present

## 2022-04-23 DIAGNOSIS — R52 Pain, unspecified: Secondary | ICD-10-CM | POA: Diagnosis not present

## 2022-04-23 DIAGNOSIS — Z96649 Presence of unspecified artificial hip joint: Secondary | ICD-10-CM | POA: Diagnosis not present

## 2022-04-24 ENCOUNTER — Other Ambulatory Visit: Payer: Self-pay | Admitting: *Deleted

## 2022-04-24 DIAGNOSIS — I129 Hypertensive chronic kidney disease with stage 1 through stage 4 chronic kidney disease, or unspecified chronic kidney disease: Secondary | ICD-10-CM | POA: Diagnosis not present

## 2022-04-24 DIAGNOSIS — T8451XD Infection and inflammatory reaction due to internal right hip prosthesis, subsequent encounter: Secondary | ICD-10-CM | POA: Diagnosis not present

## 2022-04-24 DIAGNOSIS — Z96642 Presence of left artificial hip joint: Secondary | ICD-10-CM | POA: Diagnosis not present

## 2022-04-24 DIAGNOSIS — H25812 Combined forms of age-related cataract, left eye: Secondary | ICD-10-CM | POA: Diagnosis not present

## 2022-04-24 DIAGNOSIS — Z7982 Long term (current) use of aspirin: Secondary | ICD-10-CM | POA: Diagnosis not present

## 2022-04-24 DIAGNOSIS — N3040 Irradiation cystitis without hematuria: Secondary | ICD-10-CM | POA: Diagnosis not present

## 2022-04-24 DIAGNOSIS — M25571 Pain in right ankle and joints of right foot: Secondary | ICD-10-CM | POA: Diagnosis not present

## 2022-04-24 DIAGNOSIS — Z87891 Personal history of nicotine dependence: Secondary | ICD-10-CM | POA: Diagnosis not present

## 2022-04-24 DIAGNOSIS — G629 Polyneuropathy, unspecified: Secondary | ICD-10-CM | POA: Diagnosis not present

## 2022-04-24 DIAGNOSIS — Z96649 Presence of unspecified artificial hip joint: Secondary | ICD-10-CM | POA: Diagnosis not present

## 2022-04-24 DIAGNOSIS — R32 Unspecified urinary incontinence: Secondary | ICD-10-CM | POA: Diagnosis not present

## 2022-04-24 DIAGNOSIS — Z9181 History of falling: Secondary | ICD-10-CM | POA: Diagnosis not present

## 2022-04-24 DIAGNOSIS — Z87440 Personal history of urinary (tract) infections: Secondary | ICD-10-CM | POA: Diagnosis not present

## 2022-04-24 DIAGNOSIS — T84038A Mechanical loosening of other internal prosthetic joint, initial encounter: Secondary | ICD-10-CM | POA: Diagnosis not present

## 2022-04-24 DIAGNOSIS — U071 COVID-19: Secondary | ICD-10-CM | POA: Diagnosis not present

## 2022-04-24 DIAGNOSIS — Z87442 Personal history of urinary calculi: Secondary | ICD-10-CM | POA: Diagnosis not present

## 2022-04-24 DIAGNOSIS — A419 Sepsis, unspecified organism: Secondary | ICD-10-CM | POA: Diagnosis not present

## 2022-04-24 DIAGNOSIS — Z96651 Presence of right artificial knee joint: Secondary | ICD-10-CM | POA: Diagnosis not present

## 2022-04-24 DIAGNOSIS — M0800 Unspecified juvenile rheumatoid arthritis of unspecified site: Secondary | ICD-10-CM | POA: Diagnosis not present

## 2022-04-24 DIAGNOSIS — M25572 Pain in left ankle and joints of left foot: Secondary | ICD-10-CM | POA: Diagnosis not present

## 2022-04-24 DIAGNOSIS — Z85828 Personal history of other malignant neoplasm of skin: Secondary | ICD-10-CM | POA: Diagnosis not present

## 2022-04-24 DIAGNOSIS — N184 Chronic kidney disease, stage 4 (severe): Secondary | ICD-10-CM | POA: Diagnosis not present

## 2022-04-24 DIAGNOSIS — N179 Acute kidney failure, unspecified: Secondary | ICD-10-CM | POA: Diagnosis not present

## 2022-04-24 DIAGNOSIS — T84030D Mechanical loosening of internal right hip prosthetic joint, subsequent encounter: Secondary | ICD-10-CM | POA: Diagnosis not present

## 2022-04-24 DIAGNOSIS — N200 Calculus of kidney: Secondary | ICD-10-CM | POA: Diagnosis not present

## 2022-04-24 DIAGNOSIS — H2512 Age-related nuclear cataract, left eye: Secondary | ICD-10-CM | POA: Diagnosis not present

## 2022-04-24 DIAGNOSIS — M545 Low back pain, unspecified: Secondary | ICD-10-CM | POA: Diagnosis not present

## 2022-04-24 DIAGNOSIS — Z8541 Personal history of malignant neoplasm of cervix uteri: Secondary | ICD-10-CM | POA: Diagnosis not present

## 2022-04-24 DIAGNOSIS — C539 Malignant neoplasm of cervix uteri, unspecified: Secondary | ICD-10-CM | POA: Diagnosis not present

## 2022-04-24 DIAGNOSIS — T8451XA Infection and inflammatory reaction due to internal right hip prosthesis, initial encounter: Secondary | ICD-10-CM | POA: Diagnosis not present

## 2022-04-24 DIAGNOSIS — N3946 Mixed incontinence: Secondary | ICD-10-CM | POA: Diagnosis not present

## 2022-04-24 DIAGNOSIS — Z993 Dependence on wheelchair: Secondary | ICD-10-CM | POA: Diagnosis not present

## 2022-04-24 DIAGNOSIS — N390 Urinary tract infection, site not specified: Secondary | ICD-10-CM | POA: Diagnosis not present

## 2022-04-24 DIAGNOSIS — Z90710 Acquired absence of both cervix and uterus: Secondary | ICD-10-CM | POA: Diagnosis not present

## 2022-04-24 DIAGNOSIS — Z792 Long term (current) use of antibiotics: Secondary | ICD-10-CM | POA: Diagnosis not present

## 2022-04-24 DIAGNOSIS — Z96641 Presence of right artificial hip joint: Secondary | ICD-10-CM | POA: Diagnosis not present

## 2022-04-24 DIAGNOSIS — D631 Anemia in chronic kidney disease: Secondary | ICD-10-CM | POA: Diagnosis not present

## 2022-04-24 DIAGNOSIS — Z452 Encounter for adjustment and management of vascular access device: Secondary | ICD-10-CM | POA: Diagnosis not present

## 2022-04-24 DIAGNOSIS — M25562 Pain in left knee: Secondary | ICD-10-CM | POA: Diagnosis not present

## 2022-04-24 DIAGNOSIS — Z791 Long term (current) use of non-steroidal anti-inflammatories (NSAID): Secondary | ICD-10-CM | POA: Diagnosis not present

## 2022-04-24 DIAGNOSIS — D62 Acute posthemorrhagic anemia: Secondary | ICD-10-CM | POA: Diagnosis not present

## 2022-04-24 DIAGNOSIS — A0472 Enterocolitis due to Clostridium difficile, not specified as recurrent: Secondary | ICD-10-CM | POA: Diagnosis not present

## 2022-04-24 NOTE — Patient Outreach (Signed)
Nurse opened chart in error.  Emelia Loron RN, BSN [place] [phone] [email or web address]

## 2022-04-25 DIAGNOSIS — T84030D Mechanical loosening of internal right hip prosthetic joint, subsequent encounter: Secondary | ICD-10-CM | POA: Diagnosis not present

## 2022-04-25 DIAGNOSIS — Z87891 Personal history of nicotine dependence: Secondary | ICD-10-CM | POA: Diagnosis not present

## 2022-04-25 DIAGNOSIS — N3040 Irradiation cystitis without hematuria: Secondary | ICD-10-CM | POA: Diagnosis not present

## 2022-04-25 DIAGNOSIS — Z452 Encounter for adjustment and management of vascular access device: Secondary | ICD-10-CM | POA: Diagnosis not present

## 2022-04-25 DIAGNOSIS — Z90710 Acquired absence of both cervix and uterus: Secondary | ICD-10-CM | POA: Diagnosis not present

## 2022-04-25 DIAGNOSIS — D62 Acute posthemorrhagic anemia: Secondary | ICD-10-CM | POA: Diagnosis not present

## 2022-04-25 DIAGNOSIS — T84038A Mechanical loosening of other internal prosthetic joint, initial encounter: Secondary | ICD-10-CM | POA: Diagnosis not present

## 2022-04-25 DIAGNOSIS — I129 Hypertensive chronic kidney disease with stage 1 through stage 4 chronic kidney disease, or unspecified chronic kidney disease: Secondary | ICD-10-CM | POA: Diagnosis not present

## 2022-04-25 DIAGNOSIS — Z8541 Personal history of malignant neoplasm of cervix uteri: Secondary | ICD-10-CM | POA: Diagnosis not present

## 2022-04-25 DIAGNOSIS — Z7982 Long term (current) use of aspirin: Secondary | ICD-10-CM | POA: Diagnosis not present

## 2022-04-25 DIAGNOSIS — H25812 Combined forms of age-related cataract, left eye: Secondary | ICD-10-CM | POA: Diagnosis not present

## 2022-04-25 DIAGNOSIS — T8451XA Infection and inflammatory reaction due to internal right hip prosthesis, initial encounter: Secondary | ICD-10-CM | POA: Diagnosis not present

## 2022-04-25 DIAGNOSIS — Z96649 Presence of unspecified artificial hip joint: Secondary | ICD-10-CM | POA: Diagnosis not present

## 2022-04-25 DIAGNOSIS — H2512 Age-related nuclear cataract, left eye: Secondary | ICD-10-CM | POA: Diagnosis not present

## 2022-04-25 DIAGNOSIS — M0800 Unspecified juvenile rheumatoid arthritis of unspecified site: Secondary | ICD-10-CM | POA: Diagnosis not present

## 2022-04-25 DIAGNOSIS — Z96642 Presence of left artificial hip joint: Secondary | ICD-10-CM | POA: Diagnosis not present

## 2022-04-25 DIAGNOSIS — R32 Unspecified urinary incontinence: Secondary | ICD-10-CM | POA: Diagnosis not present

## 2022-04-25 DIAGNOSIS — A419 Sepsis, unspecified organism: Secondary | ICD-10-CM | POA: Diagnosis not present

## 2022-04-25 DIAGNOSIS — N200 Calculus of kidney: Secondary | ICD-10-CM | POA: Diagnosis not present

## 2022-04-25 DIAGNOSIS — N3946 Mixed incontinence: Secondary | ICD-10-CM | POA: Diagnosis not present

## 2022-04-25 DIAGNOSIS — Z96651 Presence of right artificial knee joint: Secondary | ICD-10-CM | POA: Diagnosis not present

## 2022-04-25 DIAGNOSIS — N184 Chronic kidney disease, stage 4 (severe): Secondary | ICD-10-CM | POA: Diagnosis not present

## 2022-04-25 DIAGNOSIS — C539 Malignant neoplasm of cervix uteri, unspecified: Secondary | ICD-10-CM | POA: Diagnosis not present

## 2022-04-26 DIAGNOSIS — Z96642 Presence of left artificial hip joint: Secondary | ICD-10-CM | POA: Diagnosis not present

## 2022-04-26 DIAGNOSIS — Z96649 Presence of unspecified artificial hip joint: Secondary | ICD-10-CM | POA: Diagnosis not present

## 2022-04-26 DIAGNOSIS — T8451XA Infection and inflammatory reaction due to internal right hip prosthesis, initial encounter: Secondary | ICD-10-CM | POA: Diagnosis not present

## 2022-04-26 DIAGNOSIS — Z96651 Presence of right artificial knee joint: Secondary | ICD-10-CM | POA: Diagnosis not present

## 2022-04-26 DIAGNOSIS — Z90710 Acquired absence of both cervix and uterus: Secondary | ICD-10-CM | POA: Diagnosis not present

## 2022-04-26 DIAGNOSIS — H25812 Combined forms of age-related cataract, left eye: Secondary | ICD-10-CM | POA: Diagnosis not present

## 2022-04-26 DIAGNOSIS — C539 Malignant neoplasm of cervix uteri, unspecified: Secondary | ICD-10-CM | POA: Diagnosis not present

## 2022-04-26 DIAGNOSIS — Z8541 Personal history of malignant neoplasm of cervix uteri: Secondary | ICD-10-CM | POA: Diagnosis not present

## 2022-04-26 DIAGNOSIS — H2512 Age-related nuclear cataract, left eye: Secondary | ICD-10-CM | POA: Diagnosis not present

## 2022-04-26 DIAGNOSIS — D62 Acute posthemorrhagic anemia: Secondary | ICD-10-CM | POA: Diagnosis not present

## 2022-04-26 DIAGNOSIS — A419 Sepsis, unspecified organism: Secondary | ICD-10-CM | POA: Diagnosis not present

## 2022-04-26 DIAGNOSIS — N3946 Mixed incontinence: Secondary | ICD-10-CM | POA: Diagnosis not present

## 2022-04-26 DIAGNOSIS — T84038A Mechanical loosening of other internal prosthetic joint, initial encounter: Secondary | ICD-10-CM | POA: Diagnosis not present

## 2022-04-26 DIAGNOSIS — Z87891 Personal history of nicotine dependence: Secondary | ICD-10-CM | POA: Diagnosis not present

## 2022-04-26 DIAGNOSIS — N184 Chronic kidney disease, stage 4 (severe): Secondary | ICD-10-CM | POA: Diagnosis not present

## 2022-04-26 DIAGNOSIS — Z7982 Long term (current) use of aspirin: Secondary | ICD-10-CM | POA: Diagnosis not present

## 2022-04-26 DIAGNOSIS — R32 Unspecified urinary incontinence: Secondary | ICD-10-CM | POA: Diagnosis not present

## 2022-04-26 DIAGNOSIS — M0800 Unspecified juvenile rheumatoid arthritis of unspecified site: Secondary | ICD-10-CM | POA: Diagnosis not present

## 2022-04-26 DIAGNOSIS — I129 Hypertensive chronic kidney disease with stage 1 through stage 4 chronic kidney disease, or unspecified chronic kidney disease: Secondary | ICD-10-CM | POA: Diagnosis not present

## 2022-04-26 DIAGNOSIS — Z452 Encounter for adjustment and management of vascular access device: Secondary | ICD-10-CM | POA: Diagnosis not present

## 2022-04-26 DIAGNOSIS — T84030D Mechanical loosening of internal right hip prosthetic joint, subsequent encounter: Secondary | ICD-10-CM | POA: Diagnosis not present

## 2022-04-26 DIAGNOSIS — N3040 Irradiation cystitis without hematuria: Secondary | ICD-10-CM | POA: Diagnosis not present

## 2022-04-26 DIAGNOSIS — N200 Calculus of kidney: Secondary | ICD-10-CM | POA: Diagnosis not present

## 2022-04-27 DIAGNOSIS — Z96649 Presence of unspecified artificial hip joint: Secondary | ICD-10-CM | POA: Diagnosis not present

## 2022-04-27 DIAGNOSIS — T84038A Mechanical loosening of other internal prosthetic joint, initial encounter: Secondary | ICD-10-CM | POA: Diagnosis not present

## 2022-04-28 DIAGNOSIS — T84038A Mechanical loosening of other internal prosthetic joint, initial encounter: Secondary | ICD-10-CM | POA: Diagnosis not present

## 2022-04-28 DIAGNOSIS — Z96649 Presence of unspecified artificial hip joint: Secondary | ICD-10-CM | POA: Diagnosis not present

## 2022-04-29 DIAGNOSIS — Z96649 Presence of unspecified artificial hip joint: Secondary | ICD-10-CM | POA: Diagnosis not present

## 2022-04-29 DIAGNOSIS — T84038A Mechanical loosening of other internal prosthetic joint, initial encounter: Secondary | ICD-10-CM | POA: Diagnosis not present

## 2022-04-30 DIAGNOSIS — Z96651 Presence of right artificial knee joint: Secondary | ICD-10-CM | POA: Diagnosis not present

## 2022-04-30 DIAGNOSIS — D62 Acute posthemorrhagic anemia: Secondary | ICD-10-CM | POA: Diagnosis not present

## 2022-04-30 DIAGNOSIS — I129 Hypertensive chronic kidney disease with stage 1 through stage 4 chronic kidney disease, or unspecified chronic kidney disease: Secondary | ICD-10-CM | POA: Diagnosis not present

## 2022-04-30 DIAGNOSIS — N3040 Irradiation cystitis without hematuria: Secondary | ICD-10-CM | POA: Diagnosis not present

## 2022-04-30 DIAGNOSIS — T84038A Mechanical loosening of other internal prosthetic joint, initial encounter: Secondary | ICD-10-CM | POA: Diagnosis not present

## 2022-04-30 DIAGNOSIS — H25812 Combined forms of age-related cataract, left eye: Secondary | ICD-10-CM | POA: Diagnosis not present

## 2022-04-30 DIAGNOSIS — N3946 Mixed incontinence: Secondary | ICD-10-CM | POA: Diagnosis not present

## 2022-04-30 DIAGNOSIS — Z96649 Presence of unspecified artificial hip joint: Secondary | ICD-10-CM | POA: Diagnosis not present

## 2022-04-30 DIAGNOSIS — T8451XA Infection and inflammatory reaction due to internal right hip prosthesis, initial encounter: Secondary | ICD-10-CM | POA: Diagnosis not present

## 2022-04-30 DIAGNOSIS — Z87891 Personal history of nicotine dependence: Secondary | ICD-10-CM | POA: Diagnosis not present

## 2022-04-30 DIAGNOSIS — A419 Sepsis, unspecified organism: Secondary | ICD-10-CM | POA: Diagnosis not present

## 2022-04-30 DIAGNOSIS — Z8541 Personal history of malignant neoplasm of cervix uteri: Secondary | ICD-10-CM | POA: Diagnosis not present

## 2022-04-30 DIAGNOSIS — T84030D Mechanical loosening of internal right hip prosthetic joint, subsequent encounter: Secondary | ICD-10-CM | POA: Diagnosis not present

## 2022-04-30 DIAGNOSIS — M0800 Unspecified juvenile rheumatoid arthritis of unspecified site: Secondary | ICD-10-CM | POA: Diagnosis not present

## 2022-04-30 DIAGNOSIS — R32 Unspecified urinary incontinence: Secondary | ICD-10-CM | POA: Diagnosis not present

## 2022-04-30 DIAGNOSIS — Z90710 Acquired absence of both cervix and uterus: Secondary | ICD-10-CM | POA: Diagnosis not present

## 2022-04-30 DIAGNOSIS — H2512 Age-related nuclear cataract, left eye: Secondary | ICD-10-CM | POA: Diagnosis not present

## 2022-04-30 DIAGNOSIS — N184 Chronic kidney disease, stage 4 (severe): Secondary | ICD-10-CM | POA: Diagnosis not present

## 2022-04-30 DIAGNOSIS — Z452 Encounter for adjustment and management of vascular access device: Secondary | ICD-10-CM | POA: Diagnosis not present

## 2022-04-30 DIAGNOSIS — N200 Calculus of kidney: Secondary | ICD-10-CM | POA: Diagnosis not present

## 2022-04-30 DIAGNOSIS — C539 Malignant neoplasm of cervix uteri, unspecified: Secondary | ICD-10-CM | POA: Diagnosis not present

## 2022-04-30 DIAGNOSIS — Z7982 Long term (current) use of aspirin: Secondary | ICD-10-CM | POA: Diagnosis not present

## 2022-04-30 DIAGNOSIS — Z96642 Presence of left artificial hip joint: Secondary | ICD-10-CM | POA: Diagnosis not present

## 2022-05-01 ENCOUNTER — Other Ambulatory Visit: Payer: Self-pay

## 2022-05-01 ENCOUNTER — Emergency Department (HOSPITAL_COMMUNITY): Payer: Medicare HMO

## 2022-05-01 ENCOUNTER — Emergency Department (HOSPITAL_COMMUNITY)
Admission: EM | Admit: 2022-05-01 | Discharge: 2022-05-02 | Disposition: A | Discharge status: Home / Self Care | Payer: Medicare HMO | Attending: Emergency Medicine | Admitting: Emergency Medicine

## 2022-05-01 ENCOUNTER — Encounter (HOSPITAL_COMMUNITY): Payer: Self-pay | Admitting: Emergency Medicine

## 2022-05-01 DIAGNOSIS — Z79899 Other long term (current) drug therapy: Secondary | ICD-10-CM | POA: Insufficient documentation

## 2022-05-01 DIAGNOSIS — R000 Tachycardia, unspecified: Secondary | ICD-10-CM | POA: Diagnosis not present

## 2022-05-01 DIAGNOSIS — H2512 Age-related nuclear cataract, left eye: Secondary | ICD-10-CM | POA: Diagnosis not present

## 2022-05-01 DIAGNOSIS — I10 Essential (primary) hypertension: Secondary | ICD-10-CM | POA: Diagnosis not present

## 2022-05-01 DIAGNOSIS — T8451XA Infection and inflammatory reaction due to internal right hip prosthesis, initial encounter: Secondary | ICD-10-CM | POA: Insufficient documentation

## 2022-05-01 DIAGNOSIS — Z8541 Personal history of malignant neoplasm of cervix uteri: Secondary | ICD-10-CM | POA: Diagnosis not present

## 2022-05-01 DIAGNOSIS — I129 Hypertensive chronic kidney disease with stage 1 through stage 4 chronic kidney disease, or unspecified chronic kidney disease: Secondary | ICD-10-CM | POA: Diagnosis not present

## 2022-05-01 DIAGNOSIS — Z7982 Long term (current) use of aspirin: Secondary | ICD-10-CM | POA: Diagnosis not present

## 2022-05-01 DIAGNOSIS — H25812 Combined forms of age-related cataract, left eye: Secondary | ICD-10-CM | POA: Diagnosis not present

## 2022-05-01 DIAGNOSIS — T84030D Mechanical loosening of internal right hip prosthetic joint, subsequent encounter: Secondary | ICD-10-CM | POA: Diagnosis not present

## 2022-05-01 DIAGNOSIS — I959 Hypotension, unspecified: Secondary | ICD-10-CM | POA: Diagnosis not present

## 2022-05-01 DIAGNOSIS — E538 Deficiency of other specified B group vitamins: Secondary | ICD-10-CM

## 2022-05-01 DIAGNOSIS — Z96641 Presence of right artificial hip joint: Secondary | ICD-10-CM | POA: Insufficient documentation

## 2022-05-01 DIAGNOSIS — Z452 Encounter for adjustment and management of vascular access device: Secondary | ICD-10-CM | POA: Diagnosis not present

## 2022-05-01 DIAGNOSIS — D649 Anemia, unspecified: Secondary | ICD-10-CM | POA: Diagnosis not present

## 2022-05-01 DIAGNOSIS — R7889 Finding of other specified substances, not normally found in blood: Secondary | ICD-10-CM | POA: Diagnosis not present

## 2022-05-01 DIAGNOSIS — R32 Unspecified urinary incontinence: Secondary | ICD-10-CM | POA: Diagnosis not present

## 2022-05-01 DIAGNOSIS — T84038A Mechanical loosening of other internal prosthetic joint, initial encounter: Secondary | ICD-10-CM | POA: Diagnosis not present

## 2022-05-01 DIAGNOSIS — N3946 Mixed incontinence: Secondary | ICD-10-CM | POA: Diagnosis not present

## 2022-05-01 DIAGNOSIS — C539 Malignant neoplasm of cervix uteri, unspecified: Secondary | ICD-10-CM | POA: Diagnosis not present

## 2022-05-01 DIAGNOSIS — Z96642 Presence of left artificial hip joint: Secondary | ICD-10-CM | POA: Diagnosis not present

## 2022-05-01 DIAGNOSIS — Z96649 Presence of unspecified artificial hip joint: Secondary | ICD-10-CM | POA: Diagnosis not present

## 2022-05-01 DIAGNOSIS — Y838 Other surgical procedures as the cause of abnormal reaction of the patient, or of later complication, without mention of misadventure at the time of the procedure: Secondary | ICD-10-CM | POA: Diagnosis not present

## 2022-05-01 DIAGNOSIS — R7989 Other specified abnormal findings of blood chemistry: Secondary | ICD-10-CM | POA: Diagnosis not present

## 2022-05-01 DIAGNOSIS — Z87891 Personal history of nicotine dependence: Secondary | ICD-10-CM | POA: Diagnosis not present

## 2022-05-01 DIAGNOSIS — A419 Sepsis, unspecified organism: Secondary | ICD-10-CM | POA: Diagnosis not present

## 2022-05-01 DIAGNOSIS — R001 Bradycardia, unspecified: Secondary | ICD-10-CM | POA: Diagnosis not present

## 2022-05-01 DIAGNOSIS — D62 Acute posthemorrhagic anemia: Secondary | ICD-10-CM | POA: Diagnosis not present

## 2022-05-01 DIAGNOSIS — I700 Atherosclerosis of aorta: Secondary | ICD-10-CM | POA: Diagnosis not present

## 2022-05-01 DIAGNOSIS — Z96651 Presence of right artificial knee joint: Secondary | ICD-10-CM | POA: Diagnosis not present

## 2022-05-01 DIAGNOSIS — M0800 Unspecified juvenile rheumatoid arthritis of unspecified site: Secondary | ICD-10-CM | POA: Diagnosis not present

## 2022-05-01 DIAGNOSIS — N3040 Irradiation cystitis without hematuria: Secondary | ICD-10-CM | POA: Diagnosis not present

## 2022-05-01 DIAGNOSIS — N184 Chronic kidney disease, stage 4 (severe): Secondary | ICD-10-CM | POA: Diagnosis not present

## 2022-05-01 DIAGNOSIS — Z90710 Acquired absence of both cervix and uterus: Secondary | ICD-10-CM | POA: Diagnosis not present

## 2022-05-01 DIAGNOSIS — D519 Vitamin B12 deficiency anemia, unspecified: Secondary | ICD-10-CM | POA: Diagnosis not present

## 2022-05-01 DIAGNOSIS — N200 Calculus of kidney: Secondary | ICD-10-CM | POA: Diagnosis not present

## 2022-05-01 LAB — IRON AND TIBC
Iron: 33 ug/dL (ref 28–170)
Saturation Ratios: 16 % (ref 10.4–31.8)
TIBC: 213 ug/dL — ABNORMAL LOW (ref 250–450)
UIBC: 180 ug/dL

## 2022-05-01 LAB — CBC WITH DIFFERENTIAL/PLATELET
Abs Immature Granulocytes: 0.07 10*3/uL (ref 0.00–0.07)
Basophils Absolute: 0 10*3/uL (ref 0.0–0.1)
Basophils Relative: 1 %
Eosinophils Absolute: 0.1 10*3/uL (ref 0.0–0.5)
Eosinophils Relative: 4 %
HCT: 25.3 % — ABNORMAL LOW (ref 36.0–46.0)
Hemoglobin: 7.4 g/dL — ABNORMAL LOW (ref 12.0–15.0)
Immature Granulocytes: 2 %
Lymphocytes Relative: 17 %
Lymphs Abs: 0.5 10*3/uL — ABNORMAL LOW (ref 0.7–4.0)
MCH: 29.5 pg (ref 26.0–34.0)
MCHC: 29.2 g/dL — ABNORMAL LOW (ref 30.0–36.0)
MCV: 100.8 fL — ABNORMAL HIGH (ref 80.0–100.0)
Monocytes Absolute: 0.3 10*3/uL (ref 0.1–1.0)
Monocytes Relative: 9 %
Neutro Abs: 2 10*3/uL (ref 1.7–7.7)
Neutrophils Relative %: 67 %
Platelets: 152 10*3/uL (ref 150–400)
RBC: 2.51 MIL/uL — ABNORMAL LOW (ref 3.87–5.11)
RDW: 16.2 % — ABNORMAL HIGH (ref 11.5–15.5)
WBC: 3 10*3/uL — ABNORMAL LOW (ref 4.0–10.5)
nRBC: 0 % (ref 0.0–0.2)

## 2022-05-01 LAB — COMPREHENSIVE METABOLIC PANEL
ALT: 14 U/L (ref 0–44)
AST: 30 U/L (ref 15–41)
Albumin: 2.5 g/dL — ABNORMAL LOW (ref 3.5–5.0)
Alkaline Phosphatase: 147 U/L — ABNORMAL HIGH (ref 38–126)
Anion gap: 8 (ref 5–15)
BUN: 15 mg/dL (ref 8–23)
CO2: 20 mmol/L — ABNORMAL LOW (ref 22–32)
Calcium: 8.1 mg/dL — ABNORMAL LOW (ref 8.9–10.3)
Chloride: 111 mmol/L (ref 98–111)
Creatinine, Ser: 1.54 mg/dL — ABNORMAL HIGH (ref 0.44–1.00)
GFR, Estimated: 36 mL/min — ABNORMAL LOW (ref >60)
Glucose, Bld: 114 mg/dL — ABNORMAL HIGH (ref 70–99)
Potassium: 4.5 mmol/L (ref 3.5–5.1)
Sodium: 139 mmol/L (ref 135–145)
Total Bilirubin: 0.3 mg/dL (ref 0.3–1.2)
Total Protein: 5.4 g/dL — ABNORMAL LOW (ref 6.5–8.1)

## 2022-05-01 LAB — RETICULOCYTES
Immature Retic Fract: 28.4 % — ABNORMAL HIGH (ref 2.3–15.9)
RBC.: 2.44 MIL/uL — ABNORMAL LOW (ref 3.87–5.11)
Retic Count, Absolute: 86.6 10*3/uL (ref 19.0–186.0)
Retic Ct Pct: 3.6 % — ABNORMAL HIGH (ref 0.4–3.1)

## 2022-05-01 LAB — ABO/RH: ABO/RH(D): O POS

## 2022-05-01 LAB — FOLATE: Folate: 9.8 ng/mL (ref >5.9)

## 2022-05-01 LAB — FERRITIN: Ferritin: 241 ng/mL (ref 11–307)

## 2022-05-01 LAB — PREPARE RBC (CROSSMATCH)

## 2022-05-01 LAB — VITAMIN B12: Vitamin B-12: 148 pg/mL — ABNORMAL LOW (ref 180–914)

## 2022-05-01 MED ORDER — SODIUM CHLORIDE 0.9 % IV SOLN
2.0000 g | Freq: Once | INTRAVENOUS | Status: AC
  Administered 2022-05-01: 2 g via INTRAVENOUS

## 2022-05-01 MED ORDER — PROMETHAZINE HCL 25 MG PO TABS: 12.5000 mg | ORAL_TABLET | ORAL | Status: DC | PRN

## 2022-05-01 MED ORDER — SODIUM CHLORIDE 0.9% IV SOLUTION: Freq: Once | INTRAVENOUS | Status: AC

## 2022-05-01 MED ORDER — ONDANSETRON 4 MG PO TBDP
4.0000 mg | ORAL_TABLET | ORAL | Status: AC | PRN
  Administered 2022-05-02: 4 mg via ORAL
  Filled 2022-05-01: qty 1

## 2022-05-01 MED ORDER — SODIUM CHLORIDE 0.9 % IV SOLN
650.0000 mg | Freq: Once | INTRAVENOUS | Status: AC
  Administered 2022-05-01: 650 mg via INTRAVENOUS

## 2022-05-01 NOTE — ED Provider Notes (Signed)
[facility] EMERGENCY DEPARTMENT Provider Note   CSN: [id number] Arrival date & time: 05/01/22  1938     History  Chief Complaint  Patient presents with   Hip Infection    Jeanne [name] is a 71 y.o. female.  71 year old female with history of hypertension, currently receiving PICC line antibiotics for right hip infection with recent explant of her hip prosthesis, currently has rod in place, brought in by EMS from home with anemia.  Patient reports her baseline hemoglobin is in the mid to upper 8 range, was called by her PCP and told that her hemoglobin was low 6s, this was verified with repeat draw prior to patient being recommended for evaluation in the emergency room and transfusion.  Patient reports prior transfusions, is agreeable with blood transfusion today.  Patient's husband is at bedside, has been caring for her.  Both agree that her stool has appeared normal, soft and brown and does not appear bloody.  Patient is not anticoagulated.       Home Medications Prior to Admission medications   Medication Sig Start Date End Date Taking? Authorizing Provider  diclofenac (VOLTAREN) 75 MG EC tablet Take 1 tablet (75 mg total) by mouth 2 (two) times daily. 10/14/20   Mar Daring, PA-C  HYDROcodone-acetaminophen (NORCO) 7.5-325 MG tablet Take 1 tablet by mouth every 6 (six) hours as needed for moderate pain (Take lowest effective dose,not recomended for long term use.). Reviewed controlled substance record in PMP Aware. Patient not taking: Reported on 10/18/2021 04/06/21   Chrism[place], Vickki Muff, PA-C  lactobacillus acidophilus (BACID) TABS tablet Take 1 tablet by mouth daily.    [provider]  lidocaine-prilocaine (EMLA) cream APPLY SPARINGLY 2-3 TIMES A DAY TO AREA OF RADIATION THERAPY IRRITATION. 05/11/20   Bacigalupo, Dionne Bucy, MD  metoprolol tartrate (LOPRESSOR) 25 MG tablet Take 1 tablet (25 mg total) by mouth 2 (two) times daily. Please schedule an  office visit before anymore refills. 07/17/21   Gwyneth Sprout, FNP  oxyCODONE (OXY IR/ROXICODONE) 5 MG immediate release tablet Take 5-10 mg by mouth every 4 (four) hours as needed. 10/11/21   [provider]  Probiotic Product (PROBIOTIC PO) Take by mouth daily.  Patient not taking: Reported on 08/10/2020    [provider]  promethazine (PHENERGAN) 25 MG tablet TAKE 0.5 TABLETS (12.5 MG TOTAL) BY MOUTH EVERY 6 (SIX) HOURS AS NEEDED FOR NAUSEA OR VOMITING. 04/06/21   Chrismon, Vickki Muff, PA-C  sulfamethoxazole-trimethoprim (BACTRIM DS) 800-160 MG tablet Take 1 tablet by mouth 2 (two) times daily. 08/15/20   Chrismon, Vickki Muff, PA-C      Allergies    Codeine and Augmentin [amoxicillin-pot clavulanate]    Review of Systems   Review of Systems Negative except as per HPI Physical Exam Updated Vital Signs BP 109/61   Pulse 83   Temp 99 F (37.2 C) (Oral)   Resp 19   Ht '5\' 4"'$  (1.626 m)   Wt 76.7 kg   SpO2 98%   BMI 29.01 kg/m  Physical Exam Vitals and nursing note reviewed.  Constitutional:      General: She is not in acute distress.    Appearance: She is well-developed. She is not diaphoretic.  HENT:     Head: Normocephalic and atraumatic.  Cardiovascular:     Rate and Rhythm: Normal rate and regular rhythm.     Heart sounds: Normal heart sounds.  Pulmonary:     Effort: Pulmonary effort is normal.  Breath sounds: Normal breath sounds.  Abdominal:     Palpations: Abdomen is soft.     Tenderness: There is no abdominal tenderness.  Musculoskeletal:     Right lower leg: No edema.     Left lower leg: No edema.  Skin:    General: Skin is warm and dry.     Coloration: Skin is pale.  Neurological:     Mental Status: She is alert and oriented to person, place, and time.     Sensory: No sensory deficit.  Psychiatric:        Behavior: Behavior normal.     ED Results / Procedures / Treatments   Labs (all labs ordered are listed, but only abnormal results are  displayed) Labs Reviewed  VITAMIN B12 - Abnormal; Notable for the following components:      Result Value   Vitamin B-12 148 (*)    All other components within normal limits  IRON AND TIBC - Abnormal; Notable for the following components:   TIBC 213 (*)    All other components within normal limits  RETICULOCYTES - Abnormal; Notable for the following components:   Retic Ct Pct 3.6 (*)    RBC. 2.44 (*)    Immature Retic Fract 28.4 (*)    All other components within normal limits  COMPREHENSIVE METABOLIC PANEL - Abnormal; Notable for the following components:   CO2 20 (*)    Glucose, Bld 114 (*)    Creatinine, Ser 1.54 (*)    Calcium 8.1 (*)    Total Protein 5.4 (*)    Albumin 2.5 (*)    Alkaline Phosphatase 147 (*)    GFR, Estimated 36 (*)    All other components within normal limits  CBC WITH DIFFERENTIAL/PLATELET - Abnormal; Notable for the following components:   WBC 3.0 (*)    RBC 2.51 (*)    Hemoglobin 7.4 (*)    HCT 25.3 (*)    MCV 100.8 (*)    MCHC 29.2 (*)    RDW 16.2 (*)    Lymphs Abs 0.5 (*)    All other components within normal limits  FOLATE  FERRITIN  POC OCCULT BLOOD, ED  TYPE AND SCREEN  ABO/RH  PREPARE RBC (CROSSMATCH)    EKG None  Radiology DG Chest 1 View  Result Date: 05/01/2022 CLINICAL DATA:  PICC placement. EXAM: CHEST  1 VIEW COMPARISON:  Chest radiographs 04/08/2012 FINDINGS: A right upper extremity PICC has been placed and terminates over the lower SVC. The cardiomediastinal silhouette is within normal limits. Aortic atherosclerosis is noted. No airspace consolidation, edema, pleural effusion, or pneumothorax is identified. No acute osseous abnormality is seen. IMPRESSION: PICC terminates over the lower SVC. No evidence of acute cardiopulmonary disease. Electronically Signed   By: Logan Bores M.D.   On: 05/01/2022 21:06    Procedures Procedures    Medications Ordered in ED Medications  promethazine (PHENERGAN) tablet 12.5 mg (has no  administration in time range)  ondansetron (ZOFRAN-ODT) disintegrating tablet 4 mg (has no administration in time range)  0.9 %  sodium chloride infusion (Manually program via Guardrails IV Fluids) (0 mLs Intravenous Hold 05/01/22 2134)  **Patient Supplied** cefTRIAXON (ROCEPHIN) 2g in 50 mL NS elastomeric ball (2 g Intravenous New Bag/Given 05/01/22 2301)  **Patient Supplied**DAPTOmycin (CUBICIN) 650 mg / 32.5 NS elastomeric ball (650 mg Intravenous New Bag/Given 05/01/22 2233)    ED Course/ Medical Decision Making/ A&P  Medical Decision Making Amount and/or Complexity of Data Reviewed Labs: ordered. Radiology: ordered.  Risk Prescription drug management.   This patient presents to the ED for concern of anemia, this involves an extensive number of treatment options, and is a complaint that carries with it a high risk of complications and morbidity.  The differential diagnosis includes but not limited to IDA, gi bleed,    Co morbidities that complicate the patient evaluation  Hypertension, right total hip arthroplasty with recent explant   Additional history obtained:  Additional history obtained from husband at bedside who assist with history as above External records from outside source obtained and reviewed including recent labs on file for comparison   Lab Tests:  I Ordered, and personally interpreted labs.  The pertinent results include: CBC with hemoglobin of 7.4.  CMP with slight increase in creatinine today to 1.54, previously 1.2 on 04/23/2022.  Prior hemoglobin of 8.8 on 04/23/2022.  B12 low at 148.  TIBC low at 213, iron normal.  Ferritin and folate normal.   Problem List / ED Course / Critical interventions / Medication management  71 year old female presents from home with concern for anemia.  Patient is receiving home Cubicin and Rocephin for her right hip infection post explant.  She is currently nonweightbearing secondary to this.   Hemoglobin is found to be 7.4 which is slightly higher than reported by patient on outpatient lab draw which I am unable to verify although lower than her prior of 8.8.  Patient is agreeable with transfusion of 1 unit packed red blood cells with plan to follow-up with PCP for further management. I ordered medication including PRBC  for anemia  Reevaluation of the patient after these medicines showed that the patient stayed the same I have reviewed the patients home medicines and have made adjustments as needed   Social Determinants of Health:  Has PCP, currently homebound and non weight bearing secondary to explant of right hip prosthesis    Test / Admission - Considered:  Not anticoagulated, hgb lower than prior without significant symptoms, vitals stable. Plan is for transfuse and dc home for PCP to continue management.          Final Clinical Impression(s) / ED Diagnoses Final diagnoses:  Anemia, unspecified type  B12 deficiency    Rx / DC Orders ED Discharge Orders     None         Tacy Learn, PA-C 27/25/36 6440    Lianne Cure, DO 34/74/25 [phone]

## 2022-05-01 NOTE — Discharge Instructions (Addendum)
Follow up with your doctor for recheck. Return to the ER for worsening or concerning symptoms.  Your kidney function shows an increase in your creatinine today. Recommend recheck and close follow up.

## 2022-05-01 NOTE — ED Triage Notes (Signed)
Patient with right hip infection, hip replacement taken out.  Patient on regimen of IV antibiotics.  Patient with low hemoglobin per bloodwork from home health.  112/72, 54, 18, 99% RA and CBG 128.  Patient has a PICC line right arm.

## 2022-05-02 DIAGNOSIS — R531 Weakness: Secondary | ICD-10-CM | POA: Diagnosis not present

## 2022-05-02 DIAGNOSIS — Z743 Need for continuous supervision: Secondary | ICD-10-CM | POA: Diagnosis not present

## 2022-05-02 DIAGNOSIS — T84038A Mechanical loosening of other internal prosthetic joint, initial encounter: Secondary | ICD-10-CM | POA: Diagnosis not present

## 2022-05-02 DIAGNOSIS — Z7401 Bed confinement status: Secondary | ICD-10-CM | POA: Diagnosis not present

## 2022-05-02 DIAGNOSIS — Z96649 Presence of unspecified artificial hip joint: Secondary | ICD-10-CM | POA: Diagnosis not present

## 2022-05-02 LAB — TYPE AND SCREEN
ABO/RH(D): O POS
Antibody Screen: NEGATIVE
Unit division: 0

## 2022-05-02 LAB — BPAM RBC
Blood Product Expiration Date: 202309272359
ISSUE DATE / TIME: 202308222241
Unit Type and Rh: 5100

## 2022-05-02 MED ORDER — HYDROMORPHONE HCL 2 MG PO TABS
2.0000 mg | ORAL_TABLET | Freq: Once | ORAL | Status: AC
  Administered 2022-05-02: 2 mg via ORAL
  Filled 2022-05-02: qty 1

## 2022-05-02 MED ORDER — HYDROCODONE-ACETAMINOPHEN 5-325 MG PO TABS: 2.0000 | ORAL_TABLET | Freq: Once | ORAL | Status: DC

## 2022-05-02 MED ORDER — OXYCODONE-ACETAMINOPHEN 5-325 MG PO TABS
1.0000 | ORAL_TABLET | Freq: Once | ORAL | Status: AC
  Administered 2022-05-02: 1 via ORAL
  Filled 2022-05-02: qty 1

## 2022-05-02 NOTE — ED Provider Notes (Signed)
  2:00 AM Blood transfusion has finished, tolerated well.  VSS.  Stable for discharge, will follow-up with PCP.  Return here for new concerns.   Larene Pickett, PA-C 05/02/22 2334    Quintella Reichert, MD 05/02/22 [phone]

## 2022-05-03 DIAGNOSIS — A419 Sepsis, unspecified organism: Secondary | ICD-10-CM | POA: Diagnosis not present

## 2022-05-03 DIAGNOSIS — I129 Hypertensive chronic kidney disease with stage 1 through stage 4 chronic kidney disease, or unspecified chronic kidney disease: Secondary | ICD-10-CM | POA: Diagnosis not present

## 2022-05-03 DIAGNOSIS — T8451XA Infection and inflammatory reaction due to internal right hip prosthesis, initial encounter: Secondary | ICD-10-CM | POA: Diagnosis not present

## 2022-05-03 DIAGNOSIS — T84038A Mechanical loosening of other internal prosthetic joint, initial encounter: Secondary | ICD-10-CM | POA: Diagnosis not present

## 2022-05-03 DIAGNOSIS — Z8541 Personal history of malignant neoplasm of cervix uteri: Secondary | ICD-10-CM | POA: Diagnosis not present

## 2022-05-03 DIAGNOSIS — Z87891 Personal history of nicotine dependence: Secondary | ICD-10-CM | POA: Diagnosis not present

## 2022-05-03 DIAGNOSIS — Z452 Encounter for adjustment and management of vascular access device: Secondary | ICD-10-CM | POA: Diagnosis not present

## 2022-05-03 DIAGNOSIS — N184 Chronic kidney disease, stage 4 (severe): Secondary | ICD-10-CM | POA: Diagnosis not present

## 2022-05-03 DIAGNOSIS — Z90710 Acquired absence of both cervix and uterus: Secondary | ICD-10-CM | POA: Diagnosis not present

## 2022-05-03 DIAGNOSIS — N200 Calculus of kidney: Secondary | ICD-10-CM | POA: Diagnosis not present

## 2022-05-03 DIAGNOSIS — T84030D Mechanical loosening of internal right hip prosthetic joint, subsequent encounter: Secondary | ICD-10-CM | POA: Diagnosis not present

## 2022-05-03 DIAGNOSIS — D62 Acute posthemorrhagic anemia: Secondary | ICD-10-CM | POA: Diagnosis not present

## 2022-05-03 DIAGNOSIS — N3040 Irradiation cystitis without hematuria: Secondary | ICD-10-CM | POA: Diagnosis not present

## 2022-05-03 DIAGNOSIS — H2512 Age-related nuclear cataract, left eye: Secondary | ICD-10-CM | POA: Diagnosis not present

## 2022-05-03 DIAGNOSIS — C539 Malignant neoplasm of cervix uteri, unspecified: Secondary | ICD-10-CM | POA: Diagnosis not present

## 2022-05-03 DIAGNOSIS — R32 Unspecified urinary incontinence: Secondary | ICD-10-CM | POA: Diagnosis not present

## 2022-05-03 DIAGNOSIS — M0800 Unspecified juvenile rheumatoid arthritis of unspecified site: Secondary | ICD-10-CM | POA: Diagnosis not present

## 2022-05-03 DIAGNOSIS — Z96642 Presence of left artificial hip joint: Secondary | ICD-10-CM | POA: Diagnosis not present

## 2022-05-03 DIAGNOSIS — N3946 Mixed incontinence: Secondary | ICD-10-CM | POA: Diagnosis not present

## 2022-05-03 DIAGNOSIS — Z96651 Presence of right artificial knee joint: Secondary | ICD-10-CM | POA: Diagnosis not present

## 2022-05-03 DIAGNOSIS — H25812 Combined forms of age-related cataract, left eye: Secondary | ICD-10-CM | POA: Diagnosis not present

## 2022-05-03 DIAGNOSIS — Z7982 Long term (current) use of aspirin: Secondary | ICD-10-CM | POA: Diagnosis not present

## 2022-05-04 DIAGNOSIS — N184 Chronic kidney disease, stage 4 (severe): Secondary | ICD-10-CM | POA: Diagnosis not present

## 2022-05-04 DIAGNOSIS — D62 Acute posthemorrhagic anemia: Secondary | ICD-10-CM | POA: Diagnosis not present

## 2022-05-04 DIAGNOSIS — H2512 Age-related nuclear cataract, left eye: Secondary | ICD-10-CM | POA: Diagnosis not present

## 2022-05-04 DIAGNOSIS — Z96651 Presence of right artificial knee joint: Secondary | ICD-10-CM | POA: Diagnosis not present

## 2022-05-04 DIAGNOSIS — N200 Calculus of kidney: Secondary | ICD-10-CM | POA: Diagnosis not present

## 2022-05-04 DIAGNOSIS — I129 Hypertensive chronic kidney disease with stage 1 through stage 4 chronic kidney disease, or unspecified chronic kidney disease: Secondary | ICD-10-CM | POA: Diagnosis not present

## 2022-05-04 DIAGNOSIS — C539 Malignant neoplasm of cervix uteri, unspecified: Secondary | ICD-10-CM | POA: Diagnosis not present

## 2022-05-04 DIAGNOSIS — M0800 Unspecified juvenile rheumatoid arthritis of unspecified site: Secondary | ICD-10-CM | POA: Diagnosis not present

## 2022-05-04 DIAGNOSIS — T84038A Mechanical loosening of other internal prosthetic joint, initial encounter: Secondary | ICD-10-CM | POA: Diagnosis not present

## 2022-05-04 DIAGNOSIS — N3040 Irradiation cystitis without hematuria: Secondary | ICD-10-CM | POA: Diagnosis not present

## 2022-05-04 DIAGNOSIS — Z7982 Long term (current) use of aspirin: Secondary | ICD-10-CM | POA: Diagnosis not present

## 2022-05-04 DIAGNOSIS — Z96642 Presence of left artificial hip joint: Secondary | ICD-10-CM | POA: Diagnosis not present

## 2022-05-04 DIAGNOSIS — H25812 Combined forms of age-related cataract, left eye: Secondary | ICD-10-CM | POA: Diagnosis not present

## 2022-05-04 DIAGNOSIS — Z452 Encounter for adjustment and management of vascular access device: Secondary | ICD-10-CM | POA: Diagnosis not present

## 2022-05-04 DIAGNOSIS — Z90710 Acquired absence of both cervix and uterus: Secondary | ICD-10-CM | POA: Diagnosis not present

## 2022-05-04 DIAGNOSIS — R32 Unspecified urinary incontinence: Secondary | ICD-10-CM | POA: Diagnosis not present

## 2022-05-04 DIAGNOSIS — A419 Sepsis, unspecified organism: Secondary | ICD-10-CM | POA: Diagnosis not present

## 2022-05-04 DIAGNOSIS — T84030D Mechanical loosening of internal right hip prosthetic joint, subsequent encounter: Secondary | ICD-10-CM | POA: Diagnosis not present

## 2022-05-04 DIAGNOSIS — N3946 Mixed incontinence: Secondary | ICD-10-CM | POA: Diagnosis not present

## 2022-05-04 DIAGNOSIS — Z8541 Personal history of malignant neoplasm of cervix uteri: Secondary | ICD-10-CM | POA: Diagnosis not present

## 2022-05-04 DIAGNOSIS — T8451XA Infection and inflammatory reaction due to internal right hip prosthesis, initial encounter: Secondary | ICD-10-CM | POA: Diagnosis not present

## 2022-05-04 DIAGNOSIS — Z87891 Personal history of nicotine dependence: Secondary | ICD-10-CM | POA: Diagnosis not present

## 2022-05-07 DIAGNOSIS — R32 Unspecified urinary incontinence: Secondary | ICD-10-CM | POA: Diagnosis not present

## 2022-05-07 DIAGNOSIS — Z87891 Personal history of nicotine dependence: Secondary | ICD-10-CM | POA: Diagnosis not present

## 2022-05-07 DIAGNOSIS — A419 Sepsis, unspecified organism: Secondary | ICD-10-CM | POA: Diagnosis not present

## 2022-05-07 DIAGNOSIS — N3946 Mixed incontinence: Secondary | ICD-10-CM | POA: Diagnosis not present

## 2022-05-07 DIAGNOSIS — T84030D Mechanical loosening of internal right hip prosthetic joint, subsequent encounter: Secondary | ICD-10-CM | POA: Diagnosis not present

## 2022-05-07 DIAGNOSIS — T8451XA Infection and inflammatory reaction due to internal right hip prosthesis, initial encounter: Secondary | ICD-10-CM | POA: Diagnosis not present

## 2022-05-07 DIAGNOSIS — Z8541 Personal history of malignant neoplasm of cervix uteri: Secondary | ICD-10-CM | POA: Diagnosis not present

## 2022-05-07 DIAGNOSIS — M0800 Unspecified juvenile rheumatoid arthritis of unspecified site: Secondary | ICD-10-CM | POA: Diagnosis not present

## 2022-05-07 DIAGNOSIS — Z96651 Presence of right artificial knee joint: Secondary | ICD-10-CM | POA: Diagnosis not present

## 2022-05-07 DIAGNOSIS — N184 Chronic kidney disease, stage 4 (severe): Secondary | ICD-10-CM | POA: Diagnosis not present

## 2022-05-07 DIAGNOSIS — T84038A Mechanical loosening of other internal prosthetic joint, initial encounter: Secondary | ICD-10-CM | POA: Diagnosis not present

## 2022-05-07 DIAGNOSIS — Z7982 Long term (current) use of aspirin: Secondary | ICD-10-CM | POA: Diagnosis not present

## 2022-05-07 DIAGNOSIS — N200 Calculus of kidney: Secondary | ICD-10-CM | POA: Diagnosis not present

## 2022-05-07 DIAGNOSIS — H25812 Combined forms of age-related cataract, left eye: Secondary | ICD-10-CM | POA: Diagnosis not present

## 2022-05-07 DIAGNOSIS — D62 Acute posthemorrhagic anemia: Secondary | ICD-10-CM | POA: Diagnosis not present

## 2022-05-07 DIAGNOSIS — N3040 Irradiation cystitis without hematuria: Secondary | ICD-10-CM | POA: Diagnosis not present

## 2022-05-07 DIAGNOSIS — Z90710 Acquired absence of both cervix and uterus: Secondary | ICD-10-CM | POA: Diagnosis not present

## 2022-05-07 DIAGNOSIS — Z452 Encounter for adjustment and management of vascular access device: Secondary | ICD-10-CM | POA: Diagnosis not present

## 2022-05-07 DIAGNOSIS — Z96642 Presence of left artificial hip joint: Secondary | ICD-10-CM | POA: Diagnosis not present

## 2022-05-07 DIAGNOSIS — C539 Malignant neoplasm of cervix uteri, unspecified: Secondary | ICD-10-CM | POA: Diagnosis not present

## 2022-05-07 DIAGNOSIS — I129 Hypertensive chronic kidney disease with stage 1 through stage 4 chronic kidney disease, or unspecified chronic kidney disease: Secondary | ICD-10-CM | POA: Diagnosis not present

## 2022-05-07 DIAGNOSIS — H2512 Age-related nuclear cataract, left eye: Secondary | ICD-10-CM | POA: Diagnosis not present

## 2022-05-08 DIAGNOSIS — Z452 Encounter for adjustment and management of vascular access device: Secondary | ICD-10-CM | POA: Diagnosis not present

## 2022-05-08 DIAGNOSIS — N200 Calculus of kidney: Secondary | ICD-10-CM | POA: Diagnosis not present

## 2022-05-08 DIAGNOSIS — A419 Sepsis, unspecified organism: Secondary | ICD-10-CM | POA: Diagnosis not present

## 2022-05-08 DIAGNOSIS — Z7982 Long term (current) use of aspirin: Secondary | ICD-10-CM | POA: Diagnosis not present

## 2022-05-08 DIAGNOSIS — M0800 Unspecified juvenile rheumatoid arthritis of unspecified site: Secondary | ICD-10-CM | POA: Diagnosis not present

## 2022-05-08 DIAGNOSIS — I129 Hypertensive chronic kidney disease with stage 1 through stage 4 chronic kidney disease, or unspecified chronic kidney disease: Secondary | ICD-10-CM | POA: Diagnosis not present

## 2022-05-08 DIAGNOSIS — R32 Unspecified urinary incontinence: Secondary | ICD-10-CM | POA: Diagnosis not present

## 2022-05-08 DIAGNOSIS — N3040 Irradiation cystitis without hematuria: Secondary | ICD-10-CM | POA: Diagnosis not present

## 2022-05-08 DIAGNOSIS — Z90710 Acquired absence of both cervix and uterus: Secondary | ICD-10-CM | POA: Diagnosis not present

## 2022-05-08 DIAGNOSIS — N3946 Mixed incontinence: Secondary | ICD-10-CM | POA: Diagnosis not present

## 2022-05-08 DIAGNOSIS — N184 Chronic kidney disease, stage 4 (severe): Secondary | ICD-10-CM | POA: Diagnosis not present

## 2022-05-08 DIAGNOSIS — H25812 Combined forms of age-related cataract, left eye: Secondary | ICD-10-CM | POA: Diagnosis not present

## 2022-05-08 DIAGNOSIS — T84038A Mechanical loosening of other internal prosthetic joint, initial encounter: Secondary | ICD-10-CM | POA: Diagnosis not present

## 2022-05-08 DIAGNOSIS — T84030D Mechanical loosening of internal right hip prosthetic joint, subsequent encounter: Secondary | ICD-10-CM | POA: Diagnosis not present

## 2022-05-08 DIAGNOSIS — Z96651 Presence of right artificial knee joint: Secondary | ICD-10-CM | POA: Diagnosis not present

## 2022-05-08 DIAGNOSIS — T8451XA Infection and inflammatory reaction due to internal right hip prosthesis, initial encounter: Secondary | ICD-10-CM | POA: Diagnosis not present

## 2022-05-08 DIAGNOSIS — H2512 Age-related nuclear cataract, left eye: Secondary | ICD-10-CM | POA: Diagnosis not present

## 2022-05-08 DIAGNOSIS — Z8541 Personal history of malignant neoplasm of cervix uteri: Secondary | ICD-10-CM | POA: Diagnosis not present

## 2022-05-08 DIAGNOSIS — C539 Malignant neoplasm of cervix uteri, unspecified: Secondary | ICD-10-CM | POA: Diagnosis not present

## 2022-05-08 DIAGNOSIS — Z87891 Personal history of nicotine dependence: Secondary | ICD-10-CM | POA: Diagnosis not present

## 2022-05-08 DIAGNOSIS — Z96642 Presence of left artificial hip joint: Secondary | ICD-10-CM | POA: Diagnosis not present

## 2022-05-08 DIAGNOSIS — D62 Acute posthemorrhagic anemia: Secondary | ICD-10-CM | POA: Diagnosis not present

## 2022-05-09 DIAGNOSIS — T84038A Mechanical loosening of other internal prosthetic joint, initial encounter: Secondary | ICD-10-CM | POA: Diagnosis not present

## 2022-05-09 DIAGNOSIS — Z96649 Presence of unspecified artificial hip joint: Secondary | ICD-10-CM | POA: Diagnosis not present

## 2022-05-10 DIAGNOSIS — N200 Calculus of kidney: Secondary | ICD-10-CM | POA: Diagnosis not present

## 2022-05-10 DIAGNOSIS — R32 Unspecified urinary incontinence: Secondary | ICD-10-CM | POA: Diagnosis not present

## 2022-05-10 DIAGNOSIS — T84038A Mechanical loosening of other internal prosthetic joint, initial encounter: Secondary | ICD-10-CM | POA: Diagnosis not present

## 2022-05-10 DIAGNOSIS — I129 Hypertensive chronic kidney disease with stage 1 through stage 4 chronic kidney disease, or unspecified chronic kidney disease: Secondary | ICD-10-CM | POA: Diagnosis not present

## 2022-05-10 DIAGNOSIS — Z452 Encounter for adjustment and management of vascular access device: Secondary | ICD-10-CM | POA: Diagnosis not present

## 2022-05-10 DIAGNOSIS — T84030D Mechanical loosening of internal right hip prosthetic joint, subsequent encounter: Secondary | ICD-10-CM | POA: Diagnosis not present

## 2022-05-10 DIAGNOSIS — Z87891 Personal history of nicotine dependence: Secondary | ICD-10-CM | POA: Diagnosis not present

## 2022-05-10 DIAGNOSIS — N3040 Irradiation cystitis without hematuria: Secondary | ICD-10-CM | POA: Diagnosis not present

## 2022-05-10 DIAGNOSIS — H25812 Combined forms of age-related cataract, left eye: Secondary | ICD-10-CM | POA: Diagnosis not present

## 2022-05-10 DIAGNOSIS — H2512 Age-related nuclear cataract, left eye: Secondary | ICD-10-CM | POA: Diagnosis not present

## 2022-05-10 DIAGNOSIS — Z96642 Presence of left artificial hip joint: Secondary | ICD-10-CM | POA: Diagnosis not present

## 2022-05-10 DIAGNOSIS — Z8541 Personal history of malignant neoplasm of cervix uteri: Secondary | ICD-10-CM | POA: Diagnosis not present

## 2022-05-10 DIAGNOSIS — Z90710 Acquired absence of both cervix and uterus: Secondary | ICD-10-CM | POA: Diagnosis not present

## 2022-05-10 DIAGNOSIS — T8451XA Infection and inflammatory reaction due to internal right hip prosthesis, initial encounter: Secondary | ICD-10-CM | POA: Diagnosis not present

## 2022-05-10 DIAGNOSIS — N184 Chronic kidney disease, stage 4 (severe): Secondary | ICD-10-CM | POA: Diagnosis not present

## 2022-05-10 DIAGNOSIS — A419 Sepsis, unspecified organism: Secondary | ICD-10-CM | POA: Diagnosis not present

## 2022-05-10 DIAGNOSIS — N3946 Mixed incontinence: Secondary | ICD-10-CM | POA: Diagnosis not present

## 2022-05-10 DIAGNOSIS — Z96651 Presence of right artificial knee joint: Secondary | ICD-10-CM | POA: Diagnosis not present

## 2022-05-10 DIAGNOSIS — Z7982 Long term (current) use of aspirin: Secondary | ICD-10-CM | POA: Diagnosis not present

## 2022-05-10 DIAGNOSIS — M0800 Unspecified juvenile rheumatoid arthritis of unspecified site: Secondary | ICD-10-CM | POA: Diagnosis not present

## 2022-05-10 DIAGNOSIS — D62 Acute posthemorrhagic anemia: Secondary | ICD-10-CM | POA: Diagnosis not present

## 2022-05-10 DIAGNOSIS — C539 Malignant neoplasm of cervix uteri, unspecified: Secondary | ICD-10-CM | POA: Diagnosis not present

## 2022-05-11 DIAGNOSIS — T8459XD Infection and inflammatory reaction due to other internal joint prosthesis, subsequent encounter: Secondary | ICD-10-CM | POA: Diagnosis not present

## 2022-05-11 DIAGNOSIS — C539 Malignant neoplasm of cervix uteri, unspecified: Secondary | ICD-10-CM | POA: Diagnosis not present

## 2022-05-11 DIAGNOSIS — H2512 Age-related nuclear cataract, left eye: Secondary | ICD-10-CM | POA: Diagnosis not present

## 2022-05-11 DIAGNOSIS — N200 Calculus of kidney: Secondary | ICD-10-CM | POA: Diagnosis not present

## 2022-05-11 DIAGNOSIS — H25812 Combined forms of age-related cataract, left eye: Secondary | ICD-10-CM | POA: Diagnosis not present

## 2022-05-11 DIAGNOSIS — A419 Sepsis, unspecified organism: Secondary | ICD-10-CM | POA: Diagnosis not present

## 2022-05-11 DIAGNOSIS — T84030D Mechanical loosening of internal right hip prosthetic joint, subsequent encounter: Secondary | ICD-10-CM | POA: Diagnosis not present

## 2022-05-11 DIAGNOSIS — Z7982 Long term (current) use of aspirin: Secondary | ICD-10-CM | POA: Diagnosis not present

## 2022-05-11 DIAGNOSIS — Z90710 Acquired absence of both cervix and uterus: Secondary | ICD-10-CM | POA: Diagnosis not present

## 2022-05-11 DIAGNOSIS — T8451XA Infection and inflammatory reaction due to internal right hip prosthesis, initial encounter: Secondary | ICD-10-CM | POA: Diagnosis not present

## 2022-05-11 DIAGNOSIS — M0800 Unspecified juvenile rheumatoid arthritis of unspecified site: Secondary | ICD-10-CM | POA: Diagnosis not present

## 2022-05-11 DIAGNOSIS — Z87891 Personal history of nicotine dependence: Secondary | ICD-10-CM | POA: Diagnosis not present

## 2022-05-11 DIAGNOSIS — Z96642 Presence of left artificial hip joint: Secondary | ICD-10-CM | POA: Diagnosis not present

## 2022-05-11 DIAGNOSIS — N184 Chronic kidney disease, stage 4 (severe): Secondary | ICD-10-CM | POA: Diagnosis not present

## 2022-05-11 DIAGNOSIS — T84038A Mechanical loosening of other internal prosthetic joint, initial encounter: Secondary | ICD-10-CM | POA: Diagnosis not present

## 2022-05-11 DIAGNOSIS — N3946 Mixed incontinence: Secondary | ICD-10-CM | POA: Diagnosis not present

## 2022-05-11 DIAGNOSIS — I129 Hypertensive chronic kidney disease with stage 1 through stage 4 chronic kidney disease, or unspecified chronic kidney disease: Secondary | ICD-10-CM | POA: Diagnosis not present

## 2022-05-11 DIAGNOSIS — Z452 Encounter for adjustment and management of vascular access device: Secondary | ICD-10-CM | POA: Diagnosis not present

## 2022-05-11 DIAGNOSIS — G629 Polyneuropathy, unspecified: Secondary | ICD-10-CM | POA: Diagnosis not present

## 2022-05-11 DIAGNOSIS — Z96651 Presence of right artificial knee joint: Secondary | ICD-10-CM | POA: Diagnosis not present

## 2022-05-11 DIAGNOSIS — N3040 Irradiation cystitis without hematuria: Secondary | ICD-10-CM | POA: Diagnosis not present

## 2022-05-11 DIAGNOSIS — Z8541 Personal history of malignant neoplasm of cervix uteri: Secondary | ICD-10-CM | POA: Diagnosis not present

## 2022-05-11 DIAGNOSIS — Z792 Long term (current) use of antibiotics: Secondary | ICD-10-CM | POA: Diagnosis not present

## 2022-05-11 DIAGNOSIS — R32 Unspecified urinary incontinence: Secondary | ICD-10-CM | POA: Diagnosis not present

## 2022-05-11 DIAGNOSIS — M05751 Rheumatoid arthritis with rheumatoid factor of right hip without organ or systems involvement: Secondary | ICD-10-CM | POA: Diagnosis not present

## 2022-05-11 DIAGNOSIS — M05752 Rheumatoid arthritis with rheumatoid factor of left hip without organ or systems involvement: Secondary | ICD-10-CM | POA: Diagnosis not present

## 2022-05-11 DIAGNOSIS — B957 Other staphylococcus as the cause of diseases classified elsewhere: Secondary | ICD-10-CM | POA: Diagnosis not present

## 2022-05-11 DIAGNOSIS — Z96649 Presence of unspecified artificial hip joint: Secondary | ICD-10-CM | POA: Diagnosis not present

## 2022-05-11 DIAGNOSIS — D62 Acute posthemorrhagic anemia: Secondary | ICD-10-CM | POA: Diagnosis not present

## 2022-05-14 DIAGNOSIS — Z7982 Long term (current) use of aspirin: Secondary | ICD-10-CM | POA: Diagnosis not present

## 2022-05-14 DIAGNOSIS — Z96651 Presence of right artificial knee joint: Secondary | ICD-10-CM | POA: Diagnosis not present

## 2022-05-14 DIAGNOSIS — Z90710 Acquired absence of both cervix and uterus: Secondary | ICD-10-CM | POA: Diagnosis not present

## 2022-05-14 DIAGNOSIS — N3946 Mixed incontinence: Secondary | ICD-10-CM | POA: Diagnosis not present

## 2022-05-14 DIAGNOSIS — A419 Sepsis, unspecified organism: Secondary | ICD-10-CM | POA: Diagnosis not present

## 2022-05-14 DIAGNOSIS — T84030D Mechanical loosening of internal right hip prosthetic joint, subsequent encounter: Secondary | ICD-10-CM | POA: Diagnosis not present

## 2022-05-14 DIAGNOSIS — N184 Chronic kidney disease, stage 4 (severe): Secondary | ICD-10-CM | POA: Diagnosis not present

## 2022-05-14 DIAGNOSIS — Z8541 Personal history of malignant neoplasm of cervix uteri: Secondary | ICD-10-CM | POA: Diagnosis not present

## 2022-05-14 DIAGNOSIS — D62 Acute posthemorrhagic anemia: Secondary | ICD-10-CM | POA: Diagnosis not present

## 2022-05-14 DIAGNOSIS — R32 Unspecified urinary incontinence: Secondary | ICD-10-CM | POA: Diagnosis not present

## 2022-05-14 DIAGNOSIS — T84038A Mechanical loosening of other internal prosthetic joint, initial encounter: Secondary | ICD-10-CM | POA: Diagnosis not present

## 2022-05-14 DIAGNOSIS — T8451XA Infection and inflammatory reaction due to internal right hip prosthesis, initial encounter: Secondary | ICD-10-CM | POA: Diagnosis not present

## 2022-05-14 DIAGNOSIS — M0800 Unspecified juvenile rheumatoid arthritis of unspecified site: Secondary | ICD-10-CM | POA: Diagnosis not present

## 2022-05-14 DIAGNOSIS — H25812 Combined forms of age-related cataract, left eye: Secondary | ICD-10-CM | POA: Diagnosis not present

## 2022-05-14 DIAGNOSIS — Z87891 Personal history of nicotine dependence: Secondary | ICD-10-CM | POA: Diagnosis not present

## 2022-05-14 DIAGNOSIS — N200 Calculus of kidney: Secondary | ICD-10-CM | POA: Diagnosis not present

## 2022-05-14 DIAGNOSIS — H2512 Age-related nuclear cataract, left eye: Secondary | ICD-10-CM | POA: Diagnosis not present

## 2022-05-14 DIAGNOSIS — I129 Hypertensive chronic kidney disease with stage 1 through stage 4 chronic kidney disease, or unspecified chronic kidney disease: Secondary | ICD-10-CM | POA: Diagnosis not present

## 2022-05-14 DIAGNOSIS — N3040 Irradiation cystitis without hematuria: Secondary | ICD-10-CM | POA: Diagnosis not present

## 2022-05-14 DIAGNOSIS — C539 Malignant neoplasm of cervix uteri, unspecified: Secondary | ICD-10-CM | POA: Diagnosis not present

## 2022-05-14 DIAGNOSIS — Z452 Encounter for adjustment and management of vascular access device: Secondary | ICD-10-CM | POA: Diagnosis not present

## 2022-05-14 DIAGNOSIS — Z96642 Presence of left artificial hip joint: Secondary | ICD-10-CM | POA: Diagnosis not present

## 2022-05-15 DIAGNOSIS — T8451XA Infection and inflammatory reaction due to internal right hip prosthesis, initial encounter: Secondary | ICD-10-CM | POA: Diagnosis not present

## 2022-05-15 DIAGNOSIS — Z96642 Presence of left artificial hip joint: Secondary | ICD-10-CM | POA: Diagnosis not present

## 2022-05-15 DIAGNOSIS — D62 Acute posthemorrhagic anemia: Secondary | ICD-10-CM | POA: Diagnosis not present

## 2022-05-15 DIAGNOSIS — N3946 Mixed incontinence: Secondary | ICD-10-CM | POA: Diagnosis not present

## 2022-05-15 DIAGNOSIS — Z8541 Personal history of malignant neoplasm of cervix uteri: Secondary | ICD-10-CM | POA: Diagnosis not present

## 2022-05-15 DIAGNOSIS — R32 Unspecified urinary incontinence: Secondary | ICD-10-CM | POA: Diagnosis not present

## 2022-05-15 DIAGNOSIS — N200 Calculus of kidney: Secondary | ICD-10-CM | POA: Diagnosis not present

## 2022-05-15 DIAGNOSIS — Z87891 Personal history of nicotine dependence: Secondary | ICD-10-CM | POA: Diagnosis not present

## 2022-05-15 DIAGNOSIS — H25812 Combined forms of age-related cataract, left eye: Secondary | ICD-10-CM | POA: Diagnosis not present

## 2022-05-15 DIAGNOSIS — N184 Chronic kidney disease, stage 4 (severe): Secondary | ICD-10-CM | POA: Diagnosis not present

## 2022-05-15 DIAGNOSIS — M0800 Unspecified juvenile rheumatoid arthritis of unspecified site: Secondary | ICD-10-CM | POA: Diagnosis not present

## 2022-05-15 DIAGNOSIS — Z452 Encounter for adjustment and management of vascular access device: Secondary | ICD-10-CM | POA: Diagnosis not present

## 2022-05-15 DIAGNOSIS — T84038A Mechanical loosening of other internal prosthetic joint, initial encounter: Secondary | ICD-10-CM | POA: Diagnosis not present

## 2022-05-15 DIAGNOSIS — C539 Malignant neoplasm of cervix uteri, unspecified: Secondary | ICD-10-CM | POA: Diagnosis not present

## 2022-05-15 DIAGNOSIS — H2512 Age-related nuclear cataract, left eye: Secondary | ICD-10-CM | POA: Diagnosis not present

## 2022-05-15 DIAGNOSIS — N3040 Irradiation cystitis without hematuria: Secondary | ICD-10-CM | POA: Diagnosis not present

## 2022-05-15 DIAGNOSIS — Z90710 Acquired absence of both cervix and uterus: Secondary | ICD-10-CM | POA: Diagnosis not present

## 2022-05-15 DIAGNOSIS — Z96651 Presence of right artificial knee joint: Secondary | ICD-10-CM | POA: Diagnosis not present

## 2022-05-15 DIAGNOSIS — I129 Hypertensive chronic kidney disease with stage 1 through stage 4 chronic kidney disease, or unspecified chronic kidney disease: Secondary | ICD-10-CM | POA: Diagnosis not present

## 2022-05-15 DIAGNOSIS — T84030D Mechanical loosening of internal right hip prosthetic joint, subsequent encounter: Secondary | ICD-10-CM | POA: Diagnosis not present

## 2022-05-15 DIAGNOSIS — A419 Sepsis, unspecified organism: Secondary | ICD-10-CM | POA: Diagnosis not present

## 2022-05-15 DIAGNOSIS — Z7982 Long term (current) use of aspirin: Secondary | ICD-10-CM | POA: Diagnosis not present

## 2022-05-16 DIAGNOSIS — T84038A Mechanical loosening of other internal prosthetic joint, initial encounter: Secondary | ICD-10-CM | POA: Diagnosis not present

## 2022-05-16 DIAGNOSIS — Z96649 Presence of unspecified artificial hip joint: Secondary | ICD-10-CM | POA: Diagnosis not present

## 2022-05-17 DIAGNOSIS — Z96642 Presence of left artificial hip joint: Secondary | ICD-10-CM | POA: Diagnosis not present

## 2022-05-17 DIAGNOSIS — C539 Malignant neoplasm of cervix uteri, unspecified: Secondary | ICD-10-CM | POA: Diagnosis not present

## 2022-05-17 DIAGNOSIS — N3040 Irradiation cystitis without hematuria: Secondary | ICD-10-CM | POA: Diagnosis not present

## 2022-05-17 DIAGNOSIS — T8451XA Infection and inflammatory reaction due to internal right hip prosthesis, initial encounter: Secondary | ICD-10-CM | POA: Diagnosis not present

## 2022-05-17 DIAGNOSIS — Z87891 Personal history of nicotine dependence: Secondary | ICD-10-CM | POA: Diagnosis not present

## 2022-05-17 DIAGNOSIS — Z90710 Acquired absence of both cervix and uterus: Secondary | ICD-10-CM | POA: Diagnosis not present

## 2022-05-17 DIAGNOSIS — Z452 Encounter for adjustment and management of vascular access device: Secondary | ICD-10-CM | POA: Diagnosis not present

## 2022-05-17 DIAGNOSIS — N3946 Mixed incontinence: Secondary | ICD-10-CM | POA: Diagnosis not present

## 2022-05-17 DIAGNOSIS — R32 Unspecified urinary incontinence: Secondary | ICD-10-CM | POA: Diagnosis not present

## 2022-05-17 DIAGNOSIS — A419 Sepsis, unspecified organism: Secondary | ICD-10-CM | POA: Diagnosis not present

## 2022-05-17 DIAGNOSIS — D62 Acute posthemorrhagic anemia: Secondary | ICD-10-CM | POA: Diagnosis not present

## 2022-05-17 DIAGNOSIS — I129 Hypertensive chronic kidney disease with stage 1 through stage 4 chronic kidney disease, or unspecified chronic kidney disease: Secondary | ICD-10-CM | POA: Diagnosis not present

## 2022-05-17 DIAGNOSIS — T84038A Mechanical loosening of other internal prosthetic joint, initial encounter: Secondary | ICD-10-CM | POA: Diagnosis not present

## 2022-05-17 DIAGNOSIS — H25812 Combined forms of age-related cataract, left eye: Secondary | ICD-10-CM | POA: Diagnosis not present

## 2022-05-17 DIAGNOSIS — N184 Chronic kidney disease, stage 4 (severe): Secondary | ICD-10-CM | POA: Diagnosis not present

## 2022-05-17 DIAGNOSIS — N200 Calculus of kidney: Secondary | ICD-10-CM | POA: Diagnosis not present

## 2022-05-17 DIAGNOSIS — M0800 Unspecified juvenile rheumatoid arthritis of unspecified site: Secondary | ICD-10-CM | POA: Diagnosis not present

## 2022-05-17 DIAGNOSIS — Z7982 Long term (current) use of aspirin: Secondary | ICD-10-CM | POA: Diagnosis not present

## 2022-05-17 DIAGNOSIS — Z96651 Presence of right artificial knee joint: Secondary | ICD-10-CM | POA: Diagnosis not present

## 2022-05-17 DIAGNOSIS — H2512 Age-related nuclear cataract, left eye: Secondary | ICD-10-CM | POA: Diagnosis not present

## 2022-05-17 DIAGNOSIS — T84030D Mechanical loosening of internal right hip prosthetic joint, subsequent encounter: Secondary | ICD-10-CM | POA: Diagnosis not present

## 2022-05-17 DIAGNOSIS — Z8541 Personal history of malignant neoplasm of cervix uteri: Secondary | ICD-10-CM | POA: Diagnosis not present

## 2022-05-21 DIAGNOSIS — H25812 Combined forms of age-related cataract, left eye: Secondary | ICD-10-CM | POA: Diagnosis not present

## 2022-05-21 DIAGNOSIS — Z452 Encounter for adjustment and management of vascular access device: Secondary | ICD-10-CM | POA: Diagnosis not present

## 2022-05-21 DIAGNOSIS — I129 Hypertensive chronic kidney disease with stage 1 through stage 4 chronic kidney disease, or unspecified chronic kidney disease: Secondary | ICD-10-CM | POA: Diagnosis not present

## 2022-05-21 DIAGNOSIS — D62 Acute posthemorrhagic anemia: Secondary | ICD-10-CM | POA: Diagnosis not present

## 2022-05-21 DIAGNOSIS — C539 Malignant neoplasm of cervix uteri, unspecified: Secondary | ICD-10-CM | POA: Diagnosis not present

## 2022-05-21 DIAGNOSIS — Z96651 Presence of right artificial knee joint: Secondary | ICD-10-CM | POA: Diagnosis not present

## 2022-05-21 DIAGNOSIS — N3946 Mixed incontinence: Secondary | ICD-10-CM | POA: Diagnosis not present

## 2022-05-21 DIAGNOSIS — N3040 Irradiation cystitis without hematuria: Secondary | ICD-10-CM | POA: Diagnosis not present

## 2022-05-21 DIAGNOSIS — T84030D Mechanical loosening of internal right hip prosthetic joint, subsequent encounter: Secondary | ICD-10-CM | POA: Diagnosis not present

## 2022-05-21 DIAGNOSIS — Z7982 Long term (current) use of aspirin: Secondary | ICD-10-CM | POA: Diagnosis not present

## 2022-05-21 DIAGNOSIS — A419 Sepsis, unspecified organism: Secondary | ICD-10-CM | POA: Diagnosis not present

## 2022-05-21 DIAGNOSIS — H2512 Age-related nuclear cataract, left eye: Secondary | ICD-10-CM | POA: Diagnosis not present

## 2022-05-21 DIAGNOSIS — T8451XA Infection and inflammatory reaction due to internal right hip prosthesis, initial encounter: Secondary | ICD-10-CM | POA: Diagnosis not present

## 2022-05-21 DIAGNOSIS — Z96642 Presence of left artificial hip joint: Secondary | ICD-10-CM | POA: Diagnosis not present

## 2022-05-21 DIAGNOSIS — N200 Calculus of kidney: Secondary | ICD-10-CM | POA: Diagnosis not present

## 2022-05-21 DIAGNOSIS — R32 Unspecified urinary incontinence: Secondary | ICD-10-CM | POA: Diagnosis not present

## 2022-05-21 DIAGNOSIS — N184 Chronic kidney disease, stage 4 (severe): Secondary | ICD-10-CM | POA: Diagnosis not present

## 2022-05-21 DIAGNOSIS — T84038A Mechanical loosening of other internal prosthetic joint, initial encounter: Secondary | ICD-10-CM | POA: Diagnosis not present

## 2022-05-21 DIAGNOSIS — Z90710 Acquired absence of both cervix and uterus: Secondary | ICD-10-CM | POA: Diagnosis not present

## 2022-05-21 DIAGNOSIS — Z8541 Personal history of malignant neoplasm of cervix uteri: Secondary | ICD-10-CM | POA: Diagnosis not present

## 2022-05-21 DIAGNOSIS — M0800 Unspecified juvenile rheumatoid arthritis of unspecified site: Secondary | ICD-10-CM | POA: Diagnosis not present

## 2022-05-21 DIAGNOSIS — Z87891 Personal history of nicotine dependence: Secondary | ICD-10-CM | POA: Diagnosis not present

## 2022-05-22 DIAGNOSIS — Z96649 Presence of unspecified artificial hip joint: Secondary | ICD-10-CM | POA: Diagnosis not present

## 2022-05-23 DIAGNOSIS — N3040 Irradiation cystitis without hematuria: Secondary | ICD-10-CM | POA: Diagnosis not present

## 2022-05-23 DIAGNOSIS — H2512 Age-related nuclear cataract, left eye: Secondary | ICD-10-CM | POA: Diagnosis not present

## 2022-05-23 DIAGNOSIS — A419 Sepsis, unspecified organism: Secondary | ICD-10-CM | POA: Diagnosis not present

## 2022-05-23 DIAGNOSIS — T84030D Mechanical loosening of internal right hip prosthetic joint, subsequent encounter: Secondary | ICD-10-CM | POA: Diagnosis not present

## 2022-05-23 DIAGNOSIS — Z90710 Acquired absence of both cervix and uterus: Secondary | ICD-10-CM | POA: Diagnosis not present

## 2022-05-23 DIAGNOSIS — T8451XA Infection and inflammatory reaction due to internal right hip prosthesis, initial encounter: Secondary | ICD-10-CM | POA: Diagnosis not present

## 2022-05-23 DIAGNOSIS — Z8541 Personal history of malignant neoplasm of cervix uteri: Secondary | ICD-10-CM | POA: Diagnosis not present

## 2022-05-23 DIAGNOSIS — D62 Acute posthemorrhagic anemia: Secondary | ICD-10-CM | POA: Diagnosis not present

## 2022-05-23 DIAGNOSIS — H25812 Combined forms of age-related cataract, left eye: Secondary | ICD-10-CM | POA: Diagnosis not present

## 2022-05-23 DIAGNOSIS — Z87891 Personal history of nicotine dependence: Secondary | ICD-10-CM | POA: Diagnosis not present

## 2022-05-23 DIAGNOSIS — Z96642 Presence of left artificial hip joint: Secondary | ICD-10-CM | POA: Diagnosis not present

## 2022-05-23 DIAGNOSIS — N184 Chronic kidney disease, stage 4 (severe): Secondary | ICD-10-CM | POA: Diagnosis not present

## 2022-05-23 DIAGNOSIS — N3946 Mixed incontinence: Secondary | ICD-10-CM | POA: Diagnosis not present

## 2022-05-23 DIAGNOSIS — I129 Hypertensive chronic kidney disease with stage 1 through stage 4 chronic kidney disease, or unspecified chronic kidney disease: Secondary | ICD-10-CM | POA: Diagnosis not present

## 2022-05-23 DIAGNOSIS — Z96651 Presence of right artificial knee joint: Secondary | ICD-10-CM | POA: Diagnosis not present

## 2022-05-23 DIAGNOSIS — R32 Unspecified urinary incontinence: Secondary | ICD-10-CM | POA: Diagnosis not present

## 2022-05-23 DIAGNOSIS — C539 Malignant neoplasm of cervix uteri, unspecified: Secondary | ICD-10-CM | POA: Diagnosis not present

## 2022-05-23 DIAGNOSIS — T84038A Mechanical loosening of other internal prosthetic joint, initial encounter: Secondary | ICD-10-CM | POA: Diagnosis not present

## 2022-05-23 DIAGNOSIS — M0800 Unspecified juvenile rheumatoid arthritis of unspecified site: Secondary | ICD-10-CM | POA: Diagnosis not present

## 2022-05-23 DIAGNOSIS — Z452 Encounter for adjustment and management of vascular access device: Secondary | ICD-10-CM | POA: Diagnosis not present

## 2022-05-23 DIAGNOSIS — Z7982 Long term (current) use of aspirin: Secondary | ICD-10-CM | POA: Diagnosis not present

## 2022-05-23 DIAGNOSIS — N200 Calculus of kidney: Secondary | ICD-10-CM | POA: Diagnosis not present

## 2022-05-24 DIAGNOSIS — H25812 Combined forms of age-related cataract, left eye: Secondary | ICD-10-CM | POA: Diagnosis not present

## 2022-05-24 DIAGNOSIS — Z96651 Presence of right artificial knee joint: Secondary | ICD-10-CM | POA: Diagnosis not present

## 2022-05-24 DIAGNOSIS — D62 Acute posthemorrhagic anemia: Secondary | ICD-10-CM | POA: Diagnosis not present

## 2022-05-24 DIAGNOSIS — Z96642 Presence of left artificial hip joint: Secondary | ICD-10-CM | POA: Diagnosis not present

## 2022-05-24 DIAGNOSIS — Z87891 Personal history of nicotine dependence: Secondary | ICD-10-CM | POA: Diagnosis not present

## 2022-05-24 DIAGNOSIS — N200 Calculus of kidney: Secondary | ICD-10-CM | POA: Diagnosis not present

## 2022-05-24 DIAGNOSIS — R32 Unspecified urinary incontinence: Secondary | ICD-10-CM | POA: Diagnosis not present

## 2022-05-24 DIAGNOSIS — T84030D Mechanical loosening of internal right hip prosthetic joint, subsequent encounter: Secondary | ICD-10-CM | POA: Diagnosis not present

## 2022-05-24 DIAGNOSIS — T8451XA Infection and inflammatory reaction due to internal right hip prosthesis, initial encounter: Secondary | ICD-10-CM | POA: Diagnosis not present

## 2022-05-24 DIAGNOSIS — N3946 Mixed incontinence: Secondary | ICD-10-CM | POA: Diagnosis not present

## 2022-05-24 DIAGNOSIS — Z452 Encounter for adjustment and management of vascular access device: Secondary | ICD-10-CM | POA: Diagnosis not present

## 2022-05-24 DIAGNOSIS — Z7982 Long term (current) use of aspirin: Secondary | ICD-10-CM | POA: Diagnosis not present

## 2022-05-24 DIAGNOSIS — H2512 Age-related nuclear cataract, left eye: Secondary | ICD-10-CM | POA: Diagnosis not present

## 2022-05-24 DIAGNOSIS — N184 Chronic kidney disease, stage 4 (severe): Secondary | ICD-10-CM | POA: Diagnosis not present

## 2022-05-24 DIAGNOSIS — T84038A Mechanical loosening of other internal prosthetic joint, initial encounter: Secondary | ICD-10-CM | POA: Diagnosis not present

## 2022-05-24 DIAGNOSIS — N3040 Irradiation cystitis without hematuria: Secondary | ICD-10-CM | POA: Diagnosis not present

## 2022-05-24 DIAGNOSIS — I129 Hypertensive chronic kidney disease with stage 1 through stage 4 chronic kidney disease, or unspecified chronic kidney disease: Secondary | ICD-10-CM | POA: Diagnosis not present

## 2022-05-24 DIAGNOSIS — M0800 Unspecified juvenile rheumatoid arthritis of unspecified site: Secondary | ICD-10-CM | POA: Diagnosis not present

## 2022-05-24 DIAGNOSIS — A419 Sepsis, unspecified organism: Secondary | ICD-10-CM | POA: Diagnosis not present

## 2022-05-24 DIAGNOSIS — Z8541 Personal history of malignant neoplasm of cervix uteri: Secondary | ICD-10-CM | POA: Diagnosis not present

## 2022-05-24 DIAGNOSIS — C539 Malignant neoplasm of cervix uteri, unspecified: Secondary | ICD-10-CM | POA: Diagnosis not present

## 2022-05-24 DIAGNOSIS — Z90710 Acquired absence of both cervix and uterus: Secondary | ICD-10-CM | POA: Diagnosis not present

## 2022-05-25 DIAGNOSIS — Z7409 Other reduced mobility: Secondary | ICD-10-CM | POA: Diagnosis not present

## 2022-05-25 DIAGNOSIS — Z008 Encounter for other general examination: Secondary | ICD-10-CM | POA: Diagnosis not present

## 2022-05-25 DIAGNOSIS — N393 Stress incontinence (female) (male): Secondary | ICD-10-CM | POA: Diagnosis not present

## 2022-05-25 DIAGNOSIS — I739 Peripheral vascular disease, unspecified: Secondary | ICD-10-CM | POA: Diagnosis not present

## 2022-05-25 DIAGNOSIS — K219 Gastro-esophageal reflux disease without esophagitis: Secondary | ICD-10-CM | POA: Diagnosis not present

## 2022-05-25 DIAGNOSIS — R110 Nausea: Secondary | ICD-10-CM | POA: Diagnosis not present

## 2022-05-25 DIAGNOSIS — M069 Rheumatoid arthritis, unspecified: Secondary | ICD-10-CM | POA: Diagnosis not present

## 2022-05-25 DIAGNOSIS — Z7982 Long term (current) use of aspirin: Secondary | ICD-10-CM | POA: Diagnosis not present

## 2022-05-25 DIAGNOSIS — Z809 Family history of malignant neoplasm, unspecified: Secondary | ICD-10-CM | POA: Diagnosis not present

## 2022-05-25 DIAGNOSIS — Z791 Long term (current) use of non-steroidal anti-inflammatories (NSAID): Secondary | ICD-10-CM | POA: Diagnosis not present

## 2022-05-25 DIAGNOSIS — I10 Essential (primary) hypertension: Secondary | ICD-10-CM | POA: Diagnosis not present

## 2022-05-25 DIAGNOSIS — E538 Deficiency of other specified B group vitamins: Secondary | ICD-10-CM | POA: Diagnosis not present

## 2022-05-25 DIAGNOSIS — E785 Hyperlipidemia, unspecified: Secondary | ICD-10-CM | POA: Diagnosis not present

## 2022-05-29 DIAGNOSIS — M85851 Other specified disorders of bone density and structure, right thigh: Secondary | ICD-10-CM | POA: Diagnosis not present

## 2022-05-29 DIAGNOSIS — T84021D Dislocation of internal left hip prosthesis, subsequent encounter: Secondary | ICD-10-CM | POA: Diagnosis not present

## 2022-05-29 DIAGNOSIS — T8451XD Infection and inflammatory reaction due to internal right hip prosthesis, subsequent encounter: Secondary | ICD-10-CM | POA: Diagnosis not present

## 2022-05-29 DIAGNOSIS — X58XXXD Exposure to other specified factors, subsequent encounter: Secondary | ICD-10-CM | POA: Diagnosis not present

## 2022-05-29 DIAGNOSIS — T8459XD Infection and inflammatory reaction due to other internal joint prosthesis, subsequent encounter: Secondary | ICD-10-CM | POA: Diagnosis not present

## 2022-05-29 DIAGNOSIS — Z96642 Presence of left artificial hip joint: Secondary | ICD-10-CM | POA: Diagnosis not present

## 2022-05-31 DIAGNOSIS — R32 Unspecified urinary incontinence: Secondary | ICD-10-CM | POA: Diagnosis not present

## 2022-05-31 DIAGNOSIS — N3040 Irradiation cystitis without hematuria: Secondary | ICD-10-CM | POA: Diagnosis not present

## 2022-05-31 DIAGNOSIS — I129 Hypertensive chronic kidney disease with stage 1 through stage 4 chronic kidney disease, or unspecified chronic kidney disease: Secondary | ICD-10-CM | POA: Diagnosis not present

## 2022-05-31 DIAGNOSIS — T84030D Mechanical loosening of internal right hip prosthetic joint, subsequent encounter: Secondary | ICD-10-CM | POA: Diagnosis not present

## 2022-05-31 DIAGNOSIS — T84038A Mechanical loosening of other internal prosthetic joint, initial encounter: Secondary | ICD-10-CM | POA: Diagnosis not present

## 2022-05-31 DIAGNOSIS — N3946 Mixed incontinence: Secondary | ICD-10-CM | POA: Diagnosis not present

## 2022-05-31 DIAGNOSIS — Z96642 Presence of left artificial hip joint: Secondary | ICD-10-CM | POA: Diagnosis not present

## 2022-05-31 DIAGNOSIS — T8451XA Infection and inflammatory reaction due to internal right hip prosthesis, initial encounter: Secondary | ICD-10-CM | POA: Diagnosis not present

## 2022-05-31 DIAGNOSIS — Z96651 Presence of right artificial knee joint: Secondary | ICD-10-CM | POA: Diagnosis not present

## 2022-05-31 DIAGNOSIS — Z7982 Long term (current) use of aspirin: Secondary | ICD-10-CM | POA: Diagnosis not present

## 2022-05-31 DIAGNOSIS — Z90710 Acquired absence of both cervix and uterus: Secondary | ICD-10-CM | POA: Diagnosis not present

## 2022-05-31 DIAGNOSIS — Z87891 Personal history of nicotine dependence: Secondary | ICD-10-CM | POA: Diagnosis not present

## 2022-05-31 DIAGNOSIS — C539 Malignant neoplasm of cervix uteri, unspecified: Secondary | ICD-10-CM | POA: Diagnosis not present

## 2022-05-31 DIAGNOSIS — H2512 Age-related nuclear cataract, left eye: Secondary | ICD-10-CM | POA: Diagnosis not present

## 2022-05-31 DIAGNOSIS — N184 Chronic kidney disease, stage 4 (severe): Secondary | ICD-10-CM | POA: Diagnosis not present

## 2022-05-31 DIAGNOSIS — D62 Acute posthemorrhagic anemia: Secondary | ICD-10-CM | POA: Diagnosis not present

## 2022-05-31 DIAGNOSIS — N200 Calculus of kidney: Secondary | ICD-10-CM | POA: Diagnosis not present

## 2022-05-31 DIAGNOSIS — H25812 Combined forms of age-related cataract, left eye: Secondary | ICD-10-CM | POA: Diagnosis not present

## 2022-05-31 DIAGNOSIS — A419 Sepsis, unspecified organism: Secondary | ICD-10-CM | POA: Diagnosis not present

## 2022-05-31 DIAGNOSIS — M0800 Unspecified juvenile rheumatoid arthritis of unspecified site: Secondary | ICD-10-CM | POA: Diagnosis not present

## 2022-05-31 DIAGNOSIS — Z452 Encounter for adjustment and management of vascular access device: Secondary | ICD-10-CM | POA: Diagnosis not present

## 2022-05-31 DIAGNOSIS — Z8541 Personal history of malignant neoplasm of cervix uteri: Secondary | ICD-10-CM | POA: Diagnosis not present

## 2022-06-01 DIAGNOSIS — Z7982 Long term (current) use of aspirin: Secondary | ICD-10-CM | POA: Diagnosis not present

## 2022-06-01 DIAGNOSIS — I129 Hypertensive chronic kidney disease with stage 1 through stage 4 chronic kidney disease, or unspecified chronic kidney disease: Secondary | ICD-10-CM | POA: Diagnosis not present

## 2022-06-01 DIAGNOSIS — D62 Acute posthemorrhagic anemia: Secondary | ICD-10-CM | POA: Diagnosis not present

## 2022-06-01 DIAGNOSIS — C539 Malignant neoplasm of cervix uteri, unspecified: Secondary | ICD-10-CM | POA: Diagnosis not present

## 2022-06-01 DIAGNOSIS — Z8541 Personal history of malignant neoplasm of cervix uteri: Secondary | ICD-10-CM | POA: Diagnosis not present

## 2022-06-01 DIAGNOSIS — T8451XA Infection and inflammatory reaction due to internal right hip prosthesis, initial encounter: Secondary | ICD-10-CM | POA: Diagnosis not present

## 2022-06-01 DIAGNOSIS — A419 Sepsis, unspecified organism: Secondary | ICD-10-CM | POA: Diagnosis not present

## 2022-06-01 DIAGNOSIS — Z96642 Presence of left artificial hip joint: Secondary | ICD-10-CM | POA: Diagnosis not present

## 2022-06-01 DIAGNOSIS — N200 Calculus of kidney: Secondary | ICD-10-CM | POA: Diagnosis not present

## 2022-06-01 DIAGNOSIS — T84038A Mechanical loosening of other internal prosthetic joint, initial encounter: Secondary | ICD-10-CM | POA: Diagnosis not present

## 2022-06-01 DIAGNOSIS — N3946 Mixed incontinence: Secondary | ICD-10-CM | POA: Diagnosis not present

## 2022-06-01 DIAGNOSIS — H25812 Combined forms of age-related cataract, left eye: Secondary | ICD-10-CM | POA: Diagnosis not present

## 2022-06-01 DIAGNOSIS — Z90710 Acquired absence of both cervix and uterus: Secondary | ICD-10-CM | POA: Diagnosis not present

## 2022-06-01 DIAGNOSIS — T84030D Mechanical loosening of internal right hip prosthetic joint, subsequent encounter: Secondary | ICD-10-CM | POA: Diagnosis not present

## 2022-06-01 DIAGNOSIS — N3040 Irradiation cystitis without hematuria: Secondary | ICD-10-CM | POA: Diagnosis not present

## 2022-06-01 DIAGNOSIS — H2512 Age-related nuclear cataract, left eye: Secondary | ICD-10-CM | POA: Diagnosis not present

## 2022-06-01 DIAGNOSIS — Z452 Encounter for adjustment and management of vascular access device: Secondary | ICD-10-CM | POA: Diagnosis not present

## 2022-06-01 DIAGNOSIS — M0800 Unspecified juvenile rheumatoid arthritis of unspecified site: Secondary | ICD-10-CM | POA: Diagnosis not present

## 2022-06-01 DIAGNOSIS — Z96651 Presence of right artificial knee joint: Secondary | ICD-10-CM | POA: Diagnosis not present

## 2022-06-01 DIAGNOSIS — R32 Unspecified urinary incontinence: Secondary | ICD-10-CM | POA: Diagnosis not present

## 2022-06-01 DIAGNOSIS — Z87891 Personal history of nicotine dependence: Secondary | ICD-10-CM | POA: Diagnosis not present

## 2022-06-01 DIAGNOSIS — N184 Chronic kidney disease, stage 4 (severe): Secondary | ICD-10-CM | POA: Diagnosis not present

## 2022-06-05 DIAGNOSIS — N390 Urinary tract infection, site not specified: Secondary | ICD-10-CM | POA: Diagnosis not present

## 2022-06-06 DIAGNOSIS — H2512 Age-related nuclear cataract, left eye: Secondary | ICD-10-CM | POA: Diagnosis not present

## 2022-06-06 DIAGNOSIS — N200 Calculus of kidney: Secondary | ICD-10-CM | POA: Diagnosis not present

## 2022-06-06 DIAGNOSIS — Z452 Encounter for adjustment and management of vascular access device: Secondary | ICD-10-CM | POA: Diagnosis not present

## 2022-06-06 DIAGNOSIS — H25812 Combined forms of age-related cataract, left eye: Secondary | ICD-10-CM | POA: Diagnosis not present

## 2022-06-06 DIAGNOSIS — A419 Sepsis, unspecified organism: Secondary | ICD-10-CM | POA: Diagnosis not present

## 2022-06-06 DIAGNOSIS — I129 Hypertensive chronic kidney disease with stage 1 through stage 4 chronic kidney disease, or unspecified chronic kidney disease: Secondary | ICD-10-CM | POA: Diagnosis not present

## 2022-06-06 DIAGNOSIS — R32 Unspecified urinary incontinence: Secondary | ICD-10-CM | POA: Diagnosis not present

## 2022-06-06 DIAGNOSIS — T84038A Mechanical loosening of other internal prosthetic joint, initial encounter: Secondary | ICD-10-CM | POA: Diagnosis not present

## 2022-06-06 DIAGNOSIS — Z87891 Personal history of nicotine dependence: Secondary | ICD-10-CM | POA: Diagnosis not present

## 2022-06-06 DIAGNOSIS — Z96642 Presence of left artificial hip joint: Secondary | ICD-10-CM | POA: Diagnosis not present

## 2022-06-06 DIAGNOSIS — N3040 Irradiation cystitis without hematuria: Secondary | ICD-10-CM | POA: Diagnosis not present

## 2022-06-06 DIAGNOSIS — N184 Chronic kidney disease, stage 4 (severe): Secondary | ICD-10-CM | POA: Diagnosis not present

## 2022-06-06 DIAGNOSIS — D62 Acute posthemorrhagic anemia: Secondary | ICD-10-CM | POA: Diagnosis not present

## 2022-06-06 DIAGNOSIS — Z90710 Acquired absence of both cervix and uterus: Secondary | ICD-10-CM | POA: Diagnosis not present

## 2022-06-06 DIAGNOSIS — M0800 Unspecified juvenile rheumatoid arthritis of unspecified site: Secondary | ICD-10-CM | POA: Diagnosis not present

## 2022-06-06 DIAGNOSIS — C539 Malignant neoplasm of cervix uteri, unspecified: Secondary | ICD-10-CM | POA: Diagnosis not present

## 2022-06-06 DIAGNOSIS — Z8541 Personal history of malignant neoplasm of cervix uteri: Secondary | ICD-10-CM | POA: Diagnosis not present

## 2022-06-06 DIAGNOSIS — Z96651 Presence of right artificial knee joint: Secondary | ICD-10-CM | POA: Diagnosis not present

## 2022-06-06 DIAGNOSIS — N3946 Mixed incontinence: Secondary | ICD-10-CM | POA: Diagnosis not present

## 2022-06-06 DIAGNOSIS — T84030D Mechanical loosening of internal right hip prosthetic joint, subsequent encounter: Secondary | ICD-10-CM | POA: Diagnosis not present

## 2022-06-06 DIAGNOSIS — T8451XA Infection and inflammatory reaction due to internal right hip prosthesis, initial encounter: Secondary | ICD-10-CM | POA: Diagnosis not present

## 2022-06-06 DIAGNOSIS — Z7982 Long term (current) use of aspirin: Secondary | ICD-10-CM | POA: Diagnosis not present

## 2022-06-07 DIAGNOSIS — M25451 Effusion, right hip: Secondary | ICD-10-CM | POA: Diagnosis not present

## 2022-06-07 DIAGNOSIS — M25551 Pain in right hip: Secondary | ICD-10-CM | POA: Diagnosis not present

## 2022-06-07 DIAGNOSIS — T8459XD Infection and inflammatory reaction due to other internal joint prosthesis, subsequent encounter: Secondary | ICD-10-CM | POA: Diagnosis not present

## 2022-06-07 DIAGNOSIS — Y831 Surgical operation with implant of artificial internal device as the cause of abnormal reaction of the patient, or of later complication, without mention of misadventure at the time of the procedure: Secondary | ICD-10-CM | POA: Diagnosis not present

## 2022-06-13 DIAGNOSIS — M05752 Rheumatoid arthritis with rheumatoid factor of left hip without organ or systems involvement: Secondary | ICD-10-CM | POA: Diagnosis not present

## 2022-06-13 DIAGNOSIS — Z01818 Encounter for other preprocedural examination: Secondary | ICD-10-CM | POA: Diagnosis not present

## 2022-06-13 DIAGNOSIS — N390 Urinary tract infection, site not specified: Secondary | ICD-10-CM | POA: Diagnosis not present

## 2022-06-13 DIAGNOSIS — M05751 Rheumatoid arthritis with rheumatoid factor of right hip without organ or systems involvement: Secondary | ICD-10-CM | POA: Diagnosis not present

## 2022-06-13 DIAGNOSIS — T8450XS Infection and inflammatory reaction due to unspecified internal joint prosthesis, sequela: Secondary | ICD-10-CM | POA: Diagnosis not present

## 2022-06-13 DIAGNOSIS — R69 Illness, unspecified: Secondary | ICD-10-CM | POA: Diagnosis not present

## 2022-06-13 DIAGNOSIS — N184 Chronic kidney disease, stage 4 (severe): Secondary | ICD-10-CM | POA: Diagnosis not present

## 2022-06-13 DIAGNOSIS — Y831 Surgical operation with implant of artificial internal device as the cause of abnormal reaction of the patient, or of later complication, without mention of misadventure at the time of the procedure: Secondary | ICD-10-CM | POA: Diagnosis not present

## 2022-06-13 DIAGNOSIS — Z87891 Personal history of nicotine dependence: Secondary | ICD-10-CM | POA: Diagnosis not present

## 2022-06-13 DIAGNOSIS — T884XXS Failed or difficult intubation, sequela: Secondary | ICD-10-CM | POA: Diagnosis not present

## 2022-06-18 DIAGNOSIS — Z90710 Acquired absence of both cervix and uterus: Secondary | ICD-10-CM | POA: Diagnosis not present

## 2022-06-18 DIAGNOSIS — T8451XA Infection and inflammatory reaction due to internal right hip prosthesis, initial encounter: Secondary | ICD-10-CM | POA: Diagnosis not present

## 2022-06-18 DIAGNOSIS — M0800 Unspecified juvenile rheumatoid arthritis of unspecified site: Secondary | ICD-10-CM | POA: Diagnosis not present

## 2022-06-18 DIAGNOSIS — Z7982 Long term (current) use of aspirin: Secondary | ICD-10-CM | POA: Diagnosis not present

## 2022-06-18 DIAGNOSIS — N184 Chronic kidney disease, stage 4 (severe): Secondary | ICD-10-CM | POA: Diagnosis not present

## 2022-06-18 DIAGNOSIS — A419 Sepsis, unspecified organism: Secondary | ICD-10-CM | POA: Diagnosis not present

## 2022-06-18 DIAGNOSIS — H25812 Combined forms of age-related cataract, left eye: Secondary | ICD-10-CM | POA: Diagnosis not present

## 2022-06-18 DIAGNOSIS — D62 Acute posthemorrhagic anemia: Secondary | ICD-10-CM | POA: Diagnosis not present

## 2022-06-18 DIAGNOSIS — N200 Calculus of kidney: Secondary | ICD-10-CM | POA: Diagnosis not present

## 2022-06-18 DIAGNOSIS — Z87891 Personal history of nicotine dependence: Secondary | ICD-10-CM | POA: Diagnosis not present

## 2022-06-18 DIAGNOSIS — Z8541 Personal history of malignant neoplasm of cervix uteri: Secondary | ICD-10-CM | POA: Diagnosis not present

## 2022-06-18 DIAGNOSIS — T84038A Mechanical loosening of other internal prosthetic joint, initial encounter: Secondary | ICD-10-CM | POA: Diagnosis not present

## 2022-06-18 DIAGNOSIS — N3946 Mixed incontinence: Secondary | ICD-10-CM | POA: Diagnosis not present

## 2022-06-18 DIAGNOSIS — R32 Unspecified urinary incontinence: Secondary | ICD-10-CM | POA: Diagnosis not present

## 2022-06-18 DIAGNOSIS — N3040 Irradiation cystitis without hematuria: Secondary | ICD-10-CM | POA: Diagnosis not present

## 2022-06-18 DIAGNOSIS — Z452 Encounter for adjustment and management of vascular access device: Secondary | ICD-10-CM | POA: Diagnosis not present

## 2022-06-18 DIAGNOSIS — I129 Hypertensive chronic kidney disease with stage 1 through stage 4 chronic kidney disease, or unspecified chronic kidney disease: Secondary | ICD-10-CM | POA: Diagnosis not present

## 2022-06-18 DIAGNOSIS — T84030D Mechanical loosening of internal right hip prosthetic joint, subsequent encounter: Secondary | ICD-10-CM | POA: Diagnosis not present

## 2022-06-18 DIAGNOSIS — H2512 Age-related nuclear cataract, left eye: Secondary | ICD-10-CM | POA: Diagnosis not present

## 2022-06-18 DIAGNOSIS — C539 Malignant neoplasm of cervix uteri, unspecified: Secondary | ICD-10-CM | POA: Diagnosis not present

## 2022-06-18 DIAGNOSIS — Z96642 Presence of left artificial hip joint: Secondary | ICD-10-CM | POA: Diagnosis not present

## 2022-06-18 DIAGNOSIS — Z96651 Presence of right artificial knee joint: Secondary | ICD-10-CM | POA: Diagnosis not present

## 2022-06-21 DIAGNOSIS — Z96649 Presence of unspecified artificial hip joint: Secondary | ICD-10-CM | POA: Diagnosis not present

## 2022-06-25 DIAGNOSIS — N184 Chronic kidney disease, stage 4 (severe): Secondary | ICD-10-CM | POA: Diagnosis not present

## 2022-06-25 DIAGNOSIS — D62 Acute posthemorrhagic anemia: Secondary | ICD-10-CM | POA: Diagnosis not present

## 2022-06-25 DIAGNOSIS — T84038A Mechanical loosening of other internal prosthetic joint, initial encounter: Secondary | ICD-10-CM | POA: Diagnosis not present

## 2022-06-25 DIAGNOSIS — Z96642 Presence of left artificial hip joint: Secondary | ICD-10-CM | POA: Diagnosis not present

## 2022-06-25 DIAGNOSIS — C539 Malignant neoplasm of cervix uteri, unspecified: Secondary | ICD-10-CM | POA: Diagnosis not present

## 2022-06-25 DIAGNOSIS — Z452 Encounter for adjustment and management of vascular access device: Secondary | ICD-10-CM | POA: Diagnosis not present

## 2022-06-25 DIAGNOSIS — N3946 Mixed incontinence: Secondary | ICD-10-CM | POA: Diagnosis not present

## 2022-06-25 DIAGNOSIS — N3040 Irradiation cystitis without hematuria: Secondary | ICD-10-CM | POA: Diagnosis not present

## 2022-06-25 DIAGNOSIS — Z90710 Acquired absence of both cervix and uterus: Secondary | ICD-10-CM | POA: Diagnosis not present

## 2022-06-25 DIAGNOSIS — T84030D Mechanical loosening of internal right hip prosthetic joint, subsequent encounter: Secondary | ICD-10-CM | POA: Diagnosis not present

## 2022-06-25 DIAGNOSIS — M0800 Unspecified juvenile rheumatoid arthritis of unspecified site: Secondary | ICD-10-CM | POA: Diagnosis not present

## 2022-06-25 DIAGNOSIS — Z87891 Personal history of nicotine dependence: Secondary | ICD-10-CM | POA: Diagnosis not present

## 2022-06-25 DIAGNOSIS — H25812 Combined forms of age-related cataract, left eye: Secondary | ICD-10-CM | POA: Diagnosis not present

## 2022-06-25 DIAGNOSIS — Z8541 Personal history of malignant neoplasm of cervix uteri: Secondary | ICD-10-CM | POA: Diagnosis not present

## 2022-06-25 DIAGNOSIS — Z96651 Presence of right artificial knee joint: Secondary | ICD-10-CM | POA: Diagnosis not present

## 2022-06-25 DIAGNOSIS — H2512 Age-related nuclear cataract, left eye: Secondary | ICD-10-CM | POA: Diagnosis not present

## 2022-06-25 DIAGNOSIS — R32 Unspecified urinary incontinence: Secondary | ICD-10-CM | POA: Diagnosis not present

## 2022-06-25 DIAGNOSIS — I129 Hypertensive chronic kidney disease with stage 1 through stage 4 chronic kidney disease, or unspecified chronic kidney disease: Secondary | ICD-10-CM | POA: Diagnosis not present

## 2022-06-25 DIAGNOSIS — T8451XA Infection and inflammatory reaction due to internal right hip prosthesis, initial encounter: Secondary | ICD-10-CM | POA: Diagnosis not present

## 2022-06-25 DIAGNOSIS — Z7982 Long term (current) use of aspirin: Secondary | ICD-10-CM | POA: Diagnosis not present

## 2022-06-25 DIAGNOSIS — N200 Calculus of kidney: Secondary | ICD-10-CM | POA: Diagnosis not present

## 2022-07-02 DIAGNOSIS — T8450XD Infection and inflammatory reaction due to unspecified internal joint prosthesis, subsequent encounter: Secondary | ICD-10-CM | POA: Diagnosis not present

## 2022-07-02 DIAGNOSIS — Z4682 Encounter for fitting and adjustment of non-vascular catheter: Secondary | ICD-10-CM | POA: Diagnosis not present

## 2022-07-02 DIAGNOSIS — N184 Chronic kidney disease, stage 4 (severe): Secondary | ICD-10-CM | POA: Diagnosis not present

## 2022-07-02 DIAGNOSIS — Z4732 Aftercare following explantation of hip joint prosthesis: Secondary | ICD-10-CM | POA: Diagnosis not present

## 2022-07-02 DIAGNOSIS — R0689 Other abnormalities of breathing: Secondary | ICD-10-CM | POA: Diagnosis not present

## 2022-07-02 DIAGNOSIS — N3040 Irradiation cystitis without hematuria: Secondary | ICD-10-CM | POA: Diagnosis not present

## 2022-07-02 DIAGNOSIS — M238X1 Other internal derangements of right knee: Secondary | ICD-10-CM | POA: Diagnosis not present

## 2022-07-02 DIAGNOSIS — N390 Urinary tract infection, site not specified: Secondary | ICD-10-CM | POA: Diagnosis not present

## 2022-07-02 DIAGNOSIS — R739 Hyperglycemia, unspecified: Secondary | ICD-10-CM | POA: Diagnosis not present

## 2022-07-02 DIAGNOSIS — Z96649 Presence of unspecified artificial hip joint: Secondary | ICD-10-CM | POA: Diagnosis not present

## 2022-07-02 DIAGNOSIS — I129 Hypertensive chronic kidney disease with stage 1 through stage 4 chronic kidney disease, or unspecified chronic kidney disease: Secondary | ICD-10-CM | POA: Diagnosis not present

## 2022-07-02 DIAGNOSIS — M1610 Unilateral primary osteoarthritis, unspecified hip: Secondary | ICD-10-CM | POA: Diagnosis not present

## 2022-07-02 DIAGNOSIS — Z89621 Acquired absence of right hip joint: Secondary | ICD-10-CM | POA: Diagnosis not present

## 2022-07-02 DIAGNOSIS — M069 Rheumatoid arthritis, unspecified: Secondary | ICD-10-CM | POA: Diagnosis not present

## 2022-07-02 DIAGNOSIS — R8281 Pyuria: Secondary | ICD-10-CM | POA: Diagnosis not present

## 2022-07-02 DIAGNOSIS — G8918 Other acute postprocedural pain: Secondary | ICD-10-CM | POA: Diagnosis not present

## 2022-07-02 DIAGNOSIS — M8580 Other specified disorders of bone density and structure, unspecified site: Secondary | ICD-10-CM | POA: Diagnosis not present

## 2022-07-02 DIAGNOSIS — D62 Acute posthemorrhagic anemia: Secondary | ICD-10-CM | POA: Diagnosis not present

## 2022-07-02 DIAGNOSIS — T8451XA Infection and inflammatory reaction due to internal right hip prosthesis, initial encounter: Secondary | ICD-10-CM | POA: Diagnosis not present

## 2022-07-02 DIAGNOSIS — J90 Pleural effusion, not elsewhere classified: Secondary | ICD-10-CM | POA: Diagnosis not present

## 2022-07-02 DIAGNOSIS — Z8541 Personal history of malignant neoplasm of cervix uteri: Secondary | ICD-10-CM | POA: Diagnosis not present

## 2022-07-02 DIAGNOSIS — Z8669 Personal history of other diseases of the nervous system and sense organs: Secondary | ICD-10-CM | POA: Diagnosis not present

## 2022-07-02 DIAGNOSIS — Z90710 Acquired absence of both cervix and uterus: Secondary | ICD-10-CM | POA: Diagnosis not present

## 2022-07-02 DIAGNOSIS — I9581 Postprocedural hypotension: Secondary | ICD-10-CM | POA: Diagnosis not present

## 2022-07-02 DIAGNOSIS — T8450XS Infection and inflammatory reaction due to unspecified internal joint prosthesis, sequela: Secondary | ICD-10-CM | POA: Diagnosis not present

## 2022-07-02 DIAGNOSIS — R768 Other specified abnormal immunological findings in serum: Secondary | ICD-10-CM | POA: Diagnosis not present

## 2022-07-02 DIAGNOSIS — T84038A Mechanical loosening of other internal prosthetic joint, initial encounter: Secondary | ICD-10-CM | POA: Diagnosis not present

## 2022-07-02 DIAGNOSIS — M0809 Unspecified juvenile rheumatoid arthritis, multiple sites: Secondary | ICD-10-CM | POA: Diagnosis not present

## 2022-07-02 DIAGNOSIS — Z8739 Personal history of other diseases of the musculoskeletal system and connective tissue: Secondary | ICD-10-CM | POA: Diagnosis not present

## 2022-07-02 DIAGNOSIS — T8451XS Infection and inflammatory reaction due to internal right hip prosthesis, sequela: Secondary | ICD-10-CM | POA: Diagnosis not present

## 2022-07-02 DIAGNOSIS — Z9889 Other specified postprocedural states: Secondary | ICD-10-CM | POA: Diagnosis not present

## 2022-07-02 DIAGNOSIS — D500 Iron deficiency anemia secondary to blood loss (chronic): Secondary | ICD-10-CM | POA: Diagnosis not present

## 2022-07-02 DIAGNOSIS — R69 Illness, unspecified: Secondary | ICD-10-CM | POA: Diagnosis not present

## 2022-07-02 DIAGNOSIS — E872 Acidosis, unspecified: Secondary | ICD-10-CM | POA: Diagnosis not present

## 2022-07-02 DIAGNOSIS — Z452 Encounter for adjustment and management of vascular access device: Secondary | ICD-10-CM | POA: Diagnosis not present

## 2022-07-02 DIAGNOSIS — K5900 Constipation, unspecified: Secondary | ICD-10-CM | POA: Diagnosis not present

## 2022-07-02 DIAGNOSIS — B957 Other staphylococcus as the cause of diseases classified elsewhere: Secondary | ICD-10-CM | POA: Diagnosis not present

## 2022-07-02 DIAGNOSIS — D631 Anemia in chronic kidney disease: Secondary | ICD-10-CM | POA: Diagnosis not present

## 2022-07-02 DIAGNOSIS — Z96643 Presence of artificial hip joint, bilateral: Secondary | ICD-10-CM | POA: Diagnosis not present

## 2022-07-02 DIAGNOSIS — M25561 Pain in right knee: Secondary | ICD-10-CM | POA: Diagnosis not present

## 2022-07-02 DIAGNOSIS — M85861 Other specified disorders of bone density and structure, right lower leg: Secondary | ICD-10-CM | POA: Diagnosis not present

## 2022-07-02 DIAGNOSIS — Z9911 Dependence on respirator [ventilator] status: Secondary | ICD-10-CM | POA: Diagnosis not present

## 2022-07-02 DIAGNOSIS — Z8616 Personal history of COVID-19: Secondary | ICD-10-CM | POA: Diagnosis not present

## 2022-07-02 DIAGNOSIS — Y792 Prosthetic and other implants, materials and accessory orthopedic devices associated with adverse incidents: Secondary | ICD-10-CM | POA: Diagnosis not present

## 2022-07-02 DIAGNOSIS — Z96651 Presence of right artificial knee joint: Secondary | ICD-10-CM | POA: Diagnosis not present

## 2022-07-02 DIAGNOSIS — Z87448 Personal history of other diseases of urinary system: Secondary | ICD-10-CM | POA: Diagnosis not present

## 2022-07-02 DIAGNOSIS — B952 Enterococcus as the cause of diseases classified elsewhere: Secondary | ICD-10-CM | POA: Diagnosis not present

## 2022-07-02 DIAGNOSIS — Z87440 Personal history of urinary (tract) infections: Secondary | ICD-10-CM | POA: Diagnosis not present

## 2022-07-02 DIAGNOSIS — Z08 Encounter for follow-up examination after completed treatment for malignant neoplasm: Secondary | ICD-10-CM | POA: Diagnosis not present

## 2022-07-02 DIAGNOSIS — Z923 Personal history of irradiation: Secondary | ICD-10-CM | POA: Diagnosis not present

## 2022-07-02 DIAGNOSIS — G629 Polyneuropathy, unspecified: Secondary | ICD-10-CM | POA: Diagnosis not present

## 2022-07-02 DIAGNOSIS — Z8619 Personal history of other infectious and parasitic diseases: Secondary | ICD-10-CM | POA: Diagnosis not present

## 2022-07-02 DIAGNOSIS — K5901 Slow transit constipation: Secondary | ICD-10-CM | POA: Diagnosis not present

## 2022-07-02 DIAGNOSIS — N3946 Mixed incontinence: Secondary | ICD-10-CM | POA: Diagnosis not present

## 2022-07-02 DIAGNOSIS — T84038D Mechanical loosening of other internal prosthetic joint, subsequent encounter: Secondary | ICD-10-CM | POA: Diagnosis not present

## 2022-07-02 DIAGNOSIS — N179 Acute kidney failure, unspecified: Secondary | ICD-10-CM | POA: Diagnosis not present

## 2022-07-02 DIAGNOSIS — B954 Other streptococcus as the cause of diseases classified elsewhere: Secondary | ICD-10-CM | POA: Diagnosis not present

## 2022-07-02 DIAGNOSIS — T847XXA Infection and inflammatory reaction due to other internal orthopedic prosthetic devices, implants and grafts, initial encounter: Secondary | ICD-10-CM | POA: Diagnosis not present

## 2022-07-03 DIAGNOSIS — M1610 Unilateral primary osteoarthritis, unspecified hip: Secondary | ICD-10-CM | POA: Diagnosis not present

## 2022-07-03 DIAGNOSIS — Z4682 Encounter for fitting and adjustment of non-vascular catheter: Secondary | ICD-10-CM | POA: Diagnosis not present

## 2022-07-03 DIAGNOSIS — R0689 Other abnormalities of breathing: Secondary | ICD-10-CM | POA: Diagnosis not present

## 2022-07-03 DIAGNOSIS — Z96651 Presence of right artificial knee joint: Secondary | ICD-10-CM | POA: Diagnosis not present

## 2022-07-03 DIAGNOSIS — Z452 Encounter for adjustment and management of vascular access device: Secondary | ICD-10-CM | POA: Diagnosis not present

## 2022-07-03 DIAGNOSIS — J90 Pleural effusion, not elsewhere classified: Secondary | ICD-10-CM | POA: Diagnosis not present

## 2022-07-07 DIAGNOSIS — N179 Acute kidney failure, unspecified: Secondary | ICD-10-CM | POA: Diagnosis not present

## 2022-07-07 DIAGNOSIS — D62 Acute posthemorrhagic anemia: Secondary | ICD-10-CM | POA: Diagnosis not present

## 2022-07-08 DIAGNOSIS — N179 Acute kidney failure, unspecified: Secondary | ICD-10-CM | POA: Diagnosis not present

## 2022-07-08 DIAGNOSIS — T8450XS Infection and inflammatory reaction due to unspecified internal joint prosthesis, sequela: Secondary | ICD-10-CM | POA: Diagnosis not present

## 2022-07-08 DIAGNOSIS — M1610 Unilateral primary osteoarthritis, unspecified hip: Secondary | ICD-10-CM | POA: Diagnosis not present

## 2022-07-08 DIAGNOSIS — K5901 Slow transit constipation: Secondary | ICD-10-CM | POA: Diagnosis not present

## 2022-07-20 DIAGNOSIS — R279 Unspecified lack of coordination: Secondary | ICD-10-CM | POA: Diagnosis not present

## 2022-07-20 DIAGNOSIS — Z743 Need for continuous supervision: Secondary | ICD-10-CM | POA: Diagnosis not present

## 2022-07-21 DIAGNOSIS — H2512 Age-related nuclear cataract, left eye: Secondary | ICD-10-CM | POA: Diagnosis not present

## 2022-07-21 DIAGNOSIS — Z8541 Personal history of malignant neoplasm of cervix uteri: Secondary | ICD-10-CM | POA: Diagnosis not present

## 2022-07-21 DIAGNOSIS — N184 Chronic kidney disease, stage 4 (severe): Secondary | ICD-10-CM | POA: Diagnosis not present

## 2022-07-21 DIAGNOSIS — D62 Acute posthemorrhagic anemia: Secondary | ICD-10-CM | POA: Diagnosis not present

## 2022-07-21 DIAGNOSIS — T84038A Mechanical loosening of other internal prosthetic joint, initial encounter: Secondary | ICD-10-CM | POA: Diagnosis not present

## 2022-07-21 DIAGNOSIS — N200 Calculus of kidney: Secondary | ICD-10-CM | POA: Diagnosis not present

## 2022-07-21 DIAGNOSIS — Z87891 Personal history of nicotine dependence: Secondary | ICD-10-CM | POA: Diagnosis not present

## 2022-07-21 DIAGNOSIS — N3946 Mixed incontinence: Secondary | ICD-10-CM | POA: Diagnosis not present

## 2022-07-21 DIAGNOSIS — R32 Unspecified urinary incontinence: Secondary | ICD-10-CM | POA: Diagnosis not present

## 2022-07-21 DIAGNOSIS — Z96651 Presence of right artificial knee joint: Secondary | ICD-10-CM | POA: Diagnosis not present

## 2022-07-21 DIAGNOSIS — M0800 Unspecified juvenile rheumatoid arthritis of unspecified site: Secondary | ICD-10-CM | POA: Diagnosis not present

## 2022-07-21 DIAGNOSIS — C539 Malignant neoplasm of cervix uteri, unspecified: Secondary | ICD-10-CM | POA: Diagnosis not present

## 2022-07-21 DIAGNOSIS — Z452 Encounter for adjustment and management of vascular access device: Secondary | ICD-10-CM | POA: Diagnosis not present

## 2022-07-21 DIAGNOSIS — N3040 Irradiation cystitis without hematuria: Secondary | ICD-10-CM | POA: Diagnosis not present

## 2022-07-21 DIAGNOSIS — Z96642 Presence of left artificial hip joint: Secondary | ICD-10-CM | POA: Diagnosis not present

## 2022-07-21 DIAGNOSIS — Z7982 Long term (current) use of aspirin: Secondary | ICD-10-CM | POA: Diagnosis not present

## 2022-07-21 DIAGNOSIS — Z90710 Acquired absence of both cervix and uterus: Secondary | ICD-10-CM | POA: Diagnosis not present

## 2022-07-21 DIAGNOSIS — I129 Hypertensive chronic kidney disease with stage 1 through stage 4 chronic kidney disease, or unspecified chronic kidney disease: Secondary | ICD-10-CM | POA: Diagnosis not present

## 2022-07-21 DIAGNOSIS — T8451XA Infection and inflammatory reaction due to internal right hip prosthesis, initial encounter: Secondary | ICD-10-CM | POA: Diagnosis not present

## 2022-07-21 DIAGNOSIS — T84030D Mechanical loosening of internal right hip prosthetic joint, subsequent encounter: Secondary | ICD-10-CM | POA: Diagnosis not present

## 2022-07-21 DIAGNOSIS — H25812 Combined forms of age-related cataract, left eye: Secondary | ICD-10-CM | POA: Diagnosis not present

## 2022-07-22 DIAGNOSIS — Z96649 Presence of unspecified artificial hip joint: Secondary | ICD-10-CM | POA: Diagnosis not present

## 2022-07-22 DIAGNOSIS — T8450XS Infection and inflammatory reaction due to unspecified internal joint prosthesis, sequela: Secondary | ICD-10-CM | POA: Diagnosis not present

## 2022-07-23 DIAGNOSIS — Z90710 Acquired absence of both cervix and uterus: Secondary | ICD-10-CM | POA: Diagnosis not present

## 2022-07-23 DIAGNOSIS — T84030D Mechanical loosening of internal right hip prosthetic joint, subsequent encounter: Secondary | ICD-10-CM | POA: Diagnosis not present

## 2022-07-23 DIAGNOSIS — Z96642 Presence of left artificial hip joint: Secondary | ICD-10-CM | POA: Diagnosis not present

## 2022-07-23 DIAGNOSIS — M0800 Unspecified juvenile rheumatoid arthritis of unspecified site: Secondary | ICD-10-CM | POA: Diagnosis not present

## 2022-07-23 DIAGNOSIS — Z9181 History of falling: Secondary | ICD-10-CM | POA: Diagnosis not present

## 2022-07-23 DIAGNOSIS — N184 Chronic kidney disease, stage 4 (severe): Secondary | ICD-10-CM | POA: Diagnosis not present

## 2022-07-23 DIAGNOSIS — H25812 Combined forms of age-related cataract, left eye: Secondary | ICD-10-CM | POA: Diagnosis not present

## 2022-07-23 DIAGNOSIS — Z87442 Personal history of urinary calculi: Secondary | ICD-10-CM | POA: Diagnosis not present

## 2022-07-23 DIAGNOSIS — Z8541 Personal history of malignant neoplasm of cervix uteri: Secondary | ICD-10-CM | POA: Diagnosis not present

## 2022-07-23 DIAGNOSIS — Z452 Encounter for adjustment and management of vascular access device: Secondary | ICD-10-CM | POA: Diagnosis not present

## 2022-07-23 DIAGNOSIS — T84038A Mechanical loosening of other internal prosthetic joint, initial encounter: Secondary | ICD-10-CM | POA: Diagnosis not present

## 2022-07-23 DIAGNOSIS — R32 Unspecified urinary incontinence: Secondary | ICD-10-CM | POA: Diagnosis not present

## 2022-07-23 DIAGNOSIS — D62 Acute posthemorrhagic anemia: Secondary | ICD-10-CM | POA: Diagnosis not present

## 2022-07-23 DIAGNOSIS — I129 Hypertensive chronic kidney disease with stage 1 through stage 4 chronic kidney disease, or unspecified chronic kidney disease: Secondary | ICD-10-CM | POA: Diagnosis not present

## 2022-07-23 DIAGNOSIS — Z96651 Presence of right artificial knee joint: Secondary | ICD-10-CM | POA: Diagnosis not present

## 2022-07-23 DIAGNOSIS — Z87440 Personal history of urinary (tract) infections: Secondary | ICD-10-CM | POA: Diagnosis not present

## 2022-07-23 DIAGNOSIS — Z7982 Long term (current) use of aspirin: Secondary | ICD-10-CM | POA: Diagnosis not present

## 2022-07-23 DIAGNOSIS — T8451XA Infection and inflammatory reaction due to internal right hip prosthesis, initial encounter: Secondary | ICD-10-CM | POA: Diagnosis not present

## 2022-07-23 DIAGNOSIS — Z87891 Personal history of nicotine dependence: Secondary | ICD-10-CM | POA: Diagnosis not present

## 2022-07-23 DIAGNOSIS — H2512 Age-related nuclear cataract, left eye: Secondary | ICD-10-CM | POA: Diagnosis not present

## 2022-07-26 DIAGNOSIS — Z9181 History of falling: Secondary | ICD-10-CM | POA: Diagnosis not present

## 2022-07-26 DIAGNOSIS — Z87442 Personal history of urinary calculi: Secondary | ICD-10-CM | POA: Diagnosis not present

## 2022-07-26 DIAGNOSIS — T8451XA Infection and inflammatory reaction due to internal right hip prosthesis, initial encounter: Secondary | ICD-10-CM | POA: Diagnosis not present

## 2022-07-26 DIAGNOSIS — Z79899 Other long term (current) drug therapy: Secondary | ICD-10-CM | POA: Diagnosis not present

## 2022-07-26 DIAGNOSIS — Z87440 Personal history of urinary (tract) infections: Secondary | ICD-10-CM | POA: Diagnosis not present

## 2022-07-26 DIAGNOSIS — Z96642 Presence of left artificial hip joint: Secondary | ICD-10-CM | POA: Diagnosis not present

## 2022-07-26 DIAGNOSIS — Z87891 Personal history of nicotine dependence: Secondary | ICD-10-CM | POA: Diagnosis not present

## 2022-07-26 DIAGNOSIS — Z7982 Long term (current) use of aspirin: Secondary | ICD-10-CM | POA: Diagnosis not present

## 2022-07-26 DIAGNOSIS — H2512 Age-related nuclear cataract, left eye: Secondary | ICD-10-CM | POA: Diagnosis not present

## 2022-07-26 DIAGNOSIS — R32 Unspecified urinary incontinence: Secondary | ICD-10-CM | POA: Diagnosis not present

## 2022-07-26 DIAGNOSIS — M0800 Unspecified juvenile rheumatoid arthritis of unspecified site: Secondary | ICD-10-CM | POA: Diagnosis not present

## 2022-07-26 DIAGNOSIS — Z96651 Presence of right artificial knee joint: Secondary | ICD-10-CM | POA: Diagnosis not present

## 2022-07-26 DIAGNOSIS — T8450XD Infection and inflammatory reaction due to unspecified internal joint prosthesis, subsequent encounter: Secondary | ICD-10-CM | POA: Diagnosis not present

## 2022-07-26 DIAGNOSIS — H25812 Combined forms of age-related cataract, left eye: Secondary | ICD-10-CM | POA: Diagnosis not present

## 2022-07-26 DIAGNOSIS — D62 Acute posthemorrhagic anemia: Secondary | ICD-10-CM | POA: Diagnosis not present

## 2022-07-26 DIAGNOSIS — Z8541 Personal history of malignant neoplasm of cervix uteri: Secondary | ICD-10-CM | POA: Diagnosis not present

## 2022-07-26 DIAGNOSIS — T84030D Mechanical loosening of internal right hip prosthetic joint, subsequent encounter: Secondary | ICD-10-CM | POA: Diagnosis not present

## 2022-07-26 DIAGNOSIS — R197 Diarrhea, unspecified: Secondary | ICD-10-CM | POA: Diagnosis not present

## 2022-07-26 DIAGNOSIS — N184 Chronic kidney disease, stage 4 (severe): Secondary | ICD-10-CM | POA: Diagnosis not present

## 2022-07-26 DIAGNOSIS — Z452 Encounter for adjustment and management of vascular access device: Secondary | ICD-10-CM | POA: Diagnosis not present

## 2022-07-26 DIAGNOSIS — Z90710 Acquired absence of both cervix and uterus: Secondary | ICD-10-CM | POA: Diagnosis not present

## 2022-07-26 DIAGNOSIS — I129 Hypertensive chronic kidney disease with stage 1 through stage 4 chronic kidney disease, or unspecified chronic kidney disease: Secondary | ICD-10-CM | POA: Diagnosis not present

## 2022-07-26 DIAGNOSIS — T84038A Mechanical loosening of other internal prosthetic joint, initial encounter: Secondary | ICD-10-CM | POA: Diagnosis not present

## 2022-07-27 ENCOUNTER — Telehealth: Payer: Self-pay | Admitting: *Deleted

## 2022-07-27 ENCOUNTER — Encounter: Payer: Self-pay | Admitting: *Deleted

## 2022-07-27 DIAGNOSIS — H25812 Combined forms of age-related cataract, left eye: Secondary | ICD-10-CM | POA: Diagnosis not present

## 2022-07-27 DIAGNOSIS — T84030D Mechanical loosening of internal right hip prosthetic joint, subsequent encounter: Secondary | ICD-10-CM | POA: Diagnosis not present

## 2022-07-27 DIAGNOSIS — I129 Hypertensive chronic kidney disease with stage 1 through stage 4 chronic kidney disease, or unspecified chronic kidney disease: Secondary | ICD-10-CM | POA: Diagnosis not present

## 2022-07-27 DIAGNOSIS — N184 Chronic kidney disease, stage 4 (severe): Secondary | ICD-10-CM | POA: Diagnosis not present

## 2022-07-27 DIAGNOSIS — Z96651 Presence of right artificial knee joint: Secondary | ICD-10-CM | POA: Diagnosis not present

## 2022-07-27 DIAGNOSIS — Z90710 Acquired absence of both cervix and uterus: Secondary | ICD-10-CM | POA: Diagnosis not present

## 2022-07-27 DIAGNOSIS — D62 Acute posthemorrhagic anemia: Secondary | ICD-10-CM | POA: Diagnosis not present

## 2022-07-27 DIAGNOSIS — Z452 Encounter for adjustment and management of vascular access device: Secondary | ICD-10-CM | POA: Diagnosis not present

## 2022-07-27 DIAGNOSIS — Z7689 Persons encountering health services in other specified circumstances: Secondary | ICD-10-CM | POA: Diagnosis not present

## 2022-07-27 DIAGNOSIS — Z9181 History of falling: Secondary | ICD-10-CM | POA: Diagnosis not present

## 2022-07-27 DIAGNOSIS — Z87442 Personal history of urinary calculi: Secondary | ICD-10-CM | POA: Diagnosis not present

## 2022-07-27 DIAGNOSIS — R32 Unspecified urinary incontinence: Secondary | ICD-10-CM | POA: Diagnosis not present

## 2022-07-27 DIAGNOSIS — Z8541 Personal history of malignant neoplasm of cervix uteri: Secondary | ICD-10-CM | POA: Diagnosis not present

## 2022-07-27 DIAGNOSIS — M0800 Unspecified juvenile rheumatoid arthritis of unspecified site: Secondary | ICD-10-CM | POA: Diagnosis not present

## 2022-07-27 DIAGNOSIS — Z7982 Long term (current) use of aspirin: Secondary | ICD-10-CM | POA: Diagnosis not present

## 2022-07-27 DIAGNOSIS — Z87891 Personal history of nicotine dependence: Secondary | ICD-10-CM | POA: Diagnosis not present

## 2022-07-27 DIAGNOSIS — Z87440 Personal history of urinary (tract) infections: Secondary | ICD-10-CM | POA: Diagnosis not present

## 2022-07-27 DIAGNOSIS — T84038A Mechanical loosening of other internal prosthetic joint, initial encounter: Secondary | ICD-10-CM | POA: Diagnosis not present

## 2022-07-27 DIAGNOSIS — T8451XA Infection and inflammatory reaction due to internal right hip prosthesis, initial encounter: Secondary | ICD-10-CM | POA: Diagnosis not present

## 2022-07-27 DIAGNOSIS — H2512 Age-related nuclear cataract, left eye: Secondary | ICD-10-CM | POA: Diagnosis not present

## 2022-07-27 DIAGNOSIS — Z96642 Presence of left artificial hip joint: Secondary | ICD-10-CM | POA: Diagnosis not present

## 2022-07-27 NOTE — Patient Outreach (Signed)
  Care Coordination [facility] Note Transition Care Management Unsuccessful Follow-up Telephone Call  Date of discharge and from where:  Friday, 11[facility]; prosthetic joint infection  Attempts:  1st Attempt  Reason for unsuccessful TCM follow-up call:  Left voice message  Oneta Rack, RN, BSN, CCRN Alumnus RN CM Care Coordination/ Transition of [place] Management [phone]: direct office

## 2022-07-27 NOTE — Patient Outreach (Signed)
  Care Coordination [facility] Note Transition Care Management Follow-up Telephone Call Date of discharge and from where: Friday, 07/20/22 [facility]; prosthetic joint infection with surgery How have you been since you were released from the hospital? Overall, I am doing the best I can; I developed severe diarrhea and they are working me up to see if I have C-Diff; I am taking the probiotics like they told me to and currently seeing my doctors at [facility] by zoom appointments, because the knee that got messed up during surgery makes it impossible for me to get in and out of a car right now.  My family is helping me with everything and anything that I need and I am scheduled to see the orthopedic surgeon on Monday 11/20- but that will likely turn into a video visit-- I am waiting to hear back from them now.  Home health nurse and PT are coming in and working with me.  I don't go to [facility] any more-- I switched my PCP a good while ago" Any questions or concerns? No  Items Reviewed: Did the pt receive and understand the discharge instructions provided? Yes  Medications obtained and verified? Yes  Other? No  Any new allergies since your discharge? No  Dietary orders reviewed? Yes Do you have support at home? Yes  family assisting with care needs as needed/ indicated  Home Care and Equipment/Supplies: Were home health services ordered? yes If so, what is the name of the agency? [facility]  Has the agency set up a time to come to the patient's home? yes Were any new equipment or medical supplies ordered?  No What is the name of the medical supply agency? N/A Were you able to get the supplies/equipment? not applicable Do you have any questions related to the use of the equipment or supplies? No N/A  Functional Questionnaire: (I = Independent and D = Dependent) ADLs:  I  family assisting with care needs as needed  Bathing/Dressing- I  family assisting with care needs as  needed  Meal Prep- I  family assisting with care needs as needed  Eating- I  Maintaining continence- I  Transferring/Ambulation- I  family assisting with care needs as needed  Managing Meds- I family assisting with care needs as needed   Follow up appointments reviewed:  PCP Hospital f/u appt confirmed? No  Scheduled to see -- on -- @ -- [facility] f/u appt confirmed? Yes  Scheduled to see Orthopedic surgical provider at [facility] on Monday 07/30/22 @ 08:00 am Are transportation arrangements needed? No  If their condition worsens, is the pt aware to call PCP or go to the Emergency Dept.? Yes Was the patient provided with contact information for the PCP's office or ED? Yes Was to pt encouraged to call back with questions or concerns? Yes  SDOH assessments and interventions completed:   Yes  Care Coordination Interventions Activated:  Yes   Care Coordination Interventions:  Discussed/ provided education around purpose and use of probiotics in setting of long use of antibiotics    Encounter Outcome:  Pt. Visit Completed    Oneta Rack, RN, BSN, CCRN Alumnus RN CM Care Coordination/ Transition of [place] Management [phone]: direct office

## 2022-07-28 DIAGNOSIS — T8450XS Infection and inflammatory reaction due to unspecified internal joint prosthesis, sequela: Secondary | ICD-10-CM | POA: Diagnosis not present

## 2022-07-30 DIAGNOSIS — M0800 Unspecified juvenile rheumatoid arthritis of unspecified site: Secondary | ICD-10-CM | POA: Diagnosis not present

## 2022-07-30 DIAGNOSIS — Z7982 Long term (current) use of aspirin: Secondary | ICD-10-CM | POA: Diagnosis not present

## 2022-07-30 DIAGNOSIS — T84030D Mechanical loosening of internal right hip prosthetic joint, subsequent encounter: Secondary | ICD-10-CM | POA: Diagnosis not present

## 2022-07-30 DIAGNOSIS — Z9181 History of falling: Secondary | ICD-10-CM | POA: Diagnosis not present

## 2022-07-30 DIAGNOSIS — Z87442 Personal history of urinary calculi: Secondary | ICD-10-CM | POA: Diagnosis not present

## 2022-07-30 DIAGNOSIS — N184 Chronic kidney disease, stage 4 (severe): Secondary | ICD-10-CM | POA: Diagnosis not present

## 2022-07-30 DIAGNOSIS — T84038A Mechanical loosening of other internal prosthetic joint, initial encounter: Secondary | ICD-10-CM | POA: Diagnosis not present

## 2022-07-30 DIAGNOSIS — T8451XA Infection and inflammatory reaction due to internal right hip prosthesis, initial encounter: Secondary | ICD-10-CM | POA: Diagnosis not present

## 2022-07-30 DIAGNOSIS — Z87891 Personal history of nicotine dependence: Secondary | ICD-10-CM | POA: Diagnosis not present

## 2022-07-30 DIAGNOSIS — Z8541 Personal history of malignant neoplasm of cervix uteri: Secondary | ICD-10-CM | POA: Diagnosis not present

## 2022-07-30 DIAGNOSIS — Z96642 Presence of left artificial hip joint: Secondary | ICD-10-CM | POA: Diagnosis not present

## 2022-07-30 DIAGNOSIS — Z87440 Personal history of urinary (tract) infections: Secondary | ICD-10-CM | POA: Diagnosis not present

## 2022-07-30 DIAGNOSIS — R32 Unspecified urinary incontinence: Secondary | ICD-10-CM | POA: Diagnosis not present

## 2022-07-30 DIAGNOSIS — H25812 Combined forms of age-related cataract, left eye: Secondary | ICD-10-CM | POA: Diagnosis not present

## 2022-07-30 DIAGNOSIS — Z90710 Acquired absence of both cervix and uterus: Secondary | ICD-10-CM | POA: Diagnosis not present

## 2022-07-30 DIAGNOSIS — I129 Hypertensive chronic kidney disease with stage 1 through stage 4 chronic kidney disease, or unspecified chronic kidney disease: Secondary | ICD-10-CM | POA: Diagnosis not present

## 2022-07-30 DIAGNOSIS — D62 Acute posthemorrhagic anemia: Secondary | ICD-10-CM | POA: Diagnosis not present

## 2022-07-30 DIAGNOSIS — Z452 Encounter for adjustment and management of vascular access device: Secondary | ICD-10-CM | POA: Diagnosis not present

## 2022-07-30 DIAGNOSIS — Z96651 Presence of right artificial knee joint: Secondary | ICD-10-CM | POA: Diagnosis not present

## 2022-07-30 DIAGNOSIS — H2512 Age-related nuclear cataract, left eye: Secondary | ICD-10-CM | POA: Diagnosis not present

## 2022-07-31 DIAGNOSIS — N184 Chronic kidney disease, stage 4 (severe): Secondary | ICD-10-CM | POA: Diagnosis not present

## 2022-07-31 DIAGNOSIS — Z96651 Presence of right artificial knee joint: Secondary | ICD-10-CM | POA: Diagnosis not present

## 2022-07-31 DIAGNOSIS — Z87891 Personal history of nicotine dependence: Secondary | ICD-10-CM | POA: Diagnosis not present

## 2022-07-31 DIAGNOSIS — H25812 Combined forms of age-related cataract, left eye: Secondary | ICD-10-CM | POA: Diagnosis not present

## 2022-07-31 DIAGNOSIS — Z452 Encounter for adjustment and management of vascular access device: Secondary | ICD-10-CM | POA: Diagnosis not present

## 2022-07-31 DIAGNOSIS — Z87440 Personal history of urinary (tract) infections: Secondary | ICD-10-CM | POA: Diagnosis not present

## 2022-07-31 DIAGNOSIS — T84038A Mechanical loosening of other internal prosthetic joint, initial encounter: Secondary | ICD-10-CM | POA: Diagnosis not present

## 2022-07-31 DIAGNOSIS — Z90710 Acquired absence of both cervix and uterus: Secondary | ICD-10-CM | POA: Diagnosis not present

## 2022-07-31 DIAGNOSIS — I129 Hypertensive chronic kidney disease with stage 1 through stage 4 chronic kidney disease, or unspecified chronic kidney disease: Secondary | ICD-10-CM | POA: Diagnosis not present

## 2022-07-31 DIAGNOSIS — Z8541 Personal history of malignant neoplasm of cervix uteri: Secondary | ICD-10-CM | POA: Diagnosis not present

## 2022-07-31 DIAGNOSIS — Z7982 Long term (current) use of aspirin: Secondary | ICD-10-CM | POA: Diagnosis not present

## 2022-07-31 DIAGNOSIS — H2512 Age-related nuclear cataract, left eye: Secondary | ICD-10-CM | POA: Diagnosis not present

## 2022-07-31 DIAGNOSIS — M0800 Unspecified juvenile rheumatoid arthritis of unspecified site: Secondary | ICD-10-CM | POA: Diagnosis not present

## 2022-07-31 DIAGNOSIS — Z9181 History of falling: Secondary | ICD-10-CM | POA: Diagnosis not present

## 2022-07-31 DIAGNOSIS — Z87442 Personal history of urinary calculi: Secondary | ICD-10-CM | POA: Diagnosis not present

## 2022-07-31 DIAGNOSIS — T84030D Mechanical loosening of internal right hip prosthetic joint, subsequent encounter: Secondary | ICD-10-CM | POA: Diagnosis not present

## 2022-07-31 DIAGNOSIS — R32 Unspecified urinary incontinence: Secondary | ICD-10-CM | POA: Diagnosis not present

## 2022-07-31 DIAGNOSIS — T8451XA Infection and inflammatory reaction due to internal right hip prosthesis, initial encounter: Secondary | ICD-10-CM | POA: Diagnosis not present

## 2022-07-31 DIAGNOSIS — D62 Acute posthemorrhagic anemia: Secondary | ICD-10-CM | POA: Diagnosis not present

## 2022-07-31 DIAGNOSIS — Z96642 Presence of left artificial hip joint: Secondary | ICD-10-CM | POA: Diagnosis not present

## 2022-08-01 DIAGNOSIS — D62 Acute posthemorrhagic anemia: Secondary | ICD-10-CM | POA: Diagnosis not present

## 2022-08-01 DIAGNOSIS — H2512 Age-related nuclear cataract, left eye: Secondary | ICD-10-CM | POA: Diagnosis not present

## 2022-08-01 DIAGNOSIS — Z87891 Personal history of nicotine dependence: Secondary | ICD-10-CM | POA: Diagnosis not present

## 2022-08-01 DIAGNOSIS — Z9181 History of falling: Secondary | ICD-10-CM | POA: Diagnosis not present

## 2022-08-01 DIAGNOSIS — M0800 Unspecified juvenile rheumatoid arthritis of unspecified site: Secondary | ICD-10-CM | POA: Diagnosis not present

## 2022-08-01 DIAGNOSIS — Z90710 Acquired absence of both cervix and uterus: Secondary | ICD-10-CM | POA: Diagnosis not present

## 2022-08-01 DIAGNOSIS — Z7982 Long term (current) use of aspirin: Secondary | ICD-10-CM | POA: Diagnosis not present

## 2022-08-01 DIAGNOSIS — Z452 Encounter for adjustment and management of vascular access device: Secondary | ICD-10-CM | POA: Diagnosis not present

## 2022-08-01 DIAGNOSIS — Z87442 Personal history of urinary calculi: Secondary | ICD-10-CM | POA: Diagnosis not present

## 2022-08-01 DIAGNOSIS — Z96651 Presence of right artificial knee joint: Secondary | ICD-10-CM | POA: Diagnosis not present

## 2022-08-01 DIAGNOSIS — T8451XA Infection and inflammatory reaction due to internal right hip prosthesis, initial encounter: Secondary | ICD-10-CM | POA: Diagnosis not present

## 2022-08-01 DIAGNOSIS — T8450XS Infection and inflammatory reaction due to unspecified internal joint prosthesis, sequela: Secondary | ICD-10-CM | POA: Diagnosis not present

## 2022-08-01 DIAGNOSIS — R32 Unspecified urinary incontinence: Secondary | ICD-10-CM | POA: Diagnosis not present

## 2022-08-01 DIAGNOSIS — T84030D Mechanical loosening of internal right hip prosthetic joint, subsequent encounter: Secondary | ICD-10-CM | POA: Diagnosis not present

## 2022-08-01 DIAGNOSIS — I129 Hypertensive chronic kidney disease with stage 1 through stage 4 chronic kidney disease, or unspecified chronic kidney disease: Secondary | ICD-10-CM | POA: Diagnosis not present

## 2022-08-01 DIAGNOSIS — Z96642 Presence of left artificial hip joint: Secondary | ICD-10-CM | POA: Diagnosis not present

## 2022-08-01 DIAGNOSIS — Z8541 Personal history of malignant neoplasm of cervix uteri: Secondary | ICD-10-CM | POA: Diagnosis not present

## 2022-08-01 DIAGNOSIS — N184 Chronic kidney disease, stage 4 (severe): Secondary | ICD-10-CM | POA: Diagnosis not present

## 2022-08-01 DIAGNOSIS — H25812 Combined forms of age-related cataract, left eye: Secondary | ICD-10-CM | POA: Diagnosis not present

## 2022-08-01 DIAGNOSIS — Z87440 Personal history of urinary (tract) infections: Secondary | ICD-10-CM | POA: Diagnosis not present

## 2022-08-01 DIAGNOSIS — T84038A Mechanical loosening of other internal prosthetic joint, initial encounter: Secondary | ICD-10-CM | POA: Diagnosis not present

## 2022-08-05 DIAGNOSIS — T8450XS Infection and inflammatory reaction due to unspecified internal joint prosthesis, sequela: Secondary | ICD-10-CM | POA: Diagnosis not present

## 2022-08-06 DIAGNOSIS — R32 Unspecified urinary incontinence: Secondary | ICD-10-CM | POA: Diagnosis not present

## 2022-08-06 DIAGNOSIS — T84038A Mechanical loosening of other internal prosthetic joint, initial encounter: Secondary | ICD-10-CM | POA: Diagnosis not present

## 2022-08-06 DIAGNOSIS — Z96642 Presence of left artificial hip joint: Secondary | ICD-10-CM | POA: Diagnosis not present

## 2022-08-06 DIAGNOSIS — H25812 Combined forms of age-related cataract, left eye: Secondary | ICD-10-CM | POA: Diagnosis not present

## 2022-08-06 DIAGNOSIS — N390 Urinary tract infection, site not specified: Secondary | ICD-10-CM | POA: Diagnosis not present

## 2022-08-06 DIAGNOSIS — M0800 Unspecified juvenile rheumatoid arthritis of unspecified site: Secondary | ICD-10-CM | POA: Diagnosis not present

## 2022-08-06 DIAGNOSIS — I129 Hypertensive chronic kidney disease with stage 1 through stage 4 chronic kidney disease, or unspecified chronic kidney disease: Secondary | ICD-10-CM | POA: Diagnosis not present

## 2022-08-06 DIAGNOSIS — Z90710 Acquired absence of both cervix and uterus: Secondary | ICD-10-CM | POA: Diagnosis not present

## 2022-08-06 DIAGNOSIS — Z87442 Personal history of urinary calculi: Secondary | ICD-10-CM | POA: Diagnosis not present

## 2022-08-06 DIAGNOSIS — Z87891 Personal history of nicotine dependence: Secondary | ICD-10-CM | POA: Diagnosis not present

## 2022-08-06 DIAGNOSIS — T8451XA Infection and inflammatory reaction due to internal right hip prosthesis, initial encounter: Secondary | ICD-10-CM | POA: Diagnosis not present

## 2022-08-06 DIAGNOSIS — Z9181 History of falling: Secondary | ICD-10-CM | POA: Diagnosis not present

## 2022-08-06 DIAGNOSIS — Z7982 Long term (current) use of aspirin: Secondary | ICD-10-CM | POA: Diagnosis not present

## 2022-08-06 DIAGNOSIS — N184 Chronic kidney disease, stage 4 (severe): Secondary | ICD-10-CM | POA: Diagnosis not present

## 2022-08-06 DIAGNOSIS — Z8541 Personal history of malignant neoplasm of cervix uteri: Secondary | ICD-10-CM | POA: Diagnosis not present

## 2022-08-06 DIAGNOSIS — Z452 Encounter for adjustment and management of vascular access device: Secondary | ICD-10-CM | POA: Diagnosis not present

## 2022-08-06 DIAGNOSIS — Z87440 Personal history of urinary (tract) infections: Secondary | ICD-10-CM | POA: Diagnosis not present

## 2022-08-06 DIAGNOSIS — Z96651 Presence of right artificial knee joint: Secondary | ICD-10-CM | POA: Diagnosis not present

## 2022-08-06 DIAGNOSIS — H2512 Age-related nuclear cataract, left eye: Secondary | ICD-10-CM | POA: Diagnosis not present

## 2022-08-06 DIAGNOSIS — T84030D Mechanical loosening of internal right hip prosthetic joint, subsequent encounter: Secondary | ICD-10-CM | POA: Diagnosis not present

## 2022-08-06 DIAGNOSIS — D62 Acute posthemorrhagic anemia: Secondary | ICD-10-CM | POA: Diagnosis not present

## 2022-08-08 DIAGNOSIS — Z452 Encounter for adjustment and management of vascular access device: Secondary | ICD-10-CM | POA: Diagnosis not present

## 2022-08-08 DIAGNOSIS — Z7982 Long term (current) use of aspirin: Secondary | ICD-10-CM | POA: Diagnosis not present

## 2022-08-08 DIAGNOSIS — R32 Unspecified urinary incontinence: Secondary | ICD-10-CM | POA: Diagnosis not present

## 2022-08-08 DIAGNOSIS — N184 Chronic kidney disease, stage 4 (severe): Secondary | ICD-10-CM | POA: Diagnosis not present

## 2022-08-08 DIAGNOSIS — Z96642 Presence of left artificial hip joint: Secondary | ICD-10-CM | POA: Diagnosis not present

## 2022-08-08 DIAGNOSIS — Z8541 Personal history of malignant neoplasm of cervix uteri: Secondary | ICD-10-CM | POA: Diagnosis not present

## 2022-08-08 DIAGNOSIS — D62 Acute posthemorrhagic anemia: Secondary | ICD-10-CM | POA: Diagnosis not present

## 2022-08-08 DIAGNOSIS — I129 Hypertensive chronic kidney disease with stage 1 through stage 4 chronic kidney disease, or unspecified chronic kidney disease: Secondary | ICD-10-CM | POA: Diagnosis not present

## 2022-08-08 DIAGNOSIS — Z9181 History of falling: Secondary | ICD-10-CM | POA: Diagnosis not present

## 2022-08-08 DIAGNOSIS — T8451XA Infection and inflammatory reaction due to internal right hip prosthesis, initial encounter: Secondary | ICD-10-CM | POA: Diagnosis not present

## 2022-08-08 DIAGNOSIS — Z87891 Personal history of nicotine dependence: Secondary | ICD-10-CM | POA: Diagnosis not present

## 2022-08-08 DIAGNOSIS — H25812 Combined forms of age-related cataract, left eye: Secondary | ICD-10-CM | POA: Diagnosis not present

## 2022-08-08 DIAGNOSIS — M0800 Unspecified juvenile rheumatoid arthritis of unspecified site: Secondary | ICD-10-CM | POA: Diagnosis not present

## 2022-08-08 DIAGNOSIS — Z90710 Acquired absence of both cervix and uterus: Secondary | ICD-10-CM | POA: Diagnosis not present

## 2022-08-08 DIAGNOSIS — T84030D Mechanical loosening of internal right hip prosthetic joint, subsequent encounter: Secondary | ICD-10-CM | POA: Diagnosis not present

## 2022-08-08 DIAGNOSIS — Z87440 Personal history of urinary (tract) infections: Secondary | ICD-10-CM | POA: Diagnosis not present

## 2022-08-08 DIAGNOSIS — Z96651 Presence of right artificial knee joint: Secondary | ICD-10-CM | POA: Diagnosis not present

## 2022-08-08 DIAGNOSIS — Z87442 Personal history of urinary calculi: Secondary | ICD-10-CM | POA: Diagnosis not present

## 2022-08-08 DIAGNOSIS — T84038A Mechanical loosening of other internal prosthetic joint, initial encounter: Secondary | ICD-10-CM | POA: Diagnosis not present

## 2022-08-08 DIAGNOSIS — H2512 Age-related nuclear cataract, left eye: Secondary | ICD-10-CM | POA: Diagnosis not present

## 2022-08-10 DIAGNOSIS — R32 Unspecified urinary incontinence: Secondary | ICD-10-CM | POA: Diagnosis not present

## 2022-08-10 DIAGNOSIS — H2512 Age-related nuclear cataract, left eye: Secondary | ICD-10-CM | POA: Diagnosis not present

## 2022-08-10 DIAGNOSIS — Z96651 Presence of right artificial knee joint: Secondary | ICD-10-CM | POA: Diagnosis not present

## 2022-08-10 DIAGNOSIS — T8451XA Infection and inflammatory reaction due to internal right hip prosthesis, initial encounter: Secondary | ICD-10-CM | POA: Diagnosis not present

## 2022-08-10 DIAGNOSIS — Z7982 Long term (current) use of aspirin: Secondary | ICD-10-CM | POA: Diagnosis not present

## 2022-08-10 DIAGNOSIS — Z87440 Personal history of urinary (tract) infections: Secondary | ICD-10-CM | POA: Diagnosis not present

## 2022-08-10 DIAGNOSIS — D62 Acute posthemorrhagic anemia: Secondary | ICD-10-CM | POA: Diagnosis not present

## 2022-08-10 DIAGNOSIS — T84038A Mechanical loosening of other internal prosthetic joint, initial encounter: Secondary | ICD-10-CM | POA: Diagnosis not present

## 2022-08-10 DIAGNOSIS — Z8541 Personal history of malignant neoplasm of cervix uteri: Secondary | ICD-10-CM | POA: Diagnosis not present

## 2022-08-10 DIAGNOSIS — Z87442 Personal history of urinary calculi: Secondary | ICD-10-CM | POA: Diagnosis not present

## 2022-08-10 DIAGNOSIS — Z452 Encounter for adjustment and management of vascular access device: Secondary | ICD-10-CM | POA: Diagnosis not present

## 2022-08-10 DIAGNOSIS — T84030D Mechanical loosening of internal right hip prosthetic joint, subsequent encounter: Secondary | ICD-10-CM | POA: Diagnosis not present

## 2022-08-10 DIAGNOSIS — Z96642 Presence of left artificial hip joint: Secondary | ICD-10-CM | POA: Diagnosis not present

## 2022-08-10 DIAGNOSIS — M0800 Unspecified juvenile rheumatoid arthritis of unspecified site: Secondary | ICD-10-CM | POA: Diagnosis not present

## 2022-08-10 DIAGNOSIS — Z9181 History of falling: Secondary | ICD-10-CM | POA: Diagnosis not present

## 2022-08-10 DIAGNOSIS — Z90710 Acquired absence of both cervix and uterus: Secondary | ICD-10-CM | POA: Diagnosis not present

## 2022-08-10 DIAGNOSIS — Z87891 Personal history of nicotine dependence: Secondary | ICD-10-CM | POA: Diagnosis not present

## 2022-08-10 DIAGNOSIS — N184 Chronic kidney disease, stage 4 (severe): Secondary | ICD-10-CM | POA: Diagnosis not present

## 2022-08-10 DIAGNOSIS — H25812 Combined forms of age-related cataract, left eye: Secondary | ICD-10-CM | POA: Diagnosis not present

## 2022-08-10 DIAGNOSIS — I129 Hypertensive chronic kidney disease with stage 1 through stage 4 chronic kidney disease, or unspecified chronic kidney disease: Secondary | ICD-10-CM | POA: Diagnosis not present

## 2022-08-11 DIAGNOSIS — T8450XS Infection and inflammatory reaction due to unspecified internal joint prosthesis, sequela: Secondary | ICD-10-CM | POA: Diagnosis not present

## 2022-08-14 DIAGNOSIS — Z7982 Long term (current) use of aspirin: Secondary | ICD-10-CM | POA: Diagnosis not present

## 2022-08-14 DIAGNOSIS — H2512 Age-related nuclear cataract, left eye: Secondary | ICD-10-CM | POA: Diagnosis not present

## 2022-08-14 DIAGNOSIS — T84030D Mechanical loosening of internal right hip prosthetic joint, subsequent encounter: Secondary | ICD-10-CM | POA: Diagnosis not present

## 2022-08-14 DIAGNOSIS — T84038A Mechanical loosening of other internal prosthetic joint, initial encounter: Secondary | ICD-10-CM | POA: Diagnosis not present

## 2022-08-14 DIAGNOSIS — Z792 Long term (current) use of antibiotics: Secondary | ICD-10-CM | POA: Diagnosis not present

## 2022-08-14 DIAGNOSIS — Z452 Encounter for adjustment and management of vascular access device: Secondary | ICD-10-CM | POA: Diagnosis not present

## 2022-08-14 DIAGNOSIS — T8450XD Infection and inflammatory reaction due to unspecified internal joint prosthesis, subsequent encounter: Secondary | ICD-10-CM | POA: Diagnosis not present

## 2022-08-14 DIAGNOSIS — Z96642 Presence of left artificial hip joint: Secondary | ICD-10-CM | POA: Diagnosis not present

## 2022-08-14 DIAGNOSIS — Z96651 Presence of right artificial knee joint: Secondary | ICD-10-CM | POA: Diagnosis not present

## 2022-08-14 DIAGNOSIS — D62 Acute posthemorrhagic anemia: Secondary | ICD-10-CM | POA: Diagnosis not present

## 2022-08-14 DIAGNOSIS — Z90710 Acquired absence of both cervix and uterus: Secondary | ICD-10-CM | POA: Diagnosis not present

## 2022-08-14 DIAGNOSIS — H25812 Combined forms of age-related cataract, left eye: Secondary | ICD-10-CM | POA: Diagnosis not present

## 2022-08-14 DIAGNOSIS — M0800 Unspecified juvenile rheumatoid arthritis of unspecified site: Secondary | ICD-10-CM | POA: Diagnosis not present

## 2022-08-14 DIAGNOSIS — I129 Hypertensive chronic kidney disease with stage 1 through stage 4 chronic kidney disease, or unspecified chronic kidney disease: Secondary | ICD-10-CM | POA: Diagnosis not present

## 2022-08-14 DIAGNOSIS — Z87891 Personal history of nicotine dependence: Secondary | ICD-10-CM | POA: Diagnosis not present

## 2022-08-14 DIAGNOSIS — T8451XA Infection and inflammatory reaction due to internal right hip prosthesis, initial encounter: Secondary | ICD-10-CM | POA: Diagnosis not present

## 2022-08-14 DIAGNOSIS — Z87442 Personal history of urinary calculi: Secondary | ICD-10-CM | POA: Diagnosis not present

## 2022-08-14 DIAGNOSIS — Z8541 Personal history of malignant neoplasm of cervix uteri: Secondary | ICD-10-CM | POA: Diagnosis not present

## 2022-08-14 DIAGNOSIS — N184 Chronic kidney disease, stage 4 (severe): Secondary | ICD-10-CM | POA: Diagnosis not present

## 2022-08-14 DIAGNOSIS — R32 Unspecified urinary incontinence: Secondary | ICD-10-CM | POA: Diagnosis not present

## 2022-08-14 DIAGNOSIS — Z87440 Personal history of urinary (tract) infections: Secondary | ICD-10-CM | POA: Diagnosis not present

## 2022-08-14 DIAGNOSIS — Z9181 History of falling: Secondary | ICD-10-CM | POA: Diagnosis not present

## 2022-08-14 DIAGNOSIS — G609 Hereditary and idiopathic neuropathy, unspecified: Secondary | ICD-10-CM | POA: Diagnosis not present

## 2022-08-15 DIAGNOSIS — H25812 Combined forms of age-related cataract, left eye: Secondary | ICD-10-CM | POA: Diagnosis not present

## 2022-08-15 DIAGNOSIS — Z9181 History of falling: Secondary | ICD-10-CM | POA: Diagnosis not present

## 2022-08-15 DIAGNOSIS — Z90710 Acquired absence of both cervix and uterus: Secondary | ICD-10-CM | POA: Diagnosis not present

## 2022-08-15 DIAGNOSIS — Z452 Encounter for adjustment and management of vascular access device: Secondary | ICD-10-CM | POA: Diagnosis not present

## 2022-08-15 DIAGNOSIS — Z87440 Personal history of urinary (tract) infections: Secondary | ICD-10-CM | POA: Diagnosis not present

## 2022-08-15 DIAGNOSIS — Z96642 Presence of left artificial hip joint: Secondary | ICD-10-CM | POA: Diagnosis not present

## 2022-08-15 DIAGNOSIS — M0800 Unspecified juvenile rheumatoid arthritis of unspecified site: Secondary | ICD-10-CM | POA: Diagnosis not present

## 2022-08-15 DIAGNOSIS — I129 Hypertensive chronic kidney disease with stage 1 through stage 4 chronic kidney disease, or unspecified chronic kidney disease: Secondary | ICD-10-CM | POA: Diagnosis not present

## 2022-08-15 DIAGNOSIS — T8451XA Infection and inflammatory reaction due to internal right hip prosthesis, initial encounter: Secondary | ICD-10-CM | POA: Diagnosis not present

## 2022-08-15 DIAGNOSIS — Z87442 Personal history of urinary calculi: Secondary | ICD-10-CM | POA: Diagnosis not present

## 2022-08-15 DIAGNOSIS — Z8541 Personal history of malignant neoplasm of cervix uteri: Secondary | ICD-10-CM | POA: Diagnosis not present

## 2022-08-15 DIAGNOSIS — R32 Unspecified urinary incontinence: Secondary | ICD-10-CM | POA: Diagnosis not present

## 2022-08-15 DIAGNOSIS — Z87891 Personal history of nicotine dependence: Secondary | ICD-10-CM | POA: Diagnosis not present

## 2022-08-15 DIAGNOSIS — T84038A Mechanical loosening of other internal prosthetic joint, initial encounter: Secondary | ICD-10-CM | POA: Diagnosis not present

## 2022-08-15 DIAGNOSIS — Z96651 Presence of right artificial knee joint: Secondary | ICD-10-CM | POA: Diagnosis not present

## 2022-08-15 DIAGNOSIS — Z7982 Long term (current) use of aspirin: Secondary | ICD-10-CM | POA: Diagnosis not present

## 2022-08-15 DIAGNOSIS — H2512 Age-related nuclear cataract, left eye: Secondary | ICD-10-CM | POA: Diagnosis not present

## 2022-08-15 DIAGNOSIS — T84030D Mechanical loosening of internal right hip prosthetic joint, subsequent encounter: Secondary | ICD-10-CM | POA: Diagnosis not present

## 2022-08-15 DIAGNOSIS — D62 Acute posthemorrhagic anemia: Secondary | ICD-10-CM | POA: Diagnosis not present

## 2022-08-15 DIAGNOSIS — N184 Chronic kidney disease, stage 4 (severe): Secondary | ICD-10-CM | POA: Diagnosis not present

## 2022-08-17 DIAGNOSIS — Z96642 Presence of left artificial hip joint: Secondary | ICD-10-CM | POA: Diagnosis not present

## 2022-08-17 DIAGNOSIS — T8451XA Infection and inflammatory reaction due to internal right hip prosthesis, initial encounter: Secondary | ICD-10-CM | POA: Diagnosis not present

## 2022-08-17 DIAGNOSIS — Z7982 Long term (current) use of aspirin: Secondary | ICD-10-CM | POA: Diagnosis not present

## 2022-08-17 DIAGNOSIS — H25812 Combined forms of age-related cataract, left eye: Secondary | ICD-10-CM | POA: Diagnosis not present

## 2022-08-17 DIAGNOSIS — Z9181 History of falling: Secondary | ICD-10-CM | POA: Diagnosis not present

## 2022-08-17 DIAGNOSIS — Z8541 Personal history of malignant neoplasm of cervix uteri: Secondary | ICD-10-CM | POA: Diagnosis not present

## 2022-08-17 DIAGNOSIS — I129 Hypertensive chronic kidney disease with stage 1 through stage 4 chronic kidney disease, or unspecified chronic kidney disease: Secondary | ICD-10-CM | POA: Diagnosis not present

## 2022-08-17 DIAGNOSIS — R32 Unspecified urinary incontinence: Secondary | ICD-10-CM | POA: Diagnosis not present

## 2022-08-17 DIAGNOSIS — N184 Chronic kidney disease, stage 4 (severe): Secondary | ICD-10-CM | POA: Diagnosis not present

## 2022-08-17 DIAGNOSIS — Z96649 Presence of unspecified artificial hip joint: Secondary | ICD-10-CM | POA: Diagnosis not present

## 2022-08-17 DIAGNOSIS — Z90710 Acquired absence of both cervix and uterus: Secondary | ICD-10-CM | POA: Diagnosis not present

## 2022-08-17 DIAGNOSIS — Z87442 Personal history of urinary calculi: Secondary | ICD-10-CM | POA: Diagnosis not present

## 2022-08-17 DIAGNOSIS — H2512 Age-related nuclear cataract, left eye: Secondary | ICD-10-CM | POA: Diagnosis not present

## 2022-08-17 DIAGNOSIS — Z87440 Personal history of urinary (tract) infections: Secondary | ICD-10-CM | POA: Diagnosis not present

## 2022-08-17 DIAGNOSIS — M0800 Unspecified juvenile rheumatoid arthritis of unspecified site: Secondary | ICD-10-CM | POA: Diagnosis not present

## 2022-08-17 DIAGNOSIS — T84030D Mechanical loosening of internal right hip prosthetic joint, subsequent encounter: Secondary | ICD-10-CM | POA: Diagnosis not present

## 2022-08-17 DIAGNOSIS — Z96651 Presence of right artificial knee joint: Secondary | ICD-10-CM | POA: Diagnosis not present

## 2022-08-17 DIAGNOSIS — Z87891 Personal history of nicotine dependence: Secondary | ICD-10-CM | POA: Diagnosis not present

## 2022-08-17 DIAGNOSIS — D62 Acute posthemorrhagic anemia: Secondary | ICD-10-CM | POA: Diagnosis not present

## 2022-08-17 DIAGNOSIS — Z452 Encounter for adjustment and management of vascular access device: Secondary | ICD-10-CM | POA: Diagnosis not present

## 2022-08-17 DIAGNOSIS — T84038A Mechanical loosening of other internal prosthetic joint, initial encounter: Secondary | ICD-10-CM | POA: Diagnosis not present

## 2022-08-20 DIAGNOSIS — R32 Unspecified urinary incontinence: Secondary | ICD-10-CM | POA: Diagnosis not present

## 2022-08-20 DIAGNOSIS — N184 Chronic kidney disease, stage 4 (severe): Secondary | ICD-10-CM | POA: Diagnosis not present

## 2022-08-20 DIAGNOSIS — Z87442 Personal history of urinary calculi: Secondary | ICD-10-CM | POA: Diagnosis not present

## 2022-08-20 DIAGNOSIS — Z452 Encounter for adjustment and management of vascular access device: Secondary | ICD-10-CM | POA: Diagnosis not present

## 2022-08-20 DIAGNOSIS — Z7982 Long term (current) use of aspirin: Secondary | ICD-10-CM | POA: Diagnosis not present

## 2022-08-20 DIAGNOSIS — Z9181 History of falling: Secondary | ICD-10-CM | POA: Diagnosis not present

## 2022-08-20 DIAGNOSIS — Z90710 Acquired absence of both cervix and uterus: Secondary | ICD-10-CM | POA: Diagnosis not present

## 2022-08-20 DIAGNOSIS — Z96651 Presence of right artificial knee joint: Secondary | ICD-10-CM | POA: Diagnosis not present

## 2022-08-20 DIAGNOSIS — Z96642 Presence of left artificial hip joint: Secondary | ICD-10-CM | POA: Diagnosis not present

## 2022-08-20 DIAGNOSIS — D62 Acute posthemorrhagic anemia: Secondary | ICD-10-CM | POA: Diagnosis not present

## 2022-08-20 DIAGNOSIS — M0800 Unspecified juvenile rheumatoid arthritis of unspecified site: Secondary | ICD-10-CM | POA: Diagnosis not present

## 2022-08-20 DIAGNOSIS — Z87440 Personal history of urinary (tract) infections: Secondary | ICD-10-CM | POA: Diagnosis not present

## 2022-08-20 DIAGNOSIS — H25812 Combined forms of age-related cataract, left eye: Secondary | ICD-10-CM | POA: Diagnosis not present

## 2022-08-20 DIAGNOSIS — Z8541 Personal history of malignant neoplasm of cervix uteri: Secondary | ICD-10-CM | POA: Diagnosis not present

## 2022-08-20 DIAGNOSIS — T8451XA Infection and inflammatory reaction due to internal right hip prosthesis, initial encounter: Secondary | ICD-10-CM | POA: Diagnosis not present

## 2022-08-20 DIAGNOSIS — Z87891 Personal history of nicotine dependence: Secondary | ICD-10-CM | POA: Diagnosis not present

## 2022-08-20 DIAGNOSIS — H2512 Age-related nuclear cataract, left eye: Secondary | ICD-10-CM | POA: Diagnosis not present

## 2022-08-20 DIAGNOSIS — I129 Hypertensive chronic kidney disease with stage 1 through stage 4 chronic kidney disease, or unspecified chronic kidney disease: Secondary | ICD-10-CM | POA: Diagnosis not present

## 2022-08-20 DIAGNOSIS — T84030D Mechanical loosening of internal right hip prosthetic joint, subsequent encounter: Secondary | ICD-10-CM | POA: Diagnosis not present

## 2022-08-20 DIAGNOSIS — T84038A Mechanical loosening of other internal prosthetic joint, initial encounter: Secondary | ICD-10-CM | POA: Diagnosis not present

## 2022-08-21 DIAGNOSIS — Z96643 Presence of artificial hip joint, bilateral: Secondary | ICD-10-CM | POA: Diagnosis not present

## 2022-08-21 DIAGNOSIS — Z471 Aftercare following joint replacement surgery: Secondary | ICD-10-CM | POA: Diagnosis not present

## 2022-08-21 DIAGNOSIS — Z96649 Presence of unspecified artificial hip joint: Secondary | ICD-10-CM | POA: Diagnosis not present

## 2022-08-21 DIAGNOSIS — M85851 Other specified disorders of bone density and structure, right thigh: Secondary | ICD-10-CM | POA: Diagnosis not present

## 2022-08-22 DIAGNOSIS — R197 Diarrhea, unspecified: Secondary | ICD-10-CM | POA: Diagnosis not present

## 2022-08-23 DIAGNOSIS — Z87440 Personal history of urinary (tract) infections: Secondary | ICD-10-CM | POA: Diagnosis not present

## 2022-08-23 DIAGNOSIS — Z96642 Presence of left artificial hip joint: Secondary | ICD-10-CM | POA: Diagnosis not present

## 2022-08-23 DIAGNOSIS — Z792 Long term (current) use of antibiotics: Secondary | ICD-10-CM | POA: Diagnosis not present

## 2022-08-23 DIAGNOSIS — Z96641 Presence of right artificial hip joint: Secondary | ICD-10-CM | POA: Diagnosis not present

## 2022-08-23 DIAGNOSIS — I129 Hypertensive chronic kidney disease with stage 1 through stage 4 chronic kidney disease, or unspecified chronic kidney disease: Secondary | ICD-10-CM | POA: Diagnosis not present

## 2022-08-23 DIAGNOSIS — Z87891 Personal history of nicotine dependence: Secondary | ICD-10-CM | POA: Diagnosis not present

## 2022-08-23 DIAGNOSIS — N3946 Mixed incontinence: Secondary | ICD-10-CM | POA: Diagnosis not present

## 2022-08-23 DIAGNOSIS — Z7982 Long term (current) use of aspirin: Secondary | ICD-10-CM | POA: Diagnosis not present

## 2022-08-23 DIAGNOSIS — N184 Chronic kidney disease, stage 4 (severe): Secondary | ICD-10-CM | POA: Diagnosis not present

## 2022-08-23 DIAGNOSIS — T84030D Mechanical loosening of internal right hip prosthetic joint, subsequent encounter: Secondary | ICD-10-CM | POA: Diagnosis not present

## 2022-08-23 DIAGNOSIS — Z8541 Personal history of malignant neoplasm of cervix uteri: Secondary | ICD-10-CM | POA: Diagnosis not present

## 2022-08-23 DIAGNOSIS — Z96651 Presence of right artificial knee joint: Secondary | ICD-10-CM | POA: Diagnosis not present

## 2022-08-23 DIAGNOSIS — Z87442 Personal history of urinary calculi: Secondary | ICD-10-CM | POA: Diagnosis not present

## 2022-08-23 DIAGNOSIS — D62 Acute posthemorrhagic anemia: Secondary | ICD-10-CM | POA: Diagnosis not present

## 2022-08-23 DIAGNOSIS — Z452 Encounter for adjustment and management of vascular access device: Secondary | ICD-10-CM | POA: Diagnosis not present

## 2022-08-23 DIAGNOSIS — T8451XD Infection and inflammatory reaction due to internal right hip prosthesis, subsequent encounter: Secondary | ICD-10-CM | POA: Diagnosis not present

## 2022-08-23 DIAGNOSIS — M0800 Unspecified juvenile rheumatoid arthritis of unspecified site: Secondary | ICD-10-CM | POA: Diagnosis not present

## 2022-08-23 DIAGNOSIS — H25812 Combined forms of age-related cataract, left eye: Secondary | ICD-10-CM | POA: Diagnosis not present

## 2022-08-23 DIAGNOSIS — Z9181 History of falling: Secondary | ICD-10-CM | POA: Diagnosis not present

## 2022-08-28 ENCOUNTER — Emergency Department
Admission: EM | Admit: 2022-08-28 | Discharge: 2022-08-28 | Disposition: A | Discharge status: Home / Self Care | Payer: Medicare HMO | Attending: Emergency Medicine | Admitting: Emergency Medicine

## 2022-08-28 ENCOUNTER — Other Ambulatory Visit: Payer: Self-pay

## 2022-08-28 DIAGNOSIS — Z96641 Presence of right artificial hip joint: Secondary | ICD-10-CM | POA: Insufficient documentation

## 2022-08-28 DIAGNOSIS — R112 Nausea with vomiting, unspecified: Secondary | ICD-10-CM | POA: Diagnosis not present

## 2022-08-28 DIAGNOSIS — R000 Tachycardia, unspecified: Secondary | ICD-10-CM | POA: Diagnosis not present

## 2022-08-28 DIAGNOSIS — R110 Nausea: Secondary | ICD-10-CM | POA: Diagnosis not present

## 2022-08-28 DIAGNOSIS — E860 Dehydration: Secondary | ICD-10-CM | POA: Insufficient documentation

## 2022-08-28 DIAGNOSIS — Z20822 Contact with and (suspected) exposure to covid-19: Secondary | ICD-10-CM | POA: Insufficient documentation

## 2022-08-28 DIAGNOSIS — R197 Diarrhea, unspecified: Secondary | ICD-10-CM

## 2022-08-28 DIAGNOSIS — Z743 Need for continuous supervision: Secondary | ICD-10-CM | POA: Diagnosis not present

## 2022-08-28 LAB — COMPREHENSIVE METABOLIC PANEL
ALT: 20 U/L (ref 0–44)
AST: 33 U/L (ref 15–41)
Albumin: 3.3 g/dL — ABNORMAL LOW (ref 3.5–5.0)
Alkaline Phosphatase: 147 U/L — ABNORMAL HIGH (ref 38–126)
Anion gap: 9 (ref 5–15)
BUN: 32 mg/dL — ABNORMAL HIGH (ref 8–23)
CO2: 19 mmol/L — ABNORMAL LOW (ref 22–32)
Calcium: 8.9 mg/dL (ref 8.9–10.3)
Chloride: 110 mmol/L (ref 98–111)
Creatinine, Ser: 1.28 mg/dL — ABNORMAL HIGH (ref 0.44–1.00)
GFR, Estimated: 45 mL/min — ABNORMAL LOW (ref >60)
Glucose, Bld: 97 mg/dL (ref 70–99)
Potassium: 4.5 mmol/L (ref 3.5–5.1)
Sodium: 138 mmol/L (ref 135–145)
Total Bilirubin: 1.1 mg/dL (ref 0.3–1.2)
Total Protein: 7 g/dL (ref 6.5–8.1)

## 2022-08-28 LAB — CBC WITH DIFFERENTIAL/PLATELET
Abs Immature Granulocytes: 0.05 10*3/uL (ref 0.00–0.07)
Basophils Absolute: 0.1 10*3/uL (ref 0.0–0.1)
Basophils Relative: 1 %
Eosinophils Absolute: 0.1 10*3/uL (ref 0.0–0.5)
Eosinophils Relative: 1 %
HCT: 42.3 % (ref 36.0–46.0)
Hemoglobin: 12.9 g/dL (ref 12.0–15.0)
Immature Granulocytes: 1 %
Lymphocytes Relative: 6 %
Lymphs Abs: 0.6 10*3/uL — ABNORMAL LOW (ref 0.7–4.0)
MCH: 29 pg (ref 26.0–34.0)
MCHC: 30.5 g/dL (ref 30.0–36.0)
MCV: 95.1 fL (ref 80.0–100.0)
Monocytes Absolute: 0.4 10*3/uL (ref 0.1–1.0)
Monocytes Relative: 4 %
Neutro Abs: 9.1 10*3/uL — ABNORMAL HIGH (ref 1.7–7.7)
Neutrophils Relative %: 87 %
Platelets: 161 10*3/uL (ref 150–400)
RBC: 4.45 MIL/uL (ref 3.87–5.11)
RDW: 13.6 % (ref 11.5–15.5)
Smear Review: NORMAL
WBC: 10.3 10*3/uL (ref 4.0–10.5)
nRBC: 0 % (ref 0.0–0.2)

## 2022-08-28 LAB — RESP PANEL BY RT-PCR (RSV, FLU A&B, COVID)  RVPGX2
Influenza A by PCR: NEGATIVE
Influenza B by PCR: NEGATIVE
Resp Syncytial Virus by PCR: NEGATIVE
SARS Coronavirus 2 by RT PCR: NEGATIVE

## 2022-08-28 LAB — LIPASE, BLOOD: Lipase: 35 U/L (ref 11–51)

## 2022-08-28 MED ORDER — ONDANSETRON HCL 4 MG/2ML IJ SOLN
4.0000 mg | Freq: Once | INTRAMUSCULAR | Status: AC
  Administered 2022-08-28: 4 mg via INTRAVENOUS
  Filled 2022-08-28: qty 2

## 2022-08-28 MED ORDER — ONDANSETRON 8 MG PO TBDP: 8.0000 mg | ORAL_TABLET | Freq: Three times a day (TID) | ORAL | 0 refills | Status: DC | PRN

## 2022-08-28 MED ORDER — SODIUM CHLORIDE 0.9 % IV BOLUS
1000.0000 mL | Freq: Once | INTRAVENOUS | Status: AC
  Administered 2022-08-28: 1000 mL via INTRAVENOUS

## 2022-08-28 NOTE — ED Notes (Addendum)
Called Infectious Disease at [facility] spoke w/ Davy Pique and transferred call to Cheri Fowler , MD. Phone called actually made @ 20:48

## 2022-08-28 NOTE — ED Triage Notes (Signed)
Pt comes in via [facility] with complaints of nausea severe nausea, and diarrhea. Pt stated that she started taking oral antibiotics about a week and a half ago, and has been experiencing severe diarrhea, nausea the past week.   Pt is on the antibiotics due to complications she had with a hip replacement back in October 23. Pt is alert and oriented x 4, and hasn't taken anymore antibiotics since Saturday.

## 2022-08-28 NOTE — ED Provider Notes (Signed)
[facility] Provider Note   Event Date/Time   First MD Initiated Contact with Patient 08/28/22 1856     (approximate) History  Diarrhea (na), Nausea, and Emesis  HPI Jeanne [name] is a 71 y.o. 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  Patient states that she is worried this is due to the multiple antibiotics that she is on.  Patient states that she had an incalculable amount of diarrhea yesterday however is only at 3-4 episodes so far today after taking loperamide.  Patient denies any recent travel or sick contacts ROS: Patient currently denies any vision changes, tinnitus, difficulty speaking, facial droop, sore throat, chest pain, shortness of breath, abdominal pain,  dysuria, or numbness/paresthesias in any extremity   Physical Exam  Triage Vital Signs: ED Triage Vitals  Enc Vitals Group     BP 08/28/22 1751 131/69     Pulse Rate 08/28/22 1751 92     Resp 08/28/22 1751 16     Temp 08/28/22 1751 98.3 F (36.8 C)     Temp Source 08/28/22 1751 Oral     SpO2 08/28/22 1751 95 %     Weight 08/28/22 1755 169 lb (76.7 kg)     Height 08/28/22 1755 '5\' 4"'$  (1.626 m)     Head Circumference --      Peak Flow --      Pain Score 08/28/22 1754 7     Pain Loc --      Pain Edu? --      Excl. in [place]? --    Most recent vital signs: Vitals:   08/28/22 1751  BP: 131/69  Pulse: 92  Resp: 16  Temp: 98.3 F (36.8 C)  SpO2: 95%   General: Awake, oriented x4. CV:  Good peripheral perfusion.  Resp:  Normal effort.  Abd:  No distention.  Other:  Elderly Caucasian female laying in bed in no acute distress ED Results / Procedures / Treatments  Labs (all labs ordered are listed, but only abnormal results are displayed) Labs Reviewed  CBC WITH DIFFERENTIAL/PLATELET - Abnormal; Notable  for the following components:      Result Value   Neutro Abs 9.1 (*)    Lymphs Abs 0.6 (*)    All other components within normal limits  COMPREHENSIVE METABOLIC PANEL - Abnormal; Notable for the following components:   CO2 19 (*)    BUN 32 (*)    Creatinine, Ser 1.28 (*)    Albumin 3.3 (*)    Alkaline Phosphatase 147 (*)    GFR, Estimated 45 (*)    All other components within normal limits  RESP PANEL BY RT-PCR (RSV, FLU A&B, COVID)  RVPGX2  C DIFFICILE QUICK SCREEN W PCR REFLEX    LIPASE, BLOOD  URINALYSIS, ROUTINE W REFLEX MICROSCOPIC  PROCEDURES: Critical Care performed: No .1-3 Lead EKG Interpretation  Performed by: Naaman Plummer, MD Authorized by: Naaman Plummer, MD     Interpretation: normal     ECG rate:  71   ECG rate assessment: normal     Rhythm: sinus rhythm     Ectopy: none     Conduction: normal    MEDICATIONS ORDERED IN ED: Medications  sodium chloride 0.9 % bolus 1,000 mL (0 mLs Intravenous Stopped 08/28/22 2320)  ondansetron (ZOFRAN) injection 4 mg (4  mg Intravenous Given 08/28/22 2010)   IMPRESSION / MDM / ASSESSMENT AND PLAN / ED COURSE  I reviewed the triage vital signs and the nursing notes.                             The patient is on the cardiac monitor to evaluate for evidence of arrhythmia and/or significant heart rate changes. Patient's presentation is most consistent with acute presentation with potential threat to life or bodily function. This patient presents with diarrhea consistent with likely viral enteritis. Doubt acute bacterial diarrhea. Considered, but think unlikely, partial SBO, appendicitis, diverticulitis, other intraabdominal infection. Low suspicion for secondary causes of diarrhea such as hyperadrenergic state, pheo, adrenal crisis, hyperthyroidism, or sepsis. Doubt antibiotic associated diarrhea.  Plan: PO rehydration, reassess, discharge with OTC antidiarrheal meds//short course antibiotics  Dispo: Discharge home with PCP  follow-up and strict return precautions   FINAL CLINICAL IMPRESSION(S) / ED DIAGNOSES   Final diagnoses:  Diarrhea, unspecified type  Nausea vomiting and diarrhea  Dehydration   Rx / DC Orders   ED Discharge Orders          Ordered    ondansetron (ZOFRAN-ODT) 8 MG disintegrating tablet  Every 8 hours PRN        08/28/22 2149           Note:  This document was prepared using Dragon voice recognition software and may include unintentional dictation errors.   Naaman Plummer, MD 08/29/22 0000

## 2022-08-28 NOTE — ED Notes (Signed)
First nurse-pt brought in via ems from home with v/d for 3 days   no appetite.  Recent hip replacement.  Bp 140/80, p100, sats 100% per ems.  Cbg 120 per ems.

## 2022-08-28 NOTE — ED Notes (Signed)
Pt updated again that we do not have a timeframe for her to be picked up. That transport will show up when they have availability. Pt denies any other complaints at this time.

## 2022-08-28 NOTE — ED Provider Triage Note (Signed)
Emergency Medicine Provider Triage Evaluation Note  Jeanne [name] , a 71 y.o. female  was evaluated in triage.  Pt complains of diarrhea since this weekend. Had an admission for prosthetic joint infection (currently 7 weeks post op R conv [facility]) and had antibiotics through PICC line. Reports that she has been on 1.5 weeks of oral abx but can remember the name. Reports nausea and diarrhea. No vomiting, no fever. Reports her joint is doing well. C dif was negative  Review of Systems  Positive: Diarrhea, nausea Negative: fever  Physical Exam  There were no vitals taken for this visit. Gen:   Awake, no distress  Resp:  Normal effort  MSK:   Moves extremities without difficulty  Other:    Medical Decision Making  Medically screening exam initiated at 5:48 PM.  Appropriate orders placed.  Jeanne [name] was informed that the remainder of the evaluation will be completed by another provider, this initial triage assessment does not replace that evaluation, and the importance of remaining in the ED until their evaluation is complete.     Marquette Old, PA-C 08/28/22 1758

## 2022-08-28 NOTE — ED Notes (Signed)
Pt aware that they have to transport back home tomorrow morning and they are okay with this. She states "there is no way I can get  up, I tried going to my appts by my husband carrying me to my car and it was a diaster".

## 2022-08-28 NOTE — ED Notes (Signed)
Pt passed PO challenge. Dr. Cheri Fowler aware.

## 2022-08-28 NOTE — ED Notes (Signed)
This RN now assuming care of pt until transport arrives.

## 2022-08-29 DIAGNOSIS — R531 Weakness: Secondary | ICD-10-CM | POA: Diagnosis not present

## 2022-08-29 DIAGNOSIS — Z743 Need for continuous supervision: Secondary | ICD-10-CM | POA: Diagnosis not present

## 2022-08-29 DIAGNOSIS — Z7401 Bed confinement status: Secondary | ICD-10-CM | POA: Diagnosis not present

## 2022-08-29 NOTE — ED Notes (Signed)
Signing pad not available. Pt verbalized understanding of Dc instructions.

## 2022-08-31 DIAGNOSIS — Z8541 Personal history of malignant neoplasm of cervix uteri: Secondary | ICD-10-CM | POA: Diagnosis not present

## 2022-08-31 DIAGNOSIS — Z452 Encounter for adjustment and management of vascular access device: Secondary | ICD-10-CM | POA: Diagnosis not present

## 2022-08-31 DIAGNOSIS — N184 Chronic kidney disease, stage 4 (severe): Secondary | ICD-10-CM | POA: Diagnosis not present

## 2022-08-31 DIAGNOSIS — H25812 Combined forms of age-related cataract, left eye: Secondary | ICD-10-CM | POA: Diagnosis not present

## 2022-08-31 DIAGNOSIS — Z96641 Presence of right artificial hip joint: Secondary | ICD-10-CM | POA: Diagnosis not present

## 2022-08-31 DIAGNOSIS — I129 Hypertensive chronic kidney disease with stage 1 through stage 4 chronic kidney disease, or unspecified chronic kidney disease: Secondary | ICD-10-CM | POA: Diagnosis not present

## 2022-08-31 DIAGNOSIS — Z7982 Long term (current) use of aspirin: Secondary | ICD-10-CM | POA: Diagnosis not present

## 2022-08-31 DIAGNOSIS — T84030D Mechanical loosening of internal right hip prosthetic joint, subsequent encounter: Secondary | ICD-10-CM | POA: Diagnosis not present

## 2022-08-31 DIAGNOSIS — Z87891 Personal history of nicotine dependence: Secondary | ICD-10-CM | POA: Diagnosis not present

## 2022-08-31 DIAGNOSIS — T8451XD Infection and inflammatory reaction due to internal right hip prosthesis, subsequent encounter: Secondary | ICD-10-CM | POA: Diagnosis not present

## 2022-08-31 DIAGNOSIS — Z96651 Presence of right artificial knee joint: Secondary | ICD-10-CM | POA: Diagnosis not present

## 2022-08-31 DIAGNOSIS — Z792 Long term (current) use of antibiotics: Secondary | ICD-10-CM | POA: Diagnosis not present

## 2022-08-31 DIAGNOSIS — N3946 Mixed incontinence: Secondary | ICD-10-CM | POA: Diagnosis not present

## 2022-08-31 DIAGNOSIS — M0800 Unspecified juvenile rheumatoid arthritis of unspecified site: Secondary | ICD-10-CM | POA: Diagnosis not present

## 2022-08-31 DIAGNOSIS — Z87440 Personal history of urinary (tract) infections: Secondary | ICD-10-CM | POA: Diagnosis not present

## 2022-08-31 DIAGNOSIS — D62 Acute posthemorrhagic anemia: Secondary | ICD-10-CM | POA: Diagnosis not present

## 2022-08-31 DIAGNOSIS — Z87442 Personal history of urinary calculi: Secondary | ICD-10-CM | POA: Diagnosis not present

## 2022-08-31 DIAGNOSIS — Z9181 History of falling: Secondary | ICD-10-CM | POA: Diagnosis not present

## 2022-08-31 DIAGNOSIS — Z96642 Presence of left artificial hip joint: Secondary | ICD-10-CM | POA: Diagnosis not present

## 2022-09-05 DIAGNOSIS — Z96651 Presence of right artificial knee joint: Secondary | ICD-10-CM | POA: Diagnosis not present

## 2022-09-05 DIAGNOSIS — T84030D Mechanical loosening of internal right hip prosthetic joint, subsequent encounter: Secondary | ICD-10-CM | POA: Diagnosis not present

## 2022-09-05 DIAGNOSIS — Z96642 Presence of left artificial hip joint: Secondary | ICD-10-CM | POA: Diagnosis not present

## 2022-09-05 DIAGNOSIS — M0800 Unspecified juvenile rheumatoid arthritis of unspecified site: Secondary | ICD-10-CM | POA: Diagnosis not present

## 2022-09-05 DIAGNOSIS — Z87440 Personal history of urinary (tract) infections: Secondary | ICD-10-CM | POA: Diagnosis not present

## 2022-09-05 DIAGNOSIS — Z96641 Presence of right artificial hip joint: Secondary | ICD-10-CM | POA: Diagnosis not present

## 2022-09-05 DIAGNOSIS — Z452 Encounter for adjustment and management of vascular access device: Secondary | ICD-10-CM | POA: Diagnosis not present

## 2022-09-05 DIAGNOSIS — I129 Hypertensive chronic kidney disease with stage 1 through stage 4 chronic kidney disease, or unspecified chronic kidney disease: Secondary | ICD-10-CM | POA: Diagnosis not present

## 2022-09-05 DIAGNOSIS — Z87891 Personal history of nicotine dependence: Secondary | ICD-10-CM | POA: Diagnosis not present

## 2022-09-05 DIAGNOSIS — N3946 Mixed incontinence: Secondary | ICD-10-CM | POA: Diagnosis not present

## 2022-09-05 DIAGNOSIS — T8451XD Infection and inflammatory reaction due to internal right hip prosthesis, subsequent encounter: Secondary | ICD-10-CM | POA: Diagnosis not present

## 2022-09-05 DIAGNOSIS — Z87442 Personal history of urinary calculi: Secondary | ICD-10-CM | POA: Diagnosis not present

## 2022-09-05 DIAGNOSIS — Z9181 History of falling: Secondary | ICD-10-CM | POA: Diagnosis not present

## 2022-09-05 DIAGNOSIS — N184 Chronic kidney disease, stage 4 (severe): Secondary | ICD-10-CM | POA: Diagnosis not present

## 2022-09-05 DIAGNOSIS — Z8541 Personal history of malignant neoplasm of cervix uteri: Secondary | ICD-10-CM | POA: Diagnosis not present

## 2022-09-05 DIAGNOSIS — D62 Acute posthemorrhagic anemia: Secondary | ICD-10-CM | POA: Diagnosis not present

## 2022-09-05 DIAGNOSIS — Z7982 Long term (current) use of aspirin: Secondary | ICD-10-CM | POA: Diagnosis not present

## 2022-09-05 DIAGNOSIS — Z792 Long term (current) use of antibiotics: Secondary | ICD-10-CM | POA: Diagnosis not present

## 2022-09-05 DIAGNOSIS — H25812 Combined forms of age-related cataract, left eye: Secondary | ICD-10-CM | POA: Diagnosis not present

## 2022-09-11 DIAGNOSIS — Z96642 Presence of left artificial hip joint: Secondary | ICD-10-CM | POA: Diagnosis not present

## 2022-09-11 DIAGNOSIS — D62 Acute posthemorrhagic anemia: Secondary | ICD-10-CM | POA: Diagnosis not present

## 2022-09-11 DIAGNOSIS — Z7982 Long term (current) use of aspirin: Secondary | ICD-10-CM | POA: Diagnosis not present

## 2022-09-11 DIAGNOSIS — T84030D Mechanical loosening of internal right hip prosthetic joint, subsequent encounter: Secondary | ICD-10-CM | POA: Diagnosis not present

## 2022-09-11 DIAGNOSIS — Z8541 Personal history of malignant neoplasm of cervix uteri: Secondary | ICD-10-CM | POA: Diagnosis not present

## 2022-09-11 DIAGNOSIS — H25812 Combined forms of age-related cataract, left eye: Secondary | ICD-10-CM | POA: Diagnosis not present

## 2022-09-11 DIAGNOSIS — Z9181 History of falling: Secondary | ICD-10-CM | POA: Diagnosis not present

## 2022-09-11 DIAGNOSIS — T8451XD Infection and inflammatory reaction due to internal right hip prosthesis, subsequent encounter: Secondary | ICD-10-CM | POA: Diagnosis not present

## 2022-09-11 DIAGNOSIS — Z96651 Presence of right artificial knee joint: Secondary | ICD-10-CM | POA: Diagnosis not present

## 2022-09-11 DIAGNOSIS — Z792 Long term (current) use of antibiotics: Secondary | ICD-10-CM | POA: Diagnosis not present

## 2022-09-11 DIAGNOSIS — Z87440 Personal history of urinary (tract) infections: Secondary | ICD-10-CM | POA: Diagnosis not present

## 2022-09-11 DIAGNOSIS — Z452 Encounter for adjustment and management of vascular access device: Secondary | ICD-10-CM | POA: Diagnosis not present

## 2022-09-11 DIAGNOSIS — Z87442 Personal history of urinary calculi: Secondary | ICD-10-CM | POA: Diagnosis not present

## 2022-09-11 DIAGNOSIS — N3946 Mixed incontinence: Secondary | ICD-10-CM | POA: Diagnosis not present

## 2022-09-11 DIAGNOSIS — Z96641 Presence of right artificial hip joint: Secondary | ICD-10-CM | POA: Diagnosis not present

## 2022-09-11 DIAGNOSIS — M0800 Unspecified juvenile rheumatoid arthritis of unspecified site: Secondary | ICD-10-CM | POA: Diagnosis not present

## 2022-09-11 DIAGNOSIS — N184 Chronic kidney disease, stage 4 (severe): Secondary | ICD-10-CM | POA: Diagnosis not present

## 2022-09-11 DIAGNOSIS — I129 Hypertensive chronic kidney disease with stage 1 through stage 4 chronic kidney disease, or unspecified chronic kidney disease: Secondary | ICD-10-CM | POA: Diagnosis not present

## 2022-09-11 DIAGNOSIS — Z87891 Personal history of nicotine dependence: Secondary | ICD-10-CM | POA: Diagnosis not present

## 2022-09-14 DIAGNOSIS — Z9181 History of falling: Secondary | ICD-10-CM | POA: Diagnosis not present

## 2022-09-14 DIAGNOSIS — Z87891 Personal history of nicotine dependence: Secondary | ICD-10-CM | POA: Diagnosis not present

## 2022-09-14 DIAGNOSIS — Z452 Encounter for adjustment and management of vascular access device: Secondary | ICD-10-CM | POA: Diagnosis not present

## 2022-09-14 DIAGNOSIS — T84030D Mechanical loosening of internal right hip prosthetic joint, subsequent encounter: Secondary | ICD-10-CM | POA: Diagnosis not present

## 2022-09-14 DIAGNOSIS — Z96642 Presence of left artificial hip joint: Secondary | ICD-10-CM | POA: Diagnosis not present

## 2022-09-14 DIAGNOSIS — Z96651 Presence of right artificial knee joint: Secondary | ICD-10-CM | POA: Diagnosis not present

## 2022-09-14 DIAGNOSIS — Z8541 Personal history of malignant neoplasm of cervix uteri: Secondary | ICD-10-CM | POA: Diagnosis not present

## 2022-09-14 DIAGNOSIS — Z792 Long term (current) use of antibiotics: Secondary | ICD-10-CM | POA: Diagnosis not present

## 2022-09-14 DIAGNOSIS — N184 Chronic kidney disease, stage 4 (severe): Secondary | ICD-10-CM | POA: Diagnosis not present

## 2022-09-14 DIAGNOSIS — T8451XD Infection and inflammatory reaction due to internal right hip prosthesis, subsequent encounter: Secondary | ICD-10-CM | POA: Diagnosis not present

## 2022-09-14 DIAGNOSIS — Z96641 Presence of right artificial hip joint: Secondary | ICD-10-CM | POA: Diagnosis not present

## 2022-09-14 DIAGNOSIS — Z7982 Long term (current) use of aspirin: Secondary | ICD-10-CM | POA: Diagnosis not present

## 2022-09-14 DIAGNOSIS — Z87440 Personal history of urinary (tract) infections: Secondary | ICD-10-CM | POA: Diagnosis not present

## 2022-09-14 DIAGNOSIS — N3946 Mixed incontinence: Secondary | ICD-10-CM | POA: Diagnosis not present

## 2022-09-14 DIAGNOSIS — Z87442 Personal history of urinary calculi: Secondary | ICD-10-CM | POA: Diagnosis not present

## 2022-09-14 DIAGNOSIS — I129 Hypertensive chronic kidney disease with stage 1 through stage 4 chronic kidney disease, or unspecified chronic kidney disease: Secondary | ICD-10-CM | POA: Diagnosis not present

## 2022-09-14 DIAGNOSIS — H25812 Combined forms of age-related cataract, left eye: Secondary | ICD-10-CM | POA: Diagnosis not present

## 2022-09-14 DIAGNOSIS — M0800 Unspecified juvenile rheumatoid arthritis of unspecified site: Secondary | ICD-10-CM | POA: Diagnosis not present

## 2022-09-14 DIAGNOSIS — D62 Acute posthemorrhagic anemia: Secondary | ICD-10-CM | POA: Diagnosis not present

## 2022-09-17 DIAGNOSIS — Z7982 Long term (current) use of aspirin: Secondary | ICD-10-CM | POA: Diagnosis not present

## 2022-09-17 DIAGNOSIS — T84038A Mechanical loosening of other internal prosthetic joint, initial encounter: Secondary | ICD-10-CM | POA: Diagnosis not present

## 2022-09-17 DIAGNOSIS — Z87891 Personal history of nicotine dependence: Secondary | ICD-10-CM | POA: Diagnosis not present

## 2022-09-17 DIAGNOSIS — Z96651 Presence of right artificial knee joint: Secondary | ICD-10-CM | POA: Diagnosis not present

## 2022-09-17 DIAGNOSIS — H25812 Combined forms of age-related cataract, left eye: Secondary | ICD-10-CM | POA: Diagnosis not present

## 2022-09-17 DIAGNOSIS — Z96649 Presence of unspecified artificial hip joint: Secondary | ICD-10-CM | POA: Diagnosis not present

## 2022-09-17 DIAGNOSIS — Z452 Encounter for adjustment and management of vascular access device: Secondary | ICD-10-CM | POA: Diagnosis not present

## 2022-09-17 DIAGNOSIS — Z87442 Personal history of urinary calculi: Secondary | ICD-10-CM | POA: Diagnosis not present

## 2022-09-17 DIAGNOSIS — I129 Hypertensive chronic kidney disease with stage 1 through stage 4 chronic kidney disease, or unspecified chronic kidney disease: Secondary | ICD-10-CM | POA: Diagnosis not present

## 2022-09-17 DIAGNOSIS — T8451XD Infection and inflammatory reaction due to internal right hip prosthesis, subsequent encounter: Secondary | ICD-10-CM | POA: Diagnosis not present

## 2022-09-17 DIAGNOSIS — T84030D Mechanical loosening of internal right hip prosthetic joint, subsequent encounter: Secondary | ICD-10-CM | POA: Diagnosis not present

## 2022-09-17 DIAGNOSIS — Z792 Long term (current) use of antibiotics: Secondary | ICD-10-CM | POA: Diagnosis not present

## 2022-09-17 DIAGNOSIS — D62 Acute posthemorrhagic anemia: Secondary | ICD-10-CM | POA: Diagnosis not present

## 2022-09-17 DIAGNOSIS — Z87440 Personal history of urinary (tract) infections: Secondary | ICD-10-CM | POA: Diagnosis not present

## 2022-09-17 DIAGNOSIS — Z96641 Presence of right artificial hip joint: Secondary | ICD-10-CM | POA: Diagnosis not present

## 2022-09-17 DIAGNOSIS — Z8541 Personal history of malignant neoplasm of cervix uteri: Secondary | ICD-10-CM | POA: Diagnosis not present

## 2022-09-17 DIAGNOSIS — N3946 Mixed incontinence: Secondary | ICD-10-CM | POA: Diagnosis not present

## 2022-09-17 DIAGNOSIS — Z96642 Presence of left artificial hip joint: Secondary | ICD-10-CM | POA: Diagnosis not present

## 2022-09-17 DIAGNOSIS — N184 Chronic kidney disease, stage 4 (severe): Secondary | ICD-10-CM | POA: Diagnosis not present

## 2022-09-17 DIAGNOSIS — Z9181 History of falling: Secondary | ICD-10-CM | POA: Diagnosis not present

## 2022-09-17 DIAGNOSIS — M0800 Unspecified juvenile rheumatoid arthritis of unspecified site: Secondary | ICD-10-CM | POA: Diagnosis not present

## 2022-09-21 DIAGNOSIS — Z96649 Presence of unspecified artificial hip joint: Secondary | ICD-10-CM | POA: Diagnosis not present

## 2022-09-25 DIAGNOSIS — Z87442 Personal history of urinary calculi: Secondary | ICD-10-CM | POA: Diagnosis not present

## 2022-09-25 DIAGNOSIS — T84030D Mechanical loosening of internal right hip prosthetic joint, subsequent encounter: Secondary | ICD-10-CM | POA: Diagnosis not present

## 2022-09-25 DIAGNOSIS — Z87440 Personal history of urinary (tract) infections: Secondary | ICD-10-CM | POA: Diagnosis not present

## 2022-09-25 DIAGNOSIS — Z96641 Presence of right artificial hip joint: Secondary | ICD-10-CM | POA: Diagnosis not present

## 2022-09-25 DIAGNOSIS — Z87891 Personal history of nicotine dependence: Secondary | ICD-10-CM | POA: Diagnosis not present

## 2022-09-25 DIAGNOSIS — Z452 Encounter for adjustment and management of vascular access device: Secondary | ICD-10-CM | POA: Diagnosis not present

## 2022-09-25 DIAGNOSIS — Z792 Long term (current) use of antibiotics: Secondary | ICD-10-CM | POA: Diagnosis not present

## 2022-09-25 DIAGNOSIS — Z96642 Presence of left artificial hip joint: Secondary | ICD-10-CM | POA: Diagnosis not present

## 2022-09-25 DIAGNOSIS — Z8541 Personal history of malignant neoplasm of cervix uteri: Secondary | ICD-10-CM | POA: Diagnosis not present

## 2022-09-25 DIAGNOSIS — N3946 Mixed incontinence: Secondary | ICD-10-CM | POA: Diagnosis not present

## 2022-09-25 DIAGNOSIS — Z9181 History of falling: Secondary | ICD-10-CM | POA: Diagnosis not present

## 2022-09-25 DIAGNOSIS — Z96651 Presence of right artificial knee joint: Secondary | ICD-10-CM | POA: Diagnosis not present

## 2022-09-25 DIAGNOSIS — H25812 Combined forms of age-related cataract, left eye: Secondary | ICD-10-CM | POA: Diagnosis not present

## 2022-09-25 DIAGNOSIS — N184 Chronic kidney disease, stage 4 (severe): Secondary | ICD-10-CM | POA: Diagnosis not present

## 2022-09-25 DIAGNOSIS — Z7982 Long term (current) use of aspirin: Secondary | ICD-10-CM | POA: Diagnosis not present

## 2022-09-25 DIAGNOSIS — T8451XD Infection and inflammatory reaction due to internal right hip prosthesis, subsequent encounter: Secondary | ICD-10-CM | POA: Diagnosis not present

## 2022-09-25 DIAGNOSIS — M0800 Unspecified juvenile rheumatoid arthritis of unspecified site: Secondary | ICD-10-CM | POA: Diagnosis not present

## 2022-09-25 DIAGNOSIS — D62 Acute posthemorrhagic anemia: Secondary | ICD-10-CM | POA: Diagnosis not present

## 2022-09-25 DIAGNOSIS — I129 Hypertensive chronic kidney disease with stage 1 through stage 4 chronic kidney disease, or unspecified chronic kidney disease: Secondary | ICD-10-CM | POA: Diagnosis not present

## 2022-09-28 DIAGNOSIS — Z8541 Personal history of malignant neoplasm of cervix uteri: Secondary | ICD-10-CM | POA: Diagnosis not present

## 2022-09-28 DIAGNOSIS — Z96642 Presence of left artificial hip joint: Secondary | ICD-10-CM | POA: Diagnosis not present

## 2022-09-28 DIAGNOSIS — N3946 Mixed incontinence: Secondary | ICD-10-CM | POA: Diagnosis not present

## 2022-09-28 DIAGNOSIS — Z7982 Long term (current) use of aspirin: Secondary | ICD-10-CM | POA: Diagnosis not present

## 2022-09-28 DIAGNOSIS — Z87442 Personal history of urinary calculi: Secondary | ICD-10-CM | POA: Diagnosis not present

## 2022-09-28 DIAGNOSIS — T84030D Mechanical loosening of internal right hip prosthetic joint, subsequent encounter: Secondary | ICD-10-CM | POA: Diagnosis not present

## 2022-09-28 DIAGNOSIS — N184 Chronic kidney disease, stage 4 (severe): Secondary | ICD-10-CM | POA: Diagnosis not present

## 2022-09-28 DIAGNOSIS — D62 Acute posthemorrhagic anemia: Secondary | ICD-10-CM | POA: Diagnosis not present

## 2022-09-28 DIAGNOSIS — H25812 Combined forms of age-related cataract, left eye: Secondary | ICD-10-CM | POA: Diagnosis not present

## 2022-09-28 DIAGNOSIS — Z792 Long term (current) use of antibiotics: Secondary | ICD-10-CM | POA: Diagnosis not present

## 2022-09-28 DIAGNOSIS — Z452 Encounter for adjustment and management of vascular access device: Secondary | ICD-10-CM | POA: Diagnosis not present

## 2022-09-28 DIAGNOSIS — I129 Hypertensive chronic kidney disease with stage 1 through stage 4 chronic kidney disease, or unspecified chronic kidney disease: Secondary | ICD-10-CM | POA: Diagnosis not present

## 2022-09-28 DIAGNOSIS — Z87440 Personal history of urinary (tract) infections: Secondary | ICD-10-CM | POA: Diagnosis not present

## 2022-09-28 DIAGNOSIS — Z96641 Presence of right artificial hip joint: Secondary | ICD-10-CM | POA: Diagnosis not present

## 2022-09-28 DIAGNOSIS — T8451XD Infection and inflammatory reaction due to internal right hip prosthesis, subsequent encounter: Secondary | ICD-10-CM | POA: Diagnosis not present

## 2022-09-28 DIAGNOSIS — Z96651 Presence of right artificial knee joint: Secondary | ICD-10-CM | POA: Diagnosis not present

## 2022-09-28 DIAGNOSIS — M0800 Unspecified juvenile rheumatoid arthritis of unspecified site: Secondary | ICD-10-CM | POA: Diagnosis not present

## 2022-09-28 DIAGNOSIS — Z87891 Personal history of nicotine dependence: Secondary | ICD-10-CM | POA: Diagnosis not present

## 2022-09-28 DIAGNOSIS — Z9181 History of falling: Secondary | ICD-10-CM | POA: Diagnosis not present

## 2022-10-01 DIAGNOSIS — Z87891 Personal history of nicotine dependence: Secondary | ICD-10-CM | POA: Diagnosis not present

## 2022-10-01 DIAGNOSIS — I129 Hypertensive chronic kidney disease with stage 1 through stage 4 chronic kidney disease, or unspecified chronic kidney disease: Secondary | ICD-10-CM | POA: Diagnosis not present

## 2022-10-01 DIAGNOSIS — Z792 Long term (current) use of antibiotics: Secondary | ICD-10-CM | POA: Diagnosis not present

## 2022-10-01 DIAGNOSIS — Z7982 Long term (current) use of aspirin: Secondary | ICD-10-CM | POA: Diagnosis not present

## 2022-10-01 DIAGNOSIS — Z9181 History of falling: Secondary | ICD-10-CM | POA: Diagnosis not present

## 2022-10-01 DIAGNOSIS — Z87440 Personal history of urinary (tract) infections: Secondary | ICD-10-CM | POA: Diagnosis not present

## 2022-10-01 DIAGNOSIS — Z452 Encounter for adjustment and management of vascular access device: Secondary | ICD-10-CM | POA: Diagnosis not present

## 2022-10-01 DIAGNOSIS — Z96651 Presence of right artificial knee joint: Secondary | ICD-10-CM | POA: Diagnosis not present

## 2022-10-01 DIAGNOSIS — T84030D Mechanical loosening of internal right hip prosthetic joint, subsequent encounter: Secondary | ICD-10-CM | POA: Diagnosis not present

## 2022-10-01 DIAGNOSIS — Z8541 Personal history of malignant neoplasm of cervix uteri: Secondary | ICD-10-CM | POA: Diagnosis not present

## 2022-10-01 DIAGNOSIS — Z87442 Personal history of urinary calculi: Secondary | ICD-10-CM | POA: Diagnosis not present

## 2022-10-01 DIAGNOSIS — M0800 Unspecified juvenile rheumatoid arthritis of unspecified site: Secondary | ICD-10-CM | POA: Diagnosis not present

## 2022-10-01 DIAGNOSIS — Z96642 Presence of left artificial hip joint: Secondary | ICD-10-CM | POA: Diagnosis not present

## 2022-10-01 DIAGNOSIS — H25812 Combined forms of age-related cataract, left eye: Secondary | ICD-10-CM | POA: Diagnosis not present

## 2022-10-01 DIAGNOSIS — N3946 Mixed incontinence: Secondary | ICD-10-CM | POA: Diagnosis not present

## 2022-10-01 DIAGNOSIS — T8451XD Infection and inflammatory reaction due to internal right hip prosthesis, subsequent encounter: Secondary | ICD-10-CM | POA: Diagnosis not present

## 2022-10-01 DIAGNOSIS — D62 Acute posthemorrhagic anemia: Secondary | ICD-10-CM | POA: Diagnosis not present

## 2022-10-01 DIAGNOSIS — N184 Chronic kidney disease, stage 4 (severe): Secondary | ICD-10-CM | POA: Diagnosis not present

## 2022-10-01 DIAGNOSIS — Z96641 Presence of right artificial hip joint: Secondary | ICD-10-CM | POA: Diagnosis not present

## 2022-10-03 DIAGNOSIS — T8451XD Infection and inflammatory reaction due to internal right hip prosthesis, subsequent encounter: Secondary | ICD-10-CM | POA: Diagnosis not present

## 2022-10-03 DIAGNOSIS — T84030D Mechanical loosening of internal right hip prosthetic joint, subsequent encounter: Secondary | ICD-10-CM | POA: Diagnosis not present

## 2022-10-03 DIAGNOSIS — N184 Chronic kidney disease, stage 4 (severe): Secondary | ICD-10-CM | POA: Diagnosis not present

## 2022-10-03 DIAGNOSIS — N3946 Mixed incontinence: Secondary | ICD-10-CM | POA: Diagnosis not present

## 2022-10-03 DIAGNOSIS — Z792 Long term (current) use of antibiotics: Secondary | ICD-10-CM | POA: Diagnosis not present

## 2022-10-03 DIAGNOSIS — Z7982 Long term (current) use of aspirin: Secondary | ICD-10-CM | POA: Diagnosis not present

## 2022-10-03 DIAGNOSIS — H25812 Combined forms of age-related cataract, left eye: Secondary | ICD-10-CM | POA: Diagnosis not present

## 2022-10-03 DIAGNOSIS — Z8541 Personal history of malignant neoplasm of cervix uteri: Secondary | ICD-10-CM | POA: Diagnosis not present

## 2022-10-03 DIAGNOSIS — Z96641 Presence of right artificial hip joint: Secondary | ICD-10-CM | POA: Diagnosis not present

## 2022-10-03 DIAGNOSIS — Z87440 Personal history of urinary (tract) infections: Secondary | ICD-10-CM | POA: Diagnosis not present

## 2022-10-03 DIAGNOSIS — Z87891 Personal history of nicotine dependence: Secondary | ICD-10-CM | POA: Diagnosis not present

## 2022-10-03 DIAGNOSIS — Z9181 History of falling: Secondary | ICD-10-CM | POA: Diagnosis not present

## 2022-10-03 DIAGNOSIS — Z87442 Personal history of urinary calculi: Secondary | ICD-10-CM | POA: Diagnosis not present

## 2022-10-03 DIAGNOSIS — D62 Acute posthemorrhagic anemia: Secondary | ICD-10-CM | POA: Diagnosis not present

## 2022-10-03 DIAGNOSIS — Z96651 Presence of right artificial knee joint: Secondary | ICD-10-CM | POA: Diagnosis not present

## 2022-10-03 DIAGNOSIS — M0800 Unspecified juvenile rheumatoid arthritis of unspecified site: Secondary | ICD-10-CM | POA: Diagnosis not present

## 2022-10-03 DIAGNOSIS — Z96642 Presence of left artificial hip joint: Secondary | ICD-10-CM | POA: Diagnosis not present

## 2022-10-03 DIAGNOSIS — Z452 Encounter for adjustment and management of vascular access device: Secondary | ICD-10-CM | POA: Diagnosis not present

## 2022-10-03 DIAGNOSIS — I129 Hypertensive chronic kidney disease with stage 1 through stage 4 chronic kidney disease, or unspecified chronic kidney disease: Secondary | ICD-10-CM | POA: Diagnosis not present

## 2022-10-08 DIAGNOSIS — T8451XD Infection and inflammatory reaction due to internal right hip prosthesis, subsequent encounter: Secondary | ICD-10-CM | POA: Diagnosis not present

## 2022-10-08 DIAGNOSIS — T84030D Mechanical loosening of internal right hip prosthetic joint, subsequent encounter: Secondary | ICD-10-CM | POA: Diagnosis not present

## 2022-10-08 DIAGNOSIS — M0800 Unspecified juvenile rheumatoid arthritis of unspecified site: Secondary | ICD-10-CM | POA: Diagnosis not present

## 2022-10-08 DIAGNOSIS — Z87440 Personal history of urinary (tract) infections: Secondary | ICD-10-CM | POA: Diagnosis not present

## 2022-10-08 DIAGNOSIS — Z96641 Presence of right artificial hip joint: Secondary | ICD-10-CM | POA: Diagnosis not present

## 2022-10-08 DIAGNOSIS — N3946 Mixed incontinence: Secondary | ICD-10-CM | POA: Diagnosis not present

## 2022-10-08 DIAGNOSIS — Z87891 Personal history of nicotine dependence: Secondary | ICD-10-CM | POA: Diagnosis not present

## 2022-10-08 DIAGNOSIS — I129 Hypertensive chronic kidney disease with stage 1 through stage 4 chronic kidney disease, or unspecified chronic kidney disease: Secondary | ICD-10-CM | POA: Diagnosis not present

## 2022-10-08 DIAGNOSIS — Z96651 Presence of right artificial knee joint: Secondary | ICD-10-CM | POA: Diagnosis not present

## 2022-10-08 DIAGNOSIS — Z452 Encounter for adjustment and management of vascular access device: Secondary | ICD-10-CM | POA: Diagnosis not present

## 2022-10-08 DIAGNOSIS — H25812 Combined forms of age-related cataract, left eye: Secondary | ICD-10-CM | POA: Diagnosis not present

## 2022-10-08 DIAGNOSIS — Z7982 Long term (current) use of aspirin: Secondary | ICD-10-CM | POA: Diagnosis not present

## 2022-10-08 DIAGNOSIS — Z9181 History of falling: Secondary | ICD-10-CM | POA: Diagnosis not present

## 2022-10-08 DIAGNOSIS — Z96642 Presence of left artificial hip joint: Secondary | ICD-10-CM | POA: Diagnosis not present

## 2022-10-08 DIAGNOSIS — Z87442 Personal history of urinary calculi: Secondary | ICD-10-CM | POA: Diagnosis not present

## 2022-10-08 DIAGNOSIS — Z792 Long term (current) use of antibiotics: Secondary | ICD-10-CM | POA: Diagnosis not present

## 2022-10-08 DIAGNOSIS — Z8541 Personal history of malignant neoplasm of cervix uteri: Secondary | ICD-10-CM | POA: Diagnosis not present

## 2022-10-08 DIAGNOSIS — D62 Acute posthemorrhagic anemia: Secondary | ICD-10-CM | POA: Diagnosis not present

## 2022-10-08 DIAGNOSIS — N184 Chronic kidney disease, stage 4 (severe): Secondary | ICD-10-CM | POA: Diagnosis not present

## 2022-10-10 DIAGNOSIS — R300 Dysuria: Secondary | ICD-10-CM | POA: Diagnosis not present

## 2022-10-15 DIAGNOSIS — Z9181 History of falling: Secondary | ICD-10-CM | POA: Diagnosis not present

## 2022-10-15 DIAGNOSIS — Z96641 Presence of right artificial hip joint: Secondary | ICD-10-CM | POA: Diagnosis not present

## 2022-10-15 DIAGNOSIS — T8451XD Infection and inflammatory reaction due to internal right hip prosthesis, subsequent encounter: Secondary | ICD-10-CM | POA: Diagnosis not present

## 2022-10-15 DIAGNOSIS — Z96651 Presence of right artificial knee joint: Secondary | ICD-10-CM | POA: Diagnosis not present

## 2022-10-15 DIAGNOSIS — Z87891 Personal history of nicotine dependence: Secondary | ICD-10-CM | POA: Diagnosis not present

## 2022-10-15 DIAGNOSIS — Z7982 Long term (current) use of aspirin: Secondary | ICD-10-CM | POA: Diagnosis not present

## 2022-10-15 DIAGNOSIS — Z87440 Personal history of urinary (tract) infections: Secondary | ICD-10-CM | POA: Diagnosis not present

## 2022-10-15 DIAGNOSIS — M0800 Unspecified juvenile rheumatoid arthritis of unspecified site: Secondary | ICD-10-CM | POA: Diagnosis not present

## 2022-10-15 DIAGNOSIS — Z8541 Personal history of malignant neoplasm of cervix uteri: Secondary | ICD-10-CM | POA: Diagnosis not present

## 2022-10-15 DIAGNOSIS — Z452 Encounter for adjustment and management of vascular access device: Secondary | ICD-10-CM | POA: Diagnosis not present

## 2022-10-15 DIAGNOSIS — H25812 Combined forms of age-related cataract, left eye: Secondary | ICD-10-CM | POA: Diagnosis not present

## 2022-10-15 DIAGNOSIS — Z96642 Presence of left artificial hip joint: Secondary | ICD-10-CM | POA: Diagnosis not present

## 2022-10-15 DIAGNOSIS — N3946 Mixed incontinence: Secondary | ICD-10-CM | POA: Diagnosis not present

## 2022-10-15 DIAGNOSIS — Z792 Long term (current) use of antibiotics: Secondary | ICD-10-CM | POA: Diagnosis not present

## 2022-10-15 DIAGNOSIS — N184 Chronic kidney disease, stage 4 (severe): Secondary | ICD-10-CM | POA: Diagnosis not present

## 2022-10-15 DIAGNOSIS — I129 Hypertensive chronic kidney disease with stage 1 through stage 4 chronic kidney disease, or unspecified chronic kidney disease: Secondary | ICD-10-CM | POA: Diagnosis not present

## 2022-10-15 DIAGNOSIS — Z87442 Personal history of urinary calculi: Secondary | ICD-10-CM | POA: Diagnosis not present

## 2022-10-15 DIAGNOSIS — D62 Acute posthemorrhagic anemia: Secondary | ICD-10-CM | POA: Diagnosis not present

## 2022-10-15 DIAGNOSIS — T84030D Mechanical loosening of internal right hip prosthetic joint, subsequent encounter: Secondary | ICD-10-CM | POA: Diagnosis not present

## 2022-10-18 DIAGNOSIS — Z87440 Personal history of urinary (tract) infections: Secondary | ICD-10-CM | POA: Diagnosis not present

## 2022-10-18 DIAGNOSIS — Z8541 Personal history of malignant neoplasm of cervix uteri: Secondary | ICD-10-CM | POA: Diagnosis not present

## 2022-10-18 DIAGNOSIS — T84038A Mechanical loosening of other internal prosthetic joint, initial encounter: Secondary | ICD-10-CM | POA: Diagnosis not present

## 2022-10-18 DIAGNOSIS — Z9181 History of falling: Secondary | ICD-10-CM | POA: Diagnosis not present

## 2022-10-18 DIAGNOSIS — T8451XD Infection and inflammatory reaction due to internal right hip prosthesis, subsequent encounter: Secondary | ICD-10-CM | POA: Diagnosis not present

## 2022-10-18 DIAGNOSIS — D62 Acute posthemorrhagic anemia: Secondary | ICD-10-CM | POA: Diagnosis not present

## 2022-10-18 DIAGNOSIS — N184 Chronic kidney disease, stage 4 (severe): Secondary | ICD-10-CM | POA: Diagnosis not present

## 2022-10-18 DIAGNOSIS — Z96649 Presence of unspecified artificial hip joint: Secondary | ICD-10-CM | POA: Diagnosis not present

## 2022-10-18 DIAGNOSIS — I129 Hypertensive chronic kidney disease with stage 1 through stage 4 chronic kidney disease, or unspecified chronic kidney disease: Secondary | ICD-10-CM | POA: Diagnosis not present

## 2022-10-18 DIAGNOSIS — Z792 Long term (current) use of antibiotics: Secondary | ICD-10-CM | POA: Diagnosis not present

## 2022-10-18 DIAGNOSIS — Z87442 Personal history of urinary calculi: Secondary | ICD-10-CM | POA: Diagnosis not present

## 2022-10-18 DIAGNOSIS — Z96651 Presence of right artificial knee joint: Secondary | ICD-10-CM | POA: Diagnosis not present

## 2022-10-18 DIAGNOSIS — M0800 Unspecified juvenile rheumatoid arthritis of unspecified site: Secondary | ICD-10-CM | POA: Diagnosis not present

## 2022-10-18 DIAGNOSIS — H25812 Combined forms of age-related cataract, left eye: Secondary | ICD-10-CM | POA: Diagnosis not present

## 2022-10-18 DIAGNOSIS — Z452 Encounter for adjustment and management of vascular access device: Secondary | ICD-10-CM | POA: Diagnosis not present

## 2022-10-18 DIAGNOSIS — Z87891 Personal history of nicotine dependence: Secondary | ICD-10-CM | POA: Diagnosis not present

## 2022-10-18 DIAGNOSIS — N3946 Mixed incontinence: Secondary | ICD-10-CM | POA: Diagnosis not present

## 2022-10-18 DIAGNOSIS — Z7982 Long term (current) use of aspirin: Secondary | ICD-10-CM | POA: Diagnosis not present

## 2022-10-18 DIAGNOSIS — T84030D Mechanical loosening of internal right hip prosthetic joint, subsequent encounter: Secondary | ICD-10-CM | POA: Diagnosis not present

## 2022-10-18 DIAGNOSIS — Z96642 Presence of left artificial hip joint: Secondary | ICD-10-CM | POA: Diagnosis not present

## 2022-10-18 DIAGNOSIS — Z96641 Presence of right artificial hip joint: Secondary | ICD-10-CM | POA: Diagnosis not present

## 2022-10-22 DIAGNOSIS — Z96649 Presence of unspecified artificial hip joint: Secondary | ICD-10-CM | POA: Diagnosis not present

## 2022-10-24 DIAGNOSIS — T84030D Mechanical loosening of internal right hip prosthetic joint, subsequent encounter: Secondary | ICD-10-CM | POA: Diagnosis not present

## 2022-10-24 DIAGNOSIS — M25571 Pain in right ankle and joints of right foot: Secondary | ICD-10-CM | POA: Diagnosis not present

## 2022-10-24 DIAGNOSIS — Z87891 Personal history of nicotine dependence: Secondary | ICD-10-CM | POA: Diagnosis not present

## 2022-10-24 DIAGNOSIS — N184 Chronic kidney disease, stage 4 (severe): Secondary | ICD-10-CM | POA: Diagnosis not present

## 2022-10-24 DIAGNOSIS — Z87442 Personal history of urinary calculi: Secondary | ICD-10-CM | POA: Diagnosis not present

## 2022-10-24 DIAGNOSIS — I129 Hypertensive chronic kidney disease with stage 1 through stage 4 chronic kidney disease, or unspecified chronic kidney disease: Secondary | ICD-10-CM | POA: Diagnosis not present

## 2022-10-24 DIAGNOSIS — D62 Acute posthemorrhagic anemia: Secondary | ICD-10-CM | POA: Diagnosis not present

## 2022-10-24 DIAGNOSIS — M25562 Pain in left knee: Secondary | ICD-10-CM | POA: Diagnosis not present

## 2022-10-24 DIAGNOSIS — M0800 Unspecified juvenile rheumatoid arthritis of unspecified site: Secondary | ICD-10-CM | POA: Diagnosis not present

## 2022-10-24 DIAGNOSIS — Z87440 Personal history of urinary (tract) infections: Secondary | ICD-10-CM | POA: Diagnosis not present

## 2022-10-24 DIAGNOSIS — Z792 Long term (current) use of antibiotics: Secondary | ICD-10-CM | POA: Diagnosis not present

## 2022-10-24 DIAGNOSIS — N3946 Mixed incontinence: Secondary | ICD-10-CM | POA: Diagnosis not present

## 2022-10-24 DIAGNOSIS — M25572 Pain in left ankle and joints of left foot: Secondary | ICD-10-CM | POA: Diagnosis not present

## 2022-10-24 DIAGNOSIS — Z7982 Long term (current) use of aspirin: Secondary | ICD-10-CM | POA: Diagnosis not present

## 2022-10-24 DIAGNOSIS — H25812 Combined forms of age-related cataract, left eye: Secondary | ICD-10-CM | POA: Diagnosis not present

## 2022-10-24 DIAGNOSIS — M545 Low back pain, unspecified: Secondary | ICD-10-CM | POA: Diagnosis not present

## 2022-10-24 DIAGNOSIS — Z96642 Presence of left artificial hip joint: Secondary | ICD-10-CM | POA: Diagnosis not present

## 2022-10-24 DIAGNOSIS — Z8541 Personal history of malignant neoplasm of cervix uteri: Secondary | ICD-10-CM | POA: Diagnosis not present

## 2022-10-24 DIAGNOSIS — Z9181 History of falling: Secondary | ICD-10-CM | POA: Diagnosis not present

## 2022-10-24 DIAGNOSIS — Z96651 Presence of right artificial knee joint: Secondary | ICD-10-CM | POA: Diagnosis not present

## 2022-10-24 DIAGNOSIS — T8451XA Infection and inflammatory reaction due to internal right hip prosthesis, initial encounter: Secondary | ICD-10-CM | POA: Diagnosis not present

## 2022-10-26 DIAGNOSIS — Z8541 Personal history of malignant neoplasm of cervix uteri: Secondary | ICD-10-CM | POA: Diagnosis not present

## 2022-10-26 DIAGNOSIS — I129 Hypertensive chronic kidney disease with stage 1 through stage 4 chronic kidney disease, or unspecified chronic kidney disease: Secondary | ICD-10-CM | POA: Diagnosis not present

## 2022-10-26 DIAGNOSIS — T84030D Mechanical loosening of internal right hip prosthetic joint, subsequent encounter: Secondary | ICD-10-CM | POA: Diagnosis not present

## 2022-10-26 DIAGNOSIS — T8451XA Infection and inflammatory reaction due to internal right hip prosthesis, initial encounter: Secondary | ICD-10-CM | POA: Diagnosis not present

## 2022-10-26 DIAGNOSIS — Z792 Long term (current) use of antibiotics: Secondary | ICD-10-CM | POA: Diagnosis not present

## 2022-10-26 DIAGNOSIS — N3946 Mixed incontinence: Secondary | ICD-10-CM | POA: Diagnosis not present

## 2022-10-26 DIAGNOSIS — M0800 Unspecified juvenile rheumatoid arthritis of unspecified site: Secondary | ICD-10-CM | POA: Diagnosis not present

## 2022-10-26 DIAGNOSIS — N184 Chronic kidney disease, stage 4 (severe): Secondary | ICD-10-CM | POA: Diagnosis not present

## 2022-10-26 DIAGNOSIS — Z87442 Personal history of urinary calculi: Secondary | ICD-10-CM | POA: Diagnosis not present

## 2022-10-26 DIAGNOSIS — D62 Acute posthemorrhagic anemia: Secondary | ICD-10-CM | POA: Diagnosis not present

## 2022-10-26 DIAGNOSIS — H25812 Combined forms of age-related cataract, left eye: Secondary | ICD-10-CM | POA: Diagnosis not present

## 2022-10-26 DIAGNOSIS — Z87440 Personal history of urinary (tract) infections: Secondary | ICD-10-CM | POA: Diagnosis not present

## 2022-10-26 DIAGNOSIS — Z96642 Presence of left artificial hip joint: Secondary | ICD-10-CM | POA: Diagnosis not present

## 2022-10-26 DIAGNOSIS — M25572 Pain in left ankle and joints of left foot: Secondary | ICD-10-CM | POA: Diagnosis not present

## 2022-10-26 DIAGNOSIS — Z9181 History of falling: Secondary | ICD-10-CM | POA: Diagnosis not present

## 2022-10-26 DIAGNOSIS — Z96651 Presence of right artificial knee joint: Secondary | ICD-10-CM | POA: Diagnosis not present

## 2022-10-26 DIAGNOSIS — M25562 Pain in left knee: Secondary | ICD-10-CM | POA: Diagnosis not present

## 2022-10-26 DIAGNOSIS — Z87891 Personal history of nicotine dependence: Secondary | ICD-10-CM | POA: Diagnosis not present

## 2022-10-26 DIAGNOSIS — Z7982 Long term (current) use of aspirin: Secondary | ICD-10-CM | POA: Diagnosis not present

## 2022-10-26 DIAGNOSIS — M25571 Pain in right ankle and joints of right foot: Secondary | ICD-10-CM | POA: Diagnosis not present

## 2022-10-26 DIAGNOSIS — M545 Low back pain, unspecified: Secondary | ICD-10-CM | POA: Diagnosis not present

## 2022-11-06 DIAGNOSIS — M25571 Pain in right ankle and joints of right foot: Secondary | ICD-10-CM | POA: Diagnosis not present

## 2022-11-06 DIAGNOSIS — T8451XA Infection and inflammatory reaction due to internal right hip prosthesis, initial encounter: Secondary | ICD-10-CM | POA: Diagnosis not present

## 2022-11-06 DIAGNOSIS — Z96651 Presence of right artificial knee joint: Secondary | ICD-10-CM | POA: Diagnosis not present

## 2022-11-06 DIAGNOSIS — Z87440 Personal history of urinary (tract) infections: Secondary | ICD-10-CM | POA: Diagnosis not present

## 2022-11-06 DIAGNOSIS — Z87891 Personal history of nicotine dependence: Secondary | ICD-10-CM | POA: Diagnosis not present

## 2022-11-06 DIAGNOSIS — H25812 Combined forms of age-related cataract, left eye: Secondary | ICD-10-CM | POA: Diagnosis not present

## 2022-11-06 DIAGNOSIS — M0800 Unspecified juvenile rheumatoid arthritis of unspecified site: Secondary | ICD-10-CM | POA: Diagnosis not present

## 2022-11-06 DIAGNOSIS — Z87442 Personal history of urinary calculi: Secondary | ICD-10-CM | POA: Diagnosis not present

## 2022-11-06 DIAGNOSIS — D62 Acute posthemorrhagic anemia: Secondary | ICD-10-CM | POA: Diagnosis not present

## 2022-11-06 DIAGNOSIS — Z9181 History of falling: Secondary | ICD-10-CM | POA: Diagnosis not present

## 2022-11-06 DIAGNOSIS — M25572 Pain in left ankle and joints of left foot: Secondary | ICD-10-CM | POA: Diagnosis not present

## 2022-11-06 DIAGNOSIS — Z8541 Personal history of malignant neoplasm of cervix uteri: Secondary | ICD-10-CM | POA: Diagnosis not present

## 2022-11-06 DIAGNOSIS — N3946 Mixed incontinence: Secondary | ICD-10-CM | POA: Diagnosis not present

## 2022-11-06 DIAGNOSIS — M545 Low back pain, unspecified: Secondary | ICD-10-CM | POA: Diagnosis not present

## 2022-11-06 DIAGNOSIS — M25562 Pain in left knee: Secondary | ICD-10-CM | POA: Diagnosis not present

## 2022-11-06 DIAGNOSIS — Z7982 Long term (current) use of aspirin: Secondary | ICD-10-CM | POA: Diagnosis not present

## 2022-11-06 DIAGNOSIS — Z96642 Presence of left artificial hip joint: Secondary | ICD-10-CM | POA: Diagnosis not present

## 2022-11-06 DIAGNOSIS — N184 Chronic kidney disease, stage 4 (severe): Secondary | ICD-10-CM | POA: Diagnosis not present

## 2022-11-06 DIAGNOSIS — T84030D Mechanical loosening of internal right hip prosthetic joint, subsequent encounter: Secondary | ICD-10-CM | POA: Diagnosis not present

## 2022-11-06 DIAGNOSIS — I129 Hypertensive chronic kidney disease with stage 1 through stage 4 chronic kidney disease, or unspecified chronic kidney disease: Secondary | ICD-10-CM | POA: Diagnosis not present

## 2022-11-06 DIAGNOSIS — Z792 Long term (current) use of antibiotics: Secondary | ICD-10-CM | POA: Diagnosis not present

## 2022-11-09 ENCOUNTER — Inpatient Hospital Stay
Admission: EM | Admit: 2022-11-09 | Discharge: 2022-11-12 | DRG: 871 | Disposition: A | Discharge status: Home Health | Payer: Medicare HMO | Attending: Hospitalist | Admitting: Hospitalist

## 2022-11-09 DIAGNOSIS — A419 Sepsis, unspecified organism: Principal | ICD-10-CM | POA: Diagnosis present

## 2022-11-09 DIAGNOSIS — Z96643 Presence of artificial hip joint, bilateral: Secondary | ICD-10-CM | POA: Diagnosis present

## 2022-11-09 DIAGNOSIS — A0472 Enterocolitis due to Clostridium difficile, not specified as recurrent: Secondary | ICD-10-CM | POA: Diagnosis not present

## 2022-11-09 DIAGNOSIS — Z79891 Long term (current) use of opiate analgesic: Secondary | ICD-10-CM

## 2022-11-09 DIAGNOSIS — Z743 Need for continuous supervision: Secondary | ICD-10-CM | POA: Diagnosis not present

## 2022-11-09 DIAGNOSIS — A09 Infectious gastroenteritis and colitis, unspecified: Secondary | ICD-10-CM

## 2022-11-09 DIAGNOSIS — N390 Urinary tract infection, site not specified: Secondary | ICD-10-CM | POA: Diagnosis present

## 2022-11-09 DIAGNOSIS — D631 Anemia in chronic kidney disease: Secondary | ICD-10-CM | POA: Diagnosis present

## 2022-11-09 DIAGNOSIS — N189 Chronic kidney disease, unspecified: Secondary | ICD-10-CM | POA: Diagnosis present

## 2022-11-09 DIAGNOSIS — M009 Pyogenic arthritis, unspecified: Secondary | ICD-10-CM | POA: Diagnosis present

## 2022-11-09 DIAGNOSIS — Z880 Allergy status to penicillin: Secondary | ICD-10-CM

## 2022-11-09 DIAGNOSIS — U071 COVID-19: Secondary | ICD-10-CM | POA: Diagnosis present

## 2022-11-09 DIAGNOSIS — Z87442 Personal history of urinary calculi: Secondary | ICD-10-CM

## 2022-11-09 DIAGNOSIS — E860 Dehydration: Secondary | ICD-10-CM | POA: Diagnosis not present

## 2022-11-09 DIAGNOSIS — R102 Pelvic and perineal pain: Secondary | ICD-10-CM

## 2022-11-09 DIAGNOSIS — Z96651 Presence of right artificial knee joint: Secondary | ICD-10-CM | POA: Diagnosis present

## 2022-11-09 DIAGNOSIS — R112 Nausea with vomiting, unspecified: Secondary | ICD-10-CM | POA: Diagnosis present

## 2022-11-09 DIAGNOSIS — M069 Rheumatoid arthritis, unspecified: Secondary | ICD-10-CM | POA: Diagnosis present

## 2022-11-09 DIAGNOSIS — D649 Anemia, unspecified: Secondary | ICD-10-CM | POA: Diagnosis present

## 2022-11-09 DIAGNOSIS — Z792 Long term (current) use of antibiotics: Secondary | ICD-10-CM

## 2022-11-09 DIAGNOSIS — Z8541 Personal history of malignant neoplasm of cervix uteri: Secondary | ICD-10-CM

## 2022-11-09 DIAGNOSIS — N179 Acute kidney failure, unspecified: Secondary | ICD-10-CM | POA: Diagnosis present

## 2022-11-09 DIAGNOSIS — G8929 Other chronic pain: Secondary | ICD-10-CM | POA: Diagnosis present

## 2022-11-09 DIAGNOSIS — R6520 Severe sepsis without septic shock: Secondary | ICD-10-CM | POA: Diagnosis present

## 2022-11-09 DIAGNOSIS — Z90710 Acquired absence of both cervix and uterus: Secondary | ICD-10-CM

## 2022-11-09 DIAGNOSIS — I129 Hypertensive chronic kidney disease with stage 1 through stage 4 chronic kidney disease, or unspecified chronic kidney disease: Secondary | ICD-10-CM | POA: Diagnosis present

## 2022-11-09 DIAGNOSIS — R197 Diarrhea, unspecified: Secondary | ICD-10-CM

## 2022-11-09 DIAGNOSIS — Z79899 Other long term (current) drug therapy: Secondary | ICD-10-CM

## 2022-11-09 DIAGNOSIS — Z87440 Personal history of urinary (tract) infections: Secondary | ICD-10-CM

## 2022-11-09 DIAGNOSIS — I10 Essential (primary) hypertension: Secondary | ICD-10-CM

## 2022-11-09 DIAGNOSIS — Z885 Allergy status to narcotic agent status: Secondary | ICD-10-CM

## 2022-11-09 DIAGNOSIS — Z923 Personal history of irradiation: Secondary | ICD-10-CM

## 2022-11-09 LAB — GASTROINTESTINAL PANEL BY PCR, STOOL (REPLACES STOOL CULTURE)

## 2022-11-09 LAB — COMPREHENSIVE METABOLIC PANEL
ALT: 19 U/L (ref 0–44)
AST: 26 U/L (ref 15–41)
Albumin: 2.7 g/dL — ABNORMAL LOW (ref 3.5–5.0)
Alkaline Phosphatase: 140 U/L — ABNORMAL HIGH (ref 38–126)
Anion gap: 10 (ref 5–15)
BUN: 40 mg/dL — ABNORMAL HIGH (ref 8–23)
CO2: 18 mmol/L — ABNORMAL LOW (ref 22–32)
Calcium: 8.5 mg/dL — ABNORMAL LOW (ref 8.9–10.3)
Chloride: 111 mmol/L (ref 98–111)
Creatinine, Ser: 1.9 mg/dL — ABNORMAL HIGH (ref 0.44–1.00)
GFR, Estimated: 28 mL/min — ABNORMAL LOW (ref >60)
Glucose, Bld: 126 mg/dL — ABNORMAL HIGH (ref 70–99)
Potassium: 5 mmol/L (ref 3.5–5.1)
Sodium: 139 mmol/L (ref 135–145)
Total Bilirubin: 0.6 mg/dL (ref 0.3–1.2)
Total Protein: 6.7 g/dL (ref 6.5–8.1)

## 2022-11-09 LAB — CBC WITH DIFFERENTIAL/PLATELET
Abs Immature Granulocytes: 0.05 10*3/uL (ref 0.00–0.07)
Basophils Absolute: 0 10*3/uL (ref 0.0–0.1)
Basophils Relative: 0 %
Eosinophils Absolute: 0.3 10*3/uL (ref 0.0–0.5)
Eosinophils Relative: 2 %
HCT: 37.8 % (ref 36.0–46.0)
Hemoglobin: 11.7 g/dL — ABNORMAL LOW (ref 12.0–15.0)
Immature Granulocytes: 0 %
Lymphocytes Relative: 7 %
Lymphs Abs: 0.9 10*3/uL (ref 0.7–4.0)
MCH: 28.4 pg (ref 26.0–34.0)
MCHC: 31 g/dL (ref 30.0–36.0)
MCV: 91.7 fL (ref 80.0–100.0)
Monocytes Absolute: 0.4 10*3/uL (ref 0.1–1.0)
Monocytes Relative: 3 %
Neutro Abs: 10.2 10*3/uL — ABNORMAL HIGH (ref 1.7–7.7)
Neutrophils Relative %: 88 %
Platelets: 274 10*3/uL (ref 150–400)
RBC: 4.12 MIL/uL (ref 3.87–5.11)
RDW: 15.1 % (ref 11.5–15.5)
WBC: 11.7 10*3/uL — ABNORMAL HIGH (ref 4.0–10.5)
nRBC: 0 % (ref 0.0–0.2)

## 2022-11-09 LAB — URINALYSIS, ROUTINE W REFLEX MICROSCOPIC
Bilirubin Urine: NEGATIVE
Glucose, UA: NEGATIVE mg/dL
Ketones, ur: NEGATIVE mg/dL
Nitrite: NEGATIVE
Protein, ur: NEGATIVE mg/dL
Specific Gravity, Urine: 1.015 (ref 1.005–1.030)
Squamous Epithelial / HPF: NONE SEEN /HPF (ref 0–5)
WBC, UA: >50 WBC/hpf (ref 0–5)
pH: 5 (ref 5.0–8.0)

## 2022-11-09 LAB — RESP PANEL BY RT-PCR (RSV, FLU A&B, COVID)  RVPGX2
Influenza A by PCR: NEGATIVE
Influenza B by PCR: NEGATIVE
Resp Syncytial Virus by PCR: NEGATIVE
SARS Coronavirus 2 by RT PCR: POSITIVE — AB

## 2022-11-09 LAB — LIPASE, BLOOD: Lipase: 32 U/L (ref 11–51)

## 2022-11-09 LAB — C DIFFICILE QUICK SCREEN W PCR REFLEX
C Diff antigen: POSITIVE — AB
C Diff interpretation: DETECTED
C Diff toxin: POSITIVE — AB

## 2022-11-09 MED ORDER — ONDANSETRON HCL 4 MG/2ML IJ SOLN
4.0000 mg | Freq: Once | INTRAMUSCULAR | Status: AC
  Administered 2022-11-09: 4 mg via INTRAVENOUS
  Filled 2022-11-09: qty 2

## 2022-11-09 MED ORDER — SODIUM CHLORIDE 0.9 % IV BOLUS
1000.0000 mL | Freq: Once | INTRAVENOUS | Status: AC
  Administered 2022-11-09: 1000 mL via INTRAVENOUS

## 2022-11-09 MED ORDER — DIPHENOXYLATE-ATROPINE 2.5-0.025 MG PO TABS
1.0000 | ORAL_TABLET | Freq: Once | ORAL | Status: AC
  Administered 2022-11-09: 1 via ORAL
  Filled 2022-11-09: qty 1

## 2022-11-09 MED ORDER — VANCOMYCIN HCL 125 MG PO CAPS
125.0000 mg | ORAL_CAPSULE | Freq: Four times a day (QID) | ORAL | Status: DC
  Administered 2022-11-09 – 2022-11-12 (×10): 125 mg via ORAL
  Filled 2022-11-09 (×11): qty 1

## 2022-11-09 NOTE — Assessment & Plan Note (Signed)
We will treat once cultures resulted. Pt is asymptomatic. Urinalysis    Component Value Date/Time   COLORURINE YELLOW (A) 11/09/2022 2002   APPEARANCEUR CLOUDY (A) 11/09/2022 2002   APPEARANCEUR Cloudy (A) 10/21/2018 0841   LABSPEC 1.015 11/09/2022 2002   PHURINE 5.0 11/09/2022 2002   GLUCOSEU NEGATIVE 11/09/2022 2002   HGBUR SMALL (A) 11/09/2022 2002   BILIRUBINUR NEGATIVE 11/09/2022 2002   BILIRUBINUR negative 07/15/2020 0827   BILIRUBINUR Negative 10/21/2018 [place] 11/09/2022 2002   PROTEINUR NEGATIVE 11/09/2022 2002   UROBILINOGEN 0.2 07/15/2020 0827   NITRITE NEGATIVE 11/09/2022 2002   LEUKOCYTESUR LARGE (A) 11/09/2022 2002

## 2022-11-09 NOTE — H&P (Signed)
History and Physical    Chief Complaint: N/V/D/X 2 DAYS.     HISTORY OF PRESENT ILLNESS: Jeanne [name] is an 72 y.o. female  seen in ED for N/V/D since past few days.  Diarrhea is watery and large volume. No abdominal pain.  Pt had a similar episode on 08/28/2022 when pt came to ed was hydrated and d/w with otc meds.  Pt is on long term antibiotics - Trim sulfa - for rec uti history.  Pt today has c.diff/ uti/ covid.   Pt has  Past Medical History:  Diagnosis Date   Arthritis    RA   Cervical cancer ([place])    Complication of anesthesia    History of kidney stones    Hypertension    PONV (postoperative nausea and vomiting)    Review of Systems  Constitutional:  Positive for fatigue.  Gastrointestinal:  Positive for diarrhea, nausea and vomiting.  Musculoskeletal:  Positive for back pain.  All other systems reviewed and are negative.  Allergies  Allergen Reactions   Codeine Nausea Only and Nausea And Vomiting   Augmentin [Amoxicillin-Pot Clavulanate] Nausea Only    Very nauseated/ feels sore on body    Past Surgical History:  Procedure Laterality Date   ABDOMINAL HYSTERECTOMY  09/10/2003   CYSTOGRAM N/A 08/04/2018   Procedure: CYSTOGRAM WITH URETHAL DILATION;  Surgeon: Hollice Espy, MD;  Location: [facility];  Service: Urology;  Laterality: N/A;   CYSTOSCOPY W/ RETROGRADES Bilateral 08/04/2018   Procedure: CYSTOSCOPY WITH RETROGRADE PYELOGRAM;  Surgeon: Hollice Espy, MD;  Location: [facility];  Service: Urology;  Laterality: Bilateral;   CYSTOSCOPY/URETEROSCOPY/HOLMIUM LASER/STENT PLACEMENT Left 08/04/2018   Procedure: CYSTOSCOPY/URETEROSCOPY/HOLMIUM LASER/STENT PLACEMENT;  Surgeon: Hollice Espy, MD;  Location: [facility];  Service: Urology;  Laterality: Left;   JOINT REPLACEMENT     TOTAL HIP ARTHROPLASTY Right 1972   TOTAL HIP ARTHROPLASTY Left 1981   TOTAL KNEE ARTHROPLASTY Right 1990       MEDICATIONS: Current Outpatient Medications  Medication  Instructions   diclofenac (VOLTAREN) 75 mg, Oral, 2 times daily   diphenoxylate-atropine (LOMOTIL) 2.5-0.025 MG tablet Oral, 4 times daily PRN   doxycycline (MONODOX) 100 mg, Oral, 2 times daily   gabapentin (NEURONTIN) 100 mg, Oral, Daily at bedtime   HYDROcodone-acetaminophen (NORCO) 7.5-325 MG tablet 1 tablet, Oral, Every 6 hours PRN, Reviewed controlled substance record in PMP Aware.   lactobacillus acidophilus (BACID) TABS tablet 1 tablet, Oral, Daily   lidocaine-prilocaine (EMLA) cream APPLY SPARINGLY 2-3 TIMES A DAY TO AREA OF RADIATION THERAPY IRRITATION.   metoprolol tartrate (LOPRESSOR) 25 mg, Oral, 2 times daily, Please schedule an office visit before anymore refills.   ondansetron (ZOFRAN-ODT) 8 mg, Oral, Every 8 hours PRN   oxyCODONE (OXY IR/ROXICODONE) 5-10 mg, Oral, Every 4 hours PRN   Probiotic Product (PROBIOTIC PO) Daily   promethazine (PHENERGAN) 12.5 mg, Oral, Every 6 hours PRN   sulfamethoxazole-trimethoprim (BACTRIM DS) 800-160 MG tablet 1 tablet, Oral, 2 times daily   tretinoin (RETIN-A) 0.025 % cream Topical, Daily at bedtime   trimethoprim (TRIMPEX) 100 mg, Oral, Every evening     heparin  5,000 Units Subcutaneous Q8H   vancomycin  125 mg Oral QID    ED Course: Pt in Ed pt is il appearing.  Vitals:   11/09/22 1952 11/09/22 1957 11/09/22 1958 11/09/22 2332  BP:  110/72  122/72  Pulse:  (!) 107  (!) 102  Resp:  20  15  Temp:  98.5 F (36.9 C)  98  F (36.7 C)  TempSrc:  Oral  Oral  SpO2: 97% 98%  99%  Weight:   69.4 kg   Height:   '5\' 4"'$  (1.626 m)    No intake/output data recorded. SpO2: 99 % Blood work in ed shows: Abnormal U/A. Low bicarb and AKI on CKD. Mild anemia with hb of 11.7. C.Diff positive.  Results for orders placed or performed during the hospital encounter of 11/09/22 (from the past 24 hour(s))  Urinalysis, Routine w reflex microscopic -Urine, Clean Catch     Status: Abnormal   Collection Time: 11/09/22  8:02 PM  Result Value Ref Range    Color, Urine YELLOW (A) YELLOW   APPearance CLOUDY (A) CLEAR   Specific Gravity, Urine 1.015 1.005 - 1.030   pH 5.0 5.0 - 8.0   Glucose, UA NEGATIVE NEGATIVE mg/dL   Hgb urine dipstick SMALL (A) NEGATIVE   Bilirubin Urine NEGATIVE NEGATIVE   Ketones, ur NEGATIVE NEGATIVE mg/dL   Protein, ur NEGATIVE NEGATIVE mg/dL   Nitrite NEGATIVE NEGATIVE   Leukocytes,Ua LARGE (A) NEGATIVE   RBC / HPF 21-50 0 - 5 RBC/hpf   WBC, UA >50 0 - 5 WBC/hpf   Bacteria, UA RARE (A) NONE SEEN   Squamous Epithelial / HPF NONE SEEN 0 - 5 /HPF   Budding Yeast PRESENT   Comprehensive metabolic panel     Status: Abnormal   Collection Time: 11/09/22  8:02 PM  Result Value Ref Range   Sodium 139 135 - 145 mmol/L   Potassium 5.0 3.5 - 5.1 mmol/L   Chloride 111 98 - 111 mmol/L   CO2 18 (L) 22 - 32 mmol/L   Glucose, Bld 126 (H) 70 - 99 mg/dL   BUN 40 (H) 8 - 23 mg/dL   Creatinine, Ser 1.90 (H) 0.44 - 1.00 mg/dL   Calcium 8.5 (L) 8.9 - 10.3 mg/dL   Total Protein 6.7 6.5 - 8.1 g/dL   Albumin 2.7 (L) 3.5 - 5.0 g/dL   AST 26 15 - 41 U/L   ALT 19 0 - 44 U/L   Alkaline Phosphatase 140 (H) 38 - 126 U/L   Total Bilirubin 0.6 0.3 - 1.2 mg/dL   GFR, Estimated 28 (L) >60 mL/min   Anion gap 10 5 - 15  Lipase, blood     Status: None   Collection Time: 11/09/22  8:02 PM  Result Value Ref Range   Lipase 32 11 - 51 U/L  CBC with Differential     Status: Abnormal   Collection Time: 11/09/22  8:02 PM  Result Value Ref Range   WBC 11.7 (H) 4.0 - 10.5 K/uL   RBC 4.12 3.87 - 5.11 MIL/uL   Hemoglobin 11.7 (L) 12.0 - 15.0 g/dL   HCT 37.8 36.0 - 46.0 %   MCV 91.7 80.0 - 100.0 fL   MCH 28.4 26.0 - 34.0 pg   MCHC 31.0 30.0 - 36.0 g/dL   RDW 15.1 11.5 - 15.5 %   Platelets 274 150 - 400 K/uL   nRBC 0.0 0.0 - 0.2 %   Neutrophils Relative % 88 %   Neutro Abs 10.2 (H) 1.7 - 7.7 K/uL   Lymphocytes Relative 7 %   Lymphs Abs 0.9 0.7 - 4.0 K/uL   Monocytes Relative 3 %   Monocytes Absolute 0.4 0.1 - 1.0 K/uL   Eosinophils  Relative 2 %   Eosinophils Absolute 0.3 0.0 - 0.5 K/uL   Basophils Relative 0 %   Basophils Absolute 0.0  0.0 - 0.1 K/uL   Immature Granulocytes 0 %   Abs Immature Granulocytes 0.05 0.00 - 0.07 K/uL  C Difficile Quick Screen w PCR reflex     Status: Abnormal   Collection Time: 11/09/22  9:03 PM   Specimen: Urine, Clean Catch; Stool  Result Value Ref Range   C Diff antigen POSITIVE (A) NEGATIVE   C Diff toxin POSITIVE (A) NEGATIVE   C Diff interpretation Toxin producing C. difficile detected.   Gastrointestinal Panel by PCR , Stool     Status: None   Collection Time: 11/09/22  9:03 PM   Specimen: Urine, Clean Catch; Stool  Result Value Ref Range   Campylobacter species NOT DETECTED NOT DETECTED   Plesimonas shigelloides NOT DETECTED NOT DETECTED   Salmonella species NOT DETECTED NOT DETECTED   Yersinia enterocolitica NOT DETECTED NOT DETECTED   Vibrio species NOT DETECTED NOT DETECTED   Vibrio cholerae NOT DETECTED NOT DETECTED   Enteroaggregative E coli (EAEC) NOT DETECTED NOT DETECTED   Enteropathogenic E coli (EPEC) NOT DETECTED NOT DETECTED   Enterotoxigenic E coli (ETEC) NOT DETECTED NOT DETECTED   Shiga like toxin producing E coli (STEC) NOT DETECTED NOT DETECTED   Shigella/Enteroinvasive E coli (EIEC) NOT DETECTED NOT DETECTED   Cryptosporidium NOT DETECTED NOT DETECTED   Cyclospora cayetanensis NOT DETECTED NOT DETECTED   Entamoeba histolytica NOT DETECTED NOT DETECTED   Giardia lamblia NOT DETECTED NOT DETECTED   Adenovirus F40/41 NOT DETECTED NOT DETECTED   Astrovirus NOT DETECTED NOT DETECTED   Norovirus GI/GII NOT DETECTED NOT DETECTED   Rotavirus A NOT DETECTED NOT DETECTED   Sapovirus (I, II, IV, and V) NOT DETECTED NOT DETECTED  Resp panel by RT-PCR (RSV, Flu A&B, Covid) Urine, Clean Catch     Status: Abnormal   Collection Time: 11/09/22  9:04 PM   Specimen: Urine, Clean Catch; Nasal Swab  Result Value Ref Range   SARS Coronavirus 2 by RT PCR POSITIVE (A)  NEGATIVE   Influenza A by PCR NEGATIVE NEGATIVE   Influenza B by PCR NEGATIVE NEGATIVE   Resp Syncytial Virus by PCR NEGATIVE NEGATIVE    Unresulted Labs (From admission, onward)     Start     Ordered   11/10/22 0500  CBC  Tomorrow morning,   STAT        11/10/22 0000   11/10/22 0500  Comprehensive metabolic panel  Once,   STAT        11/10/22 0500   11/10/22 0500  Magnesium  Once,   STAT        11/10/22 0500   11/10/22 0500  Protime-INR  Once,   STAT        11/10/22 0500   11/10/22 0500  Procalcitonin  Once,   R       References:    Procalcitonin Lower Respiratory Tract Infection AND Sepsis Procalcitonin Algorithm   11/10/22 0500   11/10/22 0004  Type and screen  Once,   R        11/10/22 0003   11/10/22 0003  Culture, blood (Routine X 2) w Reflex to ID Panel  BLOOD CULTURE X 2,   R      11/10/22 0002   11/09/22 2352  Magnesium  Add-on,   AD        11/10/22 0000   11/09/22 2349  HIV Antibody (routine testing w rflx)  (HIV Antibody (Routine testing w reflex) panel)  Once,   URGENT  11/10/22 0000   11/09/22 2348  Lactic acid, plasma  STAT Now then every 3 hours,   STAT      11/09/22 2347   11/09/22 2235  Urine Culture (for pregnant, neutropenic or urologic patients or patients with an indwelling urinary catheter)  (Urine Labs)  Add-on,   AD       Question:  Indication  Answer:  Suprapubic pain   11/09/22 2234           Pt has received : Orders Placed This Encounter  Procedures   C Difficile Quick Screen w PCR reflex    Standing Status:   Standing    Number of Occurrences:   1   Gastrointestinal Panel by PCR , Stool    Standing Status:   Standing    Number of Occurrences:   1   Resp panel by RT-PCR (RSV, Flu A&B, Covid) Anterior Nasal Swab    Standing Status:   Standing    Number of Occurrences:   1   Urine Culture (for pregnant, neutropenic or urologic patients or patients with an indwelling urinary catheter)    Standing Status:   Standing    Number of  Occurrences:   1    Order Specific Question:   Indication    Answer:   Suprapubic pain   Culture, blood (Routine X 2) w Reflex to ID Panel    Standing Status:   Standing    Number of Occurrences:   2   Urinalysis, Routine w reflex microscopic -Urine, Clean Catch    Standing Status:   Standing    Number of Occurrences:   1    Order Specific Question:   Specimen Source    Answer:   Urine, Clean Catch [76]   Comprehensive metabolic panel    Standing Status:   Standing    Number of Occurrences:   1   Lipase, blood    Standing Status:   Standing    Number of Occurrences:   1   CBC with Differential    Standing Status:   Standing    Number of Occurrences:   1   Lactic acid, plasma    Standing Status:   Standing    Number of Occurrences:   2   HIV Antibody (routine testing w rflx)    Standing Status:   Standing    Number of Occurrences:   1   CBC    Standing Status:   Standing    Number of Occurrences:   1   Magnesium    Standing Status:   Standing    Number of Occurrences:   1   Comprehensive metabolic panel    Standing Status:   Standing    Number of Occurrences:   1   Magnesium    Standing Status:   Standing    Number of Occurrences:   1   Protime-INR    Standing Status:   Standing    Number of Occurrences:   1   Procalcitonin    Standing Status:   Standing    Number of Occurrences:   1   Diet full liquid Room service appropriate? Yes; Fluid consistency: Thin    Standing Status:   Standing    Number of Occurrences:   1    Order Specific Question:   Room service appropriate?    Answer:   Yes    Order Specific Question:   Fluid consistency:    Answer:   Thin  Vital signs    Standing Status:   Standing    Number of Occurrences:   1   Notify physician (specify)    Standing Status:   Standing    Number of Occurrences:   20    Order Specific Question:   Notify Physician    Answer:   for pulse less than 55 or greater than 120    Order Specific Question:   Notify  Physician    Answer:   for respiratory rate less than 12 or greater than 25    Order Specific Question:   Notify Physician    Answer:   for temperature greater than 100.5 F    Order Specific Question:   Notify Physician    Answer:   for urinary output less than 30 mL/hr for four hours    Order Specific Question:   Notify Physician    Answer:   for systolic BP less than 90 or greater than 0000000, diastolic BP less than 60 or greater than 100    Order Specific Question:   Notify Physician    Answer:   for new hypoxia w/ oxygen saturations < 88%   Progressive Mobility Protocol: No Restrictions    Standing Status:   Standing    Number of Occurrences:   1   If lactate (lactic acid) >2, verify repeat lactic acid order has been placed to be drawn    Standing Status:   Standing    Number of Occurrences:   1   Document vital signs within 1-hour of fluid bolus completion and notify provider of bolus completion    Standing Status:   Standing    Number of Occurrences:   1   Vital signs    Standing Status:   Standing    Number of Occurrences:   1   Vital signs    Standing Status:   Standing    Number of Occurrences:   1   RN to call RRT (rapid response team)    Standing Status:   Standing    Number of Occurrences:   1   Notify physician (specify) If patient in A-Fib, change in heart rhythm, or HR > 125 beat/min    If patient in A-Fib, change in heart rhythm, or HR > 125 beat/min    Standing Status:   Standing    Number of Occurrences:   1   Apply Sepsis Care Plan    Standing Status:   Standing    Number of Occurrences:   1   Refer to Sidebar Report: Sepsis Bundle ED/IP    Sepsis Bundle ED/IP    Standing Status:   Standing    Number of Occurrences:   1   Assess and Document Glasgow Coma Scale    Standing Status:   Standing    Number of Occurrences:   1   Initiate Oral Care Protocol    Standing Status:   Standing    Number of Occurrences:   1   Initiate Carrier Fluid Protocol    Standing  Status:   Standing    Number of Occurrences:   1   RN may order General Admission PRN Orders utilizing "General Admission PRN medications" (through manage orders) for the following patient needs: allergy symptoms (Claritin), cold sores (Carmex), cough (Robitussin DM), eye irritation (Liquifilm Tears), hemorrhoids (Tucks), indigestion (Maalox), minor skin irritation (Hydrocortisone Cream), muscle pain Suezanne Jacquet Gay), nose irritation (saline nasal spray) and sore throat (Chloraseptic spray).  Standing Status:   Standing    Number of Occurrences:   [phone]   Cardiac Monitoring - Continuous Indefinite    Standing Status:   Standing    Number of Occurrences:   1    Order Specific Question:   Indications for use:    Answer:   Other    Order Specific Question:   Other indications for use:    Answer:   SEPSIS/ ELECTROLYTE ABNORMALITY.   Full code    Standing Status:   Standing    Number of Occurrences:   1    Order Specific Question:   By:    Answer:   Other   Consult to hospitalist    Standing Status:   Standing    Number of Occurrences:   1    Order Specific Question:   Place call to:    Answer:   hospitalist    Order Specific Question:   Reason for Consult    Answer:   Admit    Order Specific Question:   Diagnosis/Clinical Info for Consult:    Answer:   cdif   Pharmacy Consult    Standing Status:   Standing    Number of Occurrences:   1    Order Specific Question:   Reason for Consult (*indicate specifics in comment field)    Answer:   Other (see comment)    Order Specific Question:   Comment:    Answer:   Treatment for SUSPECTED SEPSIS has been initiated. Follow patient to expedite delivery of core sepsis measures, specifically timely, antibiotic administration.   Droplet and Contact precautions    Standing Status:   Standing    Number of Occurrences:   1   Pulse oximetry check with vital signs    Standing Status:   Standing    Number of Occurrences:   1   Pulse oximetry (single)     Standing Status:   Standing    Number of Occurrences:   [phone]   Oxygen therapy Mode or (Route): Nasal cannula; Liters Per Minute: 2; Keep 02 saturation: greater than 92 %    Standing Status:   Standing    Number of Occurrences:   20    Order Specific Question:   Mode or (Route)    Answer:   Nasal cannula    Order Specific Question:   Liters Per Minute    Answer:   2    Order Specific Question:   Keep 02 saturation    Answer:   greater than 92 %   Type and screen    Standing Status:   Standing    Number of Occurrences:   1   Admit to Inpatient (patient's expected length of stay will be greater than 2 midnights or inpatient only procedure)    Standing Status:   Standing    Number of Occurrences:   1    Order Specific Question:   Hospital Area    Answer:   [place] [id number]    Order Specific Question:   Level of Care    Answer:   Telemetry Cardiac [103]    Order Specific Question:   Covid Evaluation    Answer:   Confirmed COVID Positive    Order Specific Question:   Diagnosis    Answer:   Nausea vomiting and diarrhea [id number]    Order Specific Question:   Admitting Physician    Answer:   Cherylann Ratel  Order Specific Question:   Attending Physician    Answer:   Cherylann Ratel    Order Specific Question:   Certification:    Answer:   I certify this patient will need inpatient services for at least 2 midnights    Order Specific Question:   Estimated Length of Stay    Answer:   5   Fall precautions    Standing Status:   Standing    Number of Occurrences:   1   Aspiration precautions    Standing Status:   Standing    Number of Occurrences:   1    Meds ordered this encounter  Medications   sodium chloride 0.9 % bolus 1,000 mL   ondansetron (ZOFRAN) injection 4 mg   diphenoxylate-atropine (LOMOTIL) 2.5-0.025 MG per tablet 1 tablet   vancomycin (VANCOCIN) capsule 125 mg   OR Linked Order Group    acetaminophen (TYLENOL) tablet 650 mg     acetaminophen (TYLENOL) suppository 650 mg   heparin injection 5,000 Units   OR Linked Order Group    ondansetron (ZOFRAN) tablet 4 mg    ondansetron (ZOFRAN) injection 4 mg     Admission Imaging : No results found.  Physical Examination: Vitals:   11/09/22 1952 11/09/22 1957 11/09/22 1958 11/09/22 2332  BP:  110/72  122/72  Pulse:  (!) 107  (!) 102  Temp:  98.5 F (36.9 C)  98 F (36.7 C)  Resp:  20  15  Height:   '5\' 4"'$  (1.626 m)   Weight:   69.4 kg   SpO2: 97% 98%  99%  TempSrc:  Oral  Oral  BMI (Calculated):   26.25    Physical Exam Vitals and nursing note reviewed.  Constitutional:      General: She is not in acute distress.    Appearance: Normal appearance. She is not ill-appearing, toxic-appearing or diaphoretic.  HENT:     Head: Normocephalic and atraumatic.     Right Ear: Hearing and external ear normal.     Left Ear: Hearing and external ear normal.     Nose: Nose normal. No nasal deformity.     Mouth/Throat:     Lips: Pink.     Mouth: Mucous membranes are moist.     Tongue: No lesions.     Pharynx: Oropharynx is clear.  Eyes:     Extraocular Movements: Extraocular movements intact.     Pupils: Pupils are equal, round, and reactive to light.  Neck:     Vascular: No carotid bruit.  Cardiovascular:     Rate and Rhythm: Normal rate and regular rhythm.     Pulses: Normal pulses.     Heart sounds: Normal heart sounds.  Pulmonary:     Effort: Pulmonary effort is normal.     Breath sounds: Normal breath sounds.  Abdominal:     General: Bowel sounds are normal. There is no distension.     Palpations: Abdomen is soft. There is no mass.     Tenderness: There is no abdominal tenderness. There is no guarding.     Hernia: No hernia is present.  Musculoskeletal:     Right lower leg: No edema.     Left lower leg: No edema.  Skin:    General: Skin is warm.  Neurological:     General: No focal deficit present.     Mental Status: She is alert and oriented to  person, place, and time.     Cranial Nerves:  Cranial nerves 2-12 are intact.     Motor: Motor function is intact.  Psychiatric:        Attention and Perception: Attention normal.        Mood and Affect: Mood normal.        Speech: Speech normal.        Behavior: Behavior normal. Behavior is cooperative.        Cognition and Memory: Cognition normal.     Assessment and Plan: * Nausea vomiting and diarrhea Combination of C.diff and Covid illness. Supportive care with MIVF and diet as tolerated starting with clear liquid.    Sepsis ([place]) Pt meets sepsis criteria and lactic pending.  Cont t/t with po vancomycin.  Blood cultures to be collected.   Anemia    Latest Ref Rng & Units 11/09/2022    8:02 PM 08/28/2022    6:09 PM 05/01/2022    8:30 PM  CBC  WBC 4.0 - 10.5 K/uL 11.7  10.3  3.0   Hemoglobin 12.0 - 15.0 g/dL 11.7  12.9  7.4   Hematocrit 36.0 - 46.0 % 37.8  42.3  25.3   Platelets 150 - 400 K/uL 274  161  152   STOP diclofenac. IV PPI.  Acute kidney injury superimposed on chronic kidney disease ([place]) Lab Results  Component Value Date   CREATININE 1.90 (H) 11/09/2022   CREATININE 1.28 (H) 08/28/2022   CREATININE 1.54 (H) 05/01/2022  We will cont with gentle mivf. Avoid contrast. Renally dose meds.    COVID-19 virus infection Pt is covid positive. SpO2: 98 % on RA. Will hold po meds due to N/V. Covid isolation.    Clostridium difficile diarrhea Vitals:   11/09/22 1952 11/09/22 1957 11/09/22 1958  BP:  110/72   Pulse:  (!) 107   Temp:  98.5 F (36.9 C)   Resp:  20   Height:   '5\' 4"'$  (1.626 m)  Weight:   69.4 kg  SpO2: 97% 98%   TempSrc:  Oral   BMI (Calculated):   26.25  Pt is at high risk for c.diff due to her chronic antibiotic use. We will continue PO Vancomycin.     UTI (urinary tract infection) We will treat once cultures resulted. Pt is asymptomatic. Urinalysis    Component Value Date/Time   COLORURINE YELLOW (A) 11/09/2022 2002    APPEARANCEUR CLOUDY (A) 11/09/2022 2002   APPEARANCEUR Cloudy (A) 10/21/2018 0841   LABSPEC 1.015 11/09/2022 2002   PHURINE 5.0 11/09/2022 2002   GLUCOSEU NEGATIVE 11/09/2022 2002   HGBUR SMALL (A) 11/09/2022 2002   BILIRUBINUR NEGATIVE 11/09/2022 2002   BILIRUBINUR negative 07/15/2020 0827   BILIRUBINUR Negative 10/21/2018 [place] 11/09/2022 2002   PROTEINUR NEGATIVE 11/09/2022 2002   UROBILINOGEN 0.2 07/15/2020 0827   NITRITE NEGATIVE 11/09/2022 2002   LEUKOCYTESUR LARGE (A) 11/09/2022 2002        DVT prophylaxis:  Heparin.   Code Status:  Full Code.    Family Communication:  Dixielee, Lamour (Spouse) [phone]    Disposition Plan:  TBD.   Consults called:  None.  Admission status: Inpatient.    Unit/ Expected LOS: Telemetry medical / 5 days.    Para Skeans MD [facility]  6 PM- 2 AM. Please contact me via secure Chat 6 PM-2 AM. [phone]( Pager ) To contact the [facility] Attending or Consulting provider [place] or covering provider during after hours [place], for this patient.   Check the care team  in [facility] and look for a) attending/consulting [facility] provider listed and b) the [facility] team listed Log into [email or web address] and use [place]'s universal password to access. If you do not have the password, please contact the hospital operator. Locate the [facility] provider you are looking for under [facility] and page to a number that you can be directly reached. If you still have difficulty reaching the provider, please page the [facility] (Director on Call) for the Hospitalists listed on amion for assistance. [email or web address] 11/10/2022, 12:32 AM

## 2022-11-09 NOTE — Assessment & Plan Note (Signed)
    Latest Ref Rng & Units 11/09/2022    8:02 PM 08/28/2022    6:09 PM 05/01/2022    8:30 PM  CBC  WBC 4.0 - 10.5 K/uL 11.7  10.3  3.0   Hemoglobin 12.0 - 15.0 g/dL 11.7  12.9  7.4   Hematocrit 36.0 - 46.0 % 37.8  42.3  25.3   Platelets 150 - 400 K/uL 274  161  152   STOP diclofenac. IV PPI.

## 2022-11-09 NOTE — ED Triage Notes (Signed)
Pt BIBA from home. Pt reports N/V/D for 2 days. Pt denies fevers. Pt reports being susceptible to bladder infections. Pt reports being on prophylactic antibiotics. Pt states that it is starting to burn down there but doesn't know if its just raw from the diarrhea. Pt A&Ox4.

## 2022-11-09 NOTE — ED Provider Notes (Signed)
[facility] Provider Note    Event Date/Time   First MD Initiated Contact with Patient 11/09/22 1958     (approximate)   History   Nausea, Emesis, and Diarrhea   HPI  Jeanne [name] is a 72 y.o. female who presents to the emergency department today because of concerns for nausea vomiting and diarrhea.  Symptoms started 2 days ago.  She states she has had profound amounts of diarrhea.  She denies any blood in it.  Denies any significant associated abdominal pain.  Talk to her doctor over the Internet who prescribed her medication which she states has not been helping.  She denies any known sick contacts.  No new or change in medication.     Physical Exam   Triage Vital Signs: ED Triage Vitals  Enc Vitals Group     BP 11/09/22 1957 110/72     Pulse Rate 11/09/22 1957 (!) 107     Resp 11/09/22 1957 20     Temp 11/09/22 1957 98.5 F (36.9 C)     Temp Source 11/09/22 1957 Oral     SpO2 11/09/22 1952 97 %     Weight 11/09/22 1958 153 lb (69.4 kg)     Height 11/09/22 1958 '5\' 4"'$  (1.626 m)     Head Circumference --      Peak Flow --      Pain Score 11/09/22 1957 7     Pain Loc --      Pain Edu? --      Excl. in [place]? --     Most recent vital signs: Vitals:   11/09/22 1952 11/09/22 1957  BP:  110/72  Pulse:  (!) 107  Resp:  20  Temp:  98.5 F (36.9 C)  SpO2: 97% 98%   General: Awake, alert, oriented. CV:  Good peripheral perfusion. Tachycardia. Resp:  Normal effort. Lungs clear. Abd:  No distention.    ED Results / Procedures / Treatments   Labs (all labs ordered are listed, but only abnormal results are displayed) Labs Reviewed  C DIFFICILE QUICK SCREEN W PCR REFLEX   - Abnormal; Notable for the following components:      Result Value   C Diff antigen POSITIVE (*)    C Diff toxin POSITIVE (*)    All other components within normal limits  RESP PANEL BY RT-PCR (RSV, FLU A&B, COVID)  RVPGX2 - Abnormal; Notable for the following  components:   SARS Coronavirus 2 by RT PCR POSITIVE (*)    All other components within normal limits  URINALYSIS, ROUTINE W REFLEX MICROSCOPIC - Abnormal; Notable for the following components:   Color, Urine YELLOW (*)    APPearance CLOUDY (*)    Hgb urine dipstick SMALL (*)    Leukocytes,Ua LARGE (*)    Bacteria, UA RARE (*)    All other components within normal limits  COMPREHENSIVE METABOLIC PANEL - Abnormal; Notable for the following components:   CO2 18 (*)    Glucose, Bld 126 (*)    BUN 40 (*)    Creatinine, Ser 1.90 (*)    Calcium 8.5 (*)    Albumin 2.7 (*)    Alkaline Phosphatase 140 (*)    GFR, Estimated 28 (*)    All other components within normal limits  CBC WITH DIFFERENTIAL/PLATELET - Abnormal; Notable for the following components:   WBC 11.7 (*)    Hemoglobin 11.7 (*)    Neutro Abs 10.2 (*)  All other components within normal limits  GASTROINTESTINAL PANEL BY PCR, STOOL (REPLACES STOOL CULTURE)  URINE CULTURE  LIPASE, BLOOD     EKG  None   RADIOLOGY None   PROCEDURES:  Critical Care performed: No  Procedures    MEDICATIONS ORDERED IN ED: Medications - No data to display   IMPRESSION / MDM / [place] / ED COURSE  I reviewed the triage vital signs and the nursing notes.                              Differential diagnosis includes, but is not limited to, viral infection, bacterial infection, food poisoning, dehydration  Patient's presentation is most consistent with acute presentation with potential threat to life or bodily function.  The patient is on the cardiac monitor to evaluate for evidence of arrhythmia and/or significant heart rate changes.  Patient presented to the emergency department today because of concerns for nausea vomiting diarrhea x 2 days.  She states she has had profound amount of diarrhea.  On exam patient does appear slightly dehydrated.  She was tachycardic.  Blood work shows a slight AKI and leukocytosis.   Stool studies were positive for C. difficile.  I do think this could explain the patient's diarrhea.  Given concern for continued diarrhea here in the emergency department and signs of patient already suffering from some dehydration do think she would benefit from further management.  Will start antibiotics.  Discussed with Dr. Posey Pronto with the hospitalist service who will plan on admission.      FINAL CLINICAL IMPRESSION(S) / ED DIAGNOSES   Final diagnoses:  Dehydration  Diarrhea of infectious origin  C. difficile diarrhea    Note:  This document was prepared using Dragon voice recognition software and may include unintentional dictation errors.    Nance Pear, MD 11/09/22 2249

## 2022-11-09 NOTE — Assessment & Plan Note (Addendum)
Vitals:   11/09/22 1952 11/09/22 1957 11/09/22 1958  BP:  110/72   Pulse:  (!) 107   Temp:  98.5 F (36.9 C)   Resp:  20   Height:   '5\' 4"'$  (1.626 m)  Weight:   69.4 kg  SpO2: 97% 98%   TempSrc:  Oral   BMI (Calculated):   26.25  Pt is at high risk for c.diff due to her chronic antibiotic use. We will continue PO Vancomycin.

## 2022-11-09 NOTE — ED Notes (Signed)
Report to [facility]

## 2022-11-09 NOTE — Assessment & Plan Note (Addendum)
Pt is covid positive. SpO2: 98 % on RA. Will hold po meds due to N/V. Covid isolation.

## 2022-11-09 NOTE — Assessment & Plan Note (Addendum)
Combination of C.diff and Covid illness. Supportive care with MIVF and diet as tolerated starting with clear liquid.

## 2022-11-09 NOTE — Assessment & Plan Note (Signed)
Lab Results  Component Value Date   CREATININE 1.90 (H) 11/09/2022   CREATININE 1.28 (H) 08/28/2022   CREATININE 1.54 (H) 05/01/2022  We will cont with gentle mivf. Avoid contrast. Renally dose meds.

## 2022-11-10 ENCOUNTER — Other Ambulatory Visit: Payer: Self-pay

## 2022-11-10 DIAGNOSIS — N179 Acute kidney failure, unspecified: Secondary | ICD-10-CM | POA: Diagnosis not present

## 2022-11-10 DIAGNOSIS — A419 Sepsis, unspecified organism: Secondary | ICD-10-CM | POA: Diagnosis present

## 2022-11-10 DIAGNOSIS — Z87440 Personal history of urinary (tract) infections: Secondary | ICD-10-CM | POA: Diagnosis not present

## 2022-11-10 DIAGNOSIS — Z90710 Acquired absence of both cervix and uterus: Secondary | ICD-10-CM | POA: Diagnosis not present

## 2022-11-10 DIAGNOSIS — G8929 Other chronic pain: Secondary | ICD-10-CM | POA: Diagnosis not present

## 2022-11-10 DIAGNOSIS — Z8541 Personal history of malignant neoplasm of cervix uteri: Secondary | ICD-10-CM | POA: Diagnosis not present

## 2022-11-10 DIAGNOSIS — R197 Diarrhea, unspecified: Secondary | ICD-10-CM | POA: Diagnosis not present

## 2022-11-10 DIAGNOSIS — R6520 Severe sepsis without septic shock: Secondary | ICD-10-CM | POA: Diagnosis not present

## 2022-11-10 DIAGNOSIS — R112 Nausea with vomiting, unspecified: Secondary | ICD-10-CM | POA: Diagnosis not present

## 2022-11-10 DIAGNOSIS — Z96651 Presence of right artificial knee joint: Secondary | ICD-10-CM | POA: Diagnosis not present

## 2022-11-10 DIAGNOSIS — M069 Rheumatoid arthritis, unspecified: Secondary | ICD-10-CM | POA: Diagnosis not present

## 2022-11-10 DIAGNOSIS — N189 Chronic kidney disease, unspecified: Secondary | ICD-10-CM | POA: Diagnosis not present

## 2022-11-10 DIAGNOSIS — Z885 Allergy status to narcotic agent status: Secondary | ICD-10-CM | POA: Diagnosis not present

## 2022-11-10 DIAGNOSIS — D631 Anemia in chronic kidney disease: Secondary | ICD-10-CM | POA: Diagnosis not present

## 2022-11-10 DIAGNOSIS — U071 COVID-19: Secondary | ICD-10-CM | POA: Diagnosis not present

## 2022-11-10 DIAGNOSIS — M009 Pyogenic arthritis, unspecified: Secondary | ICD-10-CM | POA: Diagnosis not present

## 2022-11-10 DIAGNOSIS — N390 Urinary tract infection, site not specified: Secondary | ICD-10-CM | POA: Diagnosis not present

## 2022-11-10 DIAGNOSIS — Z923 Personal history of irradiation: Secondary | ICD-10-CM | POA: Diagnosis not present

## 2022-11-10 DIAGNOSIS — Z79891 Long term (current) use of opiate analgesic: Secondary | ICD-10-CM | POA: Diagnosis not present

## 2022-11-10 DIAGNOSIS — Z792 Long term (current) use of antibiotics: Secondary | ICD-10-CM | POA: Diagnosis not present

## 2022-11-10 DIAGNOSIS — Z87442 Personal history of urinary calculi: Secondary | ICD-10-CM | POA: Diagnosis not present

## 2022-11-10 DIAGNOSIS — I129 Hypertensive chronic kidney disease with stage 1 through stage 4 chronic kidney disease, or unspecified chronic kidney disease: Secondary | ICD-10-CM | POA: Diagnosis not present

## 2022-11-10 DIAGNOSIS — Z96643 Presence of artificial hip joint, bilateral: Secondary | ICD-10-CM | POA: Diagnosis not present

## 2022-11-10 DIAGNOSIS — Z880 Allergy status to penicillin: Secondary | ICD-10-CM | POA: Diagnosis not present

## 2022-11-10 DIAGNOSIS — A0472 Enterocolitis due to Clostridium difficile, not specified as recurrent: Secondary | ICD-10-CM | POA: Diagnosis not present

## 2022-11-10 DIAGNOSIS — E860 Dehydration: Secondary | ICD-10-CM | POA: Diagnosis not present

## 2022-11-10 LAB — CBC
HCT: 31.1 % — ABNORMAL LOW (ref 36.0–46.0)
Hemoglobin: 9.5 g/dL — ABNORMAL LOW (ref 12.0–15.0)
MCH: 28.4 pg (ref 26.0–34.0)
MCHC: 30.5 g/dL (ref 30.0–36.0)
MCV: 93.1 fL (ref 80.0–100.0)
Platelets: 215 10*3/uL (ref 150–400)
RBC: 3.34 MIL/uL — ABNORMAL LOW (ref 3.87–5.11)
RDW: 15 % (ref 11.5–15.5)
WBC: 9.9 10*3/uL (ref 4.0–10.5)
nRBC: 0 % (ref 0.0–0.2)

## 2022-11-10 LAB — COMPREHENSIVE METABOLIC PANEL
ALT: 16 U/L (ref 0–44)
AST: 21 U/L (ref 15–41)
Albumin: 2.2 g/dL — ABNORMAL LOW (ref 3.5–5.0)
Alkaline Phosphatase: 118 U/L (ref 38–126)
Anion gap: 6 (ref 5–15)
BUN: 36 mg/dL — ABNORMAL HIGH (ref 8–23)
CO2: 15 mmol/L — ABNORMAL LOW (ref 22–32)
Calcium: 8 mg/dL — ABNORMAL LOW (ref 8.9–10.3)
Chloride: 116 mmol/L — ABNORMAL HIGH (ref 98–111)
Creatinine, Ser: 1.53 mg/dL — ABNORMAL HIGH (ref 0.44–1.00)
GFR, Estimated: 37 mL/min — ABNORMAL LOW (ref >60)
Glucose, Bld: 117 mg/dL — ABNORMAL HIGH (ref 70–99)
Potassium: 4.5 mmol/L (ref 3.5–5.1)
Sodium: 137 mmol/L (ref 135–145)
Total Bilirubin: 0.6 mg/dL (ref 0.3–1.2)
Total Protein: 5.4 g/dL — ABNORMAL LOW (ref 6.5–8.1)

## 2022-11-10 LAB — MAGNESIUM
Magnesium: 2 mg/dL (ref 1.7–2.4)
Magnesium: 1.7 mg/dL (ref 1.7–2.4)

## 2022-11-10 LAB — PROCALCITONIN: Procalcitonin: 0.53 ng/mL

## 2022-11-10 LAB — PROTIME-INR
INR: 1.4 — ABNORMAL HIGH (ref 0.8–1.2)
Prothrombin Time: 16.6 seconds — ABNORMAL HIGH (ref 11.4–15.2)

## 2022-11-10 LAB — LACTIC ACID, PLASMA
Lactic Acid, Venous: 1.1 mmol/L (ref 0.5–1.9)
Lactic Acid, Venous: 1.3 mmol/L (ref 0.5–1.9)

## 2022-11-10 LAB — HIV ANTIBODY (ROUTINE TESTING W REFLEX): HIV Screen 4th Generation wRfx: NONREACTIVE

## 2022-11-10 MED ORDER — LACTATED RINGERS IV SOLN: INTRAVENOUS | Status: DC

## 2022-11-10 MED ORDER — ENOXAPARIN SODIUM 40 MG/0.4ML IJ SOSY
40.0000 mg | PREFILLED_SYRINGE | INTRAMUSCULAR | Status: DC
  Administered 2022-11-10 – 2022-11-11 (×2): 40 mg via SUBCUTANEOUS
  Filled 2022-11-10 (×2): qty 0.4

## 2022-11-10 MED ORDER — ONDANSETRON HCL 4 MG PO TABS: 4.0000 mg | ORAL_TABLET | Freq: Four times a day (QID) | ORAL | Status: DC | PRN

## 2022-11-10 MED ORDER — HEPARIN SODIUM (PORCINE) 5000 UNIT/ML IJ SOLN
5000.0000 [IU] | Freq: Three times a day (TID) | INTRAMUSCULAR | Status: DC
  Administered 2022-11-10 (×3): 5000 [IU] via SUBCUTANEOUS
  Filled 2022-11-10 (×3): qty 1

## 2022-11-10 MED ORDER — FAMOTIDINE IN NACL 20-0.9 MG/50ML-% IV SOLN
20.0000 mg | Freq: Two times a day (BID) | INTRAVENOUS | Status: DC
  Administered 2022-11-10 (×2): 20 mg via INTRAVENOUS
  Filled 2022-11-10 (×2): qty 50

## 2022-11-10 MED ORDER — ACETAMINOPHEN 325 MG PO TABS
650.0000 mg | ORAL_TABLET | Freq: Four times a day (QID) | ORAL | Status: DC | PRN
  Administered 2022-11-11 (×2): 650 mg via ORAL
  Filled 2022-11-10 (×2): qty 2

## 2022-11-10 MED ORDER — ACETAMINOPHEN 650 MG RE SUPP
650.0000 mg | Freq: Four times a day (QID) | RECTAL | Status: DC | PRN
  Filled 2022-11-10: qty 1

## 2022-11-10 MED ORDER — FENTANYL CITRATE PF 50 MCG/ML IJ SOSY
25.0000 ug | PREFILLED_SYRINGE | Freq: Four times a day (QID) | INTRAMUSCULAR | Status: DC | PRN
  Administered 2022-11-10 (×2): 25 ug via INTRAVENOUS
  Filled 2022-11-10 (×2): qty 1

## 2022-11-10 MED ORDER — HYDROMORPHONE HCL 2 MG PO TABS
2.0000 mg | ORAL_TABLET | Freq: Four times a day (QID) | ORAL | Status: DC | PRN
  Administered 2022-11-10 – 2022-11-11 (×4): 2 mg via ORAL
  Filled 2022-11-10 (×4): qty 1

## 2022-11-10 MED ORDER — FAMOTIDINE 20 MG PO TABS
20.0000 mg | ORAL_TABLET | Freq: Every day | ORAL | Status: DC
  Administered 2022-11-11 – 2022-11-12 (×2): 20 mg via ORAL
  Filled 2022-11-10 (×2): qty 1

## 2022-11-10 MED ORDER — ONDANSETRON HCL 4 MG/2ML IJ SOLN: 4.0000 mg | Freq: Four times a day (QID) | INTRAMUSCULAR | Status: DC | PRN

## 2022-11-10 NOTE — Assessment & Plan Note (Signed)
Pt meets sepsis criteria and lactic pending.  Cont t/t with po vancomycin.  Blood cultures to be collected.

## 2022-11-10 NOTE — Evaluation (Signed)
Physical Therapy Evaluation Patient Details Name: Jeanne [name] MRN: [id number] DOB: August 05, 1951 Today's Date: 11/10/2022  History of Present Illness  Pt admitted for nausea/vomiting/diarrhea secondary to covid. Also + for Cdiff. History includes cervical cancer, HTN, and recent [facility] 06/2022.  Clinical Impression  Pt is a pleasant 72 year old female who was admitted for N/V/D. Pt feels low energy this date and is fearful of rectal tube displacement with mobility. Pt agreeable to sit at EOB with mod assist. Educated on benefits of mobility and decreasing risk of complications. She states she would like to continue with her current HHPT. Pt demonstrates deficits with strength/mobility. Would benefit from skilled PT to address above deficits and promote optimal return to PLOF. Recommend transition to [place] upon discharge from acute hospitalization.      Recommendations for follow up therapy are one component of a multi-disciplinary discharge planning process, led by the attending physician.  Recommendations may be updated based on patient status, additional functional criteria and insurance authorization.  Follow Up Recommendations Home health PT      Assistance Recommended at Discharge Intermittent Supervision/Assistance  Patient can return home with the following  A lot of help with walking and/or transfers;A lot of help with bathing/dressing/bathroom;Help with stairs or ramp for entrance;Assist for transportation    Equipment Recommendations None recommended by PT  Recommendations for Other Services       Functional Status Assessment Patient has had a recent decline in their functional status and demonstrates the ability to make significant improvements in function in a reasonable and predictable amount of time.     Precautions / Restrictions Precautions Precautions: Fall Restrictions Weight Bearing Restrictions: No      Mobility  Bed Mobility Overal bed mobility: Needs  Assistance Bed Mobility: Supine to Sit     Supine to sit: Mod assist     General bed mobility comments: needs assist for B LE management. Once seated at EOB, slight post lean, able to correct with cues. Fatigues quickly and didn't feel safe to further progress transfers at this time    Transfers                   General transfer comment: unable    Ambulation/Gait                  Stairs            Wheelchair Mobility    Modified Rankin (Stroke Patients Only)       Balance Overall balance assessment: Needs assistance Sitting-balance support: Feet supported, Bilateral upper extremity supported Sitting balance-Leahy Scale: Fair                                       Pertinent Vitals/Pain Pain Assessment Pain Assessment: Faces Faces Pain Scale: Hurts little more Pain Location: R hip Pain Descriptors / Indicators: Aching Pain Intervention(s): Limited activity within patient's tolerance, Repositioned    Home Living Family/patient expects to be discharged to:: Private residence Living Arrangements: Children;Spouse/significant other Available Help at Discharge: Family;Available 24 hours/day Type of Home: House Home Access: Ramped entrance       Home Layout: One level Home Equipment: Conservation officer, nature (2 wheels);Wheelchair - manual;Hospital bed;BSC/3in1 (pure wick system)      Prior Function Prior Level of Function : Needs assist             Mobility Comments: has been working  on ambulation with HHPT. Short distances. Uses WC for long distances. Has left house once since hip surgery ADLs Comments: needs assist     Hand Dominance        Extremity/Trunk Assessment   Upper Extremity Assessment Upper Extremity Assessment: Generalized weakness (B UE grossly 3/5; painful due to RA)    Lower Extremity Assessment Lower Extremity Assessment: Generalized weakness (B LE grossly 2/5)       Communication   Communication: No  difficulties  Cognition Arousal/Alertness: Awake/alert Behavior During Therapy: WFL for tasks assessed/performed Overall Cognitive Status: Within Functional Limits for tasks assessed                                 General Comments: pleasant and agreeable to session        General Comments      Exercises     Assessment/Plan    PT Assessment Patient needs continued PT services  PT Problem List Decreased strength;Decreased activity tolerance;Decreased balance;Decreased mobility       PT Treatment Interventions Gait training;DME instruction;Therapeutic exercise;Balance training    PT Goals (Current goals can be found in the Care Plan section)  Acute Rehab PT Goals Patient Stated Goal: to go home PT Goal Formulation: With patient Time For Goal Achievement: 11/24/22 Potential to Achieve Goals: Good    Frequency Min 2X/week     Co-evaluation               AM-PAC PT "6 Clicks" Mobility  Outcome Measure Help needed turning from your back to your side while in a flat bed without using bedrails?: A Little Help needed moving from lying on your back to sitting on the side of a flat bed without using bedrails?: A Little Help needed moving to and from a bed to a chair (including a wheelchair)?: Total Help needed standing up from a chair using your arms (e.g., wheelchair or bedside chair)?: Total Help needed to walk in hospital room?: Total Help needed climbing 3-5 steps with a railing? : Total 6 Click Score: 10    End of Session Equipment Utilized During Treatment:  (rectal tube) Activity Tolerance: Patient limited by fatigue Patient left: in bed;with bed alarm set Nurse Communication: Mobility status PT Visit Diagnosis: Muscle weakness (generalized) (M62.81);Difficulty in walking, not elsewhere classified (R26.2)    Time: 1440-1500 PT Time Calculation (min) (ACUTE ONLY): 20 min   Charges:   PT Evaluation $PT Eval Moderate Complexity: 1 [address], PT, DPT, GCS [phone]   Michalle Rademaker 11/10/2022, 3:43 PM

## 2022-11-10 NOTE — Progress Notes (Signed)
  PROGRESS NOTE    Jeanne [name]  [id number] DOB: [birth year] DOA: 11/09/2022 PCP: [place] Pink, Jeanne  231A/231A-AA  LOS: 0 days   Brief hospital course:   Assessment & Plan: Jeanne [name] is a 72 y.o.  female with medical history significant for recurrent UTI, chronic pain on chronic opioids who presented with N/V/D     Diarrhea 2/2 C diff infection --started on oral Vanc, and rectal tube inserted --cont oral Vanc --cont rectal tube for now --cont MIVF'@75'$   Severe Sepsis ([place]) --tachycardia, leukocytosis, with C diff infection, with AKI  Acute kidney injury superimposed on chronic kidney disease ([place]) --Cr 1.9 on presentation, improved to 1.53 next day with IVF. --cont MIVF'@75'$   COVID-19 virus infection --no respiratory symptoms.    Pyuria --hold abx for now   DVT prophylaxis: Lovenox SQ Code Status: Full code  Family Communication:  Level of care: Med-Surg Dispo:   The patient is from: home Anticipated d/c is to: home Anticipated d/c date is: 2-3 days   Subjective and Interval History:  Pt complained of chronic arthritis pain.     Objective: Vitals:   11/10/22 0414 11/10/22 0731 11/10/22 1153 11/10/22 1201  BP: (!) 102/57 99/61 (!) 95/54   Pulse: 92 88 (!) 108   Resp: '17 15 16   '$ Temp: 98.7 F (37.1 C) 98.2 F (36.8 C) 98.5 F (36.9 C)   TempSrc:  Oral    SpO2: 100% 100% 100%   Weight:    68.1 kg  Height:        Intake/Output Summary (Last 24 hours) at 11/10/2022 1501 Last data filed at 11/10/2022 1301 Gross per 24 hour  Intake 799.96 ml  Output 750 ml  Net 49.96 ml   Filed Weights   11/09/22 1958 11/10/22 0118 11/10/22 1201  Weight: 69.4 kg 68.1 kg 68.1 kg    Examination:   Constitutional: NAD, AAOx3 HEENT: conjunctivae and lids normal, EOMI CV: No cyanosis.   RESP: normal respiratory effort, on RA Neuro: II - XII grossly intact.   Psych: Normal mood and affect.  Appropriate judgement and reason Rectal tube present   Data  Reviewed: I have personally reviewed labs and imaging studies  Time spent: 50 minutes  Enzo Bi, Jeanne [facility] If 7PM-7AM, please contact night-coverage 11/10/2022, 3:01 PM

## 2022-11-11 DIAGNOSIS — R112 Nausea with vomiting, unspecified: Secondary | ICD-10-CM | POA: Diagnosis not present

## 2022-11-11 DIAGNOSIS — R197 Diarrhea, unspecified: Secondary | ICD-10-CM | POA: Diagnosis not present

## 2022-11-11 LAB — BASIC METABOLIC PANEL
Anion gap: 6 (ref 5–15)
BUN: 27 mg/dL — ABNORMAL HIGH (ref 8–23)
CO2: 16 mmol/L — ABNORMAL LOW (ref 22–32)
Calcium: 8 mg/dL — ABNORMAL LOW (ref 8.9–10.3)
Chloride: 111 mmol/L (ref 98–111)
Creatinine, Ser: 1.35 mg/dL — ABNORMAL HIGH (ref 0.44–1.00)
GFR, Estimated: 43 mL/min — ABNORMAL LOW (ref >60)
Glucose, Bld: 107 mg/dL — ABNORMAL HIGH (ref 70–99)
Potassium: 4.4 mmol/L (ref 3.5–5.1)
Sodium: 133 mmol/L — ABNORMAL LOW (ref 135–145)

## 2022-11-11 LAB — CBC
HCT: 30.8 % — ABNORMAL LOW (ref 36.0–46.0)
Hemoglobin: 9.8 g/dL — ABNORMAL LOW (ref 12.0–15.0)
MCH: 28.9 pg (ref 26.0–34.0)
MCHC: 31.8 g/dL (ref 30.0–36.0)
MCV: 90.9 fL (ref 80.0–100.0)
Platelets: 159 10*3/uL (ref 150–400)
RBC: 3.39 MIL/uL — ABNORMAL LOW (ref 3.87–5.11)
RDW: 14.9 % (ref 11.5–15.5)
WBC: 5.4 10*3/uL (ref 4.0–10.5)
nRBC: 0 % (ref 0.0–0.2)

## 2022-11-11 LAB — URINE CULTURE: Culture: >=100000 — AB

## 2022-11-11 LAB — MAGNESIUM: Magnesium: 1.7 mg/dL (ref 1.7–2.4)

## 2022-11-11 MED ORDER — HYDROMORPHONE HCL 2 MG PO TABS
2.0000 mg | ORAL_TABLET | ORAL | Status: DC | PRN
  Administered 2022-11-11 – 2022-11-12 (×3): 2 mg via ORAL
  Filled 2022-11-11 (×3): qty 1

## 2022-11-11 MED ORDER — DICLOFENAC SODIUM 75 MG PO TBEC
75.0000 mg | DELAYED_RELEASE_TABLET | Freq: Two times a day (BID) | ORAL | Status: DC
  Administered 2022-11-11 – 2022-11-12 (×2): 75 mg via ORAL
  Filled 2022-11-11 (×2): qty 1

## 2022-11-11 NOTE — Progress Notes (Signed)
  PROGRESS NOTE    Jeanne [name]  [id number] DOB: 27-Oct-1950 DOA: 11/09/2022 PCP: [place] Pink, MD  231A/231A-AA  LOS: 1 day   Brief hospital course:   Assessment & Plan: Jeanne [name] is a 72 y.o.  female with medical history significant for recurrent UTI, chronic pain on chronic opioids who presented with N/V/D     Diarrhea 2/2 C diff infection --started on oral Vanc, and rectal tube inserted Plan: --cont oral Vanc --d/c rectal tube today --cont MIVF'@50'$  --advance to soft diet  Severe Sepsis ([place]) --tachycardia, leukocytosis, with C diff infection, with AKI  Acute kidney injury superimposed on chronic kidney disease ([place]) --Cr 1.9 on presentation, improved to 1.53 next day with IVF. --cont MIVF'@50'$   COVID-19 virus infection --no respiratory symptoms.    Pyuria --urine cx pos for yeast.  Currently no dysuria --no need for abx   DVT prophylaxis: Lovenox SQ Code Status: Full code  Family Communication:  Level of care: Med-Surg Dispo:   The patient is from: home Anticipated d/c is to: home Anticipated d/c date is: 1-2 days   Subjective and Interval History:  Pt complained of pain and not feeling well.  Less loose stool output.   Objective: Vitals:   11/10/22 1951 11/11/22 0453 11/11/22 0802 11/11/22 0902  BP: (!) 97/53 (!) 117/54 125/60 (!) 109/55  Pulse: 97 100 (!) 120 (!) 120  Resp:   16 16  Temp: 99.3 F (37.4 C) 98.9 F (37.2 C) 100.3 F (37.9 C) 99.5 F (37.5 C)  TempSrc: Oral Oral Oral Oral  SpO2: 100% 99% 100% 98%  Weight:      Height:        Intake/Output Summary (Last 24 hours) at 11/11/2022 1252 Last data filed at 11/11/2022 0641 Gross per 24 hour  Intake 1042.07 ml  Output 650 ml  Net 392.07 ml   Filed Weights   11/09/22 1958 11/10/22 0118 11/10/22 1201  Weight: 69.4 kg 68.1 kg 68.1 kg    Examination:   Constitutional: NAD, AAOx3 HEENT: conjunctivae and lids normal, EOMI CV: No cyanosis.   RESP: normal respiratory  effort, on RA Neuro: II - XII grossly intact.   Psych: depressed mood and affect.  Appropriate judgement and reason Rectal tube present   Data Reviewed: I have personally reviewed labs and imaging studies  Time spent: 35 minutes  Enzo Bi, MD [facility] If 7PM-7AM, please contact night-coverage 11/11/2022, 12:52 PM

## 2022-11-11 NOTE — Progress Notes (Signed)
Called the nurse for report, transferred patient to 1C- 125. Patient stable in room air. No distress.

## 2022-11-12 ENCOUNTER — Other Ambulatory Visit (HOSPITAL_COMMUNITY): Payer: Self-pay

## 2022-11-12 DIAGNOSIS — I959 Hypotension, unspecified: Secondary | ICD-10-CM | POA: Diagnosis not present

## 2022-11-12 DIAGNOSIS — R112 Nausea with vomiting, unspecified: Secondary | ICD-10-CM | POA: Diagnosis not present

## 2022-11-12 DIAGNOSIS — Z7401 Bed confinement status: Secondary | ICD-10-CM | POA: Diagnosis not present

## 2022-11-12 DIAGNOSIS — R197 Diarrhea, unspecified: Secondary | ICD-10-CM | POA: Diagnosis not present

## 2022-11-12 LAB — CBC
HCT: 28.1 % — ABNORMAL LOW (ref 36.0–46.0)
Hemoglobin: 8.8 g/dL — ABNORMAL LOW (ref 12.0–15.0)
MCH: 28.6 pg (ref 26.0–34.0)
MCHC: 31.3 g/dL (ref 30.0–36.0)
MCV: 91.2 fL (ref 80.0–100.0)
Platelets: 167 10*3/uL (ref 150–400)
RBC: 3.08 MIL/uL — ABNORMAL LOW (ref 3.87–5.11)
RDW: 15.2 % (ref 11.5–15.5)
WBC: 4.5 10*3/uL (ref 4.0–10.5)
nRBC: 0 % (ref 0.0–0.2)

## 2022-11-12 LAB — BASIC METABOLIC PANEL
Anion gap: 6 (ref 5–15)
BUN: 24 mg/dL — ABNORMAL HIGH (ref 8–23)
CO2: 18 mmol/L — ABNORMAL LOW (ref 22–32)
Calcium: 8 mg/dL — ABNORMAL LOW (ref 8.9–10.3)
Chloride: 112 mmol/L — ABNORMAL HIGH (ref 98–111)
Creatinine, Ser: 1.36 mg/dL — ABNORMAL HIGH (ref 0.44–1.00)
GFR, Estimated: 42 mL/min — ABNORMAL LOW (ref >60)
Glucose, Bld: 95 mg/dL (ref 70–99)
Potassium: 4.1 mmol/L (ref 3.5–5.1)
Sodium: 136 mmol/L (ref 135–145)

## 2022-11-12 LAB — MAGNESIUM: Magnesium: 1.7 mg/dL (ref 1.7–2.4)

## 2022-11-12 MED ORDER — VANCOMYCIN HCL 125 MG PO CAPS: 125.0000 mg | ORAL_CAPSULE | Freq: Four times a day (QID) | ORAL | 0 refills | Status: AC

## 2022-11-12 MED ORDER — METOPROLOL TARTRATE 25 MG PO TABS: ORAL_TABLET | ORAL | 0 refills | Status: DC

## 2022-11-12 NOTE — TOC Transition Note (Signed)
Transition of Care [facility]) - CM/SW Discharge Note   Patient Details  Name: Jeanne [name] MRN: [id number] Date of Birth: November 05, 1950  [facility] of [facility] CM/SW Contact:  Gerilyn Pilgrim, LCSW Phone Number: 11/12/2022, 8:44 AM   Clinical Narrative:   Pt has orders to discharge home with home health PT/OT/RN. [place] notified. Pt would like to discharge home with ambulance transport. [facility] called. Medical necessity printed to unit. Pt's spouse notified.     Final next level of care: [place] Barriers to Discharge: Barriers Resolved   Patient Goals and CMS Choice      Discharge Placement                  Patient to be transferred to facility by: [facility]      Discharge Plan and Services Additional resources added to the After Visit Summary for                                    Representative spoke with at [place]: [place]  Social Determinants of Health (SDOH) Interventions SDOH Screenings   Food Insecurity: No Food Insecurity (11/10/2022)  Housing: Low Risk  (11/10/2022)  Transportation Needs: No Transportation Needs (11/10/2022)  Utilities: Not At Risk (11/10/2022)  Alcohol Screen: Low Risk  (08/10/2020)  Depression (PHQ2-9): Low Risk  (08/10/2020)  Financial Resource Strain: Low Risk  (08/10/2020)  Physical Activity: Inactive (08/10/2020)  Social Connections: Moderately Integrated (08/10/2020)  Stress: No Stress Concern Present (08/10/2020)  Tobacco Use: Medium Risk (11/09/2022)     Readmission Risk Interventions     No data to display

## 2022-11-12 NOTE — Discharge Summary (Signed)
Physician Discharge Summary   Jeanne [name]  female DOB: 08-21-1951  [id number]  PCP: [place] Pink, MD  Admit date: 11/09/2022 Discharge date: 11/12/2022  Admitted From: home Disposition:  home Home Health: Yes CODE STATUS: Full code  Discharge Instructions     Discharge instructions   Complete by: As directed    For your C diff infection, since your diarrhea has resolved, you can go home to recover.  Please finish 7 more days of oral Vanc as directed.  Please hold all your preventive antibiotics until you follow up with primary care doctor.   Dr. Enzo Bi [facility] Course:  For full details, please see H&P, progress notes, consult notes and ancillary notes.  Briefly,  Jeanne [name] is a 72 y.o.  female with medical history significant for recurrent UTI, cervical cancer s/p radiation, chronic pain on chronic opioids, recent hip joint infection on long-term abx who presented with N/V/D.   Diarrhea 2/2 C diff infection --started on oral Vanc, and rectal tube inserted.  Received MIVF.  Rectal tube was removed the day before discharge, and pt's diarrhea had resolved on the day of discharge. --pt was discharged on 7 more days of oral Vanc to finish a 10-day course. --all ppx abx (reportedly for prevention of UTI) d/c'ed, pending PCP followup. --home Lomotil d/c'ed due to active C diff infection. --cont home lactobacillus acidophilus    Severe Sepsis ([place]) --tachycardia, leukocytosis, with C diff infection, with AKI   Acute kidney injury superimposed on chronic kidney disease (HCC) --Cr 1.9 on presentation, improved to 1.53 next day with IVF.   COVID-19 virus infection --no respiratory symptoms.     Pyuria --urine cx pos for yeast.  Currently no dysuria --no need for abx  Arthritis  Chronic pain on chronic opioids --pt showed her recent Rx for oral dilaudid for pain, however, pt had some issue getting prescribed at her pharm. --pt was given oral  dilaudid 2 mg PRN during hospitalization, but was instructed to ask her outpatient prescriber to fix her outpatient opioid Rx. --cont home oral diclofenac  Hx of HTN --BP soft during hospitalization. --Home Lopressor held pending outpatient f/u.   Discharge Diagnoses:  Principal Problem:   Nausea vomiting and diarrhea Active Problems:   UTI (urinary tract infection)   Clostridium difficile diarrhea   COVID-19 virus infection   Acute kidney injury superimposed on chronic kidney disease (HCC)   Anemia   Sepsis ([place])   30 Day Unplanned Readmission Risk Score    Flowsheet Row ED to Hosp-Admission (Current) from 11/09/2022 in [place]  30 Day Unplanned Readmission Risk Score (%) 15.41 Filed at 11/12/2022 0801       This score is the patient's risk of an unplanned readmission within 30 days of being discharged (0 -100%). The score is based on dignosis, age, lab data, medications, orders, and past utilization.   Low:  0-14.9   Medium: 15-21.9   High: 22-29.9   Extreme: 30 and above         Discharge Instructions:  Allergies as of 11/12/2022       Reactions   Codeine Nausea Only, Nausea And Vomiting   Augmentin [amoxicillin-pot Clavulanate] Nausea Only   Very nauseated/ feels sore on body         Medication List     STOP taking these medications    diphenoxylate-atropine 2.5-0.025 MG tablet Commonly known as: LOMOTIL  doxycycline 100 MG capsule Commonly known as: MONODOX   HYDROcodone-acetaminophen 7.5-325 MG tablet Commonly known as: NORCO   PROBIOTIC PO   sulfamethoxazole-trimethoprim 800-160 MG tablet Commonly known as: BACTRIM DS   trimethoprim 100 MG tablet Commonly known as: TRIMPEX       TAKE these medications    diclofenac 75 MG EC tablet Commonly known as: VOLTAREN Take 1 tablet (75 mg total) by mouth 2 (two) times daily.   gabapentin 100 MG capsule Commonly known as: NEURONTIN Take 100 mg by  mouth at bedtime.   lactobacillus acidophilus Tabs tablet Take 1 tablet by mouth daily.   lidocaine-prilocaine cream Commonly known as: EMLA APPLY SPARINGLY 2-3 TIMES A DAY TO AREA OF RADIATION THERAPY IRRITATION.   metoprolol tartrate 25 MG tablet Commonly known as: LOPRESSOR Hold until outpatient followup due to low blood pressure. What changed:  how much to take how to take this when to take this additional instructions   ondansetron 8 MG disintegrating tablet Commonly known as: ZOFRAN-ODT Take 1 tablet (8 mg total) by mouth every 8 (eight) hours as needed for nausea or vomiting.   oxyCODONE 5 MG immediate release tablet Commonly known as: Oxy IR/ROXICODONE Take 5-10 mg by mouth every 4 (four) hours as needed.   promethazine 25 MG tablet Commonly known as: PHENERGAN TAKE 0.5 TABLETS (12.5 MG TOTAL) BY MOUTH EVERY 6 (SIX) HOURS AS NEEDED FOR NAUSEA OR VOMITING.   tretinoin 0.025 % cream Commonly known as: RETIN-A Apply topically at bedtime.   vancomycin 125 MG capsule Commonly known as: VANCOCIN Take 1 capsule (125 mg total) by mouth 4 (four) times daily for 7 days.         Follow-up Information     [place] Pink, MD Follow up in 1 week(s).   Specialty: Family Medicine Contact information: [address] [place] [address] [phone]                 Allergies  Allergen Reactions   Codeine Nausea Only and Nausea And Vomiting   Augmentin [Amoxicillin-Pot Clavulanate] Nausea Only    Very nauseated/ feels sore on body      The results of significant diagnostics from this hospitalization (including imaging, microbiology, ancillary and laboratory) are listed below for reference.   Consultations:   Procedures/Studies: No results found.    Labs: BNP (last 3 results) No results for input(s): "BNP" in the last 8760 hours. Basic Metabolic Panel: Recent Labs  Lab 11/09/22 2002 11/10/22 0426 11/11/22 0547  11/12/22 0442  NA 139 137 133* 136  K 5.0 4.5 4.4 4.1  CL 111 116* 111 112*  CO2 18* 15* 16* 18*  GLUCOSE 126* 117* 107* 95  BUN 40* 36* 27* 24*  CREATININE 1.90* 1.53* 1.35* 1.36*  CALCIUM 8.5* 8.0* 8.0* 8.0*  MG 2.0 1.7 1.7 1.7   Liver Function Tests: Recent Labs  Lab 11/09/22 2002 11/10/22 0426  AST 26 21  ALT 19 16  ALKPHOS 140* 118  BILITOT 0.6 0.6  PROT 6.7 5.4*  ALBUMIN 2.7* 2.2*   Recent Labs  Lab 11/09/22 2002  LIPASE 32   No results for input(s): "AMMONIA" in the last 168 hours. CBC: Recent Labs  Lab 11/09/22 2002 11/10/22 0426 11/11/22 0547 11/12/22 0442  WBC 11.7* 9.9 5.4 4.5  NEUTROABS 10.2*  --   --   --   HGB 11.7* 9.5* 9.8* 8.8*  HCT 37.8 31.1* 30.8* 28.1*  MCV 91.7 93.1 90.9 91.2  PLT  274 215 159 167   Cardiac Enzymes: No results for input(s): "CKTOTAL", "CKMB", "CKMBINDEX", "TROPONINI" in the last 168 hours. BNP: Invalid input(s): "POCBNP" CBG: No results for input(s): "GLUCAP" in the last 168 hours. D-Dimer No results for input(s): "DDIMER" in the last 72 hours. Hgb A1c No results for input(s): "HGBA1C" in the last 72 hours. Lipid Profile No results for input(s): "CHOL", "HDL", "LDLCALC", "TRIG", "CHOLHDL", "LDLDIRECT" in the last 72 hours. Thyroid function studies No results for input(s): "TSH", "T4TOTAL", "T3FREE", "THYROIDAB" in the last 72 hours.  Invalid input(s): "FREET3" Anemia work up No results for input(s): "VITAMINB12", "FOLATE", "FERRITIN", "TIBC", "IRON", "RETICCTPCT" in the last 72 hours. Urinalysis    Component Value Date/Time   COLORURINE YELLOW (A) 11/09/2022 2002   APPEARANCEUR CLOUDY (A) 11/09/2022 2002   APPEARANCEUR Cloudy (A) 10/21/2018 0841   LABSPEC 1.015 11/09/2022 2002   PHURINE 5.0 11/09/2022 2002   GLUCOSEU NEGATIVE 11/09/2022 2002   HGBUR SMALL (A) 11/09/2022 2002   BILIRUBINUR NEGATIVE 11/09/2022 2002   BILIRUBINUR negative 07/15/2020 0827   BILIRUBINUR Negative 10/21/2018 [place] 11/09/2022 2002   PROTEINUR NEGATIVE 11/09/2022 2002   UROBILINOGEN 0.2 07/15/2020 0827   NITRITE NEGATIVE 11/09/2022 2002   LEUKOCYTESUR LARGE (A) 11/09/2022 2002   Sepsis Labs Recent Labs  Lab 11/09/22 2002 11/10/22 0426 11/11/22 0547 11/12/22 0442  WBC 11.7* 9.9 5.4 4.5   Microbiology Recent Results (from the past 240 hour(s))  Urine Culture (for pregnant, neutropenic or urologic patients or patients with an indwelling urinary catheter)     Status: Abnormal   Collection Time: 11/09/22  8:02 PM   Specimen: Urine, Clean Catch  Result Value Ref Range Status   Specimen Description   Final    URINE, CLEAN CATCH Performed at [facility], [address], [place], [place] [address]    Special Requests   Final    NONE Performed at [facility], [address] [place], [place] [address]    Culture >=100,000 COLONIES/mL YEAST (A)  Final   Report Status 11/11/2022 FINAL  Final  C Difficile Quick Screen w PCR reflex     Status: Abnormal   Collection Time: 11/09/22  9:03 PM   Specimen: Urine, Clean Catch; Stool  Result Value Ref Range Status   C Diff antigen POSITIVE (A) NEGATIVE Final   C Diff toxin POSITIVE (A) NEGATIVE Final   C Diff interpretation Toxin producing C. difficile detected.  Final    Comment: CRITICAL RESULT CALLED TO, READ BACK BY AND VERIFIED WITH: Cathie Olden RN ED @ 2208 11/09/22 LFD Performed at [facility], [address] [place], [place] [address]   Gastrointestinal Panel by PCR , Stool     Status: None   Collection Time: 11/09/22  9:03 PM   Specimen: Urine, Clean Catch; Stool  Result Value Ref Range Status   Campylobacter species NOT DETECTED NOT DETECTED Final   Plesimonas shigelloides NOT DETECTED NOT DETECTED Final   Salmonella species NOT DETECTED NOT DETECTED Final   Yersinia enterocolitica NOT DETECTED NOT DETECTED Final   Vibrio species NOT DETECTED NOT DETECTED Final   Vibrio cholerae NOT DETECTED NOT  DETECTED Final   Enteroaggregative E coli (EAEC) NOT DETECTED NOT DETECTED Final   Enteropathogenic E coli (EPEC) NOT DETECTED NOT DETECTED Final   Enterotoxigenic E coli (ETEC) NOT DETECTED NOT DETECTED Final   Shiga like toxin producing E coli (STEC) NOT DETECTED NOT DETECTED Final   Shigella/Enteroinvasive E coli (EIEC)  NOT DETECTED NOT DETECTED Final   Cryptosporidium NOT DETECTED NOT DETECTED Final   Cyclospora cayetanensis NOT DETECTED NOT DETECTED Final   Entamoeba histolytica NOT DETECTED NOT DETECTED Final   Giardia lamblia NOT DETECTED NOT DETECTED Final   Adenovirus F40/41 NOT DETECTED NOT DETECTED Final   Astrovirus NOT DETECTED NOT DETECTED Final   Norovirus GI/GII NOT DETECTED NOT DETECTED Final   Rotavirus A NOT DETECTED NOT DETECTED Final   Sapovirus (I, II, IV, and V) NOT DETECTED NOT DETECTED Final    Comment: Performed at [facility], [address] [place], [place] [address]  Resp panel by RT-PCR (RSV, Flu A&B, Covid) Urine, Clean Catch     Status: Abnormal   Collection Time: 11/09/22  9:04 PM   Specimen: Urine, Clean Catch; Nasal Swab  Result Value Ref Range Status   SARS Coronavirus 2 by RT PCR POSITIVE (A) NEGATIVE Final    Comment: (NOTE) SARS-CoV-2 target nucleic acids are DETECTED.  The SARS-CoV-2 RNA is generally detectable in upper respiratory specimens during the acute phase of infection. Positive results are indicative of the presence of the identified virus, but do not rule out bacterial infection or co-infection with other pathogens not detected by the test. Clinical correlation with patient history and other diagnostic information is necessary to determine patient infection status. The expected result is Negative.  Fact Sheet for Patients: EntrepreneurPulse.com.au  Fact Sheet for Healthcare Providers: IncredibleEmployment.be  This test is not yet approved or cleared by the [place] FDA and  has  been authorized for detection and/or diagnosis of SARS-CoV-2 by FDA under an Emergency Use Authorization (EUA).  This EUA will remain in effect (meaning this test can be used) for the duration of  the COVID-19 declaration under Section 564(b)(1) of the A ct, 21 U.S.C. section 360bbb-3(b)(1), unless the authorization is terminated or revoked sooner.     Influenza A by PCR NEGATIVE NEGATIVE Final   Influenza B by PCR NEGATIVE NEGATIVE Final    Comment: (NOTE) The Xpert Xpress SARS-CoV-2/FLU/RSV plus assay is intended as an aid in the diagnosis of influenza from Nasopharyngeal swab specimens and should not be used as a sole basis for treatment. Nasal washings and aspirates are unacceptable for Xpert Xpress SARS-CoV-2/FLU/RSV testing.  Fact Sheet for Patients: EntrepreneurPulse.com.au  Fact Sheet for Healthcare Providers: IncredibleEmployment.be  This test is not yet approved or cleared by the [place] FDA and has been authorized for detection and/or diagnosis of SARS-CoV-2 by FDA under an Emergency Use Authorization (EUA). This EUA will remain in effect (meaning this test can be used) for the duration of the COVID-19 declaration under Section 564(b)(1) of the Act, 21 U.S.C. section 360bbb-3(b)(1), unless the authorization is terminated or revoked.     Resp Syncytial Virus by PCR NEGATIVE NEGATIVE Final    Comment: (NOTE) Fact Sheet for Patients: EntrepreneurPulse.com.au  Fact Sheet for Healthcare Providers: IncredibleEmployment.be  This test is not yet approved or cleared by the [place] FDA and has been authorized for detection and/or diagnosis of SARS-CoV-2 by FDA under an Emergency Use Authorization (EUA). This EUA will remain in effect (meaning this test can be used) for the duration of the COVID-19 declaration under Section 564(b)(1) of the Act, 21 U.S.C. section 360bbb-3(b)(1), unless the  authorization is terminated or revoked.  Performed at [facility], [address] [place],  [address]   Culture, blood (Routine X 2) w Reflex to ID Panel     Status: None (Preliminary result)  Collection Time: 11/10/22 12:26 AM   Specimen: BLOOD  Result Value Ref Range Status   Specimen Description BLOOD HAND  Final   Special Requests   Final    BOTTLES DRAWN AEROBIC AND ANAEROBIC Blood Culture results may not be optimal due to an inadequate volume of blood received in culture bottles   Culture   Final    NO GROWTH 2 DAYS Performed at [facility], [address], [place], [place] [address]    Report Status PENDING  Incomplete  Culture, blood (Routine X 2) w Reflex to ID Panel     Status: None (Preliminary result)   Collection Time: 11/10/22  1:50 AM   Specimen: BLOOD  Result Value Ref Range Status   Specimen Description BLOOD LEFT ANTECUBITAL  Final   Special Requests   Final    BOTTLES DRAWN AEROBIC ONLY Blood Culture results may not be optimal due to an inadequate volume of blood received in culture bottles   Culture   Final    NO GROWTH 2 DAYS Performed at [facility], [address], [place], [place] [address]    Report Status PENDING  Incomplete     Total time spend on discharging this patient, including the last patient exam, discussing the hospital stay, instructions for ongoing care as it relates to all pertinent caregivers, as well as preparing the medical discharge records, prescriptions, and/or referrals as applicable, is 35 minutes.    Enzo Bi, MD  [facility] 11/12/2022, 8:40 AM

## 2022-11-13 DIAGNOSIS — T8451XA Infection and inflammatory reaction due to internal right hip prosthesis, initial encounter: Secondary | ICD-10-CM | POA: Diagnosis not present

## 2022-11-13 DIAGNOSIS — N3946 Mixed incontinence: Secondary | ICD-10-CM | POA: Diagnosis not present

## 2022-11-13 DIAGNOSIS — N184 Chronic kidney disease, stage 4 (severe): Secondary | ICD-10-CM | POA: Diagnosis not present

## 2022-11-13 DIAGNOSIS — M25572 Pain in left ankle and joints of left foot: Secondary | ICD-10-CM | POA: Diagnosis not present

## 2022-11-13 DIAGNOSIS — M25571 Pain in right ankle and joints of right foot: Secondary | ICD-10-CM | POA: Diagnosis not present

## 2022-11-13 DIAGNOSIS — H25812 Combined forms of age-related cataract, left eye: Secondary | ICD-10-CM | POA: Diagnosis not present

## 2022-11-13 DIAGNOSIS — D62 Acute posthemorrhagic anemia: Secondary | ICD-10-CM | POA: Diagnosis not present

## 2022-11-13 DIAGNOSIS — T84030D Mechanical loosening of internal right hip prosthetic joint, subsequent encounter: Secondary | ICD-10-CM | POA: Diagnosis not present

## 2022-11-13 DIAGNOSIS — Z96651 Presence of right artificial knee joint: Secondary | ICD-10-CM | POA: Diagnosis not present

## 2022-11-13 DIAGNOSIS — Z96642 Presence of left artificial hip joint: Secondary | ICD-10-CM | POA: Diagnosis not present

## 2022-11-13 DIAGNOSIS — M545 Low back pain, unspecified: Secondary | ICD-10-CM | POA: Diagnosis not present

## 2022-11-13 DIAGNOSIS — M0800 Unspecified juvenile rheumatoid arthritis of unspecified site: Secondary | ICD-10-CM | POA: Diagnosis not present

## 2022-11-13 DIAGNOSIS — Z792 Long term (current) use of antibiotics: Secondary | ICD-10-CM | POA: Diagnosis not present

## 2022-11-13 DIAGNOSIS — Z9181 History of falling: Secondary | ICD-10-CM | POA: Diagnosis not present

## 2022-11-13 DIAGNOSIS — Z87442 Personal history of urinary calculi: Secondary | ICD-10-CM | POA: Diagnosis not present

## 2022-11-13 DIAGNOSIS — Z87440 Personal history of urinary (tract) infections: Secondary | ICD-10-CM | POA: Diagnosis not present

## 2022-11-13 DIAGNOSIS — Z87891 Personal history of nicotine dependence: Secondary | ICD-10-CM | POA: Diagnosis not present

## 2022-11-13 DIAGNOSIS — Z8541 Personal history of malignant neoplasm of cervix uteri: Secondary | ICD-10-CM | POA: Diagnosis not present

## 2022-11-13 DIAGNOSIS — M25562 Pain in left knee: Secondary | ICD-10-CM | POA: Diagnosis not present

## 2022-11-13 DIAGNOSIS — I129 Hypertensive chronic kidney disease with stage 1 through stage 4 chronic kidney disease, or unspecified chronic kidney disease: Secondary | ICD-10-CM | POA: Diagnosis not present

## 2022-11-13 DIAGNOSIS — Z7982 Long term (current) use of aspirin: Secondary | ICD-10-CM | POA: Diagnosis not present

## 2022-11-13 LAB — BPAM RBC
Blood Product Expiration Date: 202404012359
Blood Product Expiration Date: 202404022359
Unit Type and Rh: 5100
Unit Type and Rh: 9500

## 2022-11-13 LAB — TYPE AND SCREEN
ABO/RH(D): O POS
Antibody Screen: POSITIVE
Unit division: 0
Unit division: 0

## 2022-11-15 LAB — CULTURE, BLOOD (ROUTINE X 2)
Culture: NO GROWTH
Culture: NO GROWTH

## 2022-11-16 DIAGNOSIS — Z96649 Presence of unspecified artificial hip joint: Secondary | ICD-10-CM | POA: Diagnosis not present

## 2022-11-16 DIAGNOSIS — T84038A Mechanical loosening of other internal prosthetic joint, initial encounter: Secondary | ICD-10-CM | POA: Diagnosis not present

## 2022-11-20 DIAGNOSIS — Z96651 Presence of right artificial knee joint: Secondary | ICD-10-CM | POA: Diagnosis not present

## 2022-11-20 DIAGNOSIS — A0472 Enterocolitis due to Clostridium difficile, not specified as recurrent: Secondary | ICD-10-CM | POA: Diagnosis not present

## 2022-11-20 DIAGNOSIS — I129 Hypertensive chronic kidney disease with stage 1 through stage 4 chronic kidney disease, or unspecified chronic kidney disease: Secondary | ICD-10-CM | POA: Diagnosis not present

## 2022-11-20 DIAGNOSIS — G629 Polyneuropathy, unspecified: Secondary | ICD-10-CM | POA: Diagnosis not present

## 2022-11-20 DIAGNOSIS — N179 Acute kidney failure, unspecified: Secondary | ICD-10-CM | POA: Diagnosis not present

## 2022-11-20 DIAGNOSIS — N390 Urinary tract infection, site not specified: Secondary | ICD-10-CM | POA: Diagnosis not present

## 2022-11-20 DIAGNOSIS — Z96642 Presence of left artificial hip joint: Secondary | ICD-10-CM | POA: Diagnosis not present

## 2022-11-20 DIAGNOSIS — A419 Sepsis, unspecified organism: Secondary | ICD-10-CM | POA: Diagnosis not present

## 2022-11-20 DIAGNOSIS — Z8541 Personal history of malignant neoplasm of cervix uteri: Secondary | ICD-10-CM | POA: Diagnosis not present

## 2022-11-20 DIAGNOSIS — Z993 Dependence on wheelchair: Secondary | ICD-10-CM | POA: Diagnosis not present

## 2022-11-20 DIAGNOSIS — N184 Chronic kidney disease, stage 4 (severe): Secondary | ICD-10-CM | POA: Diagnosis not present

## 2022-11-20 DIAGNOSIS — T84030D Mechanical loosening of internal right hip prosthetic joint, subsequent encounter: Secondary | ICD-10-CM | POA: Diagnosis not present

## 2022-11-20 DIAGNOSIS — Z87891 Personal history of nicotine dependence: Secondary | ICD-10-CM | POA: Diagnosis not present

## 2022-11-20 DIAGNOSIS — Z87442 Personal history of urinary calculi: Secondary | ICD-10-CM | POA: Diagnosis not present

## 2022-11-20 DIAGNOSIS — Z791 Long term (current) use of non-steroidal anti-inflammatories (NSAID): Secondary | ICD-10-CM | POA: Diagnosis not present

## 2022-11-20 DIAGNOSIS — U071 COVID-19: Secondary | ICD-10-CM | POA: Diagnosis not present

## 2022-11-20 DIAGNOSIS — T8451XA Infection and inflammatory reaction due to internal right hip prosthesis, initial encounter: Secondary | ICD-10-CM | POA: Diagnosis not present

## 2022-11-20 DIAGNOSIS — D631 Anemia in chronic kidney disease: Secondary | ICD-10-CM | POA: Diagnosis not present

## 2022-11-20 DIAGNOSIS — Z85828 Personal history of other malignant neoplasm of skin: Secondary | ICD-10-CM | POA: Diagnosis not present

## 2022-11-20 DIAGNOSIS — Z96649 Presence of unspecified artificial hip joint: Secondary | ICD-10-CM | POA: Diagnosis not present

## 2022-11-20 DIAGNOSIS — M0800 Unspecified juvenile rheumatoid arthritis of unspecified site: Secondary | ICD-10-CM | POA: Diagnosis not present

## 2022-11-21 DIAGNOSIS — R300 Dysuria: Secondary | ICD-10-CM | POA: Diagnosis not present

## 2022-11-27 DIAGNOSIS — G629 Polyneuropathy, unspecified: Secondary | ICD-10-CM | POA: Diagnosis not present

## 2022-11-27 DIAGNOSIS — Z96651 Presence of right artificial knee joint: Secondary | ICD-10-CM | POA: Diagnosis not present

## 2022-11-27 DIAGNOSIS — U071 COVID-19: Secondary | ICD-10-CM | POA: Diagnosis not present

## 2022-11-27 DIAGNOSIS — M0800 Unspecified juvenile rheumatoid arthritis of unspecified site: Secondary | ICD-10-CM | POA: Diagnosis not present

## 2022-11-27 DIAGNOSIS — Z87442 Personal history of urinary calculi: Secondary | ICD-10-CM | POA: Diagnosis not present

## 2022-11-27 DIAGNOSIS — Z96642 Presence of left artificial hip joint: Secondary | ICD-10-CM | POA: Diagnosis not present

## 2022-11-27 DIAGNOSIS — A419 Sepsis, unspecified organism: Secondary | ICD-10-CM | POA: Diagnosis not present

## 2022-11-27 DIAGNOSIS — N179 Acute kidney failure, unspecified: Secondary | ICD-10-CM | POA: Diagnosis not present

## 2022-11-27 DIAGNOSIS — T84030D Mechanical loosening of internal right hip prosthetic joint, subsequent encounter: Secondary | ICD-10-CM | POA: Diagnosis not present

## 2022-11-27 DIAGNOSIS — Z87891 Personal history of nicotine dependence: Secondary | ICD-10-CM | POA: Diagnosis not present

## 2022-11-27 DIAGNOSIS — N390 Urinary tract infection, site not specified: Secondary | ICD-10-CM | POA: Diagnosis not present

## 2022-11-27 DIAGNOSIS — N184 Chronic kidney disease, stage 4 (severe): Secondary | ICD-10-CM | POA: Diagnosis not present

## 2022-11-27 DIAGNOSIS — Z791 Long term (current) use of non-steroidal anti-inflammatories (NSAID): Secondary | ICD-10-CM | POA: Diagnosis not present

## 2022-11-27 DIAGNOSIS — T8451XA Infection and inflammatory reaction due to internal right hip prosthesis, initial encounter: Secondary | ICD-10-CM | POA: Diagnosis not present

## 2022-11-27 DIAGNOSIS — Z8541 Personal history of malignant neoplasm of cervix uteri: Secondary | ICD-10-CM | POA: Diagnosis not present

## 2022-11-27 DIAGNOSIS — D631 Anemia in chronic kidney disease: Secondary | ICD-10-CM | POA: Diagnosis not present

## 2022-11-27 DIAGNOSIS — A0472 Enterocolitis due to Clostridium difficile, not specified as recurrent: Secondary | ICD-10-CM | POA: Diagnosis not present

## 2022-11-27 DIAGNOSIS — Z993 Dependence on wheelchair: Secondary | ICD-10-CM | POA: Diagnosis not present

## 2022-11-27 DIAGNOSIS — Z85828 Personal history of other malignant neoplasm of skin: Secondary | ICD-10-CM | POA: Diagnosis not present

## 2022-11-27 DIAGNOSIS — I129 Hypertensive chronic kidney disease with stage 1 through stage 4 chronic kidney disease, or unspecified chronic kidney disease: Secondary | ICD-10-CM | POA: Diagnosis not present

## 2022-11-29 DIAGNOSIS — D631 Anemia in chronic kidney disease: Secondary | ICD-10-CM | POA: Diagnosis not present

## 2022-11-29 DIAGNOSIS — G629 Polyneuropathy, unspecified: Secondary | ICD-10-CM | POA: Diagnosis not present

## 2022-11-29 DIAGNOSIS — T8451XA Infection and inflammatory reaction due to internal right hip prosthesis, initial encounter: Secondary | ICD-10-CM | POA: Diagnosis not present

## 2022-11-29 DIAGNOSIS — M0800 Unspecified juvenile rheumatoid arthritis of unspecified site: Secondary | ICD-10-CM | POA: Diagnosis not present

## 2022-11-29 DIAGNOSIS — A0472 Enterocolitis due to Clostridium difficile, not specified as recurrent: Secondary | ICD-10-CM | POA: Diagnosis not present

## 2022-11-29 DIAGNOSIS — U071 COVID-19: Secondary | ICD-10-CM | POA: Diagnosis not present

## 2022-11-29 DIAGNOSIS — Z96651 Presence of right artificial knee joint: Secondary | ICD-10-CM | POA: Diagnosis not present

## 2022-11-29 DIAGNOSIS — Z993 Dependence on wheelchair: Secondary | ICD-10-CM | POA: Diagnosis not present

## 2022-11-29 DIAGNOSIS — Z8541 Personal history of malignant neoplasm of cervix uteri: Secondary | ICD-10-CM | POA: Diagnosis not present

## 2022-11-29 DIAGNOSIS — Z87442 Personal history of urinary calculi: Secondary | ICD-10-CM | POA: Diagnosis not present

## 2022-11-29 DIAGNOSIS — Z85828 Personal history of other malignant neoplasm of skin: Secondary | ICD-10-CM | POA: Diagnosis not present

## 2022-11-29 DIAGNOSIS — N179 Acute kidney failure, unspecified: Secondary | ICD-10-CM | POA: Diagnosis not present

## 2022-11-29 DIAGNOSIS — Z791 Long term (current) use of non-steroidal anti-inflammatories (NSAID): Secondary | ICD-10-CM | POA: Diagnosis not present

## 2022-11-29 DIAGNOSIS — N184 Chronic kidney disease, stage 4 (severe): Secondary | ICD-10-CM | POA: Diagnosis not present

## 2022-11-29 DIAGNOSIS — A419 Sepsis, unspecified organism: Secondary | ICD-10-CM | POA: Diagnosis not present

## 2022-11-29 DIAGNOSIS — T84030D Mechanical loosening of internal right hip prosthetic joint, subsequent encounter: Secondary | ICD-10-CM | POA: Diagnosis not present

## 2022-11-29 DIAGNOSIS — N390 Urinary tract infection, site not specified: Secondary | ICD-10-CM | POA: Diagnosis not present

## 2022-11-29 DIAGNOSIS — Z96642 Presence of left artificial hip joint: Secondary | ICD-10-CM | POA: Diagnosis not present

## 2022-11-29 DIAGNOSIS — Z87891 Personal history of nicotine dependence: Secondary | ICD-10-CM | POA: Diagnosis not present

## 2022-11-29 DIAGNOSIS — I129 Hypertensive chronic kidney disease with stage 1 through stage 4 chronic kidney disease, or unspecified chronic kidney disease: Secondary | ICD-10-CM | POA: Diagnosis not present

## 2022-12-03 DIAGNOSIS — A0472 Enterocolitis due to Clostridium difficile, not specified as recurrent: Secondary | ICD-10-CM | POA: Diagnosis not present

## 2022-12-03 DIAGNOSIS — N179 Acute kidney failure, unspecified: Secondary | ICD-10-CM | POA: Diagnosis not present

## 2022-12-03 DIAGNOSIS — N390 Urinary tract infection, site not specified: Secondary | ICD-10-CM | POA: Diagnosis not present

## 2022-12-03 DIAGNOSIS — Z791 Long term (current) use of non-steroidal anti-inflammatories (NSAID): Secondary | ICD-10-CM | POA: Diagnosis not present

## 2022-12-03 DIAGNOSIS — T84030D Mechanical loosening of internal right hip prosthetic joint, subsequent encounter: Secondary | ICD-10-CM | POA: Diagnosis not present

## 2022-12-03 DIAGNOSIS — Z85828 Personal history of other malignant neoplasm of skin: Secondary | ICD-10-CM | POA: Diagnosis not present

## 2022-12-03 DIAGNOSIS — Z96642 Presence of left artificial hip joint: Secondary | ICD-10-CM | POA: Diagnosis not present

## 2022-12-03 DIAGNOSIS — G629 Polyneuropathy, unspecified: Secondary | ICD-10-CM | POA: Diagnosis not present

## 2022-12-03 DIAGNOSIS — I129 Hypertensive chronic kidney disease with stage 1 through stage 4 chronic kidney disease, or unspecified chronic kidney disease: Secondary | ICD-10-CM | POA: Diagnosis not present

## 2022-12-03 DIAGNOSIS — N184 Chronic kidney disease, stage 4 (severe): Secondary | ICD-10-CM | POA: Diagnosis not present

## 2022-12-03 DIAGNOSIS — Z87442 Personal history of urinary calculi: Secondary | ICD-10-CM | POA: Diagnosis not present

## 2022-12-03 DIAGNOSIS — A419 Sepsis, unspecified organism: Secondary | ICD-10-CM | POA: Diagnosis not present

## 2022-12-03 DIAGNOSIS — Z8541 Personal history of malignant neoplasm of cervix uteri: Secondary | ICD-10-CM | POA: Diagnosis not present

## 2022-12-03 DIAGNOSIS — Z96651 Presence of right artificial knee joint: Secondary | ICD-10-CM | POA: Diagnosis not present

## 2022-12-03 DIAGNOSIS — M0800 Unspecified juvenile rheumatoid arthritis of unspecified site: Secondary | ICD-10-CM | POA: Diagnosis not present

## 2022-12-03 DIAGNOSIS — U071 COVID-19: Secondary | ICD-10-CM | POA: Diagnosis not present

## 2022-12-03 DIAGNOSIS — T8451XA Infection and inflammatory reaction due to internal right hip prosthesis, initial encounter: Secondary | ICD-10-CM | POA: Diagnosis not present

## 2022-12-03 DIAGNOSIS — D631 Anemia in chronic kidney disease: Secondary | ICD-10-CM | POA: Diagnosis not present

## 2022-12-03 DIAGNOSIS — Z87891 Personal history of nicotine dependence: Secondary | ICD-10-CM | POA: Diagnosis not present

## 2022-12-03 DIAGNOSIS — Z993 Dependence on wheelchair: Secondary | ICD-10-CM | POA: Diagnosis not present

## 2022-12-11 DIAGNOSIS — Z993 Dependence on wheelchair: Secondary | ICD-10-CM | POA: Diagnosis not present

## 2022-12-11 DIAGNOSIS — Z87442 Personal history of urinary calculi: Secondary | ICD-10-CM | POA: Diagnosis not present

## 2022-12-11 DIAGNOSIS — D631 Anemia in chronic kidney disease: Secondary | ICD-10-CM | POA: Diagnosis not present

## 2022-12-11 DIAGNOSIS — Z8541 Personal history of malignant neoplasm of cervix uteri: Secondary | ICD-10-CM | POA: Diagnosis not present

## 2022-12-11 DIAGNOSIS — Z96642 Presence of left artificial hip joint: Secondary | ICD-10-CM | POA: Diagnosis not present

## 2022-12-11 DIAGNOSIS — Z96651 Presence of right artificial knee joint: Secondary | ICD-10-CM | POA: Diagnosis not present

## 2022-12-11 DIAGNOSIS — A419 Sepsis, unspecified organism: Secondary | ICD-10-CM | POA: Diagnosis not present

## 2022-12-11 DIAGNOSIS — N179 Acute kidney failure, unspecified: Secondary | ICD-10-CM | POA: Diagnosis not present

## 2022-12-11 DIAGNOSIS — Z85828 Personal history of other malignant neoplasm of skin: Secondary | ICD-10-CM | POA: Diagnosis not present

## 2022-12-11 DIAGNOSIS — U071 COVID-19: Secondary | ICD-10-CM | POA: Diagnosis not present

## 2022-12-11 DIAGNOSIS — N184 Chronic kidney disease, stage 4 (severe): Secondary | ICD-10-CM | POA: Diagnosis not present

## 2022-12-11 DIAGNOSIS — I129 Hypertensive chronic kidney disease with stage 1 through stage 4 chronic kidney disease, or unspecified chronic kidney disease: Secondary | ICD-10-CM | POA: Diagnosis not present

## 2022-12-11 DIAGNOSIS — N390 Urinary tract infection, site not specified: Secondary | ICD-10-CM | POA: Diagnosis not present

## 2022-12-11 DIAGNOSIS — Z791 Long term (current) use of non-steroidal anti-inflammatories (NSAID): Secondary | ICD-10-CM | POA: Diagnosis not present

## 2022-12-11 DIAGNOSIS — T84030D Mechanical loosening of internal right hip prosthetic joint, subsequent encounter: Secondary | ICD-10-CM | POA: Diagnosis not present

## 2022-12-11 DIAGNOSIS — T8451XA Infection and inflammatory reaction due to internal right hip prosthesis, initial encounter: Secondary | ICD-10-CM | POA: Diagnosis not present

## 2022-12-11 DIAGNOSIS — Z87891 Personal history of nicotine dependence: Secondary | ICD-10-CM | POA: Diagnosis not present

## 2022-12-11 DIAGNOSIS — G629 Polyneuropathy, unspecified: Secondary | ICD-10-CM | POA: Diagnosis not present

## 2022-12-11 DIAGNOSIS — M0800 Unspecified juvenile rheumatoid arthritis of unspecified site: Secondary | ICD-10-CM | POA: Diagnosis not present

## 2022-12-11 DIAGNOSIS — A0472 Enterocolitis due to Clostridium difficile, not specified as recurrent: Secondary | ICD-10-CM | POA: Diagnosis not present

## 2022-12-14 DIAGNOSIS — Z87891 Personal history of nicotine dependence: Secondary | ICD-10-CM | POA: Diagnosis not present

## 2022-12-14 DIAGNOSIS — Z96642 Presence of left artificial hip joint: Secondary | ICD-10-CM | POA: Diagnosis not present

## 2022-12-14 DIAGNOSIS — Z791 Long term (current) use of non-steroidal anti-inflammatories (NSAID): Secondary | ICD-10-CM | POA: Diagnosis not present

## 2022-12-14 DIAGNOSIS — A419 Sepsis, unspecified organism: Secondary | ICD-10-CM | POA: Diagnosis not present

## 2022-12-14 DIAGNOSIS — N179 Acute kidney failure, unspecified: Secondary | ICD-10-CM | POA: Diagnosis not present

## 2022-12-14 DIAGNOSIS — Z85828 Personal history of other malignant neoplasm of skin: Secondary | ICD-10-CM | POA: Diagnosis not present

## 2022-12-14 DIAGNOSIS — I129 Hypertensive chronic kidney disease with stage 1 through stage 4 chronic kidney disease, or unspecified chronic kidney disease: Secondary | ICD-10-CM | POA: Diagnosis not present

## 2022-12-14 DIAGNOSIS — T8451XA Infection and inflammatory reaction due to internal right hip prosthesis, initial encounter: Secondary | ICD-10-CM | POA: Diagnosis not present

## 2022-12-14 DIAGNOSIS — Z8541 Personal history of malignant neoplasm of cervix uteri: Secondary | ICD-10-CM | POA: Diagnosis not present

## 2022-12-14 DIAGNOSIS — Z87442 Personal history of urinary calculi: Secondary | ICD-10-CM | POA: Diagnosis not present

## 2022-12-14 DIAGNOSIS — M0800 Unspecified juvenile rheumatoid arthritis of unspecified site: Secondary | ICD-10-CM | POA: Diagnosis not present

## 2022-12-14 DIAGNOSIS — D631 Anemia in chronic kidney disease: Secondary | ICD-10-CM | POA: Diagnosis not present

## 2022-12-14 DIAGNOSIS — N184 Chronic kidney disease, stage 4 (severe): Secondary | ICD-10-CM | POA: Diagnosis not present

## 2022-12-14 DIAGNOSIS — T84030D Mechanical loosening of internal right hip prosthetic joint, subsequent encounter: Secondary | ICD-10-CM | POA: Diagnosis not present

## 2022-12-14 DIAGNOSIS — U071 COVID-19: Secondary | ICD-10-CM | POA: Diagnosis not present

## 2022-12-14 DIAGNOSIS — N390 Urinary tract infection, site not specified: Secondary | ICD-10-CM | POA: Diagnosis not present

## 2022-12-14 DIAGNOSIS — Z96651 Presence of right artificial knee joint: Secondary | ICD-10-CM | POA: Diagnosis not present

## 2022-12-14 DIAGNOSIS — A0472 Enterocolitis due to Clostridium difficile, not specified as recurrent: Secondary | ICD-10-CM | POA: Diagnosis not present

## 2022-12-14 DIAGNOSIS — Z993 Dependence on wheelchair: Secondary | ICD-10-CM | POA: Diagnosis not present

## 2022-12-14 DIAGNOSIS — G629 Polyneuropathy, unspecified: Secondary | ICD-10-CM | POA: Diagnosis not present

## 2022-12-17 DIAGNOSIS — Z96649 Presence of unspecified artificial hip joint: Secondary | ICD-10-CM | POA: Diagnosis not present

## 2022-12-17 DIAGNOSIS — T84038A Mechanical loosening of other internal prosthetic joint, initial encounter: Secondary | ICD-10-CM | POA: Diagnosis not present

## 2022-12-18 DIAGNOSIS — I129 Hypertensive chronic kidney disease with stage 1 through stage 4 chronic kidney disease, or unspecified chronic kidney disease: Secondary | ICD-10-CM | POA: Diagnosis not present

## 2022-12-18 DIAGNOSIS — Z96642 Presence of left artificial hip joint: Secondary | ICD-10-CM | POA: Diagnosis not present

## 2022-12-18 DIAGNOSIS — N184 Chronic kidney disease, stage 4 (severe): Secondary | ICD-10-CM | POA: Diagnosis not present

## 2022-12-18 DIAGNOSIS — Z993 Dependence on wheelchair: Secondary | ICD-10-CM | POA: Diagnosis not present

## 2022-12-18 DIAGNOSIS — G629 Polyneuropathy, unspecified: Secondary | ICD-10-CM | POA: Diagnosis not present

## 2022-12-18 DIAGNOSIS — N390 Urinary tract infection, site not specified: Secondary | ICD-10-CM | POA: Diagnosis not present

## 2022-12-18 DIAGNOSIS — T84030D Mechanical loosening of internal right hip prosthetic joint, subsequent encounter: Secondary | ICD-10-CM | POA: Diagnosis not present

## 2022-12-18 DIAGNOSIS — Z96651 Presence of right artificial knee joint: Secondary | ICD-10-CM | POA: Diagnosis not present

## 2022-12-18 DIAGNOSIS — U071 COVID-19: Secondary | ICD-10-CM | POA: Diagnosis not present

## 2022-12-18 DIAGNOSIS — Z791 Long term (current) use of non-steroidal anti-inflammatories (NSAID): Secondary | ICD-10-CM | POA: Diagnosis not present

## 2022-12-18 DIAGNOSIS — A0472 Enterocolitis due to Clostridium difficile, not specified as recurrent: Secondary | ICD-10-CM | POA: Diagnosis not present

## 2022-12-18 DIAGNOSIS — Z87442 Personal history of urinary calculi: Secondary | ICD-10-CM | POA: Diagnosis not present

## 2022-12-18 DIAGNOSIS — Z8541 Personal history of malignant neoplasm of cervix uteri: Secondary | ICD-10-CM | POA: Diagnosis not present

## 2022-12-18 DIAGNOSIS — N179 Acute kidney failure, unspecified: Secondary | ICD-10-CM | POA: Diagnosis not present

## 2022-12-18 DIAGNOSIS — D631 Anemia in chronic kidney disease: Secondary | ICD-10-CM | POA: Diagnosis not present

## 2022-12-18 DIAGNOSIS — T8451XA Infection and inflammatory reaction due to internal right hip prosthesis, initial encounter: Secondary | ICD-10-CM | POA: Diagnosis not present

## 2022-12-18 DIAGNOSIS — A419 Sepsis, unspecified organism: Secondary | ICD-10-CM | POA: Diagnosis not present

## 2022-12-18 DIAGNOSIS — Z87891 Personal history of nicotine dependence: Secondary | ICD-10-CM | POA: Diagnosis not present

## 2022-12-18 DIAGNOSIS — Z85828 Personal history of other malignant neoplasm of skin: Secondary | ICD-10-CM | POA: Diagnosis not present

## 2022-12-18 DIAGNOSIS — M0800 Unspecified juvenile rheumatoid arthritis of unspecified site: Secondary | ICD-10-CM | POA: Diagnosis not present

## 2022-12-21 DIAGNOSIS — Z96649 Presence of unspecified artificial hip joint: Secondary | ICD-10-CM | POA: Diagnosis not present

## 2022-12-27 ENCOUNTER — Emergency Department: Payer: Medicare HMO

## 2022-12-27 ENCOUNTER — Inpatient Hospital Stay
Admission: EM | Admit: 2022-12-27 | Discharge: 2023-01-19 | DRG: 871 | Disposition: A | Discharge status: Home Health | Payer: Medicare HMO | Attending: Student | Admitting: Student

## 2022-12-27 DIAGNOSIS — E509 Vitamin A deficiency, unspecified: Secondary | ICD-10-CM | POA: Diagnosis present

## 2022-12-27 DIAGNOSIS — U071 COVID-19: Secondary | ICD-10-CM | POA: Diagnosis not present

## 2022-12-27 DIAGNOSIS — R6521 Severe sepsis with septic shock: Secondary | ICD-10-CM | POA: Diagnosis present

## 2022-12-27 DIAGNOSIS — K5100 Ulcerative (chronic) pancolitis without complications: Secondary | ICD-10-CM | POA: Insufficient documentation

## 2022-12-27 DIAGNOSIS — E872 Acidosis, unspecified: Secondary | ICD-10-CM | POA: Diagnosis present

## 2022-12-27 DIAGNOSIS — Z8541 Personal history of malignant neoplasm of cervix uteri: Secondary | ICD-10-CM

## 2022-12-27 DIAGNOSIS — D631 Anemia in chronic kidney disease: Secondary | ICD-10-CM | POA: Diagnosis present

## 2022-12-27 DIAGNOSIS — E876 Hypokalemia: Secondary | ICD-10-CM | POA: Diagnosis present

## 2022-12-27 DIAGNOSIS — Z885 Allergy status to narcotic agent status: Secondary | ICD-10-CM

## 2022-12-27 DIAGNOSIS — N179 Acute kidney failure, unspecified: Secondary | ICD-10-CM | POA: Diagnosis present

## 2022-12-27 DIAGNOSIS — E559 Vitamin D deficiency, unspecified: Secondary | ICD-10-CM | POA: Diagnosis present

## 2022-12-27 DIAGNOSIS — A414 Sepsis due to anaerobes: Principal | ICD-10-CM | POA: Diagnosis present

## 2022-12-27 DIAGNOSIS — R109 Unspecified abdominal pain: Secondary | ICD-10-CM

## 2022-12-27 DIAGNOSIS — I959 Hypotension, unspecified: Secondary | ICD-10-CM | POA: Diagnosis not present

## 2022-12-27 DIAGNOSIS — N132 Hydronephrosis with renal and ureteral calculous obstruction: Secondary | ICD-10-CM | POA: Diagnosis present

## 2022-12-27 DIAGNOSIS — A419 Sepsis, unspecified organism: Secondary | ICD-10-CM

## 2022-12-27 DIAGNOSIS — I82811 Embolism and thrombosis of superficial veins of right lower extremities: Secondary | ICD-10-CM | POA: Diagnosis present

## 2022-12-27 DIAGNOSIS — Z8616 Personal history of COVID-19: Secondary | ICD-10-CM

## 2022-12-27 DIAGNOSIS — Z96651 Presence of right artificial knee joint: Secondary | ICD-10-CM | POA: Diagnosis present

## 2022-12-27 DIAGNOSIS — R5383 Other fatigue: Secondary | ICD-10-CM | POA: Diagnosis not present

## 2022-12-27 DIAGNOSIS — R197 Diarrhea, unspecified: Principal | ICD-10-CM

## 2022-12-27 DIAGNOSIS — T502X5A Adverse effect of carbonic-anhydrase inhibitors, benzothiadiazides and other diuretics, initial encounter: Secondary | ICD-10-CM | POA: Diagnosis present

## 2022-12-27 DIAGNOSIS — A0472 Enterocolitis due to Clostridium difficile, not specified as recurrent: Secondary | ICD-10-CM | POA: Diagnosis present

## 2022-12-27 DIAGNOSIS — R579 Shock, unspecified: Secondary | ICD-10-CM | POA: Diagnosis not present

## 2022-12-27 DIAGNOSIS — M461 Sacroiliitis, not elsewhere classified: Secondary | ICD-10-CM | POA: Diagnosis present

## 2022-12-27 DIAGNOSIS — Z743 Need for continuous supervision: Secondary | ICD-10-CM | POA: Diagnosis not present

## 2022-12-27 DIAGNOSIS — E871 Hypo-osmolality and hyponatremia: Secondary | ICD-10-CM | POA: Diagnosis present

## 2022-12-27 DIAGNOSIS — R000 Tachycardia, unspecified: Secondary | ICD-10-CM | POA: Diagnosis not present

## 2022-12-27 DIAGNOSIS — I5031 Acute diastolic (congestive) heart failure: Secondary | ICD-10-CM | POA: Diagnosis present

## 2022-12-27 DIAGNOSIS — Z96643 Presence of artificial hip joint, bilateral: Secondary | ICD-10-CM | POA: Diagnosis present

## 2022-12-27 DIAGNOSIS — Z87891 Personal history of nicotine dependence: Secondary | ICD-10-CM

## 2022-12-27 DIAGNOSIS — R578 Other shock: Secondary | ICD-10-CM | POA: Diagnosis present

## 2022-12-27 DIAGNOSIS — E8809 Other disorders of plasma-protein metabolism, not elsewhere classified: Secondary | ICD-10-CM | POA: Diagnosis present

## 2022-12-27 DIAGNOSIS — I2510 Atherosclerotic heart disease of native coronary artery without angina pectoris: Secondary | ICD-10-CM | POA: Diagnosis present

## 2022-12-27 DIAGNOSIS — Z8619 Personal history of other infectious and parasitic diseases: Secondary | ICD-10-CM | POA: Insufficient documentation

## 2022-12-27 DIAGNOSIS — Z79899 Other long term (current) drug therapy: Secondary | ICD-10-CM

## 2022-12-27 DIAGNOSIS — N134 Hydroureter: Secondary | ICD-10-CM | POA: Diagnosis not present

## 2022-12-27 DIAGNOSIS — Z809 Family history of malignant neoplasm, unspecified: Secondary | ICD-10-CM

## 2022-12-27 DIAGNOSIS — R110 Nausea: Secondary | ICD-10-CM | POA: Diagnosis not present

## 2022-12-27 DIAGNOSIS — Z880 Allergy status to penicillin: Secondary | ICD-10-CM

## 2022-12-27 DIAGNOSIS — N1832 Chronic kidney disease, stage 3b: Secondary | ICD-10-CM | POA: Diagnosis present

## 2022-12-27 DIAGNOSIS — A0471 Enterocolitis due to Clostridium difficile, recurrent: Secondary | ICD-10-CM | POA: Diagnosis present

## 2022-12-27 DIAGNOSIS — M069 Rheumatoid arthritis, unspecified: Secondary | ICD-10-CM | POA: Diagnosis present

## 2022-12-27 DIAGNOSIS — R531 Weakness: Secondary | ICD-10-CM

## 2022-12-27 DIAGNOSIS — Z8249 Family history of ischemic heart disease and other diseases of the circulatory system: Secondary | ICD-10-CM

## 2022-12-27 DIAGNOSIS — N1411 Contrast-induced nephropathy: Secondary | ICD-10-CM | POA: Diagnosis present

## 2022-12-27 DIAGNOSIS — Z803 Family history of malignant neoplasm of breast: Secondary | ICD-10-CM

## 2022-12-27 DIAGNOSIS — T508X5A Adverse effect of diagnostic agents, initial encounter: Secondary | ICD-10-CM | POA: Diagnosis present

## 2022-12-27 DIAGNOSIS — I130 Hypertensive heart and chronic kidney disease with heart failure and stage 1 through stage 4 chronic kidney disease, or unspecified chronic kidney disease: Secondary | ICD-10-CM | POA: Diagnosis present

## 2022-12-27 DIAGNOSIS — R1030 Lower abdominal pain, unspecified: Secondary | ICD-10-CM | POA: Diagnosis not present

## 2022-12-27 HISTORY — DX: Chronic kidney disease, stage 3b: N18.32

## 2022-12-27 LAB — CBC WITH DIFFERENTIAL/PLATELET
Abs Immature Granulocytes: 0.25 10*3/uL — ABNORMAL HIGH (ref 0.00–0.07)
Basophils Absolute: 0.1 10*3/uL (ref 0.0–0.1)
Basophils Relative: 0 %
Eosinophils Absolute: 0.2 10*3/uL (ref 0.0–0.5)
Eosinophils Relative: 1 %
HCT: 40.7 % (ref 36.0–46.0)
Hemoglobin: 12.8 g/dL (ref 12.0–15.0)
Immature Granulocytes: 1 %
Lymphocytes Relative: 5 %
Lymphs Abs: 1.4 10*3/uL (ref 0.7–4.0)
MCH: 27.7 pg (ref 26.0–34.0)
MCHC: 31.4 g/dL (ref 30.0–36.0)
MCV: 88.1 fL (ref 80.0–100.0)
Monocytes Absolute: 0.9 10*3/uL (ref 0.1–1.0)
Monocytes Relative: 3 %
Neutro Abs: 26.3 10*3/uL — ABNORMAL HIGH (ref 1.7–7.7)
Neutrophils Relative %: 90 %
Platelets: 468 10*3/uL — ABNORMAL HIGH (ref 150–400)
RBC: 4.62 MIL/uL (ref 3.87–5.11)
RDW: 13.4 % (ref 11.5–15.5)
Smear Review: NORMAL
WBC: 29 10*3/uL — ABNORMAL HIGH (ref 4.0–10.5)
nRBC: 0 % (ref 0.0–0.2)

## 2022-12-27 LAB — COMPREHENSIVE METABOLIC PANEL
ALT: 7 U/L (ref 0–44)
AST: 15 U/L (ref 15–41)
Albumin: 2.5 g/dL — ABNORMAL LOW (ref 3.5–5.0)
Alkaline Phosphatase: 114 U/L (ref 38–126)
Anion gap: 9 (ref 5–15)
BUN: 23 mg/dL (ref 8–23)
CO2: 22 mmol/L (ref 22–32)
Calcium: 8 mg/dL — ABNORMAL LOW (ref 8.9–10.3)
Chloride: 100 mmol/L (ref 98–111)
Creatinine, Ser: 1.35 mg/dL — ABNORMAL HIGH (ref 0.44–1.00)
GFR, Estimated: 42 mL/min — ABNORMAL LOW (ref >60)
Glucose, Bld: 119 mg/dL — ABNORMAL HIGH (ref 70–99)
Potassium: 3.7 mmol/L (ref 3.5–5.1)
Sodium: 131 mmol/L — ABNORMAL LOW (ref 135–145)
Total Bilirubin: 0.8 mg/dL (ref 0.3–1.2)
Total Protein: 6.5 g/dL (ref 6.5–8.1)

## 2022-12-27 MED ORDER — ONDANSETRON HCL 4 MG/2ML IJ SOLN
4.0000 mg | Freq: Once | INTRAMUSCULAR | Status: AC
  Administered 2022-12-27: 4 mg via INTRAVENOUS
  Filled 2022-12-27: qty 2

## 2022-12-27 MED ORDER — IOHEXOL 300 MG/ML  SOLN
75.0000 mL | Freq: Once | INTRAMUSCULAR | Status: AC | PRN
  Administered 2022-12-27: 75 mL via INTRAVENOUS

## 2022-12-27 MED ORDER — METRONIDAZOLE 500 MG/100ML IV SOLN
500.0000 mg | Freq: Once | INTRAVENOUS | Status: AC
  Administered 2022-12-28: 500 mg via INTRAVENOUS
  Filled 2022-12-27: qty 100

## 2022-12-27 MED ORDER — SODIUM CHLORIDE 0.9 % IV BOLUS
1000.0000 mL | Freq: Once | INTRAVENOUS | Status: AC
  Administered 2022-12-27: 1000 mL via INTRAVENOUS

## 2022-12-27 NOTE — ED Provider Notes (Signed)
[facility] Provider Note    Event Date/Time   First MD Initiated Contact with Patient 12/27/22 2134     (approximate)   History   Diarrhea   HPI  Jeanne [name] is a 72 y.o. female who presents to the emergency department today because of concerns for diarrhea.  Has been present for the past 4 days.  She states she has had multiple episodes of diarrhea.  She does have history of C. difficile roughly 1 month ago.  She states the smell and consistency of the stool over the past 4 days reminds her of her C. difficile.  This has been accompanied by abdominal pain.  She denies any fevers.     Physical Exam   Triage Vital Signs: ED Triage Vitals  Enc Vitals Group     BP 12/27/22 2131 (!) 88/60     Pulse Rate 12/27/22 2130 (!) 122     Resp 12/27/22 2130 20     Temp 12/27/22 2131 99.4 F (37.4 C)     Temp Source 12/27/22 2130 Oral     SpO2 12/27/22 2130 100 %     Weight --      Height --      Head Circumference --      Peak Flow --      Pain Score 12/27/22 2129 8     Pain Loc --      Pain Edu? --      Excl. in GC? --     Most recent vital signs: Vitals:   12/27/22 2130 12/27/22 2131  BP:  (!) 88/60  Pulse: (!) 122   Resp: 20   Temp:  99.4 F (37.4 C)  SpO2: 100%    General: Awake, alert, oriented. CV:  Good peripheral perfusion. Tachycardia Resp:  Normal effort. Lungs clear. Abd:  No distention. Minimally tender diffusely    ED Results / Procedures / Treatments   Labs (all labs ordered are listed, but only abnormal results are displayed) Labs Reviewed  CBC WITH DIFFERENTIAL/PLATELET - Abnormal; Notable for the following components:      Result Value   WBC 29.0 (*)    Platelets 468 (*)    Neutro Abs 26.3 (*)    Abs Immature Granulocytes 0.25 (*)    All other components within normal limits  COMPREHENSIVE METABOLIC PANEL - Abnormal; Notable for the following components:   Sodium 131 (*)    Glucose, Bld 119 (*)    Creatinine,  Ser 1.35 (*)    Calcium 8.0 (*)    Albumin 2.5 (*)    GFR, Estimated 42 (*)    All other components within normal limits  C DIFFICILE QUICK SCREEN W PCR REFLEX    GASTROINTESTINAL PANEL BY PCR, STOOL (REPLACES STOOL CULTURE)     EKG  I, Phineas Semen, attending physician, personally viewed and interpreted this EKG  EKG Time: 2135 Rate: 117 Rhythm: sinus tachycardia Axis: right axis deviation Intervals: qtc 457 QRS: narrow ST changes: no st elevation Impression: abnormal ekg    RADIOLOGY CT pending    PROCEDURES:  Critical Care performed: No    MEDICATIONS ORDERED IN ED: Medications - No data to display   IMPRESSION / MDM / ASSESSMENT AND PLAN / ED COURSE  I reviewed the triage vital signs and the nursing notes.  Differential diagnosis includes, but is not limited to, c. Dif, diverticulitis, gastroenteritis  Patient's presentation is most consistent with acute presentation with potential threat to life or bodily function.   The patient is on the cardiac monitor to evaluate for evidence of arrhythmia and/or significant heart rate changes.  Patient presented to the emergency department today because of concerns for diarrhea as well as some abdominal pain.  Patient has history of C. difficile and this reminds her of previous episode.  On exam patient was hypotensive and tachycardic.  Did start IV fluids.  Blood work shows a significant leukocytosis.  Did order stool studies.  Awaiting results of stool studies and CT scan at time of signout.      FINAL CLINICAL IMPRESSION(S) / ED DIAGNOSES   Final diagnoses:  Diarrhea, unspecified type  Abdominal pain, unspecified abdominal location     Note:  This document was prepared using Dragon voice recognition software and may include unintentional dictation errors.    Phineas Semen, MD 12/27/22 [phone]

## 2022-12-27 NOTE — ED Provider Notes (Signed)
11:40 PM  Assumed care at shift change.  Patient here for diarrhea x 4 days.  History of C. difficile.  Leukocytosis of 29,000.  Tachycardic and hypotensive here.  CT of the abdomen pelvis, stool studies pending.  Will add on sepsis workup with cultures, lactic and continue IV fluids.  Will give IV Flagyl.  Will hold further antibiotics at this time given concern for C. difficile.  2:49 AM  Pt's CT scan shows diffuse pancolitis.  Patient still having systolic blood pressures in the 80s despite IV fluids.  Will start IV Levophed.  Will discuss with ICU for admission for sepsis from colitis.   2:52 AM  Consulted and discussed patient's case with ICU provider Cheryll Cockayne Chest Rust NP. I have recommended admission and consulting provider agrees and will place admission orders.  Patient (and family if present) agree with this plan.   I reviewed all nursing notes, vitals, pertinent previous records.  All labs, EKGs, imaging ordered have been independently reviewed and interpreted by myself.   CRITICAL CARE Performed by: Rochele Raring   Total critical care time: 35 minutes  Critical care time was exclusive of separately billable procedures and treating other patients.  Critical care was necessary to treat or prevent imminent or life-threatening deterioration.  Critical care was time spent personally by me on the following activities: development of treatment plan with patient and/or surrogate as well as nursing, discussions with consultants, evaluation of patient's response to treatment, examination of patient, obtaining history from patient or surrogate, ordering and performing treatments and interventions, ordering and review of laboratory studies, ordering and review of radiographic studies, pulse oximetry and re-evaluation of patient's condition.    Jamarquis Crull, Layla Maw, DO 12/28/22 [phone]

## 2022-12-27 NOTE — Progress Notes (Signed)
CODE SEPSIS - PHARMACY COMMUNICATION  **Broad Spectrum Antibiotics should be administered within 1 hour of Sepsis diagnosis**  Time Code Sepsis Called/Page Received: 2353  Antibiotics Ordered: Flagyl  Time of 1st antibiotic administration: 0029  Otelia Sergeant, PharmD, [facility] 12/27/2022 11:54 PM

## 2022-12-28 ENCOUNTER — Other Ambulatory Visit (HOSPITAL_COMMUNITY): Payer: Self-pay

## 2022-12-28 ENCOUNTER — Other Ambulatory Visit: Payer: Self-pay

## 2022-12-28 ENCOUNTER — Encounter: Payer: Self-pay | Admitting: Pulmonary Disease

## 2022-12-28 DIAGNOSIS — K5100 Ulcerative (chronic) pancolitis without complications: Secondary | ICD-10-CM | POA: Diagnosis not present

## 2022-12-28 DIAGNOSIS — M79662 Pain in left lower leg: Secondary | ICD-10-CM | POA: Diagnosis not present

## 2022-12-28 DIAGNOSIS — I82811 Embolism and thrombosis of superficial veins of right lower extremities: Secondary | ICD-10-CM | POA: Diagnosis not present

## 2022-12-28 DIAGNOSIS — T84038A Mechanical loosening of other internal prosthetic joint, initial encounter: Secondary | ICD-10-CM | POA: Diagnosis not present

## 2022-12-28 DIAGNOSIS — I2510 Atherosclerotic heart disease of native coronary artery without angina pectoris: Secondary | ICD-10-CM | POA: Diagnosis not present

## 2022-12-28 DIAGNOSIS — A0472 Enterocolitis due to Clostridium difficile, not specified as recurrent: Secondary | ICD-10-CM | POA: Diagnosis not present

## 2022-12-28 DIAGNOSIS — M25551 Pain in right hip: Secondary | ICD-10-CM | POA: Diagnosis not present

## 2022-12-28 DIAGNOSIS — R578 Other shock: Secondary | ICD-10-CM | POA: Diagnosis not present

## 2022-12-28 DIAGNOSIS — E8809 Other disorders of plasma-protein metabolism, not elsewhere classified: Secondary | ICD-10-CM | POA: Diagnosis not present

## 2022-12-28 DIAGNOSIS — N1330 Unspecified hydronephrosis: Secondary | ICD-10-CM | POA: Diagnosis not present

## 2022-12-28 DIAGNOSIS — R579 Shock, unspecified: Secondary | ICD-10-CM | POA: Diagnosis not present

## 2022-12-28 DIAGNOSIS — Z87891 Personal history of nicotine dependence: Secondary | ICD-10-CM | POA: Diagnosis not present

## 2022-12-28 DIAGNOSIS — Z96649 Presence of unspecified artificial hip joint: Secondary | ICD-10-CM | POA: Diagnosis not present

## 2022-12-28 DIAGNOSIS — N179 Acute kidney failure, unspecified: Secondary | ICD-10-CM | POA: Diagnosis not present

## 2022-12-28 DIAGNOSIS — Z8616 Personal history of COVID-19: Secondary | ICD-10-CM | POA: Diagnosis not present

## 2022-12-28 DIAGNOSIS — E872 Acidosis, unspecified: Secondary | ICD-10-CM | POA: Diagnosis not present

## 2022-12-28 DIAGNOSIS — Z79899 Other long term (current) drug therapy: Secondary | ICD-10-CM | POA: Diagnosis not present

## 2022-12-28 DIAGNOSIS — D631 Anemia in chronic kidney disease: Secondary | ICD-10-CM | POA: Diagnosis not present

## 2022-12-28 DIAGNOSIS — R109 Unspecified abdominal pain: Secondary | ICD-10-CM | POA: Diagnosis not present

## 2022-12-28 DIAGNOSIS — I509 Heart failure, unspecified: Secondary | ICD-10-CM | POA: Diagnosis not present

## 2022-12-28 DIAGNOSIS — A414 Sepsis due to anaerobes: Secondary | ICD-10-CM | POA: Diagnosis not present

## 2022-12-28 DIAGNOSIS — Z96651 Presence of right artificial knee joint: Secondary | ICD-10-CM | POA: Diagnosis not present

## 2022-12-28 DIAGNOSIS — R6521 Severe sepsis with septic shock: Secondary | ICD-10-CM | POA: Diagnosis not present

## 2022-12-28 DIAGNOSIS — A0471 Enterocolitis due to Clostridium difficile, recurrent: Secondary | ICD-10-CM | POA: Diagnosis not present

## 2022-12-28 DIAGNOSIS — N132 Hydronephrosis with renal and ureteral calculous obstruction: Secondary | ICD-10-CM | POA: Diagnosis not present

## 2022-12-28 DIAGNOSIS — R609 Edema, unspecified: Secondary | ICD-10-CM | POA: Diagnosis not present

## 2022-12-28 DIAGNOSIS — Z8619 Personal history of other infectious and parasitic diseases: Secondary | ICD-10-CM | POA: Diagnosis not present

## 2022-12-28 DIAGNOSIS — M069 Rheumatoid arthritis, unspecified: Secondary | ICD-10-CM | POA: Diagnosis not present

## 2022-12-28 DIAGNOSIS — I130 Hypertensive heart and chronic kidney disease with heart failure and stage 1 through stage 4 chronic kidney disease, or unspecified chronic kidney disease: Secondary | ICD-10-CM | POA: Diagnosis not present

## 2022-12-28 DIAGNOSIS — I959 Hypotension, unspecified: Secondary | ICD-10-CM | POA: Diagnosis not present

## 2022-12-28 DIAGNOSIS — U071 COVID-19: Secondary | ICD-10-CM | POA: Diagnosis not present

## 2022-12-28 DIAGNOSIS — N1832 Chronic kidney disease, stage 3b: Secondary | ICD-10-CM | POA: Diagnosis not present

## 2022-12-28 DIAGNOSIS — N134 Hydroureter: Secondary | ICD-10-CM | POA: Diagnosis not present

## 2022-12-28 DIAGNOSIS — M79661 Pain in right lower leg: Secondary | ICD-10-CM | POA: Diagnosis not present

## 2022-12-28 DIAGNOSIS — E876 Hypokalemia: Secondary | ICD-10-CM | POA: Diagnosis not present

## 2022-12-28 DIAGNOSIS — I5031 Acute diastolic (congestive) heart failure: Secondary | ICD-10-CM | POA: Diagnosis not present

## 2022-12-28 DIAGNOSIS — E871 Hypo-osmolality and hyponatremia: Secondary | ICD-10-CM | POA: Diagnosis not present

## 2022-12-28 DIAGNOSIS — M7989 Other specified soft tissue disorders: Secondary | ICD-10-CM | POA: Diagnosis not present

## 2022-12-28 DIAGNOSIS — Z8249 Family history of ischemic heart disease and other diseases of the circulatory system: Secondary | ICD-10-CM | POA: Diagnosis not present

## 2022-12-28 LAB — COMPREHENSIVE METABOLIC PANEL
ALT: 8 U/L (ref 0–44)
AST: 15 U/L (ref 15–41)
Albumin: 2.1 g/dL — ABNORMAL LOW (ref 3.5–5.0)
Alkaline Phosphatase: 94 U/L (ref 38–126)
Anion gap: 10 (ref 5–15)
BUN: 21 mg/dL (ref 8–23)
CO2: 18 mmol/L — ABNORMAL LOW (ref 22–32)
Calcium: 7.3 mg/dL — ABNORMAL LOW (ref 8.9–10.3)
Chloride: 105 mmol/L (ref 98–111)
Creatinine, Ser: 1.14 mg/dL — ABNORMAL HIGH (ref 0.44–1.00)
GFR, Estimated: 52 mL/min — ABNORMAL LOW (ref >60)
Glucose, Bld: 95 mg/dL (ref 70–99)
Potassium: 4.2 mmol/L (ref 3.5–5.1)
Sodium: 133 mmol/L — ABNORMAL LOW (ref 135–145)
Total Bilirubin: 1.1 mg/dL (ref 0.3–1.2)
Total Protein: 5.6 g/dL — ABNORMAL LOW (ref 6.5–8.1)

## 2022-12-28 LAB — BLOOD CULTURE ID PANEL (REFLEXED) - BCID2

## 2022-12-28 LAB — RESP PANEL BY RT-PCR (RSV, FLU A&B, COVID)  RVPGX2
Influenza A by PCR: NEGATIVE
Influenza B by PCR: NEGATIVE
Resp Syncytial Virus by PCR: NEGATIVE
SARS Coronavirus 2 by RT PCR: POSITIVE — AB

## 2022-12-28 LAB — GLUCOSE, CAPILLARY
Glucose-Capillary: 100 mg/dL — ABNORMAL HIGH (ref 70–99)
Glucose-Capillary: 110 mg/dL — ABNORMAL HIGH (ref 70–99)
Glucose-Capillary: 81 mg/dL (ref 70–99)
Glucose-Capillary: 82 mg/dL (ref 70–99)
Glucose-Capillary: 85 mg/dL (ref 70–99)
Glucose-Capillary: 99 mg/dL (ref 70–99)

## 2022-12-28 LAB — PROCALCITONIN: Procalcitonin: 0.58 ng/mL

## 2022-12-28 LAB — CBC
HCT: 33.3 % — ABNORMAL LOW (ref 36.0–46.0)
Hemoglobin: 10.7 g/dL — ABNORMAL LOW (ref 12.0–15.0)
MCH: 28.1 pg (ref 26.0–34.0)
MCHC: 32.1 g/dL (ref 30.0–36.0)
MCV: 87.4 fL (ref 80.0–100.0)
Platelets: 379 10*3/uL (ref 150–400)
RBC: 3.81 MIL/uL — ABNORMAL LOW (ref 3.87–5.11)
RDW: 13.5 % (ref 11.5–15.5)
WBC: 23.9 10*3/uL — ABNORMAL HIGH (ref 4.0–10.5)
nRBC: 0 % (ref 0.0–0.2)

## 2022-12-28 LAB — LACTIC ACID, PLASMA
Lactic Acid, Venous: 1.3 mmol/L (ref 0.5–1.9)
Lactic Acid, Venous: 0.8 mmol/L (ref 0.5–1.9)

## 2022-12-28 LAB — MAGNESIUM: Magnesium: 1.7 mg/dL (ref 1.7–2.4)

## 2022-12-28 LAB — PHOSPHORUS: Phosphorus: 2.8 mg/dL (ref 2.5–4.6)

## 2022-12-28 LAB — CULTURE, BLOOD (ROUTINE X 2)

## 2022-12-28 MED ORDER — SODIUM CHLORIDE 0.9 % IV BOLUS (SEPSIS)
250.0000 mL | Freq: Once | INTRAVENOUS | Status: AC
  Administered 2022-12-28: 250 mL via INTRAVENOUS

## 2022-12-28 MED ORDER — VITAMIN C 500 MG PO TABS
500.0000 mg | ORAL_TABLET | Freq: Two times a day (BID) | ORAL | Status: DC
  Administered 2022-12-28 – 2023-01-19 (×44): 500 mg via ORAL
  Filled 2022-12-28 (×44): qty 1

## 2022-12-28 MED ORDER — OXYCODONE HCL 5 MG PO TABS
5.0000 mg | ORAL_TABLET | ORAL | Status: DC | PRN
  Administered 2022-12-28 – 2022-12-31 (×8): 10 mg via ORAL
  Administered 2023-01-01 (×2): 5 mg via ORAL
  Administered 2023-01-01 – 2023-01-13 (×32): 10 mg via ORAL
  Filled 2022-12-28 (×11): qty 2
  Filled 2022-12-28: qty 1
  Filled 2022-12-28 (×18): qty 2
  Filled 2022-12-28: qty 1
  Filled 2022-12-28 (×15): qty 2

## 2022-12-28 MED ORDER — SODIUM CHLORIDE 0.9 % IV SOLN
250.0000 mL | INTRAVENOUS | Status: DC
  Administered 2023-01-12: 250 mL via INTRAVENOUS

## 2022-12-28 MED ORDER — VANCOMYCIN 50 MG/ML ORAL SOLUTION
125.0000 mg | Freq: Four times a day (QID) | ORAL | Status: DC
  Administered 2022-12-28 – 2022-12-31 (×13): 125 mg via ORAL
  Filled 2022-12-28 (×14): qty 2.5

## 2022-12-28 MED ORDER — SODIUM CHLORIDE 0.9 % IV SOLN: INTRAVENOUS | Status: DC

## 2022-12-28 MED ORDER — ADULT MULTIVITAMIN W/MINERALS CH
1.0000 | ORAL_TABLET | Freq: Every day | ORAL | Status: DC
  Administered 2022-12-29 – 2023-01-19 (×22): 1 via ORAL
  Filled 2022-12-28 (×22): qty 1

## 2022-12-28 MED ORDER — BOOST / RESOURCE BREEZE PO LIQD CUSTOM
1.0000 | Freq: Three times a day (TID) | ORAL | Status: DC
  Administered 2022-12-28 (×2): 1 via ORAL

## 2022-12-28 MED ORDER — ALBUMIN HUMAN 25 % IV SOLN
25.0000 g | Freq: Once | INTRAVENOUS | Status: AC
  Administered 2022-12-28: 25 g via INTRAVENOUS
  Filled 2022-12-28: qty 100

## 2022-12-28 MED ORDER — ACETAMINOPHEN 500 MG PO TABS
1000.0000 mg | ORAL_TABLET | Freq: Once | ORAL | Status: AC
  Administered 2022-12-28: 1000 mg via ORAL

## 2022-12-28 MED ORDER — SODIUM CHLORIDE 0.9 % IV BOLUS (SEPSIS)
1000.0000 mL | Freq: Once | INTRAVENOUS | Status: AC
  Administered 2022-12-28: 1000 mL via INTRAVENOUS

## 2022-12-28 MED ORDER — METRONIDAZOLE 500 MG/100ML IV SOLN
500.0000 mg | Freq: Two times a day (BID) | INTRAVENOUS | Status: DC
  Administered 2022-12-28: 500 mg via INTRAVENOUS
  Filled 2022-12-28: qty 100

## 2022-12-28 MED ORDER — MAGNESIUM SULFATE 2 GM/50ML IV SOLN
2.0000 g | Freq: Once | INTRAVENOUS | Status: AC
  Administered 2022-12-28: 2 g via INTRAVENOUS
  Filled 2022-12-28: qty 50

## 2022-12-28 MED ORDER — ENOXAPARIN SODIUM 40 MG/0.4ML IJ SOSY
40.0000 mg | PREFILLED_SYRINGE | INTRAMUSCULAR | Status: DC
  Administered 2022-12-28 – 2023-01-05 (×9): 40 mg via SUBCUTANEOUS
  Filled 2022-12-28 (×9): qty 0.4

## 2022-12-28 MED ORDER — DOCUSATE SODIUM 100 MG PO CAPS: 100.0000 mg | ORAL_CAPSULE | Freq: Two times a day (BID) | ORAL | Status: DC | PRN

## 2022-12-28 MED ORDER — DICLOFENAC SODIUM 75 MG PO TBEC: 75.0000 mg | DELAYED_RELEASE_TABLET | Freq: Two times a day (BID) | ORAL | Status: DC

## 2022-12-28 MED ORDER — ONDANSETRON HCL 4 MG/2ML IJ SOLN
4.0000 mg | Freq: Four times a day (QID) | INTRAMUSCULAR | Status: DC | PRN
  Administered 2022-12-29 – 2023-01-17 (×5): 4 mg via INTRAVENOUS
  Filled 2022-12-28 (×6): qty 2

## 2022-12-28 MED ORDER — POLYETHYLENE GLYCOL 3350 17 G PO PACK: 17.0000 g | PACK | Freq: Every day | ORAL | Status: DC | PRN

## 2022-12-28 MED ORDER — PANTOPRAZOLE SODIUM 40 MG IV SOLR
40.0000 mg | INTRAVENOUS | Status: DC
  Administered 2022-12-28 – 2023-01-03 (×7): 40 mg via INTRAVENOUS
  Filled 2022-12-28 (×7): qty 10

## 2022-12-28 MED ORDER — CHLORHEXIDINE GLUCONATE CLOTH 2 % EX PADS
6.0000 | MEDICATED_PAD | Freq: Every day | CUTANEOUS | Status: DC
  Administered 2022-12-28 – 2023-01-02 (×5): 6 via TOPICAL

## 2022-12-28 MED ORDER — NOREPINEPHRINE 4 MG/250ML-% IV SOLN: 2.0000 ug/min | INTRAVENOUS | Status: DC

## 2022-12-28 NOTE — Progress Notes (Addendum)
Spoke with NP at bedside in ER. Patient not currently on pressor .I.V. team available to start ultrasound guided I.V. if needed.

## 2022-12-28 NOTE — Progress Notes (Signed)
PHARMACY - PHYSICIAN COMMUNICATION CRITICAL VALUE ALERT - BLOOD CULTURE IDENTIFICATION (BCID)  Results for orders placed or performed during the hospital encounter of 12/27/22  Resp panel by RT-PCR (RSV, Flu A&B, Covid) Anterior Nasal Swab     Status: Abnormal   Collection Time: 12/28/22 12:20 AM   Specimen: Anterior Nasal Swab  Result Value Ref Range Status   SARS Coronavirus 2 by RT PCR POSITIVE (A) NEGATIVE Final    Comment: (NOTE) SARS-CoV-2 target nucleic acids are DETECTED.  The SARS-CoV-2 RNA is generally detectable in upper respiratory specimens during the acute phase of infection. Positive results are indicative of the presence of the identified virus, but do not rule out bacterial infection or co-infection with other pathogens not detected by the test. Clinical correlation with patient history and other diagnostic information is necessary to determine patient infection status. The expected result is Negative.  Fact Sheet for Patients: BloggerCourse.com  Fact Sheet for Healthcare Providers: SeriousBroker.it  This test is not yet approved or cleared by the [place] FDA and  has been authorized for detection and/or diagnosis of SARS-CoV-2 by FDA under an Emergency Use Authorization (EUA).  This EUA will remain in effect (meaning this test can be used) for the duration of  the COVID-19 declaration under Section 564(b)(1) of the A ct, 21 U.S.C. section 360bbb-3(b)(1), unless the authorization is terminated or revoked sooner.     Influenza A by PCR NEGATIVE NEGATIVE Final   Influenza B by PCR NEGATIVE NEGATIVE Final    Comment: (NOTE) The Xpert Xpress SARS-CoV-2/FLU/RSV plus assay is intended as an aid in the diagnosis of influenza from Nasopharyngeal swab specimens and should not be used as a sole basis for treatment. Nasal washings and aspirates are unacceptable for Xpert Xpress SARS-CoV-2/FLU/RSV testing.  Fact  Sheet for Patients: BloggerCourse.com  Fact Sheet for Healthcare Providers: SeriousBroker.it  This test is not yet approved or cleared by the [place] FDA and has been authorized for detection and/or diagnosis of SARS-CoV-2 by FDA under an Emergency Use Authorization (EUA). This EUA will remain in effect (meaning this test can be used) for the duration of the COVID-19 declaration under Section 564(b)(1) of the Act, 21 U.S.C. section 360bbb-3(b)(1), unless the authorization is terminated or revoked.     Resp Syncytial Virus by PCR NEGATIVE NEGATIVE Final    Comment: (NOTE) Fact Sheet for Patients: BloggerCourse.com  Fact Sheet for Healthcare Providers: SeriousBroker.it  This test is not yet approved or cleared by the [place] FDA and has been authorized for detection and/or diagnosis of SARS-CoV-2 by FDA under an Emergency Use Authorization (EUA). This EUA will remain in effect (meaning this test can be used) for the duration of the COVID-19 declaration under Section 564(b)(1) of the Act, 21 U.S.C. section 360bbb-3(b)(1), unless the authorization is terminated or revoked.  Performed at [facility], [address] [place], [place] [address]   Blood Culture (routine x 2)     Status: None (Preliminary result)   Collection Time: 12/28/22 12:20 AM   Specimen: BLOOD  Result Value Ref Range Status   Specimen Description BLOOD  LEFT AC  Final   Special Requests   Final    BOTTLES DRAWN AEROBIC AND ANAEROBIC Blood Culture adequate volume   Culture  Setup Time   Final    GRAM POSITIVE COCCI ANAEROBIC BOTTLE ONLY Organism ID to follow CRITICAL RESULT CALLED TO, READ BACK BY AND VERIFIED WITH: Lashanta Elbe @ 2204 12/28/22 LFD Performed at [facility],  [address] [place], [place] [address]    Culture GRAM POSITIVE COCCI  Final   Report Status  PENDING  Incomplete  Blood Culture ID Panel (Reflexed)     Status: Abnormal   Collection Time: 12/28/22 12:20 AM  Result Value Ref Range Status   Enterococcus faecalis NOT DETECTED NOT DETECTED Final   Enterococcus Faecium NOT DETECTED NOT DETECTED Final   Listeria monocytogenes NOT DETECTED NOT DETECTED Final   Staphylococcus species DETECTED (A) NOT DETECTED Final    Comment: CRITICAL RESULT CALLED TO, READ BACK BY AND VERIFIED WITH: Berdell Hostetler @ 2204 12/28/22 LFD    Staphylococcus aureus (BCID) NOT DETECTED NOT DETECTED Final   Staphylococcus epidermidis DETECTED (A) NOT DETECTED Final    Comment: Methicillin (oxacillin) resistant coagulase negative staphylococcus. Possible blood culture contaminant (unless isolated from more than one blood culture draw or clinical case suggests pathogenicity). No antibiotic treatment is indicated for blood  culture contaminants. CRITICAL RESULT CALLED TO, READ BACK BY AND VERIFIED WITH: Hargun Spurling @ 2204 12/28/22 LFD    Staphylococcus lugdunensis NOT DETECTED NOT DETECTED Final   Streptococcus species NOT DETECTED NOT DETECTED Final   Streptococcus agalactiae NOT DETECTED NOT DETECTED Final   Streptococcus pneumoniae NOT DETECTED NOT DETECTED Final   Streptococcus pyogenes NOT DETECTED NOT DETECTED Final   A.calcoaceticus-baumannii NOT DETECTED NOT DETECTED Final   Bacteroides fragilis NOT DETECTED NOT DETECTED Final   Enterobacterales NOT DETECTED NOT DETECTED Final   Enterobacter cloacae complex NOT DETECTED NOT DETECTED Final   Escherichia coli NOT DETECTED NOT DETECTED Final   Klebsiella aerogenes NOT DETECTED NOT DETECTED Final   Klebsiella oxytoca NOT DETECTED NOT DETECTED Final   Klebsiella pneumoniae NOT DETECTED NOT DETECTED Final   Proteus species NOT DETECTED NOT DETECTED Final   Salmonella species NOT DETECTED NOT DETECTED Final   Serratia marcescens NOT DETECTED NOT DETECTED Final   Haemophilus influenzae NOT DETECTED NOT  DETECTED Final   Neisseria meningitidis NOT DETECTED NOT DETECTED Final   Pseudomonas aeruginosa NOT DETECTED NOT DETECTED Final   Stenotrophomonas maltophilia NOT DETECTED NOT DETECTED Final   Candida albicans NOT DETECTED NOT DETECTED Final   Candida auris NOT DETECTED NOT DETECTED Final   Candida glabrata NOT DETECTED NOT DETECTED Final   Candida krusei NOT DETECTED NOT DETECTED Final   Candida parapsilosis NOT DETECTED NOT DETECTED Final   Candida tropicalis NOT DETECTED NOT DETECTED Final   Cryptococcus neoformans/gattii NOT DETECTED NOT DETECTED Final   Methicillin resistance mecA/C DETECTED (A) NOT DETECTED Final    Comment: CRITICAL RESULT CALLED TO, READ BACK BY AND VERIFIED WITH: Malik Ruffino @ 2204 12/28/22 LFD Performed at [facility], [address] [place], [place] [address]   Blood Culture (routine x 2)     Status: None (Preliminary result)   Collection Time: 12/28/22  1:06 AM   Specimen: BLOOD  Result Value Ref Range Status   Specimen Description BLOOD RIGHT FOREARM  Final   Special Requests   Final    BOTTLES DRAWN AEROBIC AND ANAEROBIC Blood Culture results may not be optimal due to an inadequate volume of blood received in culture bottles   Culture   Final    NO GROWTH < 12 HOURS Performed at [facility], [address] [place], [place] [address]    Report Status PENDING  Incomplete    BCID Results: 1 (anaerobic) of 4 bottles w/ Staph Epi, mecA/C resistance detected.  Pt also COVID+.  Pt currently on oral Vancomycin  for possible C. Diff reoccurrence.  Pt afebrile, WBC 23.9 (down from 29).  Name of provider contacted: Reyes Ivan, NP   Changes to prescribed antibiotics required: No changes at this time pending additional BCx results.  Otelia Sergeant, PharmD, [facility] 12/28/2022 11:03 PM

## 2022-12-28 NOTE — Progress Notes (Signed)
eLink Physician-Brief Progress Note Patient Name: Jeanne [name] DOB: 1951/03/31 MRN: [id number]   Date of Service  12/28/2022  HPI/Events of Note  72 year old female who presents to the [facility] emergency department with suspected C. difficile reinfection after recent discharge with C. difficile infection.  She had multiple bouts of nonbloody diarrhea and lower quadrant abdominal pain and was found to be septic with tachycardia, and hypotension.  She was initiated on norepinephrine and referred to the ICU for further management.  eICU Interventions  Started on IV Flagyl, low threshold to initiate concurrent fidaxomicin  Received adequate IV bolus with ongoing NS hydration.  Holding antihypertensives.  Consider holding PPIs  Peripheral norepinephrine as needed.  Enteric precautions in place, no recent bowel movements adequate for sampling.  Can likely advance diet this morning.     Intervention Category Evaluation Type: New Patient Evaluation  [name] 12/28/2022, 5:24 AM

## 2022-12-28 NOTE — Progress Notes (Signed)
Unable to find venous access with or without ultrasound in forearm for infusion of pressors. RN made aware.

## 2022-12-28 NOTE — Progress Notes (Signed)
Initial Nutrition Assessment  DOCUMENTATION CODES:   Not applicable  INTERVENTION:   Boost Breeze p [id number] potassium, magnesium and phosphorus labs daily until stable  Check vitamins A, zinc, D, folate, B12 labs  Daily weights  NUTRITION DIAGNOSIS:   Inadequate oral intake related to acute illness as evidenced by per patient/family report.  GOAL:   Patient will meet greater than or equal to 90% of their needs  MONITOR:   PO intake, Supplement acceptance, Diet advancement, Labs, Weight trends, I & O's, Skin  REASON FOR ASSESSMENT:   Malnutrition Screening Tool    ASSESSMENT:   72 y/o female with h/o HTN, cervical cancer, RA, C. diff, CKD III and recent COVID 19 who is admitted with pancolitis and sepsis.  Met with pt in room today. Pt reports decreased appetite and oral intake for the past several months but reports poor oral intake for the past month. Pt reports that she has not eaten anything since Friday. Pt reports that she had hip surgery in July complicated by infection and required revision in December. Pt reports that she was on antibiotics for 6 weeks and then in December she developed diarrhea which later resolved. Pt admitted in March for C. Diff and COVID 19. Pt reports diarrhea improved for a few weeks after discharge but then started back worse than ever. Pt reports that she has not been able to keep food down at home. Pt has tried numerous "milky" supplements but reports that she is unable to keep them down either. Pt reports that has lost 30lbs over the past month. Per chart, pt is down 20lbs(12%) since December; this is significant weight loss. RD discussed with pt the importance of adequate nutrition needed to preserve lean muscle. Pt is willing to try Boost Breeze. Pt initiated on a clear liquid diet today. RD will add  supplements and vitamins to help pt meet her estimated needs and to support wound healing. Pt is at high refeed risk. Pt at high risk for vitamin deficiencies r/t chronic diarrhea. Pt with h/o B12 and vitamin D deficiencies. RD will check vitamin labs to r/o deficiency.   Medications reviewed and include: lovenox, protonix, NaCl /hr, metronidazole   Labs reviewed: Na 133(L), K 4.2 wnl, creat 1.14(H), P 2.8 wnl, Mg 1.7 wnl Wbc- 23.9(H), Hgb 10.7(L), Hct 33.3(L) Cbgs- 85, 82, 81 x 24 hrs   NUTRITION - FOCUSED PHYSICAL EXAM:  Flowsheet Row Most Recent Value  Orbital Region No depletion  Upper Arm Region No depletion  Thoracic and Lumbar Region No depletion  Buccal Region No depletion  Temple Region No depletion  Clavicle Bone Region Mild depletion  Clavicle and Acromion Bone Region Mild depletion  Scapular Bone Region No depletion  Dorsal Hand Mild depletion  Patellar Region No depletion  Anterior Thigh Region No depletion  Posterior Calf Region No depletion  Edema (RD Assessment) Mild  Hair Reviewed  Eyes Reviewed  Mouth Reviewed  Skin Reviewed  Nails Reviewed   Diet Order:   Diet Order             Diet NPO time specified  Diet effective now                  EDUCATION NEEDS:   Education needs have been addressed  Skin:  Skin Assessment: Reviewed RN Assessment (non  pressure wound buttocks)  Last BM:  4/19- type 7  Height:   Ht Readings from Last 1 Encounters:  12/27/22  (1.626 m)    Weight:   Wt Readings from Last 1 Encounters:  12/28/22 67.6 kg    Ideal Body Weight:  54.5 kg  BMI:  Body mass index is 25.58 kg/m.  Estimated Nutritional Needs:   Kcal:  1600-1800kcal/day  Protein:  80-90g/day  Fluid:  1.5-1.7L/day  Betsey Holiday MS, RD, LDN Please refer to [facility] for RD and/or RD on-call/weekend/after hours pager

## 2022-12-28 NOTE — Plan of Care (Signed)
Continuing with plan of care. 

## 2022-12-28 NOTE — Consult Note (Signed)
[facility]       Reason for Consult:C diff infection, relapse    Referring Physician:  Leanord Asal, NP  Principal Problem:   Shock circulatory    enoxaparin (LOVENOX) injection  40 mg Subcutaneous Q24H   pantoprazole (PROTONIX) IV  40 mg Intravenous Q24H    Recommendations: Vancomycin oral 125 mg 4 times a day  Assessment: Recent C diff infection and here again with diarrhea and positive C diff including toxin.  I am concerned of a relapse.  Though testing can remain positive even without new infection, her presentation is most c/w relapse with diarrhea, significant leukocytosis.  I recommend treatment and have stared vancomycin.  Probably little or no benefit with IV metronidazole. Dificid could be considered to reduce the chance of relapse again, but is cost prohibitive and so will need to see if the patient is willing to pay at discharge.     HPI: Jeanne [name] is a 72 y.o. female with rheumatoid arthritis on NSAIDS and recent C diff infection last month here with diarrhea, pancolitis on CT and positive C diff testing.    Past Medical History:  Diagnosis Date   Arthritis    RA   Cervical cancer    CKD stage 3b, GFR 30-44 ml/min    Complication of anesthesia    History of kidney stones    Hypertension    PONV (postoperative nausea and vomiting)     Social History   Tobacco Use   Smoking status: Former    Years: 20    Types: Cigarettes    Quit date: 09/09/1989    Years since quitting: 33.3   Smokeless tobacco: Never   Tobacco comments:    QUIT IN 1990  Vaping Use   Vaping Use: Never used  Substance Use Topics   Alcohol use: No    Alcohol/week: 0.0 standard drinks of alcohol   Drug use: No    Family History  Problem Relation Age of Onset   Breast cancer Mother    Hypertension Father    Cancer Father    Prostate cancer Neg Hx    Kidney cancer Neg Hx    Bladder Cancer Neg Hx     Allergies  Allergen Reactions   Codeine  Nausea Only and Nausea And Vomiting   Augmentin [Amoxicillin-Pot Clavulanate] Nausea Only    Very nauseated/ feels sore on body      Lab Results  Component Value Date   WBC 23.9 (H) 12/28/2022   HGB 10.7 (L) 12/28/2022   HCT 33.3 (L) 12/28/2022   MCV 87.4 12/28/2022   PLT 379 12/28/2022    Lab Results  Component Value Date   CREATININE 1.14 (H) 12/28/2022   BUN 21 12/28/2022   NA 133 (L) 12/28/2022   K 4.2 12/28/2022   CL 105 12/28/2022   CO2 18 (L) 12/28/2022    Lab Results  Component Value Date   ALT 8 12/28/2022   AST 15 12/28/2022   ALKPHOS 94 12/28/2022     Microbiology: Recent Results (from the past 240 hour(s))  Resp panel by RT-PCR (RSV, Flu A&B, Covid) Anterior Nasal Swab     Status: Abnormal   Collection Time: 12/28/22 12:20 AM   Specimen: Anterior Nasal Swab  Result Value Ref Range Status   SARS Coronavirus 2 by RT PCR POSITIVE (A) NEGATIVE Final    Comment: (NOTE) SARS-CoV-2 target nucleic acids are DETECTED.  The SARS-CoV-2 RNA is generally detectable in  upper respiratory specimens during the acute phase of infection. Positive results are indicative of the presence of the identified virus, but do not rule out bacterial infection or co-infection with other pathogens not detected by the test. Clinical correlation with patient history and other diagnostic information is necessary to determine patient infection status. The expected result is Negative.  Fact Sheet for Patients: BloggerCourse.com  Fact Sheet for Healthcare Providers: SeriousBroker.it  This test is not yet approved or cleared by the [place] FDA and  has been authorized for detection and/or diagnosis of SARS-CoV-2 by FDA under an Emergency Use Authorization (EUA).  This EUA will remain in effect (meaning this test can be used) for the duration of  the COVID-19 declaration under Section 564(b)(1) of the [place] ct, 21 U.S.C. section  360bbb-3(b)(1), unless the authorization is terminated or revoked sooner.     Influenza A by PCR NEGATIVE NEGATIVE Final   Influenza B by PCR NEGATIVE NEGATIVE Final    Comment: (NOTE) The Xpert Xpress SARS-CoV-2/FLU/RSV plus assay is intended as an aid in the diagnosis of influenza from Nasopharyngeal swab specimens and should not be used as a sole basis for treatment. Nasal washings and aspirates are unacceptable for Xpert Xpress SARS-CoV-2/FLU/RSV testing.  Fact Sheet for Patients: BloggerCourse.com  Fact Sheet for Healthcare Providers: SeriousBroker.it  This test is not yet approved or cleared by the [place] FDA and has been authorized for detection and/or diagnosis of SARS-CoV-2 by FDA under an Emergency Use Authorization (EUA). This EUA will remain in effect (meaning this test can be used) for the duration of the COVID-19 declaration under Section 564(b)(1) of the Act, 21 U.S.C. section 360bbb-3(b)(1), unless the authorization is terminated or revoked.     Resp Syncytial Virus by PCR NEGATIVE NEGATIVE Final    Comment: (NOTE) Fact Sheet for Patients: BloggerCourse.com  Fact Sheet for Healthcare Providers: SeriousBroker.it  This test is not yet approved or cleared by the [place] FDA and has been authorized for detection and/or diagnosis of SARS-CoV-2 by FDA under an Emergency Use Authorization (EUA). This EUA will remain in effect (meaning this test can be used) for the duration of the COVID-19 declaration under Section 564(b)(1) of the Act, 21 U.S.C. section 360bbb-3(b)(1), unless the authorization is terminated or revoked.  Performed at [facility], [address] [place], [place] [address]   Blood Culture (routine x 2)     Status: None (Preliminary result)   Collection Time: 12/28/22 12:20 AM   Specimen: BLOOD  Result Value Ref Range  Status   Specimen Description BLOOD  LEFT AC  Final   Special Requests   Final    BOTTLES DRAWN AEROBIC AND ANAEROBIC Blood Culture adequate volume   Culture   Final    NO GROWTH < 12 HOURS Performed at [facility], [address] [place], [place] [address]    Report Status PENDING  Incomplete  Blood Culture (routine x 2)     Status: None (Preliminary result)   Collection Time: 12/28/22  1:06 AM   Specimen: BLOOD  Result Value Ref Range Status   Specimen Description BLOOD RIGHT FOREARM  Final   Special Requests   Final    BOTTLES DRAWN AEROBIC AND ANAEROBIC Blood Culture results may not be optimal due to an inadequate volume of blood received in culture bottles   Culture   Final    NO GROWTH < 12 HOURS Performed at [facility], [address], [place], [place] [address]  Report Status PENDING  Incomplete    Gardiner Barefoot, MD [facility] [email or web address] 12/28/2022, 1:19 PM

## 2022-12-28 NOTE — Sepsis Progress Note (Signed)
Elink monitoring for the code sepsis protocol.  

## 2022-12-28 NOTE — H&P (Signed)
NAME:  Jeanne [name], MRN:  [id number], DOB:  07-18-1951, LOS: 0 ADMISSION DATE:  12/27/2022, CONSULTATION DATE:  12/28/22 REFERRING MD:  Dr. Elesa Massed, CHIEF COMPLAINT:  Diarrhea   History of Present Illness:  72 yo F presenting to [facility] ED from home for evaluation of diarrhea.  History obtained via chart review, and interview bedside with patient. Of note the patient was recently hospitalized with C.Diff from 11/09/22-11/12/22, discharged with PO vancomycin. She states she completed this antibiotic course as prescribed. She reported that she did have resolution of her symptoms for a few weeks. She also had been COVID 19 + during this admission, but was asymptomatic and did not require treatment. She reports that over the past week she has been nauseous and had poor PO intake, but denied vomiting. Over the last 4 days she has had multiple bouts of non-bloody diarrhea and bilateral lower quadrant abdominal pain/cramping. She reported these episodes seemed exactly the same in consistency and smell as during her previous C.Diff infection. She also reports generalized fatigue. She denies fever/chills, urinary symptoms, shortness of breath, productive cough, chest pain, dizziness, body aches, falls, headache or blurred vision.   ED course: Upon arrival patient met SIRS criteria with tachycardia and hypotension. Labs significant for leukocytosis without lactic acidosis, mild hyponatremia & hypoalbuminemia. Cr elevated but does appear to be at baseline since Dec 2023. Patient also COVID-19 + again, asymptomatic. Imaging revealed diffuse pan colitis. Patient fluid resuscitated but remains hypotensive, EDP initiated levophed drip and PCCM consulted for admission. Medications given: Tylenol, zofran, flagyl, 2,250 mL IVF bolus, IV contrast Initial Vitals: 99.4, 12, 117, 90/66 & 97% on RA Significant labs: (Labs/ Imaging personally reviewed) I, Cheryll Cockayne Rust-Chester, AGACNP-BC, personally viewed and interpreted  this ECG. EKG Interpretation: Date: 12/27/22, EKG Time: 21:35, Rate: 117, Rhythm: ST, QRS Axis:  borderline RAD, Intervals: normal, ST/T Wave abnormalities: none, Narrative Interpretation: ST Chemistry: Na+: 131, K+: 3.7, BUN/Cr.: 23/ 1.35, Serum CO2/ AG: 22/ 9, albumin: 2.5 Hematology: WBC: 29, Hgb: 12.8,  Lactic/ PCT: 1.3/ pending,  COVID-19 & Influenza A/B: COVID +   CT abdomen/ pelvis with contrast 12/28/22: Infectious/inflammatory pancolitis. No drainable fluid collection/abscess. No free air.  PCCM consulted for admission due to suspected sepsis with circulatory shock secondary to colitis requiring peripheral vasopressor support.  Pertinent  Medical History  HTN Cervical cancer Rheumatoid Arthritis  C.Difficile infection Former smoker- remote CKD Stage 3b  Significant Hospital Events: Including procedures, antibiotic start and stop dates in addition to other pertinent events   12/28/22: Admit to ICU with suspected sepsis & circulatory shock secondary to colitis requiring peripheral vasopressor support.  Interim History / Subjective:  Patient alert and oriented, marginal BP without any current complaints. Plan of care discussed all questions and concerns answered at this time  Objective   Blood pressure (!) 86/52, pulse (!) 105, temperature 98.5 F (36.9 C), temperature source Oral, resp. rate 16, height 5\' 4"  (1.626 m), weight 67.6 kg, SpO2 96 %.        Intake/Output Summary (Last 24 hours) at 12/28/2022 0301 Last data filed at 12/28/2022 0246 Gross per 24 hour  Intake 1100 ml  Output --  Net 1100 ml   Filed Weights   12/27/22 2346  Weight: 67.6 kg    Examination: General: Adult female, acutely ill, lying in bed, NAD HEENT: MM pale/dry, anicteric, atraumatic, neck supple Neuro: A&O x 4, able to follow commands, PERRL +3, MAE CV: s1s2 RRR, ST on monitor, no r/m/g Pulm: Regular,  non labored on RA , breath sounds clear-BUL & clear/diminished-BLL GI: soft, rounded,  mild tenderness in bilateral lower quadrants, bs x 4 Skin: limited exam- no rashes/lesions noted Extremities: warm/dry, pulses + 2 R/P, trace edema noted bilateral feet  Resolved Hospital Problem list     Assessment & Plan:  Suspected Sepsis with circulatory shock secondary to pancolitis Possible Recurrent C.Diff infection CT imaging revealed pan colitis, recent history of C.diff. Patient also has history of RA Initial interventions/workup included: 2,250 mL of NS/LR & Metronidazole - GI panel & C.Diff panel ordered - If C.diff consider Fidaxomicin, if infectious w/u negative may need GI evaluation - Supplemental oxygen as needed, to maintain SpO2 > 90% - f/u cultures, trend lactic/ PCT - Daily CBC, monitor WBC/ fever curve - IV antibiotics: flagyl - IVF hydration as needed: NS @ 100 mL/h - Consider vasopressors to maintain MAP< 65: norepinephrine  CKD Stage 3b Mild Hyponatremia - IVF support as above - Strict I/O's: alert provider if UOP < 0.5 mL/kg/hr - Daily BMP, replace electrolytes PRN - Avoid nephrotoxic agents as able, ensure adequate renal perfusion  Rheumatoid Arthritis - holding diclofenac due to pancolitis - continue outpatient oxycodone  Hypertension- history - lopressor discontinued last admit due to marginal BP, continue to hold anti hypertensive medication  COVID-19 infection Appears incidental finding. Patient recently positive in March 2024. Asymptomatic. - monitor  Best Practice (right click and "Reselect all SmartList Selections" daily)  Diet/type: NPO w/ oral meds DVT prophylaxis: LMWH GI prophylaxis: PPI Lines: N/A Foley:  N/A Code Status:  full code Last date of multidisciplinary goals of care discussion [12/28/22]  Labs   CBC: Recent Labs  Lab 12/27/22 2140  WBC 29.0*  NEUTROABS 26.3*  HGB 12.8  HCT 40.7  MCV 88.1  PLT 468*    Basic Metabolic Panel: Recent Labs  Lab 12/27/22 2140  NA 131*  K 3.7  CL 100  CO2 22  GLUCOSE 119*   BUN 23  CREATININE 1.35*  CALCIUM 8.0*   GFR: Estimated Creatinine Clearance: 36.7 mL/min (A) (by C-G formula based on SCr of 1.35 mg/dL (H)). Recent Labs  Lab 12/27/22 2140  WBC 29.0*  LATICACIDVEN 1.3    Liver Function Tests: Recent Labs  Lab 12/27/22 2140  AST 15  ALT 7  ALKPHOS 114  BILITOT 0.8  PROT 6.5  ALBUMIN 2.5*   No results for input(s): "LIPASE", "AMYLASE" in the last 168 hours. No results for input(s): "AMMONIA" in the last 168 hours.  ABG No results found for: "PHART", "PCO2ART", "PO2ART", "HCO3", "TCO2", "ACIDBASEDEF", "O2SAT"   Coagulation Profile: No results for input(s): "INR", "PROTIME" in the last 168 hours.  Cardiac Enzymes: No results for input(s): "CKTOTAL", "CKMB", "CKMBINDEX", "TROPONINI" in the last 168 hours.  HbA1C: No results found for: "HGBA1C"  CBG: No results for input(s): "GLUCAP" in the last 168 hours.  Review of Systems: Positives in BOLD  Gen: Denies fever, chills, weight change, fatigue, night sweats HEENT: Denies blurred vision, double vision, hearing loss, tinnitus, sinus congestion, rhinorrhea, sore throat, neck stiffness, dysphagia PULM: Denies shortness of breath, cough, sputum production, hemoptysis, wheezing CV: Denies chest pain, edema, orthopnea, paroxysmal nocturnal dyspnea, palpitations GI: Denies abdominal pain, nausea, vomiting, diarrhea, hematochezia, melena, constipation, change in bowel habits GU: Denies dysuria, hematuria, polyuria, oliguria, urethral discharge Endocrine: Denies hot or cold intolerance, polyuria, polyphagia or appetite change Derm: Denies rash, dry skin, scaling or peeling skin change Heme: Denies easy bruising, bleeding, bleeding gums Neuro: Denies headache, numbness, weakness,  slurred speech, loss of memory or consciousness  Past Medical History:  She,  has a past medical history of Arthritis, Cervical cancer (HCC), Complication of anesthesia, History of kidney stones, Hypertension, and  PONV (postoperative nausea and vomiting).   Surgical History:   Past Surgical History:  Procedure Laterality Date   ABDOMINAL HYSTERECTOMY  09/10/2003   CYSTOGRAM N/A 08/04/2018   Procedure: CYSTOGRAM WITH URETHAL DILATION;  Surgeon: Vanna Scotland, MD;  Location: [facility] ORS;  Service: Urology;  Laterality: N/A;   CYSTOSCOPY W/ RETROGRADES Bilateral 08/04/2018   Procedure: CYSTOSCOPY WITH RETROGRADE PYELOGRAM;  Surgeon: Vanna Scotland, MD;  Location: [facility] ORS;  Service: Urology;  Laterality: Bilateral;   CYSTOSCOPY/URETEROSCOPY/HOLMIUM LASER/STENT PLACEMENT Left 08/04/2018   Procedure: CYSTOSCOPY/URETEROSCOPY/HOLMIUM LASER/STENT PLACEMENT;  Surgeon: Vanna Scotland, MD;  Location: [facility] ORS;  Service: Urology;  Laterality: Left;   JOINT REPLACEMENT     TOTAL HIP ARTHROPLASTY Right 1972   TOTAL HIP ARTHROPLASTY Left 1981   TOTAL KNEE ARTHROPLASTY Right 1990     Social History:   reports that she quit smoking about 33 years ago. Her smoking use included cigarettes. She has never used smokeless tobacco. She reports that she does not drink alcohol and does not use drugs.   Family History:  Her family history includes Breast cancer in her mother; Cancer in her father; Hypertension in her father. There is no history of Prostate cancer, Kidney cancer, or Bladder Cancer.   Allergies Allergies  Allergen Reactions   Codeine Nausea Only and Nausea And Vomiting   Augmentin [Amoxicillin-Pot Clavulanate] Nausea Only    Very nauseated/ feels sore on body      Home Medications  Prior to Admission medications   Medication Sig Start Date End Date Taking? Authorizing Provider  diclofenac (VOLTAREN) 75 MG EC tablet Take 1 tablet (75 mg total) by mouth 2 (two) times daily. 10/14/20  Yes Margaretann Loveless, PA-C  gabapentin (NEURONTIN) 100 MG capsule Take 100 mg by mouth at bedtime. 10/16/22 10/16/23 Yes [provider]  lactobacillus acidophilus (BACID) TABS tablet Take 1 tablet by mouth daily.    Yes [provider]  lidocaine-prilocaine (EMLA) cream APPLY SPARINGLY 2-3 TIMES A DAY TO AREA OF RADIATION THERAPY IRRITATION. 05/11/20  Yes Bacigalupo, Marzella Schlein, MD  oxyCODONE (OXY IR/ROXICODONE) 5 MG immediate release tablet Take 5-10 mg by mouth every 4 (four) hours as needed. 10/11/21  Yes [provider]  promethazine (PHENERGAN) 25 MG tablet TAKE 0.5 TABLETS (12.5 MG TOTAL) BY MOUTH EVERY 6 (SIX) HOURS AS NEEDED FOR NAUSEA OR VOMITING. 04/06/21  Yes Chrismon, Jodell Cipro, PA-C  tretinoin (RETIN-A) 0.025 % cream Apply topically at bedtime.   Yes [provider]  metoprolol tartrate (LOPRESSOR) 25 MG tablet Hold until outpatient followup due to low blood pressure. Patient not taking: Reported on 12/27/2022 11/12/22   Darlin Priestly, MD  ondansetron (ZOFRAN-ODT) 8 MG disintegrating tablet Take 1 tablet (8 mg total) by mouth every 8 (eight) hours as needed for nausea or vomiting. Patient not taking: Reported on 12/27/2022 08/28/22   Merwyn Katos, MD     Critical care time: 62 minutes       Betsey Holiday, AGACNP-BC Acute Care Nurse Practitioner [facility]   [phone] / [phone] Please see Amion for pager details.

## 2022-12-28 NOTE — TOC Benefit Eligibility Note (Signed)
Patient Product/process development scientist completed.    The patient is currently admitted and upon discharge could be taking vancomycin 125 mg capsules.  The current 10 day co-pay is $99.29.   The patient is currently admitted and upon discharge could be taking Dificid  tablets.  The current 10 day co-pay is $1,607.20.   The patient is insured through [employer] Part D   This test claim was processed through [facility]- copay amounts may vary at other pharmacies due to pharmacy/plan contracts, or as the patient moves through the different stages of their insurance plan.  Roland Earl, CPHT Pharmacy Patient Advocate Specialist [facility] Patient Advocate Team Direct Number: [phone]  Fax: [phone]

## 2022-12-28 NOTE — Consult Note (Signed)
PHARMACY CONSULT NOTE  Pharmacy Consult for Electrolyte Monitoring and Replacement   Recent Labs: Potassium (mmol/L)  Date Value  12/28/2022 4.2   Magnesium (mg/dL)  Date Value  16/06/9603 1.7   Calcium (mg/dL)  Date Value  54/05/8118 7.3 (L)   Albumin (g/dL)  Date Value  14/78/2956 2.1 (L)  10/02/2019 3.9   Phosphorus (mg/dL)  Date Value  21/30/8657 2.8   Sodium (mmol/L)  Date Value  12/28/2022 133 (L)  10/02/2019 146 (H)   Assessment: 72 y/o F with medical history including HTN, cervical cancer, RA, C diff infection, former smoker, CKD stage 3b admitted with circulatory shock secondary to colitis. Patient tested positive for COVID-19 infection, recently positive in 11/2022. Pharmacy consulted to assist with electrolyte monitoring and replacement as indicated.  MIVF: NS at 100 cc/hr  Goal of Therapy:  Electrolytes within normal limits  Plan:  --Mg 1.7, magnesium sulfate 2 g IV x 1 --Follow-up electrolytes with AM labs tomorrow  Tressie Ellis 12/28/2022 8:53 AM

## 2022-12-29 DIAGNOSIS — Z8619 Personal history of other infectious and parasitic diseases: Secondary | ICD-10-CM | POA: Insufficient documentation

## 2022-12-29 DIAGNOSIS — K5100 Ulcerative (chronic) pancolitis without complications: Secondary | ICD-10-CM | POA: Diagnosis not present

## 2022-12-29 LAB — GLUCOSE, CAPILLARY
Glucose-Capillary: 90 mg/dL (ref 70–99)
Glucose-Capillary: 95 mg/dL (ref 70–99)
Glucose-Capillary: 139 mg/dL — ABNORMAL HIGH (ref 70–99)
Glucose-Capillary: 123 mg/dL — ABNORMAL HIGH (ref 70–99)
Glucose-Capillary: 123 mg/dL — ABNORMAL HIGH (ref 70–99)

## 2022-12-29 LAB — CBC
HCT: 30.5 % — ABNORMAL LOW (ref 36.0–46.0)
Hemoglobin: 9.5 g/dL — ABNORMAL LOW (ref 12.0–15.0)
MCH: 27.8 pg (ref 26.0–34.0)
MCHC: 31.1 g/dL (ref 30.0–36.0)
MCV: 89.2 fL (ref 80.0–100.0)
Platelets: 341 10*3/uL (ref 150–400)
RBC: 3.42 MIL/uL — ABNORMAL LOW (ref 3.87–5.11)
RDW: 13.5 % (ref 11.5–15.5)
WBC: 19.1 10*3/uL — ABNORMAL HIGH (ref 4.0–10.5)
nRBC: 0 % (ref 0.0–0.2)

## 2022-12-29 LAB — BASIC METABOLIC PANEL
Anion gap: 8 (ref 5–15)
BUN: 16 mg/dL (ref 8–23)
CO2: 17 mmol/L — ABNORMAL LOW (ref 22–32)
Calcium: 7.4 mg/dL — ABNORMAL LOW (ref 8.9–10.3)
Chloride: 110 mmol/L (ref 98–111)
Creatinine, Ser: 0.99 mg/dL (ref 0.44–1.00)
GFR, Estimated: >60 mL/min (ref >60)
Glucose, Bld: 102 mg/dL — ABNORMAL HIGH (ref 70–99)
Potassium: 3.1 mmol/L — ABNORMAL LOW (ref 3.5–5.1)
Sodium: 135 mmol/L (ref 135–145)

## 2022-12-29 LAB — PHOSPHORUS: Phosphorus: 2.4 mg/dL — ABNORMAL LOW (ref 2.5–4.6)

## 2022-12-29 LAB — VITAMIN D 25 HYDROXY (VIT D DEFICIENCY, FRACTURES): Vit D, 25-Hydroxy: 26.5 ng/mL — ABNORMAL LOW (ref 30–100)

## 2022-12-29 LAB — FOLATE: Folate: 9.3 ng/mL (ref >5.9)

## 2022-12-29 LAB — C DIFFICILE QUICK SCREEN W PCR REFLEX
C Diff antigen: POSITIVE — AB
C Diff interpretation: DETECTED
C Diff toxin: POSITIVE — AB

## 2022-12-29 LAB — VITAMIN B12: Vitamin B-12: 287 pg/mL (ref 180–914)

## 2022-12-29 LAB — MAGNESIUM: Magnesium: 2.4 mg/dL (ref 1.7–2.4)

## 2022-12-29 LAB — CULTURE, BLOOD (ROUTINE X 2)

## 2022-12-29 MED ORDER — MIDODRINE HCL 5 MG PO TABS
5.0000 mg | ORAL_TABLET | Freq: Three times a day (TID) | ORAL | Status: DC
  Administered 2022-12-29 (×2): 5 mg via ORAL
  Filled 2022-12-29 (×2): qty 1

## 2022-12-29 MED ORDER — RISAQUAD PO CAPS
1.0000 | ORAL_CAPSULE | Freq: Every day | ORAL | Status: DC
  Administered 2022-12-30 – 2023-01-01 (×3): 1 via ORAL
  Filled 2022-12-29 (×4): qty 1

## 2022-12-29 MED ORDER — GABAPENTIN 100 MG PO CAPS
100.0000 mg | ORAL_CAPSULE | Freq: Every day | ORAL | Status: DC
  Administered 2022-12-29 – 2023-01-18 (×21): 100 mg via ORAL
  Filled 2022-12-29 (×21): qty 1

## 2022-12-29 MED ORDER — SODIUM CHLORIDE 0.9 % IV BOLUS
1000.0000 mL | Freq: Once | INTRAVENOUS | Status: AC
  Administered 2022-12-29: 1000 mL via INTRAVENOUS

## 2022-12-29 MED ORDER — PROMETHAZINE HCL 25 MG PO TABS
12.5000 mg | ORAL_TABLET | Freq: Four times a day (QID) | ORAL | Status: DC | PRN
  Administered 2022-12-30: 12.5 mg via ORAL
  Filled 2022-12-29 (×2): qty 1

## 2022-12-29 MED ORDER — POTASSIUM CHLORIDE CRYS ER 20 MEQ PO TBCR
40.0000 meq | EXTENDED_RELEASE_TABLET | Freq: Three times a day (TID) | ORAL | Status: AC
  Administered 2022-12-29 (×2): 40 meq via ORAL
  Filled 2022-12-29 (×2): qty 2

## 2022-12-29 NOTE — Progress Notes (Signed)
NAME:  Jeanne [name], MRN:  [id number], DOB:  July 21, 1951, LOS: 1 ADMISSION DATE:  12/27/2022, CONSULTATION DATE:  12/28/22 REFERRING MD:  Dr. Elesa Massed, CHIEF COMPLAINT:  Diarrhea   History of Present Illness:  72 yo F presenting to [facility] ED from home for evaluation of diarrhea.  History obtained via chart review, and interview bedside with patient. Of note the patient was recently hospitalized with C.Diff from 11/09/22-11/12/22, discharged with PO vancomycin. She states she completed this antibiotic course as prescribed. She reported that she did have resolution of her symptoms for a few weeks. She also had been COVID 19 + during this admission, but was asymptomatic and did not require treatment. She reports that over the past week she has been nauseous and had poor PO intake, but denied vomiting. Over the last 4 days she has had multiple bouts of non-bloody diarrhea and bilateral lower quadrant abdominal pain/cramping. She reported these episodes seemed exactly the same in consistency and smell as during her previous C.Diff infection. She also reports generalized fatigue. She denies fever/chills, urinary symptoms, shortness of breath, productive cough, chest pain, dizziness, body aches, falls, headache or blurred vision.   ED course: Upon arrival patient met SIRS criteria with tachycardia and hypotension. Labs significant for leukocytosis without lactic acidosis, mild hyponatremia & hypoalbuminemia. Cr elevated but does appear to be at baseline since Dec 2023. Patient also COVID-19 + again, asymptomatic. Imaging revealed diffuse pan colitis. Patient fluid resuscitated but remains hypotensive, EDP initiated levophed drip and PCCM consulted for admission. Medications given: Tylenol, zofran, flagyl, 2,250 mL IVF bolus, IV contrast Initial Vitals: 99.4, 12, 117, 90/66 & 97% on RA Significant labs: (Labs/ Imaging personally reviewed) I, Cheryll Cockayne Rust-Chester, AGACNP-BC, personally viewed and interpreted  this ECG. EKG Interpretation: Date: 12/27/22, EKG Time: 21:35, Rate: 117, Rhythm: ST, QRS Axis:  borderline RAD, Intervals: normal, ST/T Wave abnormalities: none, Narrative Interpretation: ST Chemistry: Na+: 131, K+: 3.7, BUN/Cr.: 23/ 1.35, Serum CO2/ AG: 22/ 9, albumin: 2.5 Hematology: WBC: 29, Hgb: 12.8,  Lactic/ PCT: 1.3/ pending,  COVID-19 & Influenza A/B: COVID +   CT abdomen/ pelvis with contrast 12/28/22: Infectious/inflammatory pancolitis. No drainable fluid collection/abscess. No free air.  PCCM consulted for admission due to suspected sepsis with circulatory shock secondary to colitis requiring peripheral vasopressor support.  Pertinent  Medical History  HTN Cervical cancer Rheumatoid Arthritis  C.Difficile infection Former smoker- remote CKD Stage 3b  Significant Hospital Events: Including procedures, antibiotic start and stop dates in addition to other pertinent events   12/28/22: Admit to ICU with suspected sepsis & circulatory shock secondary to colitis requiring peripheral vasopressor support. 12/29/22- patient is clinically improved TR[facility] attending taking over.  PCCM will sign off and are available if needed.   Interim History / Subjective:  Patient alert and oriented, marginal BP without any current complaints. Plan of care discussed all questions and concerns answered at this time  Objective   Blood pressure (!) 86/50, pulse 93, temperature 98 F (36.7 C), temperature source Oral, resp. rate 17, height 5\' 4"  (1.626 m), weight 71 kg, SpO2 98 %.        Intake/Output Summary (Last 24 hours) at 12/29/2022 0851 Last data filed at 12/29/2022 0600 Gross per 24 hour  Intake 1507.17 ml  Output 400 ml  Net 1107.17 ml    Filed Weights   12/27/22 2346 12/28/22 0757 12/29/22 0500  Weight: 67.6 kg 67.6 kg 71 kg    Examination: General: Adult female, acutely ill, lying in bed,  NAD HEENT: MM pale/dry, anicteric, atraumatic, neck supple Neuro: A&O x 4, able to follow  commands, PERRL +3, MAE CV: s1s2 RRR, ST on monitor, no r/m/g Pulm: Regular, non labored on RA , breath sounds clear-BUL & clear/diminished-BLL GI: soft, rounded, mild tenderness in bilateral lower quadrants, bs x 4 Skin: limited exam- no rashes/lesions noted Extremities: warm/dry, pulses + 2 R/P, trace edema noted bilateral feet  Resolved Hospital Problem list     Assessment & Plan:  Suspected Sepsis with circulatory shock secondary to pancolitis- RESOLVED  Possible Recurrent C.Diff infection CT imaging revealed pan colitis, recent history of C.diff. Patient also has history of RA Initial interventions/workup included: 2,250 mL of NS/LR & Metronidazole - GI panel & C.Diff panel ordered - If C.diff consider Fidaxomicin, if infectious w/u negative may need GI evaluation - Supplemental oxygen as needed, to maintain SpO2 > 90% - f/u cultures, trend lactic/ PCT - Daily CBC, monitor WBC/ fever curve - IV antibiotics: flagyl - IVF hydration as needed: NS @ 100 mL/h - Consider vasopressors to maintain MAP< 65: norepinephrine  CKD Stage 3b Mild Hyponatremia - IVF support as above - Strict I/O's: alert provider if UOP < 0.5 mL/kg/hr - Daily BMP, replace electrolytes PRN - Avoid nephrotoxic agents as able, ensure adequate renal perfusion  Rheumatoid Arthritis - holding diclofenac due to pancolitis - continue outpatient oxycodone  Hypertension- history - lopressor discontinued last admit due to marginal BP, continue to hold anti hypertensive medication  COVID-19 infection Appears incidental finding. Patient recently positive in March 2024. Asymptomatic. - monitor  Best Practice (right click and "Reselect all SmartList Selections" daily)  Diet/type: NPO w/ oral meds DVT prophylaxis: LMWH GI prophylaxis: PPI Lines: N/A Foley:  N/A Code Status:  full code Last date of multidisciplinary goals of care discussion [12/28/22]  Labs   CBC: Recent Labs  Lab 12/27/22 2140  12/28/22 0613 12/29/22 0410  WBC 29.0* 23.9* 19.1*  NEUTROABS 26.3*  --   --   HGB 12.8 10.7* 9.5*  HCT 40.7 33.3* 30.5*  MCV 88.1 87.4 89.2  PLT 468* 379 341     Basic Metabolic Panel: Recent Labs  Lab 12/27/22 2140 12/28/22 0613 12/29/22 0410  NA 131* 133* 135  K 3.7 4.2 3.1*  CL 100 105 110  CO2 22 18* 17*  GLUCOSE 119* 95 102*  BUN CREATININE 1.35* 1.14* 0.99  CALCIUM 8.0* 7.3* 7.4*  MG  --  1.7 2.4  PHOS  --  2.8 2.4*    GFR: Estimated Creatinine Clearance: 51.1 mL/min (by C-G formula based on SCr of 0.99 mg/dL). Recent Labs  Lab 12/27/22 2140 12/28/22 0613 12/29/22 0410  PROCALCITON  --  0.58  --   WBC 29.0* 23.9* 19.1*  LATICACIDVEN 1.3 0.8  --      Liver Function Tests: Recent Labs  Lab 12/27/22 2140 12/28/22 0613  AST 15 15  ALT 7 8  ALKPHOS 114 94  BILITOT 0.8 1.1  PROT 6.5 5.6*  ALBUMIN 2.5* 2.1*    No results for input(s): "LIPASE", "AMYLASE" in the last 168 hours. No results for input(s): "AMMONIA" in the last 168 hours.  ABG No results found for: "PHART", "PCO2ART", "PO2ART", "HCO3", "TCO2", "ACIDBASEDEF", "O2SAT"   Coagulation Profile: No results for input(s): "INR", "PROTIME" in the last 168 hours.  Cardiac Enzymes: No results for input(s): "CKTOTAL", "CKMB", "CKMBINDEX", "TROPONINI" in the last 168 hours.  HbA1C: No results found for: "HGBA1C"  CBG: Recent Labs  Lab 12/28/22  1539 12/28/22 1928 12/28/22 2335 12/29/22 0346 12/29/22 0750  GLUCAP 110* 99 100* 90 95    Review of Systems: Positives in BOLD  Gen: Denies fever, chills, weight change, fatigue, night sweats HEENT: Denies blurred vision, double vision, hearing loss, tinnitus, sinus congestion, rhinorrhea, sore throat, neck stiffness, dysphagia PULM: Denies shortness of breath, cough, sputum production, hemoptysis, wheezing CV: Denies chest pain, edema, orthopnea, paroxysmal nocturnal dyspnea, palpitations GI: Denies abdominal pain, nausea,  vomiting, diarrhea, hematochezia, melena, constipation, change in bowel habits GU: Denies dysuria, hematuria, polyuria, oliguria, urethral discharge Endocrine: Denies hot or cold intolerance, polyuria, polyphagia or appetite change Derm: Denies rash, dry skin, scaling or peeling skin change Heme: Denies easy bruising, bleeding, bleeding gums Neuro: Denies headache, numbness, weakness, slurred speech, loss of memory or consciousness  Past Medical History:  She,  has a past medical history of Arthritis, Cervical cancer, CKD stage 3b, GFR 30-44 ml/min, Complication of anesthesia, History of kidney stones, Hypertension, and PONV (postoperative nausea and vomiting).   Surgical History:   Past Surgical History:  Procedure Laterality Date   ABDOMINAL HYSTERECTOMY  09/10/2003   CYSTOGRAM N/A 08/04/2018   Procedure: CYSTOGRAM WITH URETHAL DILATION;  Surgeon: Vanna Scotland, MD;  Location: [facility] ORS;  Service: Urology;  Laterality: N/A;   CYSTOSCOPY W/ RETROGRADES Bilateral 08/04/2018   Procedure: CYSTOSCOPY WITH RETROGRADE PYELOGRAM;  Surgeon: Vanna Scotland, MD;  Location: [facility] OR[facility];  Service: Urology;  Laterality: Bilateral;   CYSTOSCOPY/URETEROSCOPY/HOLMIUM LASER/STENT PLACEMENT Left 08/04/2018   Procedure: CYSTOSCOPY/URETEROSCOPY/HOLMIUM LASER/STENT PLACEMENT;  Surgeon: Vanna Scotland, MD;  Location: [facility] ORS;  Service: Urology;  Laterality: Left;   JOINT REPLACEMENT     TOTAL HIP ARTHROPLASTY Right 1972   TOTAL HIP ARTHROPLASTY Left 1981   TOTAL KNEE ARTHROPLASTY Right 1990     Social History:   reports that she quit smoking about 33 years ago. Her smoking use included cigarettes. She has never used smokeless tobacco. She reports that she does not drink alcohol and does not use drugs.   Family History:  Her family history includes Breast cancer in her mother; Cancer in her father; Hypertension in her father. There is no history of Prostate cancer, Kidney cancer, or Bladder Cancer.    Allergies Allergies  Allergen Reactions   Codeine Nausea Only and Nausea And Vomiting   Augmentin [Amoxicillin-Pot Clavulanate] Nausea Only    Very nauseated/ feels sore on body      Home Medications  Prior to Admission medications   Medication Sig Start Date End Date Taking? Authorizing Provider  diclofenac (VOLTAREN) 75 MG EC tablet Take 1 tablet (75 mg total) by mouth 2 (two) times daily. 10/14/20  Yes Margaretann Loveless, PA-C  gabapentin (NEURONTIN) 100 MG capsule Take 100 mg by mouth at bedtime. 10/16/22 10/16/23 Yes [provider]  lactobacillus acidophilus (BACID) TABS tablet Take 1 tablet by mouth daily.   Yes [provider]  lidocaine-prilocaine (EMLA) cream APPLY SPARINGLY 2-3 TIMES A DAY TO AREA OF RADIATION THERAPY IRRITATION. 05/11/20  Yes Bacigalupo, Marzella Schlein, MD  oxyCODONE (OXY IR/ROXICODONE) 5 MG immediate release tablet Take 5-10 mg by mouth every 4 (four) hours as needed. 10/11/21  Yes [provider]  promethazine (PHENERGAN) 25 MG tablet TAKE 0.5 TABLETS (12.5 MG TOTAL) BY MOUTH EVERY 6 (SIX) HOURS AS NEEDED FOR NAUSEA OR VOMITING. 04/06/21  Yes Chrismon, Jodell Cipro, PA-C  tretinoin (RETIN-A) 0.025 % cream Apply topically at bedtime.   Yes [provider]  metoprolol tartrate (LOPRESSOR) 25 MG tablet Hold until  outpatient followup due to low blood pressure. Patient not taking: Reported on 12/27/2022 11/12/22   Darlin Priestly, MD  ondansetron (ZOFRAN-ODT) 8 MG disintegrating tablet Take 1 tablet (8 mg total) by mouth every 8 (eight) hours as needed for nausea or vomiting. Patient not taking: Reported on 12/27/2022 08/28/22   Merwyn Katos, MD      Vida Rigger, M.D.  Pulmonary & Critical Care Medicine  [facility]

## 2022-12-29 NOTE — Consult Note (Signed)
PHARMACY CONSULT NOTE  Pharmacy Consult for Electrolyte Monitoring and Replacement   Recent Labs: Potassium (mmol/L)  Date Value  12/29/2022 3.1 (L)   Magnesium (mg/dL)  Date Value  16/06/9603 2.4   Calcium (mg/dL)  Date Value  54/05/8118 7.4 (L)   Albumin (g/dL)  Date Value  14/78/2956 2.1 (L)  10/02/2019 3.9   Phosphorus (mg/dL)  Date Value  21/30/8657 2.4 (L)   Sodium (mmol/L)  Date Value  12/29/2022 135  10/02/2019 146 (H)   Assessment: 72 y/o F with medical history including HTN, cervical cancer, RA, C diff infection, former smoker, CKD stage 3b admitted with circulatory shock secondary to colitis. Patient tested positive for COVID-19 infection, recently positive in 11/2022. Pharmacy consulted to assist with electrolyte monitoring and replacement as indicated.  MIVF: NS at 125 cc/hr  Goal of Therapy:  Electrolytes within normal limits  Plan:  --40 mEq po KCl x 2 --Follow-up electrolytes with AM labs tomorrow  Lowella Bandy 12/29/2022 7:11 AM

## 2022-12-29 NOTE — Progress Notes (Signed)
PROGRESS NOTE    Jeanne [name]  ZOX:[id number] DOB: 02/06/1951 DOA: 12/27/2022 PCP: Jerl Mina, MD    Brief Narrative:  72 yo F presenting to [facility] ED from home for evaluation of diarrhea.   Of note the patient was recently hospitalized with C.Diff from 11/09/22-11/12/22, discharged with PO vancomycin. She states she completed this antibiotic course as prescribed. She reported that she did have resolution of her symptoms for a few weeks. She also had been COVID 19 + during this admission, but was asymptomatic and did not require treatment. She reports that over the past week she has been nauseous and had poor PO intake, but denied vomiting. Over the last 4 days she has had multiple bouts of non-bloody diarrhea and bilateral lower quadrant abdominal pain/cramping. She reported these episodes seemed exactly the same in consistency and smell as during her previous C.Diff infection. She also reports generalized fatigue. She denies fever/chills, urinary symptoms, shortness of breath, productive cough, chest pain, dizziness, body aches, falls, headache or blurred vision.    Assessment & Plan:   Principal Problem:   Pancolitis Active Problems:   COVID-19 virus infection   History of Clostridioides difficile colitis  Pancolitis Diarrhea History of C. difficile infection Unclear nature of her current presentation.  Patient has CT evidence of pancolitis.  She does have a history of C. difficile.  Interestingly patient reports voluminous diarrhea prior to admission however states that once she was admitted the diarrhea stopped.  Infectious disease consulted.  They indicated that patient has a current positive C. difficile antigen and toxin however the assay was from 11/09/2022.  Despite this considering the clinical scenario we will treat his active C. difficile colitis. Plan: Continue to attempt to acquire C. difficile and GI PCR panel Continue p.o. vancomycin 125 mg every 6 hours IV  fluids Midodrine 5 mg 3 times daily Transfer to progressive unit  Circulatory shock Improved.  Patient has been fluid responsive despite borderline low blood pressures.  Lactic acid normal.  Patient mentating well.  Not tachycardic. Plan: Treatment as above  Essential hypertension Patient was previously on 2 blood pressure medications but had been taken off of both of them.  Currently blood pressure is borderline.  Rheumatoid arthritis No acute issues Nightly gabapentin  AKI Resolved IV fluids as above  Hypokalemia Monitor and replace  COVID 19 infection Appears asymptomatic   DVT prophylaxis: SQ Lovenox Code Status: Full Family Communication: None Disposition Plan: Status is: Inpatient Remains inpatient appropriate because: Pancolitis.  Suspected C. difficile infection.   Level of care: Progressive  Consultants:  ID  Procedures:  None  Antimicrobials: Oral vancomycin   Subjective: Seen and examined.  Resting in bed.  No visible distress.  Answers all questions appropriately.  Objective: Vitals:   12/29/22 1100 12/29/22 1128 12/29/22 1130 12/29/22 1137  BP: (!) 96/43 (!) 91/52    Pulse: 95 95 95   Resp: 20 14 13    Temp:    (!) 97.5 F (36.4 C)  TempSrc:    Axillary  SpO2: 97% 98% 99%   Weight:      Height:        Intake/Output Summary (Last 24 hours) at 12/29/2022 1235 Last data filed at 12/29/2022 1200 Gross per 24 hour  Intake 1779.04 ml  Output 600 ml  Net 1179.04 ml   Filed Weights   12/27/22 2346 12/28/22 0757 12/29/22 0500  Weight: 67.6 kg 67.6 kg 71 kg    Examination:  General exam: Appears calm and  comfortable  Respiratory system: Clear to auscultation. Respiratory effort normal. Cardiovascular system: S2, RRR, no murmurs, no pedal edema Gastrointestinal system: Soft, NT/ND, normal bowel sounds Central nervous system: Alert and oriented. No focal neurological deficits. Extremities: Symmetric 5 x 5 power. Skin: No rashes, lesions  or ulcers Psychiatry: Judgement and insight appear normal. Mood & affect appropriate.     Data Reviewed: I have personally reviewed following labs and imaging studies  CBC: Recent Labs  Lab 12/27/22 2140 12/28/22 0613 12/29/22 0410  WBC 29.0* 23.9* 19.1*  NEUTROABS 26.3*  --   --   HGB 12.8 10.7* 9.5*  HCT 40.7 33.3* 30.5*  MCV 88.1 87.4 89.2  PLT 468* 379 341   Basic Metabolic Panel: Recent Labs  Lab 12/27/22 2140 12/28/22 0613 12/29/22 0410  NA 131* 133* 135  K 3.7 4.2 3.1*  CL 100 105 110  CO2 22 18* 17*  GLUCOSE 119* 95 102*  BUN 23 21 16   CREATININE 1.35* 1.14* 0.99  CALCIUM 8.0* 7.3* 7.4*  MG  --  1.7 2.4  PHOS  --  2.8 2.4*   GFR: Estimated Creatinine Clearance: 51.1 mL/min (by C-G formula based on SCr of 0.99 mg/dL). Liver Function Tests: Recent Labs  Lab 12/27/22 2140 12/28/22 0613  AST 15 15  ALT 7 8  ALKPHOS 114 94  BILITOT 0.8 1.1  PROT 6.5 5.6*  ALBUMIN 2.5* 2.1*   No results for input(s): "LIPASE", "AMYLASE" in the last 168 hours. No results for input(s): "AMMONIA" in the last 168 hours. Coagulation Profile: No results for input(s): "INR", "PROTIME" in the last 168 hours. Cardiac Enzymes: No results for input(s): "CKTOTAL", "CKMB", "CKMBINDEX", "TROPONINI" in the last 168 hours. BNP (last 3 results) No results for input(s): "PROBNP" in the last 8760 hours. HbA1C: No results for input(s): "HGBA1C" in the last 72 hours. CBG: Recent Labs  Lab 12/28/22 1928 12/28/22 2335 12/29/22 0346 12/29/22 0750 12/29/22 1126  GLUCAP 99 100* 90 95 139*   Lipid Profile: No results for input(s): "CHOL", "HDL", "LDLCALC", "TRIG", "CHOLHDL", "LDLDIRECT" in the last 72 hours. Thyroid Function Tests: No results for input(s): "TSH", "T4TOTAL", "FREET4", "T3FREE", "THYROIDAB" in the last 72 hours. Anemia Panel: Recent Labs    12/29/22 0410  VITAMINB12 287  FOLATE 9.3   Sepsis Labs: Recent Labs  Lab 12/27/22 2140 12/28/22 0613  PROCALCITON  --   0.58  LATICACIDVEN 1.3 0.8    Recent Results (from the past 240 hour(s))  Resp panel by RT-PCR (RSV, Flu A&B, Covid) Anterior Nasal Swab     Status: Abnormal   Collection Time: 12/28/22 12:20 AM   Specimen: Anterior Nasal Swab  Result Value Ref Range Status   SARS Coronavirus 2 by RT PCR POSITIVE (A) NEGATIVE Final    Comment: (NOTE) SARS-CoV-2 target nucleic acids are DETECTED.  The SARS-CoV-2 RNA is generally detectable in upper respiratory specimens during the acute phase of infection. Positive results are indicative of the presence of the identified virus, but do not rule out bacterial infection or co-infection with other pathogens not detected by the test. Clinical correlation with patient history and other diagnostic information is necessary to determine patient infection status. The expected result is Negative.  Fact Sheet for Patients: BloggerCourse.com  Fact Sheet for Healthcare Providers: SeriousBroker.it  This test is not yet approved or cleared by the [place] FDA and  has been authorized for detection and/or diagnosis of SARS-CoV-2 by FDA under an Emergency Use Authorization (EUA).  This EUA will remain  in effect (meaning this test can be used) for the duration of  the COVID-19 declaration under Section 564(b)(1) of the A ct, 21 U.S.C. section 360bbb-3(b)(1), unless the authorization is terminated or revoked sooner.     Influenza A by PCR NEGATIVE NEGATIVE Final   Influenza B by PCR NEGATIVE NEGATIVE Final    Comment: (NOTE) The Xpert Xpress SARS-CoV-2/FLU/RSV plus assay is intended as an aid in the diagnosis of influenza from Nasopharyngeal swab specimens and should not be used as a sole basis for treatment. Nasal washings and aspirates are unacceptable for Xpert Xpress SARS-CoV-2/FLU/RSV testing.  Fact Sheet for Patients: BloggerCourse.com  Fact Sheet for Healthcare  Providers: SeriousBroker.it  This test is not yet approved or cleared by the [place] FDA and has been authorized for detection and/or diagnosis of SARS-CoV-2 by FDA under an Emergency Use Authorization (EUA). This EUA will remain in effect (meaning this test can be used) for the duration of the COVID-19 declaration under Section 564(b)(1) of the Act, 21 U.S.C. section 360bbb-3(b)(1), unless the authorization is terminated or revoked.     Resp Syncytial Virus by PCR NEGATIVE NEGATIVE Final    Comment: (NOTE) Fact Sheet for Patients: BloggerCourse.com  Fact Sheet for Healthcare Providers: SeriousBroker.it  This test is not yet approved or cleared by the [place] FDA and has been authorized for detection and/or diagnosis of SARS-CoV-2 by FDA under an Emergency Use Authorization (EUA). This EUA will remain in effect (meaning this test can be used) for the duration of the COVID-19 declaration under Section 564(b)(1) of the Act, 21 U.S.C. section 360bbb-3(b)(1), unless the authorization is terminated or revoked.  Performed at [facility], [address] [place], [place] [address]   Blood Culture (routine x 2)     Status: None (Preliminary result)   Collection Time: 12/28/22 12:20 AM   Specimen: BLOOD  Result Value Ref Range Status   Specimen Description BLOOD  LEFT AC  Final   Special Requests   Final    BOTTLES DRAWN AEROBIC AND ANAEROBIC Blood Culture adequate volume   Culture  Setup Time   Final    GRAM POSITIVE COCCI ANAEROBIC BOTTLE ONLY Organism ID to follow CRITICAL RESULT CALLED TO, READ BACK BY AND VERIFIED WITH: NATHAN BELUE @ 2204 12/28/22 LFD Performed at [facility], [address] [place], [place] [address]    Culture GRAM POSITIVE COCCI  Final   Report Status PENDING  Incomplete  Blood Culture ID Panel (Reflexed)     Status: Abnormal   Collection Time:  12/28/22 12:20 AM  Result Value Ref Range Status   Enterococcus faecalis NOT DETECTED NOT DETECTED Final   Enterococcus Faecium NOT DETECTED NOT DETECTED Final   Listeria monocytogenes NOT DETECTED NOT DETECTED Final   Staphylococcus species DETECTED (A) NOT DETECTED Final    Comment: CRITICAL RESULT CALLED TO, READ BACK BY AND VERIFIED WITH: NATHAN BELUE @ 2204 12/28/22 LFD    Staphylococcus aureus (BCID) NOT DETECTED NOT DETECTED Final   Staphylococcus epidermidis DETECTED (A) NOT DETECTED Final    Comment: Methicillin (oxacillin) resistant coagulase negative staphylococcus. Possible blood culture contaminant (unless isolated from more than one blood culture draw or clinical case suggests pathogenicity). No antibiotic treatment is indicated for blood  culture contaminants. CRITICAL RESULT CALLED TO, READ BACK BY AND VERIFIED WITH: NATHAN BELUE @ 2204 12/28/22 LFD    Staphylococcus lugdunensis NOT DETECTED NOT DETECTED Final   Streptococcus species NOT DETECTED NOT DETECTED Final   Streptococcus agalactiae  NOT DETECTED NOT DETECTED Final   Streptococcus pneumoniae NOT DETECTED NOT DETECTED Final   Streptococcus pyogenes NOT DETECTED NOT DETECTED Final   A.calcoaceticus-baumannii NOT DETECTED NOT DETECTED Final   Bacteroides fragilis NOT DETECTED NOT DETECTED Final   Enterobacterales NOT DETECTED NOT DETECTED Final   Enterobacter cloacae complex NOT DETECTED NOT DETECTED Final   Escherichia coli NOT DETECTED NOT DETECTED Final   Klebsiella aerogenes NOT DETECTED NOT DETECTED Final   Klebsiella oxytoca NOT DETECTED NOT DETECTED Final   Klebsiella pneumoniae NOT DETECTED NOT DETECTED Final   Proteus species NOT DETECTED NOT DETECTED Final   Salmonella species NOT DETECTED NOT DETECTED Final   Serratia marcescens NOT DETECTED NOT DETECTED Final   Haemophilus influenzae NOT DETECTED NOT DETECTED Final   Neisseria meningitidis NOT DETECTED NOT DETECTED Final   Pseudomonas aeruginosa NOT  DETECTED NOT DETECTED Final   Stenotrophomonas maltophilia NOT DETECTED NOT DETECTED Final   Candida albicans NOT DETECTED NOT DETECTED Final   Candida auris NOT DETECTED NOT DETECTED Final   Candida glabrata NOT DETECTED NOT DETECTED Final   Candida krusei NOT DETECTED NOT DETECTED Final   Candida parapsilosis NOT DETECTED NOT DETECTED Final   Candida tropicalis NOT DETECTED NOT DETECTED Final   Cryptococcus neoformans/gattii NOT DETECTED NOT DETECTED Final   Methicillin resistance mecA/C DETECTED (A) NOT DETECTED Final    Comment: CRITICAL RESULT CALLED TO, READ BACK BY AND VERIFIED WITH: NATHAN BELUE @ 2204 12/28/22 LFD Performed at [facility], [address] [place], [place] [address]   Blood Culture (routine x 2)     Status: None (Preliminary result)   Collection Time: 12/28/22  1:06 AM   Specimen: BLOOD  Result Value Ref Range Status   Specimen Description BLOOD RIGHT FOREARM  Final   Special Requests   Final    BOTTLES DRAWN AEROBIC AND ANAEROBIC Blood Culture results may not be optimal due to an inadequate volume of blood received in culture bottles   Culture   Final    NO GROWTH 1 DAY Performed at [facility], [address] [place], [place] [address]    Report Status PENDING  Incomplete         Radiology Studies: CT ABDOMEN PELVIS W CONTRAST  Result Date: 12/28/2022 CLINICAL DATA:  Diarrhea, abdominal pain EXAM: CT ABDOMEN AND PELVIS WITH CONTRAST TECHNIQUE: Multidetector CT imaging of the abdomen and pelvis was performed using the standard protocol following bolus administration of intravenous contrast. RADIATION DOSE REDUCTION: This exam was performed according to the departmental dose-optimization program which includes automated exposure control, adjustment of the mA and/or kV according to patient size and/or use of iterative reconstruction technique. CONTRAST:  75mL OMNIPAQUE IOHEXOL 300 MG/ML  SOLN COMPARISON:  07/28/2018 FINDINGS: Lower  chest: Lung bases are clear. Hepatobiliary: Liver is within normal limits. Layering small gallstones (series 2/image 19), without associated inflammatory changes. No intrahepatic or extrahepatic duct dilatation. Pancreas: Within normal limits Spleen: At the upper limits of normal for size put Adrenals/Urinary Tract: Adrenal glands are within normal limits. Right renal cortical scarring. Nonobstructing renal calculi measuring up to 2-3 mm. Renal sinus cysts without frank hydronephrosis. Severe left renal atrophy, progressive. Moderate left hydroureteronephrosis, chronic. No convincing ureteral calculus. Bladder is grossly unremarkable though partially obscured by streak artifact. Stomach/Bowel: Stomach is within normal limits. No evidence of bowel obstruction. Appendix is not discretely visualized. Wall thickening/inflammatory changes extending from the cecum to the rectum, suggesting infectious/inflammatory pancolitis. No pneumatosis. No drainable fluid collection/abscess.  No  free air. Vascular/Lymphatic: No evidence of abdominal aortic aneurysm. No suspicious abdominopelvic lymphadenopathy. Reproductive: Status post hysterectomy. No adnexal masses. Other: No abdominopelvic ascites. Musculoskeletal: Status post right hip arthroplasty with revision from the prior. An acetabular screw extends into the right lower pelvis (series 2/image 53). Left hip arthroplasty. Degenerative changes of the thoracolumbar spine with stable moderate compression fracture deformity at T12, stable mild inferior endplate changes at L1, and new moderate central compression fracture deformity at L5 (although likely chronic). IMPRESSION: Infectious/inflammatory pancolitis. No drainable fluid collection/abscess. No free air. Additional ancillary findings as above. Electronically Signed   By: Charline Bills M.D.   On: 12/28/2022 00:12        Scheduled Meds:  vitamin C  500 mg Oral BID   Chlorhexidine Gluconate Cloth  6 each Topical  Daily   enoxaparin (LOVENOX) injection  40 mg Subcutaneous Q24H   feeding supplement  1 Container Oral TID BM   midodrine  5 mg Oral TID WC   multivitamin with minerals  1 tablet Oral Daily   pantoprazole (PROTONIX) IV  40 mg Intravenous Q24H   potassium chloride  40 mEq Oral TID   vancomycin  125 mg Oral Q6H   Continuous Infusions:  sodium chloride     sodium chloride 125 mL/hr at 12/29/22 1200     LOS: 1 day    Tresa Moore, MD [facility]   If 7PM-7AM, please contact night-coverage  12/29/2022, 12:35 PM

## 2022-12-30 ENCOUNTER — Encounter: Payer: Self-pay | Admitting: Pulmonary Disease

## 2022-12-30 DIAGNOSIS — K5100 Ulcerative (chronic) pancolitis without complications: Secondary | ICD-10-CM | POA: Diagnosis not present

## 2022-12-30 LAB — GLUCOSE, CAPILLARY
Glucose-Capillary: 106 mg/dL — ABNORMAL HIGH (ref 70–99)
Glucose-Capillary: 123 mg/dL — ABNORMAL HIGH (ref 70–99)
Glucose-Capillary: 133 mg/dL — ABNORMAL HIGH (ref 70–99)
Glucose-Capillary: 93 mg/dL (ref 70–99)

## 2022-12-30 LAB — BASIC METABOLIC PANEL
Anion gap: 5 (ref 5–15)
BUN: 12 mg/dL (ref 8–23)
CO2: 16 mmol/L — ABNORMAL LOW (ref 22–32)
Calcium: 7.3 mg/dL — ABNORMAL LOW (ref 8.9–10.3)
Chloride: 116 mmol/L — ABNORMAL HIGH (ref 98–111)
Creatinine, Ser: 0.88 mg/dL (ref 0.44–1.00)
GFR, Estimated: >60 mL/min (ref >60)
Glucose, Bld: 110 mg/dL — ABNORMAL HIGH (ref 70–99)
Potassium: 4.1 mmol/L (ref 3.5–5.1)
Sodium: 137 mmol/L (ref 135–145)

## 2022-12-30 LAB — CBC
HCT: 29.4 % — ABNORMAL LOW (ref 36.0–46.0)
Hemoglobin: 9.1 g/dL — ABNORMAL LOW (ref 12.0–15.0)
MCH: 27.5 pg (ref 26.0–34.0)
MCHC: 31 g/dL (ref 30.0–36.0)
MCV: 88.8 fL (ref 80.0–100.0)
Platelets: 342 10*3/uL (ref 150–400)
RBC: 3.31 MIL/uL — ABNORMAL LOW (ref 3.87–5.11)
RDW: 13.8 % (ref 11.5–15.5)
WBC: 19.2 10*3/uL — ABNORMAL HIGH (ref 4.0–10.5)
nRBC: 0 % (ref 0.0–0.2)

## 2022-12-30 LAB — MAGNESIUM: Magnesium: 2 mg/dL (ref 1.7–2.4)

## 2022-12-30 LAB — CULTURE, BLOOD (ROUTINE X 2)
Culture: NO GROWTH
Special Requests: ADEQUATE

## 2022-12-30 LAB — PHOSPHORUS: Phosphorus: 1.9 mg/dL — ABNORMAL LOW (ref 2.5–4.6)

## 2022-12-30 LAB — LACTIC ACID, PLASMA: Lactic Acid, Venous: 0.9 mmol/L (ref 0.5–1.9)

## 2022-12-30 MED ORDER — GERHARDT'S BUTT CREAM
TOPICAL_CREAM | Freq: Every day | CUTANEOUS | Status: DC
  Filled 2022-12-30 (×4): qty 1

## 2022-12-30 MED ORDER — MIDODRINE HCL 5 MG PO TABS
10.0000 mg | ORAL_TABLET | Freq: Three times a day (TID) | ORAL | Status: DC
  Administered 2022-12-30 – 2023-01-04 (×16): 10 mg via ORAL
  Filled 2022-12-30 (×16): qty 2

## 2022-12-30 MED ORDER — ALBUMIN HUMAN 25 % IV SOLN
12.5000 g | Freq: Once | INTRAVENOUS | Status: AC
  Administered 2022-12-30: 12.5 g via INTRAVENOUS
  Filled 2022-12-30: qty 50

## 2022-12-30 MED ORDER — K PHOS MONO-SOD PHOS DI & MONO 155-852-130 MG PO TABS
500.0000 mg | ORAL_TABLET | Freq: Three times a day (TID) | ORAL | Status: AC
  Administered 2022-12-30 (×2): 500 mg via ORAL
  Filled 2022-12-30 (×2): qty 2

## 2022-12-30 NOTE — Consult Note (Signed)
PHARMACY CONSULT NOTE  Pharmacy Consult for Electrolyte Monitoring and Replacement   Recent Labs: Potassium (mmol/L)  Date Value  12/30/2022 4.1   Magnesium (mg/dL)  Date Value  16/06/9603 2.0   Calcium (mg/dL)  Date Value  54/05/8118 7.3 (L)   Albumin (g/dL)  Date Value  14/78/2956 2.1 (L)  10/02/2019 3.9   Phosphorus (mg/dL)  Date Value  21/30/8657 1.9 (L)   Sodium (mmol/L)  Date Value  12/30/2022 137  10/02/2019 146 (H)   Assessment: 72 y/o F with medical history including HTN, cervical cancer, RA, C diff infection, former smoker, CKD stage 3b admitted with circulatory shock secondary to colitis. Patient tested positive for COVID-19 infection, recently positive in 11/2022. Pharmacy consulted to assist with electrolyte monitoring and replacement as indicated.  MIVF: NS at 125 cc/hr  Goal of Therapy:  Electrolytes within normal limits  Plan:  --500 mg po Kphos Neutral (each dose contains phosphorus 16 mMol, potassium 2.2 mEq) x 2 --Follow-up electrolytes with AM labs tomorrow  Lowella Bandy 12/30/2022 7:12 AM

## 2022-12-30 NOTE — Progress Notes (Signed)
       CROSS COVER NOTE  NAME: JAMEYAH FENNEWALD MRN: [id number] DOB : 1950/11/17 ATTENDING PHYSICIAN: Tresa Moore, MD    Date of Service   12/30/2022   HPI/Events of Note   Report/Request Message received from RN reporting hypotension, BP 73/40. Pt is alert and on review of chart has had adequate urine output today  RN also reports ongoing diarrhea with stool incontinence and associated skin breakdown despite utilizing the "purple protective" cream and Gerhardts butt cream   On Review of chart BP has been soft throughout the day, patient has been asymptomatic and BP goal is MAP >55   Interventions   Assessment/Plan: Albumin + Fluid bolus Insert Rectal tube         To reach the provider On-Call:   7AM- 7PM see care teams to locate the attending and reach out to them via [email or web address] Password: [email or web address] 7PM-7AM contact night-coverage If you still have difficulty reaching the appropriate provider, please page the [facility] (Director on Call) for [facility] on amion for assistance  This document was prepared using Conservation officer, historic buildings and may include unintentional dictation errors.  Bishop Limbo DNP, MBA, FNP-BC, PMHNP-BC Nurse Practitioner [facility] Pager [phone]

## 2022-12-30 NOTE — Progress Notes (Signed)
PROGRESS NOTE    JOHNANNA [name]  ZOX:[id number] DOB: 08/13/1951 DOA: 12/27/2022 PCP: Jerl Mina, MD    Brief Narrative:  72 yo F presenting to [facility] ED from home for evaluation of diarrhea.   Of note the patient was recently hospitalized with C.Diff from 11/09/22-11/12/22, discharged with PO vancomycin. She states she completed this antibiotic course as prescribed. She reported that she did have resolution of her symptoms for a few weeks. She also had been COVID 19 + during this admission, but was asymptomatic and did not require treatment. She reports that over the past week she has been nauseous and had poor PO intake, but denied vomiting. Over the last 4 days she has had multiple bouts of non-bloody diarrhea and bilateral lower quadrant abdominal pain/cramping. She reported these episodes seemed exactly the same in consistency and smell as during her previous C.Diff infection. She also reports generalized fatigue. She denies fever/chills, urinary symptoms, shortness of breath, productive cough, chest pain, dizziness, body aches, falls, headache or blurred vision.   4/21: Blood pressure remains low but overall asymptomatic.  Lactic acid normal.  Patient not demonstrating shock physiology.  Mentating clearly.  Diarrhea resolving.   Assessment & Plan:   Principal Problem:   Pancolitis Active Problems:   COVID-19 virus infection   History of Clostridioides difficile colitis  Pancolitis Diarrhea History of C. difficile infection Unclear nature of her current presentation.  Patient has CT evidence of pancolitis.  She does have a history of C. difficile.  Interestingly patient reports voluminous diarrhea prior to admission however states that once she was admitted the diarrhea stopped.  Infectious disease consulted.  They indicated that patient has a current positive C. difficile antigen and toxin however the assay was from 11/09/2022.  Despite this considering the clinical scenario we will  treat his active C. difficile colitis. Repeat C. difficile positive antigen and toxin Plan: Continue p.o. vancomycin 125 mg every 6 hours Continue IV fluids Midodrine 10 mg 3 times daily Transfer to progressive unit  Circulatory shock Improved.  Patient has been fluid responsive despite borderline low blood pressures.  Lactic acid normal.  Patient mentating well.  Not tachycardic.  Normal lactic acid.  Not demonstrating shock physiology Plan: Increase dose of midodrine as above Albumin 25%, 12.5 mg bolus x 1  Essential hypertension Patient was previously on 2 blood pressure medications but had been taken off of both of them.  Currently blood pressure is borderline.  MAP goal 55 or greater  Rheumatoid arthritis No acute issues Nightly gabapentin  AKI Resolved IV fluids as above  Hypokalemia Monitor and replace  COVID 19 infection Appears asymptomatic   DVT prophylaxis: SQ Lovenox Code Status: Full Family Communication: None Disposition Plan: Status is: Inpatient Remains inpatient appropriate because: C. difficile colitis   Level of care: Progressive  Consultants:  ID  Procedures:  None  Antimicrobials: Oral vancomycin   Subjective: Seen and appear resting comfortably in bed.  No visible distress.  Starting to feel better.  Objective: Vitals:   12/30/22 0935 12/30/22 1000 12/30/22 1100 12/30/22 1200  BP: (!) 76/56 (!) 86/47 (!) 81/57 (!) 75/45  Pulse: 80 78 77 77  Resp: 13 18 20 19   Temp:      TempSrc:      SpO2: 99% 98% 96% 96%  Weight:      Height:        Intake/Output Summary (Last 24 hours) at 12/30/2022 1218 Last data filed at 12/30/2022 1200 Gross per 24 hour  Intake 3134.11 ml  Output 101 ml  Net 3033.11 ml   Filed Weights   12/28/22 0757 12/29/22 0500 12/30/22 0712  Weight: 67.6 kg 71 kg 71 kg    Examination:  General exam: No acute distress Respiratory system: Lungs clear.  Normal work of breathing.  Room air Cardiovascular  system: S2, RRR, no murmurs, no pedal edema Gastrointestinal system: Soft, NT/ND, normal bowel sounds Central nervous system: Alert and oriented. No focal neurological deficits. Extremities: Symmetric 5 x 5 power. Skin: No rashes, lesions or ulcers Psychiatry: Judgement and insight appear normal. Mood & affect appropriate.     Data Reviewed: I have personally reviewed following labs and imaging studies  CBC: Recent Labs  Lab 12/27/22 2140 12/28/22 0613 12/29/22 0410 12/30/22 0454  WBC 29.0* 23.9* 19.1* 19.2*  NEUTROABS 26.3*  --   --   --   HGB 12.8 10.7* 9.5* 9.1*  HCT 40.7 33.3* 30.5* 29.4*  MCV 88.1 87.4 89.2 88.8  PLT 468* 379 341 342   Basic Metabolic Panel: Recent Labs  Lab 12/27/22 2140 12/28/22 0613 12/29/22 0410 12/30/22 0454  NA 131* 133* 135 137  K 3.7 4.2 3.1* 4.1  CL 100 105 110 116*  CO2 22 18* 17* 16*  GLUCOSE 119* 95 102* 110*  BUN 23 21 16 12   CREATININE 1.35* 1.14* 0.99 0.88  CALCIUM 8.0* 7.3* 7.4* 7.3*  MG  --  1.7 2.4 2.0  PHOS  --  2.8 2.4* 1.9*   GFR: Estimated Creatinine Clearance: 57.5 mL/min (by C-G formula based on SCr of 0.88 mg/dL). Liver Function Tests: Recent Labs  Lab 12/27/22 2140 12/28/22 0613  AST 15 15  ALT 7 8  ALKPHOS 114 94  BILITOT 0.8 1.1  PROT 6.5 5.6*  ALBUMIN 2.5* 2.1*   No results for input(s): "LIPASE", "AMYLASE" in the last 168 hours. No results for input(s): "AMMONIA" in the last 168 hours. Coagulation Profile: No results for input(s): "INR", "PROTIME" in the last 168 hours. Cardiac Enzymes: No results for input(s): "CKTOTAL", "CKMB", "CKMBINDEX", "TROPONINI" in the last 168 hours. BNP (last 3 results) No results for input(s): "PROBNP" in the last 8760 hours. HbA1C: No results for input(s): "HGBA1C" in the last 72 hours. CBG: Recent Labs  Lab 12/29/22 1126 12/29/22 1604 12/29/22 1916 12/30/22 0756 12/30/22 1140  GLUCAP 139* 123* 123* 93 133*   Lipid Profile: No results for input(s): "CHOL",  "HDL", "LDLCALC", "TRIG", "CHOLHDL", "LDLDIRECT" in the last 72 hours. Thyroid Function Tests: No results for input(s): "TSH", "T4TOTAL", "FREET4", "T3FREE", "THYROIDAB" in the last 72 hours. Anemia Panel: Recent Labs    12/29/22 0410  VITAMINB12 287  FOLATE 9.3   Sepsis Labs: Recent Labs  Lab 12/27/22 2140 12/28/22 0613 12/30/22 1000  PROCALCITON  --  0.58  --   LATICACIDVEN 1.3 0.8 0.9    Recent Results (from the past 240 hour(s))  Resp panel by RT-PCR (RSV, Flu A&B, Covid) Anterior Nasal Swab     Status: Abnormal   Collection Time: 12/28/22 12:20 AM   Specimen: Anterior Nasal Swab  Result Value Ref Range Status   SARS Coronavirus 2 by RT PCR POSITIVE (A) NEGATIVE Final    Comment: (NOTE) SARS-CoV-2 target nucleic acids are DETECTED.  The SARS-CoV-2 RNA is generally detectable in upper respiratory specimens during the acute phase of infection. Positive results are indicative of the presence of the identified virus, but do not rule out bacterial infection or co-infection with other pathogens not detected by the test. Clinical  correlation with patient history and other diagnostic information is necessary to determine patient infection status. The expected result is Negative.  Fact Sheet for Patients: BloggerCourse.com  Fact Sheet for Healthcare Providers: SeriousBroker.it  This test is not yet approved or cleared by the [place] FDA and  has been authorized for detection and/or diagnosis of SARS-CoV-2 by FDA under an Emergency Use Authorization (EUA).  This EUA will remain in effect (meaning this test can be used) for the duration of  the COVID-19 declaration under Section 564(b)(1) of the A ct, 21 U.S.C. section 360bbb-3(b)(1), unless the authorization is terminated or revoked sooner.     Influenza A by PCR NEGATIVE NEGATIVE Final   Influenza B by PCR NEGATIVE NEGATIVE Final    Comment: (NOTE) The Xpert  Xpress SARS-CoV-2/FLU/RSV plus assay is intended as an aid in the diagnosis of influenza from Nasopharyngeal swab specimens and should not be used as a sole basis for treatment. Nasal washings and aspirates are unacceptable for Xpert Xpress SARS-CoV-2/FLU/RSV testing.  Fact Sheet for Patients: BloggerCourse.com  Fact Sheet for Healthcare Providers: SeriousBroker.it  This test is not yet approved or cleared by the [place] FDA and has been authorized for detection and/or diagnosis of SARS-CoV-2 by FDA under an Emergency Use Authorization (EUA). This EUA will remain in effect (meaning this test can be used) for the duration of the COVID-19 declaration under Section 564(b)(1) of the Act, 21 U.S.C. section 360bbb-3(b)(1), unless the authorization is terminated or revoked.     Resp Syncytial Virus by PCR NEGATIVE NEGATIVE Final    Comment: (NOTE) Fact Sheet for Patients: BloggerCourse.com  Fact Sheet for Healthcare Providers: SeriousBroker.it  This test is not yet approved or cleared by the [place] FDA and has been authorized for detection and/or diagnosis of SARS-CoV-2 by FDA under an Emergency Use Authorization (EUA). This EUA will remain in effect (meaning this test can be used) for the duration of the COVID-19 declaration under Section 564(b)(1) of the Act, 21 U.S.C. section 360bbb-3(b)(1), unless the authorization is terminated or revoked.  Performed at [facility], [address] [place], [place] [address]   Blood Culture (routine x 2)     Status: Abnormal (Preliminary result)   Collection Time: 12/28/22 12:20 AM   Specimen: BLOOD  Result Value Ref Range Status   Specimen Description   Final    BLOOD  LEFT [facility] Performed at [facility], [address] [place], [place] [address]    Special Requests   Final    BOTTLES DRAWN AEROBIC AND  ANAEROBIC Blood Culture adequate volume Performed at [facility], [address] [place], [place] [address]    Culture  Setup Time   Final    GRAM POSITIVE COCCI ANAEROBIC BOTTLE ONLY Organism ID to follow CRITICAL RESULT CALLED TO, READ BACK BY AND VERIFIED WITH: NATHAN BELUE @ 2204 12/28/22 LFD Performed at [facility], [address] [place], [place] [address]    Culture (A)  Final    STAPHYLOCOCCUS EPIDERMIDIS THE SIGNIFICANCE OF ISOLATING THIS ORGANISM FROM A SINGLE SET OF BLOOD CULTURES WHEN MULTIPLE SETS ARE DRAWN IS UNCERTAIN. PLEASE NOTIFY THE MICROBIOLOGY DEPARTMENT WITHIN ONE WEEK IF SPECIATION AND SENSITIVITIES ARE REQUIRED. Performed at [facility], [address], [place], [place] [address]    Report Status PENDING  Incomplete  Blood Culture ID Panel (Reflexed)     Status: Abnormal   Collection Time: 12/28/22 12:20 AM  Result Value Ref Range Status  Enterococcus faecalis NOT DETECTED NOT DETECTED Final   Enterococcus Faecium NOT DETECTED NOT DETECTED Final   Listeria monocytogenes NOT DETECTED NOT DETECTED Final   Staphylococcus species DETECTED (A) NOT DETECTED Final    Comment: CRITICAL RESULT CALLED TO, READ BACK BY AND VERIFIED WITH: NATHAN BELUE @ 2204 12/28/22 LFD    Staphylococcus aureus (BCID) NOT DETECTED NOT DETECTED Final   Staphylococcus epidermidis DETECTED (A) NOT DETECTED Final    Comment: Methicillin (oxacillin) resistant coagulase negative staphylococcus. Possible blood culture contaminant (unless isolated from more than one blood culture draw or clinical case suggests pathogenicity). No antibiotic treatment is indicated for blood  culture contaminants. CRITICAL RESULT CALLED TO, READ BACK BY AND VERIFIED WITH: NATHAN BELUE @ 2204 12/28/22 LFD    Staphylococcus lugdunensis NOT DETECTED NOT DETECTED Final   Streptococcus species NOT DETECTED NOT DETECTED Final   Streptococcus agalactiae NOT DETECTED NOT DETECTED Final    Streptococcus pneumoniae NOT DETECTED NOT DETECTED Final   Streptococcus pyogenes NOT DETECTED NOT DETECTED Final   A.calcoaceticus-baumannii NOT DETECTED NOT DETECTED Final   Bacteroides fragilis NOT DETECTED NOT DETECTED Final   Enterobacterales NOT DETECTED NOT DETECTED Final   Enterobacter cloacae complex NOT DETECTED NOT DETECTED Final   Escherichia coli NOT DETECTED NOT DETECTED Final   Klebsiella aerogenes NOT DETECTED NOT DETECTED Final   Klebsiella oxytoca NOT DETECTED NOT DETECTED Final   Klebsiella pneumoniae NOT DETECTED NOT DETECTED Final   Proteus species NOT DETECTED NOT DETECTED Final   Salmonella species NOT DETECTED NOT DETECTED Final   Serratia marcescens NOT DETECTED NOT DETECTED Final   Haemophilus influenzae NOT DETECTED NOT DETECTED Final   Neisseria meningitidis NOT DETECTED NOT DETECTED Final   Pseudomonas aeruginosa NOT DETECTED NOT DETECTED Final   Stenotrophomonas maltophilia NOT DETECTED NOT DETECTED Final   Candida albicans NOT DETECTED NOT DETECTED Final   Candida auris NOT DETECTED NOT DETECTED Final   Candida glabrata NOT DETECTED NOT DETECTED Final   Candida krusei NOT DETECTED NOT DETECTED Final   Candida parapsilosis NOT DETECTED NOT DETECTED Final   Candida tropicalis NOT DETECTED NOT DETECTED Final   Cryptococcus neoformans/gattii NOT DETECTED NOT DETECTED Final   Methicillin resistance mecA/C DETECTED (A) NOT DETECTED Final    Comment: CRITICAL RESULT CALLED TO, READ BACK BY AND VERIFIED WITH: NATHAN BELUE @ 2204 12/28/22 LFD Performed at [facility], [address] [place], [place] [address]   Blood Culture (routine x 2)     Status: None (Preliminary result)   Collection Time: 12/28/22  1:06 AM   Specimen: BLOOD  Result Value Ref Range Status   Specimen Description BLOOD RIGHT FOREARM  Final   Special Requests   Final    BOTTLES DRAWN AEROBIC AND ANAEROBIC Blood Culture results may not be optimal due to an inadequate volume of  blood received in culture bottles   Culture   Final    NO GROWTH 2 DAYS Performed at [facility], [address] [place], [place] [address]    Report Status PENDING  Incomplete  C Difficile Quick Screen w PCR reflex     Status: Abnormal   Collection Time: 12/29/22  1:01 PM   Specimen: STOOL  Result Value Ref Range Status   C Diff antigen POSITIVE (A) NEGATIVE Final   C Diff toxin POSITIVE (A) NEGATIVE Final   C Diff interpretation Toxin producing C. difficile detected.  Final    Comment: CRITICAL RESULT CALLED TO, READ BACK BY AND VERIFIED WITH: KATIE  CLAYTON AT 1432 12/29/22.PMF Performed at [facility], [address], [place], [place] [address]          Radiology Studies: No results found.      Scheduled Meds:  acidophilus  1 capsule Oral Daily   vitamin C  500 mg Oral BID   Chlorhexidine Gluconate Cloth  6 each Topical Daily   enoxaparin (LOVENOX) injection  40 mg Subcutaneous Q24H   feeding supplement  1 Container Oral TID BM   gabapentin  100 mg Oral QHS   midodrine  10 mg Oral TID WC   multivitamin with minerals  1 tablet Oral Daily   pantoprazole (PROTONIX) IV  40 mg Intravenous Q24H   phosphorus  500 mg Oral TID   vancomycin  125 mg Oral Q6H   Continuous Infusions:  sodium chloride     sodium chloride 125 mL/hr at 12/30/22 1200     LOS: 2 days    Tresa Moore, MD [facility]   If 7PM-7AM, please contact night-coverage  12/30/2022, 12:18 PM

## 2022-12-30 NOTE — Progress Notes (Addendum)
Patients BP remains low this morning. See flowsheet. MD aware. Midodrine dose increased. Patient remains asymptomatic and mentating appropriately. MAP goal of 55 or greater per Dr. Georgeann Oppenheim.

## 2022-12-30 NOTE — Plan of Care (Signed)
Discussed with patient plan of care for the evening, pain management and bowel elimination with some teach back displayed.  Patient wants some Imodium for her diarrhea let her know that MD might not give it her.  Her stools are still liquid and buttock/sacrum/perineal are is raw and sore from the stools.  Caregiver is in the room but staff handling bowel movements which have made the patient tearful and sad.  Problem: Education: Goal: Knowledge of General Education information will improve Description: Including pain rating scale, medication(s)/side effects and non-pharmacologic comfort measures Outcome: Progressing   Problem: Coping: Goal: Level of anxiety will decrease Outcome: Not Progressing   Problem: Elimination: Goal: Will not experience complications related to bowel motility Outcome: Not Progressing

## 2022-12-30 NOTE — Plan of Care (Signed)

## 2022-12-31 DIAGNOSIS — K5100 Ulcerative (chronic) pancolitis without complications: Secondary | ICD-10-CM | POA: Diagnosis not present

## 2022-12-31 LAB — CULTURE, BLOOD (ROUTINE X 2)

## 2022-12-31 LAB — URINALYSIS, W/ REFLEX TO CULTURE (INFECTION SUSPECTED)
Bilirubin Urine: NEGATIVE
Glucose, UA: NEGATIVE mg/dL
Ketones, ur: NEGATIVE mg/dL
Nitrite: POSITIVE — AB
Protein, ur: 30 mg/dL — AB
Specific Gravity, Urine: 1.031 — ABNORMAL HIGH (ref 1.005–1.030)
Squamous Epithelial / HPF: NONE SEEN /HPF (ref 0–5)
WBC, UA: >50 WBC/hpf (ref 0–5)
pH: 5 (ref 5.0–8.0)

## 2022-12-31 LAB — BASIC METABOLIC PANEL
Anion gap: 5 (ref 5–15)
BUN: 10 mg/dL (ref 8–23)
CO2: 15 mmol/L — ABNORMAL LOW (ref 22–32)
Calcium: 7.3 mg/dL — ABNORMAL LOW (ref 8.9–10.3)
Chloride: 119 mmol/L — ABNORMAL HIGH (ref 98–111)
Creatinine, Ser: 1.02 mg/dL — ABNORMAL HIGH (ref 0.44–1.00)
GFR, Estimated: 59 mL/min — ABNORMAL LOW (ref >60)
Glucose, Bld: 108 mg/dL — ABNORMAL HIGH (ref 70–99)
Potassium: 3.8 mmol/L (ref 3.5–5.1)
Sodium: 139 mmol/L (ref 135–145)

## 2022-12-31 LAB — PHOSPHORUS: Phosphorus: 3.2 mg/dL (ref 2.5–4.6)

## 2022-12-31 LAB — CBC
HCT: 29.9 % — ABNORMAL LOW (ref 36.0–46.0)
Hemoglobin: 9.1 g/dL — ABNORMAL LOW (ref 12.0–15.0)
MCH: 27.5 pg (ref 26.0–34.0)
MCHC: 30.4 g/dL (ref 30.0–36.0)
MCV: 90.3 fL (ref 80.0–100.0)
Platelets: 338 10*3/uL (ref 150–400)
RBC: 3.31 MIL/uL — ABNORMAL LOW (ref 3.87–5.11)
RDW: 14.3 % (ref 11.5–15.5)
WBC: 16.8 10*3/uL — ABNORMAL HIGH (ref 4.0–10.5)
nRBC: 0 % (ref 0.0–0.2)

## 2022-12-31 LAB — GLUCOSE, CAPILLARY
Glucose-Capillary: 87 mg/dL (ref 70–99)
Glucose-Capillary: 121 mg/dL — ABNORMAL HIGH (ref 70–99)
Glucose-Capillary: 98 mg/dL (ref 70–99)
Glucose-Capillary: 153 mg/dL — ABNORMAL HIGH (ref 70–99)
Glucose-Capillary: 154 mg/dL — ABNORMAL HIGH (ref 70–99)
Glucose-Capillary: 135 mg/dL — ABNORMAL HIGH (ref 70–99)
Glucose-Capillary: 141 mg/dL — ABNORMAL HIGH (ref 70–99)

## 2022-12-31 LAB — MAGNESIUM: Magnesium: 1.9 mg/dL (ref 1.7–2.4)

## 2022-12-31 LAB — MRSA NEXT GEN BY PCR, NASAL: MRSA by PCR Next Gen: NOT DETECTED

## 2022-12-31 MED ORDER — CHOLESTYRAMINE LIGHT 4 G PO PACK
4.0000 g | PACK | Freq: Two times a day (BID) | ORAL | Status: DC
  Filled 2022-12-31: qty 1

## 2022-12-31 MED ORDER — VANCOMYCIN HCL 125 MG PO CAPS: 125.0000 mg | ORAL_CAPSULE | Freq: Every day | ORAL | Status: DC

## 2022-12-31 MED ORDER — LACTATED RINGERS IV SOLN: INTRAVENOUS | Status: DC

## 2022-12-31 MED ORDER — DICLOFENAC SODIUM 75 MG PO TBEC
75.0000 mg | DELAYED_RELEASE_TABLET | Freq: Two times a day (BID) | ORAL | Status: DC
  Administered 2022-12-31 – 2023-01-01 (×4): 75 mg via ORAL
  Filled 2022-12-31 (×5): qty 1

## 2022-12-31 MED ORDER — VANCOMYCIN HCL 125 MG PO CAPS
125.0000 mg | ORAL_CAPSULE | Freq: Four times a day (QID) | ORAL | Status: AC
  Administered 2022-12-31 – 2023-01-12 (×46): 125 mg via ORAL
  Filled 2022-12-31 (×50): qty 1

## 2022-12-31 MED ORDER — LACTATED RINGERS IV BOLUS
500.0000 mL | Freq: Once | INTRAVENOUS | Status: AC
  Administered 2022-12-31: 500 mL via INTRAVENOUS

## 2022-12-31 MED ORDER — JUVEN PO PACK
1.0000 | PACK | Freq: Two times a day (BID) | ORAL | Status: DC
  Administered 2022-12-31 – 2023-01-18 (×31): 1 via ORAL

## 2022-12-31 MED ORDER — VANCOMYCIN HCL 125 MG PO CAPS: 125.0000 mg | ORAL_CAPSULE | ORAL | Status: DC

## 2022-12-31 MED ORDER — ALBUMIN HUMAN 25 % IV SOLN
12.5000 g | Freq: Once | INTRAVENOUS | Status: AC
  Administered 2022-12-31: 12.5 g via INTRAVENOUS
  Filled 2022-12-31: qty 50

## 2022-12-31 MED ORDER — VANCOMYCIN HCL 125 MG PO CAPS
125.0000 mg | ORAL_CAPSULE | Freq: Two times a day (BID) | ORAL | Status: AC
  Administered 2023-01-12 – 2023-01-19 (×14): 125 mg via ORAL
  Filled 2022-12-31 (×14): qty 1

## 2022-12-31 MED ORDER — VITAMIN D 25 MCG (1000 UNIT) PO TABS
1000.0000 [IU] | ORAL_TABLET | Freq: Every day | ORAL | Status: DC
  Administered 2023-01-01 – 2023-01-14 (×14): 1000 [IU] via ORAL
  Filled 2022-12-31 (×14): qty 1

## 2022-12-31 MED ORDER — PROSOURCE PLUS PO LIQD
30.0000 mL | Freq: Three times a day (TID) | ORAL | Status: DC
  Administered 2022-12-31 – 2023-01-16 (×27): 30 mL via ORAL

## 2022-12-31 MED ORDER — SODIUM CHLORIDE 0.9 % IV BOLUS
500.0000 mL | Freq: Once | INTRAVENOUS | Status: AC
  Administered 2022-12-31: 500 mL via INTRAVENOUS

## 2022-12-31 NOTE — Progress Notes (Signed)
   12/31/22 1200  Spiritual Encounters  Type of Visit Initial  Care provided to: Pt and family  Referral source Chaplain assessment  Reason for visit Routine spiritual support  OnCall Visit No  Spiritual Framework  Presenting Themes Courage hope and growth  Values/beliefs Faith and Family  Community/Connection Family;Significant other;Faith community  Patient Stress Factors None identified  Family Stress Factors None identified  Interventions  Spiritual Care Interventions Made Established relationship of care and support;Compassionate presence;Reflective listening;Prayer  Spiritual Care Plan  Spiritual Care Issues Still Outstanding Chaplain will continue to follow;No further spiritual care needs at this time (see row info)   Spoke with patient and spouse of the patient. Ask if they would like to pray and spouse of patient prayer for her while I was present in the room. Patient seem tried and when to sleep. I had a conversation with patient's husband about the church and God. They are members with a local church and have a strong faith believe. Advise spouse if they need a Chaplain we are here 24/7 but you may not always get the same chaplain. Spouse and patient was thankful for the visit.

## 2022-12-31 NOTE — Consult Note (Signed)
PHARMACY CONSULT NOTE  Pharmacy Consult for Electrolyte Monitoring and Replacement   Recent Labs: Potassium (mmol/L)  Date Value  12/31/2022 3.8   Magnesium (mg/dL)  Date Value  09/81/1914 1.9   Calcium (mg/dL)  Date Value  78/29/5621 7.3 (L)   Albumin (g/dL)  Date Value  30/86/5784 2.1 (L)  10/02/2019 3.9   Phosphorus (mg/dL)  Date Value  69/62/9528 3.2   Sodium (mmol/L)  Date Value  12/31/2022 139  10/02/2019 146 (H)   Assessment: 72 y/o F with medical history including HTN, cervical cancer, RA, C diff infection, former smoker, CKD stage 3b admitted with circulatory shock secondary to colitis. Patient tested positive for COVID-19 infection, recently positive in 11/2022. Pharmacy consulted to assist with electrolyte monitoring and replacement as indicated.  Goal of Therapy:  Electrolytes within normal limits  Plan:  --No replacement warranted today --Follow-up electrolytes with AM labs tomorrow   Elliot Gurney, PharmD, BCPS Clinical Pharmacist  12/31/2022 7:51 AM

## 2022-12-31 NOTE — Plan of Care (Signed)

## 2022-12-31 NOTE — Progress Notes (Signed)
PROGRESS NOTE    Jeanne [name]  OAC:[id number] DOB: [birth year] DOA: 12/27/2022 PCP: Jerl Mina, MD    Brief Narrative:  72 yo F presenting to [facility] ED from home for evaluation of diarrhea.   Of note the patient was recently hospitalized with C.Diff from 11/09/22-11/12/22, discharged with PO vancomycin. She states she completed this antibiotic course as prescribed. She reported that she did have resolution of her symptoms for a few weeks. She also had been COVID 19 + during this admission, but was asymptomatic and did not require treatment. She reports that over the past week she has been nauseous and had poor PO intake, but denied vomiting. Over the last 4 days she has had multiple bouts of non-bloody diarrhea and bilateral lower quadrant abdominal pain/cramping. She reported these episodes seemed exactly the same in consistency and smell as during her previous C.Diff infection. She also reports generalized fatigue. She denies fever/chills, urinary symptoms, shortness of breath, productive cough, chest pain, dizziness, body aches, falls, headache or blurred vision.   4/21: Blood pressure remains low but overall asymptomatic.  Lactic acid normal.  Patient not demonstrating shock physiology.  Mentating clearly.  Diarrhea resolving.  4/22: White count downtrending but diarrhea persistent.  Rectal tube placed last night.  Blood pressure remained low but stable.   Assessment & Plan:   Principal Problem:   Pancolitis Active Problems:   COVID-19 virus infection   History of Clostridioides difficile colitis  Pancolitis Diarrhea History of C. difficile infection Unclear nature of her current presentation.  Patient has CT evidence of pancolitis.  She does have a history of C. difficile.  Interestingly patient reports voluminous diarrhea prior to admission however states that once she was admitted the diarrhea stopped.  Infectious disease consulted.  They indicated that patient has a current  positive C. difficile antigen and toxin however the assay was from 11/09/2022.  Despite this considering the clinical scenario we will treat his active C. difficile colitis. Repeat C. difficile positive antigen and toxin Plan: Continue p.o. vancomycin 125 mg every 6 hours Continue IV fluids Midodrine 10 mg 3 times daily Continue rectal tube for now Okay for transition to progressive unit with MAP goal of 55  Circulatory shock Improved.  Patient has been fluid responsive despite borderline low blood pressures.  Lactic acid normal.  Patient mentating well.  Not tachycardic.  Normal lactic acid.  Not demonstrating shock physiology Plan: Increase dose of midodrine as above   Essential hypertension Patient was previously on 2 blood pressure medications but had been taken off of both of them.  Currently blood pressure is borderline.  MAP goal 55 or greater  Rheumatoid arthritis No acute issues Nightly gabapentin Restart home diclofenac  AKI Resolved IV fluids as above  Hypokalemia Monitor and replace  COVID 19 infection Appears asymptomatic   DVT prophylaxis: SQ Lovenox Code Status: Full Family Communication: Husband bedside 4/22 Disposition Plan: Status is: Inpatient Remains inpatient appropriate because: C. difficile colitis   Level of care: Progressive  Consultants:  ID  Procedures:  None  Antimicrobials: Oral vancomycin   Subjective: Seen and examined.  Appears fatigued otherwise asymptomatic.  Objective: Vitals:   12/31/22 1048 12/31/22 1053 12/31/22 1100 12/31/22 1200  BP: (!) 64/32 99/63 (!) 86/55 (!) 82/43  Pulse: 85 88 86 81  Resp: Temp:    97.9 F (36.6 C)  TempSrc:    Oral  SpO2: 100% 100% 99% 98%  Weight:  Height:        Intake/Output Summary (Last 24 hours) at 12/31/2022 1230 Last data filed at 12/31/2022 1030 Gross per 24 hour  Intake 4187.19 ml  Output 365 ml  Net 3822.19 ml   Filed Weights   12/29/22 0500 12/30/22  0712 12/31/22 0500  Weight: 71 kg 71 kg 71 kg    Examination:  General exam: Appears fatigued Respiratory system: Lungs clear.  Normal work of breathing.  Room air Cardiovascular system: S2, RRR, no murmurs, no pedal edema Gastrointestinal system: Soft, NT/ND, hyperactive bowel sounds Central nervous system: Alert and oriented. No focal neurological deficits. Extremities: Symmetric 5 x 5 power. Skin: No rashes, lesions or ulcers Psychiatry: Judgement and insight appear normal. Mood & affect appropriate.     Data Reviewed: I have personally reviewed following labs and imaging studies  CBC: Recent Labs  Lab 12/27/22 2140 12/28/22 0613 12/29/22 0410 12/30/22 0454 12/31/22 0425  WBC 29.0* 23.9* 19.1* 19.2* 16.8*  NEUTROABS 26.3*  --   --   --   --   HGB 12.8 10.7* 9.5* 9.1* 9.1*  HCT 40.7 33.3* 30.5* 29.4* 29.9*  MCV 88.1 87.4 89.2 88.8 90.3  PLT 468* 379 341 342 338   Basic Metabolic Panel: Recent Labs  Lab 12/27/22 2140 12/28/22 0613 12/29/22 0410 12/30/22 0454 12/31/22 0425  NA 131* 133* 135 137 139  K 3.7 4.2 3.1* 4.1 3.8  CL 100 105 110 116* 119*  CO2 22 18* 17* 16* 15*  GLUCOSE 119* 95 102* 110* 108*  BUN 23 21 16 12 10   CREATININE 1.35* 1.14* 0.99 0.88 1.02*  CALCIUM 8.0* 7.3* 7.4* 7.3* 7.3*  MG  --  1.7 2.4 2.0 1.9  PHOS  --  2.8 2.4* 1.9* 3.2   GFR: Estimated Creatinine Clearance: 49.6 mL/min (A) (by C-G formula based on SCr of 1.02 mg/dL (H)). Liver Function Tests: Recent Labs  Lab 12/27/22 2140 12/28/22 0613  AST 15 15  ALT 7 8  ALKPHOS 114 94  BILITOT 0.8 1.1  PROT 6.5 5.6*  ALBUMIN 2.5* 2.1*   No results for input(s): "LIPASE", "AMYLASE" in the last 168 hours. No results for input(s): "AMMONIA" in the last 168 hours. Coagulation Profile: No results for input(s): "INR", "PROTIME" in the last 168 hours. Cardiac Enzymes: No results for input(s): "CKTOTAL", "CKMB", "CKMBINDEX", "TROPONINI" in the last 168 hours. BNP (last 3 results) No  results for input(s): "PROBNP" in the last 8760 hours. HbA1C: No results for input(s): "HGBA1C" in the last 72 hours. CBG: Recent Labs  Lab 12/30/22 1552 12/30/22 2333 12/31/22 0333 12/31/22 0724 12/31/22 1203  GLUCAP 123* 106* 121* 98 153*   Lipid Profile: No results for input(s): "CHOL", "HDL", "LDLCALC", "TRIG", "CHOLHDL", "LDLDIRECT" in the last 72 hours. Thyroid Function Tests: No results for input(s): "TSH", "T4TOTAL", "FREET4", "T3FREE", "THYROIDAB" in the last 72 hours. Anemia Panel: Recent Labs    12/29/22 0410  VITAMINB12 287  FOLATE 9.3   Sepsis Labs: Recent Labs  Lab 12/27/22 2140 12/28/22 0613 12/30/22 1000  PROCALCITON  --  0.58  --   LATICACIDVEN 1.3 0.8 0.9    Recent Results (from the past 240 hour(s))  Resp panel by RT-PCR (RSV, Flu A&B, Covid) Anterior Nasal Swab     Status: Abnormal   Collection Time: 12/28/22 12:20 AM   Specimen: Anterior Nasal Swab  Result Value Ref Range Status   SARS Coronavirus 2 by RT PCR POSITIVE (A) NEGATIVE Final    Comment: (NOTE) SARS-CoV-2 target  nucleic acids are DETECTED.  The SARS-CoV-2 RNA is generally detectable in upper respiratory specimens during the acute phase of infection. Positive results are indicative of the presence of the identified virus, but do not rule out bacterial infection or co-infection with other pathogens not detected by the test. Clinical correlation with patient history and other diagnostic information is necessary to determine patient infection status. The expected result is Negative.  Fact Sheet for Patients: BloggerCourse.com  Fact Sheet for Healthcare Providers: SeriousBroker.it  This test is not yet approved or cleared by the [place] FDA and  has been authorized for detection and/or diagnosis of SARS-CoV-2 by FDA under an Emergency Use Authorization (EUA).  This EUA will remain in effect (meaning this test can be used) for  the duration of  the COVID-19 declaration under Section 564(b)(1) of the A ct, 21 U.S.C. section 360bbb-3(b)(1), unless the authorization is terminated or revoked sooner.     Influenza A by PCR NEGATIVE NEGATIVE Final   Influenza B by PCR NEGATIVE NEGATIVE Final    Comment: (NOTE) The Xpert Xpress SARS-CoV-2/FLU/RSV plus assay is intended as an aid in the diagnosis of influenza from Nasopharyngeal swab specimens and should not be used as a sole basis for treatment. Nasal washings and aspirates are unacceptable for Xpert Xpress SARS-CoV-2/FLU/RSV testing.  Fact Sheet for Patients: BloggerCourse.com  Fact Sheet for Healthcare Providers: SeriousBroker.it  This test is not yet approved or cleared by the [place] FDA and has been authorized for detection and/or diagnosis of SARS-CoV-2 by FDA under an Emergency Use Authorization (EUA). This EUA will remain in effect (meaning this test can be used) for the duration of the COVID-19 declaration under Section 564(b)(1) of the Act, 21 U.S.C. section 360bbb-3(b)(1), unless the authorization is terminated or revoked.     Resp Syncytial Virus by PCR NEGATIVE NEGATIVE Final    Comment: (NOTE) Fact Sheet for Patients: BloggerCourse.com  Fact Sheet for Healthcare Providers: SeriousBroker.it  This test is not yet approved or cleared by the [place] FDA and has been authorized for detection and/or diagnosis of SARS-CoV-2 by FDA under an Emergency Use Authorization (EUA). This EUA will remain in effect (meaning this test can be used) for the duration of the COVID-19 declaration under Section 564(b)(1) of the Act, 21 U.S.C. section 360bbb-3(b)(1), unless the authorization is terminated or revoked.  Performed at [facility], [address] [place], [place] [address]   Blood Culture (routine x 2)     Status: Abnormal    Collection Time: 12/28/22 12:20 AM   Specimen: BLOOD  Result Value Ref Range Status   Specimen Description   Final    BLOOD  LEFT [facility] Performed at [facility], [address] [place], [place] [address]    Special Requests   Final    BOTTLES DRAWN AEROBIC AND ANAEROBIC Blood Culture adequate volume Performed at [facility], [address] [place], [place] [address]    Culture  Setup Time   Final    GRAM POSITIVE COCCI ANAEROBIC BOTTLE ONLY Organism ID to follow CRITICAL RESULT CALLED TO, READ BACK BY AND VERIFIED WITH: NATHAN BELUE @ 2204 12/28/22 LFD Performed at [facility], [address] [place], [place] [address]    Culture (A)  Final    STAPHYLOCOCCUS EPIDERMIDIS THE SIGNIFICANCE OF ISOLATING THIS ORGANISM FROM A SINGLE SET OF BLOOD CULTURES WHEN MULTIPLE SETS ARE DRAWN IS UNCERTAIN. PLEASE NOTIFY THE MICROBIOLOGY DEPARTMENT WITHIN ONE WEEK IF SPECIATION AND SENSITIVITIES ARE  REQUIRED. Performed at [facility], [address], [place], [place] [address]    Report Status 12/31/2022 FINAL  Final  Blood Culture ID Panel (Reflexed)     Status: Abnormal   Collection Time: 12/28/22 12:20 AM  Result Value Ref Range Status   Enterococcus faecalis NOT DETECTED NOT DETECTED Final   Enterococcus Faecium NOT DETECTED NOT DETECTED Final   Listeria monocytogenes NOT DETECTED NOT DETECTED Final   Staphylococcus species DETECTED (A) NOT DETECTED Final    Comment: CRITICAL RESULT CALLED TO, READ BACK BY AND VERIFIED WITH: NATHAN BELUE @ 2204 12/28/22 LFD    Staphylococcus aureus (BCID) NOT DETECTED NOT DETECTED Final   Staphylococcus epidermidis DETECTED (A) NOT DETECTED Final    Comment: Methicillin (oxacillin) resistant coagulase negative staphylococcus. Possible blood culture contaminant (unless isolated from more than one blood culture draw or clinical case suggests pathogenicity). No antibiotic treatment is indicated for blood  culture  contaminants. CRITICAL RESULT CALLED TO, READ BACK BY AND VERIFIED WITH: NATHAN BELUE @ 2204 12/28/22 LFD    Staphylococcus lugdunensis NOT DETECTED NOT DETECTED Final   Streptococcus species NOT DETECTED NOT DETECTED Final   Streptococcus agalactiae NOT DETECTED NOT DETECTED Final   Streptococcus pneumoniae NOT DETECTED NOT DETECTED Final   Streptococcus pyogenes NOT DETECTED NOT DETECTED Final   A.calcoaceticus-baumannii NOT DETECTED NOT DETECTED Final   Bacteroides fragilis NOT DETECTED NOT DETECTED Final   Enterobacterales NOT DETECTED NOT DETECTED Final   Enterobacter cloacae complex NOT DETECTED NOT DETECTED Final   Escherichia coli NOT DETECTED NOT DETECTED Final   Klebsiella aerogenes NOT DETECTED NOT DETECTED Final   Klebsiella oxytoca NOT DETECTED NOT DETECTED Final   Klebsiella pneumoniae NOT DETECTED NOT DETECTED Final   Proteus species NOT DETECTED NOT DETECTED Final   Salmonella species NOT DETECTED NOT DETECTED Final   Serratia marcescens NOT DETECTED NOT DETECTED Final   Haemophilus influenzae NOT DETECTED NOT DETECTED Final   Neisseria meningitidis NOT DETECTED NOT DETECTED Final   Pseudomonas aeruginosa NOT DETECTED NOT DETECTED Final   Stenotrophomonas maltophilia NOT DETECTED NOT DETECTED Final   Candida albicans NOT DETECTED NOT DETECTED Final   Candida auris NOT DETECTED NOT DETECTED Final   Candida glabrata NOT DETECTED NOT DETECTED Final   Candida krusei NOT DETECTED NOT DETECTED Final   Candida parapsilosis NOT DETECTED NOT DETECTED Final   Candida tropicalis NOT DETECTED NOT DETECTED Final   Cryptococcus neoformans/gattii NOT DETECTED NOT DETECTED Final   Methicillin resistance mecA/C DETECTED (A) NOT DETECTED Final    Comment: CRITICAL RESULT CALLED TO, READ BACK BY AND VERIFIED WITH: NATHAN BELUE @ 2204 12/28/22 LFD Performed at [facility], [address] [place], [place] [address]   Blood Culture (routine x 2)     Status: None (Preliminary  result)   Collection Time: 12/28/22  1:06 AM   Specimen: Blood  Result Value Ref Range Status   Specimen Description BLOOD RIGHT FOREARM  Final   Special Requests   Final    BOTTLES DRAWN AEROBIC AND ANAEROBIC Blood Culture results may not be optimal due to an inadequate volume of blood received in culture bottles   Culture   Final    NO GROWTH 2 DAYS Performed at [facility], [address] [place], [place] [address]    Report Status PENDING  Incomplete  C Difficile Quick Screen w PCR reflex     Status: Abnormal   Collection Time: 12/29/22  1:01 PM   Specimen: STOOL  Result Value Ref Range Status   C Diff antigen POSITIVE (A) NEGATIVE Final   C Diff toxin POSITIVE (A) NEGATIVE Final   C Diff interpretation Toxin producing C. difficile detected.  Final    Comment: CRITICAL RESULT CALLED TO, READ BACK BY AND VERIFIED WITH: KATIE CLAYTON AT 1432 12/29/22.PMF Performed at [facility], [address] [place], [place] [address]   MRSA Next Gen by PCR, Nasal     Status: None   Collection Time: 12/31/22 12:46 AM   Specimen: Nasal Mucosa; Nasal Swab  Result Value Ref Range Status   MRSA by PCR Next Gen NOT DETECTED NOT DETECTED Final    Comment: (NOTE) The GeneXpert MRSA Assay (FDA approved for NASAL specimens only), is one component of a comprehensive MRSA colonization surveillance program. It is not intended to diagnose MRSA infection nor to guide or monitor treatment for MRSA infections. Test performance is not FDA approved in patients less than 29 years old. Performed at [facility], [address], [place], [place] [address]          Radiology Studies: No results found.      Scheduled Meds:  acidophilus  1 capsule Oral Daily   vitamin C  500 mg Oral BID   Chlorhexidine Gluconate Cloth  6 each Topical Daily   diclofenac  75 mg Oral BID   enoxaparin (LOVENOX) injection  40 mg Subcutaneous Q24H   gabapentin  100 mg Oral QHS    Gerhardt's butt cream   Topical Daily   midodrine  10 mg Oral TID WC   multivitamin with minerals  1 tablet Oral Daily   nutrition supplement (JUVEN)  1 packet Oral BID BM   pantoprazole (PROTONIX) IV  40 mg Intravenous Q24H   vancomycin  125 mg Oral Q6H   Continuous Infusions:  sodium chloride     lactated ringers 125 mL/hr at 12/31/22 0844     LOS: 3 days    Tresa Moore, MD [facility]   If 7PM-7AM, please contact night-coverage  12/31/2022, 12:30 PM

## 2023-01-01 DIAGNOSIS — K5100 Ulcerative (chronic) pancolitis without complications: Secondary | ICD-10-CM | POA: Diagnosis not present

## 2023-01-01 LAB — BASIC METABOLIC PANEL
Anion gap: 4 — ABNORMAL LOW (ref 5–15)
BUN: 20 mg/dL (ref 8–23)
CO2: 14 mmol/L — ABNORMAL LOW (ref 22–32)
Calcium: 7.1 mg/dL — ABNORMAL LOW (ref 8.9–10.3)
Chloride: 119 mmol/L — ABNORMAL HIGH (ref 98–111)
Creatinine, Ser: 1.24 mg/dL — ABNORMAL HIGH (ref 0.44–1.00)
GFR, Estimated: 47 mL/min — ABNORMAL LOW (ref >60)
Glucose, Bld: 122 mg/dL — ABNORMAL HIGH (ref 70–99)
Potassium: 3.6 mmol/L (ref 3.5–5.1)
Sodium: 137 mmol/L (ref 135–145)

## 2023-01-01 LAB — CBC
HCT: 30.7 % — ABNORMAL LOW (ref 36.0–46.0)
Hemoglobin: 9.4 g/dL — ABNORMAL LOW (ref 12.0–15.0)
MCH: 27.7 pg (ref 26.0–34.0)
MCHC: 30.6 g/dL (ref 30.0–36.0)
MCV: 90.6 fL (ref 80.0–100.0)
Platelets: 367 10*3/uL (ref 150–400)
RBC: 3.39 MIL/uL — ABNORMAL LOW (ref 3.87–5.11)
RDW: 14.6 % (ref 11.5–15.5)
WBC: 14.3 10*3/uL — ABNORMAL HIGH (ref 4.0–10.5)
nRBC: 0 % (ref 0.0–0.2)

## 2023-01-01 LAB — GLUCOSE, CAPILLARY
Glucose-Capillary: 103 mg/dL — ABNORMAL HIGH (ref 70–99)
Glucose-Capillary: 105 mg/dL — ABNORMAL HIGH (ref 70–99)
Glucose-Capillary: 85 mg/dL (ref 70–99)
Glucose-Capillary: 95 mg/dL (ref 70–99)
Glucose-Capillary: 98 mg/dL (ref 70–99)
Glucose-Capillary: 124 mg/dL — ABNORMAL HIGH (ref 70–99)
Glucose-Capillary: 121 mg/dL — ABNORMAL HIGH (ref 70–99)

## 2023-01-01 LAB — URINE CULTURE

## 2023-01-01 LAB — ZINC: Zinc: 37 ug/dL — ABNORMAL LOW (ref 44–115)

## 2023-01-01 MED ORDER — MIDODRINE HCL 5 MG PO TABS
10.0000 mg | ORAL_TABLET | Freq: Once | ORAL | Status: AC
  Administered 2023-01-01: 10 mg via ORAL
  Filled 2023-01-01: qty 2

## 2023-01-01 MED ORDER — ALBUMIN HUMAN 25 % IV SOLN
12.5000 g | Freq: Once | INTRAVENOUS | Status: AC
  Administered 2023-01-01: 12.5 g via INTRAVENOUS
  Filled 2023-01-01: qty 50

## 2023-01-01 MED ORDER — ZINC SULFATE 220 (50 ZN) MG PO CAPS
220.0000 mg | ORAL_CAPSULE | Freq: Every day | ORAL | Status: DC
  Administered 2023-01-02 – 2023-01-19 (×18): 220 mg via ORAL
  Filled 2023-01-01 (×18): qty 1

## 2023-01-01 NOTE — Significant Event (Signed)
Notified by bedside RN.  Patients recorded UOP approx 100cc for day shift.  May not be accurate as purewick malfunctioned and bedding was saturated  Bladder scan 128cc.    Recs: Defer foley catheter for now Check UA Continue to monitor UOP Recheck creatinine in AM Consider repeat CT imaging vs nephro consult if creat continues to rise

## 2023-01-01 NOTE — Progress Notes (Signed)
PROGRESS NOTE    Jeanne [name]  VQQ:[id number] DOB: 02/02/1951 DOA: 12/27/2022 PCP: Jerl Mina, MD    Brief Narrative:  72 yo F presenting to [facility] ED from home for evaluation of diarrhea.   Of note the patient was recently hospitalized with C.Diff from 11/09/22-11/12/22, discharged with PO vancomycin. She states she completed this antibiotic course as prescribed. She reported that she did have resolution of her symptoms for a few weeks. She also had been COVID 19 + during this admission, but was asymptomatic and did not require treatment. She reports that over the past week she has been nauseous and had poor PO intake, but denied vomiting. Over the last 4 days she has had multiple bouts of non-bloody diarrhea and bilateral lower quadrant abdominal pain/cramping. She reported these episodes seemed exactly the same in consistency and smell as during her previous C.Diff infection. She also reports generalized fatigue. She denies fever/chills, urinary symptoms, shortness of breath, productive cough, chest pain, dizziness, body aches, falls, headache or blurred vision.   4/21: Blood pressure remains low but overall asymptomatic.  Lactic acid normal.  Patient not demonstrating shock physiology.  Mentating clearly.  Diarrhea resolving.  4/22: White count downtrending but diarrhea persistent.  Rectal tube placed last night.  Blood pressure remained low but stable.  4/23: White count downtrending.  Diarrhea persists but improving.  Remains hypotensive but stable   Assessment & Plan:   Principal Problem:   Pancolitis Active Problems:   COVID-19 virus infection   History of Clostridioides difficile colitis  Pancolitis Diarrhea History of C. difficile infection Unclear nature of her current presentation.  Patient has CT evidence of pancolitis.  She does have a history of C. difficile.  This is first recurrence Repeat C. difficile positive antigen and toxin Plan: Continue p.o. vancomycin 125  mg every 6 hours Pharmacy consulted for 6 week vanco taper Continue IV fluids Midodrine 10 mg 3 times daily Okay for transition to progressive unit with MAP goal of 55  AKI Suspect prerenal azotemia.  Increased rate of fluids.  Recheck creatinine in a.m.  Circulatory shock Improved.  Patient has been fluid responsive despite borderline low blood pressures.  Lactic acid normal.  Patient mentating well.  Not tachycardic.  Normal lactic acid.  Not demonstrating shock physiology Plan: Continue dose of midodrine as above   Essential hypertension Patient was previously on 2 blood pressure medications but had been taken off of both of them.  Currently blood pressure is borderline.  MAP goal 55 or greater  Rheumatoid arthritis No acute issues Nightly gabapentin Continue home diclofenac  AKI Resolved IV fluids as above  Hypokalemia Monitor and replace  COVID 19 infection Appears asymptomatic   DVT prophylaxis: SQ Lovenox Code Status: Full Family Communication: Husband bedside 4/22, via phone 4/23 Disposition Plan: Status is: Inpatient Remains inpatient appropriate because: C. difficile colitis   Level of care: Progressive  Consultants:  ID  Procedures:  None  Antimicrobials: Oral vancomycin   Subjective: Seen and examined.  Appears fatigued otherwise asymptomatic.  Objective: Vitals:   01/01/23 1000 01/01/23 1200 01/01/23 1300 01/01/23 1400  BP: (!) 64/53 (!) 101/55 101/83 112/83  Pulse: 68 77 64 81  Resp: 10 14 (!) 22 15  Temp:      TempSrc:      SpO2: 98% 95% 98% 98%  Weight:      Height:        Intake/Output Summary (Last 24 hours) at 01/01/2023 1438 Last data filed at 01/01/2023  1400 Gross per 24 hour  Intake 3939.65 ml  Output 170 ml  Net 3769.65 ml   Filed Weights   12/30/22 0712 12/31/22 0500 01/01/23 0715  Weight: 71 kg 71 kg 81.8 kg    Examination:  General exam: NAD.  Fatigued Respiratory system: Lungs clear.  Normal work of  breathing.  Room air Cardiovascular system: S2, RRR, no murmurs, no pedal edema Gastrointestinal system: Soft, NT/ND, hyperactive bowel sounds Central nervous system: Alert and oriented. No focal neurological deficits. Extremities: Symmetric 5 x 5 power. Skin: No rashes, lesions or ulcers Psychiatry: Judgement and insight appear normal. Mood & affect appropriate.     Data Reviewed: I have personally reviewed following labs and imaging studies  CBC: Recent Labs  Lab 12/27/22 2140 12/28/22 0613 12/29/22 0410 12/30/22 0454 12/31/22 0425 01/01/23 0432  WBC 29.0* 23.9* 19.1* 19.2* 16.8* 14.3*  NEUTROABS 26.3*  --   --   --   --   --   HGB 12.8 10.7* 9.5* 9.1* 9.1* 9.4*  HCT 40.7 33.3* 30.5* 29.4* 29.9* 30.7*  MCV 88.1 87.4 89.2 88.8 90.3 90.6  PLT 468* 379 341 342 338 367   Basic Metabolic Panel: Recent Labs  Lab 12/28/22 0613 12/29/22 0410 12/30/22 0454 12/31/22 0425 01/01/23 0432  NA 133* 135 137 139 137  K 4.2 3.1* 4.1 3.8 3.6  CL 105 110 116* 119* 119*  CO2 18* 17* 16* 15* 14*  GLUCOSE 95 102* 110* 108* 122*  BUN CREATININE 1.14* 0.99 0.88 1.02* 1.24*  CALCIUM 7.3* 7.4* 7.3* 7.3* 7.1*  MG 1.7 2.4 2.0 1.9  --   PHOS 2.8 2.4* 1.9* 3.2  --    GFR: Estimated Creatinine Clearance: 43.7 mL/min (A) (by C-G formula based on SCr of 1.24 mg/dL (H)). Liver Function Tests: Recent Labs  Lab 12/27/22 2140 12/28/22 0613  AST 15 15  ALT 7 8  ALKPHOS 114 94  BILITOT 0.8 1.1  PROT 6.5 5.6*  ALBUMIN 2.5* 2.1*   No results for input(s): "LIPASE", "AMYLASE" in the last 168 hours. No results for input(s): "AMMONIA" in the last 168 hours. Coagulation Profile: No results for input(s): "INR", "PROTIME" in the last 168 hours. Cardiac Enzymes: No results for input(s): "CKTOTAL", "CKMB", "CKMBINDEX", "TROPONINI" in the last 168 hours. BNP (last 3 results) No results for input(s): "PROBNP" in the last 8760 hours. HbA1C: No results for input(s): "HGBA1C" in the  last 72 hours. CBG: Recent Labs  Lab 12/31/22 1922 12/31/22 2308 01/01/23 0420 01/01/23 0757 01/01/23 1124  GLUCAP 135* 141* 105* 85 95   Lipid Profile: No results for input(s): "CHOL", "HDL", "LDLCALC", "TRIG", "CHOLHDL", "LDLDIRECT" in the last 72 hours. Thyroid Function Tests: No results for input(s): "TSH", "T4TOTAL", "FREET4", "T3FREE", "THYROIDAB" in the last 72 hours. Anemia Panel: No results for input(s): "VITAMINB12", "FOLATE", "FERRITIN", "TIBC", "IRON", "RETICCTPCT" in the last 72 hours.  Sepsis Labs: Recent Labs  Lab 12/27/22 2140 12/28/22 0613 12/30/22 1000  PROCALCITON  --  0.58  --   LATICACIDVEN 1.3 0.8 0.9    Recent Results (from the past 240 hour(s))  Resp panel by RT-PCR (RSV, Flu A&B, Covid) Anterior Nasal Swab     Status: Abnormal   Collection Time: 12/28/22 12:20 AM   Specimen: Anterior Nasal Swab  Result Value Ref Range Status   SARS Coronavirus 2 by RT PCR POSITIVE (A) NEGATIVE Final    Comment: (NOTE) SARS-CoV-2 target nucleic acids are DETECTED.  The SARS-CoV-2 RNA is  generally detectable in upper respiratory specimens during the acute phase of infection. Positive results are indicative of the presence of the identified virus, but do not rule out bacterial infection or co-infection with other pathogens not detected by the test. Clinical correlation with patient history and other diagnostic information is necessary to determine patient infection status. The expected result is Negative.  Fact Sheet for Patients: BloggerCourse.com  Fact Sheet for Healthcare Providers: SeriousBroker.it  This test is not yet approved or cleared by the [place] FDA and  has been authorized for detection and/or diagnosis of SARS-CoV-2 by FDA under an Emergency Use Authorization (EUA).  This EUA will remain in effect (meaning this test can be used) for the duration of  the COVID-19 declaration under Section  564(b)(1) of the A ct, 21 U.S.C. section 360bbb-3(b)(1), unless the authorization is terminated or revoked sooner.     Influenza A by PCR NEGATIVE NEGATIVE Final   Influenza B by PCR NEGATIVE NEGATIVE Final    Comment: (NOTE) The Xpert Xpress SARS-CoV-2/FLU/RSV plus assay is intended as an aid in the diagnosis of influenza from Nasopharyngeal swab specimens and should not be used as a sole basis for treatment. Nasal washings and aspirates are unacceptable for Xpert Xpress SARS-CoV-2/FLU/RSV testing.  Fact Sheet for Patients: BloggerCourse.com  Fact Sheet for Healthcare Providers: SeriousBroker.it  This test is not yet approved or cleared by the [place] FDA and has been authorized for detection and/or diagnosis of SARS-CoV-2 by FDA under an Emergency Use Authorization (EUA). This EUA will remain in effect (meaning this test can be used) for the duration of the COVID-19 declaration under Section 564(b)(1) of the Act, 21 U.S.C. section 360bbb-3(b)(1), unless the authorization is terminated or revoked.     Resp Syncytial Virus by PCR NEGATIVE NEGATIVE Final    Comment: (NOTE) Fact Sheet for Patients: BloggerCourse.com  Fact Sheet for Healthcare Providers: SeriousBroker.it  This test is not yet approved or cleared by the [place] FDA and has been authorized for detection and/or diagnosis of SARS-CoV-2 by FDA under an Emergency Use Authorization (EUA). This EUA will remain in effect (meaning this test can be used) for the duration of the COVID-19 declaration under Section 564(b)(1) of the Act, 21 U.S.C. section 360bbb-3(b)(1), unless the authorization is terminated or revoked.  Performed at [facility], [address] [place], [place] [address]   Blood Culture (routine x 2)     Status: Abnormal   Collection Time: 12/28/22 12:20 AM   Specimen: BLOOD   Result Value Ref Range Status   Specimen Description   Final    BLOOD  LEFT [employer] Performed at [facility], [address], [place], [place] [address]    Special Requests   Final    BOTTLES DRAWN AEROBIC AND ANAEROBIC Blood Culture adequate volume Performed at [facility], [address] [place], [place] [address]    Culture  Setup Time   Final    GRAM POSITIVE COCCI ANAEROBIC BOTTLE ONLY Organism ID to follow CRITICAL RESULT CALLED TO, READ BACK BY AND VERIFIED WITH: NATHAN BELUE @ 2204 12/28/22 LFD Performed at [facility], [address] [place], [place] [address]    Culture (A)  Final    STAPHYLOCOCCUS EPIDERMIDIS THE SIGNIFICANCE OF ISOLATING THIS ORGANISM FROM A SINGLE SET OF BLOOD CULTURES WHEN MULTIPLE SETS ARE DRAWN IS UNCERTAIN. PLEASE NOTIFY THE MICROBIOLOGY DEPARTMENT WITHIN ONE WEEK IF SPECIATION AND SENSITIVITIES ARE REQUIRED. Performed at [facility], [address], [place], [place] [address]    Report Status 12/31/2022 FINAL  Final  Blood Culture ID Panel (Reflexed)     Status: Abnormal   Collection Time: 12/28/22 12:20 AM  Result Value Ref Range Status   Enterococcus faecalis NOT DETECTED NOT DETECTED Final   Enterococcus Faecium NOT DETECTED NOT DETECTED Final   Listeria monocytogenes NOT DETECTED NOT DETECTED Final   Staphylococcus species DETECTED (A) NOT DETECTED Final    Comment: CRITICAL RESULT CALLED TO, READ BACK BY AND VERIFIED WITH: NATHAN BELUE @ 2204 12/28/22 LFD    Staphylococcus aureus (BCID) NOT DETECTED NOT DETECTED Final   Staphylococcus epidermidis DETECTED (A) NOT DETECTED Final    Comment: Methicillin (oxacillin) resistant coagulase negative staphylococcus. Possible blood culture contaminant (unless isolated from more than one blood culture draw or clinical case suggests pathogenicity). No antibiotic treatment is indicated for blood  culture contaminants. CRITICAL RESULT CALLED TO, READ BACK BY AND  VERIFIED WITH: NATHAN BELUE @ 2204 12/28/22 LFD    Staphylococcus lugdunensis NOT DETECTED NOT DETECTED Final   Streptococcus species NOT DETECTED NOT DETECTED Final   Streptococcus agalactiae NOT DETECTED NOT DETECTED Final   Streptococcus pneumoniae NOT DETECTED NOT DETECTED Final   Streptococcus pyogenes NOT DETECTED NOT DETECTED Final   A.calcoaceticus-baumannii NOT DETECTED NOT DETECTED Final   Bacteroides fragilis NOT DETECTED NOT DETECTED Final   Enterobacterales NOT DETECTED NOT DETECTED Final   Enterobacter cloacae complex NOT DETECTED NOT DETECTED Final   Escherichia coli NOT DETECTED NOT DETECTED Final   Klebsiella aerogenes NOT DETECTED NOT DETECTED Final   Klebsiella oxytoca NOT DETECTED NOT DETECTED Final   Klebsiella pneumoniae NOT DETECTED NOT DETECTED Final   Proteus species NOT DETECTED NOT DETECTED Final   Salmonella species NOT DETECTED NOT DETECTED Final   Serratia marcescens NOT DETECTED NOT DETECTED Final   Haemophilus influenzae NOT DETECTED NOT DETECTED Final   Neisseria meningitidis NOT DETECTED NOT DETECTED Final   Pseudomonas aeruginosa NOT DETECTED NOT DETECTED Final   Stenotrophomonas maltophilia NOT DETECTED NOT DETECTED Final   Candida albicans NOT DETECTED NOT DETECTED Final   Candida auris NOT DETECTED NOT DETECTED Final   Candida glabrata NOT DETECTED NOT DETECTED Final   Candida krusei NOT DETECTED NOT DETECTED Final   Candida parapsilosis NOT DETECTED NOT DETECTED Final   Candida tropicalis NOT DETECTED NOT DETECTED Final   Cryptococcus neoformans/gattii NOT DETECTED NOT DETECTED Final   Methicillin resistance mecA/C DETECTED (A) NOT DETECTED Final    Comment: CRITICAL RESULT CALLED TO, READ BACK BY AND VERIFIED WITH: NATHAN BELUE @ 2204 12/28/22 LFD Performed at [facility], [address] [place], [place] [address]   Blood Culture (routine x 2)     Status: None (Preliminary result)   Collection Time: 12/28/22  1:06 AM   Specimen:  BLOOD  Result Value Ref Range Status   Specimen Description BLOOD RIGHT FOREARM  Final   Special Requests   Final    BOTTLES DRAWN AEROBIC AND ANAEROBIC Blood Culture results may not be optimal due to an inadequate volume of blood received in culture bottles   Culture   Final    NO GROWTH 4 DAYS Performed at [facility], [address] [place], [place] [address]    Report Status PENDING  Incomplete  C Difficile Quick Screen w PCR reflex     Status: Abnormal   Collection Time: 12/29/22  1:01 PM   Specimen: STOOL  Result Value Ref Range Status   C Diff  antigen POSITIVE (A) NEGATIVE Final   C Diff toxin POSITIVE (A) NEGATIVE Final   C Diff interpretation Toxin producing C. difficile detected.  Final    Comment: CRITICAL RESULT CALLED TO, READ BACK BY AND VERIFIED WITH: KATIE CLAYTON AT 1432 12/29/22.PMF Performed at [facility], [address], [place], [place] [address]   Urine Culture     Status: Abnormal (Preliminary result)   Collection Time: 12/30/22 11:43 PM   Specimen: Urine, Random  Result Value Ref Range Status   Specimen Description   Final    URINE, RANDOM Performed at [facility], [address] [place], [place] [address]    Special Requests   Final    NONE Reflexed from [id number] Performed at [facility], [address] [place], [place] [address]    Culture (A)  Final    >=100,000 COLONIES/mL KLEBSIELLA OXYTOCA 70,000 COLONIES/mL ENTEROCOCCUS FAECALIS SUSCEPTIBILITIES TO FOLLOW Performed at [facility], [address] [place], [place] [address]    Report Status PENDING  Incomplete  MRSA Next Gen by PCR, Nasal     Status: None   Collection Time: 12/31/22 12:46 AM   Specimen: Nasal Mucosa; Nasal Swab  Result Value Ref Range Status   MRSA by PCR Next Gen NOT DETECTED NOT DETECTED Final    Comment: (NOTE) The GeneXpert MRSA Assay (FDA approved for NASAL specimens only), is one component of a comprehensive MRSA  colonization surveillance program. It is not intended to diagnose MRSA infection nor to guide or monitor treatment for MRSA infections. Test performance is not FDA approved in patients less than 61 years old. Performed at [facility], [address], [place], [place] [address]          Radiology Studies: No results found.      Scheduled Meds:  (feeding supplement) PROSource Plus  30 mL Oral TID BM   acidophilus  1 capsule Oral Daily   vitamin C  500 mg Oral BID   Chlorhexidine Gluconate Cloth  6 each Topical Daily   cholecalciferol  1,000 Units Oral Daily   diclofenac  75 mg Oral BID   enoxaparin (LOVENOX) injection  40 mg Subcutaneous Q24H   gabapentin  100 mg Oral QHS   Gerhardt's butt cream   Topical Daily   midodrine  10 mg Oral TID WC   multivitamin with minerals  1 tablet Oral Daily   nutrition supplement (JUVEN)  1 packet Oral BID BM   pantoprazole (PROTONIX) IV  40 mg Intravenous Q24H   vancomycin  125 mg Oral QID   Followed by   Melene Muller ON 01/12/2023] vancomycin  125 mg Oral BID   Followed by   Melene Muller ON 01/20/2023] vancomycin  125 mg Oral Daily   Followed by   Melene Muller ON 01/27/2023] vancomycin  125 mg Oral QODAY   Followed by   Melene Muller ON 02/04/2023] vancomycin  125 mg Oral Q3 days   [START ON 01/02/2023] zinc sulfate  220 mg Oral Daily   Continuous Infusions:  sodium chloride     lactated ringers 150 mL/hr at 01/01/23 1400     LOS: 4 days    Tresa Moore, MD [facility]   If 7PM-7AM, please contact night-coverage  01/01/2023, 2:38 PM

## 2023-01-01 NOTE — Progress Notes (Signed)
Nutrition Follow Up Note   DOCUMENTATION CODES:   Not applicable  INTERVENTION:   Pro-Source Plus 30ml PO TID- Each supplement provides 100kcal and 15g protein    MVI po daily  Vitamin C  po daily   Pt at high refeed risk; recommend monitor potassium, magnesium and phosphorus labs daily until stable  Vitamin A lab pending   Juven Fruit Punch po BID, each serving provides 95kcal and 2.5g of protein (amino acids glutamine and arginine)  Daily weights  Snacks- 10am, 2pm & 8pm   NUTRITION DIAGNOSIS:   Inadequate oral intake related to acute illness as evidenced by per patient/family report. -ongoing  GOAL:   Patient will meet greater than or equal to 90% of their needs -not met   MONITOR:   PO intake, Supplement acceptance, Diet advancement, Labs, Weight trends, I & O's, Skin  ASSESSMENT:   72 y/o female with h/o HTN, cervical cancer, RA, C. diff, CKD III and recent COVID 19 who is admitted with pancolitis and sepsis.  Met with pt in room today. Pt reports that she is feeling a little better today. Pt reports eating most of a piece of toast for breakfast this morning. Pt documented to have eaten 90% of breakfast yesterday morning. Pt reports early satiety. Boost Breeze discontinued by MD; [employer] was added by MD. Pt reports that she is drinking the Juven. RD again discussed with pt the importance of adequate nutrition needed to preserve lean muscle. Pt is willing to try Prosource Plus protein supplements as Heinz Knuckles is a poor source of protein and Boost will be too much volume. Will continue Juven as pt with skin breakdown from constant diarrhea. RD will add snack per pt request. Pt remains at high refeed risk. Zinc and vitamin D returned low and are being supplemented; vitamin A is still pending.     Medications reviewed and include: risaquad, vitamin C, D3, lovenox, MVI, juven, protonix, vancomycin, zinc, LRS /hr  Labs reviewed: Na 137 wnl, K 3.6 wnl, creat  1.24(H) P 3.2 wnl, Mg 1.9 wnl- 4/22 Folate 9.3 wnl, vitamin D 26.50(L), zinc 37(L)- 4/20 Wbc- 14.3(H), Hgb 9.4(L), Hct 30.7(L) Cbgs- 95, 85, 105 x 24 hrs   Diet Order:   Diet Order             Diet regular Fluid consistency: Thin  Diet effective now                  EDUCATION NEEDS:   Education needs have been addressed  Skin:  Skin Assessment: Reviewed RN Assessment (non pressure wound buttocks)  Last BM:  4/23- TYPE 6  Height:   Ht Readings from Last 1 Encounters:  12/27/22  (1.626 m)    Weight:   Wt Readings from Last 1 Encounters:  01/01/23 81.8 kg    Ideal Body Weight:  54.5 kg  BMI:  Body mass index is 30.95 kg/m.  Estimated Nutritional Needs:   Kcal:  1600-1800kcal/day  Protein:  80-90g/day  Fluid:  1.5-1.7L/day  Betsey Holiday MS, RD, LDN Please refer to [facility] for RD and/or RD on-call/weekend/after hours pager

## 2023-01-01 NOTE — Consult Note (Signed)
PHARMACY CONSULT NOTE  Pharmacy Consult for Electrolyte Monitoring and Replacement   Recent Labs: Potassium (mmol/L)  Date Value  01/01/2023 3.6   Magnesium (mg/dL)  Date Value  16/06/9603 1.9   Calcium (mg/dL)  Date Value  54/05/8118 7.1 (L)   Albumin (g/dL)  Date Value  14/78/2956 2.1 (L)  10/02/2019 3.9   Phosphorus (mg/dL)  Date Value  21/30/8657 3.2   Sodium (mmol/L)  Date Value  01/01/2023 137  10/02/2019 146 (H)   Assessment: 72 y/o F with medical history including HTN, cervical cancer, RA, C diff infection, former smoker, CKD stage 3b admitted with circulatory shock secondary to colitis. Patient tested positive for COVID-19 infection, recently positive in 11/2022. Pharmacy consulted to assist with electrolyte monitoring and replacement as indicated.  Goal of Therapy:  Electrolytes within normal limits  Plan:  --No replacement warranted today --Follow-up electrolytes with AM labs tomorrow   Elliot Gurney, PharmD, BCPS Clinical Pharmacist  01/01/2023 8:00 AM

## 2023-01-02 ENCOUNTER — Inpatient Hospital Stay (HOSPITAL_COMMUNITY)
Admit: 2023-01-02 | Discharge: 2023-01-02 | Disposition: A | Discharge status: Home / Self Care | Payer: Medicare HMO | Attending: Internal Medicine | Admitting: Internal Medicine

## 2023-01-02 ENCOUNTER — Encounter: Payer: Self-pay | Admitting: Pulmonary Disease

## 2023-01-02 DIAGNOSIS — Z8619 Personal history of other infectious and parasitic diseases: Secondary | ICD-10-CM | POA: Diagnosis not present

## 2023-01-02 DIAGNOSIS — I509 Heart failure, unspecified: Secondary | ICD-10-CM | POA: Diagnosis not present

## 2023-01-02 DIAGNOSIS — K5100 Ulcerative (chronic) pancolitis without complications: Secondary | ICD-10-CM | POA: Diagnosis not present

## 2023-01-02 DIAGNOSIS — I959 Hypotension, unspecified: Secondary | ICD-10-CM | POA: Diagnosis present

## 2023-01-02 LAB — BASIC METABOLIC PANEL
Anion gap: 4 — ABNORMAL LOW (ref 5–15)
BUN: 26 mg/dL — ABNORMAL HIGH (ref 8–23)
CO2: 18 mmol/L — ABNORMAL LOW (ref 22–32)
Calcium: 7.6 mg/dL — ABNORMAL LOW (ref 8.9–10.3)
Chloride: 115 mmol/L — ABNORMAL HIGH (ref 98–111)
Creatinine, Ser: 1.39 mg/dL — ABNORMAL HIGH (ref 0.44–1.00)
GFR, Estimated: 41 mL/min — ABNORMAL LOW (ref >60)
Glucose, Bld: 110 mg/dL — ABNORMAL HIGH (ref 70–99)
Potassium: 4.2 mmol/L (ref 3.5–5.1)
Sodium: 137 mmol/L (ref 135–145)

## 2023-01-02 LAB — URINALYSIS, ROUTINE W REFLEX MICROSCOPIC
Bilirubin Urine: NEGATIVE
Glucose, UA: NEGATIVE mg/dL
Ketones, ur: NEGATIVE mg/dL
Nitrite: NEGATIVE
Protein, ur: NEGATIVE mg/dL
Specific Gravity, Urine: 1.008 (ref 1.005–1.030)
WBC, UA: >50 WBC/hpf (ref 0–5)
pH: 5 (ref 5.0–8.0)

## 2023-01-02 LAB — MAGNESIUM: Magnesium: 1.8 mg/dL (ref 1.7–2.4)

## 2023-01-02 LAB — CBC
HCT: 34.9 % — ABNORMAL LOW (ref 36.0–46.0)
Hemoglobin: 10.7 g/dL — ABNORMAL LOW (ref 12.0–15.0)
MCH: 27.5 pg (ref 26.0–34.0)
MCHC: 30.7 g/dL (ref 30.0–36.0)
MCV: 89.7 fL (ref 80.0–100.0)
Platelets: 397 10*3/uL (ref 150–400)
RBC: 3.89 MIL/uL (ref 3.87–5.11)
RDW: 14.9 % (ref 11.5–15.5)
WBC: 14.1 10*3/uL — ABNORMAL HIGH (ref 4.0–10.5)
nRBC: 0 % (ref 0.0–0.2)

## 2023-01-02 LAB — GLUCOSE, CAPILLARY
Glucose-Capillary: 88 mg/dL (ref 70–99)
Glucose-Capillary: 83 mg/dL (ref 70–99)
Glucose-Capillary: 98 mg/dL (ref 70–99)
Glucose-Capillary: 113 mg/dL — ABNORMAL HIGH (ref 70–99)

## 2023-01-02 LAB — PHOSPHORUS: Phosphorus: 2.9 mg/dL (ref 2.5–4.6)

## 2023-01-02 LAB — URINE CULTURE: Culture: >=100000 — AB

## 2023-01-02 LAB — ECHOCARDIOGRAM COMPLETE: Weight: 2959.46 oz

## 2023-01-02 LAB — CULTURE, BLOOD (ROUTINE X 2)

## 2023-01-02 LAB — LACTIC ACID, PLASMA
Lactic Acid, Venous: 1.4 mmol/L (ref 0.5–1.9)
Lactic Acid, Venous: 1.3 mmol/L (ref 0.5–1.9)

## 2023-01-02 MED ORDER — ORAL CARE MOUTH RINSE: 15.0000 mL | OROMUCOSAL | Status: DC | PRN

## 2023-01-02 MED ORDER — LACTATED RINGERS IV BOLUS: 1000.0000 mL | Freq: Once | INTRAVENOUS | Status: DC

## 2023-01-02 MED ORDER — HYDROCORTISONE SOD SUC (PF) 100 MG IJ SOLR
100.0000 mg | Freq: Three times a day (TID) | INTRAMUSCULAR | Status: DC
  Administered 2023-01-02 – 2023-01-03 (×4): 100 mg via INTRAVENOUS
  Filled 2023-01-02 (×4): qty 2

## 2023-01-02 NOTE — Evaluation (Signed)
Occupational Therapy Evaluation Patient Details Name: Jeanne [name] MRN: [id number] DOB: 1951-05-08 Today's Date: 01/02/2023   History of Present Illness 72 y/o female recently hospitalized with C.Diff from 11/09/22-11/12/22, discharged with PO vancomycin. She states she completed this antibiotic course as prescribed. She reported that she did have resolution of her symptoms for a few weeks. She also had been COVID 19 + during this admission, but was asymptomatic and did not require treatment.  She reports that over the past week she has been nauseous and had poor PO intake, but denied vomiting. Over the last 4 days she has had multiple bouts of non-bloody diarrhea and bilateral lower quadrant abdominal pain/cramping. She reported these episodes seemed exactly the same in consistency and smell as during her previous C.Diff infection. She also reports generalized fatigue.   Clinical Impression   Patient presenting with decreased Ind in self care,balance, functional mobility/transfers, endurance, and safety awareness. Patient reports living at home with husband and daughter who assist her from bed level with self care tasks and they transfer her into chair. She uses purewick and bed pan at home for toileting needs from hospital bed.  Patient currently functioning at max-total A for self care needs. Pt is limited this session secondary to pain and fatigue. OT discussed if therapy would be better at a certain time of day and she requests directly after lunch. Patient will benefit from acute OT to increase overall independence in the areas of ADLs, functional mobility, and safety awareness in order to safely discharge.     Recommendations for follow up therapy are one component of a multi-disciplinary discharge planning process, led by the attending physician.  Recommendations may be updated based on patient status, additional functional criteria and insurance authorization.   Assistance Recommended at  Discharge Frequent or constant Supervision/Assistance  Patient can return home with the following A lot of help with walking and/or transfers;A lot of help with bathing/dressing/bathroom;Assistance with cooking/housework;Assist for transportation;Help with stairs or ramp for entrance    Functional Status Assessment  Patient has had a recent decline in their functional status and demonstrates the ability to make significant improvements in function in a reasonable and predictable amount of time.  Equipment Recommendations  None recommended by OT       Precautions / Restrictions Precautions Precautions: Fall Restrictions Weight Bearing Restrictions: No RLE Weight Bearing: Non weight bearing      Mobility Bed Mobility Overal bed mobility: Needs Assistance Bed Mobility: Rolling Rolling: Max assist              Transfers                   General transfer comment: pt defers          ADL either performed or assessed with clinical judgement   ADL Overall ADL's : Needs assistance/impaired Eating/Feeding: Set up Eating/Feeding Details (indicate cue type and reason): some set up of tray items                                   General ADL Comments: Pt is able to perform grooming tasks with set up A. Heavy assistance needed for LB self care. She is able to cross midline and reach for bed rail to roll in bed     Vision Patient Visual Report: No change from baseline  Pertinent Vitals/Pain Pain Assessment Pain Assessment: No/denies pain     Hand Dominance Right   Extremity/Trunk Assessment Upper Extremity Assessment Upper Extremity Assessment: Generalized weakness (ulnar drift)   Lower Extremity Assessment Lower Extremity Assessment: Generalized weakness       Communication Communication Communication: No difficulties   Cognition Arousal/Alertness: Awake/alert Behavior During Therapy: WFL for tasks  assessed/performed Overall Cognitive Status: Within Functional Limits for tasks assessed                                                  Home Living Family/patient expects to be discharged to:: Private residence Living Arrangements: Spouse/significant other;Children Available Help at Discharge: Family;Available 24 hours/day Type of Home: House Home Access: Ramped entrance     Home Layout: One level               Home Equipment: Agricultural consultant (2 wheels);Wheelchair - manual;Hospital bed;BSC/3in1          Prior Functioning/Environment Prior Level of Function : Needs assist             Mobility Comments: has been working on minimal ambulation with HHPT. Short distances (5 ft max). Uses WC for long distances. Has left house once since hip surgery ADLs Comments: Pt reports mod A for self care tasks from bed level with family assisting. She uses bed pan and pure wick for toileting needs.        OT Problem List: Decreased strength;Decreased activity tolerance;Decreased safety awareness;Impaired balance (sitting and/or standing);Decreased knowledge of use of DME or AE      OT Treatment/Interventions: Self-care/ADL training;Therapeutic exercise;Therapeutic activities;DME and/or AE instruction;Patient/family education;Balance training;Energy conservation    OT Goals(Current goals can be found in the care plan section) Acute Rehab OT Goals Patient Stated Goal: to go home OT Goal Formulation: With patient Time For Goal Achievement: 01/16/23 Potential to Achieve Goals: Fair ADL Goals Pt Will Perform Grooming: sitting;with supervision Pt Will Perform Lower Body Dressing: with min assist;bed level Pt/caregiver will Perform Home Exercise Program: Increased strength;Both right and left upper extremity;With theraband;With written HEP provided;With Supervision  OT Frequency: Min 1X/week       AM-PAC OT "6 Clicks" Daily Activity     Outcome Measure Help  from another person eating meals?: A Little Help from another person taking care of personal grooming?: A Little Help from another person toileting, which includes using toliet, bedpan, or urinal?: Total Help from another person bathing (including washing, rinsing, drying)?: A Lot Help from another person to put on and taking off regular upper body clothing?: A Little Help from another person to put on and taking off regular lower body clothing?: Total 6 Click Score: 13   End of Session Nurse Communication: Mobility status  Activity Tolerance: Patient limited by fatigue Patient left: in bed;with call bell/phone within reach;with family/visitor present  OT Visit Diagnosis: Muscle weakness (generalized) (M62.81)                Time: 9604-5409 OT Time Calculation (min): 26 min Charges:  OT General Charges $OT Visit: 1 Visit OT Evaluation $OT Eval Moderate Complexity: 1 Mod OT Treatments $Therapeutic Activity: 8-22 mins  Jackquline Denmark, MS, OTR/L , CBIS ascom [phone]  01/02/23, 4:18 PM

## 2023-01-02 NOTE — Progress Notes (Addendum)
Progress Note    Jeanne [name]  ZOX:[id number] DOB: 1951-07-13  DOA: 12/27/2022 PCP: Jerl Mina, MD      Brief Narrative:    Medical records reviewed and are as summarized below:  Jeanne [name] is a 72 y.o. female with medical history significant for cervical cancer, rheumatoid arthritis, hypertension, nephrolithiasis, left UPJ urolithiasis, CKD stage IIIb. Of note the patient was recently hospitalized with C.Diff from 11/09/22-11/12/22, discharged with PO vancomycin. She states she completed this antibiotic course as prescribed. She reported that she did have resolution of her symptoms for a few weeks. She also had been COVID 19 + during this admission, but was asymptomatic and did not require treatment.   She presented to the hospital because of diarrhea.      Assessment/Plan:   Principal Problem:   Pancolitis Active Problems:   COVID-19 virus infection   History of Clostridioides difficile colitis   Hypotension   Nutrition Problem: Inadequate oral intake Etiology: acute illness  Signs/Symptoms: per patient/family report   Body mass index is 31.75 kg/m.  (Obesity)    Recurrent C. difficile colitis, pancolitis on CT abdomen, recent C. difficile infection on 11/09/2022: Continue oral vancomycin taper.  Discontinue acidophilus probiotic   Persistent hypotension: Reengaged intensivist, Dr. Belia Heman, to assist with management.  Patient will be started on IV hydrocortisone.  Decrease IV Ringer lactate infusion 150 mL/h to 75 mL/h.  Monitor BP closely   AKI versus CKD stage IIIb: Creatinine is trending up.  Discontinue diclofenac.  Renal ultrasound has been ordered because of history of nephrolithiasis and ureterolithiasis.  Continue IV fluids.   Rheumatoid arthritis: Oxycodone as needed for pain.  Diclofenac has been discontinued because of kidney disease.   Right total hip arthroplasty in October 2023, debility: Patient said she has not been able to walk since  her surgery.  She gets physical therapy at home.   Peripheral edema: Obtain 2D echo for further evaluation   Diet Order             Diet regular Fluid consistency: Thin  Diet effective now                            Consultants: Intensivist Infectious disease specialist  Procedures: None    Medications:    (feeding supplement) PROSource Plus  30 mL Oral TID BM   vitamin C  500 mg Oral BID   Chlorhexidine Gluconate Cloth  6 each Topical Daily   cholecalciferol  1,000 Units Oral Daily   enoxaparin (LOVENOX) injection  40 mg Subcutaneous Q24H   gabapentin  100 mg Oral QHS   Gerhardt's butt cream   Topical Daily   midodrine  10 mg Oral TID WC   multivitamin with minerals  1 tablet Oral Daily   nutrition supplement (JUVEN)  1 packet Oral BID BM   pantoprazole (PROTONIX) IV  40 mg Intravenous Q24H   vancomycin  125 mg Oral QID   Followed by   Melene Muller ON 01/12/2023] vancomycin  125 mg Oral BID   Followed by   Melene Muller ON 01/20/2023] vancomycin  125 mg Oral Daily   Followed by   Melene Muller ON 01/27/2023] vancomycin  125 mg Oral QODAY   Followed by   Melene Muller ON 02/04/2023] vancomycin  125 mg Oral Q3 days   zinc sulfate  220 mg Oral Daily   Continuous Infusions:  sodium chloride     lactated ringers 150 mL/hr  at 01/02/23 0759     Anti-infectives (From admission, onward)    Start     Dose/Rate Route Frequency Ordered Stop   02/04/23 1000  vancomycin (VANCOCIN) capsule 125 mg       See Hyperspace for full Linked Orders Report.   125 mg Oral Every 3 DAYS 12/31/22 1243 02/13/23 0959   01/27/23 1000  vancomycin (VANCOCIN) capsule 125 mg       See Hyperspace for full Linked Orders Report.   125 mg Oral Every other day 12/31/22 1243 02/04/23 0959   01/20/23 1000  vancomycin (VANCOCIN) capsule 125 mg       See Hyperspace for full Linked Orders Report.   125 mg Oral Daily 12/31/22 1243 01/27/23 0959   01/12/23 2200  vancomycin (VANCOCIN) capsule 125 mg       See  Hyperspace for full Linked Orders Report.   125 mg Oral 2 times daily 12/31/22 1243 01/19/23 2159   12/31/22 1800  vancomycin (VANCOCIN) capsule 125 mg       See Hyperspace for full Linked Orders Report.   125 mg Oral 4 times daily 12/31/22 1243 01/12/23 1759   12/28/22 1415  vancomycin (VANCOCIN) 50 mg/mL oral solution SOLN 125 mg  Status:  Discontinued        125 mg Oral Every 6 hours 12/28/22 1323 12/31/22 1243   12/28/22 1000  metroNIDAZOLE (FLAGYL) IVPB 500 mg  Status:  Discontinued        500 mg 100 mL/hr over 60 Minutes Intravenous Every 12 hours 12/28/22 0410 12/28/22 1426   12/27/22 2345  metroNIDAZOLE (FLAGYL) IVPB 500 mg        500 mg 100 mL/hr over 60 Minutes Intravenous  Once 12/27/22 2344 12/28/22 0246              Family Communication/Anticipated D/C date and plan/Code Status   DVT prophylaxis: enoxaparin (LOVENOX) injection 40 mg Start: 12/28/22 0800 SCDs Start: 12/28/22 0259     Code Status: Full Code  Family Communication: None Disposition Plan: Plans to discharge home in 2 to 3 days   Status is: Inpatient Remains inpatient appropriate because: Recurrent C. difficile colitis and low blood pressure       Subjective:   Interval events noted.  She had 1 loose stool this morning.  She also complains of generalized body pain from arthritis.  She is not aware of any history of CKD.  She does not have chronically low BP.  Objective:    Vitals:   01/02/23 0600 01/02/23 0700 01/02/23 0731 01/02/23 0800  BP: (!) 77/67 (!) 103/57 (!) 85/48 (!) 151/117  Pulse: 63 80 79 88  Resp: 14 12 16 15   Temp:      TempSrc:      SpO2: 100% 100% 100% 100%  Weight:      Height:       No data found.   Intake/Output Summary (Last 24 hours) at 01/02/2023 1123 Last data filed at 01/02/2023 9528 Gross per 24 hour  Intake 2711.35 ml  Output 200 ml  Net 2511.35 ml   Filed Weights   12/31/22 0500 01/01/23 0715 01/02/23 0500  Weight: 71 kg 81.8 kg 83.9 kg     Exam:  GEN: NAD SKIN: Warm and dry EYES: EOMI ENT: MMM CV: RRR PULM: CTA B ABD: soft, obese, NT, +BS CNS: AAO x 3, non focal EXT: Bilateral leg and pedal edema, no erythema no tenderness        Data Reviewed:  I have personally reviewed following labs and imaging studies:  Labs: Labs show the following:   Basic Metabolic Panel: Recent Labs  Lab 12/28/22 0613 12/29/22 0410 12/30/22 0454 12/31/22 0425 01/01/23 0432 01/02/23 0406  NA 133* 135 137 139 137 137  K 4.2 3.1* 4.1 3.8 3.6 4.2  CL 105 110 116* 119* 119* 115*  CO2 18* 17* 16* 15* 14* 18*  GLUCOSE 95 102* 110* 108* 122* 110*  BUN 26*  CREATININE 1.14* 0.99 0.88 1.02* 1.24* 1.39*  CALCIUM 7.3* 7.4* 7.3* 7.3* 7.1* 7.6*  MG 1.7 2.4 2.0 1.9  --  1.8  PHOS 2.8 2.4* 1.9* 3.2  --  2.9   GFR Estimated Creatinine Clearance: 39.5 mL/min (A) (by C-G formula based on SCr of 1.39 mg/dL (H)). Liver Function Tests: Recent Labs  Lab 12/27/22 2140 12/28/22 0613  AST 15 15  ALT 7 8  ALKPHOS 114 94  BILITOT 0.8 1.1  PROT 6.5 5.6*  ALBUMIN 2.5* 2.1*   No results for input(s): "LIPASE", "AMYLASE" in the last 168 hours. No results for input(s): "AMMONIA" in the last 168 hours. Coagulation profile No results for input(s): "INR", "PROTIME" in the last 168 hours.  CBC: Recent Labs  Lab 12/27/22 2140 12/28/22 0613 12/29/22 0410 12/30/22 0454 12/31/22 0425 01/01/23 0432 01/02/23 0406  WBC 29.0*   < > 19.1* 19.2* 16.8* 14.3* 14.1*  NEUTROABS 26.3*  --   --   --   --   --   --   HGB 12.8   < > 9.5* 9.1* 9.1* 9.4* 10.7*  HCT 40.7   < > 30.5* 29.4* 29.9* 30.7* 34.9*  MCV 88.1   < > 89.2 88.8 90.3 90.6 89.7  PLT 468*   < > 341 342 338 367 397   < > = values in this interval not displayed.   Cardiac Enzymes: No results for input(s): "CKTOTAL", "CKMB", "CKMBINDEX", "TROPONINI" in the last 168 hours. BNP (last 3 results) No results for input(s): "PROBNP" in the last 8760 hours. CBG: Recent  Labs  Lab 01/01/23 1633 01/01/23 2107 01/01/23 2318 01/02/23 0355 01/02/23 0749  GLUCAP 98 124* 121* 88 83   D-Dimer: No results for input(s): "DDIMER" in the last 72 hours. Hgb A1c: No results for input(s): "HGBA1C" in the last 72 hours. Lipid Profile: No results for input(s): "CHOL", "HDL", "LDLCALC", "TRIG", "CHOLHDL", "LDLDIRECT" in the last 72 hours. Thyroid function studies: No results for input(s): "TSH", "T4TOTAL", "T3FREE", "THYROIDAB" in the last 72 hours.  Invalid input(s): "FREET3" Anemia work up: No results for input(s): "VITAMINB12", "FOLATE", "FERRITIN", "TIBC", "IRON", "RETICCTPCT" in the last 72 hours. Sepsis Labs: Recent Labs  Lab 12/27/22 2140 12/28/22 0613 12/29/22 0410 12/30/22 0454 12/30/22 1000 12/31/22 0425 01/01/23 0432 01/02/23 0406  PROCALCITON  --  0.58  --   --   --   --   --   --   WBC 29.0* 23.9*   < > 19.2*  --  16.8* 14.3* 14.1*  LATICACIDVEN 1.3 0.8  --   --  0.9  --   --   --    < > = values in this interval not displayed.    Microbiology Recent Results (from the past 240 hour(s))  Resp panel by RT-PCR (RSV, Flu A&B, Covid) Anterior Nasal Swab     Status: Abnormal   Collection Time: 12/28/22 12:20 AM   Specimen: Anterior Nasal Swab  Result Value Ref Range Status   SARS Coronavirus  2 by RT PCR POSITIVE (A) NEGATIVE Final    Comment: (NOTE) SARS-CoV-2 target nucleic acids are DETECTED.  The SARS-CoV-2 RNA is generally detectable in upper respiratory specimens during the acute phase of infection. Positive results are indicative of the presence of the identified virus, but do not rule out bacterial infection or co-infection with other pathogens not detected by the test. Clinical correlation with patient history and other diagnostic information is necessary to determine patient infection status. The expected result is Negative.  Fact Sheet for Patients: BloggerCourse.com  Fact Sheet for Healthcare  Providers: SeriousBroker.it  This test is not yet approved or cleared by the [place] FDA and  has been authorized for detection and/or diagnosis of SARS-CoV-2 by FDA under an Emergency Use Authorization (EUA).  This EUA will remain in effect (meaning this test can be used) for the duration of  the COVID-19 declaration under Section 564(b)(1) of the A ct, 21 U.S.C. section 360bbb-3(b)(1), unless the authorization is terminated or revoked sooner.     Influenza A by PCR NEGATIVE NEGATIVE Final   Influenza B by PCR NEGATIVE NEGATIVE Final    Comment: (NOTE) The Xpert Xpress SARS-CoV-2/FLU/RSV plus assay is intended as an aid in the diagnosis of influenza from Nasopharyngeal swab specimens and should not be used as a sole basis for treatment. Nasal washings and aspirates are unacceptable for Xpert Xpress SARS-CoV-2/FLU/RSV testing.  Fact Sheet for Patients: BloggerCourse.com  Fact Sheet for Healthcare Providers: SeriousBroker.it  This test is not yet approved or cleared by the [place] FDA and has been authorized for detection and/or diagnosis of SARS-CoV-2 by FDA under an Emergency Use Authorization (EUA). This EUA will remain in effect (meaning this test can be used) for the duration of the COVID-19 declaration under Section 564(b)(1) of the Act, 21 U.S.C. section 360bbb-3(b)(1), unless the authorization is terminated or revoked.     Resp Syncytial Virus by PCR NEGATIVE NEGATIVE Final    Comment: (NOTE) Fact Sheet for Patients: BloggerCourse.com  Fact Sheet for Healthcare Providers: SeriousBroker.it  This test is not yet approved or cleared by the [place] FDA and has been authorized for detection and/or diagnosis of SARS-CoV-2 by FDA under an Emergency Use Authorization (EUA). This EUA will remain in effect (meaning this test can be  used) for the duration of the COVID-19 declaration under Section 564(b)(1) of the Act, 21 U.S.C. section 360bbb-3(b)(1), unless the authorization is terminated or revoked.  Performed at [facility], [address] [place], [place] [address]   Blood Culture (routine x 2)     Status: Abnormal   Collection Time: 12/28/22 12:20 AM   Specimen: BLOOD  Result Value Ref Range Status   Specimen Description   Final    BLOOD  LEFT [facility] Performed at [facility], [address] [place], [place] [address]    Special Requests   Final    BOTTLES DRAWN AEROBIC AND ANAEROBIC Blood Culture adequate volume Performed at [facility], [address] [place], [place] [address]    Culture  Setup Time   Final    GRAM POSITIVE COCCI ANAEROBIC BOTTLE ONLY Organism ID to follow CRITICAL RESULT CALLED TO, READ BACK BY AND VERIFIED WITH: NATHAN BELUE @ 2204 12/28/22 LFD Performed at [facility], [address] [place], [place] [address]    Culture (A)  Final    STAPHYLOCOCCUS EPIDERMIDIS THE SIGNIFICANCE OF ISOLATING THIS ORGANISM FROM A SINGLE SET OF BLOOD CULTURES WHEN MULTIPLE SETS ARE DRAWN  IS UNCERTAIN. PLEASE NOTIFY THE MICROBIOLOGY DEPARTMENT WITHIN ONE WEEK IF SPECIATION AND SENSITIVITIES ARE REQUIRED. Performed at [facility], [address] [place], [place] [address]    Report Status 12/31/2022 FINAL  Final  Blood Culture ID Panel (Reflexed)     Status: Abnormal   Collection Time: 12/28/22 12:20 AM  Result Value Ref Range Status   Enterococcus faecalis NOT DETECTED NOT DETECTED Final   Enterococcus Faecium NOT DETECTED NOT DETECTED Final   Listeria monocytogenes NOT DETECTED NOT DETECTED Final   Staphylococcus species DETECTED (A) NOT DETECTED Final    Comment: CRITICAL RESULT CALLED TO, READ BACK BY AND VERIFIED WITH: NATHAN BELUE @ 2204 12/28/22 LFD    Staphylococcus aureus (BCID) NOT DETECTED NOT DETECTED Final   Staphylococcus  epidermidis DETECTED (A) NOT DETECTED Final    Comment: Methicillin (oxacillin) resistant coagulase negative staphylococcus. Possible blood culture contaminant (unless isolated from more than one blood culture draw or clinical case suggests pathogenicity). No antibiotic treatment is indicated for blood  culture contaminants. CRITICAL RESULT CALLED TO, READ BACK BY AND VERIFIED WITH: NATHAN BELUE @ 2204 12/28/22 LFD    Staphylococcus lugdunensis NOT DETECTED NOT DETECTED Final   Streptococcus species NOT DETECTED NOT DETECTED Final   Streptococcus agalactiae NOT DETECTED NOT DETECTED Final   Streptococcus pneumoniae NOT DETECTED NOT DETECTED Final   Streptococcus pyogenes NOT DETECTED NOT DETECTED Final   A.calcoaceticus-baumannii NOT DETECTED NOT DETECTED Final   Bacteroides fragilis NOT DETECTED NOT DETECTED Final   Enterobacterales NOT DETECTED NOT DETECTED Final   Enterobacter cloacae complex NOT DETECTED NOT DETECTED Final   Escherichia coli NOT DETECTED NOT DETECTED Final   Klebsiella aerogenes NOT DETECTED NOT DETECTED Final   Klebsiella oxytoca NOT DETECTED NOT DETECTED Final   Klebsiella pneumoniae NOT DETECTED NOT DETECTED Final   Proteus species NOT DETECTED NOT DETECTED Final   Salmonella species NOT DETECTED NOT DETECTED Final   Serratia marcescens NOT DETECTED NOT DETECTED Final   Haemophilus influenzae NOT DETECTED NOT DETECTED Final   Neisseria meningitidis NOT DETECTED NOT DETECTED Final   Pseudomonas aeruginosa NOT DETECTED NOT DETECTED Final   Stenotrophomonas maltophilia NOT DETECTED NOT DETECTED Final   Candida albicans NOT DETECTED NOT DETECTED Final   Candida auris NOT DETECTED NOT DETECTED Final   Candida glabrata NOT DETECTED NOT DETECTED Final   Candida krusei NOT DETECTED NOT DETECTED Final   Candida parapsilosis NOT DETECTED NOT DETECTED Final   Candida tropicalis NOT DETECTED NOT DETECTED Final   Cryptococcus neoformans/gattii NOT DETECTED NOT DETECTED  Final   Methicillin resistance mecA/C DETECTED (A) NOT DETECTED Final    Comment: CRITICAL RESULT CALLED TO, READ BACK BY AND VERIFIED WITH: NATHAN BELUE @ 2204 12/28/22 LFD Performed at [facility], [address] [place], [place] [address]   Blood Culture (routine x 2)     Status: None   Collection Time: 12/28/22  1:06 AM   Specimen: BLOOD  Result Value Ref Range Status   Specimen Description BLOOD RIGHT FOREARM  Final   Special Requests   Final    BOTTLES DRAWN AEROBIC AND ANAEROBIC Blood Culture results may not be optimal due to an inadequate volume of blood received in culture bottles   Culture   Final    NO GROWTH 5 DAYS Performed at [facility], [address], [place], [place] [address]    Report Status 01/02/2023 FINAL  Final  C Difficile Quick Screen w PCR reflex     Status:  Abnormal   Collection Time: 12/29/22  1:01 PM   Specimen: STOOL  Result Value Ref Range Status   C Diff antigen POSITIVE (A) NEGATIVE Final   C Diff toxin POSITIVE (A) NEGATIVE Final   C Diff interpretation Toxin producing C. difficile detected.  Final    Comment: CRITICAL RESULT CALLED TO, READ BACK BY AND VERIFIED WITH: KATIE CLAYTON AT 1432 12/29/22.PMF Performed at [facility], [address] [place], [place] [address]   Urine Culture     Status: Abnormal   Collection Time: 12/30/22 11:43 PM   Specimen: Urine, Random  Result Value Ref Range Status   Specimen Description   Final    URINE, RANDOM Performed at [facility], [address] [place], [place] [address]    Special Requests   Final    NONE Reflexed from [id number] Performed at [facility], [address] [place], [place] [address]    Culture (A)  Final    >=100,000 COLONIES/mL KLEBSIELLA OXYTOCA 70,000 COLONIES/mL ENTEROCOCCUS FAECALIS Confirmed Extended Spectrum Beta-Lactamase Producer (ESBL).  In bloodstream infections from ESBL organisms, carbapenems are preferred over  piperacillin/tazobactam. They are shown to have a lower risk of mortality.    Report Status 01/02/2023 FINAL  Final   Organism ID, Bacteria KLEBSIELLA OXYTOCA (A)  Final   Organism ID, Bacteria ENTEROCOCCUS FAECALIS (A)  Final      Susceptibility   Enterococcus faecalis - MIC*    AMPICILLIN <=2 SENSITIVE Sensitive     NITROFURANTOIN <=16 SENSITIVE Sensitive     VANCOMYCIN 1 SENSITIVE Sensitive     * 70,000 COLONIES/mL ENTEROCOCCUS FAECALIS   Klebsiella oxytoca - MIC*    AMPICILLIN >=32 RESISTANT Resistant     CEFEPIME >=32 RESISTANT Resistant     CEFTRIAXONE >=64 RESISTANT Resistant     CIPROFLOXACIN 1 RESISTANT Resistant     GENTAMICIN >=16 RESISTANT Resistant     IMIPENEM <=0.25 SENSITIVE Sensitive     NITROFURANTOIN 32 SENSITIVE Sensitive     TRIMETH/SULFA >=320 RESISTANT Resistant     AMPICILLIN/SULBACTAM >=32 RESISTANT Resistant     PIP/TAZO <=4 SENSITIVE Sensitive     * >=100,000 COLONIES/mL KLEBSIELLA OXYTOCA  MRSA Next Gen by PCR, Nasal     Status: None   Collection Time: 12/31/22 12:46 AM   Specimen: Nasal Mucosa; Nasal Swab  Result Value Ref Range Status   MRSA by PCR Next Gen NOT DETECTED NOT DETECTED Final    Comment: (NOTE) The GeneXpert MRSA Assay (FDA approved for NASAL specimens only), is one component of a comprehensive MRSA colonization surveillance program. It is not intended to diagnose MRSA infection nor to guide or monitor treatment for MRSA infections. Test performance is not FDA approved in patients less than 27 years old. Performed at [facility], [address] [place], [place] [address]     Procedures and diagnostic studies:  No results found.             LOS: 5 days   Laquanna Veazey  [facility]   Pager on [email or web address]. If 7PM-7AM, please contact night-coverage at [email or web address]     01/02/2023, 11:23 AM

## 2023-01-02 NOTE — TOC Initial Note (Addendum)
Transition of Care [facility]) - Initial/Assessment Note    Patient Details  Name: Jeanne [name] MRN: [id number] Date of Birth: Jul 30, 1951  Transition of Care [facility]) CM/SW Contact:    Margarito Liner, LCSW Phone Number: 01/02/2023, 12:26 PM  Clinical Narrative:  Readmission prevention screen complete. Patient on airborne isolation precautions so called her in the room, introduced role, and explained that discharge planning would be discussed. PCP is Jerl Mina, MD. Husband drives her to appointments. [facility] [address] No issues obtaining medications. Patient lives home with her husband. Patient was recently active with [facility] but per Patient Jeanne [name], she was discharged on 4/10. Patient has a wheelchair and walker at home. She is unsure if the walker has 2 or 4 wheels. No further concerns. CSW encouraged patient to contact CSW as needed. CSW will continue to follow patient for support and facilitate return home once stable. Husband will transport her home at discharge.                12:38 pm: Per [facility] liaison, they are still active with patient for PT.   3:11 pm: Patient is not interested in SNF placement but will talk to her husband about it tonight to see what he thinks. Left message for [facility] liaison to see if they can add OT, RN, aide, SW if patient does return home.  3:34 pm: [facility] can add OT, RN, aide but do not have a Child psychotherapist. Liaison said they should still be able to coordinate SNF placement from home with her PCP even if they do not have a Child psychotherapist.  Expected Discharge Plan: Home/Self Care Barriers to Discharge: Continued Medical Work up   Patient Goals and CMS Choice            Expected Discharge Plan and Services     Post Acute Care Choice: NA Living arrangements for the past 2 months: Single Family Home                                      Prior Living Arrangements/Services Living arrangements for the  past 2 months: Single Family Home Lives with:: Spouse Patient language and need for interpreter reviewed:: Yes Do you feel safe going back to the place where you live?: Yes      Need for Family Participation in Patient Care: Yes (Comment) Care giver support system in place?: Yes (comment) Current home services: DME Criminal Activity/Legal Involvement Pertinent to Current Situation/Hospitalization: No - Comment as needed  Activities of Daily Living Home Assistive Devices/Equipment: Eyeglasses, Shower chair with back, Environmental consultant (specify type), Wheelchair ADL Screening (condition at time of admission) Patient's cognitive ability adequate to safely complete daily activities?: Yes Is the patient deaf or have difficulty hearing?: No Does the patient have difficulty seeing, even when wearing glasses/contacts?: No Does the patient have difficulty concentrating, remembering, or making decisions?: No Patient able to express need for assistance with ADLs?: Yes Does the patient have difficulty dressing or bathing?: Yes Independently performs ADLs?: Yes (appropriate for developmental age) Does the patient have difficulty walking or climbing stairs?: Yes Weakness of Legs: Right Weakness of Arms/Hands: None  Permission Sought/Granted                  Emotional Assessment Appearance:: Appears stated age Attitude/Demeanor/Rapport: Engaged, Gracious Affect (typically observed): Accepting, Appropriate, Calm, Pleasant Orientation: : Oriented to Self, Oriented to  Place, Oriented to  Time, Oriented to Situation Alcohol / Substance Use: Not Applicable Psych Involvement: No (comment)  Admission diagnosis:  Shock circulatory [R57.9] Sepsis with hypotension [A41.9, I95.9] Abdominal pain, unspecified abdominal location [R10.9] Diarrhea, unspecified type [R19.7] Patient Active Problem List   Diagnosis Date Noted   Hypotension 01/02/2023   Pancolitis 12/29/2022   History of Clostridioides difficile  colitis 12/29/2022   Shock circulatory 12/28/2022   Sepsis 11/10/2022   Nausea vomiting and diarrhea 11/09/2022   Clostridium difficile diarrhea 11/09/2022   COVID-19 virus infection 11/09/2022   Acute kidney injury superimposed on chronic kidney disease 11/09/2022   Anemia 11/09/2022   Fungal infection of skin 09/23/2019   UTI (urinary tract infection) 07/15/2017   Dysuria 01/28/2017   Chronic radiation cystitis 02/28/2015   Coitalgia 02/28/2015   Blood pressure elevated 02/28/2015   Calculus of kidney 02/28/2015   Arthritis or polyarthritis, rheumatoid 02/28/2015   Basal cell papilloma 02/28/2015   H/O cataract extraction 09/22/2014   Combined form of senile cataract 07/07/2014   NS (nuclear sclerosis) 05/21/2014   History of repair of hip joint 03/16/2013   H/O total knee replacement 03/16/2013   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 [place] [address] Phone: [phone] Fax: [phone]     Social Determinants of Health (SDOH) Social History: SDOH Screenings   Food Insecurity: No Food Insecurity (12/28/2022)  Housing: Low Risk  (12/28/2022)  Transportation Needs: No Transportation Needs (12/28/2022)  Utilities: Not At Risk (12/28/2022)  Alcohol Screen: Low Risk  (08/10/2020)  Depression (PHQ2-9): Low Risk  (08/10/2020)  Financial Resource Strain: Low Risk  (08/10/2020)  Physical Activity: Inactive (08/10/2020)  Social Connections: Moderately Integrated (08/10/2020)  Stress: No Stress Concern Present (08/10/2020)  Tobacco Use: Medium Risk (01/02/2023)   SDOH Interventions:     Readmission Risk Interventions    01/02/2023   12:24 PM  Readmission Risk Prevention Plan  Transportation Screening Complete  PCP or Specialist Appt within 3-5 Days Complete  Social Work Consult for Recovery Care Planning/Counseling Complete  Palliative Care Screening  Not Applicable  Medication Review Oceanographer) Complete

## 2023-01-02 NOTE — Progress Notes (Signed)
NAME:  Jeanne [name], MRN:  [id number], DOB:  [birth year], LOS: 5 ADMISSION DATE:  12/27/2022, CONSULTATION DATE:  12/28/22 REFERRING MD:  Dr. Elesa Massed, CHIEF COMPLAINT:  Diarrhea   History of Present Illness:  72 yo F presenting to [facility] ED from home for evaluation of diarrhea.  History obtained via chart review, and interview bedside with patient. Of note the patient was recently hospitalized with C.Diff from 11/09/22-11/12/22, discharged with PO vancomycin. She states she completed this antibiotic course as prescribed. She reported that she did have resolution of her symptoms for a few weeks. She also had been COVID 19 + during this admission, but was asymptomatic and did not require treatment. She reports that over the past week she has been nauseous and had poor PO intake, but denied vomiting. Over the last 4 days she has had multiple bouts of non-bloody diarrhea and bilateral lower quadrant abdominal pain/cramping. She reported these episodes seemed exactly the same in consistency and smell as during her previous C.Diff infection. She also reports generalized fatigue. She denies fever/chills, urinary symptoms, shortness of breath, productive cough, chest pain, dizziness, body aches, falls, headache or blurred vision.   ED course: Upon arrival patient met SIRS criteria with tachycardia and hypotension. Labs significant for leukocytosis without lactic acidosis, mild hyponatremia & hypoalbuminemia. Cr elevated but does appear to be at baseline since Dec 2023. Patient also COVID-19 + again, asymptomatic. Imaging revealed diffuse pan colitis. Patient fluid resuscitated but remains hypotensive, EDP initiated levophed drip and PCCM consulted for admission. Medications given: Tylenol, zofran, flagyl, 2,250 mL IVF bolus, IV contrast Initial Vitals: 99.4, 12, 117, 90/66 & 97% on RA Significant labs: (Labs/ Imaging personally reviewed) I, Cheryll Cockayne Rust-Chester, AGACNP-BC, personally viewed and interpreted  this ECG. EKG Interpretation: Date: 12/27/22, EKG Time: 21:35, Rate: 117, Rhythm: ST, QRS Axis:  borderline RAD, Intervals: normal, ST/T Wave abnormalities: none, Narrative Interpretation: ST Chemistry: Na+: 131, K+: 3.7, BUN/Cr.: 23/ 1.35, Serum CO2/ AG: 22/ 9, albumin: 2.5 Hematology: WBC: 29, Hgb: 12.8,  Lactic/ PCT: 1.3/ pending,  COVID-19 & Influenza A/B: COVID +   CT abdomen/ pelvis with contrast 12/28/22: Infectious/inflammatory pancolitis. No drainable fluid collection/abscess. No free air.  PCCM consulted for admission due to suspected sepsis with circulatory shock secondary to colitis requiring peripheral vasopressor support.  Pertinent  Medical History  HTN Cervical cancer Rheumatoid Arthritis  C.Difficile infection Former smoker- remote CKD Stage 3b  Significant Hospital Events: Including procedures, antibiotic start and stop dates in addition to other pertinent events   12/28/22: Admit to ICU with suspected sepsis & circulatory shock secondary to colitis requiring peripheral vasopressor support. 12/29/22- patient is clinically improved TR[facility] attending taking over.  PCCM will sign off and are available if needed.  4/24 PCCM asked to come back for low BP,   Interim History / Subjective:  Patient alert and oriented, marginal BP without any current complaints. Will Check LA and start stress dose steroids  Objective   Blood pressure (!) 151/117, pulse 88, temperature 97.9 F (36.6 C), temperature source Oral, resp. rate 15, height  (1.626 m), weight 83.9 kg, SpO2 100 %.        Intake/Output Summary (Last 24 hours) at 01/02/2023 1409 Last data filed at 01/02/2023 4098 Gross per 24 hour  Intake 1811.68 ml  Output 200 ml  Net 1611.68 ml    Filed Weights   12/31/22 0500 01/01/23 0715 01/02/23 0500  Weight: 71 kg 81.8 kg 83.9 kg    Examination: General: Adult  female, acutely ill, lying in bed, NAD Neuro: A&O x 4, able to follow commands, PERRL +3, MAE Pulm:  Regular, non labored on RA , breath sounds clear-BUL & clear/diminished-BLL GI: soft, rounded, mild tenderness in bilateral lower quadrants, bs x 4 Skin: limited exam- no rashes/lesions noted Extremities: warm/dry, pulses + 2 R/P, trace edema noted bilateral feet   Assessment & Plan:  Suspected Sepsis with circulatory shock secondary to pancolitis- RESOLVED  Possible Recurrent C.Diff infection LOW BP Check LA start stress dose steroids   Best Practice (right click and "Reselect all SmartList Selections" daily)  Diet/type: NPO w/ oral meds DVT prophylaxis: LMWH GI prophylaxis: PPI Lines: N/A Foley:  N/A Code Status:  full code Last date of multidisciplinary goals of care discussion [12/28/22]  Labs   CBC: Recent Labs  Lab 12/27/22 2140 12/28/22 0613 12/29/22 0410 12/30/22 0454 12/31/22 0425 01/01/23 0432 01/02/23 0406  WBC 29.0*   < > 19.1* 19.2* 16.8* 14.3* 14.1*  NEUTROABS 26.3*  --   --   --   --   --   --   HGB 12.8   < > 9.5* 9.1* 9.1* 9.4* 10.7*  HCT 40.7   < > 30.5* 29.4* 29.9* 30.7* 34.9*  MCV 88.1   < > 89.2 88.8 90.3 90.6 89.7  PLT 468*   < > 341 342 338 367 397   < > = values in this interval not displayed.     Basic Metabolic Panel: Recent Labs  Lab 12/28/22 0613 12/29/22 0410 12/30/22 0454 12/31/22 0425 01/01/23 0432 01/02/23 0406  NA 133* 135 137 139 137 137  K 4.2 3.1* 4.1 3.8 3.6 4.2  CL 105 110 116* 119* 119* 115*  CO2 18* 17* 16* 15* 14* 18*  GLUCOSE 95 102* 110* 108* 122* 110*  BUN 26*  CREATININE 1.14* 0.99 0.88 1.02* 1.24* 1.39*  CALCIUM 7.3* 7.4* 7.3* 7.3* 7.1* 7.6*  MG 1.7 2.4 2.0 1.9  --  1.8  PHOS 2.8 2.4* 1.9* 3.2  --  2.9    GFR: Estimated Creatinine Clearance: 39.5 mL/min (A) (by C-G formula based on SCr of 1.39 mg/dL (H)). Recent Labs  Lab 12/27/22 2140 12/28/22 0613 12/29/22 0410 12/30/22 0454 12/30/22 1000 12/31/22 0425 01/01/23 0432 01/02/23 0406  PROCALCITON  --  0.58  --   --   --   --   --   --    WBC 29.0* 23.9*   < > 19.2*  --  16.8* 14.3* 14.1*  LATICACIDVEN 1.3 0.8  --   --  0.9  --   --   --    < > = values in this interval not displayed.       Lucie Leather, M.D.  [facility]  Medical Director [facility] Medical Director [facility] Cardio-Pulmonary Department

## 2023-01-02 NOTE — Consult Note (Signed)
PHARMACY CONSULT NOTE  Pharmacy Consult for Electrolyte Monitoring and Replacement   Recent Labs: Potassium (mmol/L)  Date Value  01/02/2023 4.2   Magnesium (mg/dL)  Date Value  16/06/9603 1.8   Calcium (mg/dL)  Date Value  54/05/8118 7.6 (L)   Albumin (g/dL)  Date Value  14/78/2956 2.1 (L)  10/02/2019 3.9   Phosphorus (mg/dL)  Date Value  21/30/8657 2.9   Sodium (mmol/L)  Date Value  01/02/2023 137  10/02/2019 146 (H)   Corrected calcium: 9.12 mg/dL  Assessment: 72 y/o F with medical history including HTN, cervical cancer, RA, C diff infection, former smoker, CKD stage 3b admitted with circulatory shock secondary to colitis. Patient tested positive for COVID-19 infection, recently positive in 11/2022. Pharmacy consulted to assist with electrolyte monitoring and replacement as indicated.  Goal of Therapy:  Electrolytes within normal limits  Plan:  --No replacement warranted today --Follow-up electrolytes with AM labs tomorrow   Elliot Gurney, PharmD, BCPS Clinical Pharmacist  01/02/2023 7:37 AM

## 2023-01-02 NOTE — Evaluation (Signed)
Physical Therapy Evaluation Patient Details Name: Jeanne [name] MRN: [id number] DOB: 1951/04/30 Today's Date: 01/02/2023  History of Present Illness  72 y/o female recently hospitalized with C.Diff from 11/09/22-11/12/22, discharged with PO vancomycin. She states she completed this antibiotic course as prescribed. She reported that she did have resolution of her symptoms for a few weeks. She also had been COVID 19 + during this admission, but was asymptomatic and did not require treatment.  She reports that over the past week she has been nauseous and had poor PO intake, but denied vomiting. Over the last 4 days she has had multiple bouts of non-bloody diarrhea and bilateral lower quadrant abdominal pain/cramping. She reported these episodes seemed exactly the same in consistency and smell as during her previous C.Diff infection. She also reports generalized fatigue.  Clinical Impression  Pt pleasant and willing to do some limited activity with PT but ultimately feeling very weak and having pain associated with long lasting diarrhea.  She was able to do some limited LE exercises and some bed mobility but ultimately did not feel as though she had the energy and tolerance to be able to try sitting or more extensive exercises/activity.  Pt is highly motivated to try to get back home and continue working on mobility and trying to get back to walking but she has been very limited for months now with apparently only a few trips out of the home to the ortho MD, mostly using a bed pan/sponge baths for the last 6 months and walking only up to ~5 ft with a lot of help (maintaining 20% R WBing). Pt will need continued PT to address functional/mobility limitations.    Recommendations for follow up therapy are one component of a multi-disciplinary discharge planning process, led by the attending physician.  Recommendations may be updated based on patient status, additional functional criteria and insurance  authorization.  Follow Up Recommendations Can patient physically be transported by private vehicle: No     Assistance Recommended at Discharge Frequent or constant Supervision/Assistance  Patient can return home with the following  Two people to help with walking and/or transfers;A lot of help with bathing/dressing/bathroom;Assistance with cooking/housework;Assist for transportation;Help with stairs or ramp for entrance    Equipment Recommendations None recommended by PT  Recommendations for Other Services       Functional Status Assessment Patient has had a recent decline in their functional status and demonstrates the ability to make significant improvements in function in a reasonable and predictable amount of time.     Precautions / Restrictions Precautions Precautions: Fall (~6 months post TKA) Restrictions RLE Weight Bearing: Non weight bearing      Mobility  Bed Mobility Overal bed mobility: Needs Assistance Bed Mobility: Rolling, Sidelying to Sit Rolling: Mod assist, Max assist Sidelying to sit:  (pt reports too sore and fatigued after b/l rolling and BM clean up to be willing to try)       General bed mobility comments: Pt needing assist to place pillow between knees and assist to get UEs to rail and heavy assist to rotate hip/trunk - unable to maintain sidelying w/o direct assist    Transfers                   General transfer comment: pt defers    Ambulation/Gait                  Stairs            Wheelchair Mobility  Modified Rankin (Stroke Patients Only)       Balance                                             Pertinent Vitals/Pain      Home Living Family/patient expects to be discharged to:: Private residence Living Arrangements: Spouse/significant other;Children Available Help at Discharge: Family;Available 24 hours/day Type of Home: House Home Access: Ramped entrance       Home Layout: One  level Home Equipment: Agricultural consultant (2 wheels);Wheelchair - manual;Hospital bed;BSC/3in1 (home purewick)      Prior Function Prior Level of Function : Needs assist             Mobility Comments: has been working on minimal ambulation with HHPT. Short distances (5 ft max). Uses WC for long distances. Has left house once since hip surgery ADLs Comments: apparently only exclusively using bed pain since hip sx 6 months ago, family gives sponge baths in bed, has pure wick     Hand Dominance        Extremity/Trunk Assessment   Upper Extremity Assessment Upper Extremity Assessment: Generalized weakness (RA swept finger posture, limited shoulder elevation)    Lower Extremity Assessment Lower Extremity Assessment: Generalized weakness (weak and pain limited, lacking TKE, R > L swelling)       Communication   Communication: No difficulties  Cognition Arousal/Alertness: Awake/alert Behavior During Therapy: WFL for tasks assessed/performed Overall Cognitive Status: Within Functional Limits for tasks assessed                                          General Comments General comments (skin integrity, edema, etc.): Pt pleasant but functionally very limited in recent months and here today.  Struggled with rolling bed mobility/rolling and reports too much pain/soreness to be able to try sitting today    Exercises General Exercises - Lower Extremity Quad Sets: AROM, 5 reps Heel Slides: AAROM, 5 reps Hip ABduction/ADduction: AAROM, 5 reps   Assessment/Plan    PT Assessment Patient needs continued PT services  PT Problem List Decreased strength;Decreased range of motion;Decreased activity tolerance;Decreased balance;Decreased mobility;Decreased knowledge of use of DME;Decreased safety awareness;Pain       PT Treatment Interventions DME instruction;Gait training;Functional mobility training;Therapeutic activities;Therapeutic exercise;Balance training;Neuromuscular  re-education;Patient/family education;Wheelchair mobility training    PT Goals (Current goals can be found in the Care Plan section)  Acute Rehab PT Goals Patient Stated Goal: Feel better and go home PT Goal Formulation: With patient Time For Goal Achievement: 01/15/23 Potential to Achieve Goals: Good    Frequency Min 3X/week     Co-evaluation               AM-PAC PT "6 Clicks" Mobility  Outcome Measure Help needed turning from your back to your side while in a flat bed without using bedrails?: A Lot Help needed moving from lying on your back to sitting on the side of a flat bed without using bedrails?: Total Help needed moving to and from a bed to a chair (including a wheelchair)?: Total Help needed standing up from a chair using your arms (e.g., wheelchair or bedside chair)?: Total Help needed to walk in hospital room?: Total Help needed climbing 3-5 steps with a railing? : Total 6 Click  Score: 7    End of Session   Activity Tolerance: Patient limited by pain;Patient limited by fatigue Patient left: in bed;with call bell/phone within reach Nurse Communication: Mobility status;Weight bearing status PT Visit Diagnosis: Difficulty in walking, not elsewhere classified (R26.2);Muscle weakness (generalized) (M62.81);Pain Pain - Right/Left: Right Pain - part of body: Hip    Time: 0981-1914 PT Time Calculation (min) (ACUTE ONLY): 50 min   Charges:   PT Evaluation $PT Eval Moderate Complexity: 1 Mod PT Treatments $Therapeutic Exercise: 8-22 mins $Therapeutic Activity: 8-22 mins        Malachi Pro, DPT 01/02/2023, 1:45 PM

## 2023-01-03 ENCOUNTER — Inpatient Hospital Stay: Payer: Medicare HMO

## 2023-01-03 DIAGNOSIS — K5100 Ulcerative (chronic) pancolitis without complications: Secondary | ICD-10-CM | POA: Diagnosis not present

## 2023-01-03 DIAGNOSIS — N1832 Chronic kidney disease, stage 3b: Secondary | ICD-10-CM | POA: Diagnosis present

## 2023-01-03 DIAGNOSIS — I959 Hypotension, unspecified: Secondary | ICD-10-CM

## 2023-01-03 LAB — CBC WITH DIFFERENTIAL/PLATELET
Abs Immature Granulocytes: 0.2 10*3/uL — ABNORMAL HIGH (ref 0.00–0.07)
Basophils Absolute: 0 10*3/uL (ref 0.0–0.1)
Basophils Relative: 0 %
Eosinophils Absolute: 0 10*3/uL (ref 0.0–0.5)
Eosinophils Relative: 0 %
HCT: 31.6 % — ABNORMAL LOW (ref 36.0–46.0)
Hemoglobin: 9.7 g/dL — ABNORMAL LOW (ref 12.0–15.0)
Immature Granulocytes: 2 %
Lymphocytes Relative: 5 %
Lymphs Abs: 0.5 10*3/uL — ABNORMAL LOW (ref 0.7–4.0)
MCH: 27.2 pg (ref 26.0–34.0)
MCHC: 30.7 g/dL (ref 30.0–36.0)
MCV: 88.5 fL (ref 80.0–100.0)
Monocytes Absolute: 0.1 10*3/uL (ref 0.1–1.0)
Monocytes Relative: 1 %
Neutro Abs: 10.1 10*3/uL — ABNORMAL HIGH (ref 1.7–7.7)
Neutrophils Relative %: 92 %
Platelets: 361 10*3/uL (ref 150–400)
RBC: 3.57 MIL/uL — ABNORMAL LOW (ref 3.87–5.11)
RDW: 15 % (ref 11.5–15.5)
Smear Review: NORMAL
WBC: 10.9 10*3/uL — ABNORMAL HIGH (ref 4.0–10.5)
nRBC: 0 % (ref 0.0–0.2)

## 2023-01-03 LAB — GLUCOSE, CAPILLARY
Glucose-Capillary: 132 mg/dL — ABNORMAL HIGH (ref 70–99)
Glucose-Capillary: 131 mg/dL — ABNORMAL HIGH (ref 70–99)
Glucose-Capillary: 142 mg/dL — ABNORMAL HIGH (ref 70–99)

## 2023-01-03 LAB — BASIC METABOLIC PANEL
Anion gap: 5 (ref 5–15)
BUN: 28 mg/dL — ABNORMAL HIGH (ref 8–23)
CO2: 16 mmol/L — ABNORMAL LOW (ref 22–32)
Calcium: 7.6 mg/dL — ABNORMAL LOW (ref 8.9–10.3)
Chloride: 116 mmol/L — ABNORMAL HIGH (ref 98–111)
Creatinine, Ser: 1.58 mg/dL — ABNORMAL HIGH (ref 0.44–1.00)
GFR, Estimated: 35 mL/min — ABNORMAL LOW (ref >60)
Glucose, Bld: 154 mg/dL — ABNORMAL HIGH (ref 70–99)
Potassium: 4.2 mmol/L (ref 3.5–5.1)
Sodium: 137 mmol/L (ref 135–145)

## 2023-01-03 LAB — ECHOCARDIOGRAM COMPLETE
Area-P 1/2: 3.53 cm2
Height: 64 in
S' Lateral: 2.2 cm

## 2023-01-03 MED ORDER — FUROSEMIDE 10 MG/ML IJ SOLN
20.0000 mg | Freq: Once | INTRAMUSCULAR | Status: AC
  Administered 2023-01-03: 20 mg via INTRAVENOUS
  Filled 2023-01-03: qty 2

## 2023-01-03 MED ORDER — SODIUM BICARBONATE 650 MG PO TABS
650.0000 mg | ORAL_TABLET | Freq: Two times a day (BID) | ORAL | Status: AC
  Administered 2023-01-03 – 2023-01-06 (×6): 650 mg via ORAL
  Filled 2023-01-03 (×6): qty 1

## 2023-01-03 NOTE — Progress Notes (Signed)
Physical Therapy Treatment Patient Details Name: Jeanne [name] MRN: [id number] DOB: [birth year] Today's Date: 01/03/2023   History of Present Illness 72 y/o female recently hospitalized with C.Diff from 11/09/22-11/12/22, discharged with PO vancomycin. She states she completed this antibiotic course as prescribed. She reported that she did have resolution of her symptoms for a few weeks. She also had been COVID 19 + during this admission, but was asymptomatic and did not require treatment.  She reports that over the past week she has been nauseous and had poor PO intake, but denied vomiting. Over the last 4 days she has had multiple bouts of non-bloody diarrhea and bilateral lower quadrant abdominal pain/cramping. She reported these episodes seemed exactly the same in consistency and smell as during her previous C.Diff infection. She also reports generalized fatigue.    PT Comments    Pt anxious t/o the session but was willing to do some light LE exercises and with encouragement from PT and husband did agree to do some bed mobility and sit EOB.  Pt needed AAROM with most exercises and showed limited available ROM in essentially all joints.  Pt made good effort with getting up to sitting EOB but ultimately did need heavy assist to attain and maintain sitting.  Good overall effort but functionally remains quite limited.  Encouraged continued regular HEP per tolerance.  Pt will benefit from continued PT per POC.     Recommendations for follow up therapy are one component of a multi-disciplinary discharge planning process, led by the attending physician.  Recommendations may be updated based on patient status, additional functional criteria and insurance authorization.  Follow Up Recommendations  Can patient physically be transported by private vehicle: No    Assistance Recommended at Discharge Frequent or constant Supervision/Assistance  Patient can return home with the following Two people to help  with walking and/or transfers;A lot of help with bathing/dressing/bathroom;Assistance with cooking/housework;Assist for transportation;Help with stairs or ramp for entrance   Equipment Recommendations  None recommended by PT    Recommendations for Other Services       Precautions / Restrictions Precautions Precautions: Fall Restrictions Weight Bearing Restrictions: No RLE Weight Bearing: Partial weight bearing (20% per pt report)     Mobility  Bed Mobility Overal bed mobility: Needs Assistance Bed Mobility: Supine to Sit, Sit to Supine     Supine to sit: Max assist Sit to supine: Max assist   General bed mobility comments: Pt showed some effort with getting LEs toward EOB but needing at lesat AAROM and typically more PROM.  Heavy HHA (per pt request) to attain and maintain sitting.  HR up to the 140s with focused breathing and cues to remain calm she was able to tolerate ~3 minutes and HR did drop back down to 120s.    Transfers                   General transfer comment: unsafe to attempt - pt defers    Ambulation/Gait                   Stairs             Wheelchair Mobility    Modified Rankin (Stroke Patients Only)       Balance Overall balance assessment: Needs assistance Sitting-balance support: Single extremity supported, Bilateral upper extremity supported Sitting balance-Leahy Scale: Poor Sitting balance - Comments: Pt with consistent retro lean, very briefly able to hold upright with just CGA assist and b/l UE  use on bed but mostly needing at least modA HHA or modA trunk assist to maintain.                                    Cognition Arousal/Alertness: Awake/alert Behavior During Therapy: Anxious Overall Cognitive Status: Within Functional Limits for tasks assessed                                          Exercises General Exercises - Lower Extremity Ankle Circles/Pumps: AAROM, 5 reps,  Both Quad Sets: AROM, 5 reps, Both Short Arc Quad: Right, 10 reps, AROM Heel Slides: AAROM, 5 reps, Both Hip ABduction/ADduction: AAROM, 10 reps, Right    General Comments General comments (skin integrity, edema, etc.): Pt hesitant to do a lot but was willing to try some exercises and minimal mobility      Pertinent Vitals/Pain Pain Assessment Pain Assessment: Faces Pain Location: "My belly and all my arthritis"    Home Living                          Prior Function            PT Goals (current goals can now be found in the care plan section) Progress towards PT goals: Progressing toward goals    Frequency    Min 3X/week      PT Plan Current plan remains appropriate    Co-evaluation              AM-PAC PT "6 Clicks" Mobility   Outcome Measure  Help needed turning from your back to your side while in a flat bed without using bedrails?: A Lot Help needed moving from lying on your back to sitting on the side of a flat bed without using bedrails?: Total Help needed moving to and from a bed to a chair (including a wheelchair)?: Total Help needed standing up from a chair using your arms (e.g., wheelchair or bedside chair)?: Total Help needed to walk in hospital room?: Total Help needed climbing 3-5 steps with a railing? : Total 6 Click Score: 7    End of Session Equipment Utilized During Treatment: Gait belt Activity Tolerance: Patient limited by pain;Patient limited by fatigue Patient left: in bed;with call bell/phone within reach Nurse Communication: Mobility status PT Visit Diagnosis: Difficulty in walking, not elsewhere classified (R26.2);Muscle weakness (generalized) (M62.81);Pain Pain - Right/Left: Right Pain - part of body: Hip     Time: 1610-9604 PT Time Calculation (min) (ACUTE ONLY): 25 min  Charges:  $Therapeutic Exercise: 8-22 mins $Therapeutic Activity: 8-22 mins                     Malachi Pro, DPT 01/03/2023, 12:51  PM

## 2023-01-03 NOTE — Progress Notes (Addendum)
Progress Note    MADISON DIRENZO  ZOX:[id number] DOB: [birth year]  DOA: 12/27/2022 PCP: Jerl Mina, MD      Brief Narrative:    Medical records reviewed and are as summarized below:  Jeanne [name] is a 72 y.o. female with medical history significant for cervical cancer, rheumatoid arthritis, hypertension, nephrolithiasis, left UPJ urolithiasis, CKD stage IIIb. Of note the patient was recently hospitalized with C.Diff from 11/09/22-11/12/22, discharged with PO vancomycin.  She had previously been taking Bactrim at that time.  She states she completed this antibiotic course as prescribed. She reported that she did have resolution of her symptoms for a few weeks. She also had COVID 19 + during this admission, but was asymptomatic and did not require treatment.   She presented to the hospital because of diarrhea.      Assessment/Plan:   Principal Problem:   Pancolitis Active Problems:   Clostridium difficile diarrhea   COVID-19 virus infection   History of Clostridioides difficile colitis   Hypotension   CKD stage 3b, GFR 30-44 ml/min   Nutrition Problem: Inadequate oral intake Etiology: acute illness  Signs/Symptoms: per patient/family report   Body mass index is 32.85 kg/m.  (Obesity)    Recurrent C. difficile colitis, pancolitis on CT abdomen, recent C. difficile infection on 11/09/2022: Continue oral vancomycin taper.     Persistent hypotension: Improved.  IV fluids has been discontinued.  Discontinue IV steroid.  It is also suspected that low blood pressure recorded was erroneous.   CKD stage IIIb, normal anion gap metabolic acidosis: Creatinine is trending up.  Patient has CKD stage IIIb. Renal ultrasound showed bilateral renal cortical thinning with medical renal disease changes, mild bilateral hydronephrosis with, small amount of ascites and small pleural effusions.  Start oral sodium bicarbonate for metabolic acidosis. Diclofenac was discontinued on  01/02/2023.   Rheumatoid arthritis: Oxycodone as needed for pain.  Diclofenac has been discontinued because of kidney disease.   Right total hip arthroplasty in October 2023, debility: Patient said she has not been able to walk since her surgery.  She gets physical therapy at home.   Peripheral edema: She said she's had lower extremity swelling since surgery in October 2023.  However, it appears lower extremity swelling has worsened.  Suspect some of the swelling may be due to fluid overload.  Give Lasix 20 mg x 1 dose. Venous duplex done today did not show any evidence of acute DVT.  Age-indeterminate nonocclusive wall thickening/SVT involving the distal aspect of the greater saphenous vein.  2D echo was unremarkable.  EF estimated at 60 to 65%.   Right total hip arthroplasty on 07/02/2022: CT right hip has been ordered for evaluation of right hip swelling and pain.  Her husband said he had spoken to Dr. Ernestina Columbia office.  He said Dr. Leontine Locket said he can evaluate the patient if she is transferred to [facility].  Patient and her husband requested transfer to [facility].  I called Dr. Ernestina Columbia office at 11:33 AM today and left a voice message.  Awaiting callback.  I called [facility] transfer line at 3:26 PM to initiate transfer.   History of COVID-19 infection: Positive COVID test on 11/09/2022 and repeat COVID test positive on 12/28/2022.    History of nephrolithiasis and left urolithiasis.      Diet Order             Diet regular Fluid consistency: Thin  Diet effective now  Consultants: Intensivist Infectious disease specialist  Procedures: None    Medications:    (feeding supplement) PROSource Plus  30 mL Oral TID BM   vitamin C  500 mg Oral BID   cholecalciferol  1,000 Units Oral Daily   enoxaparin (LOVENOX) injection  40 mg Subcutaneous Q24H   furosemide  20 mg Intravenous Once   gabapentin  100 mg Oral QHS   Gerhardt's  butt cream   Topical Daily   midodrine  10 mg Oral TID WC   multivitamin with minerals  1 tablet Oral Daily   nutrition supplement (JUVEN)  1 packet Oral BID BM   pantoprazole (PROTONIX) IV  40 mg Intravenous Q24H   sodium bicarbonate  650 mg Oral BID   vancomycin  125 mg Oral QID   Followed by   Melene Muller ON 01/12/2023] vancomycin  125 mg Oral BID   Followed by   Melene Muller ON 01/20/2023] vancomycin  125 mg Oral Daily   Followed by   Melene Muller ON 01/27/2023] vancomycin  125 mg Oral QODAY   Followed by   Melene Muller ON 02/04/2023] vancomycin  125 mg Oral Q3 days   zinc sulfate  220 mg Oral Daily   Continuous Infusions:  sodium chloride     lactated ringers       Anti-infectives (From admission, onward)    Start     Dose/Rate Route Frequency Ordered Stop   02/04/23 1000  vancomycin (VANCOCIN) capsule 125 mg       See Hyperspace for full Linked Orders Report.   125 mg Oral Every 3 DAYS 12/31/22 1243 02/13/23 0959   01/27/23 1000  vancomycin (VANCOCIN) capsule 125 mg       See Hyperspace for full Linked Orders Report.   125 mg Oral Every other day 12/31/22 1243 02/04/23 0959   01/20/23 1000  vancomycin (VANCOCIN) capsule 125 mg       See Hyperspace for full Linked Orders Report.   125 mg Oral Daily 12/31/22 1243 01/27/23 0959   01/12/23 2200  vancomycin (VANCOCIN) capsule 125 mg       See Hyperspace for full Linked Orders Report.   125 mg Oral 2 times daily 12/31/22 1243 01/19/23 2159   12/31/22 1800  vancomycin (VANCOCIN) capsule 125 mg       See Hyperspace for full Linked Orders Report.   125 mg Oral 4 times daily 12/31/22 1243 01/12/23 1759   12/28/22 1415  vancomycin (VANCOCIN) 50 mg/mL oral solution SOLN 125 mg  Status:  Discontinued        125 mg Oral Every 6 hours 12/28/22 1323 12/31/22 1243   12/28/22 1000  metroNIDAZOLE (FLAGYL) IVPB 500 mg  Status:  Discontinued        500 mg 100 mL/hr over 60 Minutes Intravenous Every 12 hours 12/28/22 0410 12/28/22 1426   12/27/22 2345   metroNIDAZOLE (FLAGYL) IVPB 500 mg        500 mg 100 mL/hr over 60 Minutes Intravenous  Once 12/27/22 2344 12/28/22 0246              Family Communication/Anticipated D/C date and plan/Code Status   DVT prophylaxis: enoxaparin (LOVENOX) injection 40 mg Start: 12/28/22 0800 SCDs Start: 12/28/22 0259     Code Status: Full Code  Family Communication: None Disposition Plan: Plans to discharge home in 2 to 3 days   Status is: Inpatient Remains inpatient appropriate because: Recurrent C. difficile colitis and low blood pressure       Subjective:  Interval events noted.  She complains of general weakness.  She had mushy stools overnight.  Her husband was at the bedside.  Objective:    Vitals:   01/03/23 0347 01/03/23 0349 01/03/23 0851 01/03/23 1151  BP: 134/71  (!) 145/96 (!) 147/116  Pulse: 67  82 90  Resp: 16  16 16   Temp: 98.3 F (36.8 C)  98.4 F (36.9 C) 98.6 F (37 C)  TempSrc: Oral  Oral Oral  SpO2: 100%  100% 100%  Weight:  86.8 kg    Height:       No data found.   Intake/Output Summary (Last 24 hours) at 01/03/2023 1554 Last data filed at 01/03/2023 1100 Gross per 24 hour  Intake 240 ml  Output --  Net 240 ml   Filed Weights   01/01/23 0715 01/02/23 0500 01/03/23 0349  Weight: 81.8 kg 83.9 kg 86.8 kg    Exam:  GEN: NAD SKIN: Warm and dry EYES: EOMI ENT: MMM CV: RRR PULM: CTA B ABD: soft, ND, NT, +BS CNS: AAO x 3, non focal EXT: Bilateral lower extremity edema, no erythema or tenderness.  Right hip swelling with mild tenderness.  Previous right hip surgical incision has completely healed with a residual scar.  No open wounds noted on right hip.      Data Reviewed:   I have personally reviewed following labs and imaging studies:  Labs: Labs show the following:   Basic Metabolic Panel: Recent Labs  Lab 12/28/22 0613 12/29/22 0410 12/30/22 0454 12/31/22 0425 01/01/23 0432 01/02/23 0406 01/03/23 0604  NA 133* 135 137  139 137 137 137  K 4.2 3.1* 4.1 3.8 3.6 4.2 4.2  CL 105 110 116* 119* 119* 115* 116*  CO2 18* 17* 16* 15* 14* 18* 16*  GLUCOSE 95 102* 110* 108* 122* 110* 154*  BUN 21 16 12 10 20  26* 28*  CREATININE 1.14* 0.99 0.88 1.02* 1.24* 1.39* 1.58*  CALCIUM 7.3* 7.4* 7.3* 7.3* 7.1* 7.6* 7.6*  MG 1.7 2.4 2.0 1.9  --  1.8  --   PHOS 2.8 2.4* 1.9* 3.2  --  2.9  --    GFR Estimated Creatinine Clearance: 35.3 mL/min (A) (by C-G formula based on SCr of 1.58 mg/dL (H)). Liver Function Tests: Recent Labs  Lab 12/27/22 2140 12/28/22 0613  AST 15 15  ALT 7 8  ALKPHOS 114 94  BILITOT 0.8 1.1  PROT 6.5 5.6*  ALBUMIN 2.5* 2.1*   No results for input(s): "LIPASE", "AMYLASE" in the last 168 hours. No results for input(s): "AMMONIA" in the last 168 hours. Coagulation profile No results for input(s): "INR", "PROTIME" in the last 168 hours.  CBC: Recent Labs  Lab 12/27/22 2140 12/28/22 0613 12/30/22 0454 12/31/22 0425 01/01/23 0432 01/02/23 0406 01/03/23 0604  WBC 29.0*   < > 19.2* 16.8* 14.3* 14.1* 10.9*  NEUTROABS 26.3*  --   --   --   --   --  10.1*  HGB 12.8   < > 9.1* 9.1* 9.4* 10.7* 9.7*  HCT 40.7   < > 29.4* 29.9* 30.7* 34.9* 31.6*  MCV 88.1   < > 88.8 90.3 90.6 89.7 88.5  PLT 468*   < > 342 338 367 397 361   < > = values in this interval not displayed.   Cardiac Enzymes: No results for input(s): "CKTOTAL", "CKMB", "CKMBINDEX", "TROPONINI" in the last 168 hours. BNP (last 3 results) No results for input(s): "PROBNP" in the last 8760 hours. CBG:  Recent Labs  Lab 01/02/23 0749 01/02/23 1231 01/02/23 1819 01/03/23 0853 01/03/23 1153  GLUCAP 83 98 113* 132* 131*   D-Dimer: No results for input(s): "DDIMER" in the last 72 hours. Hgb A1c: No results for input(s): "HGBA1C" in the last 72 hours. Lipid Profile: No results for input(s): "CHOL", "HDL", "LDLCALC", "TRIG", "CHOLHDL", "LDLDIRECT" in the last 72 hours. Thyroid function studies: No results for input(s): "TSH",  "T4TOTAL", "T3FREE", "THYROIDAB" in the last 72 hours.  Invalid input(s): "FREET3" Anemia work up: No results for input(s): "VITAMINB12", "FOLATE", "FERRITIN", "TIBC", "IRON", "RETICCTPCT" in the last 72 hours. Sepsis Labs: Recent Labs  Lab 12/28/22 0613 12/29/22 0410 12/30/22 1000 12/31/22 0425 01/01/23 0432 01/02/23 0406 01/02/23 1417 01/02/23 1658 01/03/23 0604  PROCALCITON 0.58  --   --   --   --   --   --   --   --   WBC 23.9*   < >  --  16.8* 14.3* 14.1*  --   --  10.9*  LATICACIDVEN 0.8  --  0.9  --   --   --  1.4 1.3  --    < > = values in this interval not displayed.    Microbiology Recent Results (from the past 240 hour(s))  Resp panel by RT-PCR (RSV, Flu A&B, Covid) Anterior Nasal Swab     Status: Abnormal   Collection Time: 12/28/22 12:20 AM   Specimen: Anterior Nasal Swab  Result Value Ref Range Status   SARS Coronavirus 2 by RT PCR POSITIVE (A) NEGATIVE Final    Comment: (NOTE) SARS-CoV-2 target nucleic acids are DETECTED.  The SARS-CoV-2 RNA is generally detectable in upper respiratory specimens during the acute phase of infection. Positive results are indicative of the presence of the identified virus, but do not rule out bacterial infection or co-infection with other pathogens not detected by the test. Clinical correlation with patient history and other diagnostic information is necessary to determine patient infection status. The expected result is Negative.  Fact Sheet for Patients: BloggerCourse.com  Fact Sheet for Healthcare Providers: SeriousBroker.it  This test is not yet approved or cleared by the [place] FDA and  has been authorized for detection and/or diagnosis of SARS-CoV-2 by FDA under an Emergency Use Authorization (EUA).  This EUA will remain in effect (meaning this test can be used) for the duration of  the COVID-19 declaration under Section 564(b)(1) of the A ct, 21 U.S.C.  section 360bbb-3(b)(1), unless the authorization is terminated or revoked sooner.     Influenza A by PCR NEGATIVE NEGATIVE Final   Influenza B by PCR NEGATIVE NEGATIVE Final    Comment: (NOTE) The Xpert Xpress SARS-CoV-2/FLU/RSV plus assay is intended as an aid in the diagnosis of influenza from Nasopharyngeal swab specimens and should not be used as a sole basis for treatment. Nasal washings and aspirates are unacceptable for Xpert Xpress SARS-CoV-2/FLU/RSV testing.  Fact Sheet for Patients: BloggerCourse.com  Fact Sheet for Healthcare Providers: SeriousBroker.it  This test is not yet approved or cleared by the [place] FDA and has been authorized for detection and/or diagnosis of SARS-CoV-2 by FDA under an Emergency Use Authorization (EUA). This EUA will remain in effect (meaning this test can be used) for the duration of the COVID-19 declaration under Section 564(b)(1) of the Act, 21 U.S.C. section 360bbb-3(b)(1), unless the authorization is terminated or revoked.     Resp Syncytial Virus by PCR NEGATIVE NEGATIVE Final    Comment: (NOTE) Fact Sheet for Patients: BloggerCourse.com  Fact Sheet for Healthcare Providers: SeriousBroker.it  This test is not yet approved or cleared by the [place] FDA and has been authorized for detection and/or diagnosis of SARS-CoV-2 by FDA under an Emergency Use Authorization (EUA). This EUA will remain in effect (meaning this test can be used) for the duration of the COVID-19 declaration under Section 564(b)(1) of the Act, 21 U.S.C. section 360bbb-3(b)(1), unless the authorization is terminated or revoked.  Performed at [facility], [address] [place], [place] [address]   Blood Culture (routine x 2)     Status: Abnormal   Collection Time: 12/28/22 12:20 AM   Specimen: BLOOD  Result Value Ref Range Status    Specimen Description   Final    BLOOD  LEFT [facility] Performed at [facility], [address] [place], [place] [address]    Special Requests   Final    BOTTLES DRAWN AEROBIC AND ANAEROBIC Blood Culture adequate volume Performed at [facility], [address] [place], [place] [address]    Culture  Setup Time   Final    GRAM POSITIVE COCCI ANAEROBIC BOTTLE ONLY Organism ID to follow CRITICAL RESULT CALLED TO, READ BACK BY AND VERIFIED WITH: NATHAN BELUE @ 2204 12/28/22 LFD Performed at [facility], [address] [place], [place] [address]    Culture (A)  Final    STAPHYLOCOCCUS EPIDERMIDIS THE SIGNIFICANCE OF ISOLATING THIS ORGANISM FROM A SINGLE SET OF BLOOD CULTURES WHEN MULTIPLE SETS ARE DRAWN IS UNCERTAIN. PLEASE NOTIFY THE MICROBIOLOGY DEPARTMENT WITHIN ONE WEEK IF SPECIATION AND SENSITIVITIES ARE REQUIRED. Performed at [facility], [address] [name], [place] [address]    Report Status 12/31/2022 FINAL  Final  Blood Culture ID Panel (Reflexed)     Status: Abnormal   Collection Time: 12/28/22 12:20 AM  Result Value Ref Range Status   Enterococcus faecalis NOT DETECTED NOT DETECTED Final   Enterococcus Faecium NOT DETECTED NOT DETECTED Final   Listeria monocytogenes NOT DETECTED NOT DETECTED Final   Staphylococcus species DETECTED (A) NOT DETECTED Final    Comment: CRITICAL RESULT CALLED TO, READ BACK BY AND VERIFIED WITH: NATHAN BELUE @ 2204 12/28/22 LFD    Staphylococcus aureus (BCID) NOT DETECTED NOT DETECTED Final   Staphylococcus epidermidis DETECTED (A) NOT DETECTED Final    Comment: Methicillin (oxacillin) resistant coagulase negative staphylococcus. Possible blood culture contaminant (unless isolated from more than one blood culture draw or clinical case suggests pathogenicity). No antibiotic treatment is indicated for blood  culture contaminants. CRITICAL RESULT CALLED TO, READ BACK BY AND VERIFIED WITH: NATHAN BELUE @ 2204  12/28/22 LFD    Staphylococcus lugdunensis NOT DETECTED NOT DETECTED Final   Streptococcus species NOT DETECTED NOT DETECTED Final   Streptococcus agalactiae NOT DETECTED NOT DETECTED Final   Streptococcus pneumoniae NOT DETECTED NOT DETECTED Final   Streptococcus pyogenes NOT DETECTED NOT DETECTED Final   A.calcoaceticus-baumannii NOT DETECTED NOT DETECTED Final   Bacteroides fragilis NOT DETECTED NOT DETECTED Final   Enterobacterales NOT DETECTED NOT DETECTED Final   Enterobacter cloacae complex NOT DETECTED NOT DETECTED Final   Escherichia coli NOT DETECTED NOT DETECTED Final   Klebsiella aerogenes NOT DETECTED NOT DETECTED Final   Klebsiella oxytoca NOT DETECTED NOT DETECTED Final   Klebsiella pneumoniae NOT DETECTED NOT DETECTED Final   Proteus species NOT DETECTED NOT DETECTED Final   Salmonella species NOT DETECTED NOT DETECTED Final   Serratia marcescens NOT DETECTED NOT DETECTED Final   Haemophilus influenzae NOT  DETECTED NOT DETECTED Final   Neisseria meningitidis NOT DETECTED NOT DETECTED Final   Pseudomonas aeruginosa NOT DETECTED NOT DETECTED Final   Stenotrophomonas maltophilia NOT DETECTED NOT DETECTED Final   Candida albicans NOT DETECTED NOT DETECTED Final   Candida auris NOT DETECTED NOT DETECTED Final   Candida glabrata NOT DETECTED NOT DETECTED Final   Candida krusei NOT DETECTED NOT DETECTED Final   Candida parapsilosis NOT DETECTED NOT DETECTED Final   Candida tropicalis NOT DETECTED NOT DETECTED Final   Cryptococcus neoformans/gattii NOT DETECTED NOT DETECTED Final   Methicillin resistance mecA/C DETECTED (A) NOT DETECTED Final    Comment: CRITICAL RESULT CALLED TO, READ BACK BY AND VERIFIED WITH: NATHAN BELUE @ 2204 12/28/22 LFD Performed at [facility], [address] [place], [place] [address]   Blood Culture (routine x 2)     Status: None   Collection Time: 12/28/22  1:06 AM   Specimen: BLOOD  Result Value Ref Range Status   Specimen  Description BLOOD RIGHT FOREARM  Final   Special Requests   Final    BOTTLES DRAWN AEROBIC AND ANAEROBIC Blood Culture results may not be optimal due to an inadequate volume of blood received in culture bottles   Culture   Final    NO GROWTH 5 DAYS Performed at [facility], [address] [place], [place] [address]    Report Status 01/02/2023 FINAL  Final  C Difficile Quick Screen w PCR reflex     Status: Abnormal   Collection Time: 12/29/22  1:01 PM   Specimen: STOOL  Result Value Ref Range Status   C Diff antigen POSITIVE (A) NEGATIVE Final   C Diff toxin POSITIVE (A) NEGATIVE Final   C Diff interpretation Toxin producing C. difficile detected.  Final    Comment: CRITICAL RESULT CALLED TO, READ BACK BY AND VERIFIED WITH: KATIE CLAYTON AT 1432 12/29/22.PMF Performed at [facility], [address] [place], [place] [address]   Urine Culture     Status: Abnormal   Collection Time: 12/30/22 11:43 PM   Specimen: Urine, Random  Result Value Ref Range Status   Specimen Description   Final    URINE, RANDOM Performed at [facility], [address] [place], [place] [address]    Special Requests   Final    NONE Reflexed from [id number] Performed at [facility], [address] [place], [place] [address]    Culture (A)  Final    >=100,000 COLONIES/mL KLEBSIELLA OXYTOCA 70,000 COLONIES/mL ENTEROCOCCUS FAECALIS Confirmed Extended Spectrum Beta-Lactamase Producer (ESBL).  In bloodstream infections from ESBL organisms, carbapenems are preferred over piperacillin/tazobactam. They are shown to have a lower risk of mortality.    Report Status 01/02/2023 FINAL  Final   Organism ID, Bacteria KLEBSIELLA OXYTOCA (A)  Final   Organism ID, Bacteria ENTEROCOCCUS FAECALIS (A)  Final      Susceptibility   Enterococcus faecalis - MIC*    AMPICILLIN <=2 SENSITIVE Sensitive     NITROFURANTOIN <=16 SENSITIVE Sensitive     VANCOMYCIN 1 SENSITIVE Sensitive     *  70,000 COLONIES/mL ENTEROCOCCUS FAECALIS   Klebsiella oxytoca - MIC*    AMPICILLIN >=32 RESISTANT Resistant     CEFEPIME >=32 RESISTANT Resistant     CEFTRIAXONE >=64 RESISTANT Resistant     CIPROFLOXACIN 1 RESISTANT Resistant     GENTAMICIN >=16 RESISTANT Resistant     IMIPENEM <=0.25 SENSITIVE Sensitive     NITROFURANTOIN 32 SENSITIVE Sensitive  TRIMETH/SULFA >=320 RESISTANT Resistant     AMPICILLIN/SULBACTAM >=32 RESISTANT Resistant     PIP/TAZO <=4 SENSITIVE Sensitive     * >=100,000 COLONIES/mL KLEBSIELLA OXYTOCA  MRSA Next Gen by PCR, Nasal     Status: None   Collection Time: 12/31/22 12:46 AM   Specimen: Nasal Mucosa; Nasal Swab  Result Value Ref Range Status   MRSA by PCR Next Gen NOT DETECTED NOT DETECTED Final    Comment: (NOTE) The GeneXpert MRSA Assay (FDA approved for NASAL specimens only), is one component of a comprehensive MRSA colonization surveillance program. It is not intended to diagnose MRSA infection nor to guide or monitor treatment for MRSA infections. Test performance is not FDA approved in patients less than 88 years old. Performed at [facility], [address] [place], [place] [address]     Procedures and diagnostic studies:  US Venous Img Lower Bilateral (DVT)  Result Date: 01/03/2023 CLINICAL DATA:  Bilateral lower extremity pain and edema. Remote history of cervical cancer. Evaluate for DVT. EXAM: BILATERAL LOWER EXTREMITY VENOUS DOPPLER ULTRASOUND TECHNIQUE: Gray-scale sonography with graded compression, as well as color Doppler and duplex ultrasound were performed to evaluate the lower extremity deep venous systems from the level of the common femoral vein and including the common femoral, femoral, profunda femoral, popliteal and calf veins including the posterior tibial, peroneal and gastrocnemius veins when visible. The superficial great saphenous vein was also interrogated. Spectral Doppler was utilized to evaluate flow at rest and  with distal augmentation maneuvers in the common femoral, femoral and popliteal veins. COMPARISON:  None Available. FINDINGS: RIGHT LOWER EXTREMITY Common Femoral Vein: No evidence of thrombus. Normal compressibility, respiratory phasicity and response to augmentation. Saphenofemoral Junction: No evidence of thrombus. Normal compressibility and flow on color Doppler imaging. Profunda Femoral Vein: No evidence of thrombus. Normal compressibility and flow on color Doppler imaging. Femoral Vein: No evidence of thrombus. Normal compressibility, respiratory phasicity and response to augmentation. Popliteal Vein: No evidence of thrombus. Normal compressibility, respiratory phasicity and response to augmentation. Calf Veins: No evidence of thrombus. Normal compressibility and flow on color Doppler imaging. Superficial Great Saphenous Vein: There is age-indeterminate nonocclusive wall thickening/DVT involving the distal aspect of the greater saphenous vein (images 40 and 43). Other Findings: There is a minimal amount of subcutaneous edema at the level of the right lower leg and calf. LEFT LOWER EXTREMITY Common Femoral Vein: No evidence of thrombus. Normal compressibility, respiratory phasicity and response to augmentation. Saphenofemoral Junction: No evidence of thrombus. Normal compressibility and flow on color Doppler imaging. Profunda Femoral Vein: No evidence of thrombus. Normal compressibility and flow on color Doppler imaging. Femoral Vein: No evidence of thrombus. Normal compressibility, respiratory phasicity and response to augmentation. Popliteal Vein: No evidence of thrombus. Normal compressibility, respiratory phasicity and response to augmentation. Calf Veins: No evidence of thrombus. Normal compressibility and flow on color Doppler imaging. Superficial Great Saphenous Vein: No evidence of thrombus. Normal compressibility. Other Findings: There is a minimal amount of subcutaneous edema at the level of the left  calf. IMPRESSION: 1. No evidence of DVT within either lower extremity. 2. Age-indeterminate, though potentially chronic, nonocclusive wall thickening/SVT involving the distal aspect of the right greater saphenous vein. Clinical correlation is advised. Electronically Signed   By: Simonne Come M.D.   On: 01/03/2023 15:00   US RENAL  Result Date: 01/03/2023 CLINICAL DATA:  Acute kidney injury, history hypertension, cervical cancer EXAM: RENAL / URINARY TRACT ULTRASOUND COMPLETE COMPARISON:  CT abdomen and pelvis  12/28/2022 FINDINGS: Right Kidney: Renal measurements: 10.5 x 5.2 x 4.6 cm = volume: 132 mL. Marked cortical thinning. Upper normal cortical echogenicity. Mildly dilated renal collecting system. No mass or shadowing calcification. Left Kidney: Renal measurements: 10.0 x 5.3 x 4.6 cm = volume: 129 mL. Marked cortical thinning. Increased cortical echogenicity. Mild hydronephrosis. No definite mass or shadowing calcification. Bladder: Appears normal for degree of bladder distention. Other: Small amount of free fluid and small BILATERAL pleural effusions incidentally noted. Cholelithiasis. IMPRESSION: BILATERAL renal cortical thinning and medical renal disease changes. Mild BILATERAL hydronephrosis. Small amount of ascites and small pleural effusions. Electronically Signed   By: Ulyses Southward M.D.   On: 01/03/2023 08:08   ECHOCARDIOGRAM COMPLETE  Result Date: 01/03/2023    ECHOCARDIOGRAM REPORT   Patient Name:   Jeanne [name] Date of Exam: 01/02/2023 Medical Rec #:  [id number]        Height:       64.0 in Accession #:    [id number]       Weight:       185.0 lb Date of Birth:  [birth year]        BSA:          1.892 m Patient Age:    72 years         BP:           90/66 mmHg Patient Gender: F                HR:           85 bpm. Exam Location:  [facility] Procedure: 2D Echo, Cardiac Doppler and Color Doppler Indications:     I50.9 CHF  History:         Patient has no prior history of Echocardiogram examinations.                   Risk Factors:Hypertension. Chronic kidney disease.  Sonographer:     Daphine Deutscher RDCS Referring Phys:  [id number] Lurene Shadow Diagnosing Phys: Julien Nordmann MD IMPRESSIONS  1. Left ventricular ejection fraction, by estimation, is 60 to 65%. The left ventricle has normal function. The left ventricle has no regional wall motion abnormalities. Left ventricular diastolic parameters were normal.  2. Right ventricular systolic function is normal. The right ventricular size is normal. There is normal pulmonary artery systolic pressure. The estimated right ventricular systolic pressure is 26.5 mmHg.  3. The mitral valve is normal in structure. Mild mitral valve regurgitation. No evidence of mitral stenosis.  4. The aortic valve has an indeterminant number of cusps. Aortic valve regurgitation is not visualized. No aortic stenosis is present.  5. The inferior vena cava is normal in size with greater than 50% respiratory variability, suggesting right atrial pressure of 3 mmHg. FINDINGS  Left Ventricle: Left ventricular ejection fraction, by estimation, is 60 to 65%. The left ventricle has normal function. The left ventricle has no regional wall motion abnormalities. The left ventricular internal cavity size was normal in size. There is  no left ventricular hypertrophy. Left ventricular diastolic parameters were normal. Right Ventricle: The right ventricular size is normal. No increase in right ventricular wall thickness. Right ventricular systolic function is normal. There is normal pulmonary artery systolic pressure. The tricuspid regurgitant velocity is 2.32 m/s, and  with an assumed right atrial pressure of 5 mmHg, the estimated right ventricular systolic pressure is 26.5 mmHg. Left Atrium: Left atrial size was normal in size. Right Atrium: Right atrial size was normal in  size. Pericardium: There is no evidence of pericardial effusion. Mitral Valve: The mitral valve is normal in structure. Mild mitral  valve regurgitation. No evidence of mitral valve stenosis. Tricuspid Valve: The tricuspid valve is normal in structure. Tricuspid valve regurgitation is not demonstrated. No evidence of tricuspid stenosis. Aortic Valve: The aortic valve has an indeterminant number of cusps. Aortic valve regurgitation is not visualized. No aortic stenosis is present. Pulmonic Valve: The pulmonic valve was normal in structure. Pulmonic valve regurgitation is not visualized. No evidence of pulmonic stenosis. Aorta: The aortic root is normal in size and structure. Venous: The inferior vena cava is normal in size with greater than 50% respiratory variability, suggesting right atrial pressure of 3 mmHg. IAS/Shunts: No atrial level shunt detected by color flow Doppler.  LEFT VENTRICLE PLAX 2D LVIDd:         3.90 cm   Diastology LVIDs:         2.20 cm   LV e' medial:    9.70 cm/s LV PW:         0.70 cm   LV E/e' medial:  9.4 LV IVS:        0.90 cm   LV e' lateral:   8.33 cm/s LVOT diam:     1.60 cm   LV E/e' lateral: 10.9 LV SV:         61 LV SV Index:   32 LVOT Area:     2.01 cm  RIGHT VENTRICLE             IVC RV Basal diam:  4.40 cm     IVC diam: 1.20 cm RV S prime:     15.30 cm/s TAPSE (M-mode): 3.0 cm LEFT ATRIUM             Index        RIGHT ATRIUM           Index LA diam:        3.50 cm 1.85 cm/m   RA Area:     13.10 cm LA Vol (A2C):   22.2 ml 11.73 ml/m  RA Volume:   32.70 ml  17.28 ml/m LA Vol (A4C):   24.9 ml 13.16 ml/m LA Biplane Vol: 24.2 ml 12.79 ml/m  AORTIC VALVE LVOT Vmax:   134.00 cm/s LVOT Vmean:  93.200 cm/s LVOT VTI:    0.305 m  AORTA Ao Root diam: 3.60 cm MITRAL VALVE               TRICUSPID VALVE MV Area (PHT): 3.53 cm    TR Peak grad:   21.5 mmHg MV Decel Time: 215 msec    TR Vmax:        232.00 cm/s MV E velocity: 90.70 cm/s MV A velocity: 85.40 cm/s  SHUNTS MV E/A ratio:  1.06        Systemic VTI:  0.30 m                            Systemic Diam: 1.60 cm Julien Nordmann MD Electronically signed by Julien Nordmann MD Signature Date/Time: 01/03/2023/7:54:11 AM    Final                LOS: 6 days   Lurene Shadow  [facility]   Pager on [email or web address]. If 7PM-7AM, please contact night-coverage at [email or web address]     01/03/2023, 3:54 PM

## 2023-01-03 NOTE — TOC Progression Note (Signed)
Transition of Care [facility]) - Progression Note    Patient Details  Name: Jeanne [name] MRN: [id number] Date of Birth: 20-Jun-1951  [facility] of [facility]) CM/SW Contact  Margarito Liner, LCSW Phone Number: 01/03/2023, 9:38 AM  Clinical Narrative:  Called patient in the room. She confirmed she does prefer to return home with home health. CSW notified her that [facility] is able to add OT, RN, aide. CSW has notified the [facility] liaison.   Expected Discharge Plan: Home/Self Care Barriers to Discharge: Continued Medical Work up  Expected Discharge Plan and Services     Post Acute Care Choice: NA Living arrangements for the past 2 months: Single Family Home                                       Social Determinants of Health (SDOH) Interventions SDOH Screenings   Food Insecurity: No Food Insecurity (12/28/2022)  Housing: Low Risk  (12/28/2022)  Transportation Needs: No Transportation Needs (12/28/2022)  Utilities: Not At Risk (12/28/2022)  Alcohol Screen: Low Risk  (08/10/2020)  Depression (PHQ2-9): Low Risk  (08/10/2020)  Financial Resource Strain: Low Risk  (08/10/2020)  Physical Activity: Inactive (08/10/2020)  Social Connections: Moderately Integrated (08/10/2020)  Stress: No Stress Concern Present (08/10/2020)  Tobacco Use: Medium Risk (01/02/2023)    Readmission Risk Interventions    01/02/2023   12:24 PM  Readmission Risk Prevention Plan  Transportation Screening Complete  PCP or Specialist Appt within 3-5 Days Complete  Social Work Consult for Recovery Care Planning/Counseling Complete  Palliative Care Screening Not Applicable  Medication Review Oceanographer) Complete

## 2023-01-03 NOTE — Plan of Care (Signed)

## 2023-01-04 DIAGNOSIS — K5100 Ulcerative (chronic) pancolitis without complications: Secondary | ICD-10-CM | POA: Diagnosis not present

## 2023-01-04 DIAGNOSIS — A0472 Enterocolitis due to Clostridium difficile, not specified as recurrent: Secondary | ICD-10-CM | POA: Diagnosis not present

## 2023-01-04 DIAGNOSIS — N179 Acute kidney failure, unspecified: Secondary | ICD-10-CM | POA: Diagnosis not present

## 2023-01-04 DIAGNOSIS — I959 Hypotension, unspecified: Secondary | ICD-10-CM | POA: Diagnosis not present

## 2023-01-04 LAB — BASIC METABOLIC PANEL WITH GFR
Anion gap: 4 — ABNORMAL LOW (ref 5–15)
BUN: 34 mg/dL — ABNORMAL HIGH (ref 8–23)
CO2: 17 mmol/L — ABNORMAL LOW (ref 22–32)
Calcium: 7.7 mg/dL — ABNORMAL LOW (ref 8.9–10.3)
Chloride: 115 mmol/L — ABNORMAL HIGH (ref 98–111)
Creatinine, Ser: 1.88 mg/dL — ABNORMAL HIGH (ref 0.44–1.00)
GFR, Estimated: 28 mL/min — ABNORMAL LOW
Glucose, Bld: 151 mg/dL — ABNORMAL HIGH (ref 70–99)
Potassium: 4.3 mmol/L (ref 3.5–5.1)
Sodium: 136 mmol/L (ref 135–145)

## 2023-01-04 LAB — HEPATIC FUNCTION PANEL
ALT: 9 U/L (ref 0–44)
AST: 17 U/L (ref 15–41)
Albumin: 2.1 g/dL — ABNORMAL LOW (ref 3.5–5.0)
Alkaline Phosphatase: 86 U/L (ref 38–126)
Bilirubin, Direct: <0.1 mg/dL (ref 0.0–0.2)
Total Bilirubin: 0.5 mg/dL (ref 0.3–1.2)
Total Protein: 4.9 g/dL — ABNORMAL LOW (ref 6.5–8.1)

## 2023-01-04 LAB — GLUCOSE, CAPILLARY: Glucose-Capillary: 111 mg/dL — ABNORMAL HIGH (ref 70–99)

## 2023-01-04 MED ORDER — FUROSEMIDE 10 MG/ML IJ SOLN
40.0000 mg | Freq: Every day | INTRAMUSCULAR | Status: DC
  Administered 2023-01-05 – 2023-01-15 (×11): 40 mg via INTRAVENOUS
  Filled 2023-01-04 (×11): qty 4

## 2023-01-04 MED ORDER — MIDODRINE HCL 5 MG PO TABS
5.0000 mg | ORAL_TABLET | Freq: Three times a day (TID) | ORAL | Status: DC
  Administered 2023-01-04 – 2023-01-19 (×44): 5 mg via ORAL
  Filled 2023-01-04 (×43): qty 1

## 2023-01-04 NOTE — Progress Notes (Addendum)
Progress Note    TANDI HANKO  ONG:[id number] DOB: November 03, 1950  DOA: 12/27/2022 PCP: Jerl Mina, MD      Brief Narrative:    Medical records reviewed and are as summarized below:  Jeanne [name] is a 72 y.o. female with medical history significant for cervical cancer, rheumatoid arthritis, hypertension, nephrolithiasis, left UPJ urolithiasis, CKD stage IIIb. Of note the patient was recently hospitalized with C.Diff from 11/09/22-11/12/22, discharged with PO vancomycin.  She had previously been taking Bactrim at that time.  She states she completed this antibiotic course as prescribed. She reported that she did have resolution of her symptoms for a few weeks. She also had COVID 19 + during this admission, but was asymptomatic and did not require treatment.   She presented to the hospital because of diarrhea.      Assessment/Plan:   Principal Problem:   Pancolitis (HCC) Active Problems:   Clostridium difficile diarrhea   COVID-19 virus infection   AKI (acute kidney injury) (HCC)   History of Clostridioides difficile colitis   Hypotension   CKD stage 3b, GFR 30-44 ml/min (HCC)   Nutrition Problem: Inadequate oral intake Etiology: acute illness  Signs/Symptoms: per patient/family report   Body mass index is 33.26 kg/m.  (Obesity)    Recurrent C. difficile colitis, pancolitis on CT abdomen, recent C. difficile infection on 11/09/2022: Diarrhea is improving.  Continue oral vancomycin taper.   Persistent hypotension: Improved.  Decrease midodrine from 10 to 5 mg 3 times daily.  S/p 2 days of IV hydrocortisone from 01/01/2022.  It is also suspected that low blood pressure recorded on 01/02/2023 was erroneous.   AKI on CKD stage IIIb, normal anion gap metabolic acidosis: She has adequate urine output but creatinine is rising.  Consulted Dr. Wynelle Link, nephrologist, to assist with management.   Renal ultrasound showed bilateral renal cortical thinning with medical renal  disease changes, mild bilateral hydronephrosis with, small amount of ascites and small pleural effusions.  Continue oral sodium bicarbonate for metabolic acidosis. Diclofenac was discontinued on 01/02/2023.   Rheumatoid arthritis: Oxycodone as needed for pain.  Diclofenac was discontinued on 01/02/2023    Peripheral edema: She said she's had lower extremity swelling since surgery in October 2023.  However, it appears lower extremity swelling has worsened.  Suspect some of the swelling may be due to fluid overload.  S/p IV Lasix 20 mg x 1 dose on 01/03/2023. Venous duplex done today did not show any evidence of acute DVT.  Age-indeterminate nonocclusive wall thickening/SVT involving the distal aspect of the greater saphenous vein.  2D echo was unremarkable.  EF estimated at 60 to 65%.   Right total hip arthroplasty on 07/02/2022: Patient says she has not been able to walk since her surgery.  She gets physical therapy at home. CT right hip did not show any acute fracture.  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 Attempt to transfer patient to [facility] was able to schedule.  I called the [facility] transfer line on 01/03/2023.  I was told that [facility] is at capacity and cannot accept transfer  to the medical team and also there was no medical reason for patient to be transferred to [facility]..  Since there is no acute orthopedic issue, patient cannot be transferred to the orthopedic service.  Outpatient follow-up with Dr. Leontine Locket after discharge from the  hospital.   History of COVID-19 infection: Positive COVID test on 11/09/2022 and repeat COVID test positive on 12/28/2022.    History of nephrolithiasis and left urolithiasis.    Plan of care was discussed with her husband at the bedside.   Diet Order             Diet regular  Fluid consistency: Thin  Diet effective now                            Consultants: Intensivist Infectious disease specialist Nephrologist  Procedures: None    Medications:    (feeding supplement) PROSource Plus  30 mL Oral TID BM   vitamin C  500 mg Oral BID   cholecalciferol  1,000 Units Oral Daily   enoxaparin (LOVENOX) injection  40 mg Subcutaneous Q24H   gabapentin  100 mg Oral QHS   Gerhardt's butt cream   Topical Daily   midodrine  5 mg Oral TID WC   multivitamin with minerals  1 tablet Oral Daily   nutrition supplement (JUVEN)  1 packet Oral BID BM   sodium bicarbonate  650 mg Oral BID   vancomycin  125 mg Oral QID   Followed by   Melene Muller ON 01/12/2023] vancomycin  125 mg Oral BID   Followed by   Melene Muller ON 01/20/2023] vancomycin  125 mg Oral Daily   Followed by   Melene Muller ON 01/27/2023] vancomycin  125 mg Oral QODAY   Followed by   Melene Muller ON 02/04/2023] vancomycin  125 mg Oral Q3 days   zinc sulfate  220 mg Oral Daily   Continuous Infusions:  sodium chloride       Anti-infectives (From admission, onward)    Start     Dose/Rate Route Frequency Ordered Stop   02/04/23 1000  vancomycin (VANCOCIN) capsule 125 mg       See Hyperspace for full Linked Orders Report.   125 mg Oral Every 3 DAYS 12/31/22 1243 02/13/23 0959   01/27/23 1000  vancomycin (VANCOCIN) capsule 125 mg       See Hyperspace for full Linked Orders Report.   125 mg Oral Every other day 12/31/22 1243 02/04/23 0959   01/20/23 1000  vancomycin (VANCOCIN) capsule 125 mg       See Hyperspace for full Linked Orders Report.   125 mg Oral Daily 12/31/22 1243 01/27/23 0959   01/12/23 2200  vancomycin (VANCOCIN) capsule 125 mg       See Hyperspace for full Linked Orders Report.   125 mg Oral 2 times daily 12/31/22 1243 01/19/23 2159   12/31/22 1800  vancomycin (VANCOCIN) capsule 125 mg       See Hyperspace for full Linked Orders Report.   125 mg Oral 4 times daily 12/31/22 1243 01/12/23 1759    12/28/22 1415  vancomycin (VANCOCIN) 50 mg/mL oral solution SOLN 125 mg  Status:  Discontinued        125 mg Oral Every 6 hours 12/28/22 1323 12/31/22 1243   12/28/22 1000  metroNIDAZOLE (FLAGYL) IVPB 500 mg  Status:  Discontinued        500 mg 100 mL/hr over 60 Minutes Intravenous Every 12 hours 12/28/22 0410 12/28/22 1426   12/27/22 2345  metroNIDAZOLE (FLAGYL) IVPB 500 mg        500 mg 100 mL/hr over 60 Minutes Intravenous  Once 12/27/22 2344 12/28/22 0246  Family Communication/Anticipated D/C date and plan/Code Status   DVT prophylaxis: enoxaparin (LOVENOX) injection 40 mg Start: 12/28/22 0800 SCDs Start: 12/28/22 0259     Code Status: Full Code  Family Communication: Plan discussed with her husband at the bedside Disposition Plan: Plans to discharge home in 2 to 3 days   Status is: Inpatient Remains inpatient appropriate because: Recurrent C. difficile colitis and low blood pressure       Subjective:   Interval events noted.  No diarrhea.  She still feels weak.  She has adequate urine output.  Her husband was at the bedside  Objective:    Vitals:   01/03/23 2331 01/04/23 0404 01/04/23 0500 01/04/23 0732  BP: 135/73 134/79  138/71  Pulse: 70 (!) 51  65  Resp: 16 18  17   Temp: 98.3 F (36.8 C) (!) 97.5 F (36.4 C)  97.9 F (36.6 C)  TempSrc: Oral   Oral  SpO2: 100% 99%  100%  Weight:   87.9 kg   Height:       No data found.   Intake/Output Summary (Last 24 hours) at 01/04/2023 1401 Last data filed at 01/03/2023 2147 Gross per 24 hour  Intake 1120 ml  Output 100 ml  Net 1020 ml   Filed Weights   01/02/23 0500 01/03/23 0349 01/04/23 0500  Weight: 83.9 kg 86.8 kg 87.9 kg    Exam:   GEN: NAD SKIN: Warm and dry EYES: No pallor no icterus ENT: MMM CV: RRR PULM: No wheezing or rales ABD: soft, obese, NT, +BS CNS: AAO x 3, non focal EXT: Significant bilateral lower extremity edema from the thighs to the feet.  Mild right  hip tenderness.  No erythema.   Data Reviewed:   I have personally reviewed following labs and imaging studies:  Labs: Labs show the following:   Basic Metabolic Panel: Recent Labs  Lab 12/29/22 0410 12/30/22 0454 12/31/22 0425 01/01/23 0432 01/02/23 0406 01/03/23 0604 01/04/23 0320  NA 135 137 139 137 137 137 136  K 3.1* 4.1 3.8 3.6 4.2 4.2 4.3  CL 110 116* 119* 119* 115* 116* 115*  CO2 17* 16* 15* 14* 18* 16* 17*  GLUCOSE 102* 110* 108* 122* 110* 154* 151*  BUN 16 12 10 20  26* 28* 34*  CREATININE 0.99 0.88 1.02* 1.24* 1.39* 1.58* 1.88*  CALCIUM 7.4* 7.3* 7.3* 7.1* 7.6* 7.6* 7.7*  MG 2.4 2.0 1.9  --  1.8  --   --   PHOS 2.4* 1.9* 3.2  --  2.9  --   --    GFR Estimated Creatinine Clearance: 29.9 mL/min (A) (by C-G formula based on SCr of 1.88 mg/dL (H)). Liver Function Tests: Recent Labs  Lab 01/04/23 0320  AST 17  ALT 9  ALKPHOS 86  BILITOT 0.5  PROT 4.9*  ALBUMIN 2.1*   No results for input(s): "LIPASE", "AMYLASE" in the last 168 hours. No results for input(s): "AMMONIA" in the last 168 hours. Coagulation profile No results for input(s): "INR", "PROTIME" in the last 168 hours.  CBC: Recent Labs  Lab 12/30/22 0454 12/31/22 0425 01/01/23 0432 01/02/23 0406 01/03/23 0604  WBC 19.2* 16.8* 14.3* 14.1* 10.9*  NEUTROABS  --   --   --   --  10.1*  HGB 9.1* 9.1* 9.4* 10.7* 9.7*  HCT 29.4* 29.9* 30.7* 34.9* 31.6*  MCV 88.8 90.3 90.6 89.7 88.5  PLT 342 338 367 397 361   Cardiac Enzymes: No results for input(s): "CKTOTAL", "CKMB", "CKMBINDEX", "TROPONINI"  in the last 168 hours. BNP (last 3 results) No results for input(s): "PROBNP" in the last 8760 hours. CBG: Recent Labs  Lab 01/02/23 1231 01/02/23 1819 01/03/23 0853 01/03/23 1153 01/03/23 1615  GLUCAP 98 113* 132* 131* 142*   D-Dimer: No results for input(s): "DDIMER" in the last 72 hours. Hgb A1c: No results for input(s): "HGBA1C" in the last 72 hours. Lipid Profile: No results for input(s):  "CHOL", "HDL", "LDLCALC", "TRIG", "CHOLHDL", "LDLDIRECT" in the last 72 hours. Thyroid function studies: No results for input(s): "TSH", "T4TOTAL", "T3FREE", "THYROIDAB" in the last 72 hours.  Invalid input(s): "FREET3" Anemia work up: No results for input(s): "VITAMINB12", "FOLATE", "FERRITIN", "TIBC", "IRON", "RETICCTPCT" in the last 72 hours. Sepsis Labs: Recent Labs  Lab 12/30/22 1000 12/31/22 0425 01/01/23 0432 01/02/23 0406 01/02/23 1417 01/02/23 1658 01/03/23 0604  WBC  --  16.8* 14.3* 14.1*  --   --  10.9*  LATICACIDVEN 0.9  --   --   --  1.4 1.3  --     Microbiology Recent Results (from the past 240 hour(s))  Resp panel by RT-PCR (RSV, Flu A&B, Covid) Anterior Nasal Swab     Status: Abnormal   Collection Time: 12/28/22 12:20 AM   Specimen: Anterior Nasal Swab  Result Value Ref Range Status   SARS Coronavirus 2 by RT PCR POSITIVE (A) NEGATIVE Final    Comment: (NOTE) SARS-CoV-2 target nucleic acids are DETECTED.  The SARS-CoV-2 RNA is generally detectable in upper respiratory specimens during the acute phase of infection. Positive results are indicative of the presence of the identified virus, but do not rule out bacterial infection or co-infection with other pathogens not detected by the test. Clinical correlation with patient history and other diagnostic information is necessary to determine patient infection status. The expected result is Negative.  Fact Sheet for Patients: BloggerCourse.com  Fact Sheet for Healthcare Providers: SeriousBroker.it  This test is not yet approved or cleared by the [place] FDA and  has been authorized for detection and/or diagnosis of SARS-CoV-2 by FDA under an Emergency Use Authorization (EUA).  This EUA will remain in effect (meaning this test can be used) for the duration of  the COVID-19 declaration under Section 564(b)(1) of the A ct, 21 U.S.C. section 360bbb-3(b)(1),  unless the authorization is terminated or revoked sooner.     Influenza A by PCR NEGATIVE NEGATIVE Final   Influenza B by PCR NEGATIVE NEGATIVE Final    Comment: (NOTE) The Xpert Xpress SARS-CoV-2/FLU/RSV plus assay is intended as an aid in the diagnosis of influenza from Nasopharyngeal swab specimens and should not be used as a sole basis for treatment. Nasal washings and aspirates are unacceptable for Xpert Xpress SARS-CoV-2/FLU/RSV testing.  Fact Sheet for Patients: BloggerCourse.com  Fact Sheet for Healthcare Providers: SeriousBroker.it  This test is not yet approved or cleared by the [place] FDA and has been authorized for detection and/or diagnosis of SARS-CoV-2 by FDA under an Emergency Use Authorization (EUA). This EUA will remain in effect (meaning this test can be used) for the duration of the COVID-19 declaration under Section 564(b)(1) of the Act, 21 U.S.C. section 360bbb-3(b)(1), unless the authorization is terminated or revoked.     Resp Syncytial Virus by PCR NEGATIVE NEGATIVE Final    Comment: (NOTE) Fact Sheet for Patients: BloggerCourse.com  Fact Sheet for Healthcare Providers: SeriousBroker.it  This test is not yet approved or cleared by the [place] FDA and has been authorized for detection and/or diagnosis of SARS-CoV-2 by  FDA under an Emergency Use Authorization (EUA). This EUA will remain in effect (meaning this test can be used) for the duration of the COVID-19 declaration under Section 564(b)(1) of the Act, 21 U.S.C. section 360bbb-3(b)(1), unless the authorization is terminated or revoked.  Performed at [facility], [address] [place], [place] [address]   Blood Culture (routine x 2)     Status: Abnormal   Collection Time: 12/28/22 12:20 AM   Specimen: BLOOD  Result Value Ref Range Status   Specimen Description   Final     BLOOD  LEFT [facility] Performed at [facility], [address], [place], [place] [address]    Special Requests   Final    BOTTLES DRAWN AEROBIC AND ANAEROBIC Blood Culture adequate volume Performed at [facility], [address] [place], [place] [address]    Culture  Setup Time   Final    GRAM POSITIVE COCCI ANAEROBIC BOTTLE ONLY Organism ID to follow CRITICAL RESULT CALLED TO, READ BACK BY AND VERIFIED WITH: NATHAN BELUE @ 2204 12/28/22 LFD Performed at [facility], [address] [place], [place] [address]    Culture (A)  Final    STAPHYLOCOCCUS EPIDERMIDIS THE SIGNIFICANCE OF ISOLATING THIS ORGANISM FROM A SINGLE SET OF BLOOD CULTURES WHEN MULTIPLE SETS ARE DRAWN IS UNCERTAIN. PLEASE NOTIFY THE MICROBIOLOGY DEPARTMENT WITHIN ONE WEEK IF SPECIATION AND SENSITIVITIES ARE REQUIRED. Performed at [facility], [address] [place], [place] [address]    Report Status 12/31/2022 FINAL  Final  Blood Culture ID Panel (Reflexed)     Status: Abnormal   Collection Time: 12/28/22 12:20 AM  Result Value Ref Range Status   Enterococcus faecalis NOT DETECTED NOT DETECTED Final   Enterococcus Faecium NOT DETECTED NOT DETECTED Final   Listeria monocytogenes NOT DETECTED NOT DETECTED Final   Staphylococcus species DETECTED (A) NOT DETECTED Final    Comment: CRITICAL RESULT CALLED TO, READ BACK BY AND VERIFIED WITH: NATHAN BELUE @ 2204 12/28/22 LFD    Staphylococcus aureus (BCID) NOT DETECTED NOT DETECTED Final   Staphylococcus epidermidis DETECTED (A) NOT DETECTED Final    Comment: Methicillin (oxacillin) resistant coagulase negative staphylococcus. Possible blood culture contaminant (unless isolated from more than one blood culture draw or clinical case suggests pathogenicity). No antibiotic treatment is indicated for blood  culture contaminants. CRITICAL RESULT CALLED TO, READ BACK BY AND VERIFIED WITH: NATHAN BELUE @ 2204 12/28/22 LFD     Staphylococcus lugdunensis NOT DETECTED NOT DETECTED Final   Streptococcus species NOT DETECTED NOT DETECTED Final   Streptococcus agalactiae NOT DETECTED NOT DETECTED Final   Streptococcus pneumoniae NOT DETECTED NOT DETECTED Final   Streptococcus pyogenes NOT DETECTED NOT DETECTED Final   A.calcoaceticus-baumannii NOT DETECTED NOT DETECTED Final   Bacteroides fragilis NOT DETECTED NOT DETECTED Final   Enterobacterales NOT DETECTED NOT DETECTED Final   Enterobacter cloacae complex NOT DETECTED NOT DETECTED Final   Escherichia coli NOT DETECTED NOT DETECTED Final   Klebsiella aerogenes NOT DETECTED NOT DETECTED Final   Klebsiella oxytoca NOT DETECTED NOT DETECTED Final   Klebsiella pneumoniae NOT DETECTED NOT DETECTED Final   Proteus species NOT DETECTED NOT DETECTED Final   Salmonella species NOT DETECTED NOT DETECTED Final   Serratia marcescens NOT DETECTED NOT DETECTED Final   Haemophilus influenzae NOT DETECTED NOT DETECTED Final   Neisseria meningitidis NOT DETECTED NOT DETECTED Final   Pseudomonas aeruginosa NOT DETECTED NOT DETECTED Final   Stenotrophomonas maltophilia NOT DETECTED NOT DETECTED Final  Candida albicans NOT DETECTED NOT DETECTED Final   Candida auris NOT DETECTED NOT DETECTED Final   Candida glabrata NOT DETECTED NOT DETECTED Final   Candida krusei NOT DETECTED NOT DETECTED Final   Candida parapsilosis NOT DETECTED NOT DETECTED Final   Candida tropicalis NOT DETECTED NOT DETECTED Final   Cryptococcus neoformans/gattii NOT DETECTED NOT DETECTED Final   Methicillin resistance mecA/C DETECTED (A) NOT DETECTED Final    Comment: CRITICAL RESULT CALLED TO, READ BACK BY AND VERIFIED WITH: NATHAN BELUE @ 2204 12/28/22 LFD Performed at [facility], [address] [place], [place] [address]   Blood Culture (routine x 2)     Status: None   Collection Time: 12/28/22  1:06 AM   Specimen: BLOOD  Result Value Ref Range Status   Specimen Description BLOOD RIGHT  FOREARM  Final   Special Requests   Final    BOTTLES DRAWN AEROBIC AND ANAEROBIC Blood Culture results may not be optimal due to an inadequate volume of blood received in culture bottles   Culture   Final    NO GROWTH 5 DAYS Performed at [facility], [address] [place], [place] [address]    Report Status 01/02/2023 FINAL  Final  C Difficile Quick Screen w PCR reflex     Status: Abnormal   Collection Time: 12/29/22  1:01 PM   Specimen: STOOL  Result Value Ref Range Status   C Diff antigen POSITIVE (A) NEGATIVE Final   C Diff toxin POSITIVE (A) NEGATIVE Final   C Diff interpretation Toxin producing C. difficile detected.  Final    Comment: CRITICAL RESULT CALLED TO, READ BACK BY AND VERIFIED WITH: KATIE CLAYTON AT 1432 12/29/22.PMF Performed at [facility], [address] [place], [place] [address]   Urine Culture     Status: Abnormal   Collection Time: 12/30/22 11:43 PM   Specimen: Urine, Random  Result Value Ref Range Status   Specimen Description   Final    URINE, RANDOM Performed at [facility], [address] [place], [place] [address]    Special Requests   Final    NONE Reflexed from [id number] Performed at [facility], [address] [place], [place] [address]    Culture (A)  Final    >=100,000 COLONIES/mL KLEBSIELLA OXYTOCA 70,000 COLONIES/mL ENTEROCOCCUS FAECALIS Confirmed Extended Spectrum Beta-Lactamase Producer (ESBL).  In bloodstream infections from ESBL organisms, carbapenems are preferred over piperacillin/tazobactam. They are shown to have a lower risk of mortality.    Report Status 01/02/2023 FINAL  Final   Organism ID, Bacteria KLEBSIELLA OXYTOCA (A)  Final   Organism ID, Bacteria ENTEROCOCCUS FAECALIS (A)  Final      Susceptibility   Enterococcus faecalis - MIC*    AMPICILLIN <=2 SENSITIVE Sensitive     NITROFURANTOIN <=16 SENSITIVE Sensitive     VANCOMYCIN 1 SENSITIVE Sensitive     * 70,000 COLONIES/mL  ENTEROCOCCUS FAECALIS   Klebsiella oxytoca - MIC*    AMPICILLIN >=32 RESISTANT Resistant     CEFEPIME >=32 RESISTANT Resistant     CEFTRIAXONE >=64 RESISTANT Resistant     CIPROFLOXACIN 1 RESISTANT Resistant     GENTAMICIN >=16 RESISTANT Resistant     IMIPENEM <=0.25 SENSITIVE Sensitive     NITROFURANTOIN 32 SENSITIVE Sensitive     TRIMETH/SULFA >=320 RESISTANT Resistant     AMPICILLIN/SULBACTAM >=32 RESISTANT Resistant     PIP/TAZO <=4 SENSITIVE Sensitive     * >=100,000 COLONIES/mL KLEBSIELLA OXYTOCA  MRSA Next  Gen by PCR, Nasal     Status: None   Collection Time: 12/31/22 12:46 AM   Specimen: Nasal Mucosa; Nasal Swab  Result Value Ref Range Status   MRSA by PCR Next Gen NOT DETECTED NOT DETECTED Final    Comment: (NOTE) The GeneXpert MRSA Assay (FDA approved for NASAL specimens only), is one component of a comprehensive MRSA colonization surveillance program. It is not intended to diagnose MRSA infection nor to guide or monitor treatment for MRSA infections. Test performance is not FDA approved in patients less than 37 years old. Performed at [facility], [address] [place], [place] [address]     Procedures and diagnostic studies:  CT HIP RIGHT WO CONTRAST  Result Date: 01/03/2023 CLINICAL DATA:  Chronic right hip pain. Prior right hemiarthroplasty 07/02/2022. Right hip pain and swelling. EXAM: CT OF THE RIGHT HIP WITHOUT CONTRAST TECHNIQUE: Multidetector CT imaging of the right hip was performed according to the standard protocol. Multiplanar CT image reconstructions were also generated. RADIATION DOSE REDUCTION: This exam was performed according to the departmental dose-optimization program which includes automated exposure control, adjustment of the mA and/or kV according to patient size and/or use of iterative reconstruction technique. COMPARISON:  right hip radiographs 10/18/2021, CT abdomen pelvis 12/28/2022, right hip radiographs 10/18/2021, CT abdomen  pelvis 07/18/2018 FINDINGS: Bones/Joint/Cartilage Postsurgical changes are again seen of total right hip arthroplasty, similar to recent 12/27/2022 CT and again representing a revision from more remote studies. The right acetabular cup again medially protrudes through the now completely absent medial acetabular wall by approximately 6-8 mm. There is again a superior acetabular screw that extends up to approximately 3.7 cm anteriorly and superiorly into the right hemipelvis, and close to the posterior cecal wall (sagittal series 8, image 98). There is diffuse decreased bone mineralization. Within the limitation of metallic streak artifact, no definite perihardware lucency is seen to indicate hardware failure or loosening. There is severe anterior inferior right sacroiliac osteoarthritis with bone-on-bone contact and high-grade subchondral degenerative cysts. Moderate to severe pubic symphysis joint space narrowing, subchondral sclerosis, and peripheral osteophytosis. Within the limitation of diffuse decreased bone mineralization, no definitive acute fracture is seen. Ligaments Suboptimally assessed by CT. Muscles and Tendons There is mild fatty infiltration of the partially visualized right gluteus minimus and medius muscles and the vastus lateralis quadriceps muscle. No definitive tendon tear is seen. Soft tissues There is moderate edema and swelling of the subcutaneous fat of the visualized pelvis and proximal thigh diffusely, appearing increased from 12/27/2022. IMPRESSION: 1. Status post total right hip arthroplasty, similar to recent 12/27/2022 CT and again representing a revision from more remote studies. There is again a superior acetabular screw that extends up to approximately 3.7 cm anteriorly and superiorly into the right hemipelvis, and close to the posterior cecal wall. 2. Within the limitation of diffuse decreased bone mineralization, no definitive acute fracture is seen. 3. Severe anterior inferior  right sacroiliac osteoarthritis. 4. 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 5. Moderate edema and swelling of the subcutaneous fat of the visualized pelvis and proximal thigh, appearing increased from 12/27/2022. Electronically Signed   By: Neita Garnet M.D.   On: 01/03/2023 19:40   US Venous Img Lower Bilateral (DVT)  Result Date: 01/03/2023 CLINICAL DATA:  Bilateral lower extremity pain and edema. Remote history of cervical cancer. Evaluate for DVT. EXAM: BILATERAL LOWER EXTREMITY VENOUS DOPPLER ULTRASOUND TECHNIQUE: Gray-scale sonography with graded compression, as well as color Doppler and duplex ultrasound were performed to evaluate the  lower extremity deep venous systems from the level of the common femoral vein and including the common femoral, femoral, profunda femoral, popliteal and calf veins including the posterior tibial, peroneal and gastrocnemius veins when visible. The superficial great saphenous vein was also interrogated. Spectral Doppler was utilized to evaluate flow at rest and with distal augmentation maneuvers in the common femoral, femoral and popliteal veins. COMPARISON:  None Available. FINDINGS: RIGHT LOWER EXTREMITY Common Femoral Vein: No evidence of thrombus. Normal compressibility, respiratory phasicity and response to augmentation. Saphenofemoral Junction: No evidence of thrombus. Normal compressibility and flow on color Doppler imaging. Profunda Femoral Vein: No evidence of thrombus. Normal compressibility and flow on color Doppler imaging. Femoral Vein: No evidence of thrombus. Normal compressibility, respiratory phasicity and response to augmentation. Popliteal Vein: No evidence of thrombus. Normal compressibility, respiratory phasicity and response to augmentation. Calf Veins: No evidence of thrombus. Normal compressibility and flow on color Doppler imaging. Superficial Great Saphenous Vein: There is age-indeterminate nonocclusive wall thickening/DVT involving the  distal aspect of the greater saphenous vein (images 40 and 43). Other Findings: There is a minimal amount of subcutaneous edema at the level of the right lower leg and calf. LEFT LOWER EXTREMITY Common Femoral Vein: No evidence of thrombus. Normal compressibility, respiratory phasicity and response to augmentation. Saphenofemoral Junction: No evidence of thrombus. Normal compressibility and flow on color Doppler imaging. Profunda Femoral Vein: No evidence of thrombus. Normal compressibility and flow on color Doppler imaging. Femoral Vein: No evidence of thrombus. Normal compressibility, respiratory phasicity and response to augmentation. Popliteal Vein: No evidence of thrombus. Normal compressibility, respiratory phasicity and response to augmentation. Calf Veins: No evidence of thrombus. Normal compressibility and flow on color Doppler imaging. Superficial Great Saphenous Vein: No evidence of thrombus. Normal compressibility. Other Findings: There is a minimal amount of subcutaneous edema at the level of the left calf. IMPRESSION: 1. No evidence of DVT within either lower extremity. 2. Age-indeterminate, though potentially chronic, nonocclusive wall thickening/SVT involving the distal aspect of the right greater saphenous vein. Clinical correlation is advised. Electronically Signed   By: Simonne Come M.D.   On: 01/03/2023 15:00   US RENAL  Result Date: 01/03/2023 CLINICAL DATA:  Acute kidney injury, history hypertension, cervical cancer EXAM: RENAL / URINARY TRACT ULTRASOUND COMPLETE COMPARISON:  CT abdomen and pelvis 12/28/2022 FINDINGS: Right Kidney: Renal measurements: 10.5 x 5.2 x 4.6 cm = volume: 132 mL. Marked cortical thinning. Upper normal cortical echogenicity. Mildly dilated renal collecting system. No mass or shadowing calcification. Left Kidney: Renal measurements: 10.0 x 5.3 x 4.6 cm = volume: 129 mL. Marked cortical thinning. Increased cortical echogenicity. Mild hydronephrosis. No definite mass or  shadowing calcification. Bladder: Appears normal for degree of bladder distention. Other: Small amount of free fluid and small BILATERAL pleural effusions incidentally noted. Cholelithiasis. IMPRESSION: BILATERAL renal cortical thinning and medical renal disease changes. Mild BILATERAL hydronephrosis. Small amount of ascites and small pleural effusions. Electronically Signed   By: Ulyses Southward M.D.   On: 01/03/2023 08:08   ECHOCARDIOGRAM COMPLETE  Result Date: 01/03/2023    ECHOCARDIOGRAM REPORT   Patient Name:   Jeanne [name] Date of Exam: 01/02/2023 Medical Rec #:  [id number]        Height:       64.0 in Accession #:    [id number]       Weight:       185.0 lb Date of Birth:  11-06-1950        BSA:  1.892 m Patient Age:    72 years         BP:           90/66 mmHg Patient Gender: F                HR:           85 bpm. Exam Location:  [facility] Procedure: 2D Echo, Cardiac Doppler and Color Doppler Indications:     I50.9 CHF  History:         Patient has no prior history of Echocardiogram examinations.                  Risk Factors:Hypertension. Chronic kidney disease.  Sonographer:     Daphine Deutscher RDCS Referring Phys:  [id number] Lurene Shadow Diagnosing Phys: Julien Nordmann MD IMPRESSIONS  1. Left ventricular ejection fraction, by estimation, is 60 to 65%. The left ventricle has normal function. The left ventricle has no regional wall motion abnormalities. Left ventricular diastolic parameters were normal.  2. Right ventricular systolic function is normal. The right ventricular size is normal. There is normal pulmonary artery systolic pressure. The estimated right ventricular systolic pressure is 26.5 mmHg.  3. The mitral valve is normal in structure. Mild mitral valve regurgitation. No evidence of mitral stenosis.  4. The aortic valve has an indeterminant number of cusps. Aortic valve regurgitation is not visualized. No aortic stenosis is present.  5. The inferior vena cava is normal in size with  greater than 50% respiratory variability, suggesting right atrial pressure of 3 mmHg. FINDINGS  Left Ventricle: Left ventricular ejection fraction, by estimation, is 60 to 65%. The left ventricle has normal function. The left ventricle has no regional wall motion abnormalities. The left ventricular internal cavity size was normal in size. There is  no left ventricular hypertrophy. Left ventricular diastolic parameters were normal. Right Ventricle: The right ventricular size is normal. No increase in right ventricular wall thickness. Right ventricular systolic function is normal. There is normal pulmonary artery systolic pressure. The tricuspid regurgitant velocity is 2.32 m/s, and  with an assumed right atrial pressure of 5 mmHg, the estimated right ventricular systolic pressure is 26.5 mmHg. Left Atrium: Left atrial size was normal in size. Right Atrium: Right atrial size was normal in size. Pericardium: There is no evidence of pericardial effusion. Mitral Valve: The mitral valve is normal in structure. Mild mitral valve regurgitation. No evidence of mitral valve stenosis. Tricuspid Valve: The tricuspid valve is normal in structure. Tricuspid valve regurgitation is not demonstrated. No evidence of tricuspid stenosis. Aortic Valve: The aortic valve has an indeterminant number of cusps. Aortic valve regurgitation is not visualized. No aortic stenosis is present. Pulmonic Valve: The pulmonic valve was normal in structure. Pulmonic valve regurgitation is not visualized. No evidence of pulmonic stenosis. Aorta: The aortic root is normal in size and structure. Venous: The inferior vena cava is normal in size with greater than 50% respiratory variability, suggesting right atrial pressure of 3 mmHg. IAS/Shunts: No atrial level shunt detected by color flow Doppler.  LEFT VENTRICLE PLAX 2D LVIDd:         3.90 cm   Diastology LVIDs:         2.20 cm   LV e' medial:    9.70 cm/s LV PW:         0.70 cm   LV E/e' medial:  9.4 LV  IVS:        0.90 cm   LV e' lateral:  8.33 cm/s LVOT diam:     1.60 cm   LV E/e' lateral: 10.9 LV SV:         61 LV SV Index:   32 LVOT Area:     2.01 cm  RIGHT VENTRICLE             IVC RV Basal diam:  4.40 cm     IVC diam: 1.20 cm RV S prime:     15.30 cm/s TAPSE (M-mode): 3.0 cm LEFT ATRIUM             Index        RIGHT ATRIUM           Index LA diam:        3.50 cm 1.85 cm/m   RA Area:     13.10 cm LA Vol (A2C):   22.2 ml 11.73 ml/m  RA Volume:   32.70 ml  17.28 ml/m LA Vol (A4C):   24.9 ml 13.16 ml/m LA Biplane Vol: 24.2 ml 12.79 ml/m  AORTIC VALVE LVOT Vmax:   134.00 cm/s LVOT Vmean:  93.200 cm/s LVOT VTI:    0.305 m  AORTA Ao Root diam: 3.60 cm MITRAL VALVE               TRICUSPID VALVE MV Area (PHT): 3.53 cm    TR Peak grad:   21.5 mmHg MV Decel Time: 215 msec    TR Vmax:        232.00 cm/s MV E velocity: 90.70 cm/s MV A velocity: 85.40 cm/s  SHUNTS MV E/A ratio:  1.06        Systemic VTI:  0.30 m                            Systemic Diam: 1.60 cm Julien Nordmann MD Electronically signed by Julien Nordmann MD Signature Date/Time: 01/03/2023/7:54:11 AM    Final                LOS: 7 days   Lurene Shadow  [facility]   Pager on [email or web address]. If 7PM-7AM, please contact night-coverage at [email or web address]     01/04/2023, 2:01 PM

## 2023-01-04 NOTE — Plan of Care (Signed)

## 2023-01-04 NOTE — Consult Note (Signed)
[facility]  CONSULT NOTE    Date: 01/04/2023                  Patient Name:  Jeanne [name]  MRN: [id number]  DOB: 1951/03/30  Age / Sex: 72 y.o., female         PCP: Jerl Mina, MD                 Service Requesting Consult: [facility]                 Reason for Consult: Acute kidney injury            History of Present Illness: Jeanne [name] is a 72 y.o.  female with past medical history of hypertension, cervical cancer, RA, c-diff, and CKD stage 3b, who was admitted to [facility] on 12/27/2022 for Shock circulatory (HCC) [R57.9] Sepsis with hypotension (HCC) [A41.9, I95.9] Abdominal pain, unspecified abdominal location [R10.9] Diarrhea, unspecified type [R19.7]  Patient seen laying in bed, husband at bedside. States she presented to the ED with diarrhea. Husband states her oral intake had decreased over the week prior to arrival. She became more fatigued and somnolent. She was found to be Covid 19 positive with c-diff.   CT abd/pelvis with contrast on admission. Mild left hydronephrosis on renal ultrasound. Creatinine on admission 1.14. Appears to be slightly better than baseline of 1.35. [place] cloudy with leukocytes and bacteria. Klebsiella noted in urine.   Medications: Outpatient medications: Medications Prior to Admission  Medication Sig Dispense Refill Last Dose   diclofenac (VOLTAREN) 75 MG EC tablet Take 1 tablet (75 mg total) by mouth 2 (two) times daily. 60 tablet 5 unk at unk   gabapentin (NEURONTIN) 100 MG capsule Take 100 mg by mouth at bedtime.   unk at unk   lactobacillus acidophilus (BACID) TABS tablet Take 1 tablet by mouth daily.   unk at unk   lidocaine-prilocaine (EMLA) cream APPLY SPARINGLY 2-3 TIMES A DAY TO AREA OF RADIATION THERAPY IRRITATION. 30 g 0 unk at unk   oxyCODONE (OXY IR/ROXICODONE) 5 MG immediate release tablet Take 5-10 mg by mouth every 4 (four) hours as needed.   unk at unk   promethazine (PHENERGAN) 25 MG tablet TAKE  0.5 TABLETS (12.5 MG TOTAL) BY MOUTH EVERY 6 (SIX) HOURS AS NEEDED FOR NAUSEA OR VOMITING. 30 tablet 0 unk at unk   tretinoin (RETIN-A) 0.025 % cream Apply topically at bedtime.   unk at unk   metoprolol tartrate (LOPRESSOR) 25 MG tablet Hold until outpatient followup due to low blood pressure. (Patient not taking: Reported on 12/27/2022) 60 tablet 0 Not Taking   ondansetron (ZOFRAN-ODT) 8 MG disintegrating tablet Take 1 tablet (8 mg total) by mouth every 8 (eight) hours as needed for nausea or vomiting. (Patient not taking: Reported on 12/27/2022) 20 tablet 0 Completed Course    Current medications: Current Facility-Administered Medications  Medication Dose Route Frequency Provider Last Rate Last Admin   (feeding supplement) PROSource Plus liquid 30 mL  30 mL Oral TID BM Sreenath, Sudheer B, MD   30 mL at 01/04/23 0922   0.9 %  sodium chloride infusion  250 mL Intravenous Continuous Rust-Chester, Micheline Rough L, NP       ascorbic acid (VITAMIN C) tablet 500 mg  500 mg Oral BID Vida Rigger, MD   500 mg at 01/04/23 4098   cholecalciferol (VITAMIN D3) 25 MCG (1000 UNIT) tablet 1,000 Units  1,000 Units Oral Daily Sreenath,  Jonelle Sports, MD   1,000 Units at 01/04/23 0921   enoxaparin (LOVENOX) injection 40 mg  40 mg Subcutaneous Q24H Rust-Chester, Britton L, NP   40 mg at 01/04/23 1610   gabapentin (NEURONTIN) capsule 100 mg  100 mg Oral QHS Lolita Patella B, MD   100 mg at 01/03/23 2143   Gerhardt's butt cream   Topical Daily Lolita Patella B, MD   Given at 01/04/23 9604   midodrine (PROAMATINE) tablet 5 mg  5 mg Oral TID WC Lurene Shadow, MD   5 mg at 01/04/23 1200   multivitamin with minerals tablet 1 tablet  1 tablet Oral Daily Vida Rigger, MD   1 tablet at 01/04/23 5409   nutrition supplement (JUVEN) (JUVEN) powder packet 1 packet  1 packet Oral BID BM Lolita Patella B, MD   1 packet at 01/04/23 0922   ondansetron (ZOFRAN) injection 4 mg  4 mg Intravenous Q6H PRN Rust-Chester, Cecelia Byars,  NP   4 mg at 12/29/22 2044   Oral care mouth rinse  15 mL Mouth Rinse PRN Lurene Shadow, MD       oxyCODONE (Oxy IR/ROXICODONE) immediate release tablet 5-10 mg  5-10 mg Oral Q4H PRN Rust-Chester, Cecelia Byars, NP   10 mg at 01/04/23 1204   polyethylene glycol (MIRALAX / GLYCOLAX) packet 17 g  17 g Oral Daily PRN Rust-Chester, Cecelia Byars, NP       promethazine (PHENERGAN) tablet 12.5 mg  12.5 mg Oral Q6H PRN Georgeann Oppenheim, Sudheer B, MD   12.5 mg at 12/30/22 2157   sodium bicarbonate tablet 650 mg  650 mg Oral BID Lurene Shadow, MD   650 mg at 01/04/23 8119   vancomycin (VANCOCIN) capsule 125 mg  125 mg Oral QID Jaynie Bream, RPH   125 mg at 01/04/23 1478   Followed by   Melene Muller ON 01/12/2023] vancomycin (VANCOCIN) capsule 125 mg  125 mg Oral BID Jaynie Bream, RPH       Followed by   Melene Muller ON 01/20/2023] vancomycin (VANCOCIN) capsule 125 mg  125 mg Oral Daily Jaynie Bream, RPH       Followed by   Melene Muller ON 01/27/2023] vancomycin (VANCOCIN) capsule 125 mg  125 mg Oral Raymondo Band A, RPH       Followed by   Melene Muller ON 02/04/2023] vancomycin (VANCOCIN) capsule 125 mg  125 mg Oral Q3 days Jaynie Bream, RPH       zinc sulfate capsule 220 mg  220 mg Oral Daily Lolita Patella B, MD   220 mg at 01/04/23 2956      Allergies: Allergies  Allergen Reactions   Codeine Nausea Only and Nausea And Vomiting   Augmentin [Amoxicillin-Pot Clavulanate] Nausea Only    Very nauseated/ feels sore on body       Past Medical History: Past Medical History:  Diagnosis Date   Arthritis    RA   Cervical cancer (HCC)    CKD stage 3b, GFR 30-44 ml/min (HCC)    Complication of anesthesia    History of kidney stones    Hypertension    PONV (postoperative nausea and vomiting)      Past Surgical History: Past Surgical History:  Procedure Laterality Date   ABDOMINAL HYSTERECTOMY  09/10/2003   CYSTOGRAM N/A 08/04/2018   Procedure: CYSTOGRAM WITH URETHAL DILATION;  Surgeon: Vanna Scotland, MD;  Location: [facility] OR[facility];  Service: Urology;  Laterality: N/A;   CYSTOSCOPY W/ RETROGRADES Bilateral 08/04/2018   Procedure:  CYSTOSCOPY WITH RETROGRADE PYELOGRAM;  Surgeon: Vanna Scotland, MD;  Location: [facility];  Service: Urology;  Laterality: Bilateral;   CYSTOSCOPY/URETEROSCOPY/HOLMIUM LASER/STENT PLACEMENT Left 08/04/2018   Procedure: CYSTOSCOPY/URETEROSCOPY/HOLMIUM LASER/STENT PLACEMENT;  Surgeon: Vanna Scotland, MD;  Location: [facility];  Service: Urology;  Laterality: Left;   JOINT REPLACEMENT     TOTAL HIP ARTHROPLASTY Right 1972   TOTAL HIP ARTHROPLASTY Left 1981   TOTAL KNEE ARTHROPLASTY Right 1990     Family History: Family History  Problem Relation Age of Onset   Breast cancer Mother    Hypertension Father    Cancer Father    Prostate cancer Neg Hx    Kidney cancer Neg Hx    Bladder Cancer Neg Hx      Social History: Social History   Socioeconomic History   Marital status: Married    Spouse name: kim   Number of children: 0   Years of education: Not on file   Highest education level: Some college, no degree  Occupational History   Occupation: disability    Comment: now on [employer]  Tobacco Use   Smoking status: Former    Years: 20    Types: Cigarettes    Quit date: 09/09/1989    Years since quitting: 33.3   Smokeless tobacco: Never   Tobacco comments:    QUIT IN 1990  Vaping Use   Vaping Use: Never used  Substance and Sexual Activity   Alcohol use: No    Alcohol/week: 0.0 standard drinks of alcohol   Drug use: No   Sexual activity: Not Currently    Birth control/protection: None  Other Topics Concern   Not on file  Social History Narrative   Has 1 adopted daughter    Social Determinants of Health   Financial Resource Strain: Low Risk  (08/10/2020)   Overall Financial Resource Strain (CARDIA)    Difficulty of Paying Living Expenses: Not hard at all  Food Insecurity: No Food Insecurity (12/28/2022)   Hunger Vital Sign    Worried About  Running Out of Food in the Last Year: Never true    Ran Out of Food in the Last Year: Never true  Transportation Needs: No Transportation Needs (12/28/2022)   PRAPARE - Administrator, Civil Service (Medical): No    Lack of Transportation (Non-Medical): No  Physical Activity: Inactive (08/10/2020)   Exercise Vital Sign    Days of Exercise per Week: 0 days    Minutes of Exercise per Session: 0 min  Stress: No Stress Concern Present (08/10/2020)   [employer] of Occupational Health - Occupational Stress Questionnaire    Feeling of Stress : Only a little  Social Connections: Moderately Integrated (08/10/2020)   Social Connection and Isolation Panel [NHANES]    Frequency of Communication with Friends and Family: More than three times a week    Frequency of Social Gatherings with Friends and Family: More than three times a week    Attends Religious Services: More than 4 times per year    Active Member of [employer] or Organizations: No    Attends Banker Meetings: Never    Marital Status: Married  Catering manager Violence: Not At Risk (12/28/2022)   Humiliation, Afraid, Rape, and Kick questionnaire    Fear of Current or Ex-Partner: No    Emotionally Abused: No    Physically Abused: No    Sexually Abused: No     Review of Systems: Review of Systems  Constitutional:  Negative for chills,  fever and malaise/fatigue.  HENT:  Negative for congestion, sore throat and tinnitus.   Eyes:  Negative for blurred vision and redness.  Respiratory:  Negative for cough, shortness of breath and wheezing.   Cardiovascular:  Negative for chest pain, palpitations, claudication and leg swelling.  Gastrointestinal:  Positive for diarrhea. Negative for abdominal pain, blood in stool, nausea and vomiting.  Genitourinary:  Negative for flank pain, frequency and hematuria.  Musculoskeletal:  Negative for back pain, falls and myalgias.  Skin:  Negative for rash.  Neurological:  Positive  for weakness. Negative for dizziness and headaches.  Endo/Heme/Allergies:  Does not bruise/bleed easily.  Psychiatric/Behavioral:  Negative for depression. The patient is not nervous/anxious and does not have insomnia.     Vital Signs: Blood pressure 138/71, pulse 65, temperature 97.9 F (36.6 C), temperature source Oral, resp. rate 17, height 5\' 4"  (1.626 m), weight 87.9 kg, SpO2 100 %.  Weight trends: Filed Weights   01/02/23 0500 01/03/23 0349 01/04/23 0500  Weight: 83.9 kg 86.8 kg 87.9 kg    Physical Exam: General: NAD,   Head: Normocephalic, atraumatic. Moist oral mucosal membranes  Eyes: Anicteric  Lungs:  Clear to auscultation  Heart: Regular rate and rhythm  Abdomen:  Soft, nontender,   Extremities:  1+ peripheral edema.  Neurologic: Nonfocal, moving all four extremities  Skin: No lesions  Access: None     Lab results: Basic Metabolic Panel: Recent Labs  Lab 12/29/22 0410 12/30/22 0454 12/31/22 0425 01/01/23 0432 01/02/23 0406 01/03/23 0604 01/04/23 0320  NA 135 137 139   < > 137 137 136  K 3.1* 4.1 3.8   < > 4.2 4.2 4.3  CL 110 116* 119*   < > 115* 116* 115*  CO2 17* 16* 15*   < > 18* 16* 17*  GLUCOSE 102* 110* 108*   < > 110* 154* 151*  BUN 16 12 10    < > 26* 28* 34*  CREATININE 0.99 0.88 1.02*   < > 1.39* 1.58* 1.88*  CALCIUM 7.4* 7.3* 7.3*   < > 7.6* 7.6* 7.7*  MG 2.4 2.0 1.9  --  1.8  --   --   PHOS 2.4* 1.9* 3.2  --  2.9  --   --    < > = values in this interval not displayed.    Liver Function Tests: Recent Labs  Lab 01/04/23 0320  AST 17  ALT 9  ALKPHOS 86  BILITOT 0.5  PROT 4.9*  ALBUMIN 2.1*   No results for input(s): "LIPASE", "AMYLASE" in the last 168 hours. No results for input(s): "AMMONIA" in the last 168 hours.  CBC: Recent Labs  Lab 12/30/22 0454 12/31/22 0425 01/01/23 0432 01/02/23 0406 01/03/23 0604  WBC 19.2* 16.8* 14.3* 14.1* 10.9*  NEUTROABS  --   --   --   --  10.1*  HGB 9.1* 9.1* 9.4* 10.7* 9.7*  HCT 29.4*  29.9* 30.7* 34.9* 31.6*  MCV 88.8 90.3 90.6 89.7 88.5  PLT 342 338 367 397 361    Cardiac Enzymes: No results for input(s): "CKTOTAL", "CKMB", "CKMBINDEX", "TROPONINI" in the last 168 hours.  BNP: Invalid input(s): "POCBNP"  CBG: Recent Labs  Lab 01/02/23 1231 01/02/23 1819 01/03/23 0853 01/03/23 1153 01/03/23 1615  GLUCAP 98 113* 132* 131* 142*    Microbiology: Results for orders placed or performed during the hospital encounter of 12/27/22  Resp panel by RT-PCR (RSV, Flu A&B, Covid) Anterior Nasal Swab     Status: Abnormal  Collection Time: 12/28/22 12:20 AM   Specimen: Anterior Nasal Swab  Result Value Ref Range Status   SARS Coronavirus 2 by RT PCR POSITIVE (A) NEGATIVE Final    Comment: (NOTE) SARS-CoV-2 target nucleic acids are DETECTED.  The SARS-CoV-2 RNA is generally detectable in upper respiratory specimens during the acute phase of infection. Positive results are indicative of the presence of the identified virus, but do not rule out bacterial infection or co-infection with other pathogens not detected by the test. Clinical correlation with patient history and other diagnostic information is necessary to determine patient infection status. The expected result is Negative.  Fact Sheet for Patients: BloggerCourse.com  Fact Sheet for Healthcare Providers: SeriousBroker.it  This test is not yet approved or cleared by the [place] FDA and  has been authorized for detection and/or diagnosis of SARS-CoV-2 by FDA under an Emergency Use Authorization (EUA).  This EUA will remain in effect (meaning this test can be used) for the duration of  the COVID-19 declaration under Section 564(b)(1) of the A ct, 21 U.S.C. section 360bbb-3(b)(1), unless the authorization is terminated or revoked sooner.     Influenza A by PCR NEGATIVE NEGATIVE Final   Influenza B by PCR NEGATIVE NEGATIVE Final    Comment:  (NOTE) The Xpert Xpress SARS-CoV-2/FLU/RSV plus assay is intended as an aid in the diagnosis of influenza from Nasopharyngeal swab specimens and should not be used as a sole basis for treatment. Nasal washings and aspirates are unacceptable for Xpert Xpress SARS-CoV-2/FLU/RSV testing.  Fact Sheet for Patients: BloggerCourse.com  Fact Sheet for Healthcare Providers: SeriousBroker.it  This test is not yet approved or cleared by the [place] FDA and has been authorized for detection and/or diagnosis of SARS-CoV-2 by FDA under an Emergency Use Authorization (EUA). This EUA will remain in effect (meaning this test can be used) for the duration of the COVID-19 declaration under Section 564(b)(1) of the Act, 21 U.S.C. section 360bbb-3(b)(1), unless the authorization is terminated or revoked.     Resp Syncytial Virus by PCR NEGATIVE NEGATIVE Final    Comment: (NOTE) Fact Sheet for Patients: BloggerCourse.com  Fact Sheet for Healthcare Providers: SeriousBroker.it  This test is not yet approved or cleared by the [place] FDA and has been authorized for detection and/or diagnosis of SARS-CoV-2 by FDA under an Emergency Use Authorization (EUA). This EUA will remain in effect (meaning this test can be used) for the duration of the COVID-19 declaration under Section 564(b)(1) of the Act, 21 U.S.C. section 360bbb-3(b)(1), unless the authorization is terminated or revoked.  Performed at [facility], [address] [place], [place] [address]   Blood Culture (routine x 2)     Status: Abnormal   Collection Time: 12/28/22 12:20 AM   Specimen: BLOOD  Result Value Ref Range Status   Specimen Description   Final    BLOOD  LEFT [facility] Performed at [facility], [address], [place], [place] [address]    Special Requests   Final    BOTTLES DRAWN AEROBIC AND  ANAEROBIC Blood Culture adequate volume Performed at [facility], [address] [place], [place] [address]    Culture  Setup Time   Final    GRAM POSITIVE COCCI ANAEROBIC BOTTLE ONLY Organism ID to follow CRITICAL RESULT CALLED TO, READ BACK BY AND VERIFIED WITH: NATHAN BELUE @ 2204 12/28/22 LFD Performed at [facility], [address], [place], [place] [address]    Culture (A)  Final  STAPHYLOCOCCUS EPIDERMIDIS THE SIGNIFICANCE OF ISOLATING THIS ORGANISM FROM A SINGLE SET OF BLOOD CULTURES WHEN MULTIPLE SETS ARE DRAWN IS UNCERTAIN. PLEASE NOTIFY THE MICROBIOLOGY DEPARTMENT WITHIN ONE WEEK IF SPECIATION AND SENSITIVITIES ARE REQUIRED. Performed at [facility], [address], [place], [place] [address]    Report Status 12/31/2022 FINAL  Final  Blood Culture ID Panel (Reflexed)     Status: Abnormal   Collection Time: 12/28/22 12:20 AM  Result Value Ref Range Status   Enterococcus faecalis NOT DETECTED NOT DETECTED Final   Enterococcus Faecium NOT DETECTED NOT DETECTED Final   Listeria monocytogenes NOT DETECTED NOT DETECTED Final   Staphylococcus species DETECTED (A) NOT DETECTED Final    Comment: CRITICAL RESULT CALLED TO, READ BACK BY AND VERIFIED WITH: NATHAN BELUE @ 2204 12/28/22 LFD    Staphylococcus aureus (BCID) NOT DETECTED NOT DETECTED Final   Staphylococcus epidermidis DETECTED (A) NOT DETECTED Final    Comment: Methicillin (oxacillin) resistant coagulase negative staphylococcus. Possible blood culture contaminant (unless isolated from more than one blood culture draw or clinical case suggests pathogenicity). No antibiotic treatment is indicated for blood  culture contaminants. CRITICAL RESULT CALLED TO, READ BACK BY AND VERIFIED WITH: NATHAN BELUE @ 2204 12/28/22 LFD    Staphylococcus lugdunensis NOT DETECTED NOT DETECTED Final   Streptococcus species NOT DETECTED NOT DETECTED Final   Streptococcus agalactiae NOT DETECTED NOT DETECTED  Final   Streptococcus pneumoniae NOT DETECTED NOT DETECTED Final   Streptococcus pyogenes NOT DETECTED NOT DETECTED Final   A.calcoaceticus-baumannii NOT DETECTED NOT DETECTED Final   Bacteroides fragilis NOT DETECTED NOT DETECTED Final   Enterobacterales NOT DETECTED NOT DETECTED Final   Enterobacter cloacae complex NOT DETECTED NOT DETECTED Final   Escherichia coli NOT DETECTED NOT DETECTED Final   Klebsiella aerogenes NOT DETECTED NOT DETECTED Final   Klebsiella oxytoca NOT DETECTED NOT DETECTED Final   Klebsiella pneumoniae NOT DETECTED NOT DETECTED Final   Proteus species NOT DETECTED NOT DETECTED Final   Salmonella species NOT DETECTED NOT DETECTED Final   Serratia marcescens NOT DETECTED NOT DETECTED Final   Haemophilus influenzae NOT DETECTED NOT DETECTED Final   Neisseria meningitidis NOT DETECTED NOT DETECTED Final   Pseudomonas aeruginosa NOT DETECTED NOT DETECTED Final   Stenotrophomonas maltophilia NOT DETECTED NOT DETECTED Final   Candida albicans NOT DETECTED NOT DETECTED Final   Candida auris NOT DETECTED NOT DETECTED Final   Candida glabrata NOT DETECTED NOT DETECTED Final   Candida krusei NOT DETECTED NOT DETECTED Final   Candida parapsilosis NOT DETECTED NOT DETECTED Final   Candida tropicalis NOT DETECTED NOT DETECTED Final   Cryptococcus neoformans/gattii NOT DETECTED NOT DETECTED Final   Methicillin resistance mecA/C DETECTED (A) NOT DETECTED Final    Comment: CRITICAL RESULT CALLED TO, READ BACK BY AND VERIFIED WITH: NATHAN BELUE @ 2204 12/28/22 LFD Performed at [facility], [address] [place], [place] [address]   Blood Culture (routine x 2)     Status: None   Collection Time: 12/28/22  1:06 AM   Specimen: BLOOD  Result Value Ref Range Status   Specimen Description BLOOD RIGHT FOREARM  Final   Special Requests   Final    BOTTLES DRAWN AEROBIC AND ANAEROBIC Blood Culture results may not be optimal due to an inadequate volume of blood received  in culture bottles   Culture   Final    NO GROWTH 5 DAYS Performed at [facility], [address] [place], [place] [address]  Report Status 01/02/2023 FINAL  Final  C Difficile Quick Screen w PCR reflex     Status: Abnormal   Collection Time: 12/29/22  1:01 PM   Specimen: STOOL  Result Value Ref Range Status   C Diff antigen POSITIVE (A) NEGATIVE Final   C Diff toxin POSITIVE (A) NEGATIVE Final   C Diff interpretation Toxin producing C. difficile detected.  Final    Comment: CRITICAL RESULT CALLED TO, READ BACK BY AND VERIFIED WITH: KATIE CLAYTON AT 1432 12/29/22.PMF Performed at [facility], [address] [place], [place] [address]   Urine Culture     Status: Abnormal   Collection Time: 12/30/22 11:43 PM   Specimen: Urine, Random  Result Value Ref Range Status   Specimen Description   Final    URINE, RANDOM Performed at [facility], [address] [place], [place] [address]    Special Requests   Final    NONE Reflexed from [phone] Performed at [facility], [address] [place], [place] [address]    Culture (A)  Final    >=100,000 COLONIES/mL KLEBSIELLA OXYTOCA 70,000 COLONIES/mL ENTEROCOCCUS FAECALIS Confirmed Extended Spectrum Beta-Lactamase Producer (ESBL).  In bloodstream infections from ESBL organisms, carbapenems are preferred over piperacillin/tazobactam. They are shown to have a lower risk of mortality.    Report Status 01/02/2023 FINAL  Final   Organism ID, Bacteria KLEBSIELLA OXYTOCA (A)  Final   Organism ID, Bacteria ENTEROCOCCUS FAECALIS (A)  Final      Susceptibility   Enterococcus faecalis - MIC*    AMPICILLIN <=2 SENSITIVE Sensitive     NITROFURANTOIN <=16 SENSITIVE Sensitive     VANCOMYCIN 1 SENSITIVE Sensitive     * 70,000 COLONIES/mL ENTEROCOCCUS FAECALIS   Klebsiella oxytoca - MIC*    AMPICILLIN >=32 RESISTANT Resistant     CEFEPIME >=32 RESISTANT Resistant     CEFTRIAXONE >=64 RESISTANT Resistant      CIPROFLOXACIN 1 RESISTANT Resistant     GENTAMICIN >=16 RESISTANT Resistant     IMIPENEM <=0.25 SENSITIVE Sensitive     NITROFURANTOIN 32 SENSITIVE Sensitive     TRIMETH/SULFA >=320 RESISTANT Resistant     AMPICILLIN/SULBACTAM >=32 RESISTANT Resistant     PIP/TAZO <=4 SENSITIVE Sensitive     * >=100,000 COLONIES/mL KLEBSIELLA OXYTOCA  MRSA Next Gen by PCR, Nasal     Status: None   Collection Time: 12/31/22 12:46 AM   Specimen: Nasal Mucosa; Nasal Swab  Result Value Ref Range Status   MRSA by PCR Next Gen NOT DETECTED NOT DETECTED Final    Comment: (NOTE) The GeneXpert MRSA Assay (FDA approved for NASAL specimens only), is one component of a comprehensive MRSA colonization surveillance program. It is not intended to diagnose MRSA infection nor to guide or monitor treatment for MRSA infections. Test performance is not FDA approved in patients less than 77 years old. Performed at [facility], [address] [place], [place] [address]     Coagulation Studies: No results for input(s): "LABPROT", "INR" in the last 72 hours.  Urinalysis: Recent Labs    01/02/23 1311  COLORURINE YELLOW*  LABSPEC 1.008  PHURINE 5.0  GLUCOSEU NEGATIVE  HGBUR SMALL*  BILIRUBINUR NEGATIVE  KETONESUR NEGATIVE  PROTEINUR NEGATIVE  NITRITE NEGATIVE  LEUKOCYTESUR LARGE*      Imaging: CT HIP RIGHT WO CONTRAST  Result Date: 01/03/2023 CLINICAL DATA:  Chronic right hip pain. Prior right hemiarthroplasty 07/02/2022. Right hip pain and swelling. EXAM: CT OF THE RIGHT HIP WITHOUT CONTRAST TECHNIQUE:  Multidetector CT imaging of the right hip was performed according to the standard protocol. Multiplanar CT image reconstructions were also generated. RADIATION DOSE REDUCTION: This exam was performed according to the departmental dose-optimization program which includes automated exposure control, adjustment of the mA and/or kV according to patient size and/or use of iterative reconstruction  technique. COMPARISON:  right hip radiographs 10/18/2021, CT abdomen pelvis 12/28/2022, right hip radiographs 10/18/2021, CT abdomen pelvis 07/18/2018 FINDINGS: Bones/Joint/Cartilage Postsurgical changes are again seen of total right hip arthroplasty, similar to recent 12/27/2022 CT and again representing a revision from more remote studies. The right acetabular cup again medially protrudes through the now completely absent medial acetabular wall by approximately 6-8 mm. There is again a superior acetabular screw that extends up to approximately 3.7 cm anteriorly and superiorly into the right hemipelvis, and close to the posterior cecal wall (sagittal series 8, image 98). There is diffuse decreased bone mineralization. Within the limitation of metallic streak artifact, no definite perihardware lucency is seen to indicate hardware failure or loosening. There is severe anterior inferior right sacroiliac osteoarthritis with bone-on-bone contact and high-grade subchondral degenerative cysts. Moderate to severe pubic symphysis joint space narrowing, subchondral sclerosis, and peripheral osteophytosis. Within the limitation of diffuse decreased bone mineralization, no definitive acute fracture is seen. Ligaments Suboptimally assessed by CT. Muscles and Tendons There is mild fatty infiltration of the partially visualized right gluteus minimus and medius muscles and the vastus lateralis quadriceps muscle. No definitive tendon tear is seen. Soft tissues There is moderate edema and swelling of the subcutaneous fat of the visualized pelvis and proximal thigh diffusely, appearing increased from 12/27/2022. IMPRESSION: 1. Status post total right hip arthroplasty, similar to recent 12/27/2022 CT and again representing a revision from more remote studies. There is again a superior acetabular screw that extends up to approximately 3.7 cm anteriorly and superiorly into the right hemipelvis, and close to the posterior cecal wall. 2.  Within the limitation of diffuse decreased bone mineralization, no definitive acute fracture is seen. 3. Severe anterior inferior right sacroiliac osteoarthritis. 4. 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 5. Moderate edema and swelling of the subcutaneous fat of the visualized pelvis and proximal thigh, appearing increased from 12/27/2022. Electronically Signed   By: Neita Garnet M.D.   On: 01/03/2023 19:40   US Venous Img Lower Bilateral (DVT)  Result Date: 01/03/2023 CLINICAL DATA:  Bilateral lower extremity pain and edema. Remote history of cervical cancer. Evaluate for DVT. EXAM: BILATERAL LOWER EXTREMITY VENOUS DOPPLER ULTRASOUND TECHNIQUE: Gray-scale sonography with graded compression, as well as color Doppler and duplex ultrasound were performed to evaluate the lower extremity deep venous systems from the level of the common femoral vein and including the common femoral, femoral, profunda femoral, popliteal and calf veins including the posterior tibial, peroneal and gastrocnemius veins when visible. The superficial great saphenous vein was also interrogated. Spectral Doppler was utilized to evaluate flow at rest and with distal augmentation maneuvers in the common femoral, femoral and popliteal veins. COMPARISON:  None Available. FINDINGS: RIGHT LOWER EXTREMITY Common Femoral Vein: No evidence of thrombus. Normal compressibility, respiratory phasicity and response to augmentation. Saphenofemoral Junction: No evidence of thrombus. Normal compressibility and flow on color Doppler imaging. Profunda Femoral Vein: No evidence of thrombus. Normal compressibility and flow on color Doppler imaging. Femoral Vein: No evidence of thrombus. Normal compressibility, respiratory phasicity and response to augmentation. Popliteal Vein: No evidence of thrombus. Normal compressibility, respiratory phasicity and response to augmentation. Calf Veins: No evidence of thrombus. Normal  compressibility and flow on  color Doppler imaging. Superficial Great Saphenous Vein: There is age-indeterminate nonocclusive wall thickening/DVT involving the distal aspect of the greater saphenous vein (images 40 and 43). Other Findings: There is a minimal amount of subcutaneous edema at the level of the right lower leg and calf. LEFT LOWER EXTREMITY Common Femoral Vein: No evidence of thrombus. Normal compressibility, respiratory phasicity and response to augmentation. Saphenofemoral Junction: No evidence of thrombus. Normal compressibility and flow on color Doppler imaging. Profunda Femoral Vein: No evidence of thrombus. Normal compressibility and flow on color Doppler imaging. Femoral Vein: No evidence of thrombus. Normal compressibility, respiratory phasicity and response to augmentation. Popliteal Vein: No evidence of thrombus. Normal compressibility, respiratory phasicity and response to augmentation. Calf Veins: No evidence of thrombus. Normal compressibility and flow on color Doppler imaging. Superficial Great Saphenous Vein: No evidence of thrombus. Normal compressibility. Other Findings: There is a minimal amount of subcutaneous edema at the level of the left calf. IMPRESSION: 1. No evidence of DVT within either lower extremity. 2. Age-indeterminate, though potentially chronic, nonocclusive wall thickening/SVT involving the distal aspect of the right greater saphenous vein. Clinical correlation is advised. Electronically Signed   By: Simonne Come M.D.   On: 01/03/2023 15:00   US RENAL  Result Date: 01/03/2023 CLINICAL DATA:  Acute kidney injury, history hypertension, cervical cancer EXAM: RENAL / URINARY TRACT ULTRASOUND COMPLETE COMPARISON:  CT abdomen and pelvis 12/28/2022 FINDINGS: Right Kidney: Renal measurements: 10.5 x 5.2 x 4.6 cm = volume: 132 mL. Marked cortical thinning. Upper normal cortical echogenicity. Mildly dilated renal collecting system. No mass or shadowing calcification. Left Kidney: Renal measurements: 10.0 x  5.3 x 4.6 cm = volume: 129 mL. Marked cortical thinning. Increased cortical echogenicity. Mild hydronephrosis. No definite mass or shadowing calcification. Bladder: Appears normal for degree of bladder distention. Other: Small amount of free fluid and small BILATERAL pleural effusions incidentally noted. Cholelithiasis. IMPRESSION: BILATERAL renal cortical thinning and medical renal disease changes. Mild BILATERAL hydronephrosis. Small amount of ascites and small pleural effusions. Electronically Signed   By: Ulyses Southward M.D.   On: 01/03/2023 08:08   ECHOCARDIOGRAM COMPLETE  Result Date: 01/03/2023    ECHOCARDIOGRAM REPORT   Patient Name:   Jeanne [name] Date of Exam: 01/02/2023 Medical Rec #:  [id number]        Height:       64.0 in Accession #:    [id number]       Weight:       185.0 lb Date of Birth:  17-Jul-1951        BSA:          1.892 m Patient Age:    72 years         BP:           90/66 mmHg Patient Gender: F                HR:           85 bpm. Exam Location:  [facility] Procedure: 2D Echo, Cardiac Doppler and Color Doppler Indications:     I50.9 CHF  History:         Patient has no prior history of Echocardiogram examinations.                  Risk Factors:Hypertension. Chronic kidney disease.  Sonographer:     Daphine Deutscher RDCS Referring Phys:  [id number] Lurene Shadow Diagnosing Phys: Julien Nordmann MD IMPRESSIONS  1. Left ventricular ejection fraction,  by estimation, is 60 to 65%. The left ventricle has normal function. The left ventricle has no regional wall motion abnormalities. Left ventricular diastolic parameters were normal.  2. Right ventricular systolic function is normal. The right ventricular size is normal. There is normal pulmonary artery systolic pressure. The estimated right ventricular systolic pressure is 26.5 mmHg.  3. The mitral valve is normal in structure. Mild mitral valve regurgitation. No evidence of mitral stenosis.  4. The aortic valve has an indeterminant number of cusps.  Aortic valve regurgitation is not visualized. No aortic stenosis is present.  5. The inferior vena cava is normal in size with greater than 50% respiratory variability, suggesting right atrial pressure of 3 mmHg. FINDINGS  Left Ventricle: Left ventricular ejection fraction, by estimation, is 60 to 65%. The left ventricle has normal function. The left ventricle has no regional wall motion abnormalities. The left ventricular internal cavity size was normal in size. There is  no left ventricular hypertrophy. Left ventricular diastolic parameters were normal. Right Ventricle: The right ventricular size is normal. No increase in right ventricular wall thickness. Right ventricular systolic function is normal. There is normal pulmonary artery systolic pressure. The tricuspid regurgitant velocity is 2.32 m/s, and  with an assumed right atrial pressure of 5 mmHg, the estimated right ventricular systolic pressure is 26.5 mmHg. Left Atrium: Left atrial size was normal in size. Right Atrium: Right atrial size was normal in size. Pericardium: There is no evidence of pericardial effusion. Mitral Valve: The mitral valve is normal in structure. Mild mitral valve regurgitation. No evidence of mitral valve stenosis. Tricuspid Valve: The tricuspid valve is normal in structure. Tricuspid valve regurgitation is not demonstrated. No evidence of tricuspid stenosis. Aortic Valve: The aortic valve has an indeterminant number of cusps. Aortic valve regurgitation is not visualized. No aortic stenosis is present. Pulmonic Valve: The pulmonic valve was normal in structure. Pulmonic valve regurgitation is not visualized. No evidence of pulmonic stenosis. Aorta: The aortic root is normal in size and structure. Venous: The inferior vena cava is normal in size with greater than 50% respiratory variability, suggesting right atrial pressure of 3 mmHg. IAS/Shunts: No atrial level shunt detected by color flow Doppler.  LEFT VENTRICLE PLAX 2D LVIDd:          3.90 cm   Diastology LVIDs:         2.20 cm   LV e' medial:    9.70 cm/s LV PW:         0.70 cm   LV E/e' medial:  9.4 LV IVS:        0.90 cm   LV e' lateral:   8.33 cm/s LVOT diam:     1.60 cm   LV E/e' lateral: 10.9 LV SV:         61 LV SV Index:   32 LVOT Area:     2.01 cm  RIGHT VENTRICLE             IVC RV Basal diam:  4.40 cm     IVC diam: 1.20 cm RV S prime:     15.30 cm/s TAPSE (M-mode): 3.0 cm LEFT ATRIUM             Index        RIGHT ATRIUM           Index LA diam:        3.50 cm 1.85 cm/m   RA Area:     13.10 cm LA Vol (A2C):  22.2 ml 11.73 ml/m  RA Volume:   32.70 ml  17.28 ml/m LA Vol (A4C):   24.9 ml 13.16 ml/m LA Biplane Vol: 24.2 ml 12.79 ml/m  AORTIC VALVE LVOT Vmax:   134.00 cm/s LVOT Vmean:  93.200 cm/s LVOT VTI:    0.305 m  AORTA Ao Root diam: 3.60 cm MITRAL VALVE               TRICUSPID VALVE MV Area (PHT): 3.53 cm    TR Peak grad:   21.5 mmHg MV Decel Time: 215 msec    TR Vmax:        232.00 cm/s MV E velocity: 90.70 cm/s MV A velocity: 85.40 cm/s  SHUNTS MV E/A ratio:  1.06        Systemic VTI:  0.30 m                            Systemic Diam: 1.60 cm Julien Nordmann MD Electronically signed by Julien Nordmann MD Signature Date/Time: 01/03/2023/7:54:11 AM    Final      Assessment & Plan: Jeanne [name] is a 72 y.o.  female with past medical history of hypertension, cervical cancer, RA, c-diff, and CKD stage 3b, who was admitted to [facility] on 12/27/2022 for Shock circulatory (HCC) [R57.9] Sepsis with hypotension (HCC) [A41.9, I95.9] Abdominal pain, unspecified abdominal location [R10.9] Diarrhea, unspecified type [R19.7]  Acute kidney injury on chronic kidney disease stage 3b appears to be multifactorial. Baseline creatinine appears to be 1.35 with GFR 43 on 11/11/22. Acute kidney injury secondary to  IV contrast nephropathy, hypotension, and NSAID use. Diclofenac held. Renal ultrasound shows left sided mild hydronephrosis. Received IV diuresis yesterday. May consider  additional dosing if needed. Will hold IVF due to edema. No acute indication for dialysis. Will continue to monitor renal indices and urine output.   2. Anemia of chronic kidney disease  Lab Results  Component Value Date   HGB 9.7 (L) 01/03/2023    Hgb within desired range. Will continue to monitor  3. Secondary Hyperparathyroidism: with outpatient labs: None  Lab Results  Component Value Date   CALCIUM 7.7 (L) 01/04/2023   PHOS 2.9 01/02/2023    Corrected calcium 9.2. Encouraged to maintain oral intake.   4. Hypotension, likely due to infection. Prescribed midodrine.   LOS: 7   4/26/202412:30 PM

## 2023-01-04 NOTE — Progress Notes (Signed)
Occupational Therapy Treatment Patient Details Name: Jeanne [name] MRN: [id number] DOB: 11-06-1950 Today's Date: 01/04/2023   History of present illness 72 y/o female recently hospitalized with C.Diff from 11/09/22-11/12/22, discharged with PO vancomycin. She states she completed this antibiotic course as prescribed. She reported that she did have resolution of her symptoms for a few weeks. She also had been COVID 19 + during this admission, but was asymptomatic and did not require treatment.  She reports that over the past week she has been nauseous and had poor PO intake, but denied vomiting. Over the last 4 days she has had multiple bouts of non-bloody diarrhea and bilateral lower quadrant abdominal pain/cramping. She reported these episodes seemed exactly the same in consistency and smell as during her previous C.Diff infection. She also reports generalized fatigue.   OT comments  Upon entering the room, pt supine in bed with husband present in room and agreeable to OT with encouragement. Pt agreeable to attempt bed mobility to EOB again this session. Pt needing max A to EOB for trunk and B LE support. Pt is very fearful and reports increased anxiety but is able to sit EOB with  min - mod sitting balance for ~ 6 minutes. Pt with posterior bias in sitting. She is encouraged that she is able to tolerate more than last session but then politely requests to return to bed. Pt's husband assists therapist with repositioning in bed. RN called for pain medications and linen change.    Recommendations for follow up therapy are one component of a multi-disciplinary discharge planning process, led by the attending physician.  Recommendations may be updated based on patient status, additional functional criteria and insurance authorization.    Assistance Recommended at Discharge Frequent or constant Supervision/Assistance  Patient can return home with the following  A lot of help with walking and/or transfers;A  lot of help with bathing/dressing/bathroom;Assistance with cooking/housework;Assist for transportation;Help with stairs or ramp for entrance   Equipment Recommendations  None recommended by OT       Precautions / Restrictions Precautions Precautions: Fall       Mobility Bed Mobility Overal bed mobility: Needs Assistance Bed Mobility: Supine to Sit, Sit to Supine     Supine to sit: Max assist Sit to supine: Max assist        Transfers                   General transfer comment: unsafe to attempt - pt defers     Balance Overall balance assessment: Needs assistance Sitting-balance support: Bilateral upper extremity supported Sitting balance-Leahy Scale: Poor   Postural control: Posterior lean                                 ADL either performed or assessed with clinical judgement    Extremity/Trunk Assessment Upper Extremity Assessment Upper Extremity Assessment: Generalized weakness   Lower Extremity Assessment Lower Extremity Assessment: Generalized weakness        Vision Patient Visual Report: No change from baseline            Cognition Arousal/Alertness: Awake/alert Behavior During Therapy: Anxious Overall Cognitive Status: Within Functional Limits for tasks assessed  Pertinent Vitals/ Pain       Pain Assessment Pain Assessment: Faces Faces Pain Scale: Hurts little more Pain Location: arthritis Pain Descriptors / Indicators: Discomfort Pain Intervention(s): Repositioned, Patient requesting pain meds-RN notified, Monitored during session, Limited activity within patient's tolerance         Frequency  Min 1X/week        Progress Toward Goals  OT Goals(current goals can now be found in the care plan section)  Progress towards OT goals: Progressing toward goals     Plan Discharge plan remains appropriate;Frequency remains appropriate        AM-PAC OT "6 Clicks" Daily Activity     Outcome Measure   Help from another person eating meals?: A Little Help from another person taking care of personal grooming?: A Little Help from another person toileting, which includes using toliet, bedpan, or urinal?: Total Help from another person bathing (including washing, rinsing, drying)?: A Lot Help from another person to put on and taking off regular upper body clothing?: A Little Help from another person to put on and taking off regular lower body clothing?: Total 6 Click Score: 13    End of Session    OT Visit Diagnosis: Muscle weakness (generalized) (M62.81)   Activity Tolerance Patient limited by fatigue   Patient Left in bed;with call bell/phone within reach;with family/visitor present;with bed alarm set   Nurse Communication Mobility status        Time: 1478-2956 OT Time Calculation (min): 19 min  Charges: OT General Charges $OT Visit: 1 Visit OT Treatments $Therapeutic Activity: 8-22 mins  Jackquline Denmark, MS, OTR/L , CBIS ascom [phone]  01/04/23, 1:15 PM

## 2023-01-05 DIAGNOSIS — N179 Acute kidney failure, unspecified: Secondary | ICD-10-CM | POA: Diagnosis not present

## 2023-01-05 DIAGNOSIS — A0472 Enterocolitis due to Clostridium difficile, not specified as recurrent: Secondary | ICD-10-CM | POA: Diagnosis not present

## 2023-01-05 DIAGNOSIS — K5100 Ulcerative (chronic) pancolitis without complications: Secondary | ICD-10-CM | POA: Diagnosis not present

## 2023-01-05 LAB — BASIC METABOLIC PANEL
Anion gap: 6 (ref 5–15)
BUN: 38 mg/dL — ABNORMAL HIGH (ref 8–23)
CO2: 16 mmol/L — ABNORMAL LOW (ref 22–32)
Calcium: 7.8 mg/dL — ABNORMAL LOW (ref 8.9–10.3)
Chloride: 115 mmol/L — ABNORMAL HIGH (ref 98–111)
Creatinine, Ser: 2.07 mg/dL — ABNORMAL HIGH (ref 0.44–1.00)
GFR, Estimated: 25 mL/min — ABNORMAL LOW (ref >60)
Glucose, Bld: 92 mg/dL (ref 70–99)
Potassium: 4 mmol/L (ref 3.5–5.1)
Sodium: 137 mmol/L (ref 135–145)

## 2023-01-05 LAB — CBC WITH DIFFERENTIAL/PLATELET
Abs Immature Granulocytes: 0.17 10*3/uL — ABNORMAL HIGH (ref 0.00–0.07)
Basophils Absolute: 0 10*3/uL (ref 0.0–0.1)
Basophils Relative: 0 %
Eosinophils Absolute: 0.3 10*3/uL (ref 0.0–0.5)
Eosinophils Relative: 2 %
HCT: 30.3 % — ABNORMAL LOW (ref 36.0–46.0)
Hemoglobin: 9.3 g/dL — ABNORMAL LOW (ref 12.0–15.0)
Immature Granulocytes: 1 %
Lymphocytes Relative: 11 %
Lymphs Abs: 1.5 10*3/uL (ref 0.7–4.0)
MCH: 27.5 pg (ref 26.0–34.0)
MCHC: 30.7 g/dL (ref 30.0–36.0)
MCV: 89.6 fL (ref 80.0–100.0)
Monocytes Absolute: 0.9 10*3/uL (ref 0.1–1.0)
Monocytes Relative: 7 %
Neutro Abs: 10.6 10*3/uL — ABNORMAL HIGH (ref 1.7–7.7)
Neutrophils Relative %: 79 %
Platelets: 347 10*3/uL (ref 150–400)
RBC: 3.38 MIL/uL — ABNORMAL LOW (ref 3.87–5.11)
RDW: 15.9 % — ABNORMAL HIGH (ref 11.5–15.5)
WBC: 13.5 10*3/uL — ABNORMAL HIGH (ref 4.0–10.5)
nRBC: 0 % (ref 0.0–0.2)

## 2023-01-05 MED ORDER — ENOXAPARIN SODIUM 30 MG/0.3ML IJ SOSY
30.0000 mg | PREFILLED_SYRINGE | INTRAMUSCULAR | Status: DC
  Administered 2023-01-06 – 2023-01-07 (×2): 30 mg via SUBCUTANEOUS
  Filled 2023-01-05: qty 0.3

## 2023-01-05 MED ORDER — ALBUMIN HUMAN 25 % IV SOLN
12.5000 g | INTRAVENOUS | Status: AC
  Administered 2023-01-06: 12.5 g via INTRAVENOUS
  Filled 2023-01-05: qty 50

## 2023-01-05 NOTE — Progress Notes (Signed)
PHARMACIST - PHYSICIAN COMMUNICATION  CONCERNING:  Enoxaparin (Lovenox) for DVT Prophylaxis    RECOMMENDATION: Patient was prescribed enoxaparin 40mg  q24 hours for VTE prophylaxis.   Filed Weights   01/03/23 0349 01/04/23 0500 01/05/23 0346  Weight: 86.8 kg (191 lb 5.8 oz) 87.9 kg (193 lb 12.6 oz) 84.1 kg (185 lb 6.5 oz)    Body mass index is 31.83 kg/m.  Estimated Creatinine Clearance: 26.5 mL/min (A) (by C-G formula based on SCr of 2.07 mg/dL (H)).  Patient is candidate for enoxaparin 30mg  every 24 hours based on CrCl <52ml/min or Weight <45kg  DESCRIPTION: Pharmacy has adjusted enoxaparin dose per [facility] policy.  Patient is now receiving enoxaparin 30 mg every 24 hours   Tressie Ellis 01/05/2023 9:11 AM

## 2023-01-05 NOTE — Progress Notes (Addendum)
Progress Note    Jeanne [name]  ZOX:[id number] DOB: 07-31-1951  DOA: 12/27/2022 PCP: Jerl Mina, MD      Brief Narrative:    Medical records reviewed and are as summarized below:  Jeanne [name] is a 72 y.o. female with medical history significant for cervical cancer, rheumatoid arthritis, hypertension, nephrolithiasis, left UPJ urolithiasis, CKD stage IIIb. Of note the patient was recently hospitalized with C.Diff from 11/09/22-11/12/22, discharged with PO vancomycin.  She had previously been taking Bactrim at that time.  She states she completed this antibiotic course as prescribed. She reported that she did have resolution of her symptoms for a few weeks. She also had COVID 19 + during this admission, but was asymptomatic and did not require treatment.   She presented to the hospital because of diarrhea.      Assessment/Plan:   Principal Problem:   Pancolitis (HCC) Active Problems:   Clostridium difficile diarrhea   COVID-19 virus infection   AKI (acute kidney injury) (HCC)   History of Clostridioides difficile colitis   Hypotension   CKD stage 3b, GFR 30-44 ml/min (HCC)   Nutrition Problem: Inadequate oral intake Etiology: acute illness  Signs/Symptoms: per patient/family report   Body mass index is 31.83 kg/m.  (Obesity)    Recurrent C. difficile colitis, pancolitis on CT abdomen, recent C. difficile infection on 11/09/2022: Stools are less watery.  Continue oral vancomycin taper.     Persistent hypotension: Improved.  Continue midodrine.  S/p 2 days of IV hydrocortisone from 01/01/2022.  It is also suspected that low blood pressure recorded on 01/02/2023 was erroneous.   AKI on CKD stage IIIb, normal anion gap metabolic acidosis: Creatinine is still trending upward.  She has been started on Lasix by the nephrologist. Renal ultrasound showed bilateral renal cortical thinning with medical renal disease changes, mild bilateral hydronephrosis with, small  amount of ascites and small pleural effusions.  Continue oral sodium bicarbonate for metabolic acidosis. Diclofenac was discontinued on 01/02/2023.   Rheumatoid arthritis: Oxycodone as needed for pain.  Diclofenac was discontinued on 01/02/2023    Peripheral edema/anasarca: She said she's had lower extremity swelling since surgery in October 2023.  However, it appears lower extremity swelling has worsened.  Suspect some of the swelling may be due to fluid overload.  S/p IV Lasix 20 mg x 1 dose on 01/03/2023. She has been started on IV Lasix by the nephrologist. Venous duplex did not show any evidence of acute DVT.  Age-indeterminate nonocclusive wall thickening/SVT involving the distal aspect of the greater saphenous vein.  2D echo was unremarkable.  EF estimated at 60 to 65%.   Right total hip arthroplasty on 07/02/2022: Patient says she has not been able to walk since her surgery.  She gets physical therapy at home. CT right hip did not show any acute fracture.  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 Attempt to transfer patient to [facility] proved futile.  I called the [facility] transfer line on 01/03/2023.  I was told that [facility] is at capacity and cannot accept transfer  to the medical team, and also there was no medical reason for patient to be transferred to [facility]..  Since there is no acute orthopedic issue, patient cannot be transferred to the orthopedic service.  Outpatient follow-up with Dr. Leontine Locket after discharge from the hospital.   History  of COVID-19 infection: Positive COVID test on 11/09/2022 and repeat COVID test positive on 12/28/2022.    History of nephrolithiasis and left urolithiasis.       Diet Order             Diet regular Fluid consistency: Thin  Diet effective now                             Consultants: Intensivist Infectious disease specialist Nephrologist  Procedures: None    Medications:    (feeding supplement) PROSource Plus  30 mL Oral TID BM   vitamin C  500 mg Oral BID   cholecalciferol  1,000 Units Oral Daily   [START ON 01/06/2023] enoxaparin (LOVENOX) injection  30 mg Subcutaneous Q24H   furosemide  40 mg Intravenous Daily   gabapentin  100 mg Oral QHS   Gerhardt's butt cream   Topical Daily   midodrine  5 mg Oral TID WC   multivitamin with minerals  1 tablet Oral Daily   nutrition supplement (JUVEN)  1 packet Oral BID BM   sodium bicarbonate  650 mg Oral BID   vancomycin  125 mg Oral QID   Followed by   Melene Muller ON 01/12/2023] vancomycin  125 mg Oral BID   Followed by   Melene Muller ON 01/20/2023] vancomycin  125 mg Oral Daily   Followed by   Melene Muller ON 01/27/2023] vancomycin  125 mg Oral QODAY   Followed by   Melene Muller ON 02/04/2023] vancomycin  125 mg Oral Q3 days   zinc sulfate  220 mg Oral Daily   Continuous Infusions:  sodium chloride     [START ON 01/06/2023] albumin human       Anti-infectives (From admission, onward)    Start     Dose/Rate Route Frequency Ordered Stop   02/04/23 1000  vancomycin (VANCOCIN) capsule 125 mg       See Hyperspace for full Linked Orders Report.   125 mg Oral Every 3 DAYS 12/31/22 1243 02/13/23 0959   01/27/23 1000  vancomycin (VANCOCIN) capsule 125 mg       See Hyperspace for full Linked Orders Report.   125 mg Oral Every other day 12/31/22 1243 02/04/23 0959   01/20/23 1000  vancomycin (VANCOCIN) capsule 125 mg       See Hyperspace for full Linked Orders Report.   125 mg Oral Daily 12/31/22 1243 01/27/23 0959   01/12/23 2200  vancomycin (VANCOCIN) capsule 125 mg       See Hyperspace for full Linked Orders Report.   125 mg Oral 2 times daily 12/31/22 1243 01/19/23 2159   12/31/22 1800  vancomycin (VANCOCIN) capsule 125 mg       See Hyperspace for full Linked Orders Report.   125 mg Oral 4 times  daily 12/31/22 1243 01/12/23 1759   12/28/22 1415  vancomycin (VANCOCIN) 50 mg/mL oral solution SOLN 125 mg  Status:  Discontinued        125 mg Oral Every 6 hours 12/28/22 1323 12/31/22 1243   12/28/22 1000  metroNIDAZOLE (FLAGYL) IVPB 500 mg  Status:  Discontinued        500 mg 100 mL/hr over 60 Minutes Intravenous Every 12 hours 12/28/22 0410 12/28/22 1426   12/27/22 2345  metroNIDAZOLE (FLAGYL) IVPB 500 mg        500 mg 100 mL/hr over 60 Minutes Intravenous  Once 12/27/22 2344 12/28/22 0246  Family Communication/Anticipated D/C date and plan/Code Status   DVT prophylaxis: enoxaparin (LOVENOX) injection 30 mg Start: 01/06/23 0800 SCDs Start: 12/28/22 0259     Code Status: Full Code  Family Communication: None Disposition Plan: Plans to discharge home in 2 to 3 days   Status is: Inpatient Remains inpatient appropriate because: Recurrent C. difficile colitis and low blood pressure       Subjective:   She had diarrhea yesterday but stools were mushy.  She had 1 mushy stool this morning.  No abdominal pain or vomiting.  She has adequate urine output.   Objective:    Vitals:   01/05/23 0346 01/05/23 0347 01/05/23 0802 01/05/23 1242  BP:  116/77 133/68 117/61  Pulse:  66 73 78  Resp:  17 16 16   Temp:  97.6 F (36.4 C) 97.7 F (36.5 C) (!) 97.5 F (36.4 C)  TempSrc:  Oral    SpO2:  100% 100% 99%  Weight: 84.1 kg     Height:       No data found.   Intake/Output Summary (Last 24 hours) at 01/05/2023 1440 Last data filed at 01/05/2023 1102 Gross per 24 hour  Intake 240 ml  Output 1802 ml  Net -1562 ml   Filed Weights   01/03/23 0349 01/04/23 0500 01/05/23 0346  Weight: 86.8 kg 87.9 kg 84.1 kg    Exam:   GEN: NAD SKIN: Warm and dry EYES: No pallor or icterus ENT: MMM CV: RRR PULM: CTA B ABD: soft, obese/distended, NT, +BS CNS: AAO x 3, non focal EXT: Significant bilateral lower extremity edema      Data Reviewed:   I  have personally reviewed following labs and imaging studies:  Labs: Labs show the following:   Basic Metabolic Panel: Recent Labs  Lab 12/30/22 0454 12/31/22 0425 01/01/23 0432 01/02/23 0406 01/03/23 0604 01/04/23 0320 01/05/23 0531  NA 137 139 137 137 137 136 137  K 4.1 3.8 3.6 4.2 4.2 4.3 4.0  CL 116* 119* 119* 115* 116* 115* 115*  CO2 16* 15* 14* 18* 16* 17* 16*  GLUCOSE 110* 108* 122* 110* 154* 151* 92  BUN 12 10 20  26* 28* 34* 38*  CREATININE 0.88 1.02* 1.24* 1.39* 1.58* 1.88* 2.07*  CALCIUM 7.3* 7.3* 7.1* 7.6* 7.6* 7.7* 7.8*  MG 2.0 1.9  --  1.8  --   --   --   PHOS 1.9* 3.2  --  2.9  --   --   --    GFR Estimated Creatinine Clearance: 26.5 mL/min (A) (by C-G formula based on SCr of 2.07 mg/dL (H)). Liver Function Tests: Recent Labs  Lab 01/04/23 0320  AST 17  ALT 9  ALKPHOS 86  BILITOT 0.5  PROT 4.9*  ALBUMIN 2.1*   No results for input(s): "LIPASE", "AMYLASE" in the last 168 hours. No results for input(s): "AMMONIA" in the last 168 hours. Coagulation profile No results for input(s): "INR", "PROTIME" in the last 168 hours.  CBC: Recent Labs  Lab 12/31/22 0425 01/01/23 0432 01/02/23 0406 01/03/23 0604 01/05/23 0531  WBC 16.8* 14.3* 14.1* 10.9* 13.5*  NEUTROABS  --   --   --  10.1* 10.6*  HGB 9.1* 9.4* 10.7* 9.7* 9.3*  HCT 29.9* 30.7* 34.9* 31.6* 30.3*  MCV 90.3 90.6 89.7 88.5 89.6  PLT 338 367 397 361 347   Cardiac Enzymes: No results for input(s): "CKTOTAL", "CKMB", "CKMBINDEX", "TROPONINI" in the last 168 hours. BNP (last 3 results) No results for input(s): "PROBNP"  in the last 8760 hours. CBG: Recent Labs  Lab 01/02/23 1819 01/03/23 0853 01/03/23 1153 01/03/23 1615 01/04/23 2123  GLUCAP 113* 132* 131* 142* 111*   D-Dimer: No results for input(s): "DDIMER" in the last 72 hours. Hgb A1c: No results for input(s): "HGBA1C" in the last 72 hours. Lipid Profile: No results for input(s): "CHOL", "HDL", "LDLCALC", "TRIG", "CHOLHDL",  "LDLDIRECT" in the last 72 hours. Thyroid function studies: No results for input(s): "TSH", "T4TOTAL", "T3FREE", "THYROIDAB" in the last 72 hours.  Invalid input(s): "FREET3" Anemia work up: No results for input(s): "VITAMINB12", "FOLATE", "FERRITIN", "TIBC", "IRON", "RETICCTPCT" in the last 72 hours. Sepsis Labs: Recent Labs  Lab 12/30/22 1000 12/31/22 0425 01/01/23 0432 01/02/23 0406 01/02/23 1417 01/02/23 1658 01/03/23 0604 01/05/23 0531  WBC  --    < > 14.3* 14.1*  --   --  10.9* 13.5*  LATICACIDVEN 0.9  --   --   --  1.4 1.3  --   --    < > = values in this interval not displayed.    Microbiology Recent Results (from the past 240 hour(s))  Resp panel by RT-PCR (RSV, Flu A&B, Covid) Anterior Nasal Swab     Status: Abnormal   Collection Time: 12/28/22 12:20 AM   Specimen: Anterior Nasal Swab  Result Value Ref Range Status   SARS Coronavirus 2 by RT PCR POSITIVE (A) NEGATIVE Final    Comment: (NOTE) SARS-CoV-2 target nucleic acids are DETECTED.  The SARS-CoV-2 RNA is generally detectable in upper respiratory specimens during the acute phase of infection. Positive results are indicative of the presence of the identified virus, but do not rule out bacterial infection or co-infection with other pathogens not detected by the test. Clinical correlation with patient history and other diagnostic information is necessary to determine patient infection status. The expected result is Negative.  Fact Sheet for Patients: BloggerCourse.com  Fact Sheet for Healthcare Providers: SeriousBroker.it  This test is not yet approved or cleared by the [place] FDA and  has been authorized for detection and/or diagnosis of SARS-CoV-2 by FDA under an Emergency Use Authorization (EUA).  This EUA will remain in effect (meaning this test can be used) for the duration of  the COVID-19 declaration under Section 564(b)(1) of the A ct,  21 U.S.C. section 360bbb-3(b)(1), unless the authorization is terminated or revoked sooner.     Influenza A by PCR NEGATIVE NEGATIVE Final   Influenza B by PCR NEGATIVE NEGATIVE Final    Comment: (NOTE) The Xpert Xpress SARS-CoV-2/FLU/RSV plus assay is intended as an aid in the diagnosis of influenza from Nasopharyngeal swab specimens and should not be used as a sole basis for treatment. Nasal washings and aspirates are unacceptable for Xpert Xpress SARS-CoV-2/FLU/RSV testing.  Fact Sheet for Patients: BloggerCourse.com  Fact Sheet for Healthcare Providers: SeriousBroker.it  This test is not yet approved or cleared by the [place] FDA and has been authorized for detection and/or diagnosis of SARS-CoV-2 by FDA under an Emergency Use Authorization (EUA). This EUA will remain in effect (meaning this test can be used) for the duration of the COVID-19 declaration under Section 564(b)(1) of the Act, 21 U.S.C. section 360bbb-3(b)(1), unless the authorization is terminated or revoked.     Resp Syncytial Virus by PCR NEGATIVE NEGATIVE Final    Comment: (NOTE) Fact Sheet for Patients: BloggerCourse.com  Fact Sheet for Healthcare Providers: SeriousBroker.it  This test is not yet approved or cleared by the [place] and has been authorized for  detection and/or diagnosis of SARS-CoV-2 by FDA under an Emergency Use Authorization (EUA). This EUA will remain in effect (meaning this test can be used) for the duration of the COVID-19 declaration under Section 564(b)(1) of the Act, 21 U.S.C. section 360bbb-3(b)(1), unless the authorization is terminated or revoked.  Performed at [facility], [address] [place], [place] [address]   Blood Culture (routine x 2)     Status: Abnormal   Collection Time: 12/28/22 12:20 AM   Specimen: BLOOD  Result Value Ref Range  Status   Specimen Description   Final    BLOOD  LEFT [facility] Performed at [facility], [address], [place], [place] [address]    Special Requests   Final    BOTTLES DRAWN AEROBIC AND ANAEROBIC Blood Culture adequate volume Performed at [facility], [address] [place], [place] [address]    Culture  Setup Time   Final    GRAM POSITIVE COCCI ANAEROBIC BOTTLE ONLY Organism ID to follow CRITICAL RESULT CALLED TO, READ BACK BY AND VERIFIED WITH: NATHAN BELUE @ 2204 12/28/22 LFD Performed at [facility], [address] [place], [place] [address]    Culture (A)  Final    STAPHYLOCOCCUS EPIDERMIDIS THE SIGNIFICANCE OF ISOLATING THIS ORGANISM FROM A SINGLE SET OF BLOOD CULTURES WHEN MULTIPLE SETS ARE DRAWN IS UNCERTAIN. PLEASE NOTIFY THE MICROBIOLOGY DEPARTMENT WITHIN ONE WEEK IF SPECIATION AND SENSITIVITIES ARE REQUIRED. Performed at [facility], [address] [place], [place] [address]    Report Status 12/31/2022 FINAL  Final  Blood Culture ID Panel (Reflexed)     Status: Abnormal   Collection Time: 12/28/22 12:20 AM  Result Value Ref Range Status   Enterococcus faecalis NOT DETECTED NOT DETECTED Final   Enterococcus Faecium NOT DETECTED NOT DETECTED Final   Listeria monocytogenes NOT DETECTED NOT DETECTED Final   Staphylococcus species DETECTED (A) NOT DETECTED Final    Comment: CRITICAL RESULT CALLED TO, READ BACK BY AND VERIFIED WITH: NATHAN BELUE @ 2204 12/28/22 LFD    Staphylococcus aureus (BCID) NOT DETECTED NOT DETECTED Final   Staphylococcus epidermidis DETECTED (A) NOT DETECTED Final    Comment: Methicillin (oxacillin) resistant coagulase negative staphylococcus. Possible blood culture contaminant (unless isolated from more than one blood culture draw or clinical case suggests pathogenicity). No antibiotic treatment is indicated for blood  culture contaminants. CRITICAL RESULT CALLED TO, READ BACK BY AND VERIFIED WITH: NATHAN BELUE  @ 2204 12/28/22 LFD    Staphylococcus lugdunensis NOT DETECTED NOT DETECTED Final   Streptococcus species NOT DETECTED NOT DETECTED Final   Streptococcus agalactiae NOT DETECTED NOT DETECTED Final   Streptococcus pneumoniae NOT DETECTED NOT DETECTED Final   Streptococcus pyogenes NOT DETECTED NOT DETECTED Final   A.calcoaceticus-baumannii NOT DETECTED NOT DETECTED Final   Bacteroides fragilis NOT DETECTED NOT DETECTED Final   Enterobacterales NOT DETECTED NOT DETECTED Final   Enterobacter cloacae complex NOT DETECTED NOT DETECTED Final   Escherichia coli NOT DETECTED NOT DETECTED Final   Klebsiella aerogenes NOT DETECTED NOT DETECTED Final   Klebsiella oxytoca NOT DETECTED NOT DETECTED Final   Klebsiella pneumoniae NOT DETECTED NOT DETECTED Final   Proteus species NOT DETECTED NOT DETECTED Final   Salmonella species NOT DETECTED NOT DETECTED Final   Serratia marcescens NOT DETECTED NOT DETECTED Final   Haemophilus influenzae NOT DETECTED NOT DETECTED Final   Neisseria meningitidis NOT DETECTED NOT DETECTED Final   Pseudomonas aeruginosa NOT DETECTED NOT DETECTED Final   Stenotrophomonas  maltophilia NOT DETECTED NOT DETECTED Final   Candida albicans NOT DETECTED NOT DETECTED Final   Candida auris NOT DETECTED NOT DETECTED Final   Candida glabrata NOT DETECTED NOT DETECTED Final   Candida krusei NOT DETECTED NOT DETECTED Final   Candida parapsilosis NOT DETECTED NOT DETECTED Final   Candida tropicalis NOT DETECTED NOT DETECTED Final   Cryptococcus neoformans/gattii NOT DETECTED NOT DETECTED Final   Methicillin resistance mecA/C DETECTED (A) NOT DETECTED Final    Comment: CRITICAL RESULT CALLED TO, READ BACK BY AND VERIFIED WITH: NATHAN BELUE @ 2204 12/28/22 LFD Performed at [facility], [address] [place], [place] [address]   Blood Culture (routine x 2)     Status: None   Collection Time: 12/28/22  1:06 AM   Specimen: BLOOD  Result Value Ref Range Status   Specimen  Description BLOOD RIGHT FOREARM  Final   Special Requests   Final    BOTTLES DRAWN AEROBIC AND ANAEROBIC Blood Culture results may not be optimal due to an inadequate volume of blood received in culture bottles   Culture   Final    NO GROWTH 5 DAYS Performed at [facility], [address] [place], [place] [address]    Report Status 01/02/2023 FINAL  Final  C Difficile Quick Screen w PCR reflex     Status: Abnormal   Collection Time: 12/29/22  1:01 PM   Specimen: STOOL  Result Value Ref Range Status   C Diff antigen POSITIVE (A) NEGATIVE Final   C Diff toxin POSITIVE (A) NEGATIVE Final   C Diff interpretation Toxin producing C. difficile detected.  Final    Comment: CRITICAL RESULT CALLED TO, READ BACK BY AND VERIFIED WITH: KATIE CLAYTON AT 1432 12/29/22.PMF Performed at [facility], [address], [place], [place] [address]   Urine Culture     Status: Abnormal   Collection Time: 12/30/22 11:43 PM   Specimen: Urine, Random  Result Value Ref Range Status   Specimen Description   Final    URINE, RANDOM Performed at [facility], [address] [place], [place] [address]    Special Requests   Final    NONE Reflexed from [phone] Performed at [facility], [address] [place], [place] [address]    Culture (A)  Final    >=100,000 COLONIES/mL KLEBSIELLA OXYTOCA 70,000 COLONIES/mL ENTEROCOCCUS FAECALIS Confirmed Extended Spectrum Beta-Lactamase Producer (ESBL).  In bloodstream infections from ESBL organisms, carbapenems are preferred over piperacillin/tazobactam. They are shown to have a lower risk of mortality.    Report Status 01/02/2023 FINAL  Final   Organism ID, Bacteria KLEBSIELLA OXYTOCA (A)  Final   Organism ID, Bacteria ENTEROCOCCUS FAECALIS (A)  Final      Susceptibility   Enterococcus faecalis - MIC*    AMPICILLIN <=2 SENSITIVE Sensitive     NITROFURANTOIN <=16 SENSITIVE Sensitive     VANCOMYCIN 1 SENSITIVE Sensitive     *  70,000 COLONIES/mL ENTEROCOCCUS FAECALIS   Klebsiella oxytoca - MIC*    AMPICILLIN >=32 RESISTANT Resistant     CEFEPIME >=32 RESISTANT Resistant     CEFTRIAXONE >=64 RESISTANT Resistant     CIPROFLOXACIN 1 RESISTANT Resistant     GENTAMICIN >=16 RESISTANT Resistant     IMIPENEM <=0.25 SENSITIVE Sensitive     NITROFURANTOIN 32 SENSITIVE Sensitive     TRIMETH/SULFA >=320 RESISTANT Resistant     AMPICILLIN/SULBACTAM >=32 RESISTANT Resistant     PIP/TAZO <=4 SENSITIVE Sensitive     * >=  100,000 COLONIES/mL KLEBSIELLA OXYTOCA  MRSA Next Gen by PCR, Nasal     Status: None   Collection Time: 12/31/22 12:46 AM   Specimen: Nasal Mucosa; Nasal Swab  Result Value Ref Range Status   MRSA by PCR Next Gen NOT DETECTED NOT DETECTED Final    Comment: (NOTE) The GeneXpert MRSA Assay (FDA approved for NASAL specimens only), is one component of a comprehensive MRSA colonization surveillance program. It is not intended to diagnose MRSA infection nor to guide or monitor treatment for MRSA infections. Test performance is not FDA approved in patients less than 22 years old. Performed at [facility], [address] [place], [place] [address]     Procedures and diagnostic studies:  CT HIP RIGHT WO CONTRAST  Result Date: 01/03/2023 CLINICAL DATA:  Chronic right hip pain. Prior right hemiarthroplasty 07/02/2022. Right hip pain and swelling. EXAM: CT OF THE RIGHT HIP WITHOUT CONTRAST TECHNIQUE: Multidetector CT imaging of the right hip was performed according to the standard protocol. Multiplanar CT image reconstructions were also generated. RADIATION DOSE REDUCTION: This exam was performed according to the departmental dose-optimization program which includes automated exposure control, adjustment of the mA and/or kV according to patient size and/or use of iterative reconstruction technique. COMPARISON:  right hip radiographs 10/18/2021, CT abdomen pelvis 12/28/2022, right hip radiographs  10/18/2021, CT abdomen pelvis 07/18/2018 FINDINGS: Bones/Joint/Cartilage Postsurgical changes are again seen of total right hip arthroplasty, similar to recent 12/27/2022 CT and again representing a revision from more remote studies. The right acetabular cup again medially protrudes through the now completely absent medial acetabular wall by approximately 6-8 mm. There is again a superior acetabular screw that extends up to approximately 3.7 cm anteriorly and superiorly into the right hemipelvis, and close to the posterior cecal wall (sagittal series 8, image 98). There is diffuse decreased bone mineralization. Within the limitation of metallic streak artifact, no definite perihardware lucency is seen to indicate hardware failure or loosening. There is severe anterior inferior right sacroiliac osteoarthritis with bone-on-bone contact and high-grade subchondral degenerative cysts. Moderate to severe pubic symphysis joint space narrowing, subchondral sclerosis, and peripheral osteophytosis. Within the limitation of diffuse decreased bone mineralization, no definitive acute fracture is seen. Ligaments Suboptimally assessed by CT. Muscles and Tendons There is mild fatty infiltration of the partially visualized right gluteus minimus and medius muscles and the vastus lateralis quadriceps muscle. No definitive tendon tear is seen. Soft tissues There is moderate edema and swelling of the subcutaneous fat of the visualized pelvis and proximal thigh diffusely, appearing increased from 12/27/2022. IMPRESSION: 1. Status post total right hip arthroplasty, similar to recent 12/27/2022 CT and again representing a revision from more remote studies. There is again a superior acetabular screw that extends up to approximately 3.7 cm anteriorly and superiorly into the right hemipelvis, and close to the posterior cecal wall. 2. Within the limitation of diffuse decreased bone mineralization, no definitive acute fracture is seen. 3.  Severe anterior inferior right sacroiliac osteoarthritis. 4. 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 5. Moderate edema and swelling of the subcutaneous fat of the visualized pelvis and proximal thigh, appearing increased from 12/27/2022. Electronically Signed   By: Neita Garnet M.D.   On: 01/03/2023 19:40               LOS: 8 days   Brityn Mastrogiovanni  [facility]   Pager on [email or web address]. If 7PM-7AM, please contact night-coverage at [email or web address]     01/05/2023, 2:40 PM

## 2023-01-05 NOTE — Plan of Care (Signed)

## 2023-01-05 NOTE — Progress Notes (Signed)
[facility] [place], [place] 01/05/23  Subjective:   Hospital day # 8  Patient states that she has been voiding more.  Her edema feels less to her although she still has significant swelling to the proximal arms and lower legs.  No complaints of shortness of breath. Renal: 04/26 0701 - 04/27 0700 In: 240 [P.O.:240] Out: 1000 [Urine:1000] Lab Results  Component Value Date   CREATININE 2.07 (H) 01/05/2023   CREATININE 1.88 (H) 01/04/2023   CREATININE 1.58 (H) 01/03/2023     Objective:  Vital signs in last 24 hours:  Temp:  [97.6 F (36.4 C)-98.6 F (37 C)] 97.7 F (36.5 C) (04/27 0802) Pulse Rate:  [63-73] 73 (04/27 0802) Resp:  [16-19] 16 (04/27 0802) BP: (116-138)/(55-77) 133/68 (04/27 0802) SpO2:  [99 %-100 %] 100 % (04/27 0802) Weight:  [84.1 kg] 84.1 kg (04/27 0346)  Weight change: -3.8 kg Filed Weights   01/03/23 0349 01/04/23 0500 01/05/23 0346  Weight: 86.8 kg 87.9 kg 84.1 kg    Intake/Output:    Intake/Output Summary (Last 24 hours) at 01/05/2023 0850 Last data filed at 01/05/2023 0607 Gross per 24 hour  Intake 240 ml  Output 1000 ml  Net -760 ml     Physical Exam: General: Laying in the bed, no acute distress  HEENT Moist oral mucous membranes  Pulm/lungs Coarse breath sounds  CVS/Heart No rub  Abdomen:  Soft, nontender  Extremities: Dependent edema present  Neurologic: Alert, oriented  Skin: Warm  Access:        Basic Metabolic Panel:  Recent Labs  Lab 12/30/22 0454 12/31/22 0425 01/01/23 0432 01/02/23 0406 01/03/23 0604 01/04/23 0320 01/05/23 0531  NA 137 139 137 137 137 136 137  K 4.1 3.8 3.6 4.2 4.2 4.3 4.0  CL 116* 119* 119* 115* 116* 115* 115*  CO2 16* 15* 14* 18* 16* 17* 16*  GLUCOSE 110* 108* 122* 110* 154* 151* 92  BUN 12 10 20  26* 28* 34* 38*  CREATININE 0.88 1.02* 1.24* 1.39* 1.58* 1.88* 2.07*  CALCIUM 7.3* 7.3* 7.1* 7.6* 7.6* 7.7* 7.8*  MG 2.0 1.9  --  1.8  --   --   --   PHOS 1.9* 3.2  --  2.9   --   --   --      CBC: Recent Labs  Lab 12/31/22 0425 01/01/23 0432 01/02/23 0406 01/03/23 0604 01/05/23 0531  WBC 16.8* 14.3* 14.1* 10.9* 13.5*  NEUTROABS  --   --   --  10.1* 10.6*  HGB 9.1* 9.4* 10.7* 9.7* 9.3*  HCT 29.9* 30.7* 34.9* 31.6* 30.3*  MCV 90.3 90.6 89.7 88.5 89.6  PLT 338 367 397 361 347      Lab Results  Component Value Date   HEPBSAG Negative 02/21/2016   HEPBIGM Negative 02/21/2016      Microbiology:  Recent Results (from the past 240 hour(s))  Resp panel by RT-PCR (RSV, Flu A&B, Covid) Anterior Nasal Swab     Status: Abnormal   Collection Time: 12/28/22 12:20 AM   Specimen: Anterior Nasal Swab  Result Value Ref Range Status   SARS Coronavirus 2 by RT PCR POSITIVE (A) NEGATIVE Final    Comment: (NOTE) SARS-CoV-2 target nucleic acids are DETECTED.  The SARS-CoV-2 RNA is generally detectable in upper respiratory specimens during the acute phase of infection. Positive results are indicative of the presence of the identified virus, but do not rule out bacterial infection or co-infection with other pathogens not detected by the  test. Clinical correlation with patient history and other diagnostic information is necessary to determine patient infection status. The expected result is Negative.  Fact Sheet for Patients: BloggerCourse.com  Fact Sheet for Healthcare Providers: SeriousBroker.it  This test is not yet approved or cleared by the [place] FDA and  has been authorized for detection and/or diagnosis of SARS-CoV-2 by FDA under an Emergency Use Authorization (EUA).  This EUA will remain in effect (meaning this test can be used) for the duration of  the COVID-19 declaration under Section 564(b)(1) of the A ct, 21 U.S.C. section 360bbb-3(b)(1), unless the authorization is terminated or revoked sooner.     Influenza A by PCR NEGATIVE NEGATIVE Final   Influenza B by PCR NEGATIVE NEGATIVE  Final    Comment: (NOTE) The Xpert Xpress SARS-CoV-2/FLU/RSV plus assay is intended as an aid in the diagnosis of influenza from Nasopharyngeal swab specimens and should not be used as a sole basis for treatment. Nasal washings and aspirates are unacceptable for Xpert Xpress SARS-CoV-2/FLU/RSV testing.  Fact Sheet for Patients: BloggerCourse.com  Fact Sheet for Healthcare Providers: SeriousBroker.it  This test is not yet approved or cleared by the [place] FDA and has been authorized for detection and/or diagnosis of SARS-CoV-2 by FDA under an Emergency Use Authorization (EUA). This EUA will remain in effect (meaning this test can be used) for the duration of the COVID-19 declaration under Section 564(b)(1) of the Act, 21 U.S.C. section 360bbb-3(b)(1), unless the authorization is terminated or revoked.     Resp Syncytial Virus by PCR NEGATIVE NEGATIVE Final    Comment: (NOTE) Fact Sheet for Patients: BloggerCourse.com  Fact Sheet for Healthcare Providers: SeriousBroker.it  This test is not yet approved or cleared by the [place] FDA and has been authorized for detection and/or diagnosis of SARS-CoV-2 by FDA under an Emergency Use Authorization (EUA). This EUA will remain in effect (meaning this test can be used) for the duration of the COVID-19 declaration under Section 564(b)(1) of the Act, 21 U.S.C. section 360bbb-3(b)(1), unless the authorization is terminated or revoked.  Performed at [facility], [address] [place], [place] [address]   Blood Culture (routine x 2)     Status: Abnormal   Collection Time: 12/28/22 12:20 AM   Specimen: BLOOD  Result Value Ref Range Status   Specimen Description   Final    BLOOD  LEFT [facility] Performed at [facility], [address], [place], [place] [address]    Special Requests   Final    BOTTLES DRAWN  AEROBIC AND ANAEROBIC Blood Culture adequate volume Performed at [facility], [address] [place], [place] [address]    Culture  Setup Time   Final    GRAM POSITIVE COCCI ANAEROBIC BOTTLE ONLY Organism ID to follow CRITICAL RESULT CALLED TO, READ BACK BY AND VERIFIED WITH: NATHAN BELUE @ 2204 12/28/22 LFD Performed at [facility], [address] [place], [place] [address]    Culture (A)  Final    STAPHYLOCOCCUS EPIDERMIDIS THE SIGNIFICANCE OF ISOLATING THIS ORGANISM FROM A SINGLE SET OF BLOOD CULTURES WHEN MULTIPLE SETS ARE DRAWN IS UNCERTAIN. PLEASE NOTIFY THE MICROBIOLOGY DEPARTMENT WITHIN ONE WEEK IF SPECIATION AND SENSITIVITIES ARE REQUIRED. Performed at [facility], [address], [place], [place] [address]    Report Status 12/31/2022 FINAL  Final  Blood Culture ID Panel (Reflexed)     Status: Abnormal   Collection Time: 12/28/22 12:20 AM  Result Value Ref Range Status  Enterococcus faecalis NOT DETECTED NOT DETECTED Final   Enterococcus Faecium NOT DETECTED NOT DETECTED Final   Listeria monocytogenes NOT DETECTED NOT DETECTED Final   Staphylococcus species DETECTED (A) NOT DETECTED Final    Comment: CRITICAL RESULT CALLED TO, READ BACK BY AND VERIFIED WITH: NATHAN BELUE @ 2204 12/28/22 LFD    Staphylococcus aureus (BCID) NOT DETECTED NOT DETECTED Final   Staphylococcus epidermidis DETECTED (A) NOT DETECTED Final    Comment: Methicillin (oxacillin) resistant coagulase negative staphylococcus. Possible blood culture contaminant (unless isolated from more than one blood culture draw or clinical case suggests pathogenicity). No antibiotic treatment is indicated for blood  culture contaminants. CRITICAL RESULT CALLED TO, READ BACK BY AND VERIFIED WITH: NATHAN BELUE @ 2204 12/28/22 LFD    Staphylococcus lugdunensis NOT DETECTED NOT DETECTED Final   Streptococcus species NOT DETECTED NOT DETECTED Final   Streptococcus agalactiae NOT DETECTED NOT  DETECTED Final   Streptococcus pneumoniae NOT DETECTED NOT DETECTED Final   Streptococcus pyogenes NOT DETECTED NOT DETECTED Final   A.calcoaceticus-baumannii NOT DETECTED NOT DETECTED Final   Bacteroides fragilis NOT DETECTED NOT DETECTED Final   Enterobacterales NOT DETECTED NOT DETECTED Final   Enterobacter cloacae complex NOT DETECTED NOT DETECTED Final   Escherichia coli NOT DETECTED NOT DETECTED Final   Klebsiella aerogenes NOT DETECTED NOT DETECTED Final   Klebsiella oxytoca NOT DETECTED NOT DETECTED Final   Klebsiella pneumoniae NOT DETECTED NOT DETECTED Final   Proteus species NOT DETECTED NOT DETECTED Final   Salmonella species NOT DETECTED NOT DETECTED Final   Serratia marcescens NOT DETECTED NOT DETECTED Final   Haemophilus influenzae NOT DETECTED NOT DETECTED Final   Neisseria meningitidis NOT DETECTED NOT DETECTED Final   Pseudomonas aeruginosa NOT DETECTED NOT DETECTED Final   Stenotrophomonas maltophilia NOT DETECTED NOT DETECTED Final   Candida albicans NOT DETECTED NOT DETECTED Final   Candida auris NOT DETECTED NOT DETECTED Final   Candida glabrata NOT DETECTED NOT DETECTED Final   Candida krusei NOT DETECTED NOT DETECTED Final   Candida parapsilosis NOT DETECTED NOT DETECTED Final   Candida tropicalis NOT DETECTED NOT DETECTED Final   Cryptococcus neoformans/gattii NOT DETECTED NOT DETECTED Final   Methicillin resistance mecA/C DETECTED (A) NOT DETECTED Final    Comment: CRITICAL RESULT CALLED TO, READ BACK BY AND VERIFIED WITH: NATHAN BELUE @ 2204 12/28/22 LFD Performed at [facility], [address] [place], [place] [address]   Blood Culture (routine x 2)     Status: None   Collection Time: 12/28/22  1:06 AM   Specimen: BLOOD  Result Value Ref Range Status   Specimen Description BLOOD RIGHT FOREARM  Final   Special Requests   Final    BOTTLES DRAWN AEROBIC AND ANAEROBIC Blood Culture results may not be optimal due to an inadequate volume of blood  received in culture bottles   Culture   Final    NO GROWTH 5 DAYS Performed at [facility], [address] [place], [place] [address]    Report Status 01/02/2023 FINAL  Final  C Difficile Quick Screen w PCR reflex     Status: Abnormal   Collection Time: 12/29/22  1:01 PM   Specimen: STOOL  Result Value Ref Range Status   C Diff antigen POSITIVE (A) NEGATIVE Final   C Diff toxin POSITIVE (A) NEGATIVE Final   C Diff interpretation Toxin producing C. difficile detected.  Final    Comment: CRITICAL RESULT CALLED TO, READ BACK BY AND VERIFIED WITH: KATIE CLAYTON  AT 1432 12/29/22.PMF Performed at [facility], [address], [place], [place] [address]   Urine Culture     Status: Abnormal   Collection Time: 12/30/22 11:43 PM   Specimen: Urine, Random  Result Value Ref Range Status   Specimen Description   Final    URINE, RANDOM Performed at [facility], [address] [place], [place] [address]    Special Requests   Final    NONE Reflexed from [id number] Performed at [facility], [address] [place], [place] [address]    Culture (A)  Final    >=100,000 COLONIES/mL KLEBSIELLA OXYTOCA 70,000 COLONIES/mL ENTEROCOCCUS FAECALIS Confirmed Extended Spectrum Beta-Lactamase Producer (ESBL).  In bloodstream infections from ESBL organisms, carbapenems are preferred over piperacillin/tazobactam. They are shown to have a lower risk of mortality.    Report Status 01/02/2023 FINAL  Final   Organism ID, Bacteria KLEBSIELLA OXYTOCA (A)  Final   Organism ID, Bacteria ENTEROCOCCUS FAECALIS (A)  Final      Susceptibility   Enterococcus faecalis - MIC*    AMPICILLIN <=2 SENSITIVE Sensitive     NITROFURANTOIN <=16 SENSITIVE Sensitive     VANCOMYCIN 1 SENSITIVE Sensitive     * 70,000 COLONIES/mL ENTEROCOCCUS FAECALIS   Klebsiella oxytoca - MIC*    AMPICILLIN >=32 RESISTANT Resistant     CEFEPIME >=32 RESISTANT Resistant     CEFTRIAXONE >=64 RESISTANT  Resistant     CIPROFLOXACIN 1 RESISTANT Resistant     GENTAMICIN >=16 RESISTANT Resistant     IMIPENEM <=0.25 SENSITIVE Sensitive     NITROFURANTOIN 32 SENSITIVE Sensitive     TRIMETH/SULFA >=320 RESISTANT Resistant     AMPICILLIN/SULBACTAM >=32 RESISTANT Resistant     PIP/TAZO <=4 SENSITIVE Sensitive     * >=100,000 COLONIES/mL KLEBSIELLA OXYTOCA  MRSA Next Gen by PCR, Nasal     Status: None   Collection Time: 12/31/22 12:46 AM   Specimen: Nasal Mucosa; Nasal Swab  Result Value Ref Range Status   MRSA by PCR Next Gen NOT DETECTED NOT DETECTED Final    Comment: (NOTE) The GeneXpert MRSA Assay (FDA approved for NASAL specimens only), is one component of a comprehensive MRSA colonization surveillance program. It is not intended to diagnose MRSA infection nor to guide or monitor treatment for MRSA infections. Test performance is not FDA approved in patients less than 23 years old. Performed at [facility], [address] [place], [place] [address]     Coagulation Studies: No results for input(s): "LABPROT", "INR" in the last 72 hours.  Urinalysis: Recent Labs    01/02/23 1311  COLORURINE YELLOW*  LABSPEC 1.008  PHURINE 5.0  GLUCOSEU NEGATIVE  HGBUR SMALL*  BILIRUBINUR NEGATIVE  KETONESUR NEGATIVE  PROTEINUR NEGATIVE  NITRITE NEGATIVE  LEUKOCYTESUR LARGE*      Imaging: CT HIP RIGHT WO CONTRAST  Result Date: 01/03/2023 CLINICAL DATA:  Chronic right hip pain. Prior right hemiarthroplasty 07/02/2022. Right hip pain and swelling. EXAM: CT OF THE RIGHT HIP WITHOUT CONTRAST TECHNIQUE: Multidetector CT imaging of the right hip was performed according to the standard protocol. Multiplanar CT image reconstructions were also generated. RADIATION DOSE REDUCTION: This exam was performed according to the departmental dose-optimization program which includes automated exposure control, adjustment of the mA and/or kV according to patient size and/or use of iterative  reconstruction technique. COMPARISON:  right hip radiographs 10/18/2021, CT abdomen pelvis 12/28/2022, right hip radiographs 10/18/2021, CT abdomen pelvis 07/18/2018 FINDINGS: Bones/Joint/Cartilage Postsurgical changes are again seen of total right  hip arthroplasty, similar to recent 12/27/2022 CT and again representing a revision from more remote studies. The right acetabular cup again medially protrudes through the now completely absent medial acetabular wall by approximately 6-8 mm. There is again a superior acetabular screw that extends up to approximately 3.7 cm anteriorly and superiorly into the right hemipelvis, and close to the posterior cecal wall (sagittal series 8, image 98). There is diffuse decreased bone mineralization. Within the limitation of metallic streak artifact, no definite perihardware lucency is seen to indicate hardware failure or loosening. There is severe anterior inferior right sacroiliac osteoarthritis with bone-on-bone contact and high-grade subchondral degenerative cysts. Moderate to severe pubic symphysis joint space narrowing, subchondral sclerosis, and peripheral osteophytosis. Within the limitation of diffuse decreased bone mineralization, no definitive acute fracture is seen. Ligaments Suboptimally assessed by CT. Muscles and Tendons There is mild fatty infiltration of the partially visualized right gluteus minimus and medius muscles and the vastus lateralis quadriceps muscle. No definitive tendon tear is seen. Soft tissues There is moderate edema and swelling of the subcutaneous fat of the visualized pelvis and proximal thigh diffusely, appearing increased from 12/27/2022. IMPRESSION: 1. Status post total right hip arthroplasty, similar to recent 12/27/2022 CT and again representing a revision from more remote studies. There is again a superior acetabular screw that extends up to approximately 3.7 cm anteriorly and superiorly into the right hemipelvis, and close to the posterior  cecal wall. 2. Within the limitation of diffuse decreased bone mineralization, no definitive acute fracture is seen. 3. Severe anterior inferior right sacroiliac osteoarthritis. 4. 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 5. Moderate edema and swelling of the subcutaneous fat of the visualized pelvis and proximal thigh, appearing increased from 12/27/2022. Electronically Signed   By: Neita Garnet M.D.   On: 01/03/2023 19:40   US Venous Img Lower Bilateral (DVT)  Result Date: 01/03/2023 CLINICAL DATA:  Bilateral lower extremity pain and edema. Remote history of cervical cancer. Evaluate for DVT. EXAM: BILATERAL LOWER EXTREMITY VENOUS DOPPLER ULTRASOUND TECHNIQUE: Gray-scale sonography with graded compression, as well as color Doppler and duplex ultrasound were performed to evaluate the lower extremity deep venous systems from the level of the common femoral vein and including the common femoral, femoral, profunda femoral, popliteal and calf veins including the posterior tibial, peroneal and gastrocnemius veins when visible. The superficial great saphenous vein was also interrogated. Spectral Doppler was utilized to evaluate flow at rest and with distal augmentation maneuvers in the common femoral, femoral and popliteal veins. COMPARISON:  None Available. FINDINGS: RIGHT LOWER EXTREMITY Common Femoral Vein: No evidence of thrombus. Normal compressibility, respiratory phasicity and response to augmentation. Saphenofemoral Junction: No evidence of thrombus. Normal compressibility and flow on color Doppler imaging. Profunda Femoral Vein: No evidence of thrombus. Normal compressibility and flow on color Doppler imaging. Femoral Vein: No evidence of thrombus. Normal compressibility, respiratory phasicity and response to augmentation. Popliteal Vein: No evidence of thrombus. Normal compressibility, respiratory phasicity and response to augmentation. Calf Veins: No evidence of thrombus. Normal compressibility  and flow on color Doppler imaging. Superficial Great Saphenous Vein: There is age-indeterminate nonocclusive wall thickening/DVT involving the distal aspect of the greater saphenous vein (images 40 and 43). Other Findings: There is a minimal amount of subcutaneous edema at the level of the right lower leg and calf. LEFT LOWER EXTREMITY Common Femoral Vein: No evidence of thrombus. Normal compressibility, respiratory phasicity and response to augmentation. Saphenofemoral Junction: No evidence of thrombus. Normal compressibility and flow on color Doppler imaging. Profunda  Femoral Vein: No evidence of thrombus. Normal compressibility and flow on color Doppler imaging. Femoral Vein: No evidence of thrombus. Normal compressibility, respiratory phasicity and response to augmentation. Popliteal Vein: No evidence of thrombus. Normal compressibility, respiratory phasicity and response to augmentation. Calf Veins: No evidence of thrombus. Normal compressibility and flow on color Doppler imaging. Superficial Great Saphenous Vein: No evidence of thrombus. Normal compressibility. Other Findings: There is a minimal amount of subcutaneous edema at the level of the left calf. IMPRESSION: 1. No evidence of DVT within either lower extremity. 2. Age-indeterminate, though potentially chronic, nonocclusive wall thickening/SVT involving the distal aspect of the right greater saphenous vein. Clinical correlation is advised. Electronically Signed   By: Simonne Come M.D.   On: 01/03/2023 15:00     Medications:    sodium chloride      (feeding supplement) PROSource Plus  30 mL Oral TID BM   vitamin C  500 mg Oral BID   cholecalciferol  1,000 Units Oral Daily   enoxaparin (LOVENOX) injection  40 mg Subcutaneous Q24H   furosemide  40 mg Intravenous Daily   gabapentin  100 mg Oral QHS   Gerhardt's butt cream   Topical Daily   midodrine  5 mg Oral TID WC   multivitamin with minerals  1 tablet Oral Daily   nutrition supplement  (JUVEN)  1 packet Oral BID BM   sodium bicarbonate  650 mg Oral BID   vancomycin  125 mg Oral QID   Followed by   Melene Muller ON 01/12/2023] vancomycin  125 mg Oral BID   Followed by   Melene Muller ON 01/20/2023] vancomycin  125 mg Oral Daily   Followed by   Melene Muller ON 01/27/2023] vancomycin  125 mg Oral QODAY   Followed by   Melene Muller ON 02/04/2023] vancomycin  125 mg Oral Q3 days   zinc sulfate  220 mg Oral Daily   ondansetron (ZOFRAN) IV, mouth rinse, oxyCODONE, polyethylene glycol, promethazine  Assessment/ Plan:  72 y.o. female with hypertension, cervical cancer, rheumatoid arthritis, history of C. difficile, CKD   admitted on 12/27/2022 for Shock circulatory (HCC) [R57.9] Sepsis with hypotension (HCC) [A41.9, I95.9] Abdominal pain, unspecified abdominal location [R10.9] Diarrhea, unspecified type [R19.7]  Acute kidney injury on chronic kidney disease stage IIIb.  Baseline creatinine of 1.35/GFR 43 from 11/11/2022. AKI likely secondary to IV contrast nephropathy, hypotension, concurrent illness and nonsteroidal use. Avoid further IV contrast exposure.  Avoid nonsteroidals. Renal imaging-12/28/2022-CT abdomen pelvis with contrast. -severe left renal atrophy, progressive.  Moderate left hydroureteronephrosis, chronic.  No convincing ureteral calculus. Right renal cortical scaring noted.  Nonobstructing renal calculi measuring 2 to 3 mm.  No hydronephrosis noted on right. Left hydroureteronephrosis appears to be chronic.  Unlikely causing acute change in creatinine. Mild increase in serum creatinine is noted but patient reports urine output is improving. Will observe for now.  No IV fluids.  Currently getting IV furosemide at 40 mg daily. Avoid hypotension.  Agree with midodrine.  Generalized edema The third spacing of fluid Low albumin of 2.1. Will add low-dose IV albumin to her regimen.   LOS: 8 Jesus Nevills Thedore Mins 4/27/20248:51 AM  [facility] [place],  [place] [phone]  Note: This note was prepared with Dragon dictation. Any transcription errors are unintentional

## 2023-01-06 DIAGNOSIS — A0472 Enterocolitis due to Clostridium difficile, not specified as recurrent: Secondary | ICD-10-CM | POA: Diagnosis not present

## 2023-01-06 DIAGNOSIS — K5100 Ulcerative (chronic) pancolitis without complications: Secondary | ICD-10-CM | POA: Diagnosis not present

## 2023-01-06 LAB — BASIC METABOLIC PANEL
Anion gap: 6 (ref 5–15)
BUN: 42 mg/dL — ABNORMAL HIGH (ref 8–23)
CO2: 16 mmol/L — ABNORMAL LOW (ref 22–32)
Calcium: 7.6 mg/dL — ABNORMAL LOW (ref 8.9–10.3)
Chloride: 116 mmol/L — ABNORMAL HIGH (ref 98–111)
Creatinine, Ser: 1.77 mg/dL — ABNORMAL HIGH (ref 0.44–1.00)
GFR, Estimated: 31 mL/min — ABNORMAL LOW (ref >60)
Glucose, Bld: 93 mg/dL (ref 70–99)
Potassium: 3.8 mmol/L (ref 3.5–5.1)
Sodium: 138 mmol/L (ref 135–145)

## 2023-01-06 MED ORDER — NYSTATIN 100000 UNIT/GM EX POWD
Freq: Three times a day (TID) | CUTANEOUS | Status: DC
  Filled 2023-01-06 (×2): qty 15

## 2023-01-06 NOTE — Plan of Care (Signed)

## 2023-01-06 NOTE — Progress Notes (Addendum)
Progress Note    Jeanne [name]  ZHY:[id number] DOB: [birth year]  DOA: 12/27/2022 PCP: Jerl Mina, MD      Brief Narrative:    Medical records reviewed and are as summarized below:  Jeanne [name] is a 72 y.o. female with medical history significant for cervical cancer, rheumatoid arthritis, hypertension, nephrolithiasis, left UPJ urolithiasis, CKD stage IIIb. Of note the patient was recently hospitalized with C.Diff from 11/09/22-11/12/22, discharged with PO vancomycin.  She had previously been taking Bactrim at that time.  She states she completed this antibiotic course as prescribed. She reported that she did have resolution of her symptoms for a few weeks. She also had COVID 19 + during this admission, but was asymptomatic and did not require treatment.   She presented to the hospital because of diarrhea.      Assessment/Plan:   Principal Problem:   Pancolitis (HCC) Active Problems:   Clostridium difficile diarrhea   COVID-19 virus infection   AKI (acute kidney injury) (HCC)   History of Clostridioides difficile colitis   Hypotension   CKD stage 3b, GFR 30-44 ml/min (HCC)   Nutrition Problem: Inadequate oral intake Etiology: acute illness  Signs/Symptoms: per patient/family report   Body mass index is 31.79 kg/m.  (Obesity)    Recurrent C. difficile colitis, pancolitis on CT abdomen, recent C. difficile infection on 11/09/2022: Stools are less watery.  Continue oral vancomycin taper.     Persistent hypotension: Improved.  Continue midodrine.  S/p 2 days of IV hydrocortisone from 01/01/2022.    AKI on CKD stage IIIb, normal anion gap metabolic acidosis: Creatinine is trending down.  She remains on IV Lasix.  Follow-up with nephrologist. Renal ultrasound showed bilateral renal cortical thinning with medical renal disease changes, mild bilateral hydronephrosis with, small amount of ascites and small pleural effusions.  Continue oral sodium bicarbonate for  metabolic acidosis. Diclofenac was discontinued on 01/02/2023.   Rheumatoid arthritis: Oxycodone as needed for pain.  Diclofenac was discontinued on 01/02/2023    Peripheral edema/anasarca: She said she's had lower extremity swelling since surgery in October 2023.  However, it appears lower extremity swelling has worsened.  Suspect some of the swelling may be due to fluid overload.  S/p IV Lasix 20 mg x 1 dose on 01/03/2023. She has been started on IV Lasix by the nephrologist. Venous duplex did not show any evidence of acute DVT.  Age-indeterminate nonocclusive wall thickening/SVT involving the distal aspect of the greater saphenous vein.  2D echo was unremarkable.  EF estimated at 60 to 65%.   Right total hip arthroplasty on 07/02/2022: Patient says she has not been able to walk since her surgery.  She gets physical therapy at home. CT right hip did not show any acute fracture.  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 Attempt to transfer patient to [facility] proved futile.  I called the [facility] transfer line on 01/03/2023.  I was told that [facility] is at capacity and cannot accept transfer  to the medical team, and also there was no medical reason for patient to be transferred to [facility]..  Since there is no acute orthopedic issue, patient cannot be transferred to the orthopedic service.  Outpatient follow-up with Dr. Leontine Locket after discharge from the hospital.   History of COVID-19 infection: Positive COVID test on 11/09/2022 and repeat COVID test positive on  12/28/2022.    History of nephrolithiasis and left urolithiasis.       Diet Order             Diet regular Fluid consistency: Thin  Diet effective now                            Consultants: Intensivist Infectious disease  specialist Nephrologist  Procedures: None    Medications:    (feeding supplement) PROSource Plus  30 mL Oral TID BM   vitamin C  500 mg Oral BID   cholecalciferol  1,000 Units Oral Daily   enoxaparin (LOVENOX) injection  30 mg Subcutaneous Q24H   furosemide  40 mg Intravenous Daily   gabapentin  100 mg Oral QHS   Gerhardt's butt cream   Topical Daily   midodrine  5 mg Oral TID WC   multivitamin with minerals  1 tablet Oral Daily   nutrition supplement (JUVEN)  1 packet Oral BID BM   nystatin   Topical TID   vancomycin  125 mg Oral QID   Followed by   Melene Muller ON 01/12/2023] vancomycin  125 mg Oral BID   Followed by   Melene Muller ON 01/20/2023] vancomycin  125 mg Oral Daily   Followed by   Melene Muller ON 01/27/2023] vancomycin  125 mg Oral QODAY   Followed by   Melene Muller ON 02/04/2023] vancomycin  125 mg Oral Q3 days   zinc sulfate  220 mg Oral Daily   Continuous Infusions:  sodium chloride       Anti-infectives (From admission, onward)    Start     Dose/Rate Route Frequency Ordered Stop   02/04/23 1000  vancomycin (VANCOCIN) capsule 125 mg       See Hyperspace for full Linked Orders Report.   125 mg Oral Every 3 DAYS 12/31/22 1243 02/13/23 0959   01/27/23 1000  vancomycin (VANCOCIN) capsule 125 mg       See Hyperspace for full Linked Orders Report.   125 mg Oral Every other day 12/31/22 1243 02/04/23 0959   01/20/23 1000  vancomycin (VANCOCIN) capsule 125 mg       See Hyperspace for full Linked Orders Report.   125 mg Oral Daily 12/31/22 1243 01/27/23 0959   01/12/23 2200  vancomycin (VANCOCIN) capsule 125 mg       See Hyperspace for full Linked Orders Report.   125 mg Oral 2 times daily 12/31/22 1243 01/19/23 2159   12/31/22 1800  vancomycin (VANCOCIN) capsule 125 mg       See Hyperspace for full Linked Orders Report.   125 mg Oral 4 times daily 12/31/22 1243 01/12/23 1759   12/28/22 1415  vancomycin (VANCOCIN) 50 mg/mL oral solution SOLN 125 mg  Status:  Discontinued        125  mg Oral Every 6 hours 12/28/22 1323 12/31/22 1243   12/28/22 1000  metroNIDAZOLE (FLAGYL) IVPB 500 mg  Status:  Discontinued        500 mg 100 mL/hr over 60 Minutes Intravenous Every 12 hours 12/28/22 0410 12/28/22 1426   12/27/22 2345  metroNIDAZOLE (FLAGYL) IVPB 500 mg        500 mg 100 mL/hr over 60 Minutes Intravenous  Once 12/27/22 2344 12/28/22 0246              Family Communication/Anticipated D/C date and plan/Code Status   DVT prophylaxis: enoxaparin (LOVENOX) injection 30 mg Start: 01/06/23 0800 SCDs Start:  12/28/22 0259     Code Status: Full Code  Family Communication: None Disposition Plan: Plans to discharge home in 2 to 3 days   Status is: Inpatient Remains inpatient appropriate because: Recurrent C. difficile colitis and low blood pressure       Subjective:   She had 1 mushy stools this morning.  No abdominal pain.  She feels a little better today.  She has noticed improvement in swelling in her forearms and hands.  She still has significant swelling in the lower extremities.  She has noticed increased urine output.  Objective:    Vitals:   01/06/23 0500 01/06/23 0545 01/06/23 1000 01/06/23 1100  BP:  115/65 130/65   Pulse:  86 (!) 113 (!) 103  Resp:  18 18   Temp:  98.4 F (36.9 C) 98.7 F (37.1 C)   TempSrc:  Oral Oral   SpO2:   100%   Weight: 84 kg     Height:       No data found.   Intake/Output Summary (Last 24 hours) at 01/06/2023 1253 Last data filed at 01/06/2023 0300 Gross per 24 hour  Intake 360 ml  Output 1500 ml  Net -1140 ml   Filed Weights   01/04/23 0500 01/05/23 0346 01/06/23 0500  Weight: 87.9 kg 84.1 kg 84 kg    Exam:   GEN: NAD SKIN: Warm and dry EYES: Anicteric ENT: MMM CV: RRR PULM: CTA B ABD: soft, obese/distended, NT, +BS CNS: AAO x 3, non focal EXT: Bilateral lower extremity edema (2+), no erythema or tenderness      Data Reviewed:   I have personally reviewed following labs and imaging  studies:  Labs: Labs show the following:   Basic Metabolic Panel: Recent Labs  Lab 12/31/22 0425 01/01/23 0432 01/02/23 0406 01/03/23 0604 01/04/23 0320 01/05/23 0531 01/06/23 0535  NA 139   < > 137 137 136 137 138  K 3.8   < > 4.2 4.2 4.3 4.0 3.8  CL 119*   < > 115* 116* 115* 115* 116*  CO2 15*   < > 18* 16* 17* 16* 16*  GLUCOSE 108*   < > 110* 154* 151* 92 93  BUN 10   < > 26* 28* 34* 38* 42*  CREATININE 1.02*   < > 1.39* 1.58* 1.88* 2.07* 1.77*  CALCIUM 7.3*   < > 7.6* 7.6* 7.7* 7.8* 7.6*  MG 1.9  --  1.8  --   --   --   --   PHOS 3.2  --  2.9  --   --   --   --    < > = values in this interval not displayed.   GFR Estimated Creatinine Clearance: 31 mL/min (A) (by C-G formula based on SCr of 1.77 mg/dL (H)). Liver Function Tests: Recent Labs  Lab 01/04/23 0320  AST 17  ALT 9  ALKPHOS 86  BILITOT 0.5  PROT 4.9*  ALBUMIN 2.1*   No results for input(s): "LIPASE", "AMYLASE" in the last 168 hours. No results for input(s): "AMMONIA" in the last 168 hours. Coagulation profile No results for input(s): "INR", "PROTIME" in the last 168 hours.  CBC: Recent Labs  Lab 12/31/22 0425 01/01/23 0432 01/02/23 0406 01/03/23 0604 01/05/23 0531  WBC 16.8* 14.3* 14.1* 10.9* 13.5*  NEUTROABS  --   --   --  10.1* 10.6*  HGB 9.1* 9.4* 10.7* 9.7* 9.3*  HCT 29.9* 30.7* 34.9* 31.6* 30.3*  MCV 90.3 90.6 89.7  88.5 89.6  PLT 338 367 397 361 347   Cardiac Enzymes: No results for input(s): "CKTOTAL", "CKMB", "CKMBINDEX", "TROPONINI" in the last 168 hours. BNP (last 3 results) No results for input(s): "PROBNP" in the last 8760 hours. CBG: Recent Labs  Lab 01/02/23 1819 01/03/23 0853 01/03/23 1153 01/03/23 1615 01/04/23 2123  GLUCAP 113* 132* 131* 142* 111*   D-Dimer: No results for input(s): "DDIMER" in the last 72 hours. Hgb A1c: No results for input(s): "HGBA1C" in the last 72 hours. Lipid Profile: No results for input(s): "CHOL", "HDL", "LDLCALC", "TRIG", "CHOLHDL",  "LDLDIRECT" in the last 72 hours. Thyroid function studies: No results for input(s): "TSH", "T4TOTAL", "T3FREE", "THYROIDAB" in the last 72 hours.  Invalid input(s): "FREET3" Anemia work up: No results for input(s): "VITAMINB12", "FOLATE", "FERRITIN", "TIBC", "IRON", "RETICCTPCT" in the last 72 hours. Sepsis Labs: Recent Labs  Lab 01/01/23 0432 01/02/23 0406 01/02/23 1417 01/02/23 1658 01/03/23 0604 01/05/23 0531  WBC 14.3* 14.1*  --   --  10.9* 13.5*  LATICACIDVEN  --   --  1.4 1.3  --   --     Microbiology Recent Results (from the past 240 hour(s))  Resp panel by RT-PCR (RSV, Flu A&B, Covid) Anterior Nasal Swab     Status: Abnormal   Collection Time: 12/28/22 12:20 AM   Specimen: Anterior Nasal Swab  Result Value Ref Range Status   SARS Coronavirus 2 by RT PCR POSITIVE (A) NEGATIVE Final    Comment: (NOTE) SARS-CoV-2 target nucleic acids are DETECTED.  The SARS-CoV-2 RNA is generally detectable in upper respiratory specimens during the acute phase of infection. Positive results are indicative of the presence of the identified virus, but do not rule out bacterial infection or co-infection with other pathogens not detected by the test. Clinical correlation with patient history and other diagnostic information is necessary to determine patient infection status. The expected result is Negative.  Fact Sheet for Patients: BloggerCourse.com  Fact Sheet for Healthcare Providers: SeriousBroker.it  This test is not yet approved or cleared by the [place] FDA and  has been authorized for detection and/or diagnosis of SARS-CoV-2 by FDA under an Emergency Use Authorization (EUA).  This EUA will remain in effect (meaning this test can be used) for the duration of  the COVID-19 declaration under Section 564(b)(1) of the A ct, 21 U.S.C. section 360bbb-3(b)(1), unless the authorization is terminated or revoked sooner.      Influenza A by PCR NEGATIVE NEGATIVE Final   Influenza B by PCR NEGATIVE NEGATIVE Final    Comment: (NOTE) The Xpert Xpress SARS-CoV-2/FLU/RSV plus assay is intended as an aid in the diagnosis of influenza from Nasopharyngeal swab specimens and should not be used as a sole basis for treatment. Nasal washings and aspirates are unacceptable for Xpert Xpress SARS-CoV-2/FLU/RSV testing.  Fact Sheet for Patients: BloggerCourse.com  Fact Sheet for Healthcare Providers: SeriousBroker.it  This test is not yet approved or cleared by the [place] FDA and has been authorized for detection and/or diagnosis of SARS-CoV-2 by FDA under an Emergency Use Authorization (EUA). This EUA will remain in effect (meaning this test can be used) for the duration of the COVID-19 declaration under Section 564(b)(1) of the Act, 21 U.S.C. section 360bbb-3(b)(1), unless the authorization is terminated or revoked.     Resp Syncytial Virus by PCR NEGATIVE NEGATIVE Final    Comment: (NOTE) Fact Sheet for Patients: BloggerCourse.com  Fact Sheet for Healthcare Providers: SeriousBroker.it  This test is not yet approved or cleared by  the [employer] and has been authorized for detection and/or diagnosis of SARS-CoV-2 by FDA under an Emergency Use Authorization (EUA). This EUA will remain in effect (meaning this test can be used) for the duration of the COVID-19 declaration under Section 564(b)(1) of the Act, 21 U.S.C. section 360bbb-3(b)(1), unless the authorization is terminated or revoked.  Performed at [facility], [address] [place], [place] [address]   Blood Culture (routine x 2)     Status: Abnormal   Collection Time: 12/28/22 12:20 AM   Specimen: BLOOD  Result Value Ref Range Status   Specimen Description   Final    BLOOD  LEFT [facility] Performed at [facility], [address] [place], [place] [address]    Special Requests   Final    BOTTLES DRAWN AEROBIC AND ANAEROBIC Blood Culture adequate volume Performed at [facility], [address] [place], [place] [address]    Culture  Setup Time   Final    GRAM POSITIVE COCCI ANAEROBIC BOTTLE ONLY Organism ID to follow CRITICAL RESULT CALLED TO, READ BACK BY AND VERIFIED WITH: NATHAN BELUE @ 2204 12/28/22 LFD Performed at [facility], [address] [place], [place] [address]    Culture (A)  Final    STAPHYLOCOCCUS EPIDERMIDIS THE SIGNIFICANCE OF ISOLATING THIS ORGANISM FROM A SINGLE SET OF BLOOD CULTURES WHEN MULTIPLE SETS ARE DRAWN IS UNCERTAIN. PLEASE NOTIFY THE MICROBIOLOGY DEPARTMENT WITHIN ONE WEEK IF SPECIATION AND SENSITIVITIES ARE REQUIRED. Performed at [facility], [address] [place], [place] [address]    Report Status 12/31/2022 FINAL  Final  Blood Culture ID Panel (Reflexed)     Status: Abnormal   Collection Time: 12/28/22 12:20 AM  Result Value Ref Range Status   Enterococcus faecalis NOT DETECTED NOT DETECTED Final   Enterococcus Faecium NOT DETECTED NOT DETECTED Final   Listeria monocytogenes NOT DETECTED NOT DETECTED Final   Staphylococcus species DETECTED (A) NOT DETECTED Final    Comment: CRITICAL RESULT CALLED TO, READ BACK BY AND VERIFIED WITH: NATHAN BELUE @ 2204 12/28/22 LFD    Staphylococcus aureus (BCID) NOT DETECTED NOT DETECTED Final   Staphylococcus epidermidis DETECTED (A) NOT DETECTED Final    Comment: Methicillin (oxacillin) resistant coagulase negative staphylococcus. Possible blood culture contaminant (unless isolated from more than one blood culture draw or clinical case suggests pathogenicity). No antibiotic treatment is indicated for blood  culture contaminants. CRITICAL RESULT CALLED TO, READ BACK BY AND VERIFIED WITH: NATHAN BELUE @ 2204 12/28/22 LFD    Staphylococcus lugdunensis NOT DETECTED NOT DETECTED Final    Streptococcus species NOT DETECTED NOT DETECTED Final   Streptococcus agalactiae NOT DETECTED NOT DETECTED Final   Streptococcus pneumoniae NOT DETECTED NOT DETECTED Final   Streptococcus pyogenes NOT DETECTED NOT DETECTED Final   A.calcoaceticus-baumannii NOT DETECTED NOT DETECTED Final   Bacteroides fragilis NOT DETECTED NOT DETECTED Final   Enterobacterales NOT DETECTED NOT DETECTED Final   Enterobacter cloacae complex NOT DETECTED NOT DETECTED Final   Escherichia coli NOT DETECTED NOT DETECTED Final   Klebsiella aerogenes NOT DETECTED NOT DETECTED Final   Klebsiella oxytoca NOT DETECTED NOT DETECTED Final   Klebsiella pneumoniae NOT DETECTED NOT DETECTED Final   Proteus species NOT DETECTED NOT DETECTED Final   Salmonella species NOT DETECTED NOT DETECTED Final   Serratia marcescens NOT DETECTED NOT DETECTED Final   Haemophilus influenzae NOT DETECTED NOT DETECTED Final   Neisseria meningitidis NOT DETECTED NOT DETECTED Final   Pseudomonas  aeruginosa NOT DETECTED NOT DETECTED Final   Stenotrophomonas maltophilia NOT DETECTED NOT DETECTED Final   Candida albicans NOT DETECTED NOT DETECTED Final   Candida auris NOT DETECTED NOT DETECTED Final   Candida glabrata NOT DETECTED NOT DETECTED Final   Candida krusei NOT DETECTED NOT DETECTED Final   Candida parapsilosis NOT DETECTED NOT DETECTED Final   Candida tropicalis NOT DETECTED NOT DETECTED Final   Cryptococcus neoformans/gattii NOT DETECTED NOT DETECTED Final   Methicillin resistance mecA/C DETECTED (A) NOT DETECTED Final    Comment: CRITICAL RESULT CALLED TO, READ BACK BY AND VERIFIED WITH: NATHAN BELUE @ 2204 12/28/22 LFD Performed at [facility], [address] [place], [place] [address]   Blood Culture (routine x 2)     Status: None   Collection Time: 12/28/22  1:06 AM   Specimen: BLOOD  Result Value Ref Range Status   Specimen Description BLOOD RIGHT FOREARM  Final   Special Requests   Final    BOTTLES DRAWN  AEROBIC AND ANAEROBIC Blood Culture results may not be optimal due to an inadequate volume of blood received in culture bottles   Culture   Final    NO GROWTH 5 DAYS Performed at [facility], [address] [place], [place] [address]    Report Status 01/02/2023 FINAL  Final  C Difficile Quick Screen w PCR reflex     Status: Abnormal   Collection Time: 12/29/22  1:01 PM   Specimen: STOOL  Result Value Ref Range Status   C Diff antigen POSITIVE (A) NEGATIVE Final   C Diff toxin POSITIVE (A) NEGATIVE Final   C Diff interpretation Toxin producing C. difficile detected.  Final    Comment: CRITICAL RESULT CALLED TO, READ BACK BY AND VERIFIED WITH: KATIE CLAYTON AT 1432 12/29/22.PMF Performed at [facility], [address] [place], [place] [address]   Urine Culture     Status: Abnormal   Collection Time: 12/30/22 11:43 PM   Specimen: Urine, Random  Result Value Ref Range Status   Specimen Description   Final    URINE, RANDOM Performed at [facility], [address] [place], [place] [address]    Special Requests   Final    NONE Reflexed from [id number] Performed at [facility], [address] [place], [place] [address]    Culture (A)  Final    >=100,000 COLONIES/mL KLEBSIELLA OXYTOCA 70,000 COLONIES/mL ENTEROCOCCUS FAECALIS Confirmed Extended Spectrum Beta-Lactamase Producer (ESBL).  In bloodstream infections from ESBL organisms, carbapenems are preferred over piperacillin/tazobactam. They are shown to have a lower risk of mortality.    Report Status 01/02/2023 FINAL  Final   Organism ID, Bacteria KLEBSIELLA OXYTOCA (A)  Final   Organism ID, Bacteria ENTEROCOCCUS FAECALIS (A)  Final      Susceptibility   Enterococcus faecalis - MIC*    AMPICILLIN <=2 SENSITIVE Sensitive     NITROFURANTOIN <=16 SENSITIVE Sensitive     VANCOMYCIN 1 SENSITIVE Sensitive     * 70,000 COLONIES/mL ENTEROCOCCUS FAECALIS   Klebsiella oxytoca - MIC*    AMPICILLIN  >=32 RESISTANT Resistant     CEFEPIME >=32 RESISTANT Resistant     CEFTRIAXONE >=64 RESISTANT Resistant     CIPROFLOXACIN 1 RESISTANT Resistant     GENTAMICIN >=16 RESISTANT Resistant     IMIPENEM <=0.25 SENSITIVE Sensitive     NITROFURANTOIN 32 SENSITIVE Sensitive     TRIMETH/SULFA >=320 RESISTANT Resistant     AMPICILLIN/SULBACTAM >=32 RESISTANT Resistant  PIP/TAZO <=4 SENSITIVE Sensitive     * >=100,000 COLONIES/mL KLEBSIELLA OXYTOCA  MRSA Next Gen by PCR, Nasal     Status: None   Collection Time: 12/31/22 12:46 AM   Specimen: Nasal Mucosa; Nasal Swab  Result Value Ref Range Status   MRSA by PCR Next Gen NOT DETECTED NOT DETECTED Final    Comment: (NOTE) The GeneXpert MRSA Assay (FDA approved for NASAL specimens only), is one component of a comprehensive MRSA colonization surveillance program. It is not intended to diagnose MRSA infection nor to guide or monitor treatment for MRSA infections. Test performance is not FDA approved in patients less than 70 years old. Performed at [facility], [address] [place], [place] [address]     Procedures and diagnostic studies:  No results found.             LOS: 9 days   Doneen Ollinger  [employer] loss adjuster on [email or web address]. If 7PM-7AM, please contact night-coverage at [email or web address]     01/06/2023, 12:53 PM

## 2023-01-07 DIAGNOSIS — K5100 Ulcerative (chronic) pancolitis without complications: Secondary | ICD-10-CM | POA: Diagnosis not present

## 2023-01-07 DIAGNOSIS — A0472 Enterocolitis due to Clostridium difficile, not specified as recurrent: Secondary | ICD-10-CM | POA: Diagnosis not present

## 2023-01-07 LAB — BASIC METABOLIC PANEL
Anion gap: 8 (ref 5–15)
BUN: 42 mg/dL — ABNORMAL HIGH (ref 8–23)
CO2: 18 mmol/L — ABNORMAL LOW (ref 22–32)
Calcium: 7.4 mg/dL — ABNORMAL LOW (ref 8.9–10.3)
Chloride: 114 mmol/L — ABNORMAL HIGH (ref 98–111)
Creatinine, Ser: 1.57 mg/dL — ABNORMAL HIGH (ref 0.44–1.00)
GFR, Estimated: 35 mL/min — ABNORMAL LOW (ref >60)
Glucose, Bld: 102 mg/dL — ABNORMAL HIGH (ref 70–99)
Potassium: 3.5 mmol/L (ref 3.5–5.1)
Sodium: 140 mmol/L (ref 135–145)

## 2023-01-07 LAB — CBC WITH DIFFERENTIAL/PLATELET
Abs Immature Granulocytes: 0.04 10*3/uL (ref 0.00–0.07)
Basophils Absolute: 0 10*3/uL (ref 0.0–0.1)
Basophils Relative: 0 %
Eosinophils Absolute: 0.1 10*3/uL (ref 0.0–0.5)
Eosinophils Relative: 2 %
HCT: 26.8 % — ABNORMAL LOW (ref 36.0–46.0)
Hemoglobin: 8.4 g/dL — ABNORMAL LOW (ref 12.0–15.0)
Immature Granulocytes: 1 %
Lymphocytes Relative: 13 %
Lymphs Abs: 0.8 10*3/uL (ref 0.7–4.0)
MCH: 27.5 pg (ref 26.0–34.0)
MCHC: 31.3 g/dL (ref 30.0–36.0)
MCV: 87.9 fL (ref 80.0–100.0)
Monocytes Absolute: 0.3 10*3/uL (ref 0.1–1.0)
Monocytes Relative: 5 %
Neutro Abs: 4.9 10*3/uL (ref 1.7–7.7)
Neutrophils Relative %: 79 %
Platelets: 201 10*3/uL (ref 150–400)
RBC: 3.05 MIL/uL — ABNORMAL LOW (ref 3.87–5.11)
RDW: 16 % — ABNORMAL HIGH (ref 11.5–15.5)
WBC: 6.2 10*3/uL (ref 4.0–10.5)
nRBC: 0 % (ref 0.0–0.2)

## 2023-01-07 LAB — VITAMIN A: Vitamin A (Retinoic Acid): 2.5 ug/dL — ABNORMAL LOW (ref 22.0–69.5)

## 2023-01-07 MED ORDER — SODIUM BICARBONATE 650 MG PO TABS
1300.0000 mg | ORAL_TABLET | Freq: Two times a day (BID) | ORAL | Status: DC
  Administered 2023-01-07 – 2023-01-08 (×2): 1300 mg via ORAL
  Filled 2023-01-07 (×2): qty 2

## 2023-01-07 MED ORDER — ENOXAPARIN SODIUM 40 MG/0.4ML IJ SOSY
40.0000 mg | PREFILLED_SYRINGE | INTRAMUSCULAR | Status: DC
  Administered 2023-01-08 – 2023-01-19 (×12): 40 mg via SUBCUTANEOUS
  Filled 2023-01-07 (×12): qty 0.4

## 2023-01-07 NOTE — Progress Notes (Signed)
[facility] [place], [place] 01/07/23  Subjective:   Hospital day # 10  Patient resting in bed Husband at bedside States diarrhea started yesterday Subsided today.  Tolerating meals without nausea or vomiting  Renal: 04/28 0701 - 04/29 0700 In: -  Out: 2050 [Urine:2050] Lab Results  Component Value Date   CREATININE 1.57 (H) 01/07/2023   CREATININE 1.77 (H) 01/06/2023   CREATININE 2.07 (H) 01/05/2023     Objective:  Vital signs in last 24 hours:  Temp:  [97.7 F (36.5 C)-98.5 F (36.9 C)] 97.8 F (36.6 C) (04/29 1241) Pulse Rate:  [84-106] 95 (04/29 1241) Resp:  [18-20] 18 (04/29 1241) BP: (100-141)/(60-71) 141/69 (04/29 1241) SpO2:  [99 %-100 %] 99 % (04/29 1241) Weight:  [82 kg] 82 kg (04/29 0654)  Weight change: -2 kg Filed Weights   01/05/23 0346 01/06/23 0500 01/07/23 0654  Weight: 84.1 kg 84 kg 82 kg    Intake/Output:    Intake/Output Summary (Last 24 hours) at 01/07/2023 1459 Last data filed at 01/07/2023 1102 Gross per 24 hour  Intake --  Output 2250 ml  Net -2250 ml      Physical Exam: General: Laying in the bed, no acute distress  HEENT Moist oral mucous membranes  Pulm/lungs Coarse breath sounds  CVS/Heart No rub  Abdomen:  Soft, nontender  Extremities: Dependent edema present  Neurologic: Alert, oriented  Skin: Warm  Access: none       Basic Metabolic Panel:  Recent Labs  Lab 01/02/23 0406 01/03/23 0604 01/04/23 0320 01/05/23 0531 01/06/23 0535 01/07/23 0422  NA 137 137 136 137 138 140  K 4.2 4.2 4.3 4.0 3.8 3.5  CL 115* 116* 115* 115* 116* 114*  CO2 18* 16* 17* 16* 16* 18*  GLUCOSE 110* 154* 151* 92 93 102*  BUN 26* 28* 34* 38* 42* 42*  CREATININE 1.39* 1.58* 1.88* 2.07* 1.77* 1.57*  CALCIUM 7.6* 7.6* 7.7* 7.8* 7.6* 7.4*  MG 1.8  --   --   --   --   --   PHOS 2.9  --   --   --   --   --       CBC: Recent Labs  Lab 01/01/23 0432 01/02/23 0406 01/03/23 0604 01/05/23 0531 01/07/23 0422   WBC 14.3* 14.1* 10.9* 13.5* 6.2  NEUTROABS  --   --  10.1* 10.6* 4.9  HGB 9.4* 10.7* 9.7* 9.3* 8.4*  HCT 30.7* 34.9* 31.6* 30.3* 26.8*  MCV 90.6 89.7 88.5 89.6 87.9  PLT 367 397 361 347 201       Lab Results  Component Value Date   HEPBSAG Negative 02/21/2016   HEPBIGM Negative 02/21/2016      Microbiology:  Recent Results (from the past 240 hour(s))  C Difficile Quick Screen w PCR reflex     Status: Abnormal   Collection Time: 12/29/22  1:01 PM   Specimen: STOOL  Result Value Ref Range Status   C Diff antigen POSITIVE (A) NEGATIVE Final   C Diff toxin POSITIVE (A) NEGATIVE Final   C Diff interpretation Toxin producing C. difficile detected.  Final    Comment: CRITICAL RESULT CALLED TO, READ BACK BY AND VERIFIED WITH: KATIE CLAYTON AT 1432 12/29/22.PMF Performed at [facility], [address] [place], [place] [address]   Urine Culture     Status: Abnormal   Collection Time: 12/30/22 11:43 PM   Specimen: Urine, Random  Result Value Ref Range Status   Specimen Description  Final    URINE, RANDOM Performed at [facility], [address] [place], [place] [address]    Special Requests   Final    NONE Reflexed from [id number] Performed at [facility], [address] [place], [place] [address]    Culture (A)  Final    >=100,000 COLONIES/mL KLEBSIELLA OXYTOCA 70,000 COLONIES/mL ENTEROCOCCUS FAECALIS Confirmed Extended Spectrum Beta-Lactamase Producer (ESBL).  In bloodstream infections from ESBL organisms, carbapenems are preferred over piperacillin/tazobactam. They are shown to have a lower risk of mortality.    Report Status 01/02/2023 FINAL  Final   Organism ID, Bacteria KLEBSIELLA OXYTOCA (A)  Final   Organism ID, Bacteria ENTEROCOCCUS FAECALIS (A)  Final      Susceptibility   Enterococcus faecalis - MIC*    AMPICILLIN <=2 SENSITIVE Sensitive     NITROFURANTOIN <=16 SENSITIVE Sensitive     VANCOMYCIN 1 SENSITIVE Sensitive     *  70,000 COLONIES/mL ENTEROCOCCUS FAECALIS   Klebsiella oxytoca - MIC*    AMPICILLIN >=32 RESISTANT Resistant     CEFEPIME >=32 RESISTANT Resistant     CEFTRIAXONE >=64 RESISTANT Resistant     CIPROFLOXACIN 1 RESISTANT Resistant     GENTAMICIN >=16 RESISTANT Resistant     IMIPENEM <=0.25 SENSITIVE Sensitive     NITROFURANTOIN 32 SENSITIVE Sensitive     TRIMETH/SULFA >=320 RESISTANT Resistant     AMPICILLIN/SULBACTAM >=32 RESISTANT Resistant     PIP/TAZO <=4 SENSITIVE Sensitive     * >=100,000 COLONIES/mL KLEBSIELLA OXYTOCA  MRSA Next Gen by PCR, Nasal     Status: None   Collection Time: 12/31/22 12:46 AM   Specimen: Nasal Mucosa; Nasal Swab  Result Value Ref Range Status   MRSA by PCR Next Gen NOT DETECTED NOT DETECTED Final    Comment: (NOTE) The GeneXpert MRSA Assay (FDA approved for NASAL specimens only), is one component of a comprehensive MRSA colonization surveillance program. It is not intended to diagnose MRSA infection nor to guide or monitor treatment for MRSA infections. Test performance is not FDA approved in patients less than 21 years old. Performed at [facility], [address] [place], [place] [address]     Coagulation Studies: No results for input(s): "LABPROT", "INR" in the last 72 hours.  Urinalysis: No results for input(s): "COLORURINE", "LABSPEC", "PHURINE", "GLUCOSEU", "HGBUR", "BILIRUBINUR", "KETONESUR", "PROTEINUR", "UROBILINOGEN", "NITRITE", "LEUKOCYTESUR" in the last 72 hours.  Invalid input(s): "APPERANCEUR"     Imaging: No results found.   Medications:    sodium chloride      (feeding supplement) PROSource Plus  30 mL Oral TID BM   vitamin C  500 mg Oral BID   cholecalciferol  1,000 Units Oral Daily   [START ON 01/08/2023] enoxaparin (LOVENOX) injection  40 mg Subcutaneous Q24H   furosemide  40 mg Intravenous Daily   gabapentin  100 mg Oral QHS   Gerhardt's butt cream   Topical Daily   midodrine  5 mg Oral TID WC    multivitamin with minerals  1 tablet Oral Daily   nutrition supplement (JUVEN)  1 packet Oral BID BM   nystatin   Topical TID   vancomycin  125 mg Oral QID   Followed by   Melene Muller ON 01/12/2023] vancomycin  125 mg Oral BID   Followed by   Melene Muller ON 01/20/2023] vancomycin  125 mg Oral Daily   Followed by   Melene Muller ON 01/27/2023] vancomycin  125 mg Oral QODAY   Followed by   Melene Muller ON 02/04/2023]  vancomycin  125 mg Oral Q3 days   zinc sulfate  220 mg Oral Daily   ondansetron (ZOFRAN) IV, mouth rinse, oxyCODONE, polyethylene glycol, promethazine  Assessment/ Plan:  72 y.o. female with hypertension, cervical cancer, rheumatoid arthritis, history of C. difficile, CKD   admitted on 12/27/2022 for Shock circulatory (HCC) [R57.9] Sepsis with hypotension (HCC) [A41.9, I95.9] Abdominal pain, unspecified abdominal location [R10.9] Diarrhea, unspecified type [R19.7]  Acute kidney injury on chronic kidney disease stage IIIb.  Baseline creatinine of 1.35/GFR 43 from 11/11/2022. AKI likely secondary to IV contrast nephropathy, hypotension, concurrent illness and nonsteroidal use. Avoid further IV contrast exposure.  Avoid nonsteroidals. Renal imaging-12/28/2022-CT abdomen pelvis with contrast. -severe left renal atrophy, progressive.  Moderate left hydroureteronephrosis, chronic.  No convincing ureteral calculus. Right renal cortical scaring noted.  Nonobstructing renal calculi measuring 2 to 3 mm.  No hydronephrosis noted on right. Left hydroureteronephrosis appears to be chronic.  Unlikely causing acute change in creatinine.  Creatinine remains stable despite diarrhea. Continue furosemide and midodrine.   Generalized edema The third spacing of fluid Low albumin of 2.1. Albumin given   LOS: 10 Jeanne [name] 4/29/20242:59 PM  [facility] [address] [place], [place] [phone]

## 2023-01-07 NOTE — Progress Notes (Signed)
Progress Note    Jeanne [name]  WUJ:[id number] DOB: May 10, 1951  DOA: 12/27/2022 PCP: Jerl Mina, MD      Brief Narrative:    Medical records reviewed and are as summarized below:  Jeanne [name] is a 72 y.o. female with medical history significant for cervical cancer, rheumatoid arthritis, hypertension, nephrolithiasis, left UPJ urolithiasis, CKD stage IIIb. Of note the patient was recently hospitalized with C.Diff from 11/09/22-11/12/22, discharged with PO vancomycin.  She had previously been taking Bactrim at that time.  She states she completed this antibiotic course as prescribed. She reported that she did have resolution of her symptoms for a few weeks. She also had COVID 19 + during this admission, but was asymptomatic and did not require treatment.   She presented to the hospital because of diarrhea.      Assessment/Plan:   Principal Problem:   Pancolitis (HCC) Active Problems:   Clostridium difficile diarrhea   COVID-19 virus infection   AKI (acute kidney injury) (HCC)   History of Clostridioides difficile colitis   Hypotension   CKD stage 3b, GFR 30-44 ml/min (HCC)   Nutrition Problem: Inadequate oral intake Etiology: acute illness  Signs/Symptoms: per patient/family report   Body mass index is 31.03 kg/m.  (Obesity)    Recurrent C. difficile colitis, pancolitis on CT abdomen, recent C. difficile infection on 11/09/2022: Slowly improving.  Continue oral vancomycin taper.   Persistent hypotension: Improved.  Continue midodrine.  S/p 2 days of IV hydrocortisone from 01/01/2022.    AKI on CKD stage IIIb, normal anion gap metabolic acidosis: Creatinine is improving.  She is still on IV Lasix.  Monitor BMP.  Follow-up with nephrologist. Renal ultrasound showed bilateral renal cortical thinning with medical renal disease changes, mild bilateral hydronephrosis with, small amount of ascites and small pleural effusions.  Continue oral sodium bicarbonate  for metabolic acidosis. Diclofenac was discontinued on 01/02/2023.   Rheumatoid arthritis: Oxycodone as needed for pain.  Diclofenac was discontinued on 01/02/2023    Peripheral edema/anasarca: She said she's had lower extremity swelling since surgery in October 2023.  However, it appears lower extremity swelling has worsened.  Suspect some of the swelling may be due to fluid overload.  S/p IV Lasix 20 mg x 1 dose on 01/03/2023. She is on IV Lasix. Venous duplex did not show any evidence of acute DVT.  Age-indeterminate nonocclusive wall thickening/SVT involving the distal aspect of the greater saphenous vein.  2D echo was unremarkable.  EF estimated at 60 to 65%.   Right total hip arthroplasty on 07/02/2022: Patient says she has not been able to walk since her surgery.  She gets physical therapy at home. CT right hip did not show any acute fracture.  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 Attempt to transfer patient to [facility] proved futile.  I called the [facility] transfer line on 01/03/2023.  I was told that [facility] is at capacity and cannot accept transfer  to the medical team, and also there was no medical reason for patient to be transferred to [facility]..  Since there is no acute orthopedic issue, patient cannot be transferred to the orthopedic service.  Outpatient follow-up with Dr. Leontine Locket after discharge from the hospital.   History of COVID-19 infection: Positive COVID test on 11/09/2022 and repeat COVID test positive on 12/28/2022.    History of  nephrolithiasis and left urolithiasis.       Diet Order             Diet regular Fluid consistency: Thin  Diet effective now                            Consultants: Intensivist Infectious disease  specialist Nephrologist  Procedures: None    Medications:    (feeding supplement) PROSource Plus  30 mL Oral TID BM   vitamin C  500 mg Oral BID   cholecalciferol  1,000 Units Oral Daily   [START ON 01/08/2023] enoxaparin (LOVENOX) injection  40 mg Subcutaneous Q24H   furosemide  40 mg Intravenous Daily   gabapentin  100 mg Oral QHS   Gerhardt's butt cream   Topical Daily   midodrine  5 mg Oral TID WC   multivitamin with minerals  1 tablet Oral Daily   nutrition supplement (JUVEN)  1 packet Oral BID BM   nystatin   Topical TID   vancomycin  125 mg Oral QID   Followed by   Melene Muller ON 01/12/2023] vancomycin  125 mg Oral BID   Followed by   Melene Muller ON 01/20/2023] vancomycin  125 mg Oral Daily   Followed by   Melene Muller ON 01/27/2023] vancomycin  125 mg Oral QODAY   Followed by   Melene Muller ON 02/04/2023] vancomycin  125 mg Oral Q3 days   zinc sulfate  220 mg Oral Daily   Continuous Infusions:  sodium chloride       Anti-infectives (From admission, onward)    Start     Dose/Rate Route Frequency Ordered Stop   02/04/23 1000  vancomycin (VANCOCIN) capsule 125 mg       See Hyperspace for full Linked Orders Report.   125 mg Oral Every 3 DAYS 12/31/22 1243 02/13/23 0959   01/27/23 1000  vancomycin (VANCOCIN) capsule 125 mg       See Hyperspace for full Linked Orders Report.   125 mg Oral Every other day 12/31/22 1243 02/04/23 0959   01/20/23 1000  vancomycin (VANCOCIN) capsule 125 mg       See Hyperspace for full Linked Orders Report.   125 mg Oral Daily 12/31/22 1243 01/27/23 0959   01/12/23 2200  vancomycin (VANCOCIN) capsule 125 mg       See Hyperspace for full Linked Orders Report.   125 mg Oral 2 times daily 12/31/22 1243 01/19/23 2159   12/31/22 1800  vancomycin (VANCOCIN) capsule 125 mg       See Hyperspace for full Linked Orders Report.   125 mg Oral 4 times daily 12/31/22 1243 01/12/23 1759   12/28/22 1415  vancomycin (VANCOCIN) 50 mg/mL oral solution SOLN 125 mg  Status:   Discontinued        125 mg Oral Every 6 hours 12/28/22 1323 12/31/22 1243   12/28/22 1000  metroNIDAZOLE (FLAGYL) IVPB 500 mg  Status:  Discontinued        500 mg 100 mL/hr over 60 Minutes Intravenous Every 12 hours 12/28/22 0410 12/28/22 1426   12/27/22 2345  metroNIDAZOLE (FLAGYL) IVPB 500 mg        500 mg 100 mL/hr over 60 Minutes Intravenous  Once 12/27/22 2344 12/28/22 0246              Family Communication/Anticipated D/C date and plan/Code Status   DVT prophylaxis: enoxaparin (LOVENOX) injection 40 mg Start: 01/08/23 0800 SCDs Start: 12/28/22 0259  Code Status: Full Code  Family Communication: Plan discussed with the husband at the bedside Disposition Plan: Plans to discharge home in 2 to 3 days   Status is: Inpatient Remains inpatient appropriate because: Recurrent C. difficile colitis, AKI     Subjective:   Interval events noted.  She had multiple mushy stools yesterday and a rectal tube was placed for diarrhea.  She feels better today.  She is also producing more urine.  Rectal tube bag was emptied this morning.  Her husband was at the bedside.  Objective:    Vitals:   01/06/23 2320 01/07/23 0654 01/07/23 0657 01/07/23 1241  BP: 124/60  117/65 (!) 141/69  Pulse: 84  87 95  Resp: 18  18 18   Temp: 98.1 F (36.7 C)  98.1 F (36.7 C) 97.8 F (36.6 C)  TempSrc:    Oral  SpO2: 100%  100% 99%  Weight:  82 kg    Height:       No data found.   Intake/Output Summary (Last 24 hours) at 01/07/2023 1431 Last data filed at 01/07/2023 1102 Gross per 24 hour  Intake --  Output 2250 ml  Net -2250 ml   Filed Weights   01/05/23 0346 01/06/23 0500 01/07/23 0654  Weight: 84.1 kg 84 kg 82 kg    Exam:  GEN: NAD SKIN: No rash EYES: EOMI ENT: MMM CV: RRR PULM: CTA B ABD: soft, obese, NT, +BS CNS: AAO x 3, non focal EXT: Bilateral lower extremity pitting edema.  No erythema or tenderness       Data Reviewed:   I have personally reviewed  following labs and imaging studies:  Labs: Labs show the following:   Basic Metabolic Panel: Recent Labs  Lab 01/02/23 0406 01/03/23 0604 01/04/23 0320 01/05/23 0531 01/06/23 0535 01/07/23 0422  NA 137 137 136 137 138 140  K 4.2 4.2 4.3 4.0 3.8 3.5  CL 115* 116* 115* 115* 116* 114*  CO2 18* 16* 17* 16* 16* 18*  GLUCOSE 110* 154* 151* 92 93 102*  BUN 26* 28* 34* 38* 42* 42*  CREATININE 1.39* 1.58* 1.88* 2.07* 1.77* 1.57*  CALCIUM 7.6* 7.6* 7.7* 7.8* 7.6* 7.4*  MG 1.8  --   --   --   --   --   PHOS 2.9  --   --   --   --   --    GFR Estimated Creatinine Clearance: 34.5 mL/min (A) (by C-G formula based on SCr of 1.57 mg/dL (H)). Liver Function Tests: Recent Labs  Lab 01/04/23 0320  AST 17  ALT 9  ALKPHOS 86  BILITOT 0.5  PROT 4.9*  ALBUMIN 2.1*   No results for input(s): "LIPASE", "AMYLASE" in the last 168 hours. No results for input(s): "AMMONIA" in the last 168 hours. Coagulation profile No results for input(s): "INR", "PROTIME" in the last 168 hours.  CBC: Recent Labs  Lab 01/01/23 0432 01/02/23 0406 01/03/23 0604 01/05/23 0531 01/07/23 0422  WBC 14.3* 14.1* 10.9* 13.5* 6.2  NEUTROABS  --   --  10.1* 10.6* 4.9  HGB 9.4* 10.7* 9.7* 9.3* 8.4*  HCT 30.7* 34.9* 31.6* 30.3* 26.8*  MCV 90.6 89.7 88.5 89.6 87.9  PLT 367 397 361 347 201   Cardiac Enzymes: No results for input(s): "CKTOTAL", "CKMB", "CKMBINDEX", "TROPONINI" in the last 168 hours. BNP (last 3 results) No results for input(s): "PROBNP" in the last 8760 hours. CBG: Recent Labs  Lab 01/02/23 1819 01/03/23 0853 01/03/23 1153 01/03/23 1615 01/04/23  2123  GLUCAP 113* 132* 131* 142* 111*   D-Dimer: No results for input(s): "DDIMER" in the last 72 hours. Hgb A1c: No results for input(s): "HGBA1C" in the last 72 hours. Lipid Profile: No results for input(s): "CHOL", "HDL", "LDLCALC", "TRIG", "CHOLHDL", "LDLDIRECT" in the last 72 hours. Thyroid function studies: No results for input(s):  "TSH", "T4TOTAL", "T3FREE", "THYROIDAB" in the last 72 hours.  Invalid input(s): "FREET3" Anemia work up: No results for input(s): "VITAMINB12", "FOLATE", "FERRITIN", "TIBC", "IRON", "RETICCTPCT" in the last 72 hours. Sepsis Labs: Recent Labs  Lab 01/02/23 0406 01/02/23 1417 01/02/23 1658 01/03/23 0604 01/05/23 0531 01/07/23 0422  WBC 14.1*  --   --  10.9* 13.5* 6.2  LATICACIDVEN  --  1.4 1.3  --   --   --     Microbiology Recent Results (from the past 240 hour(s))  C Difficile Quick Screen w PCR reflex     Status: Abnormal   Collection Time: 12/29/22  1:01 PM   Specimen: STOOL  Result Value Ref Range Status   C Diff antigen POSITIVE (A) NEGATIVE Final   C Diff toxin POSITIVE (A) NEGATIVE Final   C Diff interpretation Toxin producing C. difficile detected.  Final    Comment: CRITICAL RESULT CALLED TO, READ BACK BY AND VERIFIED WITH: KATIE CLAYTON AT 1432 12/29/22.PMF Performed at [facility], [address] [place], [place] [address]   Urine Culture     Status: Abnormal   Collection Time: 12/30/22 11:43 PM   Specimen: Urine, Random  Result Value Ref Range Status   Specimen Description   Final    URINE, RANDOM Performed at [facility], [address] [place], [place] [address]    Special Requests   Final    NONE Reflexed from [phone] Performed at [facility], [address] [place], [place] [address]    Culture (A)  Final    >=100,000 COLONIES/mL KLEBSIELLA OXYTOCA 70,000 COLONIES/mL ENTEROCOCCUS FAECALIS Confirmed Extended Spectrum Beta-Lactamase Producer (ESBL).  In bloodstream infections from ESBL organisms, carbapenems are preferred over piperacillin/tazobactam. They are shown to have a lower risk of mortality.    Report Status 01/02/2023 FINAL  Final   Organism ID, Bacteria KLEBSIELLA OXYTOCA (A)  Final   Organism ID, Bacteria ENTEROCOCCUS FAECALIS (A)  Final      Susceptibility   Enterococcus faecalis - MIC*    AMPICILLIN <=2  SENSITIVE Sensitive     NITROFURANTOIN <=16 SENSITIVE Sensitive     VANCOMYCIN 1 SENSITIVE Sensitive     * 70,000 COLONIES/mL ENTEROCOCCUS FAECALIS   Klebsiella oxytoca - MIC*    AMPICILLIN >=32 RESISTANT Resistant     CEFEPIME >=32 RESISTANT Resistant     CEFTRIAXONE >=64 RESISTANT Resistant     CIPROFLOXACIN 1 RESISTANT Resistant     GENTAMICIN >=16 RESISTANT Resistant     IMIPENEM <=0.25 SENSITIVE Sensitive     NITROFURANTOIN 32 SENSITIVE Sensitive     TRIMETH/SULFA >=320 RESISTANT Resistant     AMPICILLIN/SULBACTAM >=32 RESISTANT Resistant     PIP/TAZO <=4 SENSITIVE Sensitive     * >=100,000 COLONIES/mL KLEBSIELLA OXYTOCA  MRSA Next Gen by PCR, Nasal     Status: None   Collection Time: 12/31/22 12:46 AM   Specimen: Nasal Mucosa; Nasal Swab  Result Value Ref Range Status   MRSA by PCR Next Gen NOT DETECTED NOT DETECTED Final    Comment: (NOTE) The GeneXpert MRSA Assay (FDA approved for NASAL specimens only), is one component of a comprehensive MRSA colonization  surveillance program. It is not intended to diagnose MRSA infection nor to guide or monitor treatment for MRSA infections. Test performance is not FDA approved in patients less than 73 years old. Performed at [facility], [address] [place], [place] [address]     Procedures and diagnostic studies:  No results found.             LOS: 10 days   Iceis Knab  [facility]   Pager on [email or web address]. If 7PM-7AM, please contact night-coverage at [email or web address]     01/07/2023, 2:31 PM

## 2023-01-07 NOTE — Progress Notes (Signed)
Occupational Therapy Treatment Patient Details Name: Jeanne [name] MRN: [id number] DOB: 12-Feb-1951 Today's Date: 01/07/2023   History of present illness 72 y/o female recently hospitalized with C.Diff from 11/09/22-11/12/22, discharged with PO vancomycin. She states she completed this antibiotic course as prescribed. She reported that she did have resolution of her symptoms for a few weeks. She also had been COVID 19 + during this admission, but was asymptomatic and did not require treatment.  She reports that over the past week she has been nauseous and had poor PO intake, but denied vomiting. Over the last 4 days she has had multiple bouts of non-bloody diarrhea and bilateral lower quadrant abdominal pain/cramping. She reported these episodes seemed exactly the same in consistency and smell as during her previous C.Diff infection. She also reports generalized fatigue.   OT comments  Ms. Hunsberger was seen for OT tx session this date. Pt received with PT in room finishing up session. Pt adamantly declines OOB/EOB activity 2/2 increased pain and fear of her rectal tube "falling out" if she were to sit EOB. Pt adamantly declines despite education, agreeable to bed level UE exercises and agreeable to having bed in chair position t/o duration of session. OT facilitated UE exercises as described below with education on safety, joint protection, and energy conservation t/o. Pt is making progress toward OT goals and continues to benefit from skilled OT services.    Recommendations for follow up therapy are one component of a multi-disciplinary discharge planning process, led by the attending physician.  Recommendations may be updated based on patient status, additional functional criteria and insurance authorization.    Assistance Recommended at Discharge Frequent or constant Supervision/Assistance  Patient can return home with the following  A lot of help with bathing/dressing/bathroom;Assistance with  cooking/housework;Assist for transportation;Help with stairs or ramp for entrance;Two people to help with walking and/or transfers   Equipment Recommendations  None recommended by OT    Recommendations for Other Services      Precautions / Restrictions Precautions Precautions: Fall Restrictions Weight Bearing Restrictions: Yes RLE Weight Bearing: Partial weight bearing RLE Partial Weight Bearing Percentage or Pounds: 20% per pt report Other Position/Activity Restrictions: Pt reports PWB s/p THA in October.       Mobility Bed Mobility Overal bed mobility: Needs Assistance             General bed mobility comments: Pt declines all EOB/OOB activity despite education and re-assurance. TOTAL A +2 to boost to [facility]    Transfers                         Balance Overall balance assessment: Needs assistance                                         ADL either performed or assessed with clinical judgement   ADL Overall ADL's : Needs assistance/impaired                                       General ADL Comments: Pt continues to be functionally limited by generalized weakness, decreased activity tolerance, and decreased LB access. She declines all ADL management during session. Continue to anticipate heavy assist for LB ADL management from STS.    Extremity/Trunk Assessment Upper Extremity Assessment Upper Extremity  Assessment: Generalized weakness   Lower Extremity Assessment Lower Extremity Assessment: Generalized weakness        Vision Patient Visual Report: No change from baseline     Perception     Praxis      Cognition Arousal/Alertness: Awake/alert Behavior During Therapy: Anxious Overall Cognitive Status: No family/caregiver present to determine baseline cognitive functioning                                 General Comments: Anxious re: rectal tube and arthritis pain. Very self limiting during  session.        Exercises Other Exercises Other Exercises: Pt educated on importance of functional activity during hospital stay, joint protection, and bed mobility techniques. She is agreeable to minimal bed level exercises only during session completes 2 sets of 10 reps each of: (forward punches, ceiling punches, and cross body reaches)    Shoulder Instructions       General Comments      Pertinent Vitals/ Pain       Pain Assessment Pain Assessment: Faces Faces Pain Scale: Hurts little more Pain Location: arthritis Pain Descriptors / Indicators: Discomfort, Sore Pain Intervention(s): Limited activity within patient's tolerance, Monitored during session, Repositioned, Utilized relaxation techniques  Home Living                                          Prior Functioning/Environment              Frequency  Min 1X/week        Progress Toward Goals  OT Goals(current goals can now be found in the care plan section)  Progress towards OT goals: Progressing toward goals  Acute Rehab OT Goals Patient Stated Goal: to go home OT Goal Formulation: With patient Time For Goal Achievement: 01/16/23 Potential to Achieve Goals: Fair  Plan Discharge plan remains appropriate;Frequency remains appropriate    Co-evaluation                 AM-PAC OT "6 Clicks" Daily Activity     Outcome Measure   Help from another person eating meals?: A Little Help from another person taking care of personal grooming?: A Little Help from another person toileting, which includes using toliet, bedpan, or urinal?: Total Help from another person bathing (including washing, rinsing, drying)?: A Lot Help from another person to put on and taking off regular upper body clothing?: A Little Help from another person to put on and taking off regular lower body clothing?: Total 6 Click Score: 13    End of Session    OT Visit Diagnosis: Muscle weakness (generalized)  (M62.81)   Activity Tolerance Patient limited by fatigue;Patient limited by pain;Other (comment) (self limiting, fearful of rectal tube coming out with any mobility attempts.)   Patient Left in bed;with call bell/phone within reach;with family/visitor present;with bed alarm set   Nurse Communication Mobility status        Time: 8295-6213 OT Time Calculation (min): 15 min  Charges: OT General Charges $OT Visit: 1 Visit OT Treatments $Therapeutic Exercise: 8-22 mins  Rockney Ghee, M.S., OTR/L 01/07/23, 2:57 PM

## 2023-01-07 NOTE — Progress Notes (Signed)
PHARMACIST - PHYSICIAN COMMUNICATION  CONCERNING:  Enoxaparin (Lovenox) for DVT Prophylaxis    RECOMMENDATION: Patient was prescribed enoxaparin 30mg  q24 hours for VTE prophylaxis.   Filed Weights   01/05/23 0346 01/06/23 0500 01/07/23 0654  Weight: 84.1 kg (185 lb 6.5 oz) 84 kg (185 lb 3 oz) 82 kg (180 lb 12.4 oz)    Body mass index is 31.03 kg/m.  Estimated Creatinine Clearance: 34.5 mL/min (A) (by C-G formula based on SCr of 1.57 mg/dL (H)).  Patient is candidate for enoxaparin 40mg  every 24 hours based on CrCl >50ml/min and Weight >45kg  DESCRIPTION: Pharmacy has adjusted enoxaparin dose per [facility] policy.  Patient is now receiving enoxaparin 40 mg every 24 hours   Tressie Ellis 01/07/2023 10:20 AM

## 2023-01-07 NOTE — Progress Notes (Signed)
Physical Therapy Treatment Patient Details Name: Jeanne [name] MRN: [id number] DOB: Jul 15, 1951 Today's Date: 01/07/2023   History of Present Illness 72 y/o female recently hospitalized with C.Diff from 11/09/22-11/12/22, discharged with PO vancomycin. She states she completed this antibiotic course as prescribed. She reported that she did have resolution of her symptoms for a few weeks. She also had been COVID 19 + during this admission, but was asymptomatic and did not require treatment.  She reports that over the past week she has been nauseous and had poor PO intake, but denied vomiting. Over the last 4 days she has had multiple bouts of non-bloody diarrhea and bilateral lower quadrant abdominal pain/cramping. She reported these episodes seemed exactly the same in consistency and smell as during her previous C.Diff infection. She also reports generalized fatigue.    PT Comments    Pt received upright in bed agreeable to limited PT participation due to rectal tube and general pain from RA. Pt placed in chair position for LE therex mentioned below. Pt remains needing AAROM for proximal musculature due to deconditioning. OT entering room at end of session with further declining attempts to sit EOB with two person assist. MaxA+2 to scoot  up in bed with pt left in care of OT. Noticed skin breakdown on lateral aspect of R knee. PT informing RN. D/c recs remain appropriate.    Recommendations for follow up therapy are one component of a multi-disciplinary discharge planning process, led by the attending physician.  Recommendations may be updated based on patient status, additional functional criteria and insurance authorization.  Follow Up Recommendations  Can patient physically be transported by private vehicle: No    Assistance Recommended at Discharge Frequent or constant Supervision/Assistance  Patient can return home with the following Two people to help with walking and/or transfers;A lot of  help with bathing/dressing/bathroom;Assistance with cooking/housework;Assist for transportation;Help with stairs or ramp for entrance   Equipment Recommendations  None recommended by PT    Recommendations for Other Services       Precautions / Restrictions Precautions Precautions: Fall Restrictions Weight Bearing Restrictions: Yes RLE Weight Bearing: Partial weight bearing RLE Partial Weight Bearing Percentage or Pounds: 20% per pt report     Mobility  Bed Mobility               General bed mobility comments: maxA+2 to scoot up towards HOB. Patient Response: Cooperative  Transfers                        Ambulation/Gait                   Stairs             Wheelchair Mobility    Modified Rankin (Stroke Patients Only)       Balance                                            Cognition Arousal/Alertness: Awake/alert Behavior During Therapy: Anxious Overall Cognitive Status: Within Functional Limits for tasks assessed                                 General Comments: limited particiaption due to rectal tube        Exercises General Exercises - Lower Extremity Ankle Circles/Pumps:  AROM, Right, Left, Strengthening, Both, 10 reps (chair position) Short Arc Quad: AROM, Strengthening, Both, 10 reps (chair position) Hip ABduction/ADduction: AAROM, 10 reps, Both, Supine Straight Leg Raises: AAROM, Strengthening, Both, 10 reps, Supine    General Comments        Pertinent Vitals/Pain Pain Assessment Pain Assessment: Faces Faces Pain Scale: Hurts little more Pain Location: arthritis Pain Descriptors / Indicators: Discomfort, Sore Pain Intervention(s): Repositioned    Home Living                          Prior Function            PT Goals (current goals can now be found in the care plan section) Acute Rehab PT Goals Patient Stated Goal: Feel better and go home PT Goal Formulation:  With patient Time For Goal Achievement: 01/15/23 Potential to Achieve Goals: Good Progress towards PT goals: Progressing toward goals    Frequency    Min 3X/week      PT Plan Current plan remains appropriate    Co-evaluation              AM-PAC PT "6 Clicks" Mobility   Outcome Measure  Help needed turning from your back to your side while in a flat bed without using bedrails?: A Lot Help needed moving from lying on your back to sitting on the side of a flat bed without using bedrails?: Total Help needed moving to and from a bed to a chair (including a wheelchair)?: Total Help needed standing up from a chair using your arms (e.g., wheelchair or bedside chair)?: Total   Help needed climbing 3-5 steps with a railing? : Total 6 Click Score: 6    End of Session   Activity Tolerance: Patient limited by pain Patient left: in bed;with call bell/phone within reach;with bed alarm set Nurse Communication: Mobility status PT Visit Diagnosis: Difficulty in walking, not elsewhere classified (R26.2);Muscle weakness (generalized) (M62.81);Pain Pain - Right/Left: Right Pain - part of body: Hip     Time: 6578-4696 PT Time Calculation (min) (ACUTE ONLY): 23 min  Charges:  $Therapeutic Exercise: 23-37 mins                     Shanta Hartner M. Fairly IV, PT, DPT Physical Therapist- [place]  [facility]  01/07/2023, 2:36 PM

## 2023-01-08 DIAGNOSIS — A0472 Enterocolitis due to Clostridium difficile, not specified as recurrent: Secondary | ICD-10-CM | POA: Diagnosis not present

## 2023-01-08 DIAGNOSIS — E876 Hypokalemia: Secondary | ICD-10-CM | POA: Diagnosis not present

## 2023-01-08 DIAGNOSIS — N179 Acute kidney failure, unspecified: Secondary | ICD-10-CM | POA: Diagnosis not present

## 2023-01-08 DIAGNOSIS — K5100 Ulcerative (chronic) pancolitis without complications: Secondary | ICD-10-CM | POA: Diagnosis not present

## 2023-01-08 LAB — CBC WITH DIFFERENTIAL/PLATELET
Abs Immature Granulocytes: 0.08 10*3/uL — ABNORMAL HIGH (ref 0.00–0.07)
Basophils Absolute: 0 10*3/uL (ref 0.0–0.1)
Basophils Relative: 0 %
Eosinophils Absolute: 0.2 10*3/uL (ref 0.0–0.5)
Eosinophils Relative: 2 %
HCT: 26.2 % — ABNORMAL LOW (ref 36.0–46.0)
Hemoglobin: 8.3 g/dL — ABNORMAL LOW (ref 12.0–15.0)
Immature Granulocytes: 1 %
Lymphocytes Relative: 14 %
Lymphs Abs: 1.2 10*3/uL (ref 0.7–4.0)
MCH: 27.8 pg (ref 26.0–34.0)
MCHC: 31.7 g/dL (ref 30.0–36.0)
MCV: 87.6 fL (ref 80.0–100.0)
Monocytes Absolute: 0.5 10*3/uL (ref 0.1–1.0)
Monocytes Relative: 6 %
Neutro Abs: 6.8 10*3/uL (ref 1.7–7.7)
Neutrophils Relative %: 77 %
Platelets: 263 10*3/uL (ref 150–400)
RBC: 2.99 MIL/uL — ABNORMAL LOW (ref 3.87–5.11)
RDW: 15.8 % — ABNORMAL HIGH (ref 11.5–15.5)
WBC: 8.8 10*3/uL (ref 4.0–10.5)
nRBC: 0 % (ref 0.0–0.2)

## 2023-01-08 LAB — RENAL FUNCTION PANEL
Albumin: 2.2 g/dL — ABNORMAL LOW (ref 3.5–5.0)
Anion gap: 7 (ref 5–15)
BUN: 41 mg/dL — ABNORMAL HIGH (ref 8–23)
CO2: 25 mmol/L (ref 22–32)
Calcium: 7.7 mg/dL — ABNORMAL LOW (ref 8.9–10.3)
Chloride: 106 mmol/L (ref 98–111)
Creatinine, Ser: 1.29 mg/dL — ABNORMAL HIGH (ref 0.44–1.00)
GFR, Estimated: 45 mL/min — ABNORMAL LOW (ref >60)
Glucose, Bld: 108 mg/dL — ABNORMAL HIGH (ref 70–99)
Phosphorus: 2.9 mg/dL (ref 2.5–4.6)
Potassium: 2.9 mmol/L — ABNORMAL LOW (ref 3.5–5.1)
Sodium: 138 mmol/L (ref 135–145)

## 2023-01-08 LAB — MAGNESIUM: Magnesium: 1.6 mg/dL — ABNORMAL LOW (ref 1.7–2.4)

## 2023-01-08 LAB — GLUCOSE, CAPILLARY: Glucose-Capillary: 102 mg/dL — ABNORMAL HIGH (ref 70–99)

## 2023-01-08 MED ORDER — POTASSIUM CHLORIDE CRYS ER 20 MEQ PO TBCR
40.0000 meq | EXTENDED_RELEASE_TABLET | Freq: Two times a day (BID) | ORAL | Status: AC
  Administered 2023-01-08 (×2): 40 meq via ORAL
  Filled 2023-01-08 (×2): qty 2

## 2023-01-08 MED ORDER — MAGNESIUM SULFATE 2 GM/50ML IV SOLN
2.0000 g | Freq: Once | INTRAVENOUS | Status: AC
  Administered 2023-01-08: 2 g via INTRAVENOUS
  Filled 2023-01-08: qty 50

## 2023-01-08 MED ORDER — VITAMIN A 3 MG (10000 UNIT) PO CAPS
10000.0000 [IU] | ORAL_CAPSULE | Freq: Every day | ORAL | Status: DC
  Administered 2023-01-09 – 2023-01-19 (×10): 10000 [IU] via ORAL
  Filled 2023-01-08 (×12): qty 1

## 2023-01-08 NOTE — Progress Notes (Addendum)
Progress Note    Jeanne [name]  ZOX:[id number] DOB: July 30, 1951  DOA: 12/27/2022 PCP: Jerl Mina, MD      Brief Narrative:    Medical records reviewed and are as summarized below:  Jeanne [name] is a 72 y.o. female with medical history significant for cervical cancer, rheumatoid arthritis, hypertension, nephrolithiasis, left UPJ urolithiasis, CKD stage IIIb. Of note the patient was recently hospitalized with C.Diff from 11/09/22-11/12/22, discharged with PO vancomycin.  She had previously been taking Bactrim at that time.  She states she completed this antibiotic course as prescribed. She reported that she did have resolution of her symptoms for a few weeks. She also had COVID 19 + during this admission, but was asymptomatic and did not require treatment.   She presented to the hospital because of diarrhea.  She was admitted to the hospital for recurrent C. difficile colitis complicated by persistent hypotension and acute kidney injury.      Assessment/Plan:   Principal Problem:   Pancolitis (HCC) Active Problems:   Clostridium difficile diarrhea   COVID-19 virus infection   AKI (acute kidney injury) (HCC)   History of Clostridioides difficile colitis   Hypotension   CKD stage 3b, GFR 30-44 ml/min (HCC)   Hypomagnesemia   Hypokalemia   Nutrition Problem: Inadequate oral intake Etiology: acute illness  Signs/Symptoms: per patient/family report   Body mass index is 29.21 kg/m.  (Obesity)    Recurrent C. difficile colitis, pancolitis on CT abdomen, recent C. difficile infection on 11/09/2022: Slowly improving.  Continue p.o. vancomycin taper Rectal tube can be discontinued if diarrhea continues to improve.   Persistent hypotension: Improved.  Continue midodrine.  S/p 2 days of IV hydrocortisone from 01/01/2022.    Hypokalemia and hypomagnesemia: Replete potassium with oral potassium chloride and magnesium with IV magnesium sulfate.  Monitor  electrolytes.   AKI on CKD stage IIIb, normal anion gap metabolic acidosis: Creatinine continues to trend down.  She is still on IV Lasix.  Metabolic acidosis has improved so oral sodium bicarbonate will be discontinued.  Monitor BMP.  Follow-up with nephrologist. Renal ultrasound showed bilateral renal cortical thinning with medical renal disease changes, mild bilateral hydronephrosis with, small amount of ascites and small pleural effusions.  Diclofenac was discontinued on 01/02/2023.   Vitamin A deficiency, vitamin D deficiency: Vitamin A level was 2.5. Start Vitamin A capsules 10,000 units daily.  Vitamin D was 26.5.  Continue vitamin D supplement.   Rheumatoid arthritis: Oxycodone as needed for pain.  Diclofenac was discontinued on 01/02/2023    Peripheral edema/anasarca: She said she's had lower extremity swelling since surgery in October 2023.  However, it appears lower extremity swelling has worsened.  Suspect some of the swelling may be due to fluid overload.  S/p IV Lasix 20 mg x 1 dose on 01/03/2023. She is on IV Lasix. Venous duplex did not show any evidence of acute DVT.  Age-indeterminate nonocclusive wall thickening/SVT involving the distal aspect of the greater saphenous vein.  2D echo was unremarkable.  EF estimated at 60 to 65%.   Right total hip arthroplasty on 07/02/2022: Patient says she has not been able to walk since her surgery.  She gets physical therapy at home. CT right hip did not show any acute fracture.  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 Attempt to transfer patient to [facility] proved  futile.  I called the [facility] transfer line on 01/03/2023.  I was told that [facility] is at capacity and cannot accept transfer  to the medical team, and also there was no medical reason for patient to be transferred  to [facility]..  Since there is no acute orthopedic issue, patient cannot be transferred to the orthopedic service.  Outpatient follow-up with Dr. Leontine Locket after discharge from the hospital.   History of COVID-19 infection: Positive COVID test on 11/09/2022 and repeat COVID test positive on 12/28/2022.    History of nephrolithiasis and left urolithiasis.       Diet Order             Diet regular Fluid consistency: Thin  Diet effective now                            Consultants: Intensivist Infectious disease specialist Nephrologist  Procedures: None    Medications:    (feeding supplement) PROSource Plus  30 mL Oral TID BM   vitamin C  500 mg Oral BID   cholecalciferol  1,000 Units Oral Daily   enoxaparin (LOVENOX) injection  40 mg Subcutaneous Q24H   furosemide  40 mg Intravenous Daily   gabapentin  100 mg Oral QHS   Gerhardt's butt cream   Topical Daily   midodrine  5 mg Oral TID WC   multivitamin with minerals  1 tablet Oral Daily   nutrition supplement (JUVEN)  1 packet Oral BID BM   nystatin   Topical TID   potassium chloride  40 mEq Oral BID   vancomycin  125 mg Oral QID   Followed by   Melene Muller ON 01/12/2023] vancomycin  125 mg Oral BID   Followed by   Melene Muller ON 01/20/2023] vancomycin  125 mg Oral Daily   Followed by   Melene Muller ON 01/27/2023] vancomycin  125 mg Oral QODAY   Followed by   Melene Muller ON 02/04/2023] vancomycin  125 mg Oral Q3 days   zinc sulfate  220 mg Oral Daily   Continuous Infusions:  sodium chloride     magnesium sulfate bolus IVPB 2 g (01/08/23 1345)     Anti-infectives (From admission, onward)    Start     Dose/Rate Route Frequency Ordered Stop   02/04/23 1000  vancomycin (VANCOCIN) capsule 125 mg       See Hyperspace for full Linked Orders Report.   125 mg Oral Every 3 DAYS 12/31/22 1243 02/13/23 0959   01/27/23 1000  vancomycin (VANCOCIN) capsule 125 mg       See Hyperspace for full Linked Orders Report.   125 mg Oral  Every other day 12/31/22 1243 02/04/23 0959   01/20/23 1000  vancomycin (VANCOCIN) capsule 125 mg       See Hyperspace for full Linked Orders Report.   125 mg Oral Daily 12/31/22 1243 01/27/23 0959   01/12/23 2200  vancomycin (VANCOCIN) capsule 125 mg       See Hyperspace for full Linked Orders Report.   125 mg Oral 2 times daily 12/31/22 1243 01/19/23 2159   12/31/22 1800  vancomycin (VANCOCIN) capsule 125 mg       See Hyperspace for full Linked Orders Report.   125 mg Oral 4 times daily 12/31/22 1243 01/12/23 1759   12/28/22 1415  vancomycin (VANCOCIN) 50 mg/mL oral solution SOLN 125 mg  Status:  Discontinued        125 mg Oral Every 6  hours 12/28/22 1323 12/31/22 1243   12/28/22 1000  metroNIDAZOLE (FLAGYL) IVPB 500 mg  Status:  Discontinued        500 mg 100 mL/hr over 60 Minutes Intravenous Every 12 hours 12/28/22 0410 12/28/22 1426   12/27/22 2345  metroNIDAZOLE (FLAGYL) IVPB 500 mg        500 mg 100 mL/hr over 60 Minutes Intravenous  Once 12/27/22 2344 12/28/22 0246              Family Communication/Anticipated D/C date and plan/Code Status   DVT prophylaxis: enoxaparin (LOVENOX) injection 40 mg Start: 01/08/23 0800 SCDs Start: 12/28/22 0259     Code Status: Full Code  Family Communication: Plan discussed with the husband at the bedside Disposition Plan: Plans to discharge home in 2  days   Status is: Inpatient Remains inpatient appropriate because: Recurrent C. difficile colitis, AKI     Subjective:   Interval events noted.  She thinks diarrhea has decreased.  She feels better.  She is producing more urine.  Her husband was at the bedside.  Objective:    Vitals:   01/08/23 0300 01/08/23 0420 01/08/23 0759 01/08/23 1141  BP: 110/67  104/66 (!) 128/57  Pulse: 98  (!) 102 98  Resp: 18  16 20   Temp: 98.6 F (37 C)  98.3 F (36.8 C) 99.2 F (37.3 C)  TempSrc: Oral   Oral  SpO2: 99%  99%   Weight:  77.2 kg    Height:       No data  found.   Intake/Output Summary (Last 24 hours) at 01/08/2023 1354 Last data filed at 01/08/2023 0955 Gross per 24 hour  Intake 240 ml  Output 2050 ml  Net -1810 ml   Filed Weights   01/06/23 0500 01/07/23 0654 01/08/23 0420  Weight: 84 kg 82 kg 77.2 kg    Exam:   GEN: NAD SKIN: Warm and dry EYES: EOMI ENT: MMM CV: RRR PULM: CTA B ABD: soft, obese/distended, NT, +BS CNS: AAO x 3, non focal EXT: Bilateral lower extremity pitting edema, no tenderness or erythema Rectal tube in place but stool bag was empty at the time of the encounter   Data Reviewed:   I have personally reviewed following labs and imaging studies:  Labs: Labs show the following:   Basic Metabolic Panel: Recent Labs  Lab 01/02/23 0406 01/03/23 0604 01/04/23 0320 01/05/23 0531 01/06/23 0535 01/07/23 0422 01/08/23 0423 01/08/23 0432  NA 137   < > 136 137 138 140  --  138  K 4.2   < > 4.3 4.0 3.8 3.5  --  2.9*  CL 115*   < > 115* 115* 116* 114*  --  106  CO2 18*   < > 17* 16* 16* 18*  --  25  GLUCOSE 110*   < > 151* 92 93 102*  --  108*  BUN 26*   < > 34* 38* 42* 42*  --  41*  CREATININE 1.39*   < > 1.88* 2.07* 1.77* 1.57*  --  1.29*  CALCIUM 7.6*   < > 7.7* 7.8* 7.6* 7.4*  --  7.7*  MG 1.8  --   --   --   --   --  1.6*  --   PHOS 2.9  --   --   --   --   --   --  2.9   < > = values in this interval not displayed.   GFR Estimated  Creatinine Clearance: 40.8 mL/min (A) (by C-G formula based on SCr of 1.29 mg/dL (H)). Liver Function Tests: Recent Labs  Lab 01/04/23 0320 01/08/23 0432  AST 17  --   ALT 9  --   ALKPHOS 86  --   BILITOT 0.5  --   PROT 4.9*  --   ALBUMIN 2.1* 2.2*   No results for input(s): "LIPASE", "AMYLASE" in the last 168 hours. No results for input(s): "AMMONIA" in the last 168 hours. Coagulation profile No results for input(s): "INR", "PROTIME" in the last 168 hours.  CBC: Recent Labs  Lab 01/02/23 0406 01/03/23 0604 01/05/23 0531 01/07/23 0422  01/08/23 0432  WBC 14.1* 10.9* 13.5* 6.2 8.8  NEUTROABS  --  10.1* 10.6* 4.9 6.8  HGB 10.7* 9.7* 9.3* 8.4* 8.3*  HCT 34.9* 31.6* 30.3* 26.8* 26.2*  MCV 89.7 88.5 89.6 87.9 87.6  PLT 397 361 347 201 263   Cardiac Enzymes: No results for input(s): "CKTOTAL", "CKMB", "CKMBINDEX", "TROPONINI" in the last 168 hours. BNP (last 3 results) No results for input(s): "PROBNP" in the last 8760 hours. CBG: Recent Labs  Lab 01/03/23 0853 01/03/23 1153 01/03/23 1615 01/04/23 2123 01/08/23 0346  GLUCAP 132* 131* 142* 111* 102*   D-Dimer: No results for input(s): "DDIMER" in the last 72 hours. Hgb A1c: No results for input(s): "HGBA1C" in the last 72 hours. Lipid Profile: No results for input(s): "CHOL", "HDL", "LDLCALC", "TRIG", "CHOLHDL", "LDLDIRECT" in the last 72 hours. Thyroid function studies: No results for input(s): "TSH", "T4TOTAL", "T3FREE", "THYROIDAB" in the last 72 hours.  Invalid input(s): "FREET3" Anemia work up: No results for input(s): "VITAMINB12", "FOLATE", "FERRITIN", "TIBC", "IRON", "RETICCTPCT" in the last 72 hours. Sepsis Labs: Recent Labs  Lab 01/02/23 1417 01/02/23 1658 01/03/23 0604 01/05/23 0531 01/07/23 0422 01/08/23 0432  WBC  --   --  10.9* 13.5* 6.2 8.8  LATICACIDVEN 1.4 1.3  --   --   --   --     Microbiology Recent Results (from the past 240 hour(s))  Urine Culture     Status: Abnormal   Collection Time: 12/30/22 11:43 PM   Specimen: Urine, Random  Result Value Ref Range Status   Specimen Description   Final    URINE, RANDOM Performed at [facility], [address], [place], [place] [address]    Special Requests   Final    NONE Reflexed from [id number] Performed at [facility], [address] [place], [place] [address]    Culture (A)  Final    >=100,000 COLONIES/mL KLEBSIELLA OXYTOCA 70,000 COLONIES/mL ENTEROCOCCUS FAECALIS Confirmed Extended Spectrum Beta-Lactamase Producer (ESBL).  In bloodstream infections from  ESBL organisms, carbapenems are preferred over piperacillin/tazobactam. They are shown to have a lower risk of mortality.    Report Status 01/02/2023 FINAL  Final   Organism ID, Bacteria KLEBSIELLA OXYTOCA (A)  Final   Organism ID, Bacteria ENTEROCOCCUS FAECALIS (A)  Final      Susceptibility   Enterococcus faecalis - MIC*    AMPICILLIN <=2 SENSITIVE Sensitive     NITROFURANTOIN <=16 SENSITIVE Sensitive     VANCOMYCIN 1 SENSITIVE Sensitive     * 70,000 COLONIES/mL ENTEROCOCCUS FAECALIS   Klebsiella oxytoca - MIC*    AMPICILLIN >=32 RESISTANT Resistant     CEFEPIME >=32 RESISTANT Resistant     CEFTRIAXONE >=64 RESISTANT Resistant     CIPROFLOXACIN 1 RESISTANT Resistant     GENTAMICIN >=16 RESISTANT Resistant     IMIPENEM <=0.25 SENSITIVE Sensitive  NITROFURANTOIN 32 SENSITIVE Sensitive     TRIMETH/SULFA >=320 RESISTANT Resistant     AMPICILLIN/SULBACTAM >=32 RESISTANT Resistant     PIP/TAZO <=4 SENSITIVE Sensitive     * >=100,000 COLONIES/mL KLEBSIELLA OXYTOCA  MRSA Next Gen by PCR, Nasal     Status: None   Collection Time: 12/31/22 12:46 AM   Specimen: Nasal Mucosa; Nasal Swab  Result Value Ref Range Status   MRSA by PCR Next Gen NOT DETECTED NOT DETECTED Final    Comment: (NOTE) The GeneXpert MRSA Assay (FDA approved for NASAL specimens only), is one component of a comprehensive MRSA colonization surveillance program. It is not intended to diagnose MRSA infection nor to guide or monitor treatment for MRSA infections. Test performance is not FDA approved in patients less than 13 years old. Performed at [facility], [address] [place], [place] [address]     Procedures and diagnostic studies:  No results found.             LOS: 11 days   Al Gagen  [facility] Chartered loss adjuster on [email or web address]. If 7PM-7AM, please contact night-coverage at [email or web address]     01/08/2023, 1:54 PM

## 2023-01-08 NOTE — Progress Notes (Signed)
PT Cancellation Note  Patient Details Name: Jeanne [name] MRN: [id number] DOB: 10-02-1950   Cancelled Treatment:     Therapist in to see pt this pm, who refused any activity due to c/o severe pain and inability to tolerate any activity. Pt educated on importance and benefits of mobility however pt unwilling to attempt. Will re-attempt next available date/time per POC.   [name] 01/08/2023, 2:20 PM

## 2023-01-08 NOTE — Progress Notes (Signed)
Nutrition Follow Up Note   DOCUMENTATION CODES:   Not applicable  INTERVENTION:   Pro-Source Plus 30ml PO TID- Each supplement provides 100kcal and 15g protein    MVI po daily  Vitamin C 500mg  po BID  Vitamin D 1000 units po daily   Vitamin A 10,000 units po daily   Zinc 220mg  po daily x 30 days  Juven Fruit Punch po BID, each serving provides 95kcal and 2.5g of protein (amino acids glutamine and arginine)  Daily weights  Snacks- 10am, 2pm & 8pm   NUTRITION DIAGNOSIS:   Inadequate oral intake related to acute illness as evidenced by per patient/family report. -improved   GOAL:   Patient will meet greater than or equal to 90% of their needs -progressing   MONITOR:   PO intake, Supplement acceptance, Diet advancement, Labs, Weight trends, I & O's, Skin  ASSESSMENT:   72 y/o female with h/o HTN, cervical cancer, RA, C. diff, CKD III and recent COVID 19 who is admitted with pancolitis and sepsis.  Met with pt in room today. Pt reports that she is feeling much better. Pt reports her appetite and oral intake is improving; pt documented to be eating 100% of meals in hospital. Pt reports that she continues to drink the Juven and take the ProSource. Pt is eating yogurt for snacks. Pt with numerous vitamin deficiencies noted; these are being supplemented. Pt will need to continue supplementation for 30 days after discharge and follow up with her PCP to recheck labs; this was discussed today with patient. Per chart, pt is up ~20lbs since admission. Pt +9.1L on her I & Os. Pt is receiving lasix. Rectal tube remains in place.      Medications reviewed and include: vitamin C, D3, lovenox, lasix, MVI, juven, KCl, protonix, vancomycin, vitamin A, zinc  Labs reviewed: Na 138 wnl, K 2.9(L), creat 1.29(H), P 2.9 wnl, Mg 1.6(L) Folate 9.3 wnl, vitamin D 26.50(L), zinc 37(L), vitamin A 2.5(L), B12 287- 4/20 Hgb 8.3(L), Hct 26.2(L)  Diet Order:   Diet Order             Diet regular  Fluid consistency: Thin  Diet effective now                  EDUCATION NEEDS:   Education needs have been addressed  Skin:  Skin Assessment: Reviewed RN Assessment (non pressure wound buttocks)  Last BM:  4/30- type 7  Height:   Ht Readings from Last 1 Encounters:  12/27/22 5\' 4"  (1.626 m)    Weight:   Wt Readings from Last 1 Encounters:  01/08/23 77.2 kg    Ideal Body Weight:  54.5 kg  BMI:  Body mass index is 29.21 kg/m.  Estimated Nutritional Needs:   Kcal:  1600-1800kcal/day  Protein:  80-90g/day  Fluid:  1.5-1.7L/day  Betsey Holiday MS, RD, LDN Please refer to [facility] for RD and/or RD on-call/weekend/after hours pager

## 2023-01-09 DIAGNOSIS — K5100 Ulcerative (chronic) pancolitis without complications: Secondary | ICD-10-CM | POA: Diagnosis not present

## 2023-01-09 LAB — CBC WITH DIFFERENTIAL/PLATELET
Abs Immature Granulocytes: 0.07 10*3/uL (ref 0.00–0.07)
Basophils Absolute: 0 10*3/uL (ref 0.0–0.1)
Basophils Relative: 0 %
Eosinophils Absolute: 0.2 10*3/uL (ref 0.0–0.5)
Eosinophils Relative: 2 %
HCT: 29.4 % — ABNORMAL LOW (ref 36.0–46.0)
Hemoglobin: 9.2 g/dL — ABNORMAL LOW (ref 12.0–15.0)
Immature Granulocytes: 1 %
Lymphocytes Relative: 13 %
Lymphs Abs: 1.1 10*3/uL (ref 0.7–4.0)
MCH: 27.3 pg (ref 26.0–34.0)
MCHC: 31.3 g/dL (ref 30.0–36.0)
MCV: 87.2 fL (ref 80.0–100.0)
Monocytes Absolute: 0.5 10*3/uL (ref 0.1–1.0)
Monocytes Relative: 6 %
Neutro Abs: 6.3 10*3/uL (ref 1.7–7.7)
Neutrophils Relative %: 78 %
Platelets: 285 10*3/uL (ref 150–400)
RBC: 3.37 MIL/uL — ABNORMAL LOW (ref 3.87–5.11)
RDW: 15.8 % — ABNORMAL HIGH (ref 11.5–15.5)
WBC: 8.2 10*3/uL (ref 4.0–10.5)
nRBC: 0 % (ref 0.0–0.2)

## 2023-01-09 LAB — BASIC METABOLIC PANEL
Anion gap: 5 (ref 5–15)
BUN: 41 mg/dL — ABNORMAL HIGH (ref 8–23)
CO2: 27 mmol/L (ref 22–32)
Calcium: 7.8 mg/dL — ABNORMAL LOW (ref 8.9–10.3)
Chloride: 105 mmol/L (ref 98–111)
Creatinine, Ser: 1.14 mg/dL — ABNORMAL HIGH (ref 0.44–1.00)
GFR, Estimated: 52 mL/min — ABNORMAL LOW (ref >60)
Glucose, Bld: 110 mg/dL — ABNORMAL HIGH (ref 70–99)
Potassium: 4.1 mmol/L (ref 3.5–5.1)
Sodium: 137 mmol/L (ref 135–145)

## 2023-01-09 LAB — GLUCOSE, CAPILLARY: Glucose-Capillary: 113 mg/dL — ABNORMAL HIGH (ref 70–99)

## 2023-01-09 LAB — MAGNESIUM: Magnesium: 1.9 mg/dL (ref 1.7–2.4)

## 2023-01-09 MED ORDER — CHOLESTYRAMINE LIGHT 4 G PO PACK
2.0000 g | PACK | Freq: Once | ORAL | Status: AC
  Administered 2023-01-09: 2 g via ORAL
  Filled 2023-01-09: qty 1

## 2023-01-09 NOTE — Hospital Course (Signed)
PMH of rheumatoid arthritis, cervical cancer, HTN, CKD 3B presents to the hospital with complaints of diarrhea was found to have pancolitis with septic shock secondary to C. difficile infection.  She never required IV vasopressors but was treated with IV fluid and IV albumin.  Currently on midodrine.  ID was added for C. difficile infection.  Concerning for recurrent infection currently pulse therapy with vancomycin.  Also had AKI nephrology following.

## 2023-01-09 NOTE — Progress Notes (Signed)
[facility] Progress Note Patient: Jeanne [name] ZOX:[id number] DOB: July 08, 1951 DOA: 12/27/2022  DOS: the patient was seen and examined on 01/09/2023  Brief hospital course: PMH of rheumatoid arthritis, cervical cancer, HTN, CKD 3B presents to the hospital with complaints of diarrhea was found to have pancolitis with septic shock secondary to C. difficile infection.  She never required IV vasopressors but was treated with IV fluid and IV albumin.  Currently on midodrine.  ID was added for C. difficile infection.  Concerning for recurrent infection currently pulse therapy with vancomycin.  Also had AKI nephrology following. 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 Presents with abdominal pain and diarrhea. Found to have pancolitis on CT abdomen. Recently treated for CAD prior to admission. Due to ongoing diarrhea ID was consulted with regards to concern for C. difficile reoccurrence. Currently on pulse dose vancomycin therapy. Unable to afford Dificid outside of the hospital. Has needed rectal tube.  Will attempt low-dose Questran back. Was treated with IV fluid as well as IV albumin.  Never required IV pressors.  Also received IV hydrocortisone.  Currently on midodrine therapy. Blood pressure is significantly better.  Will monitor.  Acute kidney injury on CKD 3B. Anasarca. Hypoalbuminemia. Non-anion gap metabolic acidosis. Nephrology following. Receiving IV Lasix as well as bicarb therapy. Ultrasound shows evidence of mild bilateral hydronephrosis. Patient was on diclofenac which was discontinued due to her AKI. Lower extremity Doppler negative for any acute DVT but does show age-indeterminate nonocclusive SVT.  Acute HFpEF. Echocardiogram shows EF of 6065%. No wall motion abnormality. Currently receiving diuretic therapy.  Rheumatoid arthritis. Not on any therapy. Oxycodone as needed for pain control. Diclofenac was discontinued due to  AKI.  History of right total hip arthroplasty. Ongoing pain issues. Gets [facility]. Outpatient follow-up with Dr. Leontine Locket after discharge from the hospital.   Bilateral hydronephrosis with nephrolithiasis. Outpatient follow-up with urology recommended.  Normocytic anemia. Likely from poor p.o. intake. Monitor.  Hypomagnesemia.  Hypokalemia. Replaced.  Subjective: Continues to report pain.  No nausea or vomiting.  No abdominal pain.  Still has soft stool required bag emptying twice so far.  Physical Exam: General: in Mild distress, No Rash Cardiovascular: S1 and S2 Present, No Murmur Respiratory: Good respiratory effort, Bilateral Air entry present. No Crackles, No wheezes Abdomen: Bowel Sound present, No tenderness Extremities: Trace edema Neuro: Alert and oriented x3, no new focal deficit  Data Reviewed: I have Reviewed nursing notes, Vitals, and Lab results. Since last encounter, pertinent lab results CBC and BMP   . I have ordered test including CBC and BMP  .   Disposition: Status is: Inpatient Remains inpatient appropriate because: Need to improve renal function as well as diarrhea frequency  enoxaparin (LOVENOX) injection 40 mg Start: 01/08/23 0800 SCDs Start: 12/28/22 0259   Family Communication: No one at bedside Level of care: Med-Surg  Vitals:   01/08/23 1937 01/09/23 0346 01/09/23 0815 01/09/23 1631  BP: (!) 101/57 (!) 100/56 119/66 (!) 113/51  Pulse: 90 79 84 81  Resp: 18 18 16 20   Temp: 98.4 F (36.9 C) 98.3 F (36.8 C) 98.7 F (37.1 C) 98.8 F (37.1 C)  TempSrc:      SpO2: 97% 100% 100% 98%  Weight:  74.9 kg    Height:         Author: Lynden Oxford, MD 01/09/2023 6:48 PM  Please look on [email or web address] to find out who is on call.

## 2023-01-09 NOTE — Progress Notes (Signed)
Occupational Therapy Treatment Patient Details Name: Jeanne [name] MRN: [id number] DOB: 05/04/1951 Today's Date: 01/09/2023   History of present illness 72 y/o female recently hospitalized with C.Diff from 11/09/22-11/12/22, discharged with PO vancomycin. She states she completed this antibiotic course as prescribed. She reported that she did have resolution of her symptoms for a few weeks. She also had been COVID 19 + during this admission, but was asymptomatic and did not require treatment.  She reports that over the past week she has been nauseous and had poor PO intake, but denied vomiting. Over the last 4 days she has had multiple bouts of non-bloody diarrhea and bilateral lower quadrant abdominal pain/cramping. She reported these episodes seemed exactly the same in consistency and smell as during her previous C.Diff infection. She also reports generalized fatigue.   OT comments  Jeanne [name] was seen for OT treatment on this date. Upon arrival to room pt reclined in bed, agreeable to tx. Pt requires MOD A L+R rolling for clean chux placement. Pt tolerated supine therex as described below. Educated on supine core exercises with bed rail assistance. Pt continues to self-limit EOB attempts. Pt making progress toward goals, will continue to follow POC. Discharge recommendation remains appropriate.     Recommendations for follow up therapy are one component of a multi-disciplinary discharge planning process, led by the attending physician.  Recommendations may be updated based on patient status, additional functional criteria and insurance authorization.    Assistance Recommended at Discharge Frequent or constant Supervision/Assistance  Patient can return home with the following  A lot of help with bathing/dressing/bathroom;Assistance with cooking/housework;Assist for transportation;Help with stairs or ramp for entrance;Two people to help with walking and/or transfers   Equipment Recommendations  None  recommended by OT    Recommendations for Other Services      Precautions / Restrictions Precautions Precautions: Fall Restrictions Weight Bearing Restrictions: Yes RLE Weight Bearing: Partial weight bearing RLE Partial Weight Bearing Percentage or Pounds: 20% per pt report       Mobility Bed Mobility Overal bed mobility: Needs Assistance Bed Mobility: Rolling Rolling: Mod assist         General bed mobility comments: pt deferred further mobility    Transfers      Pt unable to attempt                       ADL either performed or assessed with clinical judgement   ADL Overall ADL's : Needs assistance/impaired                                       General ADL Comments: MOD A rolling at bed level for toileting      Cognition Arousal/Alertness: Awake/alert Behavior During Therapy: WFL for tasks assessed/performed Overall Cognitive Status: No family/caregiver present to determine baseline cognitive functioning                                          Exercises Exercises: General Lower Extremity General Exercises - Lower Extremity Ankle Circles/Pumps: AROM, Right, Left, Strengthening, Both, 10 reps, Supine Quad Sets: AROM, Right, Left, Strengthening, Both, 10 reps, Supine Gluteal Sets: AROM, Right, Left, Strengthening, Both, 10 reps, Supine Short Arc Quad: AROM, Right, Left, Strengthening, Both, 10 reps, Supine Hip ABduction/ADduction: AROM, Right, Left,  Strengthening, Both, 10 reps, Supine            Pertinent Vitals/ Pain       Pain Assessment Pain Assessment: Faces Faces Pain Scale: Hurts little more Pain Location: R hip Pain Descriptors / Indicators: Discomfort, Sore Pain Intervention(s): Limited activity within patient's tolerance, Repositioned   Frequency  Min 1X/week        Progress Toward Goals  OT Goals(current goals can now be found in the care plan section)  Progress towards OT goals:  Progressing toward goals  Acute Rehab OT Goals Patient Stated Goal: to go home OT Goal Formulation: With patient Time For Goal Achievement: 01/16/23 Potential to Achieve Goals: Fair ADL Goals Pt Will Perform Grooming: sitting;with supervision Pt Will Perform Lower Body Dressing: with min assist;bed level Pt/caregiver will Perform Home Exercise Program: Increased strength;Both right and left upper extremity;With theraband;With written HEP provided;With Supervision  Plan Discharge plan remains appropriate;Frequency remains appropriate    Co-evaluation                 AM-PAC OT "6 Clicks" Daily Activity     Outcome Measure   Help from another person eating meals?: A Little Help from another person taking care of personal grooming?: A Little Help from another person toileting, which includes using toliet, bedpan, or urinal?: Total Help from another person bathing (including washing, rinsing, drying)?: A Lot Help from another person to put on and taking off regular upper body clothing?: A Little Help from another person to put on and taking off regular lower body clothing?: Total 6 Click Score: 13    End of Session    OT Visit Diagnosis: Muscle weakness (generalized) (M62.81)   Activity Tolerance Patient tolerated treatment well   Patient Left in bed;with call bell/phone within reach   Nurse Communication          Time: 6045-4098 OT Time Calculation (min): 24 min  Charges: OT General Charges $OT Visit: 1 Visit OT Treatments $Self Care/Home Management : 8-22 mins $Therapeutic Exercise: 8-22 mins  Kathie Dike, M.S. OTR/L  01/09/23, 3:01 PM  ascom [phone]

## 2023-01-09 NOTE — Plan of Care (Signed)

## 2023-01-10 DIAGNOSIS — K5100 Ulcerative (chronic) pancolitis without complications: Secondary | ICD-10-CM | POA: Diagnosis not present

## 2023-01-10 LAB — RENAL FUNCTION PANEL
Albumin: 2.1 g/dL — ABNORMAL LOW (ref 3.5–5.0)
Anion gap: 9 (ref 5–15)
BUN: 37 mg/dL — ABNORMAL HIGH (ref 8–23)
CO2: 29 mmol/L (ref 22–32)
Calcium: 7.9 mg/dL — ABNORMAL LOW (ref 8.9–10.3)
Chloride: 99 mmol/L (ref 98–111)
Creatinine, Ser: 1.07 mg/dL — ABNORMAL HIGH (ref 0.44–1.00)
GFR, Estimated: 56 mL/min — ABNORMAL LOW (ref >60)
Glucose, Bld: 108 mg/dL — ABNORMAL HIGH (ref 70–99)
Phosphorus: 3 mg/dL (ref 2.5–4.6)
Potassium: 3.9 mmol/L (ref 3.5–5.1)
Sodium: 137 mmol/L (ref 135–145)

## 2023-01-10 LAB — MAGNESIUM: Magnesium: 1.8 mg/dL (ref 1.7–2.4)

## 2023-01-10 LAB — GLUCOSE, CAPILLARY: Glucose-Capillary: 101 mg/dL — ABNORMAL HIGH (ref 70–99)

## 2023-01-10 MED ORDER — HYDROXYZINE HCL 25 MG PO TABS
25.0000 mg | ORAL_TABLET | Freq: Three times a day (TID) | ORAL | Status: DC | PRN
  Administered 2023-01-10 – 2023-01-13 (×4): 25 mg via ORAL
  Filled 2023-01-10 (×4): qty 1

## 2023-01-10 NOTE — Plan of Care (Signed)
Pt alert and oriented x 4. Received 2 doses of pain meds during overnight. Vitals stable. Rectal tube and purewick in place. Mobility was turning due to limited movement/max assist and nausea. Pt requested to only be turned Problem: Education: Goal: Knowledge of General Education information will improve Description: Including pain rating scale, medication(s)/side effects and non-pharmacologic comfort measures Outcome: Progressing   Problem: Health Behavior/Discharge Planning: Goal: Ability to manage health-related needs will improve Outcome: Progressing   Problem: Clinical Measurements: Goal: Ability to maintain clinical measurements within normal limits will improve Outcome: Progressing Goal: Will remain free from infection Outcome: Progressing Goal: Diagnostic test results will improve Outcome: Progressing Goal: Respiratory complications will improve Outcome: Progressing Goal: Cardiovascular complication will be avoided Outcome: Progressing   Problem: Activity: Goal: Risk for activity intolerance will decrease Outcome: Progressing   Problem: Nutrition: Goal: Adequate nutrition will be maintained Outcome: Progressing   Problem: Coping: Goal: Level of anxiety will decrease Outcome: Progressing   Problem: Elimination: Goal: Will not experience complications related to bowel motility Outcome: Progressing Goal: Will not experience complications related to urinary retention Outcome: Progressing   Problem: Pain Managment: Goal: General experience of comfort will improve Outcome: Progressing   Problem: Safety: Goal: Ability to remain free from injury will improve Outcome: Progressing   Problem: Skin Integrity: Goal: Risk for impaired skin integrity will decrease Outcome: Progressing

## 2023-01-10 NOTE — Progress Notes (Signed)
[facility] Progress Note Patient: Jeanne [name] ZOX:[id number] DOB: 09/12/1950 DOA: 12/27/2022  DOS: the patient was seen and examined on 01/10/2023  Brief hospital course: PMH of rheumatoid arthritis, cervical cancer, HTN, CKD 3B presents to the hospital with complaints of diarrhea was found to have pancolitis with septic shock secondary to C. difficile infection.  She never required IV vasopressors but was treated with IV fluid and IV albumin.  Currently on midodrine.  ID was added for C. difficile infection.  Concerning for recurrent infection currently pulse therapy with vancomycin.  Also had AKI nephrology following. 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 Presents with abdominal pain and diarrhea. Found to have pancolitis on CT abdomen. Recently treated for CAD prior to admission. Due to ongoing diarrhea ID was consulted with regards to concern for C. difficile reoccurrence. Currently on pulse dose vancomycin therapy. Unable to afford Dificid outside of the hospital. Has needed rectal tube.  No benefit from low-dose Questran.  And had some itching after that. Was treated with IV fluid as well as IV albumin.  Never required IV pressors.  Also received IV hydrocortisone.  Currently on midodrine therapy. Blood pressure is significantly better.  Will monitor.  Acute kidney injury on CKD 3B. Anasarca. Hypoalbuminemia. Non-anion gap metabolic acidosis. Nephrology following. Receiving IV Lasix as well as bicarb therapy. Ultrasound shows evidence of mild bilateral hydronephrosis. Patient was on diclofenac which was discontinued due to her AKI. Lower extremity Doppler negative for any acute DVT but does show age-indeterminate nonocclusive SVT.  Acute HFpEF. Echocardiogram shows EF of 6065%. No wall motion abnormality. Currently receiving diuretic therapy.  Rheumatoid arthritis. Not on any therapy. Oxycodone as needed for pain  control. Diclofenac was discontinued due to AKI.  History of right total hip arthroplasty. Ongoing pain issues. Gets her care at [facility]. Outpatient follow-up with Dr. Leontine Locket after discharge from the hospital.   Bilateral hydronephrosis with nephrolithiasis. Outpatient follow-up with urology recommended.  Normocytic anemia. Likely from poor p.o. intake. Monitor.  Hypomagnesemia.  Hypokalemia. Replaced.  Itching. Suspected developed after Questran. No rash.  Atarax..  Subjective: No nausea no vomiting.  Abdominal pain improving.  Diarrhea about the same.  Physical Exam: S1-S2 present. Clear to auscultation. Bowel sound present. No tenderness. Bilateral improving edema.  Data Reviewed: I have Reviewed nursing notes, Vitals, and Lab results. Since last encounter, pertinent lab results CBC and BMP   . I have ordered test including CBC and BMP  .    Disposition: Status is: Inpatient Remains inpatient appropriate because: Need to improve renal function as well as diarrhea frequency  enoxaparin (LOVENOX) injection 40 mg Start: 01/08/23 0800 SCDs Start: 12/28/22 0259   Family Communication: No one at bedside Level of care: Med-Surg  Vitals:   01/09/23 2032 01/10/23 0424 01/10/23 0910 01/10/23 1723  BP: (!) 109/52 (!) 95/50 (!) 101/54 94/75  Pulse: 82 78 94 81  Resp: 18 18 16 16   Temp: 98.6 F (37 C) 98 F (36.7 C) 98.4 F (36.9 C) 98.7 F (37.1 C)  TempSrc:      SpO2: 98% 98% 100% 99%  Weight:  73.9 kg    Height:         Author: Lynden Oxford, MD 01/10/2023 6:24 PM  Please look on [email or web address] to find out who is on call.

## 2023-01-10 NOTE — TOC Progression Note (Signed)
Transition of Care [facility]) - Progression Note    Patient Details  Name: Jeanne [name] MRN: [id number] Date of Birth: 01/11/1951  Transition of [facility]) CM/SW Contact  Chapman Fitch, RN Phone Number: 01/10/2023, 10:33 AM  Clinical Narrative:     Plan remains to return home at discharge with home health services through [facility] Patient still with rectal tube in place   Expected Discharge Plan: Home/Self Care Barriers to Discharge: Continued Medical Work up  Expected Discharge Plan and Services     Post Acute Care Choice: NA Living arrangements for the past 2 months: Single Family Home                                       Social Determinants of Health (SDOH) Interventions SDOH Screenings   Food Insecurity: No Food Insecurity (12/28/2022)  Housing: Low Risk  (12/28/2022)  Transportation Needs: No Transportation Needs (12/28/2022)  Utilities: Not At Risk (12/28/2022)  Alcohol Screen: Low Risk  (08/10/2020)  Depression (PHQ2-9): Low Risk  (08/10/2020)  Financial Resource Strain: Low Risk  (08/10/2020)  Physical Activity: Inactive (08/10/2020)  Social Connections: Moderately Integrated (08/10/2020)  Stress: No Stress Concern Present (08/10/2020)  Tobacco Use: Medium Risk (01/02/2023)    Readmission Risk Interventions    01/02/2023   12:24 PM  Readmission Risk Prevention Plan  Transportation Screening Complete  PCP or Specialist Appt within 3-5 Days Complete  Social Work Consult for Recovery Care Planning/Counseling Complete  Palliative Care Screening Not Applicable  Medication Review Oceanographer) Complete

## 2023-01-10 NOTE — Progress Notes (Signed)
Physical Therapy Treatment Patient Details Name: Jeanne [name] MRN: [id number] DOB: [birth year] Today's Date: 01/10/2023   History of Present Illness 72 y/o female recently hospitalized with C.Diff from 11/09/22-11/12/22, discharged with PO vancomycin. She states she completed this antibiotic course as prescribed. She reported that she did have resolution of her symptoms for a few weeks. She also had been COVID 19 + during this admission, but was asymptomatic and did not require treatment.  She reports that over the past week she has been nauseous and had poor PO intake, but denied vomiting. Over the last 4 days she has had multiple bouts of non-bloody diarrhea and bilateral lower quadrant abdominal pain/cramping. She reported these episodes seemed exactly the same in consistency and smell as during her previous C.Diff infection. She also reports generalized fatigue.    PT Comments    Pt received upright in bed remaining self limiting in participation due to whole body pains from RA and discomfort from rectal tube. Pt aware of benefits of OOB mobility as per EMR she has been educated regularly. Attempts made to progress therex using bed features for upright sitting using gravity as resistance and manual resistance to progress muscle load with good success on LLE and fair to poor on RLE due to joint restrictions and muscle weakness. PT frequency decreased due to limited participation. D/c recs remain appropriate.     Recommendations for follow up therapy are one component of a multi-disciplinary discharge planning process, led by the attending physician.  Recommendations may be updated based on patient status, additional functional criteria and insurance authorization.  Follow Up Recommendations  Can patient physically be transported by private vehicle: No    Assistance Recommended at Discharge Frequent or constant Supervision/Assistance  Patient can return home with the following Two people to help  with walking and/or transfers;A lot of help with bathing/dressing/bathroom;Assistance with cooking/housework;Assist for transportation;Help with stairs or ramp for entrance   Equipment Recommendations  None recommended by PT    Recommendations for Other Services       Precautions / Restrictions Precautions Precautions: Fall Restrictions Weight Bearing Restrictions: Yes RLE Weight Bearing: Partial weight bearing RLE Partial Weight Bearing Percentage or Pounds: 20% per pt report Other Position/Activity Restrictions: Pt reports PWB s/p THA in October.     Mobility  Bed Mobility                 Patient Response: Cooperative  Transfers                        Ambulation/Gait                   Stairs             Wheelchair Mobility    Modified Rankin (Stroke Patients Only)       Balance                                            Cognition Arousal/Alertness: Awake/alert Behavior During Therapy: WFL for tasks assessed/performed Overall Cognitive Status: No family/caregiver present to determine baseline cognitive functioning                                 General Comments: remains self limiting and not willing to attempt sitting EOB.  Exercises General Exercises - Upper Extremity Shoulder Flexion: AROM, Strengthening, Both, 10 reps, Seated Shoulder ABduction: AROM, Strengthening, Seated, Both, 10 reps Elbow Flexion: AROM, Strengthening, Both, 10 reps, Seated (in bed position against gravity, partial ranges of motion. Manual resistance.) Elbow Extension: AROM, Seated, Strengthening, Both, 10 reps (in bed position against gravity, partial ranges of motion. Manual resistance.) General Exercises - Lower Extremity Ankle Circles/Pumps: AROM, Strengthening, Both, 15 reps, Seated (in bed position against gravity, partial ranges of motion) Short Arc Quad: AROM, Right, Left, Strengthening, Both, 10 reps,  Seated (in bed position against gravity, partial ranges of motion) Heel Slides: AAROM, Both, 10 reps, Strengthening, Seated (in bed position against gravity, partial ranges of motion) Hip ABduction/ADduction: AROM, Right, Left, Strengthening, Both, 10 reps, Supine    General Comments        Pertinent Vitals/Pain Pain Assessment Pain Assessment: Faces Faces Pain Scale: Hurts little more Pain Location: whole body Pain Descriptors / Indicators: Discomfort Pain Intervention(s): Limited activity within patient's tolerance, Repositioned    Home Living                          Prior Function            PT Goals (current goals can now be found in the care plan section) Acute Rehab PT Goals Patient Stated Goal: Feel better and go home PT Goal Formulation: With patient Time For Goal Achievement: 01/15/23 Potential to Achieve Goals: Fair Progress towards PT goals: Not progressing toward goals - comment (declining OOB or seated EOB attempts with all disciplines.)    Frequency    Min 2X/week      PT Plan Current plan remains appropriate    Co-evaluation              AM-PAC PT "6 Clicks" Mobility   Outcome Measure  Help needed turning from your back to your side while in a flat bed without using bedrails?: A Lot Help needed moving from lying on your back to sitting on the side of a flat bed without using bedrails?: Total Help needed moving to and from a bed to a chair (including a wheelchair)?: Total Help needed standing up from a chair using your arms (e.g., wheelchair or bedside chair)?: Total Help needed to walk in hospital room?: Total Help needed climbing 3-5 steps with a railing? : Total 6 Click Score: 7    End of Session   Activity Tolerance: Patient limited by pain Patient left: in bed;with call bell/phone within reach;with bed alarm set Nurse Communication: Mobility status PT Visit Diagnosis: Difficulty in walking, not elsewhere classified  (R26.2);Muscle weakness (generalized) (M62.81);Pain Pain - part of body:  (whole body from RA)     Time: 1331-1350 PT Time Calculation (min) (ACUTE ONLY): 19 min  Charges:  $Therapeutic Exercise: 8-22 mins                    Delphia Grates. Fairly IV, PT, DPT Physical Therapist- [place]  [facility]  01/10/2023, 2:46 PM

## 2023-01-11 DIAGNOSIS — K5100 Ulcerative (chronic) pancolitis without complications: Secondary | ICD-10-CM | POA: Diagnosis not present

## 2023-01-11 LAB — GLUCOSE, CAPILLARY
Glucose-Capillary: 94 mg/dL (ref 70–99)
Glucose-Capillary: 88 mg/dL (ref 70–99)

## 2023-01-11 LAB — RENAL FUNCTION PANEL
Albumin: 2.2 g/dL — ABNORMAL LOW (ref 3.5–5.0)
Anion gap: 9 (ref 5–15)
BUN: 40 mg/dL — ABNORMAL HIGH (ref 8–23)
CO2: 31 mmol/L (ref 22–32)
Calcium: 8.1 mg/dL — ABNORMAL LOW (ref 8.9–10.3)
Chloride: 98 mmol/L (ref 98–111)
Creatinine, Ser: 1.08 mg/dL — ABNORMAL HIGH (ref 0.44–1.00)
GFR, Estimated: 55 mL/min — ABNORMAL LOW (ref >60)
Glucose, Bld: 99 mg/dL (ref 70–99)
Phosphorus: 3 mg/dL (ref 2.5–4.6)
Potassium: 3.5 mmol/L (ref 3.5–5.1)
Sodium: 138 mmol/L (ref 135–145)

## 2023-01-11 LAB — MAGNESIUM: Magnesium: 1.8 mg/dL (ref 1.7–2.4)

## 2023-01-11 MED ORDER — ALBUMIN HUMAN 25 % IV SOLN
12.5000 g | Freq: Once | INTRAVENOUS | Status: AC
  Administered 2023-01-12: 12.5 g via INTRAVENOUS
  Filled 2023-01-11: qty 50

## 2023-01-11 NOTE — Progress Notes (Signed)
[facility] Progress Note Patient: Jeanne [name]:[id number] DOB: 03-11-1951 DOA: 12/27/2022  DOS: the patient was seen and examined on 01/11/2023  Brief hospital course: PMH of rheumatoid arthritis, cervical cancer, HTN, CKD 3B presents to the hospital with complaints of diarrhea was found to have pancolitis with septic shock secondary to C. difficile infection.  She never required IV vasopressors but was treated with IV fluid and IV albumin.  Currently on midodrine.  ID was added for C. difficile infection.  Concerning for recurrent infection currently pulse therapy with vancomycin.  Also had AKI nephrology following. 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 Presents with abdominal pain and diarrhea. Found to have pancolitis on CT abdomen. Recently treated for CAD prior to admission. Due to ongoing diarrhea ID was consulted with regards to concern for C. difficile reoccurrence. Currently on pulse dose vancomycin therapy. Unable to afford Dificid outside of the hospital. Has needed rectal tube.  No benefit from low-dose Questran.  And had some itching after that. Was treated with IV fluid as well as IV albumin.  Never required IV pressors.  Also received IV hydrocortisone.  Currently on midodrine therapy. Blood pressure is significantly better.  Will monitor.  Acute kidney injury on CKD 3B. Anasarca. Hypoalbuminemia. Non-anion gap metabolic acidosis. Nephrology following. Receiving IV Lasix as well as bicarb therapy.  Will provide IV albumin 1 more time on 5/4 before IV Lasix. Ultrasound shows evidence of mild bilateral hydronephrosis. Patient was on diclofenac which was discontinued due to her AKI. Lower extremity Doppler negative for any acute DVT but does show age-indeterminate nonocclusive SVT.  Acute HFpEF. Echocardiogram shows EF of 60/65%. No wall motion abnormality. Currently receiving diuretic therapy.  Rheumatoid  arthritis. Not on any therapy. Oxycodone as needed for pain control. Diclofenac was discontinued due to AKI.  History of right total hip arthroplasty. Ongoing pain issues. Gets her care at [facility]. Outpatient follow-up with Dr. Leontine Locket after discharge from the hospital.   Bilateral hydronephrosis with nephrolithiasis. Outpatient follow-up with urology recommended.  Normocytic anemia. Likely from poor p.o. intake. Monitor.  Hypomagnesemia.  Hypokalemia. Replaced.  Itching. Suspected developed after Questran. No rash.  Atarax..  Subjective: No significant change in diarrhea.  No nausea no vomiting.  Continues to have swelling in the leg.  Physical Exam: S1 and 2 present. Clear to auscultation. Bowel sound present. Nontender. Still has swelling in the leg.  Data Reviewed: I have Reviewed nursing notes, Vitals, and Lab results. Reviewed BMP.  Reordered BMP and magnesium.  Disposition: Status is: Inpatient Remains inpatient appropriate because: Need to improve renal function as well as diarrhea frequency  enoxaparin (LOVENOX) injection 40 mg Start: 01/08/23 0800 SCDs Start: 12/28/22 0259   Family Communication: No one at bedside Level of care: Med-Surg  Vitals:   01/10/23 2039 01/11/23 0455 01/11/23 0500 01/11/23 0843  BP: 100/70 (!) 110/59  107/60  Pulse: 79 82  91  Resp: 16 16  18   Temp: 98.5 F (36.9 C) 98.3 F (36.8 C)  98.3 F (36.8 C)  TempSrc: Oral Oral    SpO2: 99% 97%  94%  Weight:   70.3 kg   Height:         Author: Lynden Oxford, MD 01/11/2023 6:11 PM  Please look on [email or web address] to find out who is on call.

## 2023-01-12 DIAGNOSIS — K5100 Ulcerative (chronic) pancolitis without complications: Secondary | ICD-10-CM | POA: Diagnosis not present

## 2023-01-12 LAB — RENAL FUNCTION PANEL
Albumin: 2.4 g/dL — ABNORMAL LOW (ref 3.5–5.0)
Anion gap: 9 (ref 5–15)
BUN: 33 mg/dL — ABNORMAL HIGH (ref 8–23)
CO2: 30 mmol/L (ref 22–32)
Calcium: 8 mg/dL — ABNORMAL LOW (ref 8.9–10.3)
Chloride: 96 mmol/L — ABNORMAL LOW (ref 98–111)
Creatinine, Ser: 1.12 mg/dL — ABNORMAL HIGH (ref 0.44–1.00)
GFR, Estimated: 53 mL/min — ABNORMAL LOW (ref >60)
Glucose, Bld: 104 mg/dL — ABNORMAL HIGH (ref 70–99)
Phosphorus: 3.2 mg/dL (ref 2.5–4.6)
Potassium: 3.2 mmol/L — ABNORMAL LOW (ref 3.5–5.1)
Sodium: 135 mmol/L (ref 135–145)

## 2023-01-12 LAB — GLUCOSE, CAPILLARY
Glucose-Capillary: 77 mg/dL (ref 70–99)
Glucose-Capillary: 99 mg/dL (ref 70–99)

## 2023-01-12 LAB — MAGNESIUM: Magnesium: 2 mg/dL (ref 1.7–2.4)

## 2023-01-12 MED ORDER — POTASSIUM CHLORIDE 10 MEQ/100ML IV SOLN
10.0000 meq | INTRAVENOUS | Status: AC
  Administered 2023-01-12 – 2023-01-13 (×4): 10 meq via INTRAVENOUS
  Filled 2023-01-12 (×3): qty 100

## 2023-01-12 NOTE — Progress Notes (Signed)
PROGRESS NOTE  Jeanne [name]:[id number] DOB: 1951-09-01 DOA: 12/27/2022 PCP: Jerl Mina, MD  Brief History   PMH of rheumatoid arthritis, cervical cancer, HTN, CKD 3B presents to the hospital with complaints of diarrhea was found to have pancolitis with septic shock secondary to C. difficile infection. She never required IV vasopressors but was treated with IV fluid and IV albumin. Currently on midodrine. ID was added for C. difficile infection. Concerning for recurrent infection currently pulse therapy with vancomycin. Also had AKI nephrology following.   The patient has a rectal tube for C Diff colitis. On 01/12/2023 there is semi-solid stool in the collection bag.   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 Presents with abdominal pain and diarrhea. Found to have pancolitis on CT abdomen. Recently treated for CAD prior to admission. Due to ongoing diarrhea ID was consulted with regards to concern for C. difficile reoccurrence. Currently on pulse dose vancomycin therapy. Unable to afford Dificid outside of the hospital. Has needed rectal tube. Possibly may remove tomorrow. Semi solid stool in tube today.  No benefit from low-dose Questran.  And had some itching after that. Was treated with IV fluid as well as IV albumin.  Never required IV pressors.  Also received IV hydrocortisone.  Currently on midodrine therapy. Blood pressure is significantly better.  Will monitor.   Acute kidney injury on CKD 3B. Anasarca. Hypoalbuminemia. Non-anion gap metabolic acidosis. Nephrology following. Receiving IV Lasix as well as bicarb therapy.  Will provide IV albumin 1 more time on 5/4 before IV Lasix. Ultrasound shows evidence of mild bilateral hydronephrosis. Patient was on diclofenac which was discontinued due to her AKI. Lower extremity Doppler negative for any acute DVT but does show age-indeterminate nonocclusive SVT.   Acute HFpEF. Echocardiogram shows EF of  60/65%. No wall motion abnormality. Currently receiving diuretic therapy.   Rheumatoid arthritis. Not on any therapy. Oxycodone as needed for pain control. Diclofenac was discontinued due to AKI.   History of right total hip arthroplasty. Ongoing pain issues. Gets her care at [facility]. Outpatient follow-up with Dr. Leontine Locket after discharge from the hospital.    Bilateral hydronephrosis with nephrolithiasis. Outpatient follow-up with urology recommended.   Normocytic anemia. Likely from poor p.o. intake. Monitor.   Hypomagnesemia.  Hypokalemia. Supplemented.   Itching. Suspected developed after Questran. No rash.  Atarax..  I have seen and examined this patient myself. I have spent 34 minutes in his evaluation and care.  DVT prophylaxis: Lovenox Code Status: Full Code Family Communication: None available Disposition Plan: Home with home health.    Marley Pakula, DO [facility] Direct contact: see [email or web address]  7PM-7AM contact night coverage as above 01/12/2023, 5:26 PM  LOS: 15 days   Consultants  None  Procedures  None  Antibiotics   Anti-infectives (From admission, onward)    Start     Dose/Rate Route Frequency Ordered Stop   02/04/23 1000  vancomycin (VANCOCIN) capsule 125 mg       See Hyperspace for full Linked Orders Report.   125 mg Oral Every 3 DAYS 12/31/22 1243 02/13/23 0959   01/27/23 1000  vancomycin (VANCOCIN) capsule 125 mg       See Hyperspace for full Linked Orders Report.   125 mg Oral Every other day 12/31/22 1243 02/04/23 0959   01/20/23 1000  vancomycin (VANCOCIN) capsule 125 mg       See Hyperspace for full Linked Orders Report.   125 mg Oral Daily 12/31/22  1243 01/27/23 0959   01/12/23 2200  vancomycin (VANCOCIN) capsule 125 mg       See Hyperspace for full Linked Orders Report.   125 mg Oral 2 times daily 12/31/22 1243 01/19/23 2159   12/31/22 1800  vancomycin (VANCOCIN) capsule 125 mg       See Hyperspace for  full Linked Orders Report.   125 mg Oral 4 times daily 12/31/22 1243 01/12/23 1759   12/28/22 1415  vancomycin (VANCOCIN) 50 mg/mL oral solution SOLN 125 mg  Status:  Discontinued        125 mg Oral Every 6 hours 12/28/22 1323 12/31/22 1243   12/28/22 1000  metroNIDAZOLE (FLAGYL) IVPB 500 mg  Status:  Discontinued        500 mg 100 mL/hr over 60 Minutes Intravenous Every 12 hours 12/28/22 0410 12/28/22 1426   12/27/22 2345  metroNIDAZOLE (FLAGYL) IVPB 500 mg        500 mg 100 mL/hr over 60 Minutes Intravenous  Once 12/27/22 2344 12/28/22 0246        Subjective  The patient is resting comfortably. No acute distress.  Objective   Vitals:  Vitals:   01/12/23 0805 01/12/23 1545  BP: (!) 103/52 114/66  Pulse: 81 (!) 104  Resp: 18 20  Temp: 98.2 F (36.8 C) 99.7 F (37.6 C)  SpO2: 100% 96%    Exam:  Constitutional:  The patient is awake, alert, and oriented x 3. No acute distress. Respiratory:  No increased work of breathing. No wheezes, rales, or rhonchi No tactile fremitus Cardiovascular:  Regular rate and rhythm No murmurs, ectopy, or gallups. No lateral PMI. No thrills. Abdomen:  Abdomen is soft, non-tender, non-distended No hernias, masses, or organomegaly Normoactive bowel sounds.  Musculoskeletal:  No cyanosis, clubbing, or edema Skin:  No rashes, lesions, ulcers palpation of skin: no induration or nodules Neurologic:  CN 2-12 intact Sensation all 4 extremities intact Psychiatric:  Mental status Mood, affect appropriate Orientation to person, place, time  judgment and insight appear intact   I have personally reviewed the following:   Today's Data   Vitals:   01/12/23 0805 01/12/23 1545  BP: (!) 103/52 114/66  Pulse: 81 (!) 104  Resp: 18 20  Temp: 98.2 F (36.8 C) 99.7 F (37.6 C)  SpO2: 100% 96%     Lab Data  CBC    Component Value Date/Time   WBC 8.2 01/09/2023 0603   RBC 3.37 (L) 01/09/2023 0603   HGB 9.2 (L) 01/09/2023 0603    HGB 13.9 10/02/2019 1007   HCT 29.4 (L) 01/09/2023 0603   HCT 41.7 10/02/2019 1007   PLT 285 01/09/2023 0603   PLT 250 10/02/2019 1007   MCV 87.2 01/09/2023 0603   MCV 88 10/02/2019 1007   MCH 27.3 01/09/2023 0603   MCHC 31.3 01/09/2023 0603   RDW 15.8 (H) 01/09/2023 0603   RDW 13.4 10/02/2019 1007   LYMPHSABS 1.1 01/09/2023 0603   LYMPHSABS 1.5 10/02/2019 1007   MONOABS 0.5 01/09/2023 0603   EOSABS 0.2 01/09/2023 0603   EOSABS 0.3 10/02/2019 1007   BASOSABS 0.0 01/09/2023 0603   BASOSABS 0.1 10/02/2019 1007      Latest Ref Rng & Units 01/12/2023    4:24 AM 01/11/2023    5:08 AM 01/10/2023    5:14 AM  BMP  Glucose 70 - 99 mg/dL 161  99  096   BUN 8 - 23 mg/dL 33  40  37   Creatinine 0.44 -  1.00 mg/dL 1.47  8.29  5.62   Sodium 135 - 145 mmol/L 135  138  137   Potassium 3.5 - 5.1 mmol/L 3.2  3.5  3.9   Chloride 98 - 111 mmol/L 96  98  99   CO2 22 - 32 mmol/L 30  31  29    Calcium 8.9 - 10.3 mg/dL 8.0  8.1  7.9      Micro Data   Results for orders placed or performed during the hospital encounter of 12/27/22  Resp panel by RT-PCR (RSV, Flu A&B, Covid) Anterior Nasal Swab     Status: Abnormal   Collection Time: 12/28/22 12:20 AM   Specimen: Anterior Nasal Swab  Result Value Ref Range Status   SARS Coronavirus 2 by RT PCR POSITIVE (A) NEGATIVE Final    Comment: (NOTE) SARS-CoV-2 target nucleic acids are DETECTED.  The SARS-CoV-2 RNA is generally detectable in upper respiratory specimens during the acute phase of infection. Positive results are indicative of the presence of the identified virus, but do not rule out bacterial infection or co-infection with other pathogens not detected by the test. Clinical correlation with patient history and other diagnostic information is necessary to determine patient infection status. The expected result is Negative.  Fact Sheet for Patients: BloggerCourse.com  Fact Sheet for Healthcare  Providers: SeriousBroker.it  This test is not yet approved or cleared by the [place] FDA and  has been authorized for detection and/or diagnosis of SARS-CoV-2 by FDA under an Emergency Use Authorization (EUA).  This EUA will remain in effect (meaning this test can be used) for the duration of  the COVID-19 declaration under Section 564(b)(1) of the A ct, 21 U.S.C. section 360bbb-3(b)(1), unless the authorization is terminated or revoked sooner.     Influenza A by PCR NEGATIVE NEGATIVE Final   Influenza B by PCR NEGATIVE NEGATIVE Final    Comment: (NOTE) The Xpert Xpress SARS-CoV-2/FLU/RSV plus assay is intended as an aid in the diagnosis of influenza from Nasopharyngeal swab specimens and should not be used as a sole basis for treatment. Nasal washings and aspirates are unacceptable for Xpert Xpress SARS-CoV-2/FLU/RSV testing.  Fact Sheet for Patients: BloggerCourse.com  Fact Sheet for Healthcare Providers: SeriousBroker.it  This test is not yet approved or cleared by the [place] FDA and has been authorized for detection and/or diagnosis of SARS-CoV-2 by FDA under an Emergency Use Authorization (EUA). This EUA will remain in effect (meaning this test can be used) for the duration of the COVID-19 declaration under Section 564(b)(1) of the Act, 21 U.S.C. section 360bbb-3(b)(1), unless the authorization is terminated or revoked.     Resp Syncytial Virus by PCR NEGATIVE NEGATIVE Final    Comment: (NOTE) Fact Sheet for Patients: BloggerCourse.com  Fact Sheet for Healthcare Providers: SeriousBroker.it  This test is not yet approved or cleared by the [place] FDA and has been authorized for detection and/or diagnosis of SARS-CoV-2 by FDA under an Emergency Use Authorization (EUA). This EUA will remain in effect (meaning this test can be  used) for the duration of the COVID-19 declaration under Section 564(b)(1) of the Act, 21 U.S.C. section 360bbb-3(b)(1), unless the authorization is terminated or revoked.  Performed at [facility], [address] [place], [place] [address]   Blood Culture (routine x 2)     Status: Abnormal   Collection Time: 12/28/22 12:20 AM   Specimen: BLOOD  Result Value Ref Range Status   Specimen Description   Final  BLOOD  LEFT AC Performed at [facility], [address] [place], [place] [address]    Special Requests   Final    BOTTLES DRAWN AEROBIC AND ANAEROBIC Blood Culture adequate volume Performed at [facility], [address] [place], [place] [address]    Culture  Setup Time   Final    GRAM POSITIVE COCCI ANAEROBIC BOTTLE ONLY Organism ID to follow CRITICAL RESULT CALLED TO, READ BACK BY AND VERIFIED WITH: NATHAN BELUE @ 2204 12/28/22 LFD Performed at [facility], [address] [place], [place] [address]    Culture (A)  Final    STAPHYLOCOCCUS EPIDERMIDIS THE SIGNIFICANCE OF ISOLATING THIS ORGANISM FROM A SINGLE SET OF BLOOD CULTURES WHEN MULTIPLE SETS ARE DRAWN IS UNCERTAIN. PLEASE NOTIFY THE MICROBIOLOGY DEPARTMENT WITHIN ONE WEEK IF SPECIATION AND SENSITIVITIES ARE REQUIRED. Performed at [facility], [address], [place], [place] [address]    Report Status 12/31/2022 FINAL  Final  Blood Culture ID Panel (Reflexed)     Status: Abnormal   Collection Time: 12/28/22 12:20 AM  Result Value Ref Range Status   Enterococcus faecalis NOT DETECTED NOT DETECTED Final   Enterococcus Faecium NOT DETECTED NOT DETECTED Final   Listeria monocytogenes NOT DETECTED NOT DETECTED Final   Staphylococcus species DETECTED (A) NOT DETECTED Final    Comment: CRITICAL RESULT CALLED TO, READ BACK BY AND VERIFIED WITH: NATHAN BELUE @ 2204 12/28/22 LFD    Staphylococcus aureus (BCID) NOT DETECTED NOT DETECTED Final   Staphylococcus  epidermidis DETECTED (A) NOT DETECTED Final    Comment: Methicillin (oxacillin) resistant coagulase negative staphylococcus. Possible blood culture contaminant (unless isolated from more than one blood culture draw or clinical case suggests pathogenicity). No antibiotic treatment is indicated for blood  culture contaminants. CRITICAL RESULT CALLED TO, READ BACK BY AND VERIFIED WITH: NATHAN BELUE @ 2204 12/28/22 LFD    Staphylococcus lugdunensis NOT DETECTED NOT DETECTED Final   Streptococcus species NOT DETECTED NOT DETECTED Final   Streptococcus agalactiae NOT DETECTED NOT DETECTED Final   Streptococcus pneumoniae NOT DETECTED NOT DETECTED Final   Streptococcus pyogenes NOT DETECTED NOT DETECTED Final   A.calcoaceticus-baumannii NOT DETECTED NOT DETECTED Final   Bacteroides fragilis NOT DETECTED NOT DETECTED Final   Enterobacterales NOT DETECTED NOT DETECTED Final   Enterobacter cloacae complex NOT DETECTED NOT DETECTED Final   Escherichia coli NOT DETECTED NOT DETECTED Final   Klebsiella aerogenes NOT DETECTED NOT DETECTED Final   Klebsiella oxytoca NOT DETECTED NOT DETECTED Final   Klebsiella pneumoniae NOT DETECTED NOT DETECTED Final   Proteus species NOT DETECTED NOT DETECTED Final   Salmonella species NOT DETECTED NOT DETECTED Final   Serratia marcescens NOT DETECTED NOT DETECTED Final   Haemophilus influenzae NOT DETECTED NOT DETECTED Final   Neisseria meningitidis NOT DETECTED NOT DETECTED Final   Pseudomonas aeruginosa NOT DETECTED NOT DETECTED Final   Stenotrophomonas maltophilia NOT DETECTED NOT DETECTED Final   Candida albicans NOT DETECTED NOT DETECTED Final   Candida auris NOT DETECTED NOT DETECTED Final   Candida glabrata NOT DETECTED NOT DETECTED Final   Candida krusei NOT DETECTED NOT DETECTED Final   Candida parapsilosis NOT DETECTED NOT DETECTED Final   Candida tropicalis NOT DETECTED NOT DETECTED Final   Cryptococcus neoformans/gattii NOT DETECTED NOT DETECTED  Final   Methicillin resistance mecA/C DETECTED (A) NOT DETECTED Final    Comment: CRITICAL RESULT CALLED TO, READ BACK BY AND VERIFIED WITH: NATHAN BELUE @ 2204 12/28/22 LFD Performed at [facility], [address] [place], [place] [address]   Blood Culture (routine x 2)     Status: None   Collection Time: 12/28/22  1:06 AM   Specimen: BLOOD  Result Value Ref Range Status   Specimen Description BLOOD RIGHT FOREARM  Final   Special Requests   Final    BOTTLES DRAWN AEROBIC AND ANAEROBIC Blood Culture results may not be optimal due to an inadequate volume of blood received in culture bottles   Culture   Final    NO GROWTH 5 DAYS Performed at [facility], [address] [place], [place] [address]    Report Status 01/02/2023 FINAL  Final  C Difficile Quick Screen w PCR reflex     Status: Abnormal   Collection Time: 12/29/22  1:01 PM   Specimen: STOOL  Result Value Ref Range Status   C Diff antigen POSITIVE (A) NEGATIVE Final   C Diff toxin POSITIVE (A) NEGATIVE Final   C Diff interpretation Toxin producing C. difficile detected.  Final    Comment: CRITICAL RESULT CALLED TO, READ BACK BY AND VERIFIED WITH: KATIE CLAYTON AT 1432 12/29/22.PMF Performed at [facility], [address] [place], [place] [address]   Urine Culture     Status: Abnormal   Collection Time: 12/30/22 11:43 PM   Specimen: Urine, Random  Result Value Ref Range Status   Specimen Description   Final    URINE, RANDOM Performed at [facility], [address] [place], [place] [address]    Special Requests   Final    NONE Reflexed from [id number] Performed at [facility], [address] [place], [place] [address]    Culture (A)  Final    >=100,000 COLONIES/mL KLEBSIELLA OXYTOCA 70,000 COLONIES/mL ENTEROCOCCUS FAECALIS Confirmed Extended Spectrum Beta-Lactamase Producer (ESBL).  In bloodstream infections from ESBL organisms, carbapenems are preferred over  piperacillin/tazobactam. They are shown to have a lower risk of mortality.    Report Status 01/02/2023 FINAL  Final   Organism ID, Bacteria KLEBSIELLA OXYTOCA (A)  Final   Organism ID, Bacteria ENTEROCOCCUS FAECALIS (A)  Final      Susceptibility   Enterococcus faecalis - MIC*    AMPICILLIN <=2 SENSITIVE Sensitive     NITROFURANTOIN <=16 SENSITIVE Sensitive     VANCOMYCIN 1 SENSITIVE Sensitive     * 70,000 COLONIES/mL ENTEROCOCCUS FAECALIS   Klebsiella oxytoca - MIC*    AMPICILLIN >=32 RESISTANT Resistant     CEFEPIME >=32 RESISTANT Resistant     CEFTRIAXONE >=64 RESISTANT Resistant     CIPROFLOXACIN 1 RESISTANT Resistant     GENTAMICIN >=16 RESISTANT Resistant     IMIPENEM <=0.25 SENSITIVE Sensitive     NITROFURANTOIN 32 SENSITIVE Sensitive     TRIMETH/SULFA >=320 RESISTANT Resistant     AMPICILLIN/SULBACTAM >=32 RESISTANT Resistant     PIP/TAZO <=4 SENSITIVE Sensitive     * >=100,000 COLONIES/mL KLEBSIELLA OXYTOCA  MRSA Next Gen by PCR, Nasal     Status: None   Collection Time: 12/31/22 12:46 AM   Specimen: Nasal Mucosa; Nasal Swab  Result Value Ref Range Status   MRSA by PCR Next Gen NOT DETECTED NOT DETECTED Final    Comment: (NOTE) The GeneXpert MRSA Assay (FDA approved for NASAL specimens only), is one component of a comprehensive MRSA colonization surveillance program. It is not intended to diagnose MRSA infection nor to guide or monitor treatment for MRSA infections. Test performance is not FDA approved in patients less than 2  years old. Performed at [facility], [address] [place], [place] [address]     Scheduled Meds:  (feeding supplement) PROSource Plus  30 mL Oral TID BM   vitamin C  500 mg Oral BID   cholecalciferol  1,000 Units Oral Daily   enoxaparin (LOVENOX) injection  40 mg Subcutaneous Q24H   furosemide  40 mg Intravenous Daily   gabapentin  100 mg Oral QHS   Gerhardt's butt cream   Topical Daily   midodrine  5 mg Oral TID WC    multivitamin with minerals  1 tablet Oral Daily   nutrition supplement (JUVEN)  1 packet Oral BID BM   nystatin   Topical TID   vancomycin  125 mg Oral QID   Followed by   vancomycin  125 mg Oral BID   Followed by   Melene Muller ON 01/20/2023] vancomycin  125 mg Oral Daily   Followed by   Melene Muller ON 01/27/2023] vancomycin  125 mg Oral QODAY   Followed by   Melene Muller ON 02/04/2023] vancomycin  125 mg Oral Q3 days   vitamin A  10,000 Units Oral Daily   zinc sulfate  220 mg Oral Daily   Continuous Infusions:  sodium chloride Stopped (01/12/23 0600)    Principal Problem:   Pancolitis (HCC) Active Problems:   Clostridium difficile diarrhea   COVID-19 virus infection   AKI (acute kidney injury) (HCC)   History of Clostridioides difficile colitis   Hypotension   CKD stage 3b, GFR 30-44 ml/min (HCC)   Hypomagnesemia   Hypokalemia   LOS: 15 days

## 2023-01-12 NOTE — Progress Notes (Signed)
Physical Therapy Treatment Patient Details Name: Jeanne [name] MRN: [id number] DOB: 07-Jul-1951 Today's Date: 01/12/2023   History of Present Illness 72 y/o female recently hospitalized with C.Diff from 11/09/22-11/12/22, discharged with PO vancomycin. She states she completed this antibiotic course as prescribed. She reported that she did have resolution of her symptoms for a few weeks. She also had been COVID 19 + during this admission, but was asymptomatic and did not require treatment.  She reports that over the past week she has been nauseous and had poor PO intake, but denied vomiting. Over the last 4 days she has had multiple bouts of non-bloody diarrhea and bilateral lower quadrant abdominal pain/cramping. She reported these episodes seemed exactly the same in consistency and smell as during her previous C.Diff infection. She also reports generalized fatigue.    PT Comments    Pt was long sitting in bed with supportive daughter and spouse in room. She endorses feeling terrible and recent bout of diarrhea. Pt states," I know I need to do something but I'm not getting OOB/EOB."  Pt does fully participate in AROM/AAROM exercises while in bed. Author issue theraband for UE strengthening as well. Author also requested PRAFO boot to prevent ankle contractures with education to only wear at night or when not performing AROM. PRAFOs are not to be worn in wt bearing positions. Pt is severely weak and deconditioned. Complicated hospital course + pt's self limiting has greatly slowed progress towards PT goals. Pt will require extensive skilled PT going forward to maximize independence and safety while decreasing caregiver burden.      Recommendations for follow up therapy are one component of a multi-disciplinary discharge planning process, led by the attending physician.  Recommendations may be updated based on patient status, additional functional criteria and insurance authorization.     Assistance  Recommended at Discharge Frequent or constant Supervision/Assistance  Patient can return home with the following Two people to help with walking and/or transfers;A lot of help with bathing/dressing/bathroom;Assistance with cooking/housework;Assist for transportation;Help with stairs or ramp for entrance   Equipment Recommendations  Other (comment) (Defer to next level of care)       Precautions / Restrictions Precautions Precautions: Fall Restrictions Weight Bearing Restrictions: Yes RLE Weight Bearing: Partial weight bearing RLE Partial Weight Bearing Percentage or Pounds: 20% per pt report Other Position/Activity Restrictions: Pt reports PWB s/p THA in October.     Mobility  Bed Mobility  General bed mobility comments: Pt unwilling due to feeling so poorly. She does fully participate in exercises in bed. see exrcises listed below.         Cognition Arousal/Alertness: Awake/alert Behavior During Therapy: Flat affect Overall Cognitive Status: Within Functional Limits for tasks assessed  General Comments: remains self limiting and not willing to attempt sitting EOB. nDid fully participate in AAROM exercises. Issued theraband for UE strengthening        Exercises General Exercises - Lower Extremity Ankle Circles/Pumps: AROM, Strengthening, Both, 15 reps, Seated Quad Sets: AROM, Right, Left, Strengthening, Both, 10 reps, Supine Gluteal Sets: AROM, Right, Left, Strengthening, Both, 10 reps, Supine Heel Slides: AAROM, Both, 10 reps, Strengthening, Seated Hip ABduction/ADduction: AROM, Left, AAROM, Right Straight Leg Raises: AAROM, 10 reps    General Comments General comments (skin integrity, edema, etc.): Requested PRAFO boot to prevent contractures. Dr Gerri Lins agreeable to order but had hard time placing order.      Pertinent Vitals/Pain Pain Assessment Pain Assessment: 0-10 Pain Score: 9  Faces Pain Scale: Hurts whole  lot Pain Location: whole body Pain Descriptors /  Indicators: Discomfort Pain Intervention(s): Limited activity within patient's tolerance, Monitored during session, Repositioned, Premedicated before session     PT Goals (current goals can now be found in the care plan section) Acute Rehab PT Goals Patient Stated Goal: "I just want to feel better so I can get going with my therapy." Progress towards PT goals: Not progressing toward goals - comment (pt has been limited by pain, illness and overall weakness)    Frequency    Min 2X/week      PT Plan Current plan remains appropriate       AM-PAC PT "6 Clicks" Mobility   Outcome Measure  Help needed turning from your back to your side while in a flat bed without using bedrails?: Total Help needed moving from lying on your back to sitting on the side of a flat bed without using bedrails?: Total Help needed moving to and from a bed to a chair (including a wheelchair)?: Total Help needed standing up from a chair using your arms (e.g., wheelchair or bedside chair)?: Total Help needed to walk in hospital room?: Total Help needed climbing 3-5 steps with a railing? : Total 6 Click Score: 6    End of Session   Activity Tolerance: Patient limited by pain;Other (comment) (self limiting) Patient left: in bed;with call bell/phone within reach;with bed alarm set;with family/visitor present Nurse Communication: Mobility status PT Visit Diagnosis: Difficulty in walking, not elsewhere classified (R26.2);Muscle weakness (generalized) (M62.81);Pain Pain - Right/Left: Right Pain - part of body: Hip     Time: 1610-9604 PT Time Calculation (min) (ACUTE ONLY): 24 min  Charges:  $Therapeutic Exercise: 23-37 mins                     Jetta Lout PTA 01/12/23, 5:18 PM

## 2023-01-13 DIAGNOSIS — K5100 Ulcerative (chronic) pancolitis without complications: Secondary | ICD-10-CM | POA: Diagnosis not present

## 2023-01-13 LAB — BASIC METABOLIC PANEL
Anion gap: 9 (ref 5–15)
BUN: 34 mg/dL — ABNORMAL HIGH (ref 8–23)
CO2: 30 mmol/L (ref 22–32)
Calcium: 8 mg/dL — ABNORMAL LOW (ref 8.9–10.3)
Chloride: 96 mmol/L — ABNORMAL LOW (ref 98–111)
Creatinine, Ser: 1.15 mg/dL — ABNORMAL HIGH (ref 0.44–1.00)
GFR, Estimated: 51 mL/min — ABNORMAL LOW (ref >60)
Glucose, Bld: 104 mg/dL — ABNORMAL HIGH (ref 70–99)
Potassium: 3.6 mmol/L (ref 3.5–5.1)
Sodium: 135 mmol/L (ref 135–145)

## 2023-01-13 LAB — CBC WITH DIFFERENTIAL/PLATELET
Abs Immature Granulocytes: 0.06 10*3/uL (ref 0.00–0.07)
Basophils Absolute: 0 10*3/uL (ref 0.0–0.1)
Basophils Relative: 0 %
Eosinophils Absolute: 0.2 10*3/uL (ref 0.0–0.5)
Eosinophils Relative: 2 %
HCT: 29 % — ABNORMAL LOW (ref 36.0–46.0)
Hemoglobin: 8.8 g/dL — ABNORMAL LOW (ref 12.0–15.0)
Immature Granulocytes: 1 %
Lymphocytes Relative: 15 %
Lymphs Abs: 1.5 10*3/uL (ref 0.7–4.0)
MCH: 27.2 pg (ref 26.0–34.0)
MCHC: 30.3 g/dL (ref 30.0–36.0)
MCV: 89.5 fL (ref 80.0–100.0)
Monocytes Absolute: 0.9 10*3/uL (ref 0.1–1.0)
Monocytes Relative: 9 %
Neutro Abs: 6.8 10*3/uL (ref 1.7–7.7)
Neutrophils Relative %: 73 %
Platelets: 284 10*3/uL (ref 150–400)
RBC: 3.24 MIL/uL — ABNORMAL LOW (ref 3.87–5.11)
RDW: 15.9 % — ABNORMAL HIGH (ref 11.5–15.5)
WBC: 9.5 10*3/uL (ref 4.0–10.5)
nRBC: 0 % (ref 0.0–0.2)

## 2023-01-13 LAB — GLUCOSE, CAPILLARY: Glucose-Capillary: 98 mg/dL (ref 70–99)

## 2023-01-13 MED ORDER — OXYCODONE HCL 5 MG PO TABS
5.0000 mg | ORAL_TABLET | ORAL | Status: DC | PRN
  Administered 2023-01-13 – 2023-01-14 (×3): 10 mg via ORAL
  Administered 2023-01-14 – 2023-01-15 (×4): 5 mg via ORAL
  Administered 2023-01-16 (×3): 10 mg via ORAL
  Administered 2023-01-17 (×2): 5 mg via ORAL
  Administered 2023-01-18: 10 mg via ORAL
  Administered 2023-01-18: 5 mg via ORAL
  Administered 2023-01-18 – 2023-01-19 (×2): 10 mg via ORAL
  Filled 2023-01-13: qty 2
  Filled 2023-01-13: qty 1
  Filled 2023-01-13 (×3): qty 2
  Filled 2023-01-13: qty 1
  Filled 2023-01-13 (×2): qty 2
  Filled 2023-01-13 (×2): qty 1
  Filled 2023-01-13 (×2): qty 2
  Filled 2023-01-13 (×2): qty 1
  Filled 2023-01-13: qty 2
  Filled 2023-01-13: qty 1

## 2023-01-13 MED ORDER — ACETAMINOPHEN 325 MG PO TABS
650.0000 mg | ORAL_TABLET | Freq: Four times a day (QID) | ORAL | Status: DC | PRN
  Filled 2023-01-13: qty 2

## 2023-01-13 MED ORDER — DIPHENHYDRAMINE HCL 50 MG/ML IJ SOLN
25.0000 mg | Freq: Three times a day (TID) | INTRAMUSCULAR | Status: DC | PRN
  Administered 2023-01-13 – 2023-01-18 (×5): 25 mg via INTRAVENOUS
  Filled 2023-01-13 (×6): qty 1

## 2023-01-13 NOTE — Progress Notes (Signed)
PROGRESS NOTE  Jeanne [name] WUJ:[id number] DOB: Nov 12, 1950 DOA: 12/27/2022 PCP: Jerl Mina, MD  Brief History   PMH of rheumatoid arthritis, cervical cancer, HTN, CKD 3B presents to the hospital with complaints of diarrhea was found to have pancolitis with septic shock secondary to C. difficile infection. She never required IV vasopressors but was treated with IV fluid and IV albumin. Currently on midodrine. ID was added for C. difficile infection. Concerning for recurrent infection currently pulse therapy with vancomycin. Also had AKI nephrology following.   The patient has a rectal tube for C Diff colitis. She began having copious liquid stool again last night that caused the tube to come loose.   She states that she is feeling better today, but she is not yet ready to have tube removed.   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 Presents with abdominal pain and diarrhea. Found to have pancolitis on CT abdomen. Recently treated for CAD prior to admission. Due to ongoing diarrhea ID was consulted with regards to concern for C. difficile reoccurrence. Currently on pulse dose vancomycin therapy. Unable to afford Dificid outside of the hospital. Has needed rectal tube. Possibly may remove tomorrow. Semi solid stool in tube today.  No benefit from low-dose Questran.  And had some itching after that. Was treated with IV fluid as well as IV albumin.  Never required IV pressors.  Also received IV hydrocortisone.  Currently on midodrine therapy. Blood pressure is significantly better.  Will monitor.   Acute kidney injury on CKD 3B. Anasarca. Hypoalbuminemia. Non-anion gap metabolic acidosis. Nephrology following. Receiving IV Lasix as well as bicarb therapy.  Will provide IV albumin 1 more time on 5/4 before IV Lasix. Ultrasound shows evidence of mild bilateral hydronephrosis. Patient was on diclofenac which was discontinued due to her AKI. Lower extremity  Doppler negative for any acute DVT but does show age-indeterminate nonocclusive SVT.   Acute HFpEF. Echocardiogram shows EF of 60/65%. No wall motion abnormality. Currently receiving diuretic therapy.   Rheumatoid arthritis. Not on any therapy. Oxycodone as needed for pain control. Diclofenac was discontinued due to AKI.   History of right total hip arthroplasty. Ongoing pain issues. Gets her care at [facility]. Outpatient follow-up with Dr. Leontine Locket after discharge from the hospital.    Bilateral hydronephrosis with nephrolithiasis. Outpatient follow-up with urology recommended.   Normocytic anemia. Likely from poor p.o. intake. Monitor.   Hypomagnesemia.  Hypokalemia. Supplemented.   Itching. Suspected developed after Questran. No rash.  Atarax..  I have seen and examined this patient myself. I have spent 34 minutes in his evaluation and care.  DVT prophylaxis: Lovenox Code Status: Full Code Family Communication: None available Disposition Plan: Home with home health.    Arlana Canizales, DO [facility] Direct contact: see [email or web address]  7PM-7AM contact night coverage as above 01/13/2023, 5:26 PM  LOS: 15 days   Consultants  None  Procedures  None  Antibiotics   Anti-infectives (From admission, onward)    Start     Dose/Rate Route Frequency Ordered Stop   02/04/23 1000  vancomycin (VANCOCIN) capsule 125 mg       See Hyperspace for full Linked Orders Report.   125 mg Oral Every 3 DAYS 12/31/22 1243 02/13/23 0959   01/27/23 1000  vancomycin (VANCOCIN) capsule 125 mg       See Hyperspace for full Linked Orders Report.   125 mg Oral Every other day 12/31/22 1243 02/04/23 0959   01/20/23 1000  vancomycin (VANCOCIN) capsule 125 mg       See Hyperspace for full Linked Orders Report.   125 mg Oral Daily 12/31/22 1243 01/27/23 0959   01/12/23 2200  vancomycin (VANCOCIN) capsule 125 mg       See Hyperspace for full Linked Orders Report.   125  mg Oral 2 times daily 12/31/22 1243 01/19/23 2159   12/31/22 1800  vancomycin (VANCOCIN) capsule 125 mg       See Hyperspace for full Linked Orders Report.   125 mg Oral 4 times daily 12/31/22 1243 01/12/23 1759   12/28/22 1415  vancomycin (VANCOCIN) 50 mg/mL oral solution SOLN 125 mg  Status:  Discontinued        125 mg Oral Every 6 hours 12/28/22 1323 12/31/22 1243   12/28/22 1000  metroNIDAZOLE (FLAGYL) IVPB 500 mg  Status:  Discontinued        500 mg 100 mL/hr over 60 Minutes Intravenous Every 12 hours 12/28/22 0410 12/28/22 1426   12/27/22 2345  metroNIDAZOLE (FLAGYL) IVPB 500 mg        500 mg 100 mL/hr over 60 Minutes Intravenous  Once 12/27/22 2344 12/28/22 0246        Subjective  The patient is resting comfortably. No acute distress.  Objective   Vitals:  Vitals:   01/13/23 1000 01/13/23 1515  BP: 134/87 (!) 98/57  Pulse: 68 91  Resp: 18 18  Temp: 98.2 F (36.8 C) 99.6 F (37.6 C)  SpO2: 98% 100%     Exam:  Constitutional:  The patient is awake, alert, and oriented x 3. No acute distress. Respiratory:  No increased work of breathing. No wheezes, rales, or rhonchi No tactile fremitus Cardiovascular:  Regular rate and rhythm No murmurs, ectopy, or gallups. No lateral PMI. No thrills. Abdomen:  Abdomen is soft, non-tender, non-distended No hernias, masses, or organomegaly Normoactive bowel sounds.  Musculoskeletal:  No cyanosis, clubbing, or edema Skin:  No rashes, lesions, ulcers palpation of skin: no induration or nodules Neurologic:  CN 2-12 intact Sensation all 4 extremities intact Psychiatric:  Mental status Mood, affect appropriate Orientation to person, place, time  judgment and insight appear intact   I have personally reviewed the following:   Today's Data   Vitals:   01/13/23 1000 01/13/23 1515  BP: 134/87 (!) 98/57  Pulse: 68 91  Resp: 18 18  Temp: 98.2 F (36.8 C) 99.6 F (37.6 C)  SpO2: 98% 100%     Lab Data  CBC     Component Value Date/Time   WBC 9.5 01/13/2023 0443   RBC 3.24 (L) 01/13/2023 0443   HGB 8.8 (L) 01/13/2023 0443   HGB 13.9 10/02/2019 1007   HCT 29.0 (L) 01/13/2023 0443   HCT 41.7 10/02/2019 1007   PLT 284 01/13/2023 0443   PLT 250 10/02/2019 1007   MCV 89.5 01/13/2023 0443   MCV 88 10/02/2019 1007   MCH 27.2 01/13/2023 0443   MCHC 30.3 01/13/2023 0443   RDW 15.9 (H) 01/13/2023 0443   RDW 13.4 10/02/2019 1007   LYMPHSABS 1.5 01/13/2023 0443   LYMPHSABS 1.5 10/02/2019 1007   MONOABS 0.9 01/13/2023 0443   EOSABS 0.2 01/13/2023 0443   EOSABS 0.3 10/02/2019 1007   BASOSABS 0.0 01/13/2023 0443   BASOSABS 0.1 10/02/2019 1007       Latest Ref Rng & Units 01/13/2023    4:43 AM 01/12/2023    4:24 AM 01/11/2023    5:08 AM  BMP  Glucose 70 -  99 mg/dL 811  914  99   BUN 8 - 23 mg/dL 34  33  40   Creatinine 0.44 - 1.00 mg/dL 7.82  9.56  2.13   Sodium 135 - 145 mmol/L 135  135  138   Potassium 3.5 - 5.1 mmol/L 3.6  3.2  3.5   Chloride 98 - 111 mmol/L 96  96  98   CO2 22 - 32 mmol/L 30  30  31    Calcium 8.9 - 10.3 mg/dL 8.0  8.0  8.1    Micro Data   Results for orders placed or performed during the hospital encounter of 12/27/22  Resp panel by RT-PCR (RSV, Flu A&B, Covid) Anterior Nasal Swab     Status: Abnormal   Collection Time: 12/28/22 12:20 AM   Specimen: Anterior Nasal Swab  Result Value Ref Range Status   SARS Coronavirus 2 by RT PCR POSITIVE (A) NEGATIVE Final    Comment: (NOTE) SARS-CoV-2 target nucleic acids are DETECTED.  The SARS-CoV-2 RNA is generally detectable in upper respiratory specimens during the acute phase of infection. Positive results are indicative of the presence of the identified virus, but do not rule out bacterial infection or co-infection with other pathogens not detected by the test. Clinical correlation with patient history and other diagnostic information is necessary to determine patient infection status. The expected result is  Negative.  Fact Sheet for Patients: BloggerCourse.com  Fact Sheet for Healthcare Providers: SeriousBroker.it  This test is not yet approved or cleared by the [place] FDA and  has been authorized for detection and/or diagnosis of SARS-CoV-2 by FDA under an Emergency Use Authorization (EUA).  This EUA will remain in effect (meaning this test can be used) for the duration of  the COVID-19 declaration under Section 564(b)(1) of the A ct, 21 U.S.C. section 360bbb-3(b)(1), unless the authorization is terminated or revoked sooner.     Influenza A by PCR NEGATIVE NEGATIVE Final   Influenza B by PCR NEGATIVE NEGATIVE Final    Comment: (NOTE) The Xpert Xpress SARS-CoV-2/FLU/RSV plus assay is intended as an aid in the diagnosis of influenza from Nasopharyngeal swab specimens and should not be used as a sole basis for treatment. Nasal washings and aspirates are unacceptable for Xpert Xpress SARS-CoV-2/FLU/RSV testing.  Fact Sheet for Patients: BloggerCourse.com  Fact Sheet for Healthcare Providers: SeriousBroker.it  This test is not yet approved or cleared by the [place] FDA and has been authorized for detection and/or diagnosis of SARS-CoV-2 by FDA under an Emergency Use Authorization (EUA). This EUA will remain in effect (meaning this test can be used) for the duration of the COVID-19 declaration under Section 564(b)(1) of the Act, 21 U.S.C. section 360bbb-3(b)(1), unless the authorization is terminated or revoked.     Resp Syncytial Virus by PCR NEGATIVE NEGATIVE Final    Comment: (NOTE) Fact Sheet for Patients: BloggerCourse.com  Fact Sheet for Healthcare Providers: SeriousBroker.it  This test is not yet approved or cleared by the [place] FDA and has been authorized for detection and/or diagnosis of SARS-CoV-2  by FDA under an Emergency Use Authorization (EUA). This EUA will remain in effect (meaning this test can be used) for the duration of the COVID-19 declaration under Section 564(b)(1) of the Act, 21 U.S.C. section 360bbb-3(b)(1), unless the authorization is terminated or revoked.  Performed at [facility], [address], [place], [place] [address]   Blood Culture (routine x 2)     Status: Abnormal   Collection Time: 12/28/22  12:20 AM   Specimen: BLOOD  Result Value Ref Range Status   Specimen Description   Final    BLOOD  LEFT AC Performed at [facility], [address] [place], [place] [address]    Special Requests   Final    BOTTLES DRAWN AEROBIC AND ANAEROBIC Blood Culture adequate volume Performed at [facility], [address] [place], [place] [address]    Culture  Setup Time   Final    GRAM POSITIVE COCCI ANAEROBIC BOTTLE ONLY Organism ID to follow CRITICAL RESULT CALLED TO, READ BACK BY AND VERIFIED WITH: NATHAN BELUE @ 2204 12/28/22 LFD Performed at [facility], [address] [place], [place] [address]    Culture (A)  Final    STAPHYLOCOCCUS EPIDERMIDIS THE SIGNIFICANCE OF ISOLATING THIS ORGANISM FROM A SINGLE SET OF BLOOD CULTURES WHEN MULTIPLE SETS ARE DRAWN IS UNCERTAIN. PLEASE NOTIFY THE MICROBIOLOGY DEPARTMENT WITHIN ONE WEEK IF SPECIATION AND SENSITIVITIES ARE REQUIRED. Performed at [facility], [address] [place], [place] [address]    Report Status 12/31/2022 FINAL  Final  Blood Culture ID Panel (Reflexed)     Status: Abnormal   Collection Time: 12/28/22 12:20 AM  Result Value Ref Range Status   Enterococcus faecalis NOT DETECTED NOT DETECTED Final   Enterococcus Faecium NOT DETECTED NOT DETECTED Final   Listeria monocytogenes NOT DETECTED NOT DETECTED Final   Staphylococcus species DETECTED (A) NOT DETECTED Final    Comment: CRITICAL RESULT CALLED TO, READ BACK BY AND VERIFIED WITH: NATHAN BELUE @  2204 12/28/22 LFD    Staphylococcus aureus (BCID) NOT DETECTED NOT DETECTED Final   Staphylococcus epidermidis DETECTED (A) NOT DETECTED Final    Comment: Methicillin (oxacillin) resistant coagulase negative staphylococcus. Possible blood culture contaminant (unless isolated from more than one blood culture draw or clinical case suggests pathogenicity). No antibiotic treatment is indicated for blood  culture contaminants. CRITICAL RESULT CALLED TO, READ BACK BY AND VERIFIED WITH: NATHAN BELUE @ 2204 12/28/22 LFD    Staphylococcus lugdunensis NOT DETECTED NOT DETECTED Final   Streptococcus species NOT DETECTED NOT DETECTED Final   Streptococcus agalactiae NOT DETECTED NOT DETECTED Final   Streptococcus pneumoniae NOT DETECTED NOT DETECTED Final   Streptococcus pyogenes NOT DETECTED NOT DETECTED Final   A.calcoaceticus-baumannii NOT DETECTED NOT DETECTED Final   Bacteroides fragilis NOT DETECTED NOT DETECTED Final   Enterobacterales NOT DETECTED NOT DETECTED Final   Enterobacter cloacae complex NOT DETECTED NOT DETECTED Final   Escherichia coli NOT DETECTED NOT DETECTED Final   Klebsiella aerogenes NOT DETECTED NOT DETECTED Final   Klebsiella oxytoca NOT DETECTED NOT DETECTED Final   Klebsiella pneumoniae NOT DETECTED NOT DETECTED Final   Proteus species NOT DETECTED NOT DETECTED Final   Salmonella species NOT DETECTED NOT DETECTED Final   Serratia marcescens NOT DETECTED NOT DETECTED Final   Haemophilus influenzae NOT DETECTED NOT DETECTED Final   Neisseria meningitidis NOT DETECTED NOT DETECTED Final   Pseudomonas aeruginosa NOT DETECTED NOT DETECTED Final   Stenotrophomonas maltophilia NOT DETECTED NOT DETECTED Final   Candida albicans NOT DETECTED NOT DETECTED Final   Candida auris NOT DETECTED NOT DETECTED Final   Candida glabrata NOT DETECTED NOT DETECTED Final   Candida krusei NOT DETECTED NOT DETECTED Final   Candida parapsilosis NOT DETECTED NOT DETECTED Final   Candida  tropicalis NOT DETECTED NOT DETECTED Final   Cryptococcus neoformans/gattii NOT DETECTED NOT DETECTED Final   Methicillin resistance mecA/C DETECTED (A) NOT DETECTED Final  Comment: CRITICAL RESULT CALLED TO, READ BACK BY AND VERIFIED WITH: NATHAN BELUE @ 2204 12/28/22 LFD Performed at [facility], [address] [place], [place] [address]   Blood Culture (routine x 2)     Status: None   Collection Time: 12/28/22  1:06 AM   Specimen: BLOOD  Result Value Ref Range Status   Specimen Description BLOOD RIGHT FOREARM  Final   Special Requests   Final    BOTTLES DRAWN AEROBIC AND ANAEROBIC Blood Culture results may not be optimal due to an inadequate volume of blood received in culture bottles   Culture   Final    NO GROWTH 5 DAYS Performed at [facility], [address], [place], [place] [address]    Report Status 01/02/2023 FINAL  Final  C Difficile Quick Screen w PCR reflex     Status: Abnormal   Collection Time: 12/29/22  1:01 PM   Specimen: STOOL  Result Value Ref Range Status   C Diff antigen POSITIVE (A) NEGATIVE Final   C Diff toxin POSITIVE (A) NEGATIVE Final   C Diff interpretation Toxin producing C. difficile detected.  Final    Comment: CRITICAL RESULT CALLED TO, READ BACK BY AND VERIFIED WITH: KATIE CLAYTON AT 1432 12/29/22.PMF Performed at [facility], [address], [place], [place] [address]   Urine Culture     Status: Abnormal   Collection Time: 12/30/22 11:43 PM   Specimen: Urine, Random  Result Value Ref Range Status   Specimen Description   Final    URINE, RANDOM Performed at [facility], [address] [place], [place] [address]    Special Requests   Final    NONE Reflexed from [id number] Performed at [facility], [address] [place], [place] [address]    Culture (A)  Final    >=100,000 COLONIES/mL KLEBSIELLA OXYTOCA 70,000 COLONIES/mL ENTEROCOCCUS FAECALIS Confirmed Extended Spectrum Beta-Lactamase  Producer (ESBL).  In bloodstream infections from ESBL organisms, carbapenems are preferred over piperacillin/tazobactam. They are shown to have a lower risk of mortality.    Report Status 01/02/2023 FINAL  Final   Organism ID, Bacteria KLEBSIELLA OXYTOCA (A)  Final   Organism ID, Bacteria ENTEROCOCCUS FAECALIS (A)  Final      Susceptibility   Enterococcus faecalis - MIC*    AMPICILLIN <=2 SENSITIVE Sensitive     NITROFURANTOIN <=16 SENSITIVE Sensitive     VANCOMYCIN 1 SENSITIVE Sensitive     * 70,000 COLONIES/mL ENTEROCOCCUS FAECALIS   Klebsiella oxytoca - MIC*    AMPICILLIN >=32 RESISTANT Resistant     CEFEPIME >=32 RESISTANT Resistant     CEFTRIAXONE >=64 RESISTANT Resistant     CIPROFLOXACIN 1 RESISTANT Resistant     GENTAMICIN >=16 RESISTANT Resistant     IMIPENEM <=0.25 SENSITIVE Sensitive     NITROFURANTOIN 32 SENSITIVE Sensitive     TRIMETH/SULFA >=320 RESISTANT Resistant     AMPICILLIN/SULBACTAM >=32 RESISTANT Resistant     PIP/TAZO <=4 SENSITIVE Sensitive     * >=100,000 COLONIES/mL KLEBSIELLA OXYTOCA  MRSA Next Gen by PCR, Nasal     Status: None   Collection Time: 12/31/22 12:46 AM   Specimen: Nasal Mucosa; Nasal Swab  Result Value Ref Range Status   MRSA by PCR Next Gen NOT DETECTED NOT DETECTED Final    Comment: (NOTE) The GeneXpert MRSA Assay (FDA approved for NASAL specimens only), is one component of a comprehensive MRSA colonization surveillance program. It is not intended to diagnose MRSA infection  nor to guide or monitor treatment for MRSA infections. Test performance is not FDA approved in patients less than 45 years old. Performed at [facility], [address] [place], [place] [address]     Scheduled Meds:  (feeding supplement) PROSource Plus  30 mL Oral TID BM   vitamin C  500 mg Oral BID   cholecalciferol  1,000 Units Oral Daily   enoxaparin (LOVENOX) injection  40 mg Subcutaneous Q24H   furosemide  40 mg Intravenous Daily   gabapentin   100 mg Oral QHS   Gerhardt's butt cream   Topical Daily   midodrine  5 mg Oral TID WC   multivitamin with minerals  1 tablet Oral Daily   nutrition supplement (JUVEN)  1 packet Oral BID BM   nystatin   Topical TID   vancomycin  125 mg Oral QID   Followed by   vancomycin  125 mg Oral BID   Followed by   Melene Muller ON 01/20/2023] vancomycin  125 mg Oral Daily   Followed by   Melene Muller ON 01/27/2023] vancomycin  125 mg Oral QODAY   Followed by   Melene Muller ON 02/04/2023] vancomycin  125 mg Oral Q3 days   vitamin A  10,000 Units Oral Daily   zinc sulfate  220 mg Oral Daily   Continuous Infusions:  sodium chloride Stopped (01/12/23 0600)    Principal Problem:   Pancolitis (HCC) Active Problems:   Clostridium difficile diarrhea   COVID-19 virus infection   AKI (acute kidney injury) (HCC)   History of Clostridioides difficile colitis   Hypotension   CKD stage 3b, GFR 30-44 ml/min (HCC)   Hypomagnesemia   Hypokalemia   LOS: 15 days

## 2023-01-14 DIAGNOSIS — K5100 Ulcerative (chronic) pancolitis without complications: Secondary | ICD-10-CM | POA: Diagnosis not present

## 2023-01-14 LAB — CBC WITH DIFFERENTIAL/PLATELET
Abs Immature Granulocytes: 0.05 10*3/uL (ref 0.00–0.07)
Basophils Absolute: 0 10*3/uL (ref 0.0–0.1)
Basophils Relative: 0 %
Eosinophils Absolute: 0.3 10*3/uL (ref 0.0–0.5)
Eosinophils Relative: 3 %
HCT: 29.3 % — ABNORMAL LOW (ref 36.0–46.0)
Hemoglobin: 9.1 g/dL — ABNORMAL LOW (ref 12.0–15.0)
Immature Granulocytes: 1 %
Lymphocytes Relative: 16 %
Lymphs Abs: 1.2 10*3/uL (ref 0.7–4.0)
MCH: 27.7 pg (ref 26.0–34.0)
MCHC: 31.1 g/dL (ref 30.0–36.0)
MCV: 89.3 fL (ref 80.0–100.0)
Monocytes Absolute: 0.6 10*3/uL (ref 0.1–1.0)
Monocytes Relative: 7 %
Neutro Abs: 5.6 10*3/uL (ref 1.7–7.7)
Neutrophils Relative %: 73 %
Platelets: 274 10*3/uL (ref 150–400)
RBC: 3.28 MIL/uL — ABNORMAL LOW (ref 3.87–5.11)
RDW: 15.9 % — ABNORMAL HIGH (ref 11.5–15.5)
WBC: 7.7 10*3/uL (ref 4.0–10.5)
nRBC: 0 % (ref 0.0–0.2)

## 2023-01-14 LAB — BASIC METABOLIC PANEL
Anion gap: 11 (ref 5–15)
BUN: 36 mg/dL — ABNORMAL HIGH (ref 8–23)
CO2: 32 mmol/L (ref 22–32)
Calcium: 8.3 mg/dL — ABNORMAL LOW (ref 8.9–10.3)
Chloride: 92 mmol/L — ABNORMAL LOW (ref 98–111)
Creatinine, Ser: 1.26 mg/dL — ABNORMAL HIGH (ref 0.44–1.00)
GFR, Estimated: 46 mL/min — ABNORMAL LOW (ref >60)
Glucose, Bld: 105 mg/dL — ABNORMAL HIGH (ref 70–99)
Potassium: 3.4 mmol/L — ABNORMAL LOW (ref 3.5–5.1)
Sodium: 135 mmol/L (ref 135–145)

## 2023-01-14 LAB — GLUCOSE, CAPILLARY: Glucose-Capillary: 92 mg/dL (ref 70–99)

## 2023-01-14 LAB — MAGNESIUM: Magnesium: 1.9 mg/dL (ref 1.7–2.4)

## 2023-01-14 LAB — PHOSPHORUS: Phosphorus: 3.3 mg/dL (ref 2.5–4.6)

## 2023-01-14 MED ORDER — ALBUMIN HUMAN 25 % IV SOLN
12.5000 g | Freq: Four times a day (QID) | INTRAVENOUS | Status: AC
  Administered 2023-01-14 (×3): 12.5 g via INTRAVENOUS
  Filled 2023-01-14 (×3): qty 50

## 2023-01-14 MED ORDER — SACCHAROMYCES BOULARDII 250 MG PO CAPS
250.0000 mg | ORAL_CAPSULE | Freq: Two times a day (BID) | ORAL | Status: DC
  Administered 2023-01-14 – 2023-01-19 (×11): 250 mg via ORAL
  Filled 2023-01-14 (×11): qty 1

## 2023-01-14 MED ORDER — VITAMIN B-12 1000 MCG PO TABS
1000.0000 ug | ORAL_TABLET | Freq: Every day | ORAL | Status: DC
  Administered 2023-01-14 – 2023-01-19 (×6): 1000 ug via ORAL
  Filled 2023-01-14 (×6): qty 1

## 2023-01-14 MED ORDER — VITAMIN D (ERGOCALCIFEROL) 1.25 MG (50000 UNIT) PO CAPS
50000.0000 [IU] | ORAL_CAPSULE | ORAL | Status: DC
  Administered 2023-01-14: 50000 [IU] via ORAL
  Filled 2023-01-14: qty 1

## 2023-01-14 NOTE — Progress Notes (Signed)
Occupational Therapy Treatment Patient Details Name: Jeanne [name] MRN: [id number] DOB: June 09, 1951 Today's Date: 01/14/2023   History of present illness 73 y/o female recently hospitalized with C.Diff from 11/09/22-11/12/22, discharged with PO vancomycin. She states she completed this antibiotic course as prescribed. She reported that she did have resolution of her symptoms for a few weeks. She also had been COVID 19 + during this admission, but was asymptomatic and did not require treatment.  She reports that over the past week she has been nauseous and had poor PO intake, but denied vomiting. Over the last 4 days she has had multiple bouts of non-bloody diarrhea and bilateral lower quadrant abdominal pain/cramping. She reported these episodes seemed exactly the same in consistency and smell as during her previous C.Diff infection. She also reports generalized fatigue.   OT comments  Patient seen for re-evaluation. Chart reviewed to date. Pt received supine in bed. Agreeable to OT with encouragement. Pt engaged in BUE AROM exercises using red theraband for improved ROM/strength (see details below) while sitting up in bed. Pt deferred further EOB mobility and self-care tasks 2/2 fatigue and arthritis pain. Pt left as received with all needs in reach. Reviewed goals and updated based on progress. Pt continues to benefit from skilled OT to maximize safety and independence.     Recommendations for follow up therapy are one component of a multi-disciplinary discharge planning process, led by the attending physician.  Recommendations may be updated based on patient status, additional functional criteria and insurance authorization.    Assistance Recommended at Discharge Frequent or constant Supervision/Assistance  Patient can return home with the following  A lot of help with bathing/dressing/bathroom;Assistance with cooking/housework;Assist for transportation;Help with stairs or ramp for entrance;Two people  to help with walking and/or transfers   Equipment Recommendations  None recommended by OT    Recommendations for Other Services      Precautions / Restrictions Precautions Precautions: Fall Restrictions Weight Bearing Restrictions: Yes RLE Weight Bearing: Partial weight bearing RLE Partial Weight Bearing Percentage or Pounds: 20% per pt report Other Position/Activity Restrictions: Pt reports PWB s/p THA in October.       Mobility Bed Mobility Overal bed mobility: Needs Assistance             General bed mobility comments: pt deferred    Transfers         Balance Overall balance assessment: Needs assistance           ADL either performed or assessed with clinical judgement   ADL Overall ADL's : Needs assistance/impaired       General ADL Comments: Pt deferred EOB ADLs including grooming tasks despite encouragement.    Extremity/Trunk Assessment Upper Extremity Assessment Upper Extremity Assessment: Generalized weakness   Lower Extremity Assessment Lower Extremity Assessment: Generalized weakness        Vision Patient Visual Report: No change from baseline     Perception     Praxis      Cognition Arousal/Alertness: Awake/alert Behavior During Therapy: WFL for tasks assessed/performed Overall Cognitive Status: Within Functional Limits for tasks assessed     General Comments: Anxious re: rectal tube and arthritis pain. Very self limiting during session.        Exercises General Exercises - Upper Extremity Shoulder Flexion: AROM, Strengthening, Both, 10 reps, Seated, Theraband Theraband Level (Shoulder Flexion): Level 2 (Red) Shoulder Horizontal ABduction: AROM, Strengthening, Both, 10 reps, Seated, Theraband Theraband Level (Shoulder Horizontal Abduction): Level 2 (Red) Shoulder Horizontal ADduction: AROM, Strengthening, Both,  10 reps, Seated, Theraband Theraband Level (Shoulder Horizontal Adduction): Level 2 (Red) Elbow Flexion: AROM,  Strengthening, Both, 10 reps, Seated, Theraband Theraband Level (Elbow Flexion): Level 2 (Red) Elbow Extension: AROM, Strengthening, Both, 10 reps, Seated, Theraband Theraband Level (Elbow Extension): Level 2 (Red) Other Exercises Other Exercises: OT reviewed UE HEP using red theraband. Pt encouraged to complete x10 reps of each exercise, 2-3x/day while sitting up in bed.    Shoulder Instructions       General Comments      Pertinent Vitals/ Pain       Pain Assessment Pain Assessment: Faces Faces Pain Scale: Hurts little more Pain Location: "overall" due to arthritis Pain Descriptors / Indicators: Discomfort, Aching, Grimacing Pain Intervention(s): Limited activity within patient's tolerance, Monitored during session, Repositioned  Home Living        Prior Functioning/Environment              Frequency  Min 1X/week        Progress Toward Goals  OT Goals(current goals can now be found in the care plan section)  Progress towards OT goals: Progressing toward goals  Acute Rehab OT Goals Patient Stated Goal: to go home OT Goal Formulation: With patient Time For Goal Achievement: 01/28/23 Potential to Achieve Goals: Fair   Plan Discharge plan remains appropriate;Frequency remains appropriate    Co-evaluation                 AM-PAC OT "6 Clicks" Daily Activity     Outcome Measure   Help from another person eating meals?: A Little Help from another person taking care of personal grooming?: A Little Help from another person toileting, which includes using toliet, bedpan, or urinal?: Total Help from another person bathing (including washing, rinsing, drying)?: A Lot Help from another person to put on and taking off regular upper body clothing?: A Little Help from another person to put on and taking off regular lower body clothing?: Total 6 Click Score: 13    End of Session    OT Visit Diagnosis: Muscle weakness (generalized) (M62.81);Pain   Activity  Tolerance Patient limited by fatigue;Patient tolerated treatment well   Patient Left in bed;with call bell/phone within reach;with bed alarm set   Nurse Communication Mobility status        Time: 1610-9604 OT Time Calculation (min): 10 min  Charges: OT General Charges $OT Visit: 1 Visit OT Treatments $Therapeutic Exercise: 8-22 mins  [facility] MS, OTR/L ascom [phone]  01/14/23, 5:47 PM

## 2023-01-14 NOTE — Progress Notes (Signed)
Physical Therapy Treatment Patient Details Name: Jeanne [name] MRN: [id number] DOB: 23-Jan-1951 Today's Date: 01/14/2023   History of Present Illness 72 y/o female recently hospitalized with C.Diff from 11/09/22-11/12/22, discharged with PO vancomycin. She states she completed this antibiotic course as prescribed. She reported that she did have resolution of her symptoms for a few weeks. She also had been COVID 19 + during this admission, but was asymptomatic and did not require treatment.  She reports that over the past week she has been nauseous and had poor PO intake, but denied vomiting. Over the last 4 days she has had multiple bouts of non-bloody diarrhea and bilateral lower quadrant abdominal pain/cramping. She reported these episodes seemed exactly the same in consistency and smell as during her previous C.Diff infection. She also reports generalized fatigue.    PT Comments    Patient alert, agreeable to PT, but anxious about mobility. Re-evaluation and goals updated. With encouragement pt agreed to mobilize to sit EOB, but fearful of rectal tube being dislodged. maxA with extra time. Progressed from MaxA to maintain balance to close supervision, but reliant on BUE support. Able to sit EOB for 6-7 minutes. Returned to supine with maxA and repositioned for comfort. Pt with slow progress towards therapy goals but remains motivated, recommend continued skilled PT services.       Recommendations for follow up therapy are one component of a multi-disciplinary discharge planning process, led by the attending physician.  Recommendations may be updated based on patient status, additional functional criteria and insurance authorization.  Follow Up Recommendations  Can patient physically be transported by private vehicle: No    Assistance Recommended at Discharge Frequent or constant Supervision/Assistance  Patient can return home with the following Two people to help with walking and/or transfers;A  lot of help with bathing/dressing/bathroom;Assistance with cooking/housework;Assist for transportation;Help with stairs or ramp for entrance   Equipment Recommendations  Other (comment) (defer to next level of care)    Recommendations for Other Services       Precautions / Restrictions Precautions Precautions: Fall Restrictions RLE Weight Bearing: Partial weight bearing RLE Partial Weight Bearing Percentage or Pounds: 20% per pt report Other Position/Activity Restrictions: Pt reports PWB s/p THA in October.     Mobility  Bed Mobility Overal bed mobility: Needs Assistance Bed Mobility: Supine to Sit, Sit to Supine     Supine to sit: Max assist Sit to supine: Max assist        Transfers                   General transfer comment: unsafe to attempt - pt defers    Ambulation/Gait                   Stairs             Wheelchair Mobility    Modified Rankin (Stroke Patients Only)       Balance Overall balance assessment: Needs assistance Sitting-balance support: Bilateral upper extremity supported Sitting balance-Leahy Scale: Poor Sitting balance - Comments: posterior lean throughout, did progress from maxA to close supervision with BUE support Postural control: Posterior lean                                  Cognition Arousal/Alertness: Awake/alert Behavior During Therapy: WFL for tasks assessed/performed, Anxious Overall Cognitive Status: Within Functional Limits for tasks assessed  Exercises      General Comments        Pertinent Vitals/Pain Pain Assessment Pain Assessment: Faces Faces Pain Scale: Hurts little more Pain Location: with sitting Pain Descriptors / Indicators: Discomfort, Aching, Grimacing Pain Intervention(s): Limited activity within patient's tolerance, Monitored during session, Repositioned    Home Living                           Prior Function            PT Goals (current goals can now be found in the care plan section) Acute Rehab PT Goals Time For Goal Achievement: 01/28/23 Potential to Achieve Goals: Fair Progress towards PT goals: Progressing toward goals (minimally)    Frequency    Min 2X/week      PT Plan Current plan remains appropriate    Co-evaluation              AM-PAC PT "6 Clicks" Mobility   Outcome Measure  Help needed turning from your back to your side while in a flat bed without using bedrails?: A Lot Help needed moving from lying on your back to sitting on the side of a flat bed without using bedrails?: A Lot Help needed moving to and from a bed to a chair (including a wheelchair)?: Total Help needed standing up from a chair using your arms (e.g., wheelchair or bedside chair)?: Total Help needed to walk in hospital room?: Total Help needed climbing 3-5 steps with a railing? : Total 6 Click Score: 8    End of Session   Activity Tolerance: Patient limited by fatigue Patient left: in bed;with call bell/phone within reach;with bed alarm set Nurse Communication: Mobility status PT Visit Diagnosis: Difficulty in walking, not elsewhere classified (R26.2);Muscle weakness (generalized) (M62.81);Pain Pain - Right/Left: Right Pain - part of body: Hip     Time: 1191-4782 PT Time Calculation (min) (ACUTE ONLY): 16 min  Charges:  $Therapeutic Activity: 8-22 mins                     Olga Coaster PT, DPT 2:31 PM,01/14/23

## 2023-01-14 NOTE — TOC Progression Note (Signed)
Transition of Care [facility]) - Progression Note    Patient Details  Name: Jeanne [name] MRN: [id number] Date of Birth: 11/17/1950  [facility] CM/SW Contact  Chapman Fitch, RN Phone Number: 01/14/2023, 4:34 PM  Clinical Narrative:    Patient confirms plan remains for her to return home with her husband and [facility] home [facility].  Patient states that she has a WC, hospital bed, electric scooter, lift chair, and tub bench at home  Patient states that she will need EMS transport at discharge.  Confirms number on file is correct    Expected Discharge Plan: Home/Self Care Barriers to Discharge: Continued Medical Work up  Expected Discharge Plan and Services     Post Acute Care Choice: NA Living arrangements for the past 2 months: Single Family Home                                       Social Determinants of Health (SDOH) Interventions SDOH Screenings   Food Insecurity: No Food Insecurity (12/28/2022)  Housing: Low Risk  (12/28/2022)  Transportation Needs: No Transportation Needs (12/28/2022)  Utilities: Not At Risk (12/28/2022)  Alcohol Screen: Low Risk  (08/10/2020)  Depression (PHQ2-9): Low Risk  (08/10/2020)  Financial Resource Strain: Low Risk  (08/10/2020)  Physical Activity: Inactive (08/10/2020)  Social Connections: Moderately Integrated (08/10/2020)  Stress: No Stress Concern Present (08/10/2020)  Tobacco Use: Medium Risk (01/02/2023)    Readmission Risk Interventions    01/02/2023   12:24 PM  Readmission Risk Prevention Plan  Transportation Screening Complete  PCP or Specialist Appt within 3-5 Days Complete  Social Work Consult for Recovery Care Planning/Counseling Complete  Palliative Care Screening Not Applicable  Medication Review [employer]) Complete

## 2023-01-14 NOTE — Progress Notes (Signed)
[facility] Progress Note  Patient: Jeanne [name]    ZOX:[id number]  DOA: 12/27/2022     Date of Service: the patient was seen and examined on 01/14/2023  Chief Complaint  Patient presents with   Diarrhea    Frequent diarrhea x 4 days, Having 3 to 5 episodes a day. no appetite w nausea and  has been having increased weakness. Recently discharged from [facility] hospital for C diff. Has been making attempts to stay hydrated.    Brief hospital course: PMH of rheumatoid arthritis, cervical cancer, HTN, CKD 3B presents to the hospital with complaints of diarrhea was found to have pancolitis with septic shock secondary to C. difficile infection. She never required IV vasopressors but was treated with IV fluid and IV albumin. Currently on midodrine. ID was added for C. difficile infection. Concerning for recurrent infection currently pulse therapy with vancomycin. Also had AKI nephrology following.    The patient has a rectal tube for C Diff colitis. She began having copious liquid stool again last night that caused the tube to come loose.    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 Presents with abdominal pain and diarrhea. Found to have pancolitis on CT abdomen. Recently treated for CAD prior to admission. Due to ongoing diarrhea ID was consulted with regards to concern for C. difficile reoccurrence. Currently on pulse dose vancomycin therapy. Unable to afford Dificid outside of the hospital. Has needed rectal tube. Possibly may remove tomorrow. Semi solid stool in tube today.  No benefit from low-dose Questran and had some itching after that. Was treated with IV fluid as well as IV albumin.  Never required IV pressors.  Also received IV hydrocortisone.  Currently on midodrine therapy. Blood pressure is significantly better.  Will monitor. 5/6 started  probiotics twice daily   Acute kidney injury on CKD 3B. Anasarca. Hypoalbuminemia. Non-anion gap metabolic acidosis. Nephrology following. Receiving IV Lasix as well as bicarb therapy.  S/p IV albumin 1 more time on 5/4 before IV Lasix. Ultrasound shows evidence of mild bilateral hydronephrosis. Patient was on diclofenac which was discontinued due to her AKI. Lower extremity Doppler negative for any acute DVT but does show age-indeterminate nonocclusive SVT. 5/6 IV albumin every 6 hourly x 3 doses ordered  Acute HFpEF. Echocardiogram shows EF of 60/65%. No wall motion abnormality. Currently receiving diuretic therapy.   Rheumatoid arthritis. Not on any therapy. Oxycodone as needed for pain control. Diclofenac was discontinued due to AKI.   History of right total hip arthroplasty. Ongoing pain issues. Gets her care at [facility]. Outpatient follow-up with Dr. Leontine Locket after discharge from the hospital.    Bilateral hydronephrosis with nephrolithiasis. Outpatient follow-up with urology recommended.   Normocytic anemia. Likely from poor p.o. intake. Monitor.   Hypomagnesemia.  Hypokalemia. Supplemented.   Itching. Suspected developed after Questran. No rash.  Continue Atarax as needed  Vitamin D deficiency: started vitamin D 50,000 units p.o. weekly, follow with PCP to repeat vitamin D level after 3 to 6 months. Vitamin B12 level 287, goal >400, started vitamin B12 oral supplement.   Body mass index is 26.41 kg/m.  Nutrition Problem: Inadequate oral intake Etiology: acute illness Interventions:       Diet: Regular diet DVT Prophylaxis: Subcutaneous Lovenox   Advance goals of care discussion: Full code  Family Communication: family was not present at  bedside, at the time of interview.  The pt provided permission to discuss medical plan with the family. Opportunity was given to ask question and all questions were answered satisfactorily.    Disposition:  Pt is from Home, admitted with circulatory shock, C. difficile diarrhea, AKI, still has elevated creatinine and diarrhea, which precludes a safe discharge. Discharge to home, when clinically stable, may need few more days to improve.  Subjective: No significant events overnight, patient feels improvement in the diarrhea still has rectal tube due to loose stools.  Denies any abdominal pain, no nausea vomiting.  Tolerating diet well. Denies any chest pain or palpitation, no shortness of breath.  Physical Exam: General: NAD, lying comfortably Appear in no distress, affect appropriate Eyes: PERRLA ENT: Oral Mucosa Clear, moist  Neck: no JVD,  Cardiovascular: S1 and S2 Present, no Murmur,  Respiratory: good respiratory effort, Bilateral Air entry equal and Decreased, no Crackles, no wheezes Abdomen: Bowel Sound present, Soft and no tenderness, rectal tube intact Skin: no rashes Extremities: no Pedal edema, no calf tenderness Neurologic: without any new focal findings Gait not checked due to patient safety concerns  Vitals:   01/14/23 0442 01/14/23 0754 01/14/23 0916 01/14/23 1534  BP:  (!) 92/56  (!) 85/46  Pulse:  87  90  Resp:  16  18  Temp:  98.2 F (36.8 C)  98.2 F (36.8 C)  TempSrc:  Oral    SpO2:  94% 98% 100%  Weight: 69.8 kg     Height:        Intake/Output Summary (Last 24 hours) at 01/14/2023 1648 Last data filed at 01/14/2023 1500 Gross per 24 hour  Intake 50 ml  Output 900 ml  Net -850 ml   Filed Weights   01/11/23 0500 01/12/23 0412 01/14/23 0442  Weight: 70.3 kg 69.2 kg 69.8 kg    Data Reviewed: I have personally reviewed and interpreted daily labs, tele strips, imagings as discussed above. I reviewed all nursing notes, pharmacy notes, vitals, pertinent old records I have discussed plan of care as described above with RN and patient/family.  CBC: Recent Labs  Lab 01/08/23 0432 01/09/23 0603 01/13/23 0443 01/14/23 0519  WBC 8.8 8.2 9.5  7.7  NEUTROABS 6.8 6.3 6.8 5.6  HGB 8.3* 9.2* 8.8* 9.1*  HCT 26.2* 29.4* 29.0* 29.3*  MCV 87.6 87.2 89.5 89.3  PLT 263 285 284 274   Basic Metabolic Panel: Recent Labs  Lab 01/08/23 0432 01/09/23 0603 01/10/23 0514 01/11/23 0508 01/12/23 0424 01/13/23 0443 01/14/23 0519  NA 138 137 137 138 135 135 135  K 2.9* 4.1 3.9 3.5 3.2* 3.6 3.4*  CL 106 105 99 98 96* 96* 92*  CO2 25 27 29 31 30 30  32  GLUCOSE 108* 110* 108* 99 104* 104* 105*  BUN 41* 41* 37* 40* 33* 34* 36*  CREATININE 1.29* 1.14* 1.07* 1.08* 1.12* 1.15* 1.26*  CALCIUM 7.7* 7.8* 7.9* 8.1* 8.0* 8.0* 8.3*  MG  --  1.9 1.8 1.8 2.0  --  1.9  PHOS 2.9  --  3.0 3.0 3.2  --  3.3    Studies: No results found.  Scheduled Meds:  (feeding supplement) PROSource Plus  30 mL Oral TID BM   vitamin C  500 mg Oral BID   cholecalciferol  1,000 Units Oral Daily   vitamin B-12  1,000 mcg Oral Daily   enoxaparin (LOVENOX) injection  40 mg Subcutaneous Q24H   furosemide  40 mg Intravenous Daily   gabapentin  100 mg Oral QHS   Gerhardt's butt cream   Topical Daily   midodrine  5 mg Oral TID WC   multivitamin with minerals  1 tablet Oral Daily   nutrition supplement (JUVEN)  1 packet Oral BID BM   nystatin   Topical TID   saccharomyces boulardii  250 mg Oral BID   vancomycin  125 mg Oral BID   Followed by   Melene Muller ON 01/20/2023] vancomycin  125 mg Oral Daily   Followed by   Melene Muller ON 01/27/2023] vancomycin  125 mg Oral QODAY   Followed by   Melene Muller ON 02/04/2023] vancomycin  125 mg Oral Q3 days   vitamin A  10,000 Units Oral Daily   Vitamin D (Ergocalciferol)  50,000 Units Oral Q7 days   zinc sulfate  220 mg Oral Daily   Continuous Infusions:  sodium chloride Stopped (01/12/23 0600)   albumin human 12.5 g (01/14/23 1310)   PRN Meds: acetaminophen, diphenhydrAMINE, ondansetron (ZOFRAN) IV, mouth rinse, oxyCODONE, polyethylene glycol, promethazine  Time spent: 35 minutes  Author: Gillis Santa. MD [facility] 01/14/2023  4:48 PM  To reach On-call, see care teams to locate the attending and reach out to them via [email or web address]. If 7PM-7AM, please contact night-coverage If you still have difficulty reaching the attending provider, please page the [facility] (Director on Call) for [facility] on amion for assistance.

## 2023-01-15 DIAGNOSIS — K5100 Ulcerative (chronic) pancolitis without complications: Secondary | ICD-10-CM | POA: Diagnosis not present

## 2023-01-15 LAB — CBC
HCT: 26.9 % — ABNORMAL LOW (ref 36.0–46.0)
Hemoglobin: 8.2 g/dL — ABNORMAL LOW (ref 12.0–15.0)
MCH: 27 pg (ref 26.0–34.0)
MCHC: 30.5 g/dL (ref 30.0–36.0)
MCV: 88.5 fL (ref 80.0–100.0)
Platelets: 245 10*3/uL (ref 150–400)
RBC: 3.04 MIL/uL — ABNORMAL LOW (ref 3.87–5.11)
RDW: 15.9 % — ABNORMAL HIGH (ref 11.5–15.5)
WBC: 5.8 10*3/uL (ref 4.0–10.5)
nRBC: 0 % (ref 0.0–0.2)

## 2023-01-15 LAB — GLUCOSE, CAPILLARY: Glucose-Capillary: 102 mg/dL — ABNORMAL HIGH (ref 70–99)

## 2023-01-15 LAB — BASIC METABOLIC PANEL
Anion gap: 10 (ref 5–15)
BUN: 37 mg/dL — ABNORMAL HIGH (ref 8–23)
CO2: 33 mmol/L — ABNORMAL HIGH (ref 22–32)
Calcium: 8.2 mg/dL — ABNORMAL LOW (ref 8.9–10.3)
Chloride: 93 mmol/L — ABNORMAL LOW (ref 98–111)
Creatinine, Ser: 1.31 mg/dL — ABNORMAL HIGH (ref 0.44–1.00)
GFR, Estimated: 44 mL/min — ABNORMAL LOW (ref >60)
Glucose, Bld: 105 mg/dL — ABNORMAL HIGH (ref 70–99)
Potassium: 3.1 mmol/L — ABNORMAL LOW (ref 3.5–5.1)
Sodium: 136 mmol/L (ref 135–145)

## 2023-01-15 LAB — HEPATIC FUNCTION PANEL
ALT: 13 U/L (ref 0–44)
AST: 21 U/L (ref 15–41)
Albumin: 2.9 g/dL — ABNORMAL LOW (ref 3.5–5.0)
Alkaline Phosphatase: 87 U/L (ref 38–126)
Bilirubin, Direct: 0.2 mg/dL (ref 0.0–0.2)
Indirect Bilirubin: 0.7 mg/dL (ref 0.3–0.9)
Total Bilirubin: 0.9 mg/dL (ref 0.3–1.2)
Total Protein: 5.6 g/dL — ABNORMAL LOW (ref 6.5–8.1)

## 2023-01-15 LAB — MAGNESIUM: Magnesium: 1.9 mg/dL (ref 1.7–2.4)

## 2023-01-15 LAB — PHOSPHORUS: Phosphorus: 3.2 mg/dL (ref 2.5–4.6)

## 2023-01-15 MED ORDER — FUROSEMIDE 40 MG PO TABS: 40.0000 mg | ORAL_TABLET | Freq: Every day | ORAL | Status: DC

## 2023-01-15 MED ORDER — POTASSIUM CHLORIDE CRYS ER 20 MEQ PO TBCR: 40.0000 meq | EXTENDED_RELEASE_TABLET | Freq: Once | ORAL | Status: DC

## 2023-01-15 MED ORDER — POTASSIUM CHLORIDE CRYS ER 20 MEQ PO TBCR
20.0000 meq | EXTENDED_RELEASE_TABLET | Freq: Once | ORAL | Status: AC
  Administered 2023-01-15: 20 meq via ORAL
  Filled 2023-01-15: qty 1

## 2023-01-15 MED ORDER — ALBUMIN HUMAN 25 % IV SOLN
12.5000 g | Freq: Four times a day (QID) | INTRAVENOUS | Status: AC
  Administered 2023-01-15 (×3): 12.5 g via INTRAVENOUS
  Filled 2023-01-15 (×3): qty 50

## 2023-01-15 MED ORDER — POTASSIUM CHLORIDE 10 MEQ/100ML IV SOLN
10.0000 meq | INTRAVENOUS | Status: AC
  Administered 2023-01-15 (×3): 10 meq via INTRAVENOUS
  Filled 2023-01-15 (×3): qty 100

## 2023-01-15 NOTE — Progress Notes (Signed)
Nutrition Follow Up Note   DOCUMENTATION CODES:   Not applicable  INTERVENTION:   Pro-Source Plus 30ml PO TID- Each supplement provides 100kcal and 15g protein    MVI po daily  Vitamin C 500mg  po BID  Vitamin D2 50,000 units po weekly    Vitamin A 10,000 units po daily   Zinc 220mg  po daily x 30 days  Juven Fruit Punch po BID, each serving provides 95kcal and 2.5g of protein (amino acids glutamine and arginine)  Daily weights  Snacks- 10am, 2pm & 8pm   NUTRITION DIAGNOSIS:   Inadequate oral intake related to acute illness as evidenced by per patient/family report. -improved   GOAL:   Patient will meet greater than or equal to 90% of their needs -progressing   MONITOR:   PO intake, Supplement acceptance, Diet advancement, Labs, Weight trends, I & O's, Skin  ASSESSMENT:   72 y/o female with h/o HTN, cervical cancer, RA, C. diff, CKD III and recent COVID 19 who is admitted with pancolitis and sepsis.  Pt continues to have good appetite and oral intake. Pt is taking most of the ProSource and drinking the Juven. Pt is taking her vitamin supplementation. Probiotics started yesterday; pt has been getting yogurt snacks. Per chart, pt appears weight stable since admission. Diarrhea seems improved.      Medications reviewed and include: vitamin C, D2, B12, lovenox, MVI, juven, florastor, vancomycin, vitamin A, zinc, albumin   Labs reviewed: K 3.1(L), BUN 37(H), creat 1.31(H), P 3.2 wnl, Mg 1.9 wnl Folate 9.3 wnl, vitamin D 26.50(L), zinc 37(L), vitamin A 2.5(L), B12 287- 4/20 Hgb 8.2(L), Hct 26.9(L)  Diet Order:   Diet Order             Diet regular Fluid consistency: Thin  Diet effective now                  EDUCATION NEEDS:   Education needs have been addressed  Skin:  Skin Assessment: Reviewed RN Assessment (non pressure wound buttocks)  Last BM:  5/6- TYPE 7  Height:   Ht Readings from Last 1 Encounters:  12/27/22 5\' 4"  (1.626 m)    Weight:    Wt Readings from Last 1 Encounters:  01/14/23 69.8 kg    Ideal Body Weight:  54.5 kg  BMI:  Body mass index is 26.41 kg/m.  Estimated Nutritional Needs:   Kcal:  1600-1800kcal/day  Protein:  80-90g/day  Fluid:  1.5-1.7L/day  Betsey Holiday MS, RD, LDN Please refer to [facility] for RD and/or RD on-call/weekend/after hours pager

## 2023-01-15 NOTE — Progress Notes (Addendum)
Patient refused rectal tube removal, verbalized that her stool is not hard/formed,  MD made aware.

## 2023-01-15 NOTE — Progress Notes (Signed)
[facility] Progress Note  Patient: Jeanne [name]    BMW:[id number]  DOA: 12/27/2022     Date of Service: the patient was seen and examined on 01/15/2023  Chief Complaint  Patient presents with   Diarrhea    Frequent diarrhea x 4 days, Having 3 to 5 episodes a day. no appetite w nausea and  has been having increased weakness. Recently discharged from [facility] hospital for C diff. Has been making attempts to stay hydrated.    Brief hospital course: PMH of rheumatoid arthritis, cervical cancer, HTN, CKD 3B presents to the hospital with complaints of diarrhea was found to have pancolitis with septic shock secondary to C. difficile infection. She never required IV vasopressors but was treated with IV fluid and IV albumin. Currently on midodrine. ID was added for C. difficile infection. Concerning for recurrent infection currently pulse therapy with vancomycin. Also had AKI nephrology following.    The patient has a rectal tube for C Diff colitis. She began having copious liquid stool again last night that caused the tube to come loose.    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 Presents with abdominal pain and diarrhea. Found to have pancolitis on CT abdomen. Recently treated for CAD prior to admission. Due to ongoing diarrhea ID was consulted with regards to concern for C. difficile reoccurrence. Currently on pulse dose vancomycin therapy. Unable to afford Dificid outside of the hospital. Has needed rectal tube. Possibly may remove tomorrow. Semi solid stool in tube today.  No benefit from low-dose Questran and had some itching after that. Was treated with IV fluid as well as IV albumin.  Never required IV pressors.  Also received IV hydrocortisone.  Currently on midodrine therapy. Blood pressure is significantly better.  Will monitor. 5/6 started  probiotics twice daily   # Acute kidney injury on CKD 3B. # Anasarca, Resolved s/p IV Lasix # Hypoalbuminemia. # Non-anion gap metabolic acidosis, Resolved Nephrology was consulted. S/p IV Lasix and bicarb was given. S/p IV albumin. Ultrasound shows evidence of mild bilateral hydronephrosis. Patient was on diclofenac which was discontinued due to her AKI. Lower extremity Doppler negative for any acute DVT but does show age-indeterminate nonocclusive SVT. 5/6 IV albumin every 6 hourly x 6 doses ordered  # Acute HFpEF. Echocardiogram shows EF of 60-65%. No wall motion abnormality. S/p IV lasix   # Rheumatoid arthritis. Not on any therapy. Oxycodone as needed for pain control. Diclofenac was discontinued due to AKI.   # History of right total hip arthroplasty. Ongoing pain issues. Gets her care at [facility]. Outpatient follow-up with Dr. Leontine Locket after discharge from the hospital.    # Bilateral hydronephrosis with nephrolithiasis. Outpatient follow-up with urology recommended.   # Normocytic anemia. Likely from poor p.o. intake. Monitor.   # Hypokalemia most likely due to diuretics.  Potassium repleted. # Hypomagnesemia, mag repleted and resolved. Monitor electrolytes and replete as needed.   # Itching. Suspected developed after Questran. No rash.  Continue Atarax as needed  # Vitamin D deficiency: started vitamin D 50,000 units p.o. weekly, follow with PCP to repeat vitamin D level after 3 to 6 months. # Vitamin B12 level 287, goal >400, started vitamin B12 oral supplement.   Body mass index is 26.41 kg/m.  Nutrition Problem: Inadequate oral intake Etiology: acute illness Interventions:  Diet: Regular diet DVT Prophylaxis: Subcutaneous Lovenox   Advance goals of care discussion: Full code  Family Communication: family was not present at bedside, at the time of interview.  The pt provided permission to discuss medical plan with the family.  Opportunity was given to ask question and all questions were answered satisfactorily.   Disposition:  Pt is from Home, admitted with circulatory shock, C. difficile diarrhea, AKI, still has elevated creatinine and diarrhea, which precludes a safe discharge. Discharge to home, when clinically stable, may need few more days to improve.  Subjective: No significant events overnight, patient is feeling improvement in the diarrhea, denies any abdominal pain, no nausea vomiting.  Overall patient feels improvement.  Patient agreed with discontinuation of the rectal tube if diarrhea seems to be improving throughout the day.   Physical Exam: General: NAD, lying comfortably Appear in no distress, affect appropriate Eyes: PERRLA ENT: Oral Mucosa Clear, moist  Neck: no JVD,  Cardiovascular: S1 and S2 Present, no Murmur,  Respiratory: good respiratory effort, Bilateral Air entry equal and Decreased, no Crackles, no wheezes Abdomen: Bowel Sound present, Soft and no tenderness, rectal tube intact Skin: no rashes Extremities: no Pedal edema, no calf tenderness Neurologic: without any new focal findings Gait not checked due to patient safety concerns  Vitals:   01/15/23 0827 01/15/23 0828 01/15/23 0940 01/15/23 1543  BP: (!) 100/47 (!) 98/48  113/66  Pulse: 78 77  80  Resp: 19     Temp: 98.3 F (36.8 C)  98.6 F (37 C)   TempSrc: Oral  Oral   SpO2: 97% 95%  97%  Weight:      Height:        Intake/Output Summary (Last 24 hours) at 01/15/2023 1649 Last data filed at 01/15/2023 1613 Gross per 24 hour  Intake 427.67 ml  Output 2100 ml  Net -1672.33 ml   Filed Weights   01/11/23 0500 01/12/23 0412 01/14/23 0442  Weight: 70.3 kg 69.2 kg 69.8 kg    Data Reviewed: I have personally reviewed and interpreted daily labs, tele strips, imagings as discussed above. I reviewed all nursing notes, pharmacy notes, vitals, pertinent old records I have discussed plan of care as described above with RN and  patient/family.  CBC: Recent Labs  Lab 01/09/23 0603 01/13/23 0443 01/14/23 0519 01/15/23 0443  WBC 8.2 9.5 7.7 5.8  NEUTROABS 6.3 6.8 5.6  --   HGB 9.2* 8.8* 9.1* 8.2*  HCT 29.4* 29.0* 29.3* 26.9*  MCV 87.2 89.5 89.3 88.5  PLT 285 284 274 245   Basic Metabolic Panel: Recent Labs  Lab 01/10/23 0514 01/11/23 0508 01/12/23 0424 01/13/23 0443 01/14/23 0519 01/15/23 0443  NA 137 138 135 135 135 136  K 3.9 3.5 3.2* 3.6 3.4* 3.1*  CL 99 98 96* 96* 92* 93*  CO2 29 31 30 30  32 33*  GLUCOSE 108* 99 104* 104* 105* 105*  BUN 37* 40* 33* 34* 36* 37*  CREATININE 1.07* 1.08* 1.12* 1.15* 1.26* 1.31*  CALCIUM 7.9* 8.1* 8.0* 8.0* 8.3* 8.2*  MG 1.8 1.8 2.0  --  1.9 1.9  PHOS 3.0 3.0 3.2  --  3.3 3.2    Studies: No results found.  Scheduled Meds:  (feeding supplement) PROSource Plus  30 mL Oral TID BM   vitamin C  500 mg Oral BID   vitamin B-12  1,000 mcg Oral Daily   enoxaparin (LOVENOX) injection  40 mg Subcutaneous Q24H   gabapentin  100 mg Oral QHS  Gerhardt's butt cream   Topical Daily   midodrine  5 mg Oral TID WC   multivitamin with minerals  1 tablet Oral Daily   nutrition supplement (JUVEN)  1 packet Oral BID BM   nystatin   Topical TID   saccharomyces boulardii  250 mg Oral BID   vancomycin  125 mg Oral BID   Followed by   Melene Muller ON 01/20/2023] vancomycin  125 mg Oral Daily   Followed by   Melene Muller ON 01/27/2023] vancomycin  125 mg Oral QODAY   Followed by   Melene Muller ON 02/04/2023] vancomycin  125 mg Oral Q3 days   vitamin A  10,000 Units Oral Daily   Vitamin D (Ergocalciferol)  50,000 Units Oral Q7 days   zinc sulfate  220 mg Oral Daily   Continuous Infusions:  sodium chloride Stopped (01/12/23 0600)   albumin human Stopped (01/15/23 1038)   PRN Meds: acetaminophen, diphenhydrAMINE, ondansetron (ZOFRAN) IV, mouth rinse, oxyCODONE, polyethylene glycol, promethazine  Time spent: 50 minutes  Author: Gillis Santa. MD [facility] 01/15/2023 4:49 PM  To  reach On-call, see care teams to locate the attending and reach out to them via [email or web address]. If 7PM-7AM, please contact night-coverage If you still have difficulty reaching the attending provider, please page the [facility] (Director on Call) for [facility] on amion for assistance.

## 2023-01-15 NOTE — Consult Note (Addendum)
[facility] service manager [facility]) Accountable Care Organization (ACO) [facility] Liaison Note  01/15/2023  Jeanne [name] 1951-01-20 [id number]  Location: [facility] RN Hospital Liaison screened the patient remotely at [facility].  Insurance: [name] is a 72 y.o. female who is a Primary Care Patient of Jerl Mina, MD.  [facility]. The patient was screened for readmission hospitalization with noted extreme risk score for unplanned readmission risk with 2 IP/ 1 ED in 6 months.  The patient was assessed for potential [facility]) Care Management service needs for post hospital transition for care coordination. Review of patient's electronic medical record reveals patient recommended by PT/OT for SNF placement for ongoing rehabilitation.  [facility] Liaison will refer to PC-RN accordingly if it is a facility [facility] follows. Note LOS currently at 18 days.  Plan: [facility] Liaison will continue to follow progress and disposition to asess for post hospital community care coordination/management needs.  Referral request for community care coordination: pending disposition.   [facility] Care Management/Population Health does not replace or interfere with any arrangements made by the Inpatient Transition of Care team.   For questions contact:    Elliot Cousin, RN, BSN [facility] Liaison [place]   [facility]  Population Health Office Hours MTWF 8:00 am to 6 pm off on Thursday [phone] mobile [phone] [Office toll free line][facility] Office Hours are M-F 8:30 - 5 pm 24 hour nurse advise line [phone] Conceirge  [email or web address]

## 2023-01-15 NOTE — Progress Notes (Signed)
Physical Therapy Treatment Patient Details Name: Jeanne [name] MRN: [id number] DOB: 06-May-1951 Today's Date: 01/15/2023   History of Present Illness 72 y/o female recently hospitalized with C.Diff from 11/09/22-11/12/22, discharged with PO vancomycin. She states she completed this antibiotic course as prescribed. She reported that she did have resolution of her symptoms for a few weeks. She also had been COVID 19 + during this admission, but was asymptomatic and did not require treatment.  She reports that over the past week she has been nauseous and had poor PO intake, but denied vomiting. Over the last 4 days she has had multiple bouts of non-bloody diarrhea and bilateral lower quadrant abdominal pain/cramping. She reported these episodes seemed exactly the same in consistency and smell as during her previous C.Diff infection. She also reports generalized fatigue.    PT Comments    Pt ready for session but stated she was generally fatigued and tired.  She does agree to supine ex but does not want to try sitting or standing due to pain from rectal tube yesterday with mobility.  BLE AAROM x 10 to tolerance with limited ranges.  Pt fatigued from activity and declined more ex.  She is encouraged to do HEP as able on her own.    Discussed discharge plan with pt.  Stated she feels comfortable with discharge home with husband and she has all needed equipment.   Recommendations for follow up therapy are one component of a multi-disciplinary discharge planning process, led by the attending physician.  Recommendations may be updated based on patient status, additional functional criteria and insurance authorization.  Follow Up Recommendations       Assistance Recommended at Discharge Frequent or constant Supervision/Assistance  Patient can return home with the following Two people to help with walking and/or transfers;A lot of help with bathing/dressing/bathroom;Assistance with cooking/housework;Assist  for transportation;Help with stairs or ramp for entrance   Equipment Recommendations       Recommendations for Other Services       Precautions / Restrictions Precautions Precautions: Fall Restrictions Weight Bearing Restrictions: Yes RLE Weight Bearing: Partial weight bearing RLE Partial Weight Bearing Percentage or Pounds: 20% per pt report Other Position/Activity Restrictions: Pt reports PWB s/p THA in October.     Mobility  Bed Mobility               General bed mobility comments: pt deferred due to pain with rectal  tube Patient Response: Cooperative  Transfers                        Ambulation/Gait                   Stairs             Wheelchair Mobility    Modified Rankin (Stroke Patients Only)       Balance                                            Cognition Arousal/Alertness: Awake/alert Behavior During Therapy: WFL for tasks assessed/performed Overall Cognitive Status: Within Functional Limits for tasks assessed                                 General Comments: a bit teary today  Exercises Other Exercises Other Exercises: BLE AAROM x 10 to tolerance    General Comments        Pertinent Vitals/Pain Pain Assessment Pain Assessment: Faces Faces Pain Scale: Hurts even more Pain Location: "overall" due to arthritis Pain Descriptors / Indicators: Discomfort, Aching, Grimacing Pain Intervention(s): Limited activity within patient's tolerance, Monitored during session, Repositioned    Home Living                          Prior Function            PT Goals (current goals can now be found in the care plan section) Progress towards PT goals: Progressing toward goals    Frequency    Min 2X/week      PT Plan Current plan remains appropriate    Co-evaluation              AM-PAC PT "6 Clicks" Mobility   Outcome Measure  Help needed turning from  your back to your side while in a flat bed without using bedrails?: A Lot Help needed moving from lying on your back to sitting on the side of a flat bed without using bedrails?: A Lot Help needed moving to and from a bed to a chair (including a wheelchair)?: Total Help needed standing up from a chair using your arms (e.g., wheelchair or bedside chair)?: Total Help needed to walk in hospital room?: Total Help needed climbing 3-5 steps with a railing? : Total 6 Click Score: 8    End of Session Equipment Utilized During Treatment: Gait belt Activity Tolerance: Patient limited by fatigue;Patient limited by pain Patient left: in bed;with call bell/phone within reach;with bed alarm set Nurse Communication: Mobility status PT Visit Diagnosis: Difficulty in walking, not elsewhere classified (R26.2);Muscle weakness (generalized) (M62.81);Pain Pain - Right/Left: Right Pain - part of body: Hip     Time: 9562-1308 PT Time Calculation (min) (ACUTE ONLY): 15 min  Charges:  $Therapeutic Exercise: 8-22 mins                   Danielle Dess, PTA 01/15/23, 3:46 PM

## 2023-01-16 DIAGNOSIS — K5100 Ulcerative (chronic) pancolitis without complications: Secondary | ICD-10-CM | POA: Diagnosis not present

## 2023-01-16 LAB — MAGNESIUM: Magnesium: 2 mg/dL (ref 1.7–2.4)

## 2023-01-16 LAB — BASIC METABOLIC PANEL
Anion gap: 9 (ref 5–15)
BUN: 31 mg/dL — ABNORMAL HIGH (ref 8–23)
CO2: 32 mmol/L (ref 22–32)
Calcium: 8.6 mg/dL — ABNORMAL LOW (ref 8.9–10.3)
Chloride: 99 mmol/L (ref 98–111)
Creatinine, Ser: 1.24 mg/dL — ABNORMAL HIGH (ref 0.44–1.00)
GFR, Estimated: 47 mL/min — ABNORMAL LOW (ref >60)
Glucose, Bld: 92 mg/dL (ref 70–99)
Potassium: 3.7 mmol/L (ref 3.5–5.1)
Sodium: 140 mmol/L (ref 135–145)

## 2023-01-16 LAB — CBC
HCT: 26.4 % — ABNORMAL LOW (ref 36.0–46.0)
Hemoglobin: 7.9 g/dL — ABNORMAL LOW (ref 12.0–15.0)
MCH: 26.9 pg (ref 26.0–34.0)
MCHC: 29.9 g/dL — ABNORMAL LOW (ref 30.0–36.0)
MCV: 89.8 fL (ref 80.0–100.0)
Platelets: 225 10*3/uL (ref 150–400)
RBC: 2.94 MIL/uL — ABNORMAL LOW (ref 3.87–5.11)
RDW: 15.9 % — ABNORMAL HIGH (ref 11.5–15.5)
WBC: 3.8 10*3/uL — ABNORMAL LOW (ref 4.0–10.5)
nRBC: 0 % (ref 0.0–0.2)

## 2023-01-16 LAB — PHOSPHORUS: Phosphorus: 3 mg/dL (ref 2.5–4.6)

## 2023-01-16 NOTE — Progress Notes (Signed)
Occupational Therapy Treatment Patient Details Name: Jeanne [name] MRN: [id number] DOB: [birth year] Today's Date: 01/16/2023   History of present illness 72 y/o female recently hospitalized with C.Diff from 11/09/22-11/12/22, discharged with PO vancomycin. She states she completed this antibiotic course as prescribed. She reported that she did have resolution of her symptoms for a few weeks. She also had been COVID 19 + during this admission, but was asymptomatic and did not require treatment.  She reports that over the past week she has been nauseous and had poor PO intake, but denied vomiting. Over the last 4 days she has had multiple bouts of non-bloody diarrhea and bilateral lower quadrant abdominal pain/cramping. She reported these episodes seemed exactly the same in consistency and smell as during her previous C.Diff infection. She also reports generalized fatigue.   OT comments  Patient received supine in bed with PT present. Pt agreeable to OT/PT co-treatment to maximize safety and participation. Pt completed bed mobility with Max A +2 this date. Pt required varying levels of assistance to maintain static sitting balance at EOB. CGA momentarily (~10 seconds), however, predominantly reliant on BUE support and Min-Max A from therapist. Pt requesting to wash her back while sitting EOB and required Max A. Pt endorsed fatigue and was returned to bed. Pt then engaged in BUE AROM exercises using yellow theraband for improved ROM/strength (see details below). Pt left with NT present in room. Pt is making progress toward goal completion. D/C recommendation remains appropriate. OT will continue to follow acutely.    Recommendations for follow up therapy are one component of a multi-disciplinary discharge planning process, led by the attending physician.  Recommendations may be updated based on patient status, additional functional criteria and insurance authorization.    Assistance Recommended at Discharge  Frequent or constant Supervision/Assistance  Patient can return home with the following  A lot of help with bathing/dressing/bathroom;Assistance with cooking/housework;Assist for transportation;Help with stairs or ramp for entrance;Two people to help with walking and/or transfers   Equipment Recommendations  None recommended by OT    Recommendations for Other Services      Precautions / Restrictions Precautions Precautions: Fall Restrictions Weight Bearing Restrictions: Yes RLE Weight Bearing: Partial weight bearing RLE Partial Weight Bearing Percentage or Pounds: 20% wb per report Other Position/Activity Restrictions: Pt reports PWB s/p THA in October.       Mobility Bed Mobility Overal bed mobility: Needs Assistance Bed Mobility: Supine to Sit, Sit to Supine     Supine to sit: Max assist, +2 for physical assistance Sit to supine: Max assist, +2 for physical assistance      General bed mobility comments: +2 to scoot pt up toward [facility]   Transfers       General transfer comment: unsafe to attempt - pt defers     Balance Overall balance assessment: Needs assistance Sitting-balance support: Bilateral upper extremity supported Sitting balance-Leahy Scale: Poor Sitting balance - Comments: varying levels of assistance required, CGA momentarily (~10 seconds), predominantly reliant on BUE support or Min-Max A from therapist         ADL either performed or assessed with clinical judgement   ADL Overall ADL's : Needs assistance/impaired         Upper Body Bathing: Maximal assistance;Sitting Upper Body Bathing Details (indicate cue type and reason): to wash back        Extremity/Trunk Assessment Upper Extremity Assessment Upper Extremity Assessment: Generalized weakness   Lower Extremity Assessment Lower Extremity Assessment: Generalized weakness  Vision Patient Visual Report: No change from baseline     Perception     Praxis      Cognition  Arousal/Alertness: Awake/alert Behavior During Therapy: WFL for tasks assessed/performed Overall Cognitive Status: Within Functional Limits for tasks assessed            Exercises General Exercises - Upper Extremity Shoulder Flexion: AROM, Strengthening, Both, 10 reps, Seated, Theraband Theraband Level (Shoulder Flexion): Level 1 (Yellow) Shoulder Horizontal ABduction: AROM, Strengthening, Both, 10 reps, Seated, Theraband Theraband Level (Shoulder Horizontal Abduction): Level 1 (Yellow) Shoulder Horizontal ADduction: AROM, Strengthening, Both, 10 reps, Seated, Theraband Theraband Level (Shoulder Horizontal Adduction): Level 1 (Yellow)    Shoulder Instructions       General Comments      Pertinent Vitals/ Pain       Pain Assessment Pain Assessment: Faces Faces Pain Scale: Hurts little more Pain Location: low back in sitting Pain Descriptors / Indicators: Grimacing, Guarding, Moaning Pain Intervention(s): Limited activity within patient's tolerance, Monitored during session, Repositioned  Home Living       Prior Functioning/Environment              Frequency  Min 1X/week        Progress Toward Goals  OT Goals(current goals can now be found in the care plan section)  Progress towards OT goals: Progressing toward goals  Acute Rehab OT Goals Patient Stated Goal: to go home OT Goal Formulation: With patient Time For Goal Achievement: 01/28/23 Potential to Achieve Goals: Fair  Plan Discharge plan remains appropriate;Frequency remains appropriate    Co-evaluation    PT/OT/SLP Co-Evaluation/Treatment: Yes Reason for Co-Treatment: To address functional/ADL transfers PT goals addressed during session: Mobility/safety with mobility;Balance;Strengthening/ROM OT goals addressed during session: ADL's and self-care      AM-PAC OT "6 Clicks" Daily Activity     Outcome Measure   Help from another person eating meals?: A Little Help from another person taking  care of personal grooming?: A Little Help from another person toileting, which includes using toliet, bedpan, or urinal?: Total Help from another person bathing (including washing, rinsing, drying)?: A Lot Help from another person to put on and taking off regular upper body clothing?: A Little Help from another person to put on and taking off regular lower body clothing?: Total 6 Click Score: 13    End of Session    OT Visit Diagnosis: Muscle weakness (generalized) (M62.81);Pain   Activity Tolerance Patient limited by fatigue   Patient Left in bed;with call bell/phone within reach;with bed alarm set;Other (comment) (NT present in room to give bed bath)   Nurse Communication Mobility status        Time: 1341-1401 OT Time Calculation (min): 20 min  Charges: OT General Charges $OT Visit: 1 Visit OT Treatments $Self Care/Home Management : 8-22 mins  [facility] MS, OTR/L ascom [phone]  01/16/23, 3:30 PM

## 2023-01-16 NOTE — Progress Notes (Signed)
Physical Therapy Treatment Patient Details Name: Jeanne [name] MRN: [id number] DOB: Jun 27, 1951 Today's Date: 01/16/2023   History of Present Illness 72 y/o female recently hospitalized with C.Diff from 11/09/22-11/12/22, discharged with PO vancomycin. She states she completed this antibiotic course as prescribed. She reported that she did have resolution of her symptoms for a few weeks. She also had been COVID 19 + during this admission, but was asymptomatic and did not require treatment.  She reports that over the past week she has been nauseous and had poor PO intake, but denied vomiting. Over the last 4 days she has had multiple bouts of non-bloody diarrhea and bilateral lower quadrant abdominal pain/cramping. She reported these episodes seemed exactly the same in consistency and smell as during her previous C.Diff infection. She also reports generalized fatigue.    PT Comments    Patient alert, seen by PT/OT to maximize function. Session focused on sitting tasks and therapeutic exercises. maxAx2 for mobility, and able to sit with varying degrees of assistance, ~10seconds of unsupported close supervision sitting this session. Needed BUE support throughout remainder of session. The patient would benefit from further skilled PT intervention to continue to progress towards goals.    Recommendations for follow up therapy are one component of a multi-disciplinary discharge planning process, led by the attending physician.  Recommendations may be updated based on patient status, additional functional criteria and insurance authorization.  Follow Up Recommendations  Can patient physically be transported by private vehicle: No    Assistance Recommended at Discharge Frequent or constant Supervision/Assistance  Patient can return home with the following Two people to help with walking and/or transfers;A lot of help with bathing/dressing/bathroom;Assistance with cooking/housework;Assist for  transportation;Help with stairs or ramp for entrance   Equipment Recommendations  Other (comment) (defer to next level of care)    Recommendations for Other Services       Precautions / Restrictions Precautions Precautions: Fall Restrictions Weight Bearing Restrictions: Yes RLE Weight Bearing: Partial weight bearing RLE Partial Weight Bearing Percentage or Pounds: 20% wb per report Other Position/Activity Restrictions: Pt reports PWB s/p THA in October.     Mobility  Bed Mobility Overal bed mobility: Needs Assistance Bed Mobility: Supine to Sit, Sit to Supine     Supine to sit: Max assist, +2 for safety/equipment Sit to supine: Max assist, +2 for physical assistance        Transfers                   General transfer comment: unsafe to attempt - pt defers    Ambulation/Gait                   Stairs             Wheelchair Mobility    Modified Rankin (Stroke Patients Only)       Balance Overall balance assessment: Needs assistance Sitting-balance support: Bilateral upper extremity supported Sitting balance-Leahy Scale: Poor Sitting balance - Comments: varying levels of assistance, CGA momentarily, predominantly reliant on BUE support or min-maxA                                    Cognition Arousal/Alertness: Awake/alert Behavior During Therapy: WFL for tasks assessed/performed Overall Cognitive Status: Within Functional Limits for tasks assessed  Exercises General Exercises - Lower Extremity Short Arc Quad: AROM, Right, Left, Strengthening, Both, 10 reps Long Arc Quad: AROM, Strengthening, Both, 10 reps Hip ABduction/ADduction: AROM, Left, AAROM, Right    General Comments        Pertinent Vitals/Pain Pain Assessment Pain Assessment: Faces Faces Pain Scale: Hurts little more Pain Location: in sitting Pain Descriptors / Indicators: Grimacing, Guarding,  Moaning Pain Intervention(s): Limited activity within patient's tolerance, Monitored during session, Repositioned    Home Living                          Prior Function            PT Goals (current goals can now be found in the care plan section) Progress towards PT goals: Progressing toward goals    Frequency    Min 2X/week      PT Plan Current plan remains appropriate    Co-evaluation PT/OT/SLP Co-Evaluation/Treatment: Yes Reason for Co-Treatment: To address functional/ADL transfers PT goals addressed during session: Mobility/safety with mobility;Balance;Strengthening/ROM OT goals addressed during session: ADL's and self-care      AM-PAC PT "6 Clicks" Mobility   Outcome Measure  Help needed turning from your back to your side while in a flat bed without using bedrails?: A Lot Help needed moving from lying on your back to sitting on the side of a flat bed without using bedrails?: A Lot Help needed moving to and from a bed to a chair (including a wheelchair)?: Total Help needed standing up from a chair using your arms (e.g., wheelchair or bedside chair)?: Total Help needed to walk in hospital room?: Total Help needed climbing 3-5 steps with a railing? : Total 6 Click Score: 8    End of Session Equipment Utilized During Treatment: Gait belt Activity Tolerance: Patient limited by fatigue Patient left: in bed;with call bell/phone within reach;with bed alarm set Nurse Communication: Mobility status PT Visit Diagnosis: Difficulty in walking, not elsewhere classified (R26.2);Muscle weakness (generalized) (M62.81);Pain Pain - Right/Left: Right Pain - part of body: Hip     Time: 1610-9604 PT Time Calculation (min) (ACUTE ONLY): 20 min  Charges:  $Therapeutic Exercise: 8-22 mins                    Olga Coaster PT, DPT 2:53 PM,01/16/23

## 2023-01-16 NOTE — Progress Notes (Signed)
[facility] Progress Note  Patient: Jeanne [name]    ZOX:[id number]  DOA: 12/27/2022     Date of Service: the patient was seen and examined on 01/16/2023  Chief Complaint  Patient presents with   Diarrhea    Frequent diarrhea x 4 days, Having 3 to 5 episodes a day. no appetite w nausea and  has been having increased weakness. Recently discharged from [facility] hospital for C diff. Has been making attempts to stay hydrated.    Brief hospital course: PMH of rheumatoid arthritis, cervical cancer, HTN, CKD 3B presents to the hospital with complaints of diarrhea was found to have pancolitis with septic shock secondary to C. difficile infection. She never required IV vasopressors but was treated with IV fluid and IV albumin. Currently on midodrine. ID was added for C. difficile infection. Concerning for recurrent infection currently pulse therapy with vancomycin. Also had AKI nephrology following.    The patient has a rectal tube for C Diff colitis. She began having copious liquid stool again last night that caused the tube to come loose.    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 Presents with abdominal pain and diarrhea. Found to have pancolitis on CT abdomen. Recently treated for CAD prior to admission. Due to ongoing diarrhea ID was consulted with regards to concern for C. difficile reoccurrence. Currently on pulse dose vancomycin therapy. Unable to afford Dificid outside of the hospital. No benefit from low-dose Questran and had some itching after that. Was treated with IV fluid as well as IV albumin.  Never required IV pressors.  S/p IV hydrocortisone.  Currently on midodrine therapy. Blood pressure is significantly better.  Will monitor. 5/6 started probiotics twice daily   # Acute kidney injury on CKD 3B. # Anasarca, Resolved s/p IV Lasix #  Hypoalbuminemia. # Non-anion gap metabolic acidosis, Resolved Nephrology was consulted. S/p IV Lasix and bicarb was given. S/p IV albumin. Ultrasound shows evidence of mild bilateral hydronephrosis. Patient was on diclofenac which was discontinued due to her AKI. Lower extremity Doppler negative for any acute DVT but does show age-indeterminate nonocclusive SVT. 5/6 IV albumin every 6 hourly x 6 doses ordered Creatinine 1.24, gradually improving   # Acute HFpEF. Echocardiogram shows EF of 60-65%. No wall motion abnormality. S/p IV lasix   # Rheumatoid arthritis. Not on any therapy. Oxycodone as needed for pain control. Diclofenac was discontinued due to AKI.   # History of right total hip arthroplasty. Ongoing pain issues. Gets her care at [facility] Outpatient follow-up with Dr. Leontine Locket after discharge from the hospital.    # Bilateral hydronephrosis with nephrolithiasis. Outpatient follow-up with urology recommended.   # Normocytic anemia. Likely from poor p.o. intake. Monitor.   # Hypokalemia most likely due to diuretics.  Potassium repleted. # Hypomagnesemia, mag repleted and resolved. Monitor electrolytes and replete as needed.   # Itching. Suspected developed after Questran. No rash.  Continue Atarax as needed  # Vitamin D deficiency: started vitamin D 50,000 units p.o. weekly, follow with PCP to repeat vitamin D level after 3 to 6 months. # Vitamin B12 level 287, goal >400, started vitamin B12 oral supplement.   Body mass index is 26.41 kg/m.  Nutrition Problem: Inadequate oral intake Etiology: acute illness Interventions:      Diet: Regular diet DVT Prophylaxis: Subcutaneous Lovenox   Advance  goals of care discussion: Full code  Family Communication: family was not present at bedside, at the time of interview.  The pt provided permission to discuss medical plan with the family. Opportunity was given to ask question and all questions  were answered satisfactorily.   Disposition:  Pt is from Home, admitted with circulatory shock, C. difficile diarrhea, AKI, still has elevated creatinine and diarrhea, which precludes a safe discharge. Discharge to home, when clinically stable, may need few more days to improve.  Subjective: No significant events overnight, patient for rectal tube, having diarrhea, not sure how severe it is now but it seems lying down.  Patient denies any abdominal pain, no nausea vomiting.  No any other active issues, resting comfortably.   Physical Exam: General: NAD, lying comfortably Appear in no distress, affect appropriate Eyes: PERRLA ENT: Oral Mucosa Clear, moist  Neck: no JVD,  Cardiovascular: S1 and S2 Present, no Murmur,  Respiratory: good respiratory effort, Bilateral Air entry equal and Decreased, no Crackles, no wheezes Abdomen: Bowel Sound present, Soft and no tenderness, rectal tube intact Skin: no rashes Extremities: no Pedal edema, no calf tenderness Neurologic: without any new focal findings Gait not checked due to patient safety concerns  Vitals:   01/16/23 0510 01/16/23 0810 01/16/23 0825 01/16/23 1514  BP: (!) 106/50 (!) 96/41 (!) 131/57 (!) 104/52  Pulse: 71 76 76 74  Resp: 18 16  18   Temp: 98.3 F (36.8 C) 98 F (36.7 C)  98.4 F (36.9 C)  TempSrc:    Oral  SpO2: 95% 96%  99%  Weight:      Height:        Intake/Output Summary (Last 24 hours) at 01/16/2023 1559 Last data filed at 01/16/2023 8119 Gross per 24 hour  Intake 437.32 ml  Output 1100 ml  Net -662.68 ml   Filed Weights   01/11/23 0500 01/12/23 0412 01/14/23 0442  Weight: 70.3 kg 69.2 kg 69.8 kg    Data Reviewed: I have personally reviewed and interpreted daily labs, tele strips, imagings as discussed above. I reviewed all nursing notes, pharmacy notes, vitals, pertinent old records I have discussed plan of care as described above with RN and patient/family.  CBC: Recent Labs  Lab 01/13/23 0443  01/14/23 0519 01/15/23 0443 01/16/23 0619  WBC 9.5 7.7 5.8 3.8*  NEUTROABS 6.8 5.6  --   --   HGB 8.8* 9.1* 8.2* 7.9*  HCT 29.0* 29.3* 26.9* 26.4*  MCV 89.5 89.3 88.5 89.8  PLT 284 274 245 225   Basic Metabolic Panel: Recent Labs  Lab 01/11/23 0508 01/12/23 0424 01/13/23 0443 01/14/23 0519 01/15/23 0443 01/16/23 0619  NA 138 135 135 135 136 140  K 3.5 3.2* 3.6 3.4* 3.1* 3.7  CL 98 96* 96* 92* 93* 99  CO2 31 30 30  32 33* 32  GLUCOSE 99 104* 104* 105* 105* 92  BUN 40* 33* 34* 36* 37* 31*  CREATININE 1.08* 1.12* 1.15* 1.26* 1.31* 1.24*  CALCIUM 8.1* 8.0* 8.0* 8.3* 8.2* 8.6*  MG 1.8 2.0  --  1.9 1.9 2.0  PHOS 3.0 3.2  --  3.3 3.2 3.0    Studies: No results found.  Scheduled Meds:  (feeding supplement) PROSource Plus  30 mL Oral TID BM   vitamin C  500 mg Oral BID   vitamin B-12  1,000 mcg Oral Daily   enoxaparin (LOVENOX) injection  40 mg Subcutaneous Q24H   gabapentin  100 mg Oral QHS   Gerhardt's butt cream  Topical Daily   midodrine  5 mg Oral TID WC   multivitamin with minerals  1 tablet Oral Daily   nutrition supplement (JUVEN)  1 packet Oral BID BM   nystatin   Topical TID   saccharomyces boulardii  250 mg Oral BID   vancomycin  125 mg Oral BID   Followed by   Melene Muller ON 01/20/2023] vancomycin  125 mg Oral Daily   Followed by   Melene Muller ON 01/27/2023] vancomycin  125 mg Oral QODAY   Followed by   Melene Muller ON 02/04/2023] vancomycin  125 mg Oral Q3 days   vitamin A  10,000 Units Oral Daily   Vitamin D (Ergocalciferol)  50,000 Units Oral Q7 days   zinc sulfate  220 mg Oral Daily   Continuous Infusions:  sodium chloride Stopped (01/15/23 1042)   PRN Meds: acetaminophen, diphenhydrAMINE, ondansetron (ZOFRAN) IV, mouth rinse, oxyCODONE, polyethylene glycol, promethazine  Time spent: 40 minutes  Author: Gillis Santa. MD [facility] 01/16/2023 3:59 PM  To reach On-call, see care teams to locate the attending and reach out to them via [email or web address]. If  7PM-7AM, please contact night-coverage If you still have difficulty reaching the attending provider, please page the [facility] (Director on Call) for [facility] on amion for assistance.

## 2023-01-17 DIAGNOSIS — K5100 Ulcerative (chronic) pancolitis without complications: Secondary | ICD-10-CM | POA: Diagnosis not present

## 2023-01-17 LAB — BASIC METABOLIC PANEL
Anion gap: 10 (ref 5–15)
BUN: 24 mg/dL — ABNORMAL HIGH (ref 8–23)
CO2: 30 mmol/L (ref 22–32)
Calcium: 8.7 mg/dL — ABNORMAL LOW (ref 8.9–10.3)
Chloride: 97 mmol/L — ABNORMAL LOW (ref 98–111)
Creatinine, Ser: 1.3 mg/dL — ABNORMAL HIGH (ref 0.44–1.00)
GFR, Estimated: 44 mL/min — ABNORMAL LOW (ref >60)
Glucose, Bld: 118 mg/dL — ABNORMAL HIGH (ref 70–99)
Potassium: 3.7 mmol/L (ref 3.5–5.1)
Sodium: 137 mmol/L (ref 135–145)

## 2023-01-17 LAB — MAGNESIUM: Magnesium: 2 mg/dL (ref 1.7–2.4)

## 2023-01-17 LAB — PHOSPHORUS: Phosphorus: 3.6 mg/dL (ref 2.5–4.6)

## 2023-01-17 LAB — CBC
HCT: 30.6 % — ABNORMAL LOW (ref 36.0–46.0)
Hemoglobin: 9.3 g/dL — ABNORMAL LOW (ref 12.0–15.0)
MCH: 27.8 pg (ref 26.0–34.0)
MCHC: 30.4 g/dL (ref 30.0–36.0)
MCV: 91.3 fL (ref 80.0–100.0)
Platelets: 271 10*3/uL (ref 150–400)
RBC: 3.35 MIL/uL — ABNORMAL LOW (ref 3.87–5.11)
RDW: 16.4 % — ABNORMAL HIGH (ref 11.5–15.5)
WBC: 5.9 10*3/uL (ref 4.0–10.5)
nRBC: 0 % (ref 0.0–0.2)

## 2023-01-17 LAB — GLUCOSE, CAPILLARY: Glucose-Capillary: 94 mg/dL (ref 70–99)

## 2023-01-17 NOTE — Progress Notes (Signed)
[facility] Progress Note  Patient: Jeanne [name]    ZOX:[id number]  DOA: 12/27/2022     Date of Service: the patient was seen and examined on 01/17/2023  Chief Complaint  Patient presents with   Diarrhea    Frequent diarrhea x 4 days, Having 3 to 5 episodes a day. no appetite w nausea and  has been having increased weakness. Recently discharged from [facility] hospital for C diff. Has been making attempts to stay hydrated.    Brief hospital course: PMH of rheumatoid arthritis, cervical cancer, HTN, CKD 3B presents to the hospital with complaints of diarrhea was found to have pancolitis with septic shock secondary to C. difficile infection. She never required IV vasopressors but was treated with IV fluid and IV albumin. Currently on midodrine. ID was added for C. difficile infection. Concerning for recurrent infection currently pulse therapy with vancomycin. Also had AKI nephrology following.    The patient has a rectal tube for C Diff colitis. She began having copious liquid stool again last night that caused the tube to come loose.    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 Presents with abdominal pain and diarrhea. Found to have pancolitis on CT abdomen. Recently treated for CAD prior to admission. Due to ongoing diarrhea ID was consulted with regards to concern for C. difficile reoccurrence. Currently on pulse dose vancomycin therapy. Unable to afford Dificid outside of the hospital. No benefit from low-dose Questran and had some itching after that. Was treated with IV fluid as well as IV albumin.  Never required IV pressors.  S/p IV hydrocortisone.  Currently on midodrine therapy. Blood pressure is significantly better.  Will monitor. 5/6 started probiotics twice daily   # Acute kidney injury on CKD 3B. # Anasarca, Resolved s/p IV Lasix #  Hypoalbuminemia. # Non-anion gap metabolic acidosis, Resolved Nephrology was consulted. S/p IV Lasix and bicarb was given. S/p IV albumin. Ultrasound shows evidence of mild bilateral hydronephrosis. Patient was on diclofenac which was discontinued due to her AKI. Lower extremity Doppler negative for any acute DVT but does show age-indeterminate nonocclusive SVT. 5/6 IV albumin every 6 hourly x 6 doses ordered Creatinine 1.3 no significant improvement   # Acute HFpEF. Echocardiogram shows EF of 60-65%. No wall motion abnormality. S/p IV lasix   # Rheumatoid arthritis. Not on any therapy. Oxycodone as needed for pain control. Diclofenac was discontinued due to AKI.   # History of right total hip arthroplasty. Ongoing pain issues. Gets her care at [facility]. Outpatient follow-up with Dr. Leontine Locket after discharge from the hospital.    # Bilateral hydronephrosis with nephrolithiasis. Outpatient follow-up with urology recommended.   # Normocytic anemia. Likely from poor p.o. intake. Monitor.   # Hypokalemia most likely due to diuretics.  Potassium repleted. # Hypomagnesemia, mag repleted and resolved. Monitor electrolytes and replete as needed.   # Itching. Suspected developed after Questran. No rash.  Continue Atarax as needed  # Vitamin D deficiency: started vitamin D 50,000 units p.o. weekly, follow with PCP to repeat vitamin D level after 3 to 6 months. # Vitamin B12 level 287, goal >400, started vitamin B12 oral supplement.   Body mass index is 26.41 kg/m.  Nutrition Problem: Inadequate oral intake Etiology: acute illness Interventions:      Diet: Regular diet DVT Prophylaxis: Subcutaneous Lovenox  Advance goals of care discussion: Full code  Family Communication: family was not present at bedside, at the time of interview.  The pt provided permission to discuss medical plan with the family. Opportunity was given to ask question and all  questions were answered satisfactorily.   Disposition:  Pt is from Home, admitted with circulatory shock, C. difficile diarrhea, AKI, still has diarrhea and rectal tube, which precludes a safe discharge. Discharge to home, when clinically stable, may need few more days to improve.  Subjective: No significant events overnight, patient is a still having loose stools, so she would like to keep the rectal tube.  Denies any other complaints.  Physical Exam: General: NAD, lying comfortably Appear in no distress, affect appropriate Eyes: PERRLA ENT: Oral Mucosa Clear, moist  Neck: no JVD,  Cardiovascular: S1 and S2 Present, no Murmur,  Respiratory: good respiratory effort, Bilateral Air entry equal and Decreased, no Crackles, no wheezes Abdomen: Bowel Sound present, Soft and no tenderness, rectal tube intact Skin: no rashes Extremities: no Pedal edema, no calf tenderness Neurologic: without any new focal findings Gait not checked due to patient safety concerns  Vitals:   01/16/23 1925 01/17/23 0447 01/17/23 0500 01/17/23 0751  BP: (!) 106/57 (!) 101/56  (!) 101/57  Pulse: 76 88  86  Resp: 20 18  18   Temp: 99.2 F (37.3 C) 99.1 F (37.3 C)  98.9 F (37.2 C)  TempSrc: Oral Oral    SpO2: 99% 97%  98%  Weight:   70 kg   Height:        Intake/Output Summary (Last 24 hours) at 01/17/2023 1500 Last data filed at 01/17/2023 1327 Gross per 24 hour  Intake 480 ml  Output 530 ml  Net -50 ml   Filed Weights   01/12/23 0412 01/14/23 0442 01/17/23 0500  Weight: 69.2 kg 69.8 kg 70 kg    Data Reviewed: I have personally reviewed and interpreted daily labs, tele strips, imagings as discussed above. I reviewed all nursing notes, pharmacy notes, vitals, pertinent old records I have discussed plan of care as described above with RN and patient/family.  CBC: Recent Labs  Lab 01/13/23 0443 01/14/23 0519 01/15/23 0443 01/16/23 0619 01/17/23 0656  WBC 9.5 7.7 5.8 3.8* 5.9  NEUTROABS 6.8  5.6  --   --   --   HGB 8.8* 9.1* 8.2* 7.9* 9.3*  HCT 29.0* 29.3* 26.9* 26.4* 30.6*  MCV 89.5 89.3 88.5 89.8 91.3  PLT 284 274 245 225 271   Basic Metabolic Panel: Recent Labs  Lab 01/12/23 0424 01/13/23 0443 01/14/23 0519 01/15/23 0443 01/16/23 0619 01/17/23 0656  NA 135 135 135 136 140 137  K 3.2* 3.6 3.4* 3.1* 3.7 3.7  CL 96* 96* 92* 93* 99 97*  CO2 30 30 32 33* 32 30  GLUCOSE 104* 104* 105* 105* 92 118*  BUN 33* 34* 36* 37* 31* 24*  CREATININE 1.12* 1.15* 1.26* 1.31* 1.24* 1.30*  CALCIUM 8.0* 8.0* 8.3* 8.2* 8.6* 8.7*  MG 2.0  --  1.9 1.9 2.0 2.0  PHOS 3.2  --  3.3 3.2 3.0 3.6    Studies: No results found.  Scheduled Meds:  (feeding supplement) PROSource Plus  30 mL Oral TID BM   vitamin C  500 mg Oral BID   vitamin B-12  1,000 mcg Oral Daily   enoxaparin (LOVENOX) injection  40 mg Subcutaneous Q24H   gabapentin  100 mg Oral QHS   Gerhardt's butt cream   Topical Daily  midodrine  5 mg Oral TID WC   multivitamin with minerals  1 tablet Oral Daily   nutrition supplement (JUVEN)  1 packet Oral BID BM   nystatin   Topical TID   saccharomyces boulardii  250 mg Oral BID   vancomycin  125 mg Oral BID   Followed by   Melene Muller ON 01/20/2023] vancomycin  125 mg Oral Daily   Followed by   Melene Muller ON 01/27/2023] vancomycin  125 mg Oral QODAY   Followed by   Melene Muller ON 02/04/2023] vancomycin  125 mg Oral Q3 days   vitamin A  10,000 Units Oral Daily   Vitamin D (Ergocalciferol)  50,000 Units Oral Q7 days   zinc sulfate  220 mg Oral Daily   Continuous Infusions:  sodium chloride Stopped (01/15/23 1042)   PRN Meds: acetaminophen, diphenhydrAMINE, ondansetron (ZOFRAN) IV, mouth rinse, oxyCODONE, polyethylene glycol, promethazine  Time spent: 40 minutes  Author: Gillis Santa. MD [facility] 01/17/2023 3:00 PM  To reach On-call, see care teams to locate the attending and reach out to them via [email or web address]. If 7PM-7AM, please contact night-coverage If you still have  difficulty reaching the attending provider, please page the [facility] (Director on Call) for [facility] on amion for assistance.

## 2023-01-18 LAB — CBC
HCT: 29.8 % — ABNORMAL LOW (ref 36.0–46.0)
Hemoglobin: 9.1 g/dL — ABNORMAL LOW (ref 12.0–15.0)
MCH: 27.4 pg (ref 26.0–34.0)
MCHC: 30.5 g/dL (ref 30.0–36.0)
MCV: 89.8 fL (ref 80.0–100.0)
Platelets: 254 10*3/uL (ref 150–400)
RBC: 3.32 MIL/uL — ABNORMAL LOW (ref 3.87–5.11)
RDW: 16.1 % — ABNORMAL HIGH (ref 11.5–15.5)
WBC: 5.1 10*3/uL (ref 4.0–10.5)
nRBC: 0 % (ref 0.0–0.2)

## 2023-01-18 LAB — BASIC METABOLIC PANEL
Anion gap: 9 (ref 5–15)
BUN: 25 mg/dL — ABNORMAL HIGH (ref 8–23)
CO2: 28 mmol/L (ref 22–32)
Calcium: 8.7 mg/dL — ABNORMAL LOW (ref 8.9–10.3)
Chloride: 100 mmol/L (ref 98–111)
Creatinine, Ser: 1.28 mg/dL — ABNORMAL HIGH (ref 0.44–1.00)
GFR, Estimated: 45 mL/min — ABNORMAL LOW (ref >60)
Glucose, Bld: 98 mg/dL (ref 70–99)
Potassium: 3.6 mmol/L (ref 3.5–5.1)
Sodium: 137 mmol/L (ref 135–145)

## 2023-01-18 LAB — GLUCOSE, CAPILLARY: Glucose-Capillary: 100 mg/dL — ABNORMAL HIGH (ref 70–99)

## 2023-01-18 NOTE — Progress Notes (Addendum)
[facility] Progress Note  Patient: Jeanne [name]    ZOX:[id number]  DOA: 12/27/2022     Date of Service: the patient was seen and examined on 01/18/2023  Chief Complaint  Patient presents with   Diarrhea    Frequent diarrhea x 4 days, Having 3 to 5 episodes a day. no appetite w nausea and  has been having increased weakness. Recently discharged from [facility] hospital for C diff. Has been making attempts to stay hydrated.    Brief hospital course: PMH of rheumatoid arthritis, cervical cancer, HTN, CKD 3B presents to the hospital with complaints of diarrhea was found to have pancolitis with septic shock secondary to C. difficile infection. She never required IV vasopressors but was treated with IV fluid and IV albumin. Currently on midodrine. ID was added for C. difficile infection. Concerning for recurrent infection currently pulse therapy with vancomycin. Also had AKI nephrology was following.  S/p rectal tube, diarrhea is improving.   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 Presents with abdominal pain and diarrhea. Found to have pancolitis on CT abdomen. Recently treated for CAD prior to admission. Due to ongoing diarrhea ID was consulted with regards to concern for C. difficile reoccurrence. Currently on pulse dose vancomycin therapy. Unable to afford Dificid outside of the hospital. No benefit from low-dose Questran and had some itching after that. Was treated with IV fluid as well as IV albumin.  Never required IV pressors.  S/p IV hydrocortisone.  Currently on midodrine therapy. Blood pressure is significantly better.  Will monitor. 5/6 started probiotics twice daily   # Acute kidney injury on CKD 3B. # Anasarca, Resolved s/p IV Lasix # Hypoalbuminemia. # Non-anion gap metabolic acidosis, Resolved Nephrology was consulted. S/p IV Lasix and bicarb was given. S/p IV albumin. Ultrasound shows evidence of mild bilateral  hydronephrosis. Patient was on diclofenac which was discontinued due to her AKI. Lower extremity Doppler negative for any acute DVT but does show age-indeterminate nonocclusive SVT. 5/6 IV albumin every 6 hourly x 6 doses ordered Creatinine 1.3 no significant improvement   # Acute HFpEF. Echocardiogram shows EF of 60-65%. No wall motion abnormality. S/p IV lasix   # Rheumatoid arthritis. Not on any therapy. Oxycodone as needed for pain control. Diclofenac was discontinued due to AKI.   # History of right total hip arthroplasty. Ongoing pain issues. Gets her care at [facility]. Outpatient follow-up with Dr. Leontine Locket after discharge from the hospital.    # Bilateral hydronephrosis with nephrolithiasis. Outpatient follow-up with urology recommended.   # Normocytic anemia. Likely from poor p.o. intake. Monitor.   # Hypokalemia most likely due to diuretics.  Potassium repleted. # Hypomagnesemia, mag repleted and resolved. Monitor electrolytes and replete as needed.   # Itching. Suspected developed after Questran. No rash.  Continue Atarax as needed  # Vitamin D deficiency: started vitamin D 50,000 units p.o. weekly, follow with PCP to repeat vitamin D level after 3 to 6 months. # Vitamin B12 level 287, goal >400, started vitamin B12 oral supplement.   Body mass index is 26.41 kg/m.  Nutrition Problem: Inadequate oral intake Etiology: acute illness Interventions:      Diet: Regular diet DVT Prophylaxis: Subcutaneous Lovenox   Advance goals of care discussion: Full code  Family Communication: family was not present at bedside, at the time of interview.  The pt provided permission to discuss medical plan with the family. Opportunity was given to ask question and all questions  were answered satisfactorily.   Disposition:  Pt is from Home, admitted with circulatory shock, C. difficile diarrhea, AKI, diarrhea improved, s/p rectal tube, gradually improved,  stable to discharge tomorrow am. Discharge to home with [facility] most likely tomorrow a.m.  [facility] consulted for discharge planning.  Subjective: No significant events overnight, diarrhea is improving, rectal tube was discontinued early morning today.  No BM after that.  Denies any other active issues.  No abdominal pain, no nausea vomiting. We will continue to monitor today and plan for discharge tomorrow a.m.   Physical Exam: General: NAD, lying comfortably Appear in no distress, affect appropriate Eyes: PERRLA ENT: Oral Mucosa Clear, moist  Neck: no JVD,  Cardiovascular: S1 and S2 Present, no Murmur,  Respiratory: good respiratory effort, Bilateral Air entry equal and Decreased, no Crackles, no wheezes Abdomen: Bowel Sound present, Soft and no tenderness.  Skin: no rashes Extremities: no Pedal edema, no calf tenderness Neurologic: without any new focal findings Gait not checked due to patient safety concerns  Vitals:   01/17/23 2002 01/18/23 0420 01/18/23 0426 01/18/23 0927  BP: (!) 107/59 111/62  106/61  Pulse: 78 75  79  Resp: 18 16  18   Temp: 98.2 F (36.8 C) 98.1 F (36.7 C)  98.4 F (36.9 C)  TempSrc: Oral Oral    SpO2: 100% 98%  99%  Weight:   70.2 kg   Height:        Intake/Output Summary (Last 24 hours) at 01/18/2023 1633 Last data filed at 01/18/2023 1425 Gross per 24 hour  Intake 55 ml  Output 780 ml  Net -725 ml   Filed Weights   01/14/23 0442 01/17/23 0500 01/18/23 0426  Weight: 69.8 kg 70 kg 70.2 kg    Data Reviewed: I have personally reviewed and interpreted daily labs, tele strips, imagings as discussed above. I reviewed all nursing notes, pharmacy notes, vitals, pertinent old records I have discussed plan of care as described above with RN and patient/family.  CBC: Recent Labs  Lab 01/13/23 0443 01/14/23 0519 01/15/23 0443 01/16/23 0619 01/17/23 0656 01/18/23 0510  WBC 9.5 7.7 5.8 3.8* 5.9 5.1  NEUTROABS 6.8 5.6  --   --   --   --   HGB  8.8* 9.1* 8.2* 7.9* 9.3* 9.1*  HCT 29.0* 29.3* 26.9* 26.4* 30.6* 29.8*  MCV 89.5 89.3 88.5 89.8 91.3 89.8  PLT 284 274 245 225 271 254   Basic Metabolic Panel: Recent Labs  Lab 01/12/23 0424 01/13/23 0443 01/14/23 0519 01/15/23 0443 01/16/23 0619 01/17/23 0656 01/18/23 0510  NA 135   < > 135 136 140 137 137  K 3.2*   < > 3.4* 3.1* 3.7 3.7 3.6  CL 96*   < > 92* 93* 99 97* 100  CO2 30   < > 32 33* 32 30 28  GLUCOSE 104*   < > 105* 105* 92 118* 98  BUN 33*   < > 36* 37* 31* 24* 25*  CREATININE 1.12*   < > 1.26* 1.31* 1.24* 1.30* 1.28*  CALCIUM 8.0*   < > 8.3* 8.2* 8.6* 8.7* 8.7*  MG 2.0  --  1.9 1.9 2.0 2.0  --   PHOS 3.2  --  3.3 3.2 3.0 3.6  --    < > = values in this interval not displayed.    Studies: No results found.  Scheduled Meds:  (feeding supplement) PROSource Plus  30 mL Oral TID BM   vitamin C  500  mg Oral BID   vitamin B-12  1,000 mcg Oral Daily   enoxaparin (LOVENOX) injection  40 mg Subcutaneous Q24H   gabapentin  100 mg Oral QHS   Gerhardt's butt cream   Topical Daily   midodrine  5 mg Oral TID WC   multivitamin with minerals  1 tablet Oral Daily   nutrition supplement (JUVEN)  1 packet Oral BID BM   nystatin   Topical TID   saccharomyces boulardii  250 mg Oral BID   vancomycin  125 mg Oral BID   Followed by   Melene Muller ON 01/20/2023] vancomycin  125 mg Oral Daily   Followed by   Melene Muller ON 01/27/2023] vancomycin  125 mg Oral QODAY   Followed by   Melene Muller ON 02/04/2023] vancomycin  125 mg Oral Q3 days   vitamin A  10,000 Units Oral Daily   Vitamin D (Ergocalciferol)  50,000 Units Oral Q7 days   zinc sulfate  220 mg Oral Daily   Continuous Infusions:  sodium chloride Stopped (01/15/23 1042)   PRN Meds: acetaminophen, diphenhydrAMINE, ondansetron (ZOFRAN) IV, mouth rinse, oxyCODONE, polyethylene glycol, promethazine  Time spent: 40 minutes  Author: Gillis Santa. MD [facility] 01/18/2023 4:33 PM  To reach On-call, see care teams to locate the  attending and reach out to them via [email or web address]. If 7PM-7AM, please contact night-coverage If you still have difficulty reaching the attending provider, please page the [facility] (Director on Call) for [facility] on amion for assistance.

## 2023-01-18 NOTE — Progress Notes (Signed)
Physical Therapy Treatment Patient Details Name: Jeanne [name] MRN: [id number] DOB: 07-21-1951 Today's Date: 01/18/2023   History of Present Illness 72 y/o female recently hospitalized with C.Diff from 11/09/22-11/12/22, discharged with PO vancomycin. She states she completed this antibiotic course as prescribed. She reported that she did have resolution of her symptoms for a few weeks. She also had been COVID 19 + during this admission, but was asymptomatic and did not require treatment.  She reports that over the past week she has been nauseous and had poor PO intake, but denied vomiting. Over the last 4 days she has had multiple bouts of non-bloody diarrhea and bilateral lower quadrant abdominal pain/cramping. She reported these episodes seemed exactly the same in consistency and smell as during her previous C.Diff infection. She also reports generalized fatigue.    PT Comments    Pt resting in bed upon PT arrival and agreeable to LE ex's in bed.  Pt declining any OOB mobility d/t not feeling well in general and reports she will get OOB with home health therapist once she is at home and feeling better.  Pt tolerated LE ex's fairly well with assist as needed (pt reporting 8/10 generalized body pain and R knee pain during session--nurse notified of pt's request for pain meds).  Limited ex's per pt tolerance.   Recommendations for follow up therapy are one component of a multi-disciplinary discharge planning process, led by the attending physician.  Recommendations may be updated based on patient status, additional functional criteria and insurance authorization.  Follow Up Recommendations  Can patient physically be transported by private vehicle: No    Assistance Recommended at Discharge Frequent or constant Supervision/Assistance  Patient can return home with the following Two people to help with walking and/or transfers;A lot of help with bathing/dressing/bathroom;Assistance with  cooking/housework;Assist for transportation;Help with stairs or ramp for entrance   Equipment Recommendations  Other (comment) (Defer to next level of care)    Recommendations for Other Services       Precautions / Restrictions Precautions Precautions: Fall Restrictions Weight Bearing Restrictions: Yes RLE Weight Bearing: Partial weight bearing RLE Partial Weight Bearing Percentage or Pounds: 20% wb per report Other Position/Activity Restrictions: Pt reports PWB s/p THA in October.     Mobility  Bed Mobility               General bed mobility comments: Pt declined any OOB mobility    Transfers                        Ambulation/Gait                   Stairs             Wheelchair Mobility    Modified Rankin (Stroke Patients Only)       Balance                                            Cognition Arousal/Alertness: Awake/alert Behavior During Therapy: WFL for tasks assessed/performed Overall Cognitive Status: Within Functional Limits for tasks assessed                                          Exercises General Exercises - Lower Extremity Ankle  Circles/Pumps: AROM, Strengthening, Both, 10 reps, Supine Quad Sets: AROM, Strengthening, Both, 10 reps, Supine Gluteal Sets: AROM, Strengthening, Both, 10 reps, Supine Short Arc Quad: AROM, Strengthening, Both, 10 reps, Supine Heel Slides: AAROM, Strengthening, Both, 10 reps, Supine Hip ABduction/ADduction: AAROM, Strengthening, Both, 10 reps, Supine    General Comments  Pt agreeable to PT session.      Pertinent Vitals/Pain Pain Assessment Pain Assessment: 0-10 Pain Score: 8  Pain Location: general body pain and R knee pain Pain Descriptors / Indicators: Sore, Grimacing, Discomfort, Constant, Tender Pain Intervention(s): Limited activity within patient's tolerance, Monitored during session, Repositioned, Patient requesting pain meds-RN notified     Home Living                          Prior Function            PT Goals (current goals can now be found in the care plan section) Acute Rehab PT Goals Patient Stated Goal: "I just want to feel better so I can get going with my therapy." PT Goal Formulation: With patient Time For Goal Achievement: 01/28/23 Potential to Achieve Goals: Fair Progress towards PT goals: Progressing toward goals (with LE strengthening)    Frequency    Min 2X/week      PT Plan Current plan remains appropriate    Co-evaluation              AM-PAC PT "6 Clicks" Mobility   Outcome Measure  Help needed turning from your back to your side while in a flat bed without using bedrails?: A Lot Help needed moving from lying on your back to sitting on the side of a flat bed without using bedrails?: A Lot Help needed moving to and from a bed to a chair (including a wheelchair)?: Total Help needed standing up from a chair using your arms (e.g., wheelchair or bedside chair)?: Total Help needed to walk in hospital room?: Total Help needed climbing 3-5 steps with a railing? : Total 6 Click Score: 8    End of Session   Activity Tolerance: Patient limited by fatigue;Patient limited by pain Patient left: in bed;with call bell/phone within reach;with bed alarm set Nurse Communication: Mobility status;Precautions;Patient requests pain meds PT Visit Diagnosis: Difficulty in walking, not elsewhere classified (R26.2);Muscle weakness (generalized) (M62.81);Pain Pain - Right/Left: Right Pain - part of body: Knee     Time: 4098-1191 PT Time Calculation (min) (ACUTE ONLY): 17 min  Charges:  $Therapeutic Exercise: 8-22 mins                     Hendricks Limes, PT 01/18/23, 5:10 PM

## 2023-01-18 NOTE — TOC Progression Note (Signed)
Transition of Care [facility]) - Progression Note    Patient Details  Name: Jeanne [name] MRN: [id number] Date of Birth: 10/16/1950  [facility] CM/SW Contact  Chapman Fitch, RN Phone Number: 01/18/2023, 2:03 PM  Clinical Narrative:     Rectal tube removed. Patient confirms plan remains to return home with [facility] home health at discharge    Expected Discharge Plan: Home w Home Health Services Barriers to Discharge: Continued Medical Work up  Expected Discharge Plan and Services     Post Acute Care Choice: NA Living arrangements for the past 2 months: Single Family Home                                       Social Determinants of Health (SDOH) Interventions SDOH Screenings   Food Insecurity: No Food Insecurity (12/28/2022)  Housing: Low Risk  (12/28/2022)  Transportation Needs: No Transportation Needs (12/28/2022)  Utilities: Not At Risk (12/28/2022)  Alcohol Screen: Low Risk  (08/10/2020)  Depression (PHQ2-9): Low Risk  (08/10/2020)  Financial Resource Strain: Low Risk  (08/10/2020)  Physical Activity: Inactive (08/10/2020)  Social Connections: Moderately Integrated (08/10/2020)  Stress: No Stress Concern Present (08/10/2020)  Tobacco Use: Medium Risk (01/02/2023)    Readmission Risk Interventions    01/02/2023   12:24 PM  Readmission Risk Prevention Plan  Transportation Screening Complete  PCP or Specialist Appt within 3-5 Days Complete  Social Work Consult for Recovery Care Planning/Counseling Complete  Palliative Care Screening Not Applicable  Medication Review Oceanographer) Complete

## 2023-01-18 NOTE — Progress Notes (Signed)
Attempted to irrigate the fecal management system, due to a lack of bowel output this shift. Was able to instill 30mL, stopped at 30mL as patient began to feel pressure. Noticed that the incontinence pad appeared wet in the middle after irrigating. Assessed insertion site, noted water and scant amount of stool leaking out around the tube, but not into the fecal system itself. Noticed what little amount of stool in the fecal system was soft and malleable, but was more formed than liquid. Stool was stuck in place and did not move down the tube via gravity. Informed the patient that she is producing more formed stool and that the fecal system may be getting clogged up as a result. Encouraged removal of fecal system as it does not seem to be removing stool adequately. Patient agreed, fecal system was removed.

## 2023-01-18 NOTE — Progress Notes (Signed)
Attempted to place a midline in LUA x 2 with no success. Unable to thread wire,  vessels constricting. Poor vasculature. Pt currently has benadryl IPV PRN ordered via IV. Recommend PO benadryl at this time. RN aware.

## 2023-01-19 DIAGNOSIS — Z741 Need for assistance with personal care: Secondary | ICD-10-CM | POA: Diagnosis not present

## 2023-01-19 DIAGNOSIS — I959 Hypotension, unspecified: Secondary | ICD-10-CM | POA: Diagnosis not present

## 2023-01-19 DIAGNOSIS — Z743 Need for continuous supervision: Secondary | ICD-10-CM | POA: Diagnosis not present

## 2023-01-19 DIAGNOSIS — K5100 Ulcerative (chronic) pancolitis without complications: Secondary | ICD-10-CM | POA: Diagnosis not present

## 2023-01-19 LAB — CBC
HCT: 29.7 % — ABNORMAL LOW (ref 36.0–46.0)
Hemoglobin: 9 g/dL — ABNORMAL LOW (ref 12.0–15.0)
MCH: 27.4 pg (ref 26.0–34.0)
MCHC: 30.3 g/dL (ref 30.0–36.0)
MCV: 90.3 fL (ref 80.0–100.0)
Platelets: 279 10*3/uL (ref 150–400)
RBC: 3.29 MIL/uL — ABNORMAL LOW (ref 3.87–5.11)
RDW: 16.2 % — ABNORMAL HIGH (ref 11.5–15.5)
WBC: 5.4 10*3/uL (ref 4.0–10.5)
nRBC: 0 % (ref 0.0–0.2)

## 2023-01-19 LAB — BASIC METABOLIC PANEL
Anion gap: 7 (ref 5–15)
BUN: 23 mg/dL (ref 8–23)
CO2: 28 mmol/L (ref 22–32)
Calcium: 8.4 mg/dL — ABNORMAL LOW (ref 8.9–10.3)
Chloride: 100 mmol/L (ref 98–111)
Creatinine, Ser: 1.2 mg/dL — ABNORMAL HIGH (ref 0.44–1.00)
GFR, Estimated: 49 mL/min — ABNORMAL LOW (ref >60)
Glucose, Bld: 95 mg/dL (ref 70–99)
Potassium: 3.4 mmol/L — ABNORMAL LOW (ref 3.5–5.1)
Sodium: 135 mmol/L (ref 135–145)

## 2023-01-19 MED ORDER — VANCOMYCIN HCL 125 MG PO CAPS: 125.0000 mg | ORAL_CAPSULE | ORAL | 0 refills | Status: AC

## 2023-01-19 MED ORDER — VANCOMYCIN HCL 125 MG PO CAPS: 125.0000 mg | ORAL_CAPSULE | ORAL | 0 refills | Status: DC

## 2023-01-19 MED ORDER — CYANOCOBALAMIN 1000 MCG PO TABS: 1000.0000 ug | ORAL_TABLET | Freq: Every day | ORAL | 0 refills | Status: AC

## 2023-01-19 MED ORDER — VANCOMYCIN HCL 125 MG PO CAPS: 125.0000 mg | ORAL_CAPSULE | Freq: Every day | ORAL | 0 refills | Status: AC

## 2023-01-19 MED ORDER — DIPHENHYDRAMINE HCL 25 MG PO CAPS
25.0000 mg | ORAL_CAPSULE | Freq: Three times a day (TID) | ORAL | Status: DC | PRN
  Administered 2023-01-19: 25 mg via ORAL
  Filled 2023-01-19: qty 1

## 2023-01-19 MED ORDER — ZINC SULFATE 220 (50 ZN) MG PO CAPS: 220.0000 mg | ORAL_CAPSULE | Freq: Every day | ORAL | 0 refills | Status: AC

## 2023-01-19 MED ORDER — ADULT MULTIVITAMIN W/MINERALS CH: 1.0000 | ORAL_TABLET | Freq: Every day | ORAL | 2 refills | Status: AC

## 2023-01-19 MED ORDER — DIPHENHYDRAMINE HCL 25 MG PO CAPS
25.0000 mg | ORAL_CAPSULE | Freq: Every evening | ORAL | Status: DC | PRN
  Administered 2023-01-19: 25 mg via ORAL
  Filled 2023-01-19: qty 1

## 2023-01-19 MED ORDER — POTASSIUM CHLORIDE CRYS ER 20 MEQ PO TBCR
40.0000 meq | EXTENDED_RELEASE_TABLET | Freq: Once | ORAL | Status: AC
  Administered 2023-01-19: 40 meq via ORAL
  Filled 2023-01-19: qty 2

## 2023-01-19 MED ORDER — VITAMIN D (ERGOCALCIFEROL) 1.25 MG (50000 UNIT) PO CAPS: 50000.0000 [IU] | ORAL_CAPSULE | ORAL | 0 refills | Status: AC

## 2023-01-19 MED ORDER — OXYCODONE HCL 5 MG PO TABS: 5.0000 mg | ORAL_TABLET | Freq: Four times a day (QID) | ORAL | 0 refills | Status: AC | PRN

## 2023-01-19 MED ORDER — MIDODRINE HCL 5 MG PO TABS: 5.0000 mg | ORAL_TABLET | Freq: Three times a day (TID) | ORAL | 0 refills | Status: AC | PRN

## 2023-01-19 MED ORDER — SACCHAROMYCES BOULARDII 250 MG PO CAPS: 250.0000 mg | ORAL_CAPSULE | Freq: Two times a day (BID) | ORAL | 0 refills | Status: DC

## 2023-01-19 NOTE — Discharge Summary (Signed)
[facility] Discharge Summary   Patient: Jeanne [name] MWN:[id number]  PCP: Jerl Mina, MD  Date of admission: 12/27/2022   Date of discharge:  01/19/2023     Discharge Diagnoses:  Principal Problem:   Pancolitis [facility]) Active Problems:   Clostridium difficile diarrhea   COVID-19 virus infection   AKI (acute kidney injury) (HCC)   History of Clostridioides difficile colitis   Hypotension   CKD stage 3b, GFR 30-44 ml/min (HCC)   Hypomagnesemia   Hypokalemia   Admitted From: Home Disposition:  Home with home health services  Recommendations for Outpatient Follow-up:  F/u with PCP in 1 wk, repeat CBC and BMP in 1 wk F/u with Orthopedics as per schedule Follow up LABS/TEST:  BMP in 1 wk   Diet recommendation: Regular diet  Activity: The patient is advised to gradually reintroduce usual activities, as tolerated  Discharge Condition: stable  Code Status: Full code   History of present illness: As per the H and P dictated on admission Hospital Course:  PMH of rheumatoid arthritis, cervical cancer, HTN, CKD 3B presents to the hospital with complaints of diarrhea was found to have pancolitis with septic shock secondary to C. difficile infection. She never required IV vasopressors but was treated with IV fluid and IV albumin. Currently on midodrine. ID was added for C. difficile infection. Concerning for recurrent infection currently pulse therapy with vancomycin. Also had AKI nephrology was following.  S/p rectal tube, diarrhea is improving.    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 Presents with abdominal pain and diarrhea. Found to have pancolitis on CT abdomen. Recently treated for CAD prior to admission. Due to ongoing diarrhea ID was consulted with regards to concern for C. difficile reoccurrence.  Patient was started on pulse dose vancomycin therapy. Unable to afford Dificid outside of the hospital. No benefit from low-dose  Questran and had some itching after that.  Started probiotics. Pt was treated with IV fluid as well as IV albumin.  Never required IV pressors.  S/p IV hydrocortisone.  Started midodrine 5 mg p.o. 3 times daily as needed if SBP less than 100 mmHg. Blood pressure still remains soft, patient was advised to monitor BP at home and follow with PCP for further management.  Diarrhea resolved, patient had rectal tube which was discontinued. # Acute kidney injury on CKD 3B. # Anasarca, Resolved s/p IV Lasix # Hypoalbuminemia. # Non-anion gap metabolic acidosis, Resolved Nephrology was consulted. S/p IV Lasix and bicarb was given. S/p IV albumin. Ultrasound shows evidence of mild bilateral hydronephrosis. Patient was on diclofenac which was discontinued due to her AKI. Lower extremity Doppler negative for any acute DVT but does show age-indeterminate nonocclusive SVT. 5/6 IV albumin every 6 hourly x 6 doses given. Cr 1.2 improved. # Acute HFpEF. Echocardiogram shows EF of 60-65%. No wall motion abnormality. S/p IV lasix # Rheumatoid arthritis., Not on any therapy. Oxycodone as needed for pain control. Diclofenac was discontinued due to AKI. # History of right total hip arthroplasty: Ongoing pain issues. Gets her care at [facility]. Outpatient follow-up with Dr. Leontine Locket after discharge from the hospital.  # Bilateral hydronephrosis with nephrolithiasis: Outpatient follow-up with urology recommended. # Normocytic anemia: Likely from poor p.o. intake.  H&H remained stable. # Hypokalemia most likely due to diuretics.  Potassium repleted. # Hypomagnesemia, mag repleted and resolved. # Itching: Suspected developed after Questran. No rash. S/p Atarax and Benadryl as needed.   # Vitamin D deficiency:  started vitamin D 50,000 units p.o. weekly, follow with PCP to repeat vitamin D level after 3 to 6 months. # Vitamin B12 level 287, goal >400, started vitamin B12 oral supplement.  Body mass index is  26.57 kg/m.  Nutrition Problem: Inadequate oral intake Etiology: acute illness Nutrition Interventions:    Pain control  - [place] Controlled Substance Reporting System database was reviewed. -Prescribed oxycodone for 1 week - Patient was instructed, not to drive, operate heavy machinery, perform activities at heights, swimming or participation in water activities or provide baby sitting services while on Pain, Sleep and Anxiety Medications; until her outpatient Physician has advised to do so again.  - Also recommended to not to take more than prescribed Pain, Sleep and Anxiety Medications.  Patient was seen by physical therapy, who recommended Home health, which was arranged. On the day of the discharge the patient's vitals were stable, and no other acute medical condition were reported by patient. the patient was felt safe to be discharge at Home with Home health.  Consultants: Pulmonary critical care, nephrology, ID Procedures: None  Discharge Exam: General: Appear in no distress, no Rash; Oral Mucosa Clear, moist. Cardiovascular: S1 and S2 Present, no Murmur, Respiratory: normal respiratory effort, Bilateral Air entry present and no Crackles, no wheezes Abdomen: Bowel Sound present, Soft and no tenderness, no hernia Extremities: no Pedal edema, no calf tenderness Neurology: alert and oriented to time, place, and person affect appropriate.  Filed Weights   01/14/23 0442 01/17/23 0500 01/18/23 0426  Weight: 69.8 kg 70 kg 70.2 kg   Vitals:   01/19/23 0952 01/19/23 1037  BP:    Pulse:    Resp: 20 18  Temp:    SpO2:      DISCHARGE MEDICATION: Allergies as of 01/19/2023       Reactions   Codeine Nausea Only, Nausea And Vomiting   Augmentin [amoxicillin-pot Clavulanate] Nausea Only   Very nauseated/ feels sore on body         Medication List     STOP taking these medications    diclofenac 75 MG EC tablet Commonly known as: VOLTAREN   lactobacillus  acidophilus Tabs tablet   metoprolol tartrate 25 MG tablet Commonly known as: LOPRESSOR   ondansetron 8 MG disintegrating tablet Commonly known as: ZOFRAN-ODT       TAKE these medications    cyanocobalamin 1000 MCG tablet Take 1 tablet (1,000 mcg total) by mouth daily. Start taking on: Jan 20, 2023   gabapentin 100 MG capsule Commonly known as: NEURONTIN Take 100 mg by mouth at bedtime.   lidocaine-prilocaine cream Commonly known as: EMLA APPLY SPARINGLY 2-3 TIMES A DAY TO AREA OF RADIATION THERAPY IRRITATION.   midodrine 5 MG tablet Commonly known as: PROAMATINE Take 1 tablet (5 mg total) by mouth 3 (three) times daily as needed (Systolic BP less than 100 mmHg).   multivitamin with minerals Tabs tablet Take 1 tablet by mouth daily. Start taking on: Jan 20, 2023   oxyCODONE 5 MG immediate release tablet Commonly known as: Oxy IR/ROXICODONE Take 1 tablet (5 mg total) by mouth every 6 (six) hours as needed. What changed:  how much to take when to take this   promethazine 25 MG tablet Commonly known as: PHENERGAN TAKE 0.5 TABLETS (12.5 MG TOTAL) BY MOUTH EVERY 6 (SIX) HOURS AS NEEDED FOR NAUSEA OR VOMITING.   saccharomyces boulardii 250 MG capsule Commonly known as: FLORASTOR Take 1 capsule (250 mg total) by mouth 2 (two) times  daily for 28 days.   tretinoin 0.025 % cream Commonly known as: RETIN-A Apply topically at bedtime.   vancomycin 125 MG capsule Commonly known as: VANCOCIN Take 1 capsule (125 mg total) by mouth daily for 7 days. Start taking on: Jan 20, 2023   vancomycin 125 MG capsule Commonly known as: VANCOCIN Take 1 capsule (125 mg total) by mouth every other day for 4 doses. Start taking on: Jan 28, 2023   vancomycin 125 MG capsule Commonly known as: VANCOCIN Take 1 capsule (125 mg total) by mouth every 3 (three) days for 3 doses. Start taking on: Feb 06, 2023   Vitamin D (Ergocalciferol) 1.25 MG (50000 UNIT) Caps capsule Commonly known  as: DRISDOL Take 1 capsule (50,000 Units total) by mouth every 7 (seven) days. Start taking on: Jan 21, 2023   zinc sulfate 220 (50 Zn) MG capsule Take 1 capsule (220 mg total) by mouth daily for 28 days. Start taking on: Jan 20, 2023       Allergies  Allergen Reactions   Codeine Nausea Only and Nausea And Vomiting   Augmentin [Amoxicillin-Pot Clavulanate] Nausea Only    Very nauseated/ feels sore on body    Discharge Instructions     Call MD for:   Complete by: As directed    Recurrent diarrhea   Call MD for:  difficulty breathing, headache or visual disturbances   Complete by: As directed    Call MD for:  extreme fatigue   Complete by: As directed    Call MD for:  persistant dizziness or light-headedness   Complete by: As directed    Call MD for:  persistant nausea and vomiting   Complete by: As directed    Call MD for:  severe uncontrolled pain   Complete by: As directed    Call MD for:  temperature >100.4   Complete by: As directed    Diet - low sodium heart healthy   Complete by: As directed    Discharge instructions   Complete by: As directed    F/u with PCP in 1 wk, repeat CBC and BMP in 1 wk F/u with Orthopedics as per schedule   Face-to-face encounter (required for Medicare/Medicaid patients)   Complete by: As directed    I BERNARD AYIKU certify that this patient is under my care and that I, or a nurse practitioner or physician's assistant working with me, had a face-to-face encounter that meets the physician face-to-face encounter requirements with this patient on 01/07/2023. The encounter with the patient was in whole, or in part for the following medical condition(s) which is the primary reason for home health care (List medical condition): General weakness   The encounter with the patient was in whole, or in part, for the following medical condition, which is the primary reason for home health care: General weakness   I certify that, based on my findings, the  following services are medically necessary home health services: Physical therapy   Reason for Medically Necessary Home Health Services: Therapy- Investment banker, operational, Patent examiner   My clinical findings support the need for the above services: Unable to leave home safely without assistance and/or assistive device   Further, I certify that my clinical findings support that this patient is homebound due to: Unable to leave home safely without assistance   Home Health   Complete by: As directed    To provide the following care/treatments:  PT OT RN Home Health Aide  Increase activity slowly   Complete by: As directed    No wound care   Complete by: As directed        The results of significant diagnostics from this hospitalization (including imaging, microbiology, ancillary and laboratory) are listed below for reference.    Significant Diagnostic Studies: CT HIP RIGHT WO CONTRAST  Result Date: 01/03/2023 CLINICAL DATA:  Chronic right hip pain. Prior right hemiarthroplasty 07/02/2022. Right hip pain and swelling. EXAM: CT OF THE RIGHT HIP WITHOUT CONTRAST TECHNIQUE: Multidetector CT imaging of the right hip was performed according to the standard protocol. Multiplanar CT image reconstructions were also generated. RADIATION DOSE REDUCTION: This exam was performed according to the departmental dose-optimization program which includes automated exposure control, adjustment of the mA and/or kV according to patient size and/or use of iterative reconstruction technique. COMPARISON:  right hip radiographs 10/18/2021, CT abdomen pelvis 12/28/2022, right hip radiographs 10/18/2021, CT abdomen pelvis 07/18/2018 FINDINGS: Bones/Joint/Cartilage Postsurgical changes are again seen of total right hip arthroplasty, similar to recent 12/27/2022 CT and again representing a revision from more remote studies. The right acetabular cup again medially protrudes through the now completely absent  medial acetabular wall by approximately 6-8 mm. There is again a superior acetabular screw that extends up to approximately 3.7 cm anteriorly and superiorly into the right hemipelvis, and close to the posterior cecal wall (sagittal series 8, image 98). There is diffuse decreased bone mineralization. Within the limitation of metallic streak artifact, no definite perihardware lucency is seen to indicate hardware failure or loosening. There is severe anterior inferior right sacroiliac osteoarthritis with bone-on-bone contact and high-grade subchondral degenerative cysts. Moderate to severe pubic symphysis joint space narrowing, subchondral sclerosis, and peripheral osteophytosis. Within the limitation of diffuse decreased bone mineralization, no definitive acute fracture is seen. Ligaments Suboptimally assessed by CT. Muscles and Tendons There is mild fatty infiltration of the partially visualized right gluteus minimus and medius muscles and the vastus lateralis quadriceps muscle. No definitive tendon tear is seen. Soft tissues There is moderate edema and swelling of the subcutaneous fat of the visualized pelvis and proximal thigh diffusely, appearing increased from 12/27/2022. IMPRESSION: 1. Status post total right hip arthroplasty, similar to recent 12/27/2022 CT and again representing a revision from more remote studies. There is again a superior acetabular screw that extends up to approximately 3.7 cm anteriorly and superiorly into the right hemipelvis, and close to the posterior cecal wall. 2. Within the limitation of diffuse decreased bone mineralization, no definitive acute fracture is seen. 3. Severe anterior inferior right sacroiliac osteoarthritis. 4. 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 5. Moderate edema and swelling of the subcutaneous fat of the visualized pelvis and proximal thigh, appearing increased from 12/27/2022. Electronically Signed   By: Neita Garnet M.D.   On: 01/03/2023 19:40    US Venous Img Lower Bilateral (DVT)  Result Date: 01/03/2023 CLINICAL DATA:  Bilateral lower extremity pain and edema. Remote history of cervical cancer. Evaluate for DVT. EXAM: BILATERAL LOWER EXTREMITY VENOUS DOPPLER ULTRASOUND TECHNIQUE: Gray-scale sonography with graded compression, as well as color Doppler and duplex ultrasound were performed to evaluate the lower extremity deep venous systems from the level of the common femoral vein and including the common femoral, femoral, profunda femoral, popliteal and calf veins including the posterior tibial, peroneal and gastrocnemius veins when visible. The superficial great saphenous vein was also interrogated. Spectral Doppler was utilized to evaluate flow at rest and with distal augmentation maneuvers in the common femoral, femoral and popliteal veins.  COMPARISON:  None Available. FINDINGS: RIGHT LOWER EXTREMITY Common Femoral Vein: No evidence of thrombus. Normal compressibility, respiratory phasicity and response to augmentation. Saphenofemoral Junction: No evidence of thrombus. Normal compressibility and flow on color Doppler imaging. Profunda Femoral Vein: No evidence of thrombus. Normal compressibility and flow on color Doppler imaging. Femoral Vein: No evidence of thrombus. Normal compressibility, respiratory phasicity and response to augmentation. Popliteal Vein: No evidence of thrombus. Normal compressibility, respiratory phasicity and response to augmentation. Calf Veins: No evidence of thrombus. Normal compressibility and flow on color Doppler imaging. Superficial Great Saphenous Vein: There is age-indeterminate nonocclusive wall thickening/DVT involving the distal aspect of the greater saphenous vein (images 40 and 43). Other Findings: There is a minimal amount of subcutaneous edema at the level of the right lower leg and calf. LEFT LOWER EXTREMITY Common Femoral Vein: No evidence of thrombus. Normal compressibility, respiratory phasicity and  response to augmentation. Saphenofemoral Junction: No evidence of thrombus. Normal compressibility and flow on color Doppler imaging. Profunda Femoral Vein: No evidence of thrombus. Normal compressibility and flow on color Doppler imaging. Femoral Vein: No evidence of thrombus. Normal compressibility, respiratory phasicity and response to augmentation. Popliteal Vein: No evidence of thrombus. Normal compressibility, respiratory phasicity and response to augmentation. Calf Veins: No evidence of thrombus. Normal compressibility and flow on color Doppler imaging. Superficial Great Saphenous Vein: No evidence of thrombus. Normal compressibility. Other Findings: There is a minimal amount of subcutaneous edema at the level of the left calf. IMPRESSION: 1. No evidence of DVT within either lower extremity. 2. Age-indeterminate, though potentially chronic, nonocclusive wall thickening/SVT involving the distal aspect of the right greater saphenous vein. Clinical correlation is advised. Electronically Signed   By: Simonne Come M.D.   On: 01/03/2023 15:00   US RENAL  Result Date: 01/03/2023 CLINICAL DATA:  Acute kidney injury, history hypertension, cervical cancer EXAM: RENAL / URINARY TRACT ULTRASOUND COMPLETE COMPARISON:  CT abdomen and pelvis 12/28/2022 FINDINGS: Right Kidney: Renal measurements: 10.5 x 5.2 x 4.6 cm = volume: 132 mL. Marked cortical thinning. Upper normal cortical echogenicity. Mildly dilated renal collecting system. No mass or shadowing calcification. Left Kidney: Renal measurements: 10.0 x 5.3 x 4.6 cm = volume: 129 mL. Marked cortical thinning. Increased cortical echogenicity. Mild hydronephrosis. No definite mass or shadowing calcification. Bladder: Appears normal for degree of bladder distention. Other: Small amount of free fluid and small BILATERAL pleural effusions incidentally noted. Cholelithiasis. IMPRESSION: BILATERAL renal cortical thinning and medical renal disease changes. Mild BILATERAL  hydronephrosis. Small amount of ascites and small pleural effusions. Electronically Signed   By: Ulyses Southward M.D.   On: 01/03/2023 08:08   ECHOCARDIOGRAM COMPLETE  Result Date: 01/03/2023    ECHOCARDIOGRAM REPORT   Patient Name:   Jeanne [name] Date of Exam: 01/02/2023 Medical Rec #:  [id number]        Height:       64.0 in Accession #:    [id number]       Weight:       185.0 lb Date of Birth:  06-27-1951        BSA:          1.892 m Patient Age:    72 years         BP:           90/66 mmHg Patient Gender: F                HR:           85  bpm. Exam Location:  [facility] Procedure: 2D Echo, Cardiac Doppler and Color Doppler Indications:     I50.9 CHF  History:         Patient has no prior history of Echocardiogram examinations.                  Risk Factors:Hypertension. Chronic kidney disease.  Sonographer:     Daphine Deutscher RDCS Referring Phys:  [id number] Lurene Shadow Diagnosing Phys: Julien Nordmann MD IMPRESSIONS  1. Left ventricular ejection fraction, by estimation, is 60 to 65%. The left ventricle has normal function. The left ventricle has no regional wall motion abnormalities. Left ventricular diastolic parameters were normal.  2. Right ventricular systolic function is normal. The right ventricular size is normal. There is normal pulmonary artery systolic pressure. The estimated right ventricular systolic pressure is 26.5 mmHg.  3. The mitral valve is normal in structure. Mild mitral valve regurgitation. No evidence of mitral stenosis.  4. The aortic valve has an indeterminant number of cusps. Aortic valve regurgitation is not visualized. No aortic stenosis is present.  5. The inferior vena cava is normal in size with greater than 50% respiratory variability, suggesting right atrial pressure of 3 mmHg. FINDINGS  Left Ventricle: Left ventricular ejection fraction, by estimation, is 60 to 65%. The left ventricle has normal function. The left ventricle has no regional wall motion abnormalities. The left  ventricular internal cavity size was normal in size. There is  no left ventricular hypertrophy. Left ventricular diastolic parameters were normal. Right Ventricle: The right ventricular size is normal. No increase in right ventricular wall thickness. Right ventricular systolic function is normal. There is normal pulmonary artery systolic pressure. The tricuspid regurgitant velocity is 2.32 m/s, and  with an assumed right atrial pressure of 5 mmHg, the estimated right ventricular systolic pressure is 26.5 mmHg. Left Atrium: Left atrial size was normal in size. Right Atrium: Right atrial size was normal in size. Pericardium: There is no evidence of pericardial effusion. Mitral Valve: The mitral valve is normal in structure. Mild mitral valve regurgitation. No evidence of mitral valve stenosis. Tricuspid Valve: The tricuspid valve is normal in structure. Tricuspid valve regurgitation is not demonstrated. No evidence of tricuspid stenosis. Aortic Valve: The aortic valve has an indeterminant number of cusps. Aortic valve regurgitation is not visualized. No aortic stenosis is present. Pulmonic Valve: The pulmonic valve was normal in structure. Pulmonic valve regurgitation is not visualized. No evidence of pulmonic stenosis. Aorta: The aortic root is normal in size and structure. Venous: The inferior vena cava is normal in size with greater than 50% respiratory variability, suggesting right atrial pressure of 3 mmHg. IAS/Shunts: No atrial level shunt detected by color flow Doppler.  LEFT VENTRICLE PLAX 2D LVIDd:         3.90 cm   Diastology LVIDs:         2.20 cm   LV e' medial:    9.70 cm/s LV PW:         0.70 cm   LV E/e' medial:  9.4 LV IVS:        0.90 cm   LV e' lateral:   8.33 cm/s LVOT diam:     1.60 cm   LV E/e' lateral: 10.9 LV SV:         61 LV SV Index:   32 LVOT Area:     2.01 cm  RIGHT VENTRICLE             IVC RV Basal  diam:  4.40 cm     IVC diam: 1.20 cm RV S prime:     15.30 cm/s TAPSE (M-mode): 3.0 cm  LEFT ATRIUM             Index        RIGHT ATRIUM           Index LA diam:        3.50 cm 1.85 cm/m   RA Area:     13.10 cm LA Vol (A2C):   22.2 ml 11.73 ml/m  RA Volume:   32.70 ml  17.28 ml/m LA Vol (A4C):   24.9 ml 13.16 ml/m LA Biplane Vol: 24.2 ml 12.79 ml/m  AORTIC VALVE LVOT Vmax:   134.00 cm/s LVOT Vmean:  93.200 cm/s LVOT VTI:    0.305 m  AORTA Ao Root diam: 3.60 cm MITRAL VALVE               TRICUSPID VALVE MV Area (PHT): 3.53 cm    TR Peak grad:   21.5 mmHg MV Decel Time: 215 msec    TR Vmax:        232.00 cm/s MV E velocity: 90.70 cm/s MV A velocity: 85.40 cm/s  SHUNTS MV E/A ratio:  1.06        Systemic VTI:  0.30 m                            Systemic Diam: 1.60 cm Julien Nordmann MD Electronically signed by Julien Nordmann MD Signature Date/Time: 01/03/2023/7:54:11 AM    Final    CT ABDOMEN PELVIS W CONTRAST  Result Date: 12/28/2022 CLINICAL DATA:  Diarrhea, abdominal pain EXAM: CT ABDOMEN AND PELVIS WITH CONTRAST TECHNIQUE: Multidetector CT imaging of the abdomen and pelvis was performed using the standard protocol following bolus administration of intravenous contrast. RADIATION DOSE REDUCTION: This exam was performed according to the departmental dose-optimization program which includes automated exposure control, adjustment of the mA and/or kV according to patient size and/or use of iterative reconstruction technique. CONTRAST:  75mL OMNIPAQUE IOHEXOL 300 MG/ML  SOLN COMPARISON:  07/28/2018 FINDINGS: Lower chest: Lung bases are clear. Hepatobiliary: Liver is within normal limits. Layering small gallstones (series 2/image 19), without associated inflammatory changes. No intrahepatic or extrahepatic duct dilatation. Pancreas: Within normal limits Spleen: At the upper limits of normal for size put Adrenals/Urinary Tract: Adrenal glands are within normal limits. Right renal cortical scarring. Nonobstructing renal calculi measuring up to 2-3 mm. Renal sinus cysts without frank hydronephrosis.  Severe left renal atrophy, progressive. Moderate left hydroureteronephrosis, chronic. No convincing ureteral calculus. Bladder is grossly unremarkable though partially obscured by streak artifact. Stomach/Bowel: Stomach is within normal limits. No evidence of bowel obstruction. Appendix is not discretely visualized. Wall thickening/inflammatory changes extending from the cecum to the rectum, suggesting infectious/inflammatory pancolitis. No pneumatosis. No drainable fluid collection/abscess.  No free air. Vascular/Lymphatic: No evidence of abdominal aortic aneurysm. No suspicious abdominopelvic lymphadenopathy. Reproductive: Status post hysterectomy. No adnexal masses. Other: No abdominopelvic ascites. Musculoskeletal: Status post right hip arthroplasty with revision from the prior. An acetabular screw extends into the right lower pelvis (series 2/image 53). Left hip arthroplasty. Degenerative changes of the thoracolumbar spine with stable moderate compression fracture deformity at T12, stable mild inferior endplate changes at L1, and new moderate central compression fracture deformity at L5 (although likely chronic). IMPRESSION: Infectious/inflammatory pancolitis. No drainable fluid collection/abscess. No free air. Additional ancillary findings as above. Electronically Signed  By: Charline Bills M.D.   On: 12/28/2022 00:12    Microbiology: No results found for this or any previous visit (from the past 240 hour(s)).   Labs: CBC: Recent Labs  Lab 01/13/23 0443 01/14/23 0519 01/15/23 0443 01/16/23 0619 01/17/23 0656 01/18/23 0510 01/19/23 0536  WBC 9.5 7.7 5.8 3.8* 5.9 5.1 5.4  NEUTROABS 6.8 5.6  --   --   --   --   --   HGB 8.8* 9.1* 8.2* 7.9* 9.3* 9.1* 9.0*  HCT 29.0* 29.3* 26.9* 26.4* 30.6* 29.8* 29.7*  MCV 89.5 89.3 88.5 89.8 91.3 89.8 90.3  PLT 284 274 245 225 271 254 279   Basic Metabolic Panel: Recent Labs  Lab 01/14/23 0519 01/15/23 0443 01/16/23 0619 01/17/23 0656  01/18/23 0510 01/19/23 0536  NA 135 136 140 137 137 135  K 3.4* 3.1* 3.7 3.7 3.6 3.4*  CL 92* 93* 99 97* 100 100  CO2 32 33* 32 30 28 28   GLUCOSE 105* 105* 92 118* 98 95  BUN 36* 37* 31* 24* 25* 23  CREATININE 1.26* 1.31* 1.24* 1.30* 1.28* 1.20*  CALCIUM 8.3* 8.2* 8.6* 8.7* 8.7* 8.4*  MG 1.9 1.9 2.0 2.0  --   --   PHOS 3.3 3.2 3.0 3.6  --   --    Liver Function Tests: Recent Labs  Lab 01/15/23 0443  AST 21  ALT 13  ALKPHOS 87  BILITOT 0.9  PROT 5.6*  ALBUMIN 2.9*   No results for input(s): "LIPASE", "AMYLASE" in the last 168 hours. No results for input(s): "AMMONIA" in the last 168 hours. Cardiac Enzymes: No results for input(s): "CKTOTAL", "CKMB", "CKMBINDEX", "TROPONINI" in the last 168 hours. BNP (last 3 results) No results for input(s): "BNP" in the last 8760 hours. CBG: Recent Labs  Lab 01/13/23 0638 01/14/23 0526 01/15/23 0547 01/17/23 0533 01/18/23 0422  GLUCAP 98 92 102* 94 100*    Time spent: 35 minutes  Signed:  Gillis Santa  [facility] 01/19/2023 12:32 PM

## 2023-01-19 NOTE — TOC Transition Note (Signed)
Transition of Care [facility]) - CM/SW Discharge Note   Patient Details  Name: Jeanne [name] MRN: [id number] Date of Birth: 02-10-1951  Transition of Care [facility]) CM/SW Contact:  Bing Quarry, RN Phone Number: 01/19/2023, 12:35 PM   Clinical Narrative: 01/19/23: Patient to be discharged today to home with resumption of HH via [facility]. Has needed DME at home per prior CM assessment.  EMS to transport to home.   Contact number: Frannie, Avery (Spouse)  [phone] (Mobile)   Gabriel Cirri RN CM     Final next level of care: Home w Home Health Services Barriers to Discharge: Barriers Resolved   Patient Goals and CMS Choice      Discharge Placement                  Patient to be transferred to facility by:  Andria Frames (Spouse) [phone] (Mobile))   Patient and family notified of of transfer: 01/19/23  Discharge Plan and Services Additional resources added to the After Visit Summary for       Post Acute Care Choice: NA          DME Arranged:  (Has needed DME at home PTA) DME Agency: NA       HH Arranged: PT, OT, Nurse's Aide HH Agency: [facility] Date [facility] Agency Contacted:  (By prior CM.)      Social Determinants of Health (SDOH) Interventions SDOH Screenings   Food Insecurity: No Food Insecurity (12/28/2022)  Housing: Low Risk  (12/28/2022)  Transportation Needs: No Transportation Needs (12/28/2022)  Utilities: Not At Risk (12/28/2022)  Alcohol Screen: Low Risk  (08/10/2020)  Depression (PHQ2-9): Low Risk  (08/10/2020)  Financial Resource Strain: Low Risk  (08/10/2020)  Physical Activity: Inactive (08/10/2020)  Social Connections: Moderately Integrated (08/10/2020)  Stress: No Stress Concern Present (08/10/2020)  Tobacco Use: Medium Risk (01/02/2023)     Readmission Risk Interventions    01/02/2023   12:24 PM  Readmission Risk Prevention Plan  Transportation Screening Complete  PCP or Specialist Appt within 3-5 Days Complete  Social Work Consult  for Recovery Care Planning/Counseling Complete  Palliative Care Screening Not Applicable  Medication Review Oceanographer) Complete

## 2023-01-20 DIAGNOSIS — Z96649 Presence of unspecified artificial hip joint: Secondary | ICD-10-CM | POA: Diagnosis not present

## 2023-01-21 ENCOUNTER — Telehealth: Payer: Self-pay

## 2023-01-21 DIAGNOSIS — N1832 Chronic kidney disease, stage 3b: Secondary | ICD-10-CM

## 2023-01-21 NOTE — Consult Note (Signed)
   [facility] Inpatient Consult   01/21/2023  Jeanne [name] [birth year] [id number]    Location: [facility] RN Hospital Liaison remote coordination call. Spoke to spouse Jeanne [name]) concerning [name] services for care coordination with [facility] RN. Spouse very receptive. Spouse also inquired about [facility] with [facility] who has not called pt yet for services. Note pt discharged on 01/19/2023 via EPIC verification.   Plan: Offered to contact [facility] to follow up with services. Will refer to [facility] for care coordination services as requested.  [facility] Care Management/Population Health does not replace or interfere with any arrangements made by the Inpatient Transition of Care team.   For questions contact:   Elliot Cousin, RN, BSN [facility] Liaison    [facility]  Population Health Office Hours MTWF 8:00 am to 6 pm off on Thursday [phone] mobile [phone] [Office toll free line][facility] Office Hours are M-F 8:30 - 5 pm 24 hour nurse advise line [phone] Conceirge  [name]

## 2023-01-21 NOTE — Care Management Important Message (Signed)
Important Message  Patient Details  Name: Jeanne [name] MRN: [id number] Date of Birth: [birth year]   Medicare Important Message Given:  Yes  Late entry - Reviewed Important Message from Medicare on Friday, 5/10 buy forgot to document it.    Olegario Messier A Walther Sanagustin 01/21/2023, 11:18 AM

## 2023-01-22 ENCOUNTER — Telehealth: Payer: Self-pay | Admitting: *Deleted

## 2023-01-22 DIAGNOSIS — N132 Hydronephrosis with renal and ureteral calculous obstruction: Secondary | ICD-10-CM | POA: Diagnosis not present

## 2023-01-22 DIAGNOSIS — N179 Acute kidney failure, unspecified: Secondary | ICD-10-CM | POA: Diagnosis not present

## 2023-01-22 DIAGNOSIS — L299 Pruritus, unspecified: Secondary | ICD-10-CM | POA: Diagnosis not present

## 2023-01-22 DIAGNOSIS — N1832 Chronic kidney disease, stage 3b: Secondary | ICD-10-CM | POA: Diagnosis not present

## 2023-01-22 DIAGNOSIS — I130 Hypertensive heart and chronic kidney disease with heart failure and stage 1 through stage 4 chronic kidney disease, or unspecified chronic kidney disease: Secondary | ICD-10-CM | POA: Diagnosis not present

## 2023-01-22 DIAGNOSIS — I2510 Atherosclerotic heart disease of native coronary artery without angina pectoris: Secondary | ICD-10-CM | POA: Diagnosis not present

## 2023-01-22 DIAGNOSIS — I5030 Unspecified diastolic (congestive) heart failure: Secondary | ICD-10-CM | POA: Diagnosis not present

## 2023-01-22 DIAGNOSIS — E559 Vitamin D deficiency, unspecified: Secondary | ICD-10-CM | POA: Diagnosis not present

## 2023-01-22 DIAGNOSIS — M24541 Contracture, right hand: Secondary | ICD-10-CM | POA: Diagnosis not present

## 2023-01-22 DIAGNOSIS — M24542 Contracture, left hand: Secondary | ICD-10-CM | POA: Diagnosis not present

## 2023-01-22 DIAGNOSIS — K529 Noninfective gastroenteritis and colitis, unspecified: Secondary | ICD-10-CM | POA: Diagnosis not present

## 2023-01-22 DIAGNOSIS — Z79891 Long term (current) use of opiate analgesic: Secondary | ICD-10-CM | POA: Diagnosis not present

## 2023-01-22 DIAGNOSIS — G8929 Other chronic pain: Secondary | ICD-10-CM | POA: Diagnosis not present

## 2023-01-22 DIAGNOSIS — E876 Hypokalemia: Secondary | ICD-10-CM | POA: Diagnosis not present

## 2023-01-22 DIAGNOSIS — Z8541 Personal history of malignant neoplasm of cervix uteri: Secondary | ICD-10-CM | POA: Diagnosis not present

## 2023-01-22 DIAGNOSIS — E538 Deficiency of other specified B group vitamins: Secondary | ICD-10-CM | POA: Diagnosis not present

## 2023-01-22 DIAGNOSIS — I959 Hypotension, unspecified: Secondary | ICD-10-CM | POA: Diagnosis not present

## 2023-01-22 DIAGNOSIS — A0472 Enterocolitis due to Clostridium difficile, not specified as recurrent: Secondary | ICD-10-CM | POA: Diagnosis not present

## 2023-01-22 DIAGNOSIS — U071 COVID-19: Secondary | ICD-10-CM | POA: Diagnosis not present

## 2023-01-22 DIAGNOSIS — M25572 Pain in left ankle and joints of left foot: Secondary | ICD-10-CM | POA: Diagnosis not present

## 2023-01-22 DIAGNOSIS — I10 Essential (primary) hypertension: Secondary | ICD-10-CM | POA: Diagnosis not present

## 2023-01-22 DIAGNOSIS — Z8616 Personal history of COVID-19: Secondary | ICD-10-CM | POA: Diagnosis not present

## 2023-01-22 DIAGNOSIS — R944 Abnormal results of kidney function studies: Secondary | ICD-10-CM | POA: Diagnosis not present

## 2023-01-22 DIAGNOSIS — M436 Torticollis: Secondary | ICD-10-CM | POA: Diagnosis not present

## 2023-01-22 DIAGNOSIS — I5031 Acute diastolic (congestive) heart failure: Secondary | ICD-10-CM | POA: Diagnosis not present

## 2023-01-22 DIAGNOSIS — M0800 Unspecified juvenile rheumatoid arthritis of unspecified site: Secondary | ICD-10-CM | POA: Diagnosis not present

## 2023-01-22 DIAGNOSIS — Z96641 Presence of right artificial hip joint: Secondary | ICD-10-CM | POA: Diagnosis not present

## 2023-01-22 DIAGNOSIS — Z993 Dependence on wheelchair: Secondary | ICD-10-CM | POA: Diagnosis not present

## 2023-01-22 DIAGNOSIS — D631 Anemia in chronic kidney disease: Secondary | ICD-10-CM | POA: Diagnosis not present

## 2023-01-22 NOTE — Progress Notes (Signed)
  Care Coordination  Outreach Note  01/22/2023 Name: MAQUITA MUNDA MRN: [id number] DOB: August 07, 1951   Care Coordination Outreach Attempts: An unsuccessful telephone outreach was attempted today to offer the patient information about available care coordination services.  Follow Up Plan:  Additional outreach attempts will be made to offer the patient care coordination information and services.   Encounter Outcome:  No Answer  Burman Nieves, CCMA Care Coordination Care Guide Direct Dial: [phone]

## 2023-01-24 NOTE — Progress Notes (Signed)
  Care Coordination  Outreach Note  01/24/2023 Name: Jeanne [name] MRN: [id number] DOB: Oct 26, 1950   Care Coordination Outreach Attempts: A second unsuccessful outreach was attempted today to offer the patient with information about available care coordination services.  Follow Up Plan:  Additional outreach attempts will be made to offer the patient care coordination information and services.   Encounter Outcome:  No Answer  Burman Nieves, CCMA Care Coordination Care Guide Direct Dial: [phone]

## 2023-01-25 DIAGNOSIS — M19071 Primary osteoarthritis, right ankle and foot: Secondary | ICD-10-CM | POA: Diagnosis not present

## 2023-01-25 DIAGNOSIS — Z96643 Presence of artificial hip joint, bilateral: Secondary | ICD-10-CM | POA: Diagnosis not present

## 2023-01-25 DIAGNOSIS — M05771 Rheumatoid arthritis with rheumatoid factor of right ankle and foot without organ or systems involvement: Secondary | ICD-10-CM | POA: Diagnosis not present

## 2023-01-25 DIAGNOSIS — G8929 Other chronic pain: Secondary | ICD-10-CM | POA: Diagnosis not present

## 2023-01-25 DIAGNOSIS — M25571 Pain in right ankle and joints of right foot: Secondary | ICD-10-CM | POA: Diagnosis not present

## 2023-01-25 DIAGNOSIS — A0472 Enterocolitis due to Clostridium difficile, not specified as recurrent: Secondary | ICD-10-CM | POA: Diagnosis not present

## 2023-01-25 DIAGNOSIS — M85851 Other specified disorders of bone density and structure, right thigh: Secondary | ICD-10-CM | POA: Diagnosis not present

## 2023-01-25 DIAGNOSIS — Z471 Aftercare following joint replacement surgery: Secondary | ICD-10-CM | POA: Diagnosis not present

## 2023-01-25 DIAGNOSIS — Z96641 Presence of right artificial hip joint: Secondary | ICD-10-CM | POA: Diagnosis not present

## 2023-01-25 DIAGNOSIS — N390 Urinary tract infection, site not specified: Secondary | ICD-10-CM | POA: Diagnosis not present

## 2023-01-25 DIAGNOSIS — T8450XD Infection and inflammatory reaction due to unspecified internal joint prosthesis, subsequent encounter: Secondary | ICD-10-CM | POA: Diagnosis not present

## 2023-01-25 DIAGNOSIS — M85871 Other specified disorders of bone density and structure, right ankle and foot: Secondary | ICD-10-CM | POA: Diagnosis not present

## 2023-01-25 NOTE — Progress Notes (Signed)
  Care Coordination  Outreach Note  01/25/2023 Name: Jeanne [name] MRN: [id number] DOB: 08/10/1951   Care Coordination Outreach Attempts: A third unsuccessful outreach was attempted today to offer the patient with information about available care coordination services.  Follow Up Plan:  No further outreach attempts will be made at this time. We have been unable to contact the patient to offer or enroll patient in care coordination services  Encounter Outcome:  No Answer  Burman Nieves, [facility] Care Coordination Care Guide Direct Dial: [phone]

## 2023-01-30 DIAGNOSIS — N1832 Chronic kidney disease, stage 3b: Secondary | ICD-10-CM | POA: Diagnosis not present

## 2023-01-30 DIAGNOSIS — I959 Hypotension, unspecified: Secondary | ICD-10-CM | POA: Diagnosis not present

## 2023-01-30 DIAGNOSIS — A0472 Enterocolitis due to Clostridium difficile, not specified as recurrent: Secondary | ICD-10-CM | POA: Diagnosis not present

## 2023-01-30 DIAGNOSIS — N132 Hydronephrosis with renal and ureteral calculous obstruction: Secondary | ICD-10-CM | POA: Diagnosis not present

## 2023-01-30 DIAGNOSIS — I2510 Atherosclerotic heart disease of native coronary artery without angina pectoris: Secondary | ICD-10-CM | POA: Diagnosis not present

## 2023-01-30 DIAGNOSIS — I130 Hypertensive heart and chronic kidney disease with heart failure and stage 1 through stage 4 chronic kidney disease, or unspecified chronic kidney disease: Secondary | ICD-10-CM | POA: Diagnosis not present

## 2023-01-30 DIAGNOSIS — N179 Acute kidney failure, unspecified: Secondary | ICD-10-CM | POA: Diagnosis not present

## 2023-01-30 DIAGNOSIS — I5031 Acute diastolic (congestive) heart failure: Secondary | ICD-10-CM | POA: Diagnosis not present

## 2023-01-30 DIAGNOSIS — D631 Anemia in chronic kidney disease: Secondary | ICD-10-CM | POA: Diagnosis not present

## 2023-01-30 DIAGNOSIS — U071 COVID-19: Secondary | ICD-10-CM | POA: Diagnosis not present

## 2023-02-01 DIAGNOSIS — I5031 Acute diastolic (congestive) heart failure: Secondary | ICD-10-CM | POA: Diagnosis not present

## 2023-02-01 DIAGNOSIS — A0472 Enterocolitis due to Clostridium difficile, not specified as recurrent: Secondary | ICD-10-CM | POA: Diagnosis not present

## 2023-02-01 DIAGNOSIS — I2510 Atherosclerotic heart disease of native coronary artery without angina pectoris: Secondary | ICD-10-CM | POA: Diagnosis not present

## 2023-02-01 DIAGNOSIS — N1832 Chronic kidney disease, stage 3b: Secondary | ICD-10-CM | POA: Diagnosis not present

## 2023-02-01 DIAGNOSIS — D631 Anemia in chronic kidney disease: Secondary | ICD-10-CM | POA: Diagnosis not present

## 2023-02-01 DIAGNOSIS — N179 Acute kidney failure, unspecified: Secondary | ICD-10-CM | POA: Diagnosis not present

## 2023-02-01 DIAGNOSIS — N132 Hydronephrosis with renal and ureteral calculous obstruction: Secondary | ICD-10-CM | POA: Diagnosis not present

## 2023-02-01 DIAGNOSIS — I130 Hypertensive heart and chronic kidney disease with heart failure and stage 1 through stage 4 chronic kidney disease, or unspecified chronic kidney disease: Secondary | ICD-10-CM | POA: Diagnosis not present

## 2023-02-01 DIAGNOSIS — I959 Hypotension, unspecified: Secondary | ICD-10-CM | POA: Diagnosis not present

## 2023-02-01 DIAGNOSIS — U071 COVID-19: Secondary | ICD-10-CM | POA: Diagnosis not present

## 2023-02-05 DIAGNOSIS — I2510 Atherosclerotic heart disease of native coronary artery without angina pectoris: Secondary | ICD-10-CM | POA: Diagnosis not present

## 2023-02-05 DIAGNOSIS — I959 Hypotension, unspecified: Secondary | ICD-10-CM | POA: Diagnosis not present

## 2023-02-05 DIAGNOSIS — N1832 Chronic kidney disease, stage 3b: Secondary | ICD-10-CM | POA: Diagnosis not present

## 2023-02-05 DIAGNOSIS — I5031 Acute diastolic (congestive) heart failure: Secondary | ICD-10-CM | POA: Diagnosis not present

## 2023-02-05 DIAGNOSIS — N179 Acute kidney failure, unspecified: Secondary | ICD-10-CM | POA: Diagnosis not present

## 2023-02-05 DIAGNOSIS — A0472 Enterocolitis due to Clostridium difficile, not specified as recurrent: Secondary | ICD-10-CM | POA: Diagnosis not present

## 2023-02-05 DIAGNOSIS — D631 Anemia in chronic kidney disease: Secondary | ICD-10-CM | POA: Diagnosis not present

## 2023-02-05 DIAGNOSIS — U071 COVID-19: Secondary | ICD-10-CM | POA: Diagnosis not present

## 2023-02-05 DIAGNOSIS — N132 Hydronephrosis with renal and ureteral calculous obstruction: Secondary | ICD-10-CM | POA: Diagnosis not present

## 2023-02-05 DIAGNOSIS — I130 Hypertensive heart and chronic kidney disease with heart failure and stage 1 through stage 4 chronic kidney disease, or unspecified chronic kidney disease: Secondary | ICD-10-CM | POA: Diagnosis not present

## 2023-02-07 DIAGNOSIS — R197 Diarrhea, unspecified: Secondary | ICD-10-CM | POA: Diagnosis not present

## 2023-02-10 ENCOUNTER — Emergency Department: Payer: Medicare HMO

## 2023-02-10 ENCOUNTER — Other Ambulatory Visit: Payer: Self-pay

## 2023-02-10 ENCOUNTER — Inpatient Hospital Stay
Admission: EM | Admit: 2023-02-10 | Discharge: 2023-02-17 | DRG: 872 | Disposition: A | Discharge status: Home Health | Payer: Medicare HMO | Attending: Internal Medicine | Admitting: Internal Medicine

## 2023-02-10 DIAGNOSIS — R8271 Bacteriuria: Secondary | ICD-10-CM | POA: Diagnosis present

## 2023-02-10 DIAGNOSIS — I129 Hypertensive chronic kidney disease with stage 1 through stage 4 chronic kidney disease, or unspecified chronic kidney disease: Secondary | ICD-10-CM | POA: Diagnosis present

## 2023-02-10 DIAGNOSIS — E876 Hypokalemia: Secondary | ICD-10-CM | POA: Diagnosis not present

## 2023-02-10 DIAGNOSIS — G629 Polyneuropathy, unspecified: Secondary | ICD-10-CM | POA: Diagnosis present

## 2023-02-10 DIAGNOSIS — Z87440 Personal history of urinary (tract) infections: Secondary | ICD-10-CM

## 2023-02-10 DIAGNOSIS — Z8619 Personal history of other infectious and parasitic diseases: Secondary | ICD-10-CM | POA: Diagnosis present

## 2023-02-10 DIAGNOSIS — Z885 Allergy status to narcotic agent status: Secondary | ICD-10-CM

## 2023-02-10 DIAGNOSIS — M0800 Unspecified juvenile rheumatoid arthritis of unspecified site: Secondary | ICD-10-CM | POA: Diagnosis present

## 2023-02-10 DIAGNOSIS — M0579 Rheumatoid arthritis with rheumatoid factor of multiple sites without organ or systems involvement: Secondary | ICD-10-CM

## 2023-02-10 DIAGNOSIS — R54 Age-related physical debility: Secondary | ICD-10-CM | POA: Diagnosis present

## 2023-02-10 DIAGNOSIS — Z87442 Personal history of urinary calculi: Secondary | ICD-10-CM

## 2023-02-10 DIAGNOSIS — Z96651 Presence of right artificial knee joint: Secondary | ICD-10-CM | POA: Diagnosis present

## 2023-02-10 DIAGNOSIS — Z7401 Bed confinement status: Secondary | ICD-10-CM

## 2023-02-10 DIAGNOSIS — Z8249 Family history of ischemic heart disease and other diseases of the circulatory system: Secondary | ICD-10-CM

## 2023-02-10 DIAGNOSIS — I959 Hypotension, unspecified: Secondary | ICD-10-CM | POA: Diagnosis present

## 2023-02-10 DIAGNOSIS — Z96643 Presence of artificial hip joint, bilateral: Secondary | ICD-10-CM | POA: Diagnosis present

## 2023-02-10 DIAGNOSIS — A4159 Other Gram-negative sepsis: Principal | ICD-10-CM | POA: Diagnosis present

## 2023-02-10 DIAGNOSIS — Z79899 Other long term (current) drug therapy: Secondary | ICD-10-CM

## 2023-02-10 DIAGNOSIS — R748 Abnormal levels of other serum enzymes: Secondary | ICD-10-CM | POA: Diagnosis present

## 2023-02-10 DIAGNOSIS — Z8541 Personal history of malignant neoplasm of cervix uteri: Secondary | ICD-10-CM

## 2023-02-10 DIAGNOSIS — R6510 Systemic inflammatory response syndrome (SIRS) of non-infectious origin without acute organ dysfunction: Secondary | ICD-10-CM | POA: Diagnosis not present

## 2023-02-10 DIAGNOSIS — Z8616 Personal history of COVID-19: Secondary | ICD-10-CM

## 2023-02-10 DIAGNOSIS — Z803 Family history of malignant neoplasm of breast: Secondary | ICD-10-CM

## 2023-02-10 DIAGNOSIS — M069 Rheumatoid arthritis, unspecified: Secondary | ICD-10-CM | POA: Diagnosis present

## 2023-02-10 DIAGNOSIS — N134 Hydroureter: Secondary | ICD-10-CM | POA: Diagnosis not present

## 2023-02-10 DIAGNOSIS — A045 Campylobacter enteritis: Secondary | ICD-10-CM | POA: Diagnosis present

## 2023-02-10 DIAGNOSIS — R262 Difficulty in walking, not elsewhere classified: Secondary | ICD-10-CM | POA: Diagnosis present

## 2023-02-10 DIAGNOSIS — Z743 Need for continuous supervision: Secondary | ICD-10-CM | POA: Diagnosis not present

## 2023-02-10 DIAGNOSIS — N1832 Chronic kidney disease, stage 3b: Secondary | ICD-10-CM | POA: Diagnosis present

## 2023-02-10 DIAGNOSIS — I9589 Other hypotension: Secondary | ICD-10-CM | POA: Diagnosis present

## 2023-02-10 DIAGNOSIS — R197 Diarrhea, unspecified: Secondary | ICD-10-CM

## 2023-02-10 DIAGNOSIS — Z87891 Personal history of nicotine dependence: Secondary | ICD-10-CM

## 2023-02-10 DIAGNOSIS — R1110 Vomiting, unspecified: Secondary | ICD-10-CM | POA: Diagnosis not present

## 2023-02-10 DIAGNOSIS — R112 Nausea with vomiting, unspecified: Secondary | ICD-10-CM | POA: Diagnosis present

## 2023-02-10 DIAGNOSIS — B961 Klebsiella pneumoniae [K. pneumoniae] as the cause of diseases classified elsewhere: Secondary | ICD-10-CM | POA: Diagnosis present

## 2023-02-10 DIAGNOSIS — Z880 Allergy status to penicillin: Secondary | ICD-10-CM

## 2023-02-10 HISTORY — DX: Bed confinement status: Z74.01

## 2023-02-10 LAB — LACTIC ACID, PLASMA
Lactic Acid, Venous: 1.5 mmol/L (ref 0.5–1.9)
Lactic Acid, Venous: 1.7 mmol/L (ref 0.5–1.9)

## 2023-02-10 LAB — GASTROINTESTINAL PANEL BY PCR, STOOL (REPLACES STOOL CULTURE)

## 2023-02-10 LAB — COMPREHENSIVE METABOLIC PANEL
ALT: 21 U/L (ref 0–44)
AST: 23 U/L (ref 15–41)
Albumin: 3 g/dL — ABNORMAL LOW (ref 3.5–5.0)
Alkaline Phosphatase: 170 U/L — ABNORMAL HIGH (ref 38–126)
Anion gap: 10 (ref 5–15)
BUN: 26 mg/dL — ABNORMAL HIGH (ref 8–23)
CO2: 21 mmol/L — ABNORMAL LOW (ref 22–32)
Calcium: 8.4 mg/dL — ABNORMAL LOW (ref 8.9–10.3)
Chloride: 110 mmol/L (ref 98–111)
Creatinine, Ser: 1.22 mg/dL — ABNORMAL HIGH (ref 0.44–1.00)
GFR, Estimated: 48 mL/min — ABNORMAL LOW (ref >60)
Glucose, Bld: 121 mg/dL — ABNORMAL HIGH (ref 70–99)
Potassium: 4.2 mmol/L (ref 3.5–5.1)
Sodium: 141 mmol/L (ref 135–145)
Total Bilirubin: 0.7 mg/dL (ref 0.3–1.2)
Total Protein: 6.8 g/dL (ref 6.5–8.1)

## 2023-02-10 LAB — URINALYSIS, ROUTINE W REFLEX MICROSCOPIC
Bacteria, UA: NONE SEEN
Bilirubin Urine: NEGATIVE
Glucose, UA: NEGATIVE mg/dL
Ketones, ur: NEGATIVE mg/dL
Nitrite: POSITIVE — AB
Protein, ur: 30 mg/dL — AB
RBC / HPF: >50 RBC/hpf (ref 0–5)
Specific Gravity, Urine: 1.032 — ABNORMAL HIGH (ref 1.005–1.030)
Squamous Epithelial / HPF: NONE SEEN /HPF (ref 0–5)
WBC, UA: >50 WBC/hpf (ref 0–5)
pH: 6 (ref 5.0–8.0)

## 2023-02-10 LAB — CBC
HCT: 39.5 % (ref 36.0–46.0)
Hemoglobin: 11.9 g/dL — ABNORMAL LOW (ref 12.0–15.0)
MCH: 27.2 pg (ref 26.0–34.0)
MCHC: 30.1 g/dL (ref 30.0–36.0)
MCV: 90.4 fL (ref 80.0–100.0)
Platelets: 384 10*3/uL (ref 150–400)
RBC: 4.37 MIL/uL (ref 3.87–5.11)
RDW: 16 % — ABNORMAL HIGH (ref 11.5–15.5)
WBC: 18 10*3/uL — ABNORMAL HIGH (ref 4.0–10.5)
nRBC: 0 % (ref 0.0–0.2)

## 2023-02-10 LAB — PROTIME-INR
INR: 1.2 (ref 0.8–1.2)
Prothrombin Time: 15.6 seconds — ABNORMAL HIGH (ref 11.4–15.2)

## 2023-02-10 LAB — LIPASE, BLOOD: Lipase: 27 U/L (ref 11–51)

## 2023-02-10 LAB — C DIFFICILE QUICK SCREEN W PCR REFLEX
C Diff antigen: NEGATIVE
C Diff interpretation: NOT DETECTED
C Diff toxin: NEGATIVE

## 2023-02-10 MED ORDER — AZITHROMYCIN 250 MG PO TABS
500.0000 mg | ORAL_TABLET | Freq: Every day | ORAL | Status: AC
  Administered 2023-02-10 – 2023-02-12 (×3): 500 mg via ORAL
  Filled 2023-02-10: qty 1
  Filled 2023-02-10: qty 2
  Filled 2023-02-10: qty 1

## 2023-02-10 MED ORDER — ZINC SULFATE 220 (50 ZN) MG PO CAPS
220.0000 mg | ORAL_CAPSULE | Freq: Every day | ORAL | Status: DC
  Administered 2023-02-11 – 2023-02-17 (×6): 220 mg via ORAL
  Filled 2023-02-10 (×7): qty 1

## 2023-02-10 MED ORDER — IOHEXOL 300 MG/ML  SOLN
80.0000 mL | Freq: Once | INTRAMUSCULAR | Status: AC | PRN
  Administered 2023-02-10: 80 mL via INTRAVENOUS

## 2023-02-10 MED ORDER — ONDANSETRON HCL 4 MG/2ML IJ SOLN
4.0000 mg | INTRAMUSCULAR | Status: AC
  Administered 2023-02-10: 4 mg via INTRAVENOUS
  Filled 2023-02-10: qty 2

## 2023-02-10 MED ORDER — VANCOMYCIN HCL 125 MG PO CAPS
125.0000 mg | ORAL_CAPSULE | ORAL | Status: AC
  Administered 2023-02-12 – 2023-02-15 (×2): 125 mg via ORAL
  Filled 2023-02-10 (×2): qty 1

## 2023-02-10 MED ORDER — ENOXAPARIN SODIUM 40 MG/0.4ML IJ SOSY
40.0000 mg | PREFILLED_SYRINGE | INTRAMUSCULAR | Status: DC
  Administered 2023-02-11 – 2023-02-16 (×6): 40 mg via SUBCUTANEOUS
  Filled 2023-02-10 (×6): qty 0.4

## 2023-02-10 MED ORDER — VITAMIN B-12 1000 MCG PO TABS
1000.0000 ug | ORAL_TABLET | Freq: Every day | ORAL | Status: DC
  Administered 2023-02-11 – 2023-02-17 (×6): 1000 ug via ORAL
  Filled 2023-02-10: qty 1
  Filled 2023-02-10: qty 2
  Filled 2023-02-10 (×4): qty 1
  Filled 2023-02-10: qty 2

## 2023-02-10 MED ORDER — OXYCODONE HCL 5 MG PO TABS
5.0000 mg | ORAL_TABLET | Freq: Four times a day (QID) | ORAL | Status: DC | PRN
  Administered 2023-02-10 – 2023-02-17 (×5): 5 mg via ORAL
  Filled 2023-02-10 (×7): qty 1

## 2023-02-10 MED ORDER — ACETAMINOPHEN 650 MG RE SUPP: 650.0000 mg | Freq: Four times a day (QID) | RECTAL | Status: DC | PRN

## 2023-02-10 MED ORDER — ADULT MULTIVITAMIN W/MINERALS CH
1.0000 | ORAL_TABLET | Freq: Every day | ORAL | Status: DC
  Administered 2023-02-11 – 2023-02-17 (×6): 1 via ORAL
  Filled 2023-02-10 (×7): qty 1

## 2023-02-10 MED ORDER — ONDANSETRON HCL 4 MG PO TABS
4.0000 mg | ORAL_TABLET | Freq: Four times a day (QID) | ORAL | Status: DC | PRN
  Administered 2023-02-11: 4 mg via ORAL
  Filled 2023-02-10: qty 1

## 2023-02-10 MED ORDER — MIDODRINE HCL 5 MG PO TABS: 5.0000 mg | ORAL_TABLET | Freq: Three times a day (TID) | ORAL | Status: DC | PRN

## 2023-02-10 MED ORDER — ONDANSETRON HCL 4 MG/2ML IJ SOLN
4.0000 mg | Freq: Four times a day (QID) | INTRAMUSCULAR | Status: DC | PRN
  Administered 2023-02-11 – 2023-02-16 (×5): 4 mg via INTRAVENOUS
  Filled 2023-02-10 (×6): qty 2

## 2023-02-10 MED ORDER — GABAPENTIN 100 MG PO CAPS
100.0000 mg | ORAL_CAPSULE | Freq: Every day | ORAL | Status: DC
  Administered 2023-02-10 – 2023-02-16 (×7): 100 mg via ORAL
  Filled 2023-02-10 (×7): qty 1

## 2023-02-10 MED ORDER — SACCHAROMYCES BOULARDII 250 MG PO CAPS
250.0000 mg | ORAL_CAPSULE | Freq: Two times a day (BID) | ORAL | Status: AC
  Administered 2023-02-11 – 2023-02-16 (×11): 250 mg via ORAL
  Filled 2023-02-10 (×13): qty 1

## 2023-02-10 MED ORDER — ACETAMINOPHEN 325 MG PO TABS: 650.0000 mg | ORAL_TABLET | Freq: Four times a day (QID) | ORAL | Status: DC | PRN

## 2023-02-10 MED ORDER — MIDODRINE HCL 5 MG PO TABS
5.0000 mg | ORAL_TABLET | ORAL | Status: AC
  Administered 2023-02-10: 5 mg via ORAL
  Filled 2023-02-10: qty 1

## 2023-02-10 MED ORDER — SODIUM CHLORIDE 0.9 % IV BOLUS
500.0000 mL | Freq: Once | INTRAVENOUS | Status: AC
  Administered 2023-02-10: 500 mL via INTRAVENOUS

## 2023-02-10 NOTE — H&P (Signed)
History and Physical    Patient: Jeanne [name] ZOX:[id number] DOB: 28-Nov-1950 DOA: 02/10/2023 DOS: the patient was seen and examined on 02/10/2023 PCP: Jerl Mina, MD  Patient coming from: Home  Chief Complaint:  Chief Complaint  Patient presents with   Emesis   Diarrhea   HPI: Jeanne [name] is a 72 y.o. female with medical history significant of juvenile rheumatoid arthritis, hypertension, CKD, cervical cancer, C. Difficile who presented for nausea, vomiting, diarrhea.  [place] called EMS due  generalized abdominal pain, nausea, vomiting and nonbloody watery diarrhea for the past few days. Reports diarrheal episodes twice an hour.  She was recently hospitalized for C. Diff with pancolitis and COVID.  Endorses fatigue and decreased appetite. Has had RLE pain and swelling since her hip replacement.  Denies fever, dyspnea, chest pain. She has been continuing her Vancomycin taper. EMS gave her 4 mg of Zofran and 210 cc of normal saline.    Initial ED vitals revealed, afebrile but is tachycardic and satting 95% on room air.  Blood pressures were soft at 98/64.  She was given 5 mg of midodrine, 4 mg of Zofran, 500 cc bolus NS and 5 mg of oxycodone.  Initial CBG 121.  She has leukocytosis (WBC 18) and blood cultures were obtained.  C. difficile quick screen was unremarkable.  GI pathogen panel collected.  Lipase and lactic acid were normal.  Urinalysis has not yet resulted.  CMP revealing bicarb 21, glucose 121, BUN 26, creati reports being bedbound since October right hip surgery and has chronic nine 1.22, calcium 8.4, albumin 3.9, ALP 170, remaining results are normal. EDP placed a rectal tube at pt's request. CT abdomen pelvis in the ED showed colitis.  ED provider consulted hospitalist team for evaluation for admission.    Review of Systems: As mentioned in the history of present illness. All other systems reviewed and are negative. Past Medical History:  Diagnosis Date   Bedbound     since R hip surgery, cannot bear weight on R leg. since 07/02/22. Has not walked since then   Cervical cancer (HCC)    CKD stage 3b, GFR 30-44 ml/min (HCC)    Complication of anesthesia    History of kidney stones    Hypertension    PONV (postoperative nausea and vomiting)    rheumatoidArthritis    RA   Past Surgical History:  Procedure Laterality Date   ABDOMINAL HYSTERECTOMY  09/10/2003   CYSTOGRAM N/A 08/04/2018   Procedure: CYSTOGRAM WITH URETHAL DILATION;  Surgeon: Vanna Scotland, MD;  Location: [facility] ORS;  Service: Urology;  Laterality: N/A;   CYSTOSCOPY W/ RETROGRADES Bilateral 08/04/2018   Procedure: CYSTOSCOPY WITH RETROGRADE PYELOGRAM;  Surgeon: Vanna Scotland, MD;  Location: [facility] ORS;  Service: Urology;  Laterality: Bilateral;   CYSTOSCOPY/URETEROSCOPY/HOLMIUM LASER/STENT PLACEMENT Left 08/04/2018   Procedure: CYSTOSCOPY/URETEROSCOPY/HOLMIUM LASER/STENT PLACEMENT;  Surgeon: Vanna Scotland, MD;  Location: [facility] ORS;  Service: Urology;  Laterality: Left;   JOINT REPLACEMENT     TOTAL HIP ARTHROPLASTY Right 1972   TOTAL HIP ARTHROPLASTY Left 1981   TOTAL KNEE ARTHROPLASTY Right 1990   Social History:  reports that she quit smoking about 33 years ago. Her smoking use included cigarettes. She has never used smokeless tobacco. She reports that she does not drink alcohol and does not use drugs.  Allergies  Allergen Reactions   Codeine Nausea Only and Nausea And Vomiting   Augmentin [Amoxicillin-Pot Clavulanate] Nausea Only    Very nauseated/ feels sore on body  Family History  Problem Relation Age of Onset   Breast cancer Mother    Hypertension Father    Cancer Father    Prostate cancer Neg Hx    Kidney cancer Neg Hx    Bladder Cancer Neg Hx     Prior to Admission medications   Medication Sig Start Date End Date Taking? Authorizing Provider  cyanocobalamin 1000 MCG tablet Take 1 tablet (1,000 mcg total) by mouth daily. 01/20/23 04/20/23  Gillis Santa, MD   gabapentin (NEURONTIN) 100 MG capsule Take 100 mg by mouth at bedtime. 10/16/22 10/16/23  [provider]  lidocaine-prilocaine (EMLA) cream APPLY SPARINGLY 2-3 TIMES A DAY TO AREA OF RADIATION THERAPY IRRITATION. 05/11/20   Bacigalupo, Marzella Schlein, MD  midodrine (PROAMATINE) 5 MG tablet Take 1 tablet (5 mg total) by mouth 3 (three) times daily as needed (Systolic BP less than 100 mmHg). 01/19/23 02/18/23  Gillis Santa, MD  Multiple Vitamin (MULTIVITAMIN WITH MINERALS) TABS tablet Take 1 tablet by mouth daily. 01/20/23 04/20/23  Gillis Santa, MD  oxyCODONE (OXY IR/ROXICODONE) 5 MG immediate release tablet Take 1 tablet (5 mg total) by mouth every 6 (six) hours as needed. 01/19/23   Gillis Santa, MD  promethazine (PHENERGAN) 25 MG tablet TAKE 0.5 TABLETS (12.5 MG TOTAL) BY MOUTH EVERY 6 (SIX) HOURS AS NEEDED FOR NAUSEA OR VOMITING. 04/06/21   Chrismon, Jodell Cipro, PA-C  saccharomyces boulardii (FLORASTOR) 250 MG capsule Take 1 capsule (250 mg total) by mouth 2 (two) times daily for 28 days. 01/19/23 02/16/23  Gillis Santa, MD  tretinoin (RETIN-A) 0.025 % cream Apply topically at bedtime.    [provider]  vancomycin (VANCOCIN) 125 MG capsule Take 1 capsule (125 mg total) by mouth every 3 (three) days for 3 doses. 02/06/23 02/13/23  Gillis Santa, MD  Vitamin D, Ergocalciferol, (DRISDOL) 1.25 MG (50000 UNIT) CAPS capsule Take 1 capsule (50,000 Units total) by mouth every 7 (seven) days. 01/21/23 04/21/23  Gillis Santa, MD  zinc sulfate 220 (50 Zn) MG capsule Take 1 capsule (220 mg total) by mouth daily for 28 days. 01/20/23 02/17/23  Gillis Santa, MD    Physical Exam: Vitals:   02/10/23 1600 02/10/23 1900 02/10/23 1930 02/10/23 1940  BP: 113/84 118/69 101/62   Pulse:  92 88   Resp:  18    Temp:    97.7 F (36.5 C)  TempSrc:    Oral  SpO2:  100% 100%   Weight:      Height:       GEN:     alert, non-toxic appearing elderly female and no distress    HENT:  mucus membranes moist, oropharyngeal  without lesions or erythema no nasal discharge  EYES:   EOM intact, no scleral icterus  NECK:  supple, baseline ROM RESP:  clear to auscultation bilaterally, no increased work of breathing  CVS:   regular rate and rhythm, distal pulses intact   ABD:  soft, non-tender in all quadrants; bowel sounds present; no palpable masses, no guarding, no rebound EXT:   normal ROM, feet cool to touch, able to lift LLE > RLE, pitting edema to lower shin of RLE, good pulses  NEURO:  speech normal, alert and oriented, gross sensation intact  Skin:   warm and dry, right posterolateral knee abrasion, surgical scar of right hip Psych: Normal affect, appropriate speech and behavior   Data Reviewed: Relevant notes from primary care and specialist visits, past discharge summaries as available in EHR, including Care Everywhere. Prior  diagnostic testing as pertinent to current admission diagnoses Updated medications and problem lists for reconciliation ED course, including vitals, labs, imaging, treatment and response to treatment Triage notes, nursing and pharmacy notes and ED provider's notes Notable results as noted in HPI   Assessment and Plan: Principal Problem:   Colitis due to Campylobacter species Active Problems:   Arthritis or polyarthritis, rheumatoid (HCC)   Nausea vomiting and diarrhea   Hypotension   CKD stage 3b, GFR 30-44 ml/min (HCC)   Asymptomatic bacteriuria   Difficulty walking  Colitis  CT ABD/Pelvis showed mild wall thickening and submucosal enhancement of distal descending colon. Has rectal tube placed in ED. GI pathogen panel suggests Campylobacter infection. No abdominal tenderness or current pain reported. Given acute ramp up of symptoms with negative C.diff screen will start azithromycin to treat campylobacter infection. - Azithromycin 500 mg for 3 days  - Continue oral Vancomycin  - Continue saccharomyces boulardii - Remove rectal tube when able  - Zofran IV PRN  - Consider  infectious disease consult  History of C. Difficle  - Continue Vancomycin taper   Hypotension  - Continue home Midodrine 5 mg TID PRN   Asymptomatic bacteriuria UA concerning for possible cystitis. Pt with hx of recurrent UTIs but denies symptoms today.  - Defer treatment for now and monitor for development of symptoms   Rheumatoid arthritis  RLE neuropathy  Hold home Diclofenac due to colitis. Continue home Gabapentin and oxycodone prn.   Chronic Kidney Disease  Serum Creatine at her baseline.  - Avoid nephrotoxic agents  - Consider gentle IVF if pt unable to maintain hydration  - Trend serum creatine with BMP   Difficulty walking Pt reports being bedbound since October 2023 right hip surgery.  She is having difficulty bearing weight on her right leg.  Has some right ankle swelling.   - PT / OT evaluate and treat   ALP Elevation Does have biliary sludge versus layering stones on CT ABD - monitor for RUQ pain    Advance Care Planning:   Code Status: Full Code   Consults: PT/OT  Family Communication: husband at bedside   Severity of Illness: The appropriate patient status for this patient is OBSERVATION. Observation status is judged to be reasonable and necessary in order to provide the required intensity of service to ensure the patient's safety. The patient's presenting symptoms, physical exam findings, and initial radiographic and laboratory data in the context of their medical condition is felt to place them at decreased risk for further clinical deterioration. Furthermore, it is anticipated that the patient will be medically stable for discharge from the hospital within 2 midnights of admission.   Author: Katha Cabal, DO 02/10/2023 9:35 PM  For on call review [email or web address].

## 2023-02-10 NOTE — ED Triage Notes (Signed)
First Nurse Note:  Pt via [facility] from home. Pt c/o NVD for the past 2 days. Pt was recently d/c from the hospital for c diff. States she feels the exact same she did when she was admitted. Endorses generalized abd pain. EMS states 1 episode of vomiting. EMS gave 4mg  of Zofran and 210 of NS.  20 G L hand  137/69 BP 108 HR  117 CBG

## 2023-02-10 NOTE — ED Triage Notes (Addendum)
Pt to ED via [facility], with daughter, for NVD since yesterday morning, diarrhea became watery last night around 7pm, states having diarrhea every hour since then. Vomiting twice per hour since yesterday. Pt states just does not feel good. In recliner, is bedbound (states has been bedbound since 10/23 after R hip surgery, states cannot bear weight to R leg and R ankle is always swollen. States has now walked since then).  Had  c diff 3 weeks ago. 20# EMS IV to L hand, 4mg  zofran given by EMS. Nausea continues.

## 2023-02-10 NOTE — ED Provider Notes (Signed)
[facility] Provider Note    Event Date/Time   First MD Initiated Contact with Patient 02/10/23 1410     (approximate)   History   Emesis and Diarrhea   HPI  Jeanne [name] is a 72 y.o. female with a history of cervical cancer hypertension chronic kidney disease who was recently admitted to the hospital and discharged May 11 from with septic shock and C. difficile infection.     Reviewed discharge summary from May 11 patient has a history of recent treatment for pancolitis.  Also C. difficile, COVID-19 AKI.  Patient was treated for C. difficile.  Also the patient was notably sent home on midodrine 5 mg 3 times daily with plan to monitor her blood pressures at home and utilize if systolic blood pressure less than 100   Patient reports that about 4 5 days ago start developing feeling of fatigue and slight nausea.  She went and saw her doctor because she reports she started with the exact same symptoms when she was diagnosed with C. difficile.  They did a test and it was "negative".  Now yesterday evening she started developing large amounts of explosive watery diarrhea.  Not black or bloody.  It "looks like vomit" but it is coming out of her rectum.  She has been incontinent of it due to the volume of it.  She reports a previous need for rectal tube  No chest pain no fever no shortness of breath.  She feels fatigued.  Nausea precluding her from taking her midodrine and home medications this morning  She does report she has been tapering down on vancomycin   Physical Exam   Triage Vital Signs: ED Triage Vitals  Enc Vitals Group     BP 02/10/23 1113 98/64     Pulse Rate 02/10/23 1113 (!) 115     Resp 02/10/23 1113 16     Temp 02/10/23 1113 98.4 F (36.9 C)     Temp Source 02/10/23 1113 Oral     SpO2 02/10/23 1113 95 %     Weight 02/10/23 1115 129 lb (58.5 kg)     Height 02/10/23 1115 5\' 4"  (1.626 m)     Head Circumference --      Peak Flow --       Pain Score 02/10/23 1114 9     Pain Loc --      Pain Edu? --      Excl. in GC? --     Most recent vital signs: Vitals:   02/10/23 1113 02/10/23 1351  BP: 98/64 98/63  Pulse: (!) 115 (!) 118  Resp: 16 20  Temp: 98.4 F (36.9 C) 97.6 F (36.4 C)  SpO2: 95% 98%     General: Awake, no distress.  Appears somewhat pale and chronically ill but is pleasant alert and well-oriented. CV:  Good peripheral perfusion.  Resp:  Normal effort.  Normal work of breathing speaks in full clear sentences Abd:  No distention.  Soft nontender throughout.  Patient reports more of a nauseated feeling but no specific pain in any area.  Reports it is not painful. Other:     ED Results / Procedures / Treatments   Labs (all labs ordered are listed, but only abnormal results are displayed) Labs Reviewed  COMPREHENSIVE METABOLIC PANEL - Abnormal; Notable for the following components:      Result Value   CO2 21 (*)    Glucose, Bld 121 (*)    BUN  26 (*)    Creatinine, Ser 1.22 (*)    Calcium 8.4 (*)    Albumin 3.0 (*)    Alkaline Phosphatase 170 (*)    GFR, Estimated 48 (*)    All other components within normal limits  CBC - Abnormal; Notable for the following components:   WBC 18.0 (*)    Hemoglobin 11.9 (*)    RDW 16.0 (*)    All other components within normal limits  C DIFFICILE QUICK SCREEN W PCR REFLEX    GASTROINTESTINAL PANEL BY PCR, STOOL (REPLACES STOOL CULTURE)  CULTURE, BLOOD (ROUTINE X 2)  CULTURE, BLOOD (ROUTINE X 2)  LIPASE, BLOOD  URINALYSIS, ROUTINE W REFLEX MICROSCOPIC  LACTIC ACID, PLASMA  LACTIC ACID, PLASMA  PROTIME-INR  URINALYSIS, COMPLETE (UACMP) WITH MICROSCOPIC   Labs today remarkable for significant leukocytosis white count 18,000.  Marked increase from previous check 3 weeks ago.  Mild but elevated creatinine, consistent with chronic renal disease.    RADIOLOGY  Imaging abdomen pelvis is pending at time of signout to be followed up by Dr.  Vicente Males   PROCEDURES:  Critical Care performed: No  Procedures   MEDICATIONS ORDERED IN ED: Medications  midodrine (PROAMATINE) tablet 5 mg (0 mg Oral Hold 02/10/23 1436)  sodium chloride 0.9 % bolus 500 mL (500 mLs Intravenous New Bag/Given 02/10/23 1518)  ondansetron (ZOFRAN) injection 4 mg (4 mg Intravenous Given 02/10/23 1519)     IMPRESSION / MDM / ASSESSMENT AND PLAN / ED COURSE  I reviewed the triage vital signs and the nursing notes.                              Differential diagnosis includes, but is not limited to, recurrent C. difficile, infectious diarrhea, other causation such as obstruction inflammation colitis diverticulitis etc. are strongly considered.  No acute cardiopulmonary symptoms.  She is alert and well-oriented.  She is hypotensive but based on hospital notes and her use of midodrine at home and unable to tolerate due to nausea and small amounts of vomiting with severe loose stools for the last day I suspect this is likely more chronic than acute.  Will trial Zofran as well as see if she can tolerate midodrine as she reports she thinks she can keep it down now  Further workup including repeat CT imaging to evaluate for possible obstruction, colitis, diverticulitis, or other acute intra-abdominal cause for severe explosive diarrhea is pending.  Denies any urinary symptoms    Patient's presentation is most consistent with acute complicated illness / injury requiring diagnostic workup.   The patient is on the cardiac monitor to evaluate for evidence of arrhythmia and/or significant heart rate changes.  Reviewed bacteriology report from May 30 which was C. difficile toxin negative via her primary care   ----------------------------------------- 3:18 PM on 02/10/2023 ----------------------------------------- Ongoing care assigned to Dr. Vicente Males.  Anticipate need for admission but pending studies include lactic acid, stool studies, CT abdomen pelvis.  FINAL  CLINICAL IMPRESSION(S) / ED DIAGNOSES   Final diagnoses:  SIRS (systemic inflammatory response syndrome) (HCC)  Diarrhea of presumed infectious origin   Please note my diagnosis of systemic inflammatory response syndrome is quite provisional.  Further workup is pending including infectious workup and CT abdomen pelvis.  There does seem to be a high likelihood of potential sepsis but at this time source has not yet been identified with certainty  Rx / DC Orders   ED Discharge  Orders     None        Note:  This document was prepared using Dragon voice recognition software and may include unintentional dictation errors.   Sharyn Creamer, MD 02/10/23 [phone]

## 2023-02-10 NOTE — ED Notes (Signed)
Pt given lunchbox and cranberry juice.

## 2023-02-11 DIAGNOSIS — R262 Difficulty in walking, not elsewhere classified: Secondary | ICD-10-CM

## 2023-02-11 DIAGNOSIS — I129 Hypertensive chronic kidney disease with stage 1 through stage 4 chronic kidney disease, or unspecified chronic kidney disease: Secondary | ICD-10-CM | POA: Diagnosis not present

## 2023-02-11 DIAGNOSIS — A4159 Other Gram-negative sepsis: Secondary | ICD-10-CM | POA: Diagnosis not present

## 2023-02-11 DIAGNOSIS — Z8619 Personal history of other infectious and parasitic diseases: Secondary | ICD-10-CM

## 2023-02-11 DIAGNOSIS — Z8616 Personal history of COVID-19: Secondary | ICD-10-CM | POA: Diagnosis not present

## 2023-02-11 DIAGNOSIS — Z8541 Personal history of malignant neoplasm of cervix uteri: Secondary | ICD-10-CM | POA: Diagnosis not present

## 2023-02-11 DIAGNOSIS — Z7401 Bed confinement status: Secondary | ICD-10-CM | POA: Diagnosis not present

## 2023-02-11 DIAGNOSIS — Z96649 Presence of unspecified artificial hip joint: Secondary | ICD-10-CM | POA: Diagnosis not present

## 2023-02-11 DIAGNOSIS — I959 Hypotension, unspecified: Secondary | ICD-10-CM

## 2023-02-11 DIAGNOSIS — R6510 Systemic inflammatory response syndrome (SIRS) of non-infectious origin without acute organ dysfunction: Principal | ICD-10-CM

## 2023-02-11 DIAGNOSIS — Z87891 Personal history of nicotine dependence: Secondary | ICD-10-CM | POA: Diagnosis not present

## 2023-02-11 DIAGNOSIS — R748 Abnormal levels of other serum enzymes: Secondary | ICD-10-CM | POA: Diagnosis not present

## 2023-02-11 DIAGNOSIS — R197 Diarrhea, unspecified: Secondary | ICD-10-CM | POA: Diagnosis not present

## 2023-02-11 DIAGNOSIS — Z87442 Personal history of urinary calculi: Secondary | ICD-10-CM | POA: Diagnosis not present

## 2023-02-11 DIAGNOSIS — M0800 Unspecified juvenile rheumatoid arthritis of unspecified site: Secondary | ICD-10-CM | POA: Diagnosis not present

## 2023-02-11 DIAGNOSIS — T84038A Mechanical loosening of other internal prosthetic joint, initial encounter: Secondary | ICD-10-CM | POA: Diagnosis not present

## 2023-02-11 DIAGNOSIS — Z96651 Presence of right artificial knee joint: Secondary | ICD-10-CM | POA: Diagnosis not present

## 2023-02-11 DIAGNOSIS — Z8249 Family history of ischemic heart disease and other diseases of the circulatory system: Secondary | ICD-10-CM | POA: Diagnosis not present

## 2023-02-11 DIAGNOSIS — N1832 Chronic kidney disease, stage 3b: Secondary | ICD-10-CM | POA: Diagnosis not present

## 2023-02-11 DIAGNOSIS — R8271 Bacteriuria: Secondary | ICD-10-CM

## 2023-02-11 DIAGNOSIS — E876 Hypokalemia: Secondary | ICD-10-CM | POA: Diagnosis not present

## 2023-02-11 DIAGNOSIS — I9589 Other hypotension: Secondary | ICD-10-CM | POA: Diagnosis not present

## 2023-02-11 DIAGNOSIS — A045 Campylobacter enteritis: Secondary | ICD-10-CM | POA: Diagnosis not present

## 2023-02-11 DIAGNOSIS — R54 Age-related physical debility: Secondary | ICD-10-CM | POA: Diagnosis not present

## 2023-02-11 DIAGNOSIS — Z79899 Other long term (current) drug therapy: Secondary | ICD-10-CM | POA: Diagnosis not present

## 2023-02-11 DIAGNOSIS — B961 Klebsiella pneumoniae [K. pneumoniae] as the cause of diseases classified elsewhere: Secondary | ICD-10-CM | POA: Diagnosis not present

## 2023-02-11 DIAGNOSIS — R110 Nausea: Secondary | ICD-10-CM | POA: Diagnosis not present

## 2023-02-11 DIAGNOSIS — Z885 Allergy status to narcotic agent status: Secondary | ICD-10-CM | POA: Diagnosis not present

## 2023-02-11 DIAGNOSIS — Z96643 Presence of artificial hip joint, bilateral: Secondary | ICD-10-CM | POA: Diagnosis not present

## 2023-02-11 DIAGNOSIS — G629 Polyneuropathy, unspecified: Secondary | ICD-10-CM | POA: Diagnosis not present

## 2023-02-11 LAB — CBC
HCT: 31.6 % — ABNORMAL LOW (ref 36.0–46.0)
Hemoglobin: 9.8 g/dL — ABNORMAL LOW (ref 12.0–15.0)
MCH: 27.6 pg (ref 26.0–34.0)
MCHC: 31 g/dL (ref 30.0–36.0)
MCV: 89 fL (ref 80.0–100.0)
Platelets: 299 10*3/uL (ref 150–400)
RBC: 3.55 MIL/uL — ABNORMAL LOW (ref 3.87–5.11)
RDW: 16.2 % — ABNORMAL HIGH (ref 11.5–15.5)
WBC: 7.7 10*3/uL (ref 4.0–10.5)
nRBC: 0 % (ref 0.0–0.2)

## 2023-02-11 LAB — COMPREHENSIVE METABOLIC PANEL
ALT: 17 U/L (ref 0–44)
AST: 18 U/L (ref 15–41)
Albumin: 2.5 g/dL — ABNORMAL LOW (ref 3.5–5.0)
Alkaline Phosphatase: 139 U/L — ABNORMAL HIGH (ref 38–126)
Anion gap: 7 (ref 5–15)
BUN: 20 mg/dL (ref 8–23)
CO2: 20 mmol/L — ABNORMAL LOW (ref 22–32)
Calcium: 8.3 mg/dL — ABNORMAL LOW (ref 8.9–10.3)
Chloride: 110 mmol/L (ref 98–111)
Creatinine, Ser: 1.24 mg/dL — ABNORMAL HIGH (ref 0.44–1.00)
GFR, Estimated: 47 mL/min — ABNORMAL LOW (ref >60)
Glucose, Bld: 94 mg/dL (ref 70–99)
Potassium: 3.9 mmol/L (ref 3.5–5.1)
Sodium: 137 mmol/L (ref 135–145)
Total Bilirubin: 0.5 mg/dL (ref 0.3–1.2)
Total Protein: 6 g/dL — ABNORMAL LOW (ref 6.5–8.1)

## 2023-02-11 LAB — CULTURE, BLOOD (ROUTINE X 2)

## 2023-02-11 MED ORDER — SODIUM CHLORIDE 0.9 % IV SOLN
1.0000 g | Freq: Two times a day (BID) | INTRAVENOUS | Status: DC
  Filled 2023-02-11: qty 20

## 2023-02-11 NOTE — Assessment & Plan Note (Signed)
Continue home gabapentin and oxycodone

## 2023-02-11 NOTE — ED Notes (Signed)
Pt cleaned up, new brief and purwick placed. Pt provided comfort at this time.

## 2023-02-11 NOTE — Care Management Obs Status (Signed)
MEDICARE OBSERVATION STATUS NOTIFICATION   Patient Details  Name: Jeanne [name] MRN: [id number] Date of Birth: [birth year]   Medicare Observation Status Notification Given:  Yes    Chapman Fitch, RN 02/11/2023, 3:37 PM

## 2023-02-11 NOTE — Assessment & Plan Note (Signed)
Continue home midodrine. 

## 2023-02-11 NOTE — Assessment & Plan Note (Signed)
Does have biliary sludge versus layering stones on CT abdomen and pelvis.  Started improving. - monitor for RUQ pain

## 2023-02-11 NOTE — Assessment & Plan Note (Signed)
Patient presented with nausea, vomiting and watery diarrhea with history of recent C. difficile infection.  Repeat C. difficile PCR negative but GI pathogen panel positive for Campylobacter. Rectal tube was placed at patient request. -Continue with Zithromax -Continue with supportive care

## 2023-02-11 NOTE — Assessment & Plan Note (Signed)
Repeat C. difficile PCR negative. -Continue with vancomycin taper

## 2023-02-11 NOTE — Evaluation (Addendum)
Physical Therapy Evaluation Patient Details Name: CEDELLA PASCARELLA MRN: [id number] DOB: 1950/11/01 Today's Date: 02/11/2023  History of Present Illness  72 y.o. female with medical history significant of juvenile rheumatoid arthritis, hypertension, CKD, cervical cancer, C. Difficile who presented for nausea, vomiting, diarrhea.  She was recently hospitalized for C. Diff with pancolitis and COVID.  Clinical Impression   Pt in bed eating breakfast. Per pt at baseline she requires assistance for all mobility, able to stand pivot to [facility] intermittently, more difficulty prior to admission. Pt motivated to maximize function. Pt reporting a 9/10 pain at the beginning of the session. Pt was able to complete AAROM hip abd/add for 10 reps, AROM SAQ for 10 reps, and AAROM heel slides on BLE. Pt reported continued pain throughout session. Pt declined mobility due to pain. Pt repositioned in bed with maxA+2. RN notified about pain medication request at end of session. Pt left with table and breakfast, other needs in reach, bed alarm set. Pt would benefit from skilled PT interventions to improve mobility, functional activity tolerance, and pain.      Recommendations for follow up therapy are one component of a multi-disciplinary discharge planning process, led by the attending physician.  Recommendations may be updated based on patient status, additional functional criteria and insurance authorization.  Follow Up Recommendations Can patient physically be transported by private vehicle: No     Assistance Recommended at Discharge Frequent or constant Supervision/Assistance  Patient can return home with the following  Two people to help with walking and/or transfers;Two people to help with bathing/dressing/bathroom;Assistance with cooking/housework;Assist for transportation;Help with stairs or ramp for entrance    Equipment Recommendations Other (comment) (edcuated c/ pt about "stand pivot disc" which may address  her needs for assistance during transfers at home)  Recommendations for Other Services       Functional Status Assessment Patient has had a recent decline in their functional status and demonstrates the ability to make significant improvements in function in a reasonable and predictable amount of time.     Precautions / Restrictions Precautions Precautions: Fall Restrictions Weight Bearing Restrictions: No Other Position/Activity Restrictions: pt notes difficulty with R WBing 2/2 issues with R ankle and R knee      Mobility  Bed Mobility               General bed mobility comments: MAX A +2 for boost up in bed; pt declined all other bed mobility 2/2 pain    Transfers                   General transfer comment: pt declined 2/2 pain    Ambulation/Gait                  Stairs            Wheelchair Mobility    Modified Rankin (Stroke Patients Only)       Balance                                             Pertinent Vitals/Pain Pain Assessment Pain Assessment: 0-10 Pain Score: 9  Pain Location: BLE Pain Descriptors / Indicators: Aching Pain Intervention(s): Limited activity within patient's tolerance, Monitored during session, Repositioned, Patient requesting pain meds-RN notified    Home Living Family/patient expects to be discharged to:: Private residence Living Arrangements: Spouse/significant other;Children Available Help at  Discharge: Family;Available 24 hours/day Type of Home: House Home Access: Ramped entrance       Home Layout: One level Home Equipment: Agricultural consultant (2 wheels);Wheelchair - Dentist;Other (comment);Adaptive equipment Additional Comments: has bilat platforms for RW, has dressing stick    Prior Function Prior Level of Function : Needs assist             Mobility Comments: has been working on trying to walk with HHPT prior to recent admissions,  slide board and daughter or spouse to assist with bed transfers; had a lift but didn't use it; had been pivoting for transfers, last dr appt the fire dept had to help her ADLs Comments: bed bath, uses pure wick, bed bound, bed pan for BMs, family provides all meals, meds, daughter helps with LB dressing/bathing (although she was able to use a dressing stick at one time but now her daughter has assisted with depends and pt typically wears dresses), pt able to complete UB bathing and dressing seated EOB     Hand Dominance   Dominant Hand: Right    Extremity/Trunk Assessment   Upper Extremity Assessment Upper Extremity Assessment: Defer to OT evaluation;Generalized weakness    Lower Extremity Assessment Lower Extremity Assessment: Generalized weakness    Cervical / Trunk Assessment Cervical / Trunk Assessment: Normal  Communication   Communication: No difficulties  Cognition Arousal/Alertness: Awake/alert Behavior During Therapy: WFL for tasks assessed/performed Overall Cognitive Status: Within Functional Limits for tasks assessed                                          General Comments      Exercises Total Joint Exercises Short Arc Quad: AROM, 10 reps, Supine Heel Slides: AAROM, 10 reps, Supine Hip ABduction/ADduction: AAROM, 10 reps, Supine   Assessment/Plan    PT Assessment Patient needs continued PT services  PT Problem List Decreased strength;Decreased range of motion;Decreased activity tolerance;Decreased balance;Decreased mobility;Pain;Decreased coordination       PT Treatment Interventions Gait training;Stair training;Functional mobility training;Therapeutic activities;Therapeutic exercise;Patient/family education;Neuromuscular re-education;DME instruction;Balance training    PT Goals (Current goals can be found in the Care Plan section)  Acute Rehab PT Goals Patient Stated Goal: would like to return to home PT Goal Formulation: With  patient Time For Goal Achievement: 02/25/23 Potential to Achieve Goals: Fair    Frequency Min 2X/week     Co-evaluation PT/OT/SLP Co-Evaluation/Treatment: Yes Reason for Co-Treatment: For patient/therapist safety;To address functional/ADL transfers PT goals addressed during session: Mobility/safety with mobility OT goals addressed during session: ADL's and self-care       AM-PAC PT "6 Clicks" Mobility  Outcome Measure Help needed turning from your back to your side while in a flat bed without using bedrails?: A Lot Help needed moving from lying on your back to sitting on the side of a flat bed without using bedrails?: A Lot Help needed moving to and from a bed to a chair (including a wheelchair)?: A Lot Help needed standing up from a chair using your arms (e.g., wheelchair or bedside chair)?: A Lot Help needed to walk in hospital room?: Total Help needed climbing 3-5 steps with a railing? : Total 6 Click Score: 10    End of Session   Activity Tolerance: Patient limited by pain Patient left: in bed;with call bell/phone within reach;with bed alarm set Nurse Communication: Patient requests pain meds;Mobility status PT Visit  Diagnosis: Muscle weakness (generalized) (M62.81);Difficulty in walking, not elsewhere classified (R26.2)    Time: 1610-9604 PT Time Calculation (min) (ACUTE ONLY): 23 min   Charges:   PT Evaluation $PT Eval Low Complexity: 1 Low PT Treatments $Therapeutic Exercise: 8-22 mins        Lala Lund, PT, SPT  02/11/2023, 12:06 PM

## 2023-02-11 NOTE — Assessment & Plan Note (Signed)
UA positive for nitrites, WBC and RBC.  No urinary symptoms Per patient she gets frequent UTI since the diagnosis of her cervical cancer.  History of ESBL UTI. -Hold off antibiotic at this time -Follow-up urine cultures

## 2023-02-11 NOTE — TOC Initial Note (Signed)
Transition of Care [facility]) - Initial/Assessment Note    Patient Details  Name: Jeanne [name] MRN: [id number] Date of Birth: 07-Mar-1951  Transition of Care [facility]) CM/SW Contact:    Chapman Fitch, RN Phone Number: 02/11/2023, 3:31 PM  Clinical Narrative:                  Admitted for: Colitis Admitted from: home with husband and daughter  PCP: Burnett Sheng Current home health/prior home health/DME:  Agricultural consultant (2 wheels);Wheelchair - Dentist;Other (comment);Adaptive equipment Additional Comments: has bilat platforms for RW, has dressing stick   Per Patient she is active with [facility] home [facility] Patient declines SNF and wishes to return home with home health Message sent to [place] with [facility] to determine what services patient is open with.  Patient is active with PT OT and aide   Patient states that she will need EMS transport home. Confirmed address is correct on facesheet    Expected Discharge Plan: Home w Home Health Services Barriers to Discharge: Continued Medical Work up   Patient Goals and CMS Choice            Expected Discharge Plan and Services                                              Prior Living Arrangements/Services                       Activities of Daily Living      Permission Sought/Granted                  Emotional Assessment              Admission diagnosis:  Diarrhea of presumed infectious origin [R19.7] SIRS (systemic inflammatory response syndrome) (HCC) [R65.10] Colitis due to Campylobacter species [A04.5] Patient Active Problem List   Diagnosis Date Noted   Elevated alkaline phosphatase level 02/11/2023   SIRS (systemic inflammatory response syndrome) (HCC) 02/11/2023   Diarrhea of presumed infectious origin 02/11/2023   Colitis due to Campylobacter species 02/10/2023   Asymptomatic bacteriuria 02/10/2023   Difficulty walking 02/10/2023    Hypomagnesemia 01/08/2023   Hypokalemia 01/08/2023   CKD stage 3b, GFR 30-44 ml/min (HCC) 01/03/2023   Hypotension 01/02/2023   Pancolitis (HCC) 12/29/2022   History of Clostridioides difficile colitis 12/29/2022   Shock circulatory (HCC) 12/28/2022   Sepsis (HCC) 11/10/2022   Nausea vomiting and diarrhea 11/09/2022   Clostridium difficile diarrhea 11/09/2022   COVID-19 virus infection 11/09/2022   AKI (acute kidney injury) (HCC) 11/09/2022   Anemia 11/09/2022   Fungal infection of skin 09/23/2019   UTI (urinary tract infection) 07/15/2017   Dysuria 01/28/2017   Chronic radiation cystitis 02/28/2015   Coitalgia 02/28/2015   Blood pressure elevated 02/28/2015   Calculus of kidney 02/28/2015   Arthritis or polyarthritis, rheumatoid (HCC) 02/28/2015   Basal cell papilloma 02/28/2015   H/O cataract extraction 09/22/2014   Combined form of senile cataract 07/07/2014   NS (nuclear sclerosis) 05/21/2014   History of repair of hip joint 03/16/2013   H/O total knee replacement 03/16/2013   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 [place] [address] Phone: [phone] Fax: [phone]     Social  Determinants of Health (SDOH) Social History: SDOH Screenings   Food Insecurity: No Food Insecurity (12/28/2022)  Housing: Low Risk  (12/28/2022)  Transportation Needs: No Transportation Needs (12/28/2022)  Utilities: Not At Risk (12/28/2022)  Alcohol Screen: Low Risk  (08/10/2020)  Depression (PHQ2-9): Low Risk  (08/10/2020)  Financial Resource Strain: Low Risk  (08/10/2020)  Physical Activity: Inactive (08/10/2020)  Social Connections: Moderately Integrated (08/10/2020)  Stress: No Stress Concern Present (08/10/2020)  Tobacco Use: Medium Risk (02/10/2023)   SDOH Interventions:     Readmission Risk Interventions    01/02/2023   12:24 PM  Readmission Risk  Prevention Plan  Transportation Screening Complete  PCP or Specialist Appt within 3-5 Days Complete  Social Work Consult for Recovery Care Planning/Counseling Complete  Palliative Care Screening Not Applicable  Medication Review Oceanographer) Complete

## 2023-02-11 NOTE — Hospital Course (Addendum)
Taken from H&P.   Jeanne [name] is a 72 y.o. female with medical history significant of juvenile rheumatoid arthritis, hypertension, CKD, cervical cancer, C. Difficile who presented for nausea, vomiting, diarrhea for the past few days.  She was recently hospitalized for C. Diff with pancolitis and COVID.  Endorses fatigue and decreased appetite. Has had RLE pain and swelling since her hip replacement.  She has been continuing her Vancomycin taper.   ED course and data reviewed.  Mildly tachycardic otherwise stable vital.  Later blood pressure was little soft so she was given 5 mg of midodrine and 500 cc NS bolus.  Labs pertinent for leukocytosis at 18, C. difficile coag screen was unremarkable.  GI pathogen panel and blood cultures collected.  Lipase and lactic acid were normal. CT abdomen and pelvis showed colitis.  6/3: Vital stable.  GI pathogen panel positive for Campylobacter species.  She was started on azithromycin.  UA positive for nitrites, RBCs and leukocytes. Urine culture ordered.  Patient with no urinary symptoms, holding antibiotics for now.  History of ESBL infection. PT OT are recommending SNF  6/4: Continue to have watery diarrhea.  She does not want to go to rehab and will be going home with home health once diarrhea improved.  Slight worsening of creatinine to 1.36. Encouraging p.o. hydration.  Urine cultures with Klebsiella oxytoca and Citrobacter-can be colonization as patient does not have urinary symptoms, will avoid necessity antibiotics at this time due to recurrent C. Difficile.  Please discuss with ID before starting antibiotic

## 2023-02-11 NOTE — Assessment & Plan Note (Signed)
Creatinine around baseline. -Monitor renal function -Avoid nephrotoxins

## 2023-02-11 NOTE — Evaluation (Signed)
Occupational Therapy Evaluation Patient Details Name: Jeanne [name] MRN: [id number] DOB: 09-Oct-1950 Today's Date: 02/11/2023   History of Present Illness 72 y.o. female with medical history significant of juvenile rheumatoid arthritis, hypertension, CKD, cervical cancer, C. Difficile who presented for nausea, vomiting, diarrhea.  She was recently hospitalized for C. Diff with pancolitis and COVID.   Clinical Impression   Pt was seen for OT evaluation this date and co-tx with PT to optimize mobility attempts. Prior to this hospital admission, pt was nearly bedbound, requiring assist from family for all transfer attempts, LB ADL, toileting, and all IADL tasks. Pt notes recently requiring assist from the fire department for transportation. Pt lives with her spouse and daughter and has someone to assist 24/7. Pt presents to acute OT demonstrating impaired ADL performance and functional mobility 2/2 significant pain (BLE), decreased strength, activity tolerance, and balance (See OT problem list for additional functional deficits). Pt currently requires MAX A +2 for boosting up in bed. MAX A for bed level LB ADL, and MIN A for UB ADL. Pt able to feed her self and complete bed level grooming tasks without direct assist. Pt would benefit from continued killed OT services to address noted impairments and functional limitations (see below for any additional details) in order to maximize safety and independence while minimizing falls risk and caregiver burden.    Recommendations for follow up therapy are one component of a multi-disciplinary discharge planning process, led by the attending physician.  Recommendations may be updated based on patient status, additional functional criteria and insurance authorization.   Assistance Recommended at Discharge Frequent or constant Supervision/Assistance  Patient can return home with the following Two people to help with walking and/or transfers;A lot of help with  bathing/dressing/bathroom;Assistance with cooking/housework;Assist for transportation;Help with stairs or ramp for entrance    Functional Status Assessment  Patient has had a recent decline in their functional status and demonstrates the ability to make significant improvements in function in a reasonable and predictable amount of time.  Equipment Recommendations  None recommended by OT    Recommendations for Other Services       Precautions / Restrictions Precautions Precautions: Fall Restrictions Weight Bearing Restrictions: No Other Position/Activity Restrictions: pt notes difficulty with R WBing 2/2 issues with R ankle and R knee      Mobility Bed Mobility               General bed mobility comments: MAX A +2 for boost up in bed; pt declined all other bed mobility 2/2 pain    Transfers                   General transfer comment: pt declined 2/2 pain      Balance                                           ADL either performed or assessed with clinical judgement   ADL                                         General ADL Comments: Functional assessment limited 2/2 pain, however, anticipate pt able to complete UB ADL with MIN A, MAX A for LB ADL at bed level. Able to complete self feeding and preparation of meal  tray without assist     Vision         Perception     Praxis      Pertinent Vitals/Pain Pain Assessment Pain Assessment: 0-10 Pain Score: 9  Pain Location: BLE Pain Descriptors / Indicators: Aching Pain Intervention(s): Limited activity within patient's tolerance, Monitored during session, Repositioned, Patient requesting pain meds-RN notified     Hand Dominance     Extremity/Trunk Assessment Upper Extremity Assessment Upper Extremity Assessment: Generalized weakness (hx juvenile arthritis)   Lower Extremity Assessment Lower Extremity Assessment: Defer to PT evaluation;Generalized weakness        Communication     Cognition Arousal/Alertness: Awake/alert Behavior During Therapy: WFL for tasks assessed/performed Overall Cognitive Status: Within Functional Limits for tasks assessed                                       General Comments       Exercises     Shoulder Instructions      Home Living Family/patient expects to be discharged to:: Private residence Living Arrangements: Spouse/significant other;Children (daughter) Available Help at Discharge: Family;Available 24 hours/day Type of Home: House Home Access: Ramped entrance     Home Layout: One level     Bathroom Shower/Tub: Walk-in shower;Tub/shower unit         Home Equipment: Agricultural consultant (2 wheels);Wheelchair - Dentist;Other (comment);Adaptive equipment Adaptive Equipment: Other (Comment) Additional Comments: has bilat platforms for RW, has dressing stick      Prior Functioning/Environment Prior Level of Function : Needs assist             Mobility Comments: has been working on trying to walk with HHPT prior to recent admissions, slide board and daughter or spouse to assist with bed transfers; had a lift but didn't use it; had been pivoting for transfers, last dr appt the fire dept had to help her ADLs Comments: bed bath, uses pure wick, bed bound, bed pan for BMs, family provides all meals, meds, daughter helps with LB dressing/bathing (although she was able to use a dressing stick at one time but now her daughter has assisted with depends and pt typically wears dresses), pt able to complete UB bathing and dressing seated EOB        OT Problem List: Decreased strength;Pain;Decreased range of motion;Decreased activity tolerance;Impaired balance (sitting and/or standing);Decreased knowledge of use of DME or AE      OT Treatment/Interventions: Self-care/ADL training;Therapeutic exercise;Therapeutic activities;DME and/or AE  instruction;Patient/family education;Balance training    OT Goals(Current goals can be found in the care plan section) Acute Rehab OT Goals Patient Stated Goal: get better and be able to walk again OT Goal Formulation: With patient Time For Goal Achievement: 02/25/23 Potential to Achieve Goals: Good ADL Goals Pt Will Perform Upper Body Dressing: sitting;with set-up;with supervision Pt Will Perform Lower Body Dressing: sitting/lateral leans;with mod assist;with adaptive equipment Pt Will Transfer to Toilet: with +2 assist;with max assist;squat pivot transfer;bedside commode  OT Frequency: Min 1X/week    Co-evaluation PT/OT/SLP Co-Evaluation/Treatment: Yes Reason for Co-Treatment: For patient/therapist safety;To address functional/ADL transfers PT goals addressed during session: Mobility/safety with mobility OT goals addressed during session: ADL's and self-care      AM-PAC OT "6 Clicks" Daily Activity     Outcome Measure Help from another person eating meals?: None Help from another person taking care of personal grooming?: A Little Help from another  person toileting, which includes using toliet, bedpan, or urinal?: Total Help from another person bathing (including washing, rinsing, drying)?: A Lot Help from another person to put on and taking off regular upper body clothing?: A Little Help from another person to put on and taking off regular lower body clothing?: A Lot 6 Click Score: 15   End of Session Nurse Communication: Patient requests pain meds  Activity Tolerance: Patient limited by pain Patient left: in bed;with call bell/phone within reach;with bed alarm set  OT Visit Diagnosis: Other abnormalities of gait and mobility (R26.89);Muscle weakness (generalized) (M62.81);Pain Pain - Right/Left: Right (both) Pain - part of body: Hip;Knee;Leg;Ankle and joints of foot                Time: 5409-8119 OT Time Calculation (min): 26 min Charges:  OT General Charges $OT Visit: 1  Visit OT Evaluation $OT Eval Moderate Complexity: 1 Mod  Arman Filter., MPH, MS, OTR/L ascom [phone] 02/11/23, 10:14 AM

## 2023-02-11 NOTE — Progress Notes (Signed)
Progress Note   Patient: Jeanne [name] UJW:[id number] DOB: 1951-05-31 DOA: 02/10/2023     0 DOS: the patient was seen and examined on 02/11/2023   Brief hospital course: Taken from H&P.   [name] is a 72 y.o. female with medical history significant of juvenile rheumatoid arthritis, hypertension, CKD, cervical cancer, C. Difficile who presented for nausea, vomiting, diarrhea for the past few days.  She was recently hospitalized for C. Diff with pancolitis and COVID.  Endorses fatigue and decreased appetite. Has had RLE pain and swelling since her hip replacement.  She has been continuing her Vancomycin taper.   ED course and data reviewed.  Mildly tachycardic otherwise stable vital.  Later blood pressure was little soft so she was given 5 mg of midodrine and 500 cc NS bolus.  Labs pertinent for leukocytosis at 18, C. difficile coag screen was unremarkable.  GI pathogen panel and blood cultures collected.  Lipase and lactic acid were normal. CT abdomen and pelvis showed colitis.  6/3: Vital stable.  GI pathogen panel positive for Campylobacter species.  She was started on azithromycin.  UA positive for nitrites, RBCs and leukocytes. Urine culture ordered.  Patient with no urinary symptoms, holding antibiotics for now.  History of ESBL infection.    Assessment and Plan: * Colitis due to Campylobacter species Patient presented with nausea, vomiting and watery diarrhea with history of recent C. difficile infection.  Repeat C. difficile PCR negative but GI pathogen panel positive for Campylobacter. Rectal tube was placed at patient request. -Continue with Zithromax -Continue with supportive care  History of Clostridioides difficile colitis Repeat C. difficile PCR negative. -Continue with vancomycin taper  Hypotension -Continue home midodrine  Asymptomatic bacteriuria UA positive for nitrites, WBC and RBC.  No urinary symptoms Per patient she gets frequent UTI since the  diagnosis of her cervical cancer.  History of ESBL UTI. -Hold off antibiotic at this time -Follow-up urine cultures   Arthritis or polyarthritis, rheumatoid (HCC) Continue home gabapentin and oxycodone  CKD stage 3b, GFR 30-44 ml/min (HCC) Creatinine around baseline. -Monitor renal function -Avoid nephrotoxins  Difficulty walking Pt reports being bedbound since October 2023 right hip surgery.  She is having difficulty bearing weight on her right leg.  Has some right ankle swelling.   - PT / OT evaluate and treat     Elevated alkaline phosphatase level Does have biliary sludge versus layering stones on CT abdomen and pelvis.  Started improving. - monitor for RUQ pain    Subjective: Patient was seen and examined today.,  Nausea and vomiting improved.  Known abdominal pain.  Continues to have watery diarrhea.  No urinary symptoms.  Physical Exam: Vitals:   02/11/23 0800 02/11/23 0835 02/11/23 1035 02/11/23 1200  BP: (!) 86/56 101/77  108/63  Pulse: 80 96  81  Resp: 16 17  18   Temp:   98 F (36.7 C)   TempSrc:   Oral   SpO2: 98% 96%  98%  Weight:      Height:       General.  Frail elderly lady, in no acute distress. Pulmonary.  Lungs clear bilaterally, normal respiratory effort. CV.  Regular rate and rhythm, no JVD, rub or murmur. Abdomen.  Soft, nontender, nondistended, BS positive. CNS.  Alert and oriented .  No focal neurologic deficit. Extremities.  No edema, no cyanosis, pulses intact and symmetrical. Psychiatry.  Judgment and insight appears normal.   Data Reviewed: Prior data reviewed.  Family Communication: Discussed with  husband on phone.  Disposition: Status is: Observation The patient will require care spanning > 2 midnights and should be moved to inpatient because: Severity of illness  Planned Discharge Destination: Home with Home Health  DVT prophylaxis.  Lovenox Time spent: 50 minutes  This record has been created using Manufacturing engineer. Errors have been sought and corrected,but may not always be located. Such creation errors do not reflect on the standard of care.   Author: Arnetha Courser, MD 02/11/2023 1:57 PM  For on call review [email or web address].

## 2023-02-11 NOTE — Assessment & Plan Note (Signed)
Pt reports being bedbound since October 2023 right hip surgery.  She is having difficulty bearing weight on her right leg.  Has some right ankle swelling.   - PT / OT evaluate and treat

## 2023-02-12 DIAGNOSIS — I959 Hypotension, unspecified: Secondary | ICD-10-CM | POA: Diagnosis not present

## 2023-02-12 DIAGNOSIS — Z8619 Personal history of other infectious and parasitic diseases: Secondary | ICD-10-CM | POA: Diagnosis not present

## 2023-02-12 DIAGNOSIS — R8271 Bacteriuria: Secondary | ICD-10-CM | POA: Diagnosis not present

## 2023-02-12 DIAGNOSIS — A045 Campylobacter enteritis: Secondary | ICD-10-CM | POA: Diagnosis not present

## 2023-02-12 DIAGNOSIS — N1832 Chronic kidney disease, stage 3b: Secondary | ICD-10-CM

## 2023-02-12 LAB — CBC
HCT: 34.9 % — ABNORMAL LOW (ref 36.0–46.0)
Hemoglobin: 10.5 g/dL — ABNORMAL LOW (ref 12.0–15.0)
MCH: 27.7 pg (ref 26.0–34.0)
MCHC: 30.1 g/dL (ref 30.0–36.0)
MCV: 92.1 fL (ref 80.0–100.0)
Platelets: 326 10*3/uL (ref 150–400)
RBC: 3.79 MIL/uL — ABNORMAL LOW (ref 3.87–5.11)
RDW: 15.9 % — ABNORMAL HIGH (ref 11.5–15.5)
WBC: 9.1 10*3/uL (ref 4.0–10.5)
nRBC: 0 % (ref 0.0–0.2)

## 2023-02-12 LAB — CULTURE, BLOOD (ROUTINE X 2): Special Requests: ADEQUATE

## 2023-02-12 LAB — BASIC METABOLIC PANEL
Anion gap: 6 (ref 5–15)
BUN: 15 mg/dL (ref 8–23)
CO2: 24 mmol/L (ref 22–32)
Calcium: 8.5 mg/dL — ABNORMAL LOW (ref 8.9–10.3)
Chloride: 107 mmol/L (ref 98–111)
Creatinine, Ser: 1.36 mg/dL — ABNORMAL HIGH (ref 0.44–1.00)
GFR, Estimated: 42 mL/min — ABNORMAL LOW (ref >60)
Glucose, Bld: 111 mg/dL — ABNORMAL HIGH (ref 70–99)
Potassium: 3.5 mmol/L (ref 3.5–5.1)
Sodium: 137 mmol/L (ref 135–145)

## 2023-02-12 NOTE — Progress Notes (Signed)
Progress Note   Patient: Jeanne [name] YNW:[id number] DOB: March 06, 1951 DOA: 02/10/2023     1 DOS: the patient was seen and examined on 02/12/2023   Brief hospital course: Taken from H&P.   DEYONCE FREDERIKSEN is a 72 y.o. female with medical history significant of juvenile rheumatoid arthritis, hypertension, CKD, cervical cancer, C. Difficile who presented for nausea, vomiting, diarrhea for the past few days.  She was recently hospitalized for C. Diff with pancolitis and COVID.  Endorses fatigue and decreased appetite. Has had RLE pain and swelling since her hip replacement.  She has been continuing her Vancomycin taper.   ED course and data reviewed.  Mildly tachycardic otherwise stable vital.  Later blood pressure was little soft so she was given 5 mg of midodrine and 500 cc NS bolus.  Labs pertinent for leukocytosis at 18, C. difficile coag screen was unremarkable.  GI pathogen panel and blood cultures collected.  Lipase and lactic acid were normal. CT abdomen and pelvis showed colitis.  6/3: Vital stable.  GI pathogen panel positive for Campylobacter species.  She was started on azithromycin.  UA positive for nitrites, RBCs and leukocytes. Urine culture ordered.  Patient with no urinary symptoms, holding antibiotics for now.  History of ESBL infection. PT OT are recommending SNF  6/4: Continue to have watery diarrhea.  She does not want to go to rehab and will be going home with home health once diarrhea improved.  Slight worsening of creatinine to 1.36. Encouraging p.o. hydration.  Urine cultures with Klebsiella oxytoca and Citrobacter-can be colonization as patient does not have urinary symptoms, will avoid necessity antibiotics at this time due to recurrent C. Difficile.  Please discuss with ID before starting antibiotic   Assessment and Plan: * Colitis due to Campylobacter species Patient presented with nausea, vomiting and watery diarrhea with history of recent C. difficile infection.   Repeat C. difficile PCR negative but GI pathogen panel positive for Campylobacter. Rectal tube was placed at patient request. -Continue with Zithromax -Continue with supportive care  History of Clostridioides difficile colitis Repeat C. difficile PCR negative. -Continue with vancomycin taper  Hypotension -Continue home midodrine  Asymptomatic bacteriuria UA positive for nitrites, WBC and RBC.  No urinary symptoms Per patient she gets frequent UTI since the diagnosis of her cervical cancer.  History of ESBL UTI.  Urine cultures with Klebsiella oxytoca and Citrobacter-pending susceptibility, most likely colonization as there is no symptoms -Hold off antibiotic at this time -Need to discuss with ID before starting any other antibiotic  Arthritis or polyarthritis, rheumatoid (HCC) Continue home gabapentin and oxycodone  CKD stage 3b, GFR 30-44 ml/min (HCC) Creatinine around baseline. -Monitor renal function -Avoid nephrotoxins  Difficulty walking Pt reports being bedbound since October 2023 right hip surgery.  She is having difficulty bearing weight on her right leg.  Has some right ankle swelling.   - PT / OT evaluate and treat     Elevated alkaline phosphatase level Does have biliary sludge versus layering stones on CT abdomen and pelvis.  Started improving. - monitor for RUQ pain    Subjective: Patient continued to have some diarrhea but no pain.  No urinary symptoms.  She wants to go home with home health once watery diarrhea improved.  Physical Exam: Vitals:   02/11/23 1523 02/11/23 2112 02/12/23 0455 02/12/23 0852  BP: 119/62 108/60 (!) 94/55 (!) 110/55  Pulse: 85 83 82 78  Resp: 18 20 20 18   Temp: 98 F (36.7 C) 98.4  F (36.9 C) 98.1 F (36.7 C) 98.1 F (36.7 C)  TempSrc: Oral Oral Oral   SpO2: 100% 100% 97% 100%  Weight:      Height:       General.  Frail elderly lady, in no acute distress. Pulmonary.  Lungs clear bilaterally, normal respiratory effort. CV.   Regular rate and rhythm, no JVD, rub or murmur. Abdomen.  Soft, nontender, nondistended, BS positive. CNS.  Alert and oriented .  No focal neurologic deficit. Extremities.  No edema, no cyanosis, pulses intact and symmetrical. Psychiatry.  Judgment and insight appears normal.   Data Reviewed: Prior data reviewed.  Family Communication: Daughter at bedside  Disposition: Status is: Inpatient due to: Severity of illness  Planned Discharge Destination: Home with Home Health  DVT prophylaxis.  Lovenox Time spent: 45 minutes  This record has been created using Conservation officer, historic buildings. Errors have been sought and corrected,but may not always be located. Such creation errors do not reflect on the standard of care.   Author: Arnetha Courser, MD 02/12/2023 2:46 PM  For on call review [email or web address].

## 2023-02-12 NOTE — Plan of Care (Signed)

## 2023-02-12 NOTE — Assessment & Plan Note (Signed)
Patient presented with nausea, vomiting and watery diarrhea with history of recent C. difficile infection.  Repeat C. difficile PCR negative but GI pathogen panel positive for Campylobacter. Rectal tube was placed at patient request. -Continue with Zithromax -Continue with supportive care 

## 2023-02-12 NOTE — Plan of Care (Signed)
  Problem: Pain Managment: Goal: General experience of comfort will improve Outcome: Progressing   Problem: Elimination: Goal: Will not experience complications related to bowel motility Outcome: Progressing   Problem: Safety: Goal: Ability to remain free from injury will improve Outcome: Progressing   Problem: Skin Integrity: Goal: Risk for impaired skin integrity will decrease Outcome: Progressing   

## 2023-02-12 NOTE — Assessment & Plan Note (Signed)
UA positive for nitrites, WBC and RBC.  No urinary symptoms Per patient she gets frequent UTI since the diagnosis of her cervical cancer.  History of ESBL UTI.  Urine cultures with Klebsiella oxytoca and Citrobacter-pending susceptibility, most likely colonization as there is no symptoms -Hold off antibiotic at this time -Need to discuss with ID before starting any other antibiotic

## 2023-02-13 DIAGNOSIS — A045 Campylobacter enteritis: Secondary | ICD-10-CM | POA: Diagnosis not present

## 2023-02-13 DIAGNOSIS — R197 Diarrhea, unspecified: Secondary | ICD-10-CM | POA: Diagnosis not present

## 2023-02-13 LAB — BASIC METABOLIC PANEL
Anion gap: 7 (ref 5–15)
BUN: 13 mg/dL (ref 8–23)
CO2: 21 mmol/L — ABNORMAL LOW (ref 22–32)
Calcium: 8.2 mg/dL — ABNORMAL LOW (ref 8.9–10.3)
Chloride: 108 mmol/L (ref 98–111)
Creatinine, Ser: 1.43 mg/dL — ABNORMAL HIGH (ref 0.44–1.00)
GFR, Estimated: 39 mL/min — ABNORMAL LOW (ref >60)
Glucose, Bld: 112 mg/dL — ABNORMAL HIGH (ref 70–99)
Potassium: 3.1 mmol/L — ABNORMAL LOW (ref 3.5–5.1)
Sodium: 136 mmol/L (ref 135–145)

## 2023-02-13 LAB — URINE CULTURE: Culture: >=100000 — AB

## 2023-02-13 LAB — CULTURE, BLOOD (ROUTINE X 2): Culture: NO GROWTH

## 2023-02-13 MED ORDER — MIDODRINE HCL 5 MG PO TABS
5.0000 mg | ORAL_TABLET | Freq: Three times a day (TID) | ORAL | Status: DC
  Administered 2023-02-13 – 2023-02-17 (×13): 5 mg via ORAL
  Filled 2023-02-13 (×13): qty 1

## 2023-02-13 MED ORDER — POTASSIUM CHLORIDE CRYS ER 20 MEQ PO TBCR
40.0000 meq | EXTENDED_RELEASE_TABLET | Freq: Two times a day (BID) | ORAL | Status: AC
  Administered 2023-02-13 (×2): 40 meq via ORAL
  Filled 2023-02-13 (×2): qty 2

## 2023-02-13 MED ORDER — AZITHROMYCIN 250 MG PO TABS
500.0000 mg | ORAL_TABLET | Freq: Every day | ORAL | Status: AC
  Administered 2023-02-13 – 2023-02-15 (×3): 500 mg via ORAL
  Filled 2023-02-13 (×3): qty 2

## 2023-02-13 MED ORDER — LOPERAMIDE HCL 2 MG PO CAPS
2.0000 mg | ORAL_CAPSULE | ORAL | Status: DC | PRN
  Administered 2023-02-13: 2 mg via ORAL
  Filled 2023-02-13: qty 1

## 2023-02-13 NOTE — Progress Notes (Signed)
PT Cancellation Note  Patient Details Name: Jeanne [name] MRN: [id number] DOB: September 17, 1950   Cancelled Treatment:    Reason Eval/Treat Not Completed: Other (comment).  Chart reviewed and attempted to see pt.  Pt citing significant nausea and requesting to not be seen at this time.  Pt is due for nausea medication in a few hours and will re-attempt then as medically appropriate.  Nolon Bussing, PT, DPT Physical Therapist - [facility]  02/13/23, 2:40 PM

## 2023-02-13 NOTE — Progress Notes (Signed)
PT Cancellation Note  Patient Details Name: Jeanne [name] MRN: [id number] DOB: 02/27/1951   Cancelled Treatment:    Reason Eval/Treat Not Completed: Other (comment).  Chart reviewed and attempted to see pt.  Pt noting slightly improved nausea symptoms but is still refusing therapy due to the nausea she is experiencing.  Pt also noted IV is hurting at this time and nursing was notified.  Will re-attempt at later date and time as medically appropriate.   Nolon Bussing, PT, DPT Physical Therapist - [facility]  02/13/23, 4:15 PM

## 2023-02-13 NOTE — Progress Notes (Addendum)
Progress Note   Patient: Jeanne [name]:[id number] DOB: 1950-11-21 DOA: 02/10/2023     2 DOS: the patient was seen and examined on 02/13/2023   Brief hospital course:  Jeanne [name] is a 72 y.o. female with medical history significant of juvenile rheumatoid arthritis, hypertension, CKD, cervical cancer, C. Difficile- currently on vancomycin taper who presented for nausea, vomiting, diarrhea for the past few days.   She was recently hospitalized for C. Diff with pancolitis and COVID.  Endorses fatigue and decreased appetite. Has had RLE pain and swelling since her hip replacement. Mostly bed-bound. She has been continuing her Vancomycin taper.    ED course and data reviewed.  Mildly tachycardic otherwise stable vital.  Later blood pressure was little soft so she was given 5 mg of midodrine and 500 cc NS bolus.  Labs pertinent for leukocytosis at 18, C. difficile coag screen was unremarkable.  GI pathogen panel and blood cultures collected.  Lipase and lactic acid were normal. CT abdomen and pelvis showed colitis.   6/3: Vital stable.  GI pathogen panel positive for Campylobacter species.  She was started on azithromycin.  UA positive for nitrites, RBCs and leukocytes. Urine culture ordered.  Patient with no urinary symptoms, holding antibiotics for now.  History of ESBL infection. PT OT are recommending SNF   6/4: Continue to have watery diarrhea.  She does not want to go to rehab and will be going home with home health once diarrhea improved.  Slight worsening of creatinine to 1.36. Encouraging p.o. hydration.  Urine cultures with Klebsiella oxytoca and Citrobacter-can be colonization as patient does not have urinary symptoms.  6/5: patient had 2 loose BMs overnight. Denies abdominal pain or bloating. States she is nauseous and unable to tolerate PO this morning.   Assessment and Plan: Colitis due to Campylobacter species Sespsis criteria on admission of tachycardia and hypotension with  GI infection now resolved.  Patient presented with nausea, vomiting and watery diarrhea with history of recent C. difficile infection.  Repeat C. difficile PCR negative but GI pathogen panel positive for Campylobacter. Rectal tube was placed at patient request and has now discontinued.  -Continue with Zithromax -Continue with supportive care - consult ID if fails to improve further  History of Clostridioides difficile colitis Repeat C. difficile PCR negative. -Continue with vancomycin taper  Hypotension -Continue home midodrine  Arthritis or polyarthritis, rheumatoid (HCC)- significant deformity of hands. Does not appear to be taking and DMARDs at home.  Continue home gabapentin and oxycodone  CKD stage 3b, GFR 30-44 ml/min (HCC) Creatinine around baseline. -Monitor renal function -Avoid nephrotoxins  Hypokalemia - monitor and replete PRN  Difficulty walking Pt reports being bedbound since October 2023 right hip surgery.  She is having difficulty bearing weight on her right leg.  Has some right ankle swelling.   - PT / OT evaluate and treat    Subjective: Patient reports "continuous" continuous bowel movements. She has had 2 overnight. She described them as somewhere between watery and soft. Denies abdominal pain currently but has nausea and unable to tolerate breakfast. Denies seeing blood in stool.  Physical Exam: Vitals:   02/12/23 0852 02/12/23 1637 02/12/23 1942 02/13/23 0556  BP: (!) 110/55 115/64 (!) 110/58 (!) 98/53  Pulse: 78 91 93 85  Resp: 18 18 18 18   Temp: 98.1 F (36.7 C) 98.5 F (36.9 C) 98.7 F (37.1 C)   TempSrc:   Oral   SpO2: 100% 100% 100% 100%  Weight:  Height:       General.  Frail elderly lady, in mild distress. Pulmonary.  Lungs clear bilaterally, normal respiratory effort. CV.  Regular rate and rhythm, no JVD, rub or murmur. Abdomen.  Soft, nontender, nondistended, BS positive. CNS.  Alert and oriented .  No focal neurologic  deficit. Extremities.  No edema, no cyanosis, pulses intact and symmetrical. Significant deformity of bilateral hands from arthritis  Psychiatry.  Judgment and insight appears normal.   Data Reviewed: Prior data reviewed.  Family Communication: family member asleep at bedside  Disposition: Status is: Inpatient due to: Severity of illness  Planned Discharge Destination: Home with Home Health  DVT prophylaxis.  Lovenox Time spent: 45 minutes  Author: Leeroy Bock, MD 02/13/2023 7:52 AM  For on call review [email or web address].

## 2023-02-14 LAB — MAGNESIUM: Magnesium: 1.8 mg/dL (ref 1.7–2.4)

## 2023-02-14 LAB — BASIC METABOLIC PANEL
Anion gap: 8 (ref 5–15)
BUN: 11 mg/dL (ref 8–23)
CO2: 20 mmol/L — ABNORMAL LOW (ref 22–32)
Calcium: 8.6 mg/dL — ABNORMAL LOW (ref 8.9–10.3)
Chloride: 111 mmol/L (ref 98–111)
Creatinine, Ser: 1.2 mg/dL — ABNORMAL HIGH (ref 0.44–1.00)
GFR, Estimated: 49 mL/min — ABNORMAL LOW (ref >60)
Glucose, Bld: 129 mg/dL — ABNORMAL HIGH (ref 70–99)
Potassium: 4.1 mmol/L (ref 3.5–5.1)
Sodium: 139 mmol/L (ref 135–145)

## 2023-02-14 LAB — CULTURE, BLOOD (ROUTINE X 2)

## 2023-02-14 NOTE — Progress Notes (Signed)
PT Cancellation Note  Patient Details Name: Jeanne [name] MRN: [id number] DOB: Jan 11, 1951   Cancelled Treatment:    Reason Eval/Treat Not Completed: Other (comment) (Pt again declines PT. She specifically is demotivated by persistent diarrhea and nausea. Pt has not asked for antiemetics today, but says she will if she needs to.) Pt not interested in sitting EOB. Pt says she is agreeable to bed level exercises, but also confirms that she is ModI in her PT HEP from home and has already performed it once today with plans to do it again later on her own. Author explained that this is not a skilled intervention if pt is already independent with these. Author offered to accommodate pt's concerns in a few ways, but pt declines. Will attempt again at later date and time as able.   1:20 PM, 02/14/23 Rosamaria Lints, PT, DPT Physical Therapist - [facility]  [phone] (ASCOM)    Katalena Malveaux C 02/14/2023, 1:16 PM

## 2023-02-14 NOTE — Progress Notes (Addendum)
Mobility Specialist - Progress Note   02/14/23 0948  Mobility  Activity Turned to right side;Turned to left side (bed level exercises)  Level of Assistance Standby assist, set-up cues, supervision of patient - no hands on  Assistive Device None  Range of Motion/Exercises Active;Active Assistive  Activity Response Tolerated well  $Mobility charge 1 Mobility  Mobility Specialist Start Time (ACUTE ONLY) 0920  Mobility Specialist Stop Time (ACUTE ONLY) 0947  Mobility Specialist Time Calculation (min) (ACUTE ONLY) 27 min   Pt supine upon entry, utilizing RA. Pt expresses nausea is better this date. Pt denied transfer to the recliner stating "I'm not suppose to bear weight". MS educated Pt on transfer process, Pt continues to deny stating " I don't want to", "I will work with my therapist once I return home". After more education on mobility, Pt agreeable to bed level exercise. Pt completed 8 reps of each bed level exercise including Bilat ankle pumps,ankle circles and knee slides. Pt required active assistance completing RLE leg raises, and required no assistance with the LLE leg raises. Pt required MaxA +1 for a boost in bed, left supine with needs within reach.   Zetta Bills Mobility Specialist 02/14/23 10:01 AM

## 2023-02-14 NOTE — Progress Notes (Signed)
Progress Note   Patient: Jeanne [name] UEA:[id number] DOB: June 10, 1951 DOA: 02/10/2023     3 DOS: the patient was seen and examined on 02/14/2023   Brief hospital course:  Jeanne [name] is a 72 y.o. female with medical history significant of juvenile rheumatoid arthritis, hypertension, CKD, cervical cancer, C. Difficile- currently on vancomycin taper who presented for nausea, vomiting, diarrhea for the past few days.   She was recently hospitalized for C. Diff with pancolitis and COVID.  Endorses fatigue and decreased appetite. Has had RLE pain and swelling since her hip replacement. Mostly bed-bound. She has been continuing her Vancomycin taper.    ED course and data reviewed.  Mildly tachycardic otherwise stable vital.  Later blood pressure was little soft so she was given 5 mg of midodrine and 500 cc NS bolus.  Labs pertinent for leukocytosis at 18, C. difficile coag screen was unremarkable.  GI pathogen panel and blood cultures collected.  Lipase and lactic acid were normal. CT abdomen and pelvis showed colitis.   6/3: Vital stable.  GI pathogen panel positive for Campylobacter species.  She was started on azithromycin.  UA positive for nitrites, RBCs and leukocytes. Urine culture ordered.  Patient with no urinary symptoms, holding antibiotics for now.  History of ESBL infection. PT OT are recommending SNF   6/4: Continue to have watery diarrhea.  She does not want to go to rehab and will be going home with home health once diarrhea improved.  Slight worsening of creatinine to 1.36. Encouraging p.o. hydration.  Urine cultures with Klebsiella oxytoca and Citrobacter-can be colonization as patient does not have urinary symptoms.  6/5: patient had 2 loose BMs overnight. Denies abdominal pain or bloating. States she is nauseous and unable to tolerate PO this morning.   6/6: Patient still continues to have loose bowels but states t the volume of diarrhea has decreased   Assessment and  Plan: Colitis due to Campylobacter species Sespsis criteria on admission of tachycardia and hypotension with GI infection now resolved.  Patient presented with nausea, vomiting and watery diarrhea with history of recent C. difficile infection.  Repeat C. difficile PCR negative but GI pathogen panel positive for Campylobacter. Rectal tube was placed at patient request and has now discontinued.  -Continue with Zithromax -Continue with supportive care -Abdominal pain resolved, still has loose stools able to tolerate diet but the volume of diarrhea has decreased - consult ID if fails to improve further  History of Clostridioides difficile colitis Repeat C. difficile PCR negative. -Continue with vancomycin taper  Hypotension -Continue home midodrine -BP borderline but asymptomatic  Arthritis or polyarthritis, rheumatoid (HCC)- significant deformity of hands. Does not appear to be taking and DMARDs at home.  Continue home gabapentin and oxycodone  CKD stage 3b, GFR 30-44 ml/min (HCC) Creatinine around baseline. -Monitor renal function -Avoid nephrotoxins  Hypokalemia - monitor and replete PRN  Difficulty walking Pt reports being bedbound since October 2023 right hip surgery.  She is having difficulty bearing weight on her right leg.  Has some right ankle swelling.   - PT / OT evaluate and treat    Subjective: Patient reports "continuous" continuous bowel movements. She has had 2 overnight. She described them as somewhere between watery and soft. Denies abdominal pain currently but has nausea and unable to tolerate breakfast. Denies seeing blood in stool.  Physical Exam: Vitals:   02/13/23 1633 02/13/23 2214 02/14/23 0449 02/14/23 0854  BP: (!) 107/53 120/66 (!) 102/56 (!) 103/58  Pulse:  80 76 82 90  Resp: 18 16 18 18   Temp: 98 F (36.7 C) 98.1 F (36.7 C) 97.7 F (36.5 C) 99.2 F (37.3 C)  TempSrc:  Oral  Oral  SpO2: 100% 100% 99% 99%  Weight:      Height:       General.   Frail elderly lady, in mild distress. Pulmonary.  Lungs clear bilaterally, normal respiratory effort. CV.  Regular rate and rhythm, no JVD, rub or murmur. Abdomen.  Soft, nontender, nondistended, BS positive. CNS.  Alert and oriented .  No focal neurologic deficit. Extremities.  No edema, no cyanosis, pulses intact and symmetrical. Significant deformity of bilateral hands from arthritis  Psychiatry.  Judgment and insight appears normal.   Data Reviewed: Prior data reviewed.  Family Communication: family member asleep at bedside  Disposition: Status is: Inpatient due to: Severity of illness  Planned Discharge Destination: Home with Home Health  DVT prophylaxis.  Lovenox Time spent: 45 minutes  Author: Harold Hedge, MD 02/14/2023 3:55 PM  For on call review [email or web address].

## 2023-02-14 NOTE — Progress Notes (Signed)
OT Cancellation Note  Patient Details Name: Jeanne [name] MRN: [id number] DOB: 1951/02/10   Cancelled Treatment:    Reason Eval/Treat Not Completed: Patient declined, no reason specified. Pt declining therapy this date despite encouragement. Will re-attempt at later date/time as able.   Arman Filter., MPH, MS, OTR/L ascom [phone] 02/14/23, 2:44 PM

## 2023-02-15 LAB — CULTURE, BLOOD (ROUTINE X 2): Culture: NO GROWTH

## 2023-02-15 MED ORDER — LOPERAMIDE HCL 2 MG PO CAPS
2.0000 mg | ORAL_CAPSULE | Freq: Three times a day (TID) | ORAL | Status: DC
  Administered 2023-02-15 – 2023-02-17 (×7): 2 mg via ORAL
  Filled 2023-02-15 (×7): qty 1

## 2023-02-15 NOTE — Progress Notes (Signed)
PT Cancellation Note  Patient Details Name: Jeanne [name] MRN: [id number] DOB: [birth year]   Cancelled Treatment:    Reason Eval/Treat Not Completed: Other (comment).  Pt resting in bed upon PT arrival; pt reporting ongoing nausea (nurse notified).  Pt reports plan to participate in therapy once she returns home but declining therapy at this time.  Pt has declined therapy the last 3 days (today, 02/14/23, and 02/13/23).  D/t pt refusing therapy 3 days in a row, will sign off at this time (pt appearing in agreement and will let MD know when she is able to/agreeable to re-initiating therapy services).  Nurse, MD, and TOC updated.  Hendricks Limes, PT 02/15/23, 2:20 PM

## 2023-02-15 NOTE — Progress Notes (Addendum)
OT Cancellation Note  Patient Details Name: Jeanne [name] MRN: [id number] DOB: 1950/10/08   Cancelled Treatment:    Reason Eval/Treat Not Completed: Patient declined, no reason specified;Pain limiting ability to participate;Other (comment). Pt continues to decline participation in therapy, citing severe pain and nausea. Pt endorses that she has not let nursing know when she is in pain and nauseated and instead attempts to sleep through it. Pt encouraged to let nursing know so that we can help her better manage symptoms. RN notified of current symptoms. Pt in agreement to let the MD know once she's feeling better and agreeable to re-initiating therapy services. Will sign off at this time.   Arman Filter., MPH, MS, OTR/L ascom [phone] 02/15/23, 9:51 AM

## 2023-02-15 NOTE — TOC Progression Note (Signed)
Transition of Care [facility]) - Progression Note    Patient Details  Name: Jeanne [name] MRN: [id number] Date of Birth: 1950-11-06  [facility] of [facility] CM/SW Contact  Margarito Liner, LCSW Phone Number: 02/15/2023, 12:00 PM  Clinical Narrative:  TOC continues to follow for return home with home health.   Expected Discharge Plan: Home w Home Health Services Barriers to Discharge: Continued Medical Work up  Expected Discharge Plan and Services                                               Social Determinants of Health (SDOH) Interventions SDOH Screenings   Food Insecurity: No Food Insecurity (12/28/2022)  Housing: Low Risk  (12/28/2022)  Transportation Needs: No Transportation Needs (12/28/2022)  Utilities: Not At Risk (12/28/2022)  Alcohol Screen: Low Risk  (08/10/2020)  Depression (PHQ2-9): Low Risk  (08/10/2020)  Financial Resource Strain: Low Risk  (08/10/2020)  Physical Activity: Inactive (08/10/2020)  Social Connections: Moderately Integrated (08/10/2020)  Stress: No Stress Concern Present (08/10/2020)  Tobacco Use: Medium Risk (02/10/2023)    Readmission Risk Interventions    01/02/2023   12:24 PM  Readmission Risk Prevention Plan  Transportation Screening Complete  PCP or Specialist Appt within 3-5 Days Complete  Social Work Consult for Recovery Care Planning/Counseling Complete  Palliative Care Screening Not Applicable  Medication Review [employer]) Complete

## 2023-02-15 NOTE — Progress Notes (Addendum)
Progress Note   Patient: Jeanne [name] WJX:[id number] DOB: [birth year] DOA: 02/10/2023     4 DOS: the patient was seen and examined on 02/15/2023   Brief hospital course:  THEODORA LALANNE is a 72 y.o. female with medical history significant of juvenile rheumatoid arthritis, hypertension, CKD, cervical cancer, C. Difficile- currently on vancomycin taper who presented for nausea, vomiting, diarrhea for the past few days.   She was recently hospitalized for C. Diff with pancolitis and COVID.  Endorses fatigue and decreased appetite. Has had RLE pain and swelling since her hip replacement. Mostly bed-bound. She has been continuing her Vancomycin taper.    ED course and data reviewed.  Mildly tachycardic otherwise stable vital.  Later blood pressure was little soft so she was given 5 mg of midodrine and 500 cc NS bolus.  Labs pertinent for leukocytosis at 18, C. difficile coag screen was unremarkable.  GI pathogen panel and blood cultures collected.  Lipase and lactic acid were normal. CT abdomen and pelvis showed colitis.   6/3: Vital stable.  GI pathogen panel positive for Campylobacter species.  She was started on azithromycin.  UA positive for nitrites, RBCs and leukocytes. Urine culture ordered.  Patient with no urinary symptoms, holding antibiotics for now.  History of ESBL infection. PT OT are recommending SNF   6/4: Continue to have watery diarrhea.  She does not want to go to rehab and will be going home with home health once diarrhea improved.  Slight worsening of creatinine to 1.36. Encouraging p.o. hydration.  Urine cultures with Klebsiella oxytoca and Citrobacter-can be colonization as patient does not have urinary symptoms.  6/5: patient had 2 loose BMs overnight. Denies abdominal pain or bloating. States she is nauseous and unable to tolerate PO this morning.   6/6: Patient still continues to have loose bowels but states t the volume of diarrhea has decreased   Assessment and  Plan: Colitis due to Campylobacter species Sespsis criteria on admission of tachycardia and hypotension with GI infection now resolved.  Patient presented with nausea, vomiting and watery diarrhea with history of recent C. difficile infection.  Repeat C. difficile PCR negative but GI pathogen panel positive for Campylobacter. Rectal tube was placed at patient request and has now discontinued.  -s/p 4 doses azithromycin -Continue with supportive care -Abdominal pain resolved, still has loose stools able to tolerate diet but the volume of diarrhea has decreased - patient reports not feeling ready for d/c today but I told her to plan on d/c tomorrow and she agrees - ill schedule loperamide today, has it ordered prn but hasn't been receiving it  History of Clostridioides difficile colitis Repeat C. difficile PCR negative. -last dose of outpatient vanc taper given today  Hypotension -Continue home midodrine -BP borderline but asymptomatic  Arthritis or polyarthritis, rheumatoid (HCC)- significant deformity of hands. Does not appear to be taking and DMARDs at home.  Continue home gabapentin and oxycodone  CKD stage 3b, GFR 30-44 ml/min (HCC) Creatinine around baseline. -Monitor renal function -Avoid nephrotoxins  Hypokalemia - monitor and replete PRN  Difficulty walking Pt reports being bedbound since October 2023 right hip surgery.  She is having difficulty bearing weight on her right leg.  Has some right ankle swelling.  Pt/ot advises snf but patient declines this   Subjective: reports 2-3 loose but not watery or bloody bms in last 24 but also small amount of stool incontinence with flatus, no abd pain or fever, tolerating diet  Physical Exam: Vitals:  02/14/23 0854 02/14/23 2126 02/15/23 0428 02/15/23 0757  BP: (!) 103/58 (!) 109/54 (!) 111/59 111/64  Pulse: 90 78 81 86  Resp: 18 18 18 20   Temp: 99.2 F (37.3 C) 98.2 F (36.8 C) 98.4 F (36.9 C) 98.3 F (36.8 C)  TempSrc:  Oral Oral Oral Oral  SpO2: 99% 100% 98% 100%  Weight:      Height:       General.  Frail elderly lady, in mild distress. Pulmonary.  Lungs clear bilaterally, normal respiratory effort. CV.  Regular rate and rhythm, no JVD, rub or murmur. Abdomen.  Soft, nontender, nondistended, BS positive. CNS.  Alert and oriented .  No focal neurologic deficit. Extremities.  No edema, no cyanosis, pulses intact and symmetrical. Significant deformity of bilateral hands from arthritis  Psychiatry.  Judgment and insight appears normal.   Data Reviewed: Prior data reviewed.  Family Communication: none at bedside  Disposition: Status is: Inpatient due to: Severity of illness  Planned Discharge Destination: Home with Home Health  DVT prophylaxis.  Lovenox Time spent: 45 minutes  Author: Silvano Bilis, MD 02/15/2023 11:40 AM  For on call review [email or web address].

## 2023-02-16 DIAGNOSIS — R110 Nausea: Secondary | ICD-10-CM

## 2023-02-16 LAB — BASIC METABOLIC PANEL
Anion gap: 9 (ref 5–15)
BUN: 12 mg/dL (ref 8–23)
CO2: 21 mmol/L — ABNORMAL LOW (ref 22–32)
Calcium: 8.6 mg/dL — ABNORMAL LOW (ref 8.9–10.3)
Chloride: 107 mmol/L (ref 98–111)
Creatinine, Ser: 1.22 mg/dL — ABNORMAL HIGH (ref 0.44–1.00)
GFR, Estimated: 48 mL/min — ABNORMAL LOW (ref >60)
Glucose, Bld: 110 mg/dL — ABNORMAL HIGH (ref 70–99)
Potassium: 3.9 mmol/L (ref 3.5–5.1)
Sodium: 137 mmol/L (ref 135–145)

## 2023-02-16 LAB — CBC
HCT: 33.8 % — ABNORMAL LOW (ref 36.0–46.0)
Hemoglobin: 10.3 g/dL — ABNORMAL LOW (ref 12.0–15.0)
MCH: 27.4 pg (ref 26.0–34.0)
MCHC: 30.5 g/dL (ref 30.0–36.0)
MCV: 89.9 fL (ref 80.0–100.0)
Platelets: 266 10*3/uL (ref 150–400)
RBC: 3.76 MIL/uL — ABNORMAL LOW (ref 3.87–5.11)
RDW: 15.9 % — ABNORMAL HIGH (ref 11.5–15.5)
WBC: 10.1 10*3/uL (ref 4.0–10.5)
nRBC: 0 % (ref 0.0–0.2)

## 2023-02-16 MED ORDER — SODIUM CHLORIDE 0.9 % IV SOLN: 12.5000 mg | Freq: Four times a day (QID) | INTRAVENOUS | Status: DC | PRN

## 2023-02-16 MED ORDER — SCOPOLAMINE 1 MG/3DAYS TD PT72
1.0000 | MEDICATED_PATCH | TRANSDERMAL | Status: DC
  Administered 2023-02-16: 1.5 mg via TRANSDERMAL
  Filled 2023-02-16: qty 1

## 2023-02-16 MED ORDER — CALCIUM CARBONATE ANTACID 500 MG PO CHEW: 1.0000 | CHEWABLE_TABLET | Freq: Three times a day (TID) | ORAL | Status: DC | PRN

## 2023-02-16 NOTE — Progress Notes (Signed)
PROGRESS NOTE    Jeanne [name]  ZOX:[id number] DOB: 1951-03-01 DOA: 02/10/2023 PCP: Jerl Mina, MD    Assessment & Plan:   Principal Problem:   Colitis due to Campylobacter species Active Problems:   History of Clostridioides difficile colitis   Hypotension   Asymptomatic bacteriuria   Arthritis or polyarthritis, rheumatoid (HCC)   CKD stage 3b, GFR 30-44 ml/min (HCC)   Difficulty walking   Nausea vomiting and diarrhea   Elevated alkaline phosphatase level   SIRS (systemic inflammatory response syndrome) (HCC)   Diarrhea of presumed infectious origin  Assessment and Plan: Colitis: due to Campylobacter species. Abx course completed. Continue w/ supportive care   Nausea: intractable. Zofran prn but w/ little relief. Started on scopolamine patch & phenergan prn   Sepsis: met criteria on admission w/ tachycardia, hypotension & colitis. Resolved.    Hx of c.diff colitis: repeat c. diff PCR is neg. Completed po vanco course    Hypotension: continue on home dose of midodrine    RA: w/ significant deformity of hands. Not taking DMARDs at home. Continue on home dose of oxycodone, gabapentin   CKDIIIb: Cr is around baseline. Avoid nephrotoxic meds    Hypokalemia: WNL today    Difficulty walking: being bedbound since October 2023 right hip surgery. PT/OT recs SNF but pt refuses   ACD: likely secondary to CKD. No need for a transfusion currently      DVT prophylaxis: lovenox  Code Status: full  Family Communication:  Disposition Plan: likely d/c home tomorrow  Level of care: Med-Surg Status is: Inpatient Remains inpatient appropriate because: intractable nausea      Consultants:    Procedures:   Antimicrobials:   Subjective: Pt c/o nausea   Objective: Vitals:   02/15/23 0757 02/15/23 1505 02/15/23 2037 02/16/23 0819  BP: 111/64 (!) 121/55 (!) 121/55 (!) 106/56  Pulse: 86 75 91 86  Resp: 20 20 20 16   Temp: 98.3 F (36.8 C) 98.3 F (36.8 C) 98.6 F  (37 C) 98.4 F (36.9 C)  TempSrc: Oral Oral    SpO2: 100% 100% 99% 100%  Weight:      Height:       No intake or output data in the 24 hours ending 02/16/23 0848 Filed Weights   02/10/23 1115  Weight: 58.5 kg    Examination:  General exam: Appears calm and comfortable  Respiratory system: Clear to auscultation. Respiratory effort normal. Cardiovascular system: S1 & S2 +. No rubs, gallops or clicks Gastrointestinal system: Abdomen is nondistended, soft and nontender. Hypoactive bowel sounds heard. Central nervous system: Alert and oriented.  Psychiatry: Judgement and insight appear normal. Flat mood and affect.     Data Reviewed: I have personally reviewed following labs and imaging studies  CBC: Recent Labs  Lab 02/10/23 1117 02/11/23 0825 02/12/23 0946 02/16/23 0533  WBC 18.0* 7.7 9.1 10.1  HGB 11.9* 9.8* 10.5* 10.3*  HCT 39.5 31.6* 34.9* 33.8*  MCV 90.4 89.0 92.1 89.9  PLT 384 299 326 266   Basic Metabolic Panel: Recent Labs  Lab 02/11/23 0825 02/12/23 0946 02/13/23 0502 02/14/23 0957 02/16/23 0533  NA 137 137 136 139 137  K 3.9 3.5 3.1* 4.1 3.9  CL 110 107 108 111 107  CO2 20* 24 21* 20* 21*  GLUCOSE 94 111* 112* 129* 110*  BUN 20 15 13 11 12   CREATININE 1.24* 1.36* 1.43* 1.20* 1.22*  CALCIUM 8.3* 8.5* 8.2* 8.6* 8.6*  MG  --   --   --  1.8  --    GFR: Estimated Creatinine Clearance: 37.1 mL/min (A) (by C-G formula based on SCr of 1.22 mg/dL (H)). Liver Function Tests: Recent Labs  Lab 02/10/23 1117 02/11/23 0825  AST 23 18  ALT 21 17  ALKPHOS 170* 139*  BILITOT 0.7 0.5  PROT 6.8 6.0*  ALBUMIN 3.0* 2.5*   Recent Labs  Lab 02/10/23 1117  LIPASE 27   No results for input(s): "AMMONIA" in the last 168 hours. Coagulation Profile: Recent Labs  Lab 02/10/23 1538  INR 1.2   Cardiac Enzymes: No results for input(s): "CKTOTAL", "CKMB", "CKMBINDEX", "TROPONINI" in the last 168 hours. BNP (last 3 results) No results for input(s): "PROBNP"  in the last 8760 hours. HbA1C: No results for input(s): "HGBA1C" in the last 72 hours. CBG: No results for input(s): "GLUCAP" in the last 168 hours. Lipid Profile: No results for input(s): "CHOL", "HDL", "LDLCALC", "TRIG", "CHOLHDL", "LDLDIRECT" in the last 72 hours. Thyroid Function Tests: No results for input(s): "TSH", "T4TOTAL", "FREET4", "T3FREE", "THYROIDAB" in the last 72 hours. Anemia Panel: No results for input(s): "VITAMINB12", "FOLATE", "FERRITIN", "TIBC", "IRON", "RETICCTPCT" in the last 72 hours. Sepsis Labs: Recent Labs  Lab 02/10/23 1520 02/10/23 1538  LATICACIDVEN 1.5 1.7    Recent Results (from the past 240 hour(s))  C Difficile Quick Screen w PCR reflex     Status: None   Collection Time: 02/10/23  3:20 PM   Specimen: Stool  Result Value Ref Range Status   C Diff antigen NEGATIVE NEGATIVE Final   C Diff toxin NEGATIVE NEGATIVE Final   C Diff interpretation No C. difficile detected.  Final    Comment: Performed at [facility], [address] [place], [place] [address]  Gastrointestinal Panel by PCR , Stool     Status: Abnormal   Collection Time: 02/10/23  3:20 PM   Specimen: Stool  Result Value Ref Range Status   Campylobacter species DETECTED (A) NOT DETECTED Final    Comment: RESULT CALLED TO, READ BACK BY AND VERIFIED WITH:  RACHEL MERKLE 02/10/2023 2018 CP     Plesimonas shigelloides NOT DETECTED NOT DETECTED Final   Salmonella species NOT DETECTED NOT DETECTED Final   Yersinia enterocolitica NOT DETECTED NOT DETECTED Final   Vibrio species NOT DETECTED NOT DETECTED Final   Vibrio cholerae NOT DETECTED NOT DETECTED Final   Enteroaggregative E coli (EAEC) NOT DETECTED NOT DETECTED Final   Enteropathogenic E coli (EPEC) NOT DETECTED NOT DETECTED Final   Enterotoxigenic E coli (ETEC) NOT DETECTED NOT DETECTED Final   Shiga like toxin producing E coli (STEC) NOT DETECTED NOT DETECTED Final   Shigella/Enteroinvasive E coli (EIEC) NOT DETECTED NOT  DETECTED Final   Cryptosporidium NOT DETECTED NOT DETECTED Final   Cyclospora cayetanensis NOT DETECTED NOT DETECTED Final   Entamoeba histolytica NOT DETECTED NOT DETECTED Final   Giardia lamblia NOT DETECTED NOT DETECTED Final   Adenovirus F40/41 NOT DETECTED NOT DETECTED Final   Astrovirus NOT DETECTED NOT DETECTED Final   Norovirus GI/GII NOT DETECTED NOT DETECTED Final   Rotavirus A NOT DETECTED NOT DETECTED Final   Sapovirus (I, II, IV, and V) NOT DETECTED NOT DETECTED Final    Comment: Performed at [facility], [address] [place], [place] [address]  Blood Culture (routine x 2)     Status: None   Collection Time: 02/10/23  3:20 PM   Specimen: BLOOD  Result Value Ref Range Status   Specimen Description BLOOD BLOOD RIGHT ARM  Final  Special Requests   Final    BOTTLES DRAWN AEROBIC AND ANAEROBIC Blood Culture results may not be optimal due to an inadequate volume of blood received in culture bottles   Culture   Final    NO GROWTH 5 DAYS Performed at [facility], [address] [place], [place] [address]    Report Status 02/15/2023 FINAL  Final  Blood Culture (routine x 2)     Status: None   Collection Time: 02/10/23  3:38 PM   Specimen: BLOOD  Result Value Ref Range Status   Specimen Description BLOOD LEFT ANTECUBITAL  Final   Special Requests   Final    BOTTLES DRAWN AEROBIC AND ANAEROBIC Blood Culture adequate volume   Culture   Final    NO GROWTH 5 DAYS Performed at [facility], [address], [place], [place] [address]    Report Status 02/15/2023 FINAL  Final  Urine Culture (for pregnant, neutropenic or urologic patients or patients with an indwelling urinary catheter)     Status: Abnormal   Collection Time: 02/10/23  6:14 PM   Specimen: Urine, Clean Catch  Result Value Ref Range Status   Specimen Description   Final    URINE, CLEAN CATCH Performed at [facility], [address] [place], [place] [address]     Special Requests   Final    NONE Performed at [facility], [address], [place], [place] [address]    Culture (A)  Final    >=100,000 COLONIES/mL KLEBSIELLA OXYTOCA 30,000 COLONIES/mL CITROBACTER FREUNDII Confirmed Extended Spectrum Beta-Lactamase Producer (ESBL).  In bloodstream infections from ESBL organisms, carbapenems are preferred over piperacillin/tazobactam. They are shown to have a lower risk of mortality. FOR KLEBSIELLA OXYTOCA Performed at [facility], [address], [place], [place] [address]    Report Status 02/13/2023 FINAL  Final   Organism ID, Bacteria KLEBSIELLA OXYTOCA (A)  Final   Organism ID, Bacteria CITROBACTER FREUNDII (A)  Final      Susceptibility   Citrobacter freundii - MIC*    CEFEPIME <=0.12 SENSITIVE Sensitive     CEFTRIAXONE <=0.25 SENSITIVE Sensitive     CIPROFLOXACIN <=0.25 SENSITIVE Sensitive     GENTAMICIN <=1 SENSITIVE Sensitive     IMIPENEM <=0.25 SENSITIVE Sensitive     NITROFURANTOIN <=16 SENSITIVE Sensitive     TRIMETH/SULFA <=20 SENSITIVE Sensitive     PIP/TAZO <=4 SENSITIVE Sensitive     * 30,000 COLONIES/mL CITROBACTER FREUNDII   Klebsiella oxytoca - MIC*    AMPICILLIN >=32 RESISTANT Resistant     CEFEPIME >=32 RESISTANT Resistant     CEFTRIAXONE >=64 RESISTANT Resistant     CIPROFLOXACIN 1 RESISTANT Resistant     GENTAMICIN >=16 RESISTANT Resistant     IMIPENEM 0.5 SENSITIVE Sensitive     NITROFURANTOIN 64 INTERMEDIATE Intermediate     TRIMETH/SULFA >=320 RESISTANT Resistant     AMPICILLIN/SULBACTAM >=32 RESISTANT Resistant     PIP/TAZO 8 SENSITIVE Sensitive     * >=100,000 COLONIES/mL KLEBSIELLA OXYTOCA         Radiology Studies: No results found.      Scheduled Meds:  cyanocobalamin  1,000 mcg Oral Daily   enoxaparin (LOVENOX) injection  40 mg Subcutaneous Q24H   gabapentin  100 mg Oral QHS   loperamide  2 mg Oral TID   midodrine  5 mg Oral TID WC   multivitamin with minerals  1 tablet Oral  Daily   saccharomyces boulardii  250 mg Oral BID  zinc sulfate  220 mg Oral Daily   Continuous Infusions:   LOS: 5 days    Time spent: 25 mins     Charise Killian, MD [facility] Pager [phone]  If 7PM-7AM, please contact night-coverage [email or web address] 02/16/2023, 8:48 AM

## 2023-02-17 DIAGNOSIS — R531 Weakness: Secondary | ICD-10-CM | POA: Diagnosis not present

## 2023-02-17 DIAGNOSIS — Z743 Need for continuous supervision: Secondary | ICD-10-CM | POA: Diagnosis not present

## 2023-02-17 DIAGNOSIS — I959 Hypotension, unspecified: Secondary | ICD-10-CM | POA: Diagnosis not present

## 2023-02-17 LAB — BASIC METABOLIC PANEL
Anion gap: 8 (ref 5–15)
BUN: 14 mg/dL (ref 8–23)
CO2: 20 mmol/L — ABNORMAL LOW (ref 22–32)
Calcium: 8.2 mg/dL — ABNORMAL LOW (ref 8.9–10.3)
Chloride: 109 mmol/L (ref 98–111)
Creatinine, Ser: 1.19 mg/dL — ABNORMAL HIGH (ref 0.44–1.00)
GFR, Estimated: 49 mL/min — ABNORMAL LOW (ref >60)
Glucose, Bld: 108 mg/dL — ABNORMAL HIGH (ref 70–99)
Potassium: 3.5 mmol/L (ref 3.5–5.1)
Sodium: 137 mmol/L (ref 135–145)

## 2023-02-17 LAB — CBC
HCT: 31.9 % — ABNORMAL LOW (ref 36.0–46.0)
Hemoglobin: 9.9 g/dL — ABNORMAL LOW (ref 12.0–15.0)
MCH: 27.8 pg (ref 26.0–34.0)
MCHC: 31 g/dL (ref 30.0–36.0)
MCV: 89.6 fL (ref 80.0–100.0)
Platelets: 278 10*3/uL (ref 150–400)
RBC: 3.56 MIL/uL — ABNORMAL LOW (ref 3.87–5.11)
RDW: 16 % — ABNORMAL HIGH (ref 11.5–15.5)
WBC: 10 10*3/uL (ref 4.0–10.5)
nRBC: 0 % (ref 0.0–0.2)

## 2023-02-17 MED ORDER — SACCHAROMYCES BOULARDII 250 MG PO CAPS: 250.0000 mg | ORAL_CAPSULE | Freq: Two times a day (BID) | ORAL | 0 refills | Status: AC

## 2023-02-17 MED ORDER — ORAL CARE MOUTH RINSE: 15.0000 mL | OROMUCOSAL | Status: DC | PRN

## 2023-02-17 MED ORDER — PROMETHAZINE HCL 25 MG PO TABS
12.5000 mg | ORAL_TABLET | Freq: Four times a day (QID) | ORAL | 0 refills | Status: DC | PRN
Start: 2023-02-17 — End: 2023-06-02

## 2023-02-17 NOTE — TOC Transition Note (Signed)
Transition of Care [facility]) - CM/SW Discharge Note   Patient Details  Name: Jeanne [name] MRN: [id number] Date of Birth: Dec 08, 1950  [facility] CM/SW Contact:  Colette Ribas, LCSWA Phone Number: 02/17/2023, 12:21 PM   Clinical Narrative:     CSW collaborated with [facility] patient with continue services with [facility]. Patient will be transporting home via EMS. CSW spoke with spouse to inform of transport.  Final next level of care: Home w Home Health Services Barriers to Discharge: No Barriers Identified   Patient Goals and CMS Choice CMS Medicare.gov Compare Post Acute Care list provided to:: Patient    Discharge Placement                    Name of family member notified: [place] Pink Patient and family notified of of transfer: 02/17/23  Discharge Plan and Services Additional resources added to the After Visit Summary for                            [facility] Arranged: PT, OT, Nurse's Aide HH Agency: [facility] Date [facility] Agency Contacted: 02/17/23 Time HH Agency Contacted: 1157 Representative spoke with at [facility] Agency: Laurelyn Sickle  Social Determinants of Health (SDOH) Interventions SDOH Screenings   Food Insecurity: No Food Insecurity (02/16/2023)  Housing: Low Risk  (02/16/2023)  Transportation Needs: No Transportation Needs (02/16/2023)  Utilities: Not At Risk (02/16/2023)  Alcohol Screen: Low Risk  (08/10/2020)  Depression (PHQ2-9): Low Risk  (08/10/2020)  Financial Resource Strain: Low Risk  (08/10/2020)  Physical Activity: Inactive (08/10/2020)  Social Connections: Moderately Integrated (08/10/2020)  Stress: No Stress Concern Present (08/10/2020)  Tobacco Use: Medium Risk (02/10/2023)     Readmission Risk Interventions    01/02/2023   12:24 PM  Readmission Risk Prevention Plan  Transportation Screening Complete  PCP or Specialist Appt within 3-5 Days Complete  Social Work Consult for Recovery Care Planning/Counseling Complete   Palliative Care Screening Not Applicable  Medication Review Oceanographer) Complete

## 2023-02-17 NOTE — Progress Notes (Signed)
Patient discharged home. Awaiting EMS pickup.

## 2023-02-17 NOTE — Discharge Summary (Signed)
Physician Discharge Summary  [name] GNF:[id number] DOB: 26-Oct-1950 DOA: 02/10/2023  PCP: Jerl Mina, MD  Admit date: 02/10/2023 Discharge date: 02/17/2023  Admitted From: home  Disposition:  home w/ home health   Recommendations for Outpatient Follow-up:  Follow up with PCP w/in 1 week   Home Health: yes Equipment/Devices:  Discharge Condition: stable  CODE STATUS: full  Diet recommendation: Regular   Brief/Interim Summary: HPI was taken from Dr. Rachael Darby: Jeanne [name] is a 72 y.o. female with medical history significant of juvenile rheumatoid arthritis, hypertension, CKD, cervical cancer, C. Difficile who presented for nausea, vomiting, diarrhea.   [place] called EMS due  generalized abdominal pain, nausea, vomiting and nonbloody watery diarrhea for the past few days. Reports diarrheal episodes twice an hour.  She was recently hospitalized for C. Diff with pancolitis and COVID.  Endorses fatigue and decreased appetite. Has had RLE pain and swelling since her hip replacement.  Denies fever, dyspnea, chest pain. She has been continuing her Vancomycin taper. EMS gave her 4 mg of Zofran and 210 cc of normal saline.     Initial ED vitals revealed, afebrile but is tachycardic and satting 95% on room air.  Blood pressures were soft at 98/64.  She was given 5 mg of midodrine, 4 mg of Zofran, 500 cc bolus NS and 5 mg of oxycodone.  Initial CBG 121.   She has leukocytosis (WBC 18) and blood cultures were obtained.  C. difficile quick screen was unremarkable.  GI pathogen panel collected.  Lipase and lactic acid were normal.  Urinalysis has not yet resulted.   CMP revealing bicarb 21, glucose 121, BUN 26, creati reports being bedbound since October right hip surgery and has chronic nine 1.22, calcium 8.4, albumin 3.9, ALP 170, remaining results are normal. EDP placed a rectal tube at pt's request. CT abdomen pelvis in the ED showed colitis.  ED provider consulted hospitalist team for  evaluation for admission.  Discharge Diagnoses:  Principal Problem:   Colitis due to Campylobacter species Active Problems:   History of Clostridioides difficile colitis   Hypotension   Asymptomatic bacteriuria   Arthritis or polyarthritis, rheumatoid (HCC)   CKD stage 3b, GFR 30-44 ml/min (HCC)   Difficulty walking   Nausea vomiting and diarrhea   Elevated alkaline phosphatase level   SIRS (systemic inflammatory response syndrome) (HCC)   Diarrhea of presumed infectious origin  Colitis: due to Campylobacter species. Abx course completed. Continue w/ supportive care    Nausea: zofran prn but w/ little relief.  Much improved w/ phenergan    Sepsis: met criteria on admission w/ tachycardia, hypotension & colitis. Resolved.    Hx of c.diff colitis: repeat c. diff PCR is neg. Completed po vanco course    Hypotension: continue on home dose of midodrine    RA: w/ significant deformity of hands. Not taking DMARDs at home. Continue on home dose of oxycodone, gabapentin    CKDIIIb: Cr is around baseline. Avoid nephrotoxic meds    Hypokalemia: WNL today    Difficulty walking: being bedbound since October 2023 right hip surgery. PT/OT recs SNF but pt refuses. Will continue w/ previous home health    ACD: likely secondary to CKD. No need for a transfusion currently   Discharge Instructions  Discharge Instructions     Diet general   Complete by: As directed    Discharge instructions   Complete by: As directed    F/u w/ PCP w/in 1 week.   Increase activity slowly  Complete by: As directed       Allergies as of 02/17/2023       Reactions   Codeine Nausea Only, Nausea And Vomiting   Augmentin [amoxicillin-pot Clavulanate] Nausea Only   Very nauseated/ feels sore on body         Medication List     STOP taking these medications    vancomycin 125 MG capsule Commonly known as: VANCOCIN       TAKE these medications    cyanocobalamin 1000 MCG tablet Take 1 tablet  (1,000 mcg total) by mouth daily.   diclofenac 75 MG EC tablet Commonly known as: VOLTAREN Take 75 mg by mouth 2 (two) times daily.   gabapentin 100 MG capsule Commonly known as: NEURONTIN Take 100 mg by mouth at bedtime.   lidocaine-prilocaine cream Commonly known as: EMLA APPLY SPARINGLY 2-3 TIMES A DAY TO AREA OF RADIATION THERAPY IRRITATION.   midodrine 5 MG tablet Commonly known as: PROAMATINE Take 1 tablet (5 mg total) by mouth 3 (three) times daily as needed (Systolic BP less than 100 mmHg).   multivitamin with minerals Tabs tablet Take 1 tablet by mouth daily.   oxyCODONE 5 MG immediate release tablet Commonly known as: Oxy IR/ROXICODONE Take 1 tablet (5 mg total) by mouth every 6 (six) hours as needed.   promethazine 25 MG tablet Commonly known as: PHENERGAN Take 0.5 tablets (12.5 mg total) by mouth every 6 (six) hours as needed for up to 14 days for nausea or vomiting.   saccharomyces boulardii 250 MG capsule Commonly known as: FLORASTOR Take 1 capsule (250 mg total) by mouth 2 (two) times daily for 28 days.   tretinoin 0.025 % cream Commonly known as: RETIN-A Apply topically at bedtime.   Vitamin D (Ergocalciferol) 1.25 MG (50000 UNIT) Caps capsule Commonly known as: DRISDOL Take 1 capsule (50,000 Units total) by mouth every 7 (seven) days.   zinc sulfate 220 (50 Zn) MG capsule Take 1 capsule (220 mg total) by mouth daily for 28 days.        Allergies  Allergen Reactions   Codeine Nausea Only and Nausea And Vomiting   Augmentin [Amoxicillin-Pot Clavulanate] Nausea Only    Very nauseated/ feels sore on body     Consultations:    Procedures/Studies: CT ABDOMEN PELVIS W CONTRAST  Result Date: 02/10/2023 CLINICAL DATA:  Nausea vomiting and diarrhea. EXAM: CT ABDOMEN AND PELVIS WITH CONTRAST TECHNIQUE: Multidetector CT imaging of the abdomen and pelvis was performed using the standard protocol following bolus administration of intravenous contrast.  RADIATION DOSE REDUCTION: This exam was performed according to the departmental dose-optimization program which includes automated exposure control, adjustment of the mA and/or kV according to patient size and/or use of iterative reconstruction technique. CONTRAST:  80mL OMNIPAQUE IOHEXOL 300 MG/ML  SOLN COMPARISON:  CT abdomen and pelvis 12/28/2022 FINDINGS: Lower chest: No acute abnormality. Hepatobiliary: Gallbladder sludge or layering stones present. No biliary ductal dilatation. There is diffuse fatty infiltration of the liver, unchanged. Pancreas: Unremarkable. No pancreatic ductal dilatation or surrounding inflammatory changes. Spleen: Borderline enlarged, unchanged. Adrenals/Urinary Tract: The bladder is not well seen secondary to streak artifact in the pelvis. Chronic left renal cortical thinning and atrophy again noted. Chronic appearing moderate left hydroureteronephrosis is also unchanged. Small right peripelvic cysts and cortical cysts are again noted. Right renal cortical scarring is stable. There are punctate nonobstructing right renal calculi. The adrenal glands are within normal limits. Stomach/Bowel: Evaluation of bowel loops in the pelvis limited secondary to  streak artifact. There is no bowel obstruction, pneumatosis or free air. There is mild wall thickening and submucosal enhancement of the distal descending colon compatible with colitis. Rectal tube in place. Scattered air-fluid levels are seen throughout the colon. The appendix is not visualized. Small bowel and stomach are within normal limits. Vascular/Lymphatic: No significant vascular findings are present. No enlarged abdominal or pelvic lymph nodes. There surgical clips in the bilateral iliac chain regions. Reproductive: Not well evaluated secondary to streak artifact in the pelvis. Other: No abdominal wall hernia or abnormality. No abdominopelvic ascites. Musculoskeletal: Bilateral hip arthroplasties are again noted. The bones are  osteopenic. Compression deformities of T12, L1 and L5 appear stable. IMPRESSION: 1. Mild wall thickening and submucosal enhancement of the distal descending colon compatible with colitis. 2. Scattered air-fluid levels throughout the colon compatible with diarrheal disease. 3. Stable chronic left hydroureteronephrosis. 4. Nonobstructing right renal calculi. 5. Cholelithiasis. 6. Fatty infiltration of the liver. Electronically Signed   By: Darliss Cheney M.D.   On: 02/10/2023 17:15   (Echo, Carotid, EGD, Colonoscopy, ERCP)    Subjective: Pt denies any complaints    Discharge Exam: Vitals:   02/17/23 0420 02/17/23 0829  BP: (!) 98/51 (!) 111/53  Pulse: 78 85  Resp: 18 17  Temp: 98.7 F (37.1 C) 98.4 F (36.9 C)  SpO2: 99% 100%   Vitals:   02/16/23 1800 02/16/23 2043 02/17/23 0420 02/17/23 0829  BP: 110/62 107/63 (!) 98/51 (!) 111/53  Pulse: 80 72 78 85  Resp: 18 18 18 17   Temp: 98 F (36.7 C) 98.6 F (37 C) 98.7 F (37.1 C) 98.4 F (36.9 C)  TempSrc:  Oral Oral Oral  SpO2: 98%  99% 100%  Weight:      Height:        General: Pt is alert, awake, not in acute distress Cardiovascular:S1/S2 +, no rubs, no gallops Respiratory: CTA bilaterally, no wheezing, no rhonchi Abdominal: Soft, NT, ND, bowel sounds + Extremities:  no cyanosis    The results of significant diagnostics from this hospitalization (including imaging, microbiology, ancillary and laboratory) are listed below for reference.     Microbiology: Recent Results (from the past 240 hour(s))  C Difficile Quick Screen w PCR reflex     Status: None   Collection Time: 02/10/23  3:20 PM   Specimen: Stool  Result Value Ref Range Status   C Diff antigen NEGATIVE NEGATIVE Final   C Diff toxin NEGATIVE NEGATIVE Final   C Diff interpretation No C. difficile detected.  Final    Comment: Performed at [facility], [address] [place], [place] [address]  Gastrointestinal Panel by PCR , Stool     Status: Abnormal    Collection Time: 02/10/23  3:20 PM   Specimen: Stool  Result Value Ref Range Status   Campylobacter species DETECTED (A) NOT DETECTED Final    Comment: RESULT CALLED TO, READ BACK BY AND VERIFIED WITH:  RACHEL MERKLE 02/10/2023 2018 CP     Plesimonas shigelloides NOT DETECTED NOT DETECTED Final   Salmonella species NOT DETECTED NOT DETECTED Final   Yersinia enterocolitica NOT DETECTED NOT DETECTED Final   Vibrio species NOT DETECTED NOT DETECTED Final   Vibrio cholerae NOT DETECTED NOT DETECTED Final   Enteroaggregative E coli (EAEC) NOT DETECTED NOT DETECTED Final   Enteropathogenic E coli (EPEC) NOT DETECTED NOT DETECTED Final   Enterotoxigenic E coli (ETEC) NOT DETECTED NOT DETECTED Final   Shiga like toxin producing E coli (STEC)  NOT DETECTED NOT DETECTED Final   Shigella/Enteroinvasive E coli (EIEC) NOT DETECTED NOT DETECTED Final   Cryptosporidium NOT DETECTED NOT DETECTED Final   Cyclospora cayetanensis NOT DETECTED NOT DETECTED Final   Entamoeba histolytica NOT DETECTED NOT DETECTED Final   Giardia lamblia NOT DETECTED NOT DETECTED Final   Adenovirus F40/41 NOT DETECTED NOT DETECTED Final   Astrovirus NOT DETECTED NOT DETECTED Final   Norovirus GI/GII NOT DETECTED NOT DETECTED Final   Rotavirus A NOT DETECTED NOT DETECTED Final   Sapovirus (I, II, IV, and V) NOT DETECTED NOT DETECTED Final    Comment: Performed at [facility], [address] [place], [place] [address]  Blood Culture (routine x 2)     Status: None   Collection Time: 02/10/23  3:20 PM   Specimen: BLOOD  Result Value Ref Range Status   Specimen Description BLOOD BLOOD RIGHT ARM  Final   Special Requests   Final    BOTTLES DRAWN AEROBIC AND ANAEROBIC Blood Culture results may not be optimal due to an inadequate volume of blood received in culture bottles   Culture   Final    NO GROWTH 5 DAYS Performed at [facility], [address] [place], [place] [address]    Report Status  02/15/2023 FINAL  Final  Blood Culture (routine x 2)     Status: None   Collection Time: 02/10/23  3:38 PM   Specimen: BLOOD  Result Value Ref Range Status   Specimen Description BLOOD LEFT ANTECUBITAL  Final   Special Requests   Final    BOTTLES DRAWN AEROBIC AND ANAEROBIC Blood Culture adequate volume   Culture   Final    NO GROWTH 5 DAYS Performed at [facility], [address] [place], [place] [address]    Report Status 02/15/2023 FINAL  Final  Urine Culture (for pregnant, neutropenic or urologic patients or patients with an indwelling urinary catheter)     Status: Abnormal   Collection Time: 02/10/23  6:14 PM   Specimen: Urine, Clean Catch  Result Value Ref Range Status   Specimen Description   Final    URINE, CLEAN CATCH Performed at [facility], [address] [place], [place] [address]    Special Requests   Final    NONE Performed at [facility], [address] [place], [place] [address]    Culture (A)  Final    >=100,000 COLONIES/mL KLEBSIELLA OXYTOCA 30,000 COLONIES/mL CITROBACTER FREUNDII Confirmed Extended Spectrum Beta-Lactamase Producer (ESBL).  In bloodstream infections from ESBL organisms, carbapenems are preferred over piperacillin/tazobactam. They are shown to have a lower risk of mortality. FOR KLEBSIELLA OXYTOCA Performed at [facility], [address], [place], [place] [address]    Report Status 02/13/2023 FINAL  Final   Organism ID, Bacteria KLEBSIELLA OXYTOCA (A)  Final   Organism ID, Bacteria CITROBACTER FREUNDII (A)  Final      Susceptibility   Citrobacter freundii - MIC*    CEFEPIME <=0.12 SENSITIVE Sensitive     CEFTRIAXONE <=0.25 SENSITIVE Sensitive     CIPROFLOXACIN <=0.25 SENSITIVE Sensitive     GENTAMICIN <=1 SENSITIVE Sensitive     IMIPENEM <=0.25 SENSITIVE Sensitive     NITROFURANTOIN <=16 SENSITIVE Sensitive     TRIMETH/SULFA <=20 SENSITIVE Sensitive     PIP/TAZO <=4 SENSITIVE Sensitive     *  30,000 COLONIES/mL CITROBACTER FREUNDII   Klebsiella oxytoca - MIC*    AMPICILLIN >=32 RESISTANT Resistant     CEFEPIME >=  32 RESISTANT Resistant     CEFTRIAXONE >=64 RESISTANT Resistant     CIPROFLOXACIN 1 RESISTANT Resistant     GENTAMICIN >=16 RESISTANT Resistant     IMIPENEM 0.5 SENSITIVE Sensitive     NITROFURANTOIN 64 INTERMEDIATE Intermediate     TRIMETH/SULFA >=320 RESISTANT Resistant     AMPICILLIN/SULBACTAM >=32 RESISTANT Resistant     PIP/TAZO 8 SENSITIVE Sensitive     * >=100,000 COLONIES/mL KLEBSIELLA OXYTOCA     Labs: BNP (last 3 results) No results for input(s): "BNP" in the last 8760 hours. Basic Metabolic Panel: Recent Labs  Lab 02/12/23 0946 02/13/23 0502 02/14/23 0957 02/16/23 0533 02/17/23 0529  NA 137 136 139 137 137  K 3.5 3.1* 4.1 3.9 3.5  CL 107 108 111 107 109  CO2 24 21* 20* 21* 20*  GLUCOSE 111* 112* 129* 110* 108*  BUN 15 13 11 12 14   CREATININE 1.36* 1.43* 1.20* 1.22* 1.19*  CALCIUM 8.5* 8.2* 8.6* 8.6* 8.2*  MG  --   --  1.8  --   --    Liver Function Tests: Recent Labs  Lab 02/11/23 0825  AST 18  ALT 17  ALKPHOS 139*  BILITOT 0.5  PROT 6.0*  ALBUMIN 2.5*   No results for input(s): "LIPASE", "AMYLASE" in the last 168 hours.  No results for input(s): "AMMONIA" in the last 168 hours. CBC: Recent Labs  Lab 02/11/23 0825 02/12/23 0946 02/16/23 0533 02/17/23 0529  WBC 7.7 9.1 10.1 10.0  HGB 9.8* 10.5* 10.3* 9.9*  HCT 31.6* 34.9* 33.8* 31.9*  MCV 89.0 92.1 89.9 89.6  PLT 299 326 266 278   Cardiac Enzymes: No results for input(s): "CKTOTAL", "CKMB", "CKMBINDEX", "TROPONINI" in the last 168 hours. BNP: Invalid input(s): "POCBNP" CBG: No results for input(s): "GLUCAP" in the last 168 hours. D-Dimer No results for input(s): "DDIMER" in the last 72 hours. Hgb A1c No results for input(s): "HGBA1C" in the last 72 hours. Lipid Profile No results for input(s): "CHOL", "HDL", "LDLCALC", "TRIG", "CHOLHDL", "LDLDIRECT" in the last 72  hours. Thyroid function studies No results for input(s): "TSH", "T4TOTAL", "T3FREE", "THYROIDAB" in the last 72 hours.  Invalid input(s): "FREET3" Anemia work up No results for input(s): "VITAMINB12", "FOLATE", "FERRITIN", "TIBC", "IRON", "RETICCTPCT" in the last 72 hours. Urinalysis    Component Value Date/Time   COLORURINE YELLOW (A) 02/10/2023 1828   APPEARANCEUR TURBID (A) 02/10/2023 1828   APPEARANCEUR Cloudy (A) 10/21/2018 0841   LABSPEC 1.032 (H) 02/10/2023 1828   PHURINE 6.0 02/10/2023 1828   GLUCOSEU NEGATIVE 02/10/2023 1828   HGBUR SMALL (A) 02/10/2023 1828   BILIRUBINUR NEGATIVE 02/10/2023 1828   BILIRUBINUR negative 07/15/2020 0827   BILIRUBINUR Negative 10/21/2018 0841   KETONESUR NEGATIVE 02/10/2023 1828   PROTEINUR 30 (A) 02/10/2023 1828   UROBILINOGEN 0.2 07/15/2020 0827   NITRITE POSITIVE (A) 02/10/2023 1828   LEUKOCYTESUR LARGE (A) 02/10/2023 1828   Sepsis Labs Recent Labs  Lab 02/11/23 0825 02/12/23 0946 02/16/23 0533 02/17/23 0529  WBC 7.7 9.1 10.1 10.0   Microbiology Recent Results (from the past 240 hour(s))  C Difficile Quick Screen w PCR reflex     Status: None   Collection Time: 02/10/23  3:20 PM   Specimen: Stool  Result Value Ref Range Status   C Diff antigen NEGATIVE NEGATIVE Final   C Diff toxin NEGATIVE NEGATIVE Final   C Diff interpretation No C. difficile detected.  Final    Comment: Performed at [facility], [address] [place],  [place] [address]  Gastrointestinal Panel by PCR , Stool     Status: Abnormal   Collection Time: 02/10/23  3:20 PM   Specimen: Stool  Result Value Ref Range Status   Campylobacter species DETECTED (A) NOT DETECTED Final    Comment: RESULT CALLED TO, READ BACK BY AND VERIFIED WITH:  RACHEL MERKLE 02/10/2023 2018 CP     Plesimonas shigelloides NOT DETECTED NOT DETECTED Final   Salmonella species NOT DETECTED NOT DETECTED Final   Yersinia enterocolitica NOT DETECTED NOT DETECTED Final   Vibrio  species NOT DETECTED NOT DETECTED Final   Vibrio cholerae NOT DETECTED NOT DETECTED Final   Enteroaggregative E coli (EAEC) NOT DETECTED NOT DETECTED Final   Enteropathogenic E coli (EPEC) NOT DETECTED NOT DETECTED Final   Enterotoxigenic E coli (ETEC) NOT DETECTED NOT DETECTED Final   Shiga like toxin producing E coli (STEC) NOT DETECTED NOT DETECTED Final   Shigella/Enteroinvasive E coli (EIEC) NOT DETECTED NOT DETECTED Final   Cryptosporidium NOT DETECTED NOT DETECTED Final   Cyclospora cayetanensis NOT DETECTED NOT DETECTED Final   Entamoeba histolytica NOT DETECTED NOT DETECTED Final   Giardia lamblia NOT DETECTED NOT DETECTED Final   Adenovirus F40/41 NOT DETECTED NOT DETECTED Final   Astrovirus NOT DETECTED NOT DETECTED Final   Norovirus GI/GII NOT DETECTED NOT DETECTED Final   Rotavirus A NOT DETECTED NOT DETECTED Final   Sapovirus (I, II, IV, and V) NOT DETECTED NOT DETECTED Final    Comment: Performed at [facility], [address] [place], [place] [address]  Blood Culture (routine x 2)     Status: None   Collection Time: 02/10/23  3:20 PM   Specimen: BLOOD  Result Value Ref Range Status   Specimen Description BLOOD BLOOD RIGHT ARM  Final   Special Requests   Final    BOTTLES DRAWN AEROBIC AND ANAEROBIC Blood Culture results may not be optimal due to an inadequate volume of blood received in culture bottles   Culture   Final    NO GROWTH 5 DAYS Performed at [facility], [address] [place], [place] [address]    Report Status 02/15/2023 FINAL  Final  Blood Culture (routine x 2)     Status: None   Collection Time: 02/10/23  3:38 PM   Specimen: BLOOD  Result Value Ref Range Status   Specimen Description BLOOD LEFT ANTECUBITAL  Final   Special Requests   Final    BOTTLES DRAWN AEROBIC AND ANAEROBIC Blood Culture adequate volume   Culture   Final    NO GROWTH 5 DAYS Performed at [facility], [address] [place], [place] [address]     Report Status 02/15/2023 FINAL  Final  Urine Culture (for pregnant, neutropenic or urologic patients or patients with an indwelling urinary catheter)     Status: Abnormal   Collection Time: 02/10/23  6:14 PM   Specimen: Urine, Clean Catch  Result Value Ref Range Status   Specimen Description   Final    URINE, CLEAN CATCH Performed at [facility], [address], [place], [place] [address]    Special Requests   Final    NONE Performed at [facility], [address] [place], [place] [address]    Culture (A)  Final    >=100,000 COLONIES/mL KLEBSIELLA OXYTOCA 30,000 COLONIES/mL CITROBACTER FREUNDII Confirmed Extended Spectrum Beta-Lactamase Producer (ESBL).  In bloodstream infections from ESBL organisms, carbapenems are preferred over piperacillin/tazobactam. They are shown to have a lower  risk of mortality. FOR KLEBSIELLA OXYTOCA Performed at [facility], [address], [place], [place] [address]    Report Status 02/13/2023 FINAL  Final   Organism ID, Bacteria KLEBSIELLA OXYTOCA (A)  Final   Organism ID, Bacteria CITROBACTER FREUNDII (A)  Final      Susceptibility   Citrobacter freundii - MIC*    CEFEPIME <=0.12 SENSITIVE Sensitive     CEFTRIAXONE <=0.25 SENSITIVE Sensitive     CIPROFLOXACIN <=0.25 SENSITIVE Sensitive     GENTAMICIN <=1 SENSITIVE Sensitive     IMIPENEM <=0.25 SENSITIVE Sensitive     NITROFURANTOIN <=16 SENSITIVE Sensitive     TRIMETH/SULFA <=20 SENSITIVE Sensitive     PIP/TAZO <=4 SENSITIVE Sensitive     * 30,000 COLONIES/mL CITROBACTER FREUNDII   Klebsiella oxytoca - MIC*    AMPICILLIN >=32 RESISTANT Resistant     CEFEPIME >=32 RESISTANT Resistant     CEFTRIAXONE >=64 RESISTANT Resistant     CIPROFLOXACIN 1 RESISTANT Resistant     GENTAMICIN >=16 RESISTANT Resistant     IMIPENEM 0.5 SENSITIVE Sensitive     NITROFURANTOIN 64 INTERMEDIATE Intermediate     TRIMETH/SULFA >=320 RESISTANT Resistant     AMPICILLIN/SULBACTAM >=32  RESISTANT Resistant     PIP/TAZO 8 SENSITIVE Sensitive     * >=100,000 COLONIES/mL KLEBSIELLA OXYTOCA     Time coordinating discharge: Over 30 minutes  SIGNED:   Charise Killian, MD  [facility] 02/17/2023, 11:41 AM Pager   If 7PM-7AM, please contact night-coverage [email or web address]

## 2023-02-18 ENCOUNTER — Telehealth: Payer: Self-pay | Admitting: *Deleted

## 2023-02-18 ENCOUNTER — Telehealth: Payer: Self-pay

## 2023-02-18 DIAGNOSIS — A045 Campylobacter enteritis: Secondary | ICD-10-CM

## 2023-02-18 NOTE — Consult Note (Signed)
[facility] Customer service manager [facility]) Accountable Care Organization (ACO) [facility] Liaison Note  02/18/2023  [name] April 09, 1951 [id number]  Location: [facility] RN Hospital Liaison screened the patient remotely at [facility].  Insurance: Jeanne [name] is a 72 y.o. female who is a Primary Care Patient of Jerl Mina, MD. The patient was screened for 30 day readmission hospitalization with noted extreme risk score for unplanned readmission risk with 3IP/1ED in 6 months.  The patient was assessed for potential [facility]) Care Management service needs for post hospital transition for care coordination. Review of patient's electronic medical record reveals patient was admitted for Colitis due to Campylobacter species. Made a referral for [facility] RN care coordinator for hospital prevention readmission and ongoing management of care.   [facility] Care Management/Population Health does not replace or interfere with any arrangements made by the Inpatient Transition of Care team.   For questions contact:   Elliot Cousin, RN, BSN [facility] Liaison [facility]   [facility]  Population Health Office Hours MTWF  8:00 am-6:00 pm Off on Thursday [phone] mobile [phone] [Office toll free line][facility] Office Hours are M-F 8:30 - 5 pm 24 hour nurse advise line [phone] Concierge  [email or web address]

## 2023-02-18 NOTE — Progress Notes (Signed)
  Care Coordination   Note   02/18/2023 Name: Jeanne [name] MRN: [id number] DOB: [birth year]  Jeanne [name] is a 72 y.o. year old female who sees Jerl Mina, MD for primary care. I reached out to Claudette Stapler by phone today to offer care coordination services.  [name] was given information about Care Coordination services today including:   The Care Coordination services include support from the care team which includes your Nurse Coordinator, Clinical Social Worker, or Pharmacist.  The Care Coordination team is here to help remove barriers to the health concerns and goals most important to you. Care Coordination services are voluntary, and the patient may decline or stop services at any time by request to their care team member.   Care Coordination Consent Status: Patient agreed to services and verbal consent obtained.   Follow up plan:  Telephone appointment with care coordination team member scheduled for:  03/04/2023   Encounter Outcome:  Pt. Scheduled from referral   Burman Nieves, [employer] Care Coordination Care Guide Direct Dial: [phone]

## 2023-02-19 DIAGNOSIS — I5031 Acute diastolic (congestive) heart failure: Secondary | ICD-10-CM | POA: Diagnosis not present

## 2023-02-19 DIAGNOSIS — N1832 Chronic kidney disease, stage 3b: Secondary | ICD-10-CM | POA: Diagnosis not present

## 2023-02-19 DIAGNOSIS — D631 Anemia in chronic kidney disease: Secondary | ICD-10-CM | POA: Diagnosis not present

## 2023-02-19 DIAGNOSIS — A0472 Enterocolitis due to Clostridium difficile, not specified as recurrent: Secondary | ICD-10-CM | POA: Diagnosis not present

## 2023-02-19 DIAGNOSIS — N132 Hydronephrosis with renal and ureteral calculous obstruction: Secondary | ICD-10-CM | POA: Diagnosis not present

## 2023-02-19 DIAGNOSIS — I959 Hypotension, unspecified: Secondary | ICD-10-CM | POA: Diagnosis not present

## 2023-02-19 DIAGNOSIS — I2510 Atherosclerotic heart disease of native coronary artery without angina pectoris: Secondary | ICD-10-CM | POA: Diagnosis not present

## 2023-02-19 DIAGNOSIS — U071 COVID-19: Secondary | ICD-10-CM | POA: Diagnosis not present

## 2023-02-19 DIAGNOSIS — N179 Acute kidney failure, unspecified: Secondary | ICD-10-CM | POA: Diagnosis not present

## 2023-02-19 DIAGNOSIS — I130 Hypertensive heart and chronic kidney disease with heart failure and stage 1 through stage 4 chronic kidney disease, or unspecified chronic kidney disease: Secondary | ICD-10-CM | POA: Diagnosis not present

## 2023-02-20 DIAGNOSIS — Z96649 Presence of unspecified artificial hip joint: Secondary | ICD-10-CM | POA: Diagnosis not present

## 2023-02-26 DIAGNOSIS — Z8739 Personal history of other diseases of the musculoskeletal system and connective tissue: Secondary | ICD-10-CM | POA: Diagnosis not present

## 2023-02-26 DIAGNOSIS — M19071 Primary osteoarthritis, right ankle and foot: Secondary | ICD-10-CM | POA: Diagnosis not present

## 2023-02-26 DIAGNOSIS — G894 Chronic pain syndrome: Secondary | ICD-10-CM | POA: Diagnosis not present

## 2023-02-27 ENCOUNTER — Other Ambulatory Visit: Payer: Self-pay

## 2023-02-27 ENCOUNTER — Emergency Department: Payer: Medicare HMO

## 2023-02-27 ENCOUNTER — Encounter: Payer: Self-pay | Admitting: Radiology

## 2023-02-27 ENCOUNTER — Inpatient Hospital Stay
Admission: EM | Admit: 2023-02-27 | Discharge: 2023-03-02 | DRG: 392 | Disposition: A | Discharge status: Home Health | Payer: Medicare HMO | Attending: Obstetrics and Gynecology | Admitting: Obstetrics and Gynecology

## 2023-02-27 DIAGNOSIS — Z8541 Personal history of malignant neoplasm of cervix uteri: Secondary | ICD-10-CM

## 2023-02-27 DIAGNOSIS — G8929 Other chronic pain: Secondary | ICD-10-CM | POA: Diagnosis present

## 2023-02-27 DIAGNOSIS — M0800 Unspecified juvenile rheumatoid arthritis of unspecified site: Secondary | ICD-10-CM | POA: Diagnosis present

## 2023-02-27 DIAGNOSIS — N183 Chronic kidney disease, stage 3 unspecified: Secondary | ICD-10-CM | POA: Diagnosis not present

## 2023-02-27 DIAGNOSIS — Y831 Surgical operation with implant of artificial internal device as the cause of abnormal reaction of the patient, or of later complication, without mention of misadventure at the time of the procedure: Secondary | ICD-10-CM | POA: Diagnosis present

## 2023-02-27 DIAGNOSIS — R5381 Other malaise: Secondary | ICD-10-CM | POA: Diagnosis present

## 2023-02-27 DIAGNOSIS — R112 Nausea with vomiting, unspecified: Secondary | ICD-10-CM

## 2023-02-27 DIAGNOSIS — R531 Weakness: Secondary | ICD-10-CM | POA: Diagnosis not present

## 2023-02-27 DIAGNOSIS — R300 Dysuria: Secondary | ICD-10-CM | POA: Diagnosis present

## 2023-02-27 DIAGNOSIS — Z743 Need for continuous supervision: Secondary | ICD-10-CM | POA: Diagnosis not present

## 2023-02-27 DIAGNOSIS — N1832 Chronic kidney disease, stage 3b: Secondary | ICD-10-CM | POA: Diagnosis present

## 2023-02-27 DIAGNOSIS — N134 Hydroureter: Secondary | ICD-10-CM | POA: Diagnosis not present

## 2023-02-27 DIAGNOSIS — Z880 Allergy status to penicillin: Secondary | ICD-10-CM

## 2023-02-27 DIAGNOSIS — Z87440 Personal history of urinary (tract) infections: Secondary | ICD-10-CM

## 2023-02-27 DIAGNOSIS — Z87891 Personal history of nicotine dependence: Secondary | ICD-10-CM

## 2023-02-27 DIAGNOSIS — Z8739 Personal history of other diseases of the musculoskeletal system and connective tissue: Secondary | ICD-10-CM

## 2023-02-27 DIAGNOSIS — Z8619 Personal history of other infectious and parasitic diseases: Secondary | ICD-10-CM

## 2023-02-27 DIAGNOSIS — R1111 Vomiting without nausea: Secondary | ICD-10-CM | POA: Diagnosis not present

## 2023-02-27 DIAGNOSIS — E860 Dehydration: Secondary | ICD-10-CM | POA: Diagnosis present

## 2023-02-27 DIAGNOSIS — Z8616 Personal history of COVID-19: Secondary | ICD-10-CM

## 2023-02-27 DIAGNOSIS — N1831 Chronic kidney disease, stage 3a: Secondary | ICD-10-CM | POA: Diagnosis present

## 2023-02-27 DIAGNOSIS — R197 Diarrhea, unspecified: Principal | ICD-10-CM

## 2023-02-27 DIAGNOSIS — I700 Atherosclerosis of aorta: Secondary | ICD-10-CM | POA: Diagnosis not present

## 2023-02-27 DIAGNOSIS — R8271 Bacteriuria: Secondary | ICD-10-CM | POA: Diagnosis present

## 2023-02-27 DIAGNOSIS — Z96651 Presence of right artificial knee joint: Secondary | ICD-10-CM | POA: Diagnosis present

## 2023-02-27 DIAGNOSIS — T8451XA Infection and inflammatory reaction due to internal right hip prosthesis, initial encounter: Secondary | ICD-10-CM | POA: Diagnosis present

## 2023-02-27 DIAGNOSIS — Z885 Allergy status to narcotic agent status: Secondary | ICD-10-CM

## 2023-02-27 DIAGNOSIS — R109 Unspecified abdominal pain: Secondary | ICD-10-CM | POA: Diagnosis not present

## 2023-02-27 DIAGNOSIS — R1084 Generalized abdominal pain: Principal | ICD-10-CM

## 2023-02-27 DIAGNOSIS — I10 Essential (primary) hypertension: Secondary | ICD-10-CM | POA: Diagnosis not present

## 2023-02-27 DIAGNOSIS — Z96641 Presence of right artificial hip joint: Secondary | ICD-10-CM | POA: Diagnosis not present

## 2023-02-27 DIAGNOSIS — Z87442 Personal history of urinary calculi: Secondary | ICD-10-CM

## 2023-02-27 DIAGNOSIS — Z96642 Presence of left artificial hip joint: Secondary | ICD-10-CM | POA: Diagnosis present

## 2023-02-27 DIAGNOSIS — Z90710 Acquired absence of both cervix and uterus: Secondary | ICD-10-CM

## 2023-02-27 DIAGNOSIS — I959 Hypotension, unspecified: Secondary | ICD-10-CM | POA: Diagnosis not present

## 2023-02-27 DIAGNOSIS — I129 Hypertensive chronic kidney disease with stage 1 through stage 4 chronic kidney disease, or unspecified chronic kidney disease: Secondary | ICD-10-CM | POA: Diagnosis present

## 2023-02-27 DIAGNOSIS — Z7401 Bed confinement status: Secondary | ICD-10-CM

## 2023-02-27 DIAGNOSIS — M25451 Effusion, right hip: Secondary | ICD-10-CM | POA: Diagnosis not present

## 2023-02-27 DIAGNOSIS — Z8249 Family history of ischemic heart disease and other diseases of the circulatory system: Secondary | ICD-10-CM

## 2023-02-27 DIAGNOSIS — Z79899 Other long term (current) drug therapy: Secondary | ICD-10-CM

## 2023-02-27 DIAGNOSIS — Z803 Family history of malignant neoplasm of breast: Secondary | ICD-10-CM

## 2023-02-27 LAB — GASTROINTESTINAL PANEL BY PCR, STOOL (REPLACES STOOL CULTURE)

## 2023-02-27 LAB — COMPREHENSIVE METABOLIC PANEL
ALT: 40 U/L (ref 0–44)
AST: 26 U/L (ref 15–41)
Albumin: 2.4 g/dL — ABNORMAL LOW (ref 3.5–5.0)
Alkaline Phosphatase: 276 U/L — ABNORMAL HIGH (ref 38–126)
Anion gap: 9 (ref 5–15)
BUN: 27 mg/dL — ABNORMAL HIGH (ref 8–23)
CO2: 19 mmol/L — ABNORMAL LOW (ref 22–32)
Calcium: 8.1 mg/dL — ABNORMAL LOW (ref 8.9–10.3)
Chloride: 113 mmol/L — ABNORMAL HIGH (ref 98–111)
Creatinine, Ser: 1.15 mg/dL — ABNORMAL HIGH (ref 0.44–1.00)
GFR, Estimated: 51 mL/min — ABNORMAL LOW (ref >60)
Glucose, Bld: 116 mg/dL — ABNORMAL HIGH (ref 70–99)
Potassium: 3.8 mmol/L (ref 3.5–5.1)
Sodium: 141 mmol/L (ref 135–145)
Total Bilirubin: 0.5 mg/dL (ref 0.3–1.2)
Total Protein: 6.1 g/dL — ABNORMAL LOW (ref 6.5–8.1)

## 2023-02-27 LAB — URINALYSIS, ROUTINE W REFLEX MICROSCOPIC
Bilirubin Urine: NEGATIVE
Glucose, UA: NEGATIVE mg/dL
Hgb urine dipstick: NEGATIVE
Ketones, ur: NEGATIVE mg/dL
Nitrite: POSITIVE — AB
Protein, ur: 100 mg/dL — AB
Specific Gravity, Urine: 1.032 — ABNORMAL HIGH (ref 1.005–1.030)
Squamous Epithelial / HPF: NONE SEEN /HPF (ref 0–5)
WBC, UA: >50 WBC/hpf (ref 0–5)
pH: 6 (ref 5.0–8.0)

## 2023-02-27 LAB — TROPONIN I (HIGH SENSITIVITY)
Troponin I (High Sensitivity): 8 ng/L (ref <18)
Troponin I (High Sensitivity): 6 ng/L (ref <18)

## 2023-02-27 LAB — CBC WITH DIFFERENTIAL/PLATELET
Abs Immature Granulocytes: 0.07 10*3/uL (ref 0.00–0.07)
Basophils Absolute: 0 10*3/uL (ref 0.0–0.1)
Basophils Relative: 0 %
Eosinophils Absolute: 0 10*3/uL (ref 0.0–0.5)
Eosinophils Relative: 0 %
HCT: 34.8 % — ABNORMAL LOW (ref 36.0–46.0)
Hemoglobin: 10.9 g/dL — ABNORMAL LOW (ref 12.0–15.0)
Immature Granulocytes: 1 %
Lymphocytes Relative: 3 %
Lymphs Abs: 0.5 10*3/uL — ABNORMAL LOW (ref 0.7–4.0)
MCH: 28.5 pg (ref 26.0–34.0)
MCHC: 31.3 g/dL (ref 30.0–36.0)
MCV: 91.1 fL (ref 80.0–100.0)
Monocytes Absolute: 0.5 10*3/uL (ref 0.1–1.0)
Monocytes Relative: 3 %
Neutro Abs: 13.7 10*3/uL — ABNORMAL HIGH (ref 1.7–7.7)
Neutrophils Relative %: 93 %
Platelets: 441 10*3/uL — ABNORMAL HIGH (ref 150–400)
RBC: 3.82 MIL/uL — ABNORMAL LOW (ref 3.87–5.11)
RDW: 16.4 % — ABNORMAL HIGH (ref 11.5–15.5)
WBC: 14.7 10*3/uL — ABNORMAL HIGH (ref 4.0–10.5)
nRBC: 0 % (ref 0.0–0.2)

## 2023-02-27 LAB — C DIFFICILE QUICK SCREEN W PCR REFLEX
C Diff antigen: NEGATIVE
C Diff interpretation: NOT DETECTED
C Diff toxin: NEGATIVE

## 2023-02-27 LAB — LIPASE, BLOOD: Lipase: 24 U/L (ref 11–51)

## 2023-02-27 MED ORDER — PIPERACILLIN-TAZOBACTAM 3.375 G IVPB 30 MIN
3.3750 g | Freq: Once | INTRAVENOUS | Status: AC
  Administered 2023-02-27: 3.375 g via INTRAVENOUS
  Filled 2023-02-27: qty 50

## 2023-02-27 MED ORDER — SODIUM CHLORIDE 0.9 % IV BOLUS
1000.0000 mL | Freq: Once | INTRAVENOUS | Status: AC
  Administered 2023-02-27: 1000 mL via INTRAVENOUS

## 2023-02-27 MED ORDER — ONDANSETRON HCL 4 MG/2ML IJ SOLN
4.0000 mg | Freq: Once | INTRAMUSCULAR | Status: AC
  Administered 2023-02-27: 4 mg via INTRAVENOUS
  Filled 2023-02-27: qty 2

## 2023-02-27 MED ORDER — IOHEXOL 300 MG/ML  SOLN
100.0000 mL | Freq: Once | INTRAMUSCULAR | Status: AC | PRN
  Administered 2023-02-27: 100 mL via INTRAVENOUS

## 2023-02-27 MED ORDER — ONDANSETRON HCL 4 MG/2ML IJ SOLN
4.0000 mg | Freq: Four times a day (QID) | INTRAMUSCULAR | Status: DC | PRN
  Administered 2023-02-27: 4 mg via INTRAVENOUS
  Filled 2023-02-27: qty 2

## 2023-02-27 MED ORDER — ENOXAPARIN SODIUM 40 MG/0.4ML IJ SOSY
40.0000 mg | PREFILLED_SYRINGE | INTRAMUSCULAR | Status: DC
  Administered 2023-02-27 – 2023-03-01 (×3): 40 mg via SUBCUTANEOUS
  Filled 2023-02-27 (×3): qty 0.4

## 2023-02-27 MED ORDER — HYDROMORPHONE HCL 1 MG/ML IJ SOLN
0.5000 mg | INTRAMUSCULAR | Status: DC | PRN
  Administered 2023-02-27 – 2023-03-01 (×5): 0.5 mg via INTRAVENOUS
  Filled 2023-02-27 (×5): qty 0.5

## 2023-02-27 MED ORDER — ONDANSETRON HCL 4 MG PO TABS: 4.0000 mg | ORAL_TABLET | Freq: Four times a day (QID) | ORAL | Status: DC | PRN

## 2023-02-27 MED ORDER — AZITHROMYCIN 250 MG PO TABS
500.0000 mg | ORAL_TABLET | Freq: Every day | ORAL | Status: AC
  Administered 2023-02-27 – 2023-03-01 (×3): 500 mg via ORAL
  Filled 2023-02-27 (×3): qty 2

## 2023-02-27 MED ORDER — FENTANYL CITRATE PF 50 MCG/ML IJ SOSY
50.0000 ug | PREFILLED_SYRINGE | Freq: Once | INTRAMUSCULAR | Status: AC
  Administered 2023-02-27: 50 ug via INTRAVENOUS
  Filled 2023-02-27: qty 1

## 2023-02-27 NOTE — ED Notes (Signed)
Patient was cleaned up at this time.

## 2023-02-27 NOTE — ED Provider Notes (Addendum)
Sign out received, pending meaning workup including urinalysis, CT abdomen pelvis.  In short, nausea vomiting diarrhea with recent C. difficile colitis treated.  CT scan unremarkable, urinalysis indicative of UTI, leukocytosis 14.  Mild AKI.  Admitted to hospitalist service.  Hospitalist reviewed CT scan which showed fluid collection along the prior surgical site in the right hip, no pain in that area, however given history of hip replacements complicated by infections managed at [facility] orthopedics, requested reach out to [facility] orthopedics for possible transfer.  I reassessed the patient at this time, has some chronic pain unchanged to the area and I am able to range it without eliciting significant pain.  No obvious overlying infectious changes.  Spoke with Dr. Carolynne Edouard of [facility] orth[facility] and reviewed the case, does not think hospital hospital transfer at this time is warranted, has reached out to their joint specialist and is awaiting further direction but thought likely to need outpatient follow-up after her acute conditions are remedied at this time.  I reconsulted our hospitalist for admission here.   --- Reconstructive surgery specialist at [facility] has called me back and agree with the plan as above, if asymptomatic to the hip with other sources causing her acute symptoms, defer on further interventions from an orthopedic standpoint at this time but encouraged to reconsult them if she develops any pain or discomfort or symptoms associated with her hip.   Pilar Jarvis, MD 02/27/23 1411    Pilar Jarvis, MD 02/27/23 [phone]

## 2023-02-27 NOTE — Assessment & Plan Note (Addendum)
Noted recurrent diarrhea with recent multiple admissions for C. difficile colitis as well as Campylobacter associated diarrhea Recurrent diarrhea for 1 to 2 days today White count 15 CT negative for any acute colitis still with noted diarrhea Case preliminarily discussed with Dr. Norma Fredrickson who recommended further evaluation Follow up formal recommendations

## 2023-02-27 NOTE — H&P (Signed)
History and Physical    Patient: Jeanne [name] KGM:[id number] DOB: 1950-10-17 DOA: 02/27/2023 DOS: the patient was seen and examined on 02/27/2023 PCP: Jerl Mina, MD  Patient coming from: Home  Chief Complaint:  Chief Complaint  Patient presents with   Emesis    Recently discharged from 7 days stay at [facility] for Colitis. States she has been having chills, N/V with abdominal pain mulitple times today. 10+. Hx of Cdiff   Diarrhea   HPI: Jeanne [name] is a 72 y.o. female with medical history significant of juvenile rheumatoid arthritis, hypertension, CKD, cervical cancer, C. Difficile who presented for recurrent diarrhea.  Patient noted to have been admitted June 2 through June 9 for recurrent diarrhea with noted diagnosis of Campylobacter status post course of azithromycin as well as completed course of C. difficile colitis.  Patient noted with multiple admissions for similar issues over the past 2 to 3 months.  Worsening diarrhea over the past 24 hours.  No fevers or chills.  Mild nausea.  Diarrhea nonbloody nonbilious.  Minimal abdominal pain.  Worsening generalized malaise.  Patient also with noted prior hip replacements as well as recent evaluation within the [facility] system for right hip infection.  Minimal right hip pain.  No reported trauma. Presented to the ER afebrile, blood pressures 90s to 110s over 50s to 60s.  White count 15, hemoglobin 11, urinalysis indicative of infection-though no dysuria, creatinine 1.15.  CT abdomen pelvis negative for colitis but does show some diarrhea also with right hip 9 x 2.2 cm seroma with?  Infection.  Review of Systems: As mentioned in the history of present illness. All other systems reviewed and are negative. Past Medical History:  Diagnosis Date   Bedbound    since R hip surgery, cannot bear weight on R leg. since 07/02/22. Has not walked since then   Cervical cancer (HCC)    CKD stage 3b, GFR 30-44 ml/min (HCC)    Complication of anesthesia     History of kidney stones    Hypertension    PONV (postoperative nausea and vomiting)    rheumatoidArthritis    RA   Past Surgical History:  Procedure Laterality Date   ABDOMINAL HYSTERECTOMY  09/10/2003   CYSTOGRAM N/A 08/04/2018   Procedure: CYSTOGRAM WITH URETHAL DILATION;  Surgeon: Vanna Scotland, MD;  Location: [facility] ORS;  Service: Urology;  Laterality: N/A;   CYSTOSCOPY W/ RETROGRADES Bilateral 08/04/2018   Procedure: CYSTOSCOPY WITH RETROGRADE PYELOGRAM;  Surgeon: Vanna Scotland, MD;  Location: [facility] ORS;  Service: Urology;  Laterality: Bilateral;   CYSTOSCOPY/URETEROSCOPY/HOLMIUM LASER/STENT PLACEMENT Left 08/04/2018   Procedure: CYSTOSCOPY/URETEROSCOPY/HOLMIUM LASER/STENT PLACEMENT;  Surgeon: Vanna Scotland, MD;  Location: [facility] ORS;  Service: Urology;  Laterality: Left;   JOINT REPLACEMENT     TOTAL HIP ARTHROPLASTY Right 1972   TOTAL HIP ARTHROPLASTY Left 1981   TOTAL KNEE ARTHROPLASTY Right 1990   Social History:  reports that she quit smoking about 33 years ago. Her smoking use included cigarettes. She has never used smokeless tobacco. She reports that she does not drink alcohol and does not use drugs.  Allergies  Allergen Reactions   Codeine Nausea Only and Nausea And Vomiting   Augmentin [Amoxicillin-Pot Clavulanate] Nausea Only    Very nauseated/ feels sore on body     Family History  Problem Relation Age of Onset   Breast cancer Mother    Hypertension Father    Cancer Father    Prostate cancer Neg Hx    Kidney cancer Neg  Hx    Bladder Cancer Neg Hx     Prior to Admission medications   Medication Sig Start Date End Date Taking? Authorizing Provider  cyanocobalamin 1000 MCG tablet Take 1 tablet (1,000 mcg total) by mouth daily. 01/20/23 04/20/23 Yes Gillis Santa, MD  diclofenac (VOLTAREN) 75 MG EC tablet Take 75 mg by mouth 2 (two) times daily.   Yes [provider]  gabapentin (NEURONTIN) 100 MG capsule Take 100 mg by mouth at bedtime. 10/16/22  10/16/23 Yes [provider]  lidocaine-prilocaine (EMLA) cream APPLY SPARINGLY 2-3 TIMES A DAY TO AREA OF RADIATION THERAPY IRRITATION. 05/11/20  Yes Bacigalupo, Marzella Schlein, MD  Multiple Vitamin (MULTIVITAMIN WITH MINERALS) TABS tablet Take 1 tablet by mouth daily. 01/20/23 04/20/23 Yes Gillis Santa, MD  oxyCODONE (OXY IR/ROXICODONE) 5 MG immediate release tablet Take 1 tablet (5 mg total) by mouth every 6 (six) hours as needed. 01/19/23  Yes Gillis Santa, MD  promethazine (PHENERGAN) 25 MG tablet Take 0.5 tablets (12.5 mg total) by mouth every 6 (six) hours as needed for up to 14 days for nausea or vomiting. 02/17/23 03/03/23 Yes Charise Killian, MD  saccharomyces boulardii (FLORASTOR) 250 MG capsule Take 1 capsule (250 mg total) by mouth 2 (two) times daily for 28 days. 02/17/23 03/17/23 Yes Charise Killian, MD  tretinoin (RETIN-A) 0.025 % cream Apply topically at bedtime.   Yes [provider]  Vitamin D, Ergocalciferol, (DRISDOL) 1.25 MG (50000 UNIT) CAPS capsule Take 1 capsule (50,000 Units total) by mouth every 7 (seven) days. 01/21/23 04/21/23 Yes Gillis Santa, MD    Physical Exam: Vitals:   02/27/23 1000 02/27/23 1330 02/27/23 1504 02/27/23 1506  BP: 112/66 110/60 129/62   Pulse: 85 88 85   Resp: 16 12 16    Temp:   98 F (36.7 C)   TempSrc:   Oral   SpO2: 96% 100% 100%   Weight:    65.8 kg  Height:       Constitutional:      Appearance: She is normal weight.  HENT:     Head: Normocephalic and atraumatic.     Nose: Nose normal.     Mouth/Throat:     Mouth: Mucous membranes are moist.  Eyes:     Pupils: Pupils are equal, round, and reactive to light.  Cardiovascular:     Rate and Rhythm: Normal rate and regular rhythm.  Pulmonary:     Effort: Pulmonary effort is normal.  Abdominal:     General: Abdomen is flat. Bowel sounds are normal.  Musculoskeletal:        General: Normal range of motion.     Cervical back: Normal range of motion.     Comments: No  signficant R hip tenderness    Skin:    General: Skin is warm.  Neurological:     General: No focal deficit present.  Psychiatric:        Mood and Affect: Mood normal.  Data Reviewed:  There are no new results to review at this time. CT ABDOMEN PELVIS W CONTRAST CLINICAL DATA:  Abdominal pain  EXAM: CT ABDOMEN AND PELVIS WITH CONTRAST  TECHNIQUE: Multidetector CT imaging of the abdomen and pelvis was performed using the standard protocol following bolus administration of intravenous contrast.  RADIATION DOSE REDUCTION: This exam was performed according to the departmental dose-optimization program which includes automated exposure control, adjustment of the mA and/or kV according to patient size and/or use of iterative reconstruction technique.  CONTRAST:  OMNIPAQUE IOHEXOL 300 MG/ML  SOLN  COMPARISON:  12/27/2022  FINDINGS: Lower chest: No acute abnormality.  Hepatobiliary: No focal liver abnormality is seen. Cholelithiasis. No biliary ductal dilatation.  Pancreas: Unremarkable. No pancreatic ductal dilatation or surrounding inflammatory changes.  Spleen: Normal in size without focal abnormality.  Adrenals/Urinary Tract: Adrenal glands are unremarkable. Multiple right parapelvic cysts. No right obstructive uropathy. Severe left renal atrophy. Moderate chronic left hydroureteronephrosis. Normal bladder.  Stomach/Bowel: Stomach is within normal limits. No evidence of bowel wall thickening, distention, or inflammatory changes. Small amount of fluid throughout the colon as can be seen with diarrhea.  Vascular/Lymphatic: Aortic atherosclerosis. No enlarged abdominal or pelvic lymph nodes.  Reproductive: Uterus and bilateral adnexa are unremarkable.  Other: No abdominal wall hernia or abnormality. No abdominopelvic ascites. Mild anasarca.  Musculoskeletal: No acute osseous abnormality. No aggressive osseous lesion. Generalized osteopenia. Chronic T12, L1  and L5 vertebral bodies. Bilateral total hip arthroplasties with susceptibility artifact resulting from the orthopedic hardware partially obscuring the adjacent soft tissue and osseous structures. Ankylosis of bilateral SI joints. Fluid collection in the subcutaneous fat overlying the right hip along the track of the surgical site measuring 8.9 x 2.2 cm likely reflecting a postoperative seroma. Secondarily infected postoperative seroma cannot be excluded.  IMPRESSION: 1. No acute abdominal or pelvic pathology. 2. Small amount of fluid throughout the colon as can be seen with diarrhea. 3. Cholelithiasis. 4. Severe left renal atrophy. Moderate chronic left hydroureteronephrosis. 5. Fluid collection in the subcutaneous fat overlying the right hip along the track of the surgical site measuring 8.9 x 2.2 cm likely reflecting a postoperative seroma. Secondarily infected postoperative seroma cannot be excluded. 6.  Aortic Atherosclerosis (ICD10-I70.0).  Electronically Signed   By: Elige Ko M.D.   On: 02/27/2023 08:31  Lab Results  Component Value Date   WBC 14.7 (H) 02/27/2023   HGB 10.9 (L) 02/27/2023   HCT 34.8 (L) 02/27/2023   MCV 91.1 02/27/2023   PLT 441 (H) 02/27/2023   Last metabolic panel Lab Results  Component Value Date   GLUCOSE 116 (H) 02/27/2023   NA 141 02/27/2023   K 3.8 02/27/2023   CL 113 (H) 02/27/2023   CO2 19 (L) 02/27/2023   BUN 27 (H) 02/27/2023   CREATININE 1.15 (H) 02/27/2023   GFRNONAA 51 (L) 02/27/2023   CALCIUM 8.1 (L) 02/27/2023   PHOS 3.6 01/17/2023   PROT 6.1 (L) 02/27/2023   ALBUMIN 2.4 (L) 02/27/2023   LABGLOB 2.4 10/02/2019   AGRATIO 1.6 10/02/2019   BILITOT 0.5 02/27/2023   ALKPHOS 276 (H) 02/27/2023   AST 26 02/27/2023   ALT 40 02/27/2023   ANIONGAP 9 02/27/2023    Assessment and Plan: * Diarrhea Noted recurrent diarrhea with recent multiple admissions for C. difficile colitis as well as Campylobacter associated  diarrhea Recurrent diarrhea for 1 to 2 days today White count 15 CT negative for any acute colitis still with noted diarrhea Case preliminarily discussed with Dr. 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  Followed by Jerilynn Mages  White count 15 today Case preliminarily discussed with Dr. Joice Lofts who recommended transfer to [facility] hospital for further evaluation Per Dr. Modesto Charon, case discussed w/ orthopedics at [facility] who deferred admission w/ recommendations including potential IR drainage of seroma and further recommendations w/ ortho team at [facility].  Follow up formal orthopedic recommendations         Advance Care Planning:   Code Status: Full Code   Consults: Orthopedics and Gastroenterology   Family Communication: Family at the bedside   Severity of Illness: The appropriate patient status for this patient is INPATIENT. Inpatient status is judged to be reasonable and necessary in order to provide the required intensity of service to ensure the patient's safety. The patient's presenting symptoms, physical exam findings, and initial radiographic and laboratory data in the context of their chronic comorbidities is felt to place them at high risk for further clinical deterioration. Furthermore, it is not anticipated that the patient will be medically stable for  discharge from the hospital within 2 midnights of admission.   * I certify that at the point of admission it is my clinical judgment that the patient will require inpatient hospital care spanning beyond 2 midnights from the point of admission due to high intensity of service, high risk for further deterioration and high frequency of surveillance required.*  Author: Floydene Flock, MD 02/27/2023 5:39 PM  For on call review [email or web address].

## 2023-02-27 NOTE — Consult Note (Signed)
[facility] GI Inpatient Consult Note   Jamey Reas, M.D.  Reason for Consult: Acute diarrhea   Attending Requesting Consult: Doree Albee, M.D.  Outpatient Primary Physician: Jerl Mina, M.D.  History of Present Illness: Jeanne [name] is a 72 y.o. female with a history of cervical cancer, juvenile rheumatoid arthritis, hypertension, CKD, C. difficile who was hospitalized from June 5 February 17, 2023 for Campylobacter colitis.  She was previously hospitalized for C. difficile colitis. Patient says she woke this morning with greater than 10 liquidy stools without blood.  She denies any abdominal pain but has mild nausea without vomiting.  She has been placed on no new medications and no new antibiotics.  She has a record of remote colonoscopy in 2007 by Dr. Letitia Libra formally of [facility].  She had severe angulations in the colon secondary to adhesions although the exam was completed and was noted only to have diverticulosis. Patient denies any history of dysphagia, anorexia, hematemesis, melena or hematochezia.  Initial stool evaluation including GI panel and C. difficile quick screen were negative.  GI service is asked to consult for further recommendations.  Past Medical History:  Past Medical History:  Diagnosis Date   Bedbound    since R hip surgery, cannot bear weight on R leg. since 07/02/22. 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) 02/27/2023   Elevated alkaline phosphatase level 02/11/2023   SIRS (systemic inflammatory response syndrome) (HCC) 02/11/2023   Diarrhea of presumed infectious origin  02/11/2023   Colitis due to Campylobacter species 02/10/2023   Asymptomatic bacteriuria 02/10/2023   Difficulty walking 02/10/2023   Hypomagnesemia 01/08/2023   Hypokalemia 01/08/2023   CKD stage 3b, GFR 30-44 ml/min (HCC) 01/03/2023   Hypotension 01/02/2023   Pancolitis (HCC) 12/29/2022   History of Clostridioides difficile colitis 12/29/2022   Shock circulatory (HCC) 12/28/2022   Sepsis (HCC) 11/10/2022   Nausea vomiting and diarrhea 11/09/2022   Clostridium difficile diarrhea 11/09/2022   COVID-19 virus infection 11/09/2022   AKI (acute kidney injury) (HCC) 11/09/2022   Anemia 11/09/2022   Fungal infection of skin 09/23/2019   UTI (urinary tract infection) 07/15/2017   Dysuria 01/28/2017   Chronic radiation cystitis 02/28/2015   Coitalgia 02/28/2015   Blood pressure elevated 02/28/2015   Calculus of kidney 02/28/2015   Arthritis or polyarthritis, rheumatoid (HCC) 02/28/2015   Basal cell papilloma 02/28/2015   H/O cataract extraction 09/22/2014   Combined form of senile cataract 07/07/2014   NS (nuclear sclerosis) 05/21/2014   History of repair of hip joint 03/16/2013   H/O total knee replacement 03/16/2013   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    Past Surgical History: Past Surgical History:  Procedure Laterality Date   ABDOMINAL HYSTERECTOMY  09/10/2003   CYSTOGRAM N/A 08/04/2018   Procedure: CYSTOGRAM WITH URETHAL DILATION;  Surgeon: Vanna Scotland, MD;  Location: [facility] ORS;  Service: Urology;  Laterality: N/A;   CYSTOSCOPY W/ RETROGRADES Bilateral 08/04/2018   Procedure: CYSTOSCOPY WITH RETROGRADE PYELOGRAM;  Surgeon: Vanna Scotland, MD;  Location: [facility] ORS;  Service: Urology;  Laterality: Bilateral;   CYSTOSCOPY/URETEROSCOPY/HOLMIUM LASER/STENT PLACEMENT Left 08/04/2018   Procedure: CYSTOSCOPY/URETEROSCOPY/HOLMIUM LASER/STENT PLACEMENT;  Surgeon: Vanna Scotland, MD;  Location: [facility] ORS;  Service: Urology;  Laterality: Left;   JOINT REPLACEMENT      TOTAL HIP ARTHROPLASTY Right 1972   TOTAL HIP ARTHROPLASTY Left 1981   TOTAL KNEE ARTHROPLASTY Right 1990    Allergies: Allergies  Allergen Reactions   Codeine Nausea Only and Nausea And Vomiting   Augmentin [Amoxicillin-Pot Clavulanate] Nausea Only    Very nauseated/ feels sore on body     Home Medications: (Not in a hospital admission)  Home medication reconciliation was completed with the patient.   Scheduled Inpatient Medications:    Continuous Inpatient Infusions:    PRN Inpatient Medications:  HYDROmorphone (DILAUDID) injection, ondansetron (ZOFRAN) IV  Family History: family history includes Breast cancer in her mother; Cancer in her father; Hypertension in her father.   GI Family History: Negative for cancer, inflammatory bowel disease.  Social History:   reports that she quit smoking about 33 years ago. Her smoking use included cigarettes. She has never used smokeless tobacco. She reports that she does not drink alcohol and does not use drugs. The patient denies ETOH, tobacco, or drug use.    Review of Systems: Review of Systems - Negative except HPI  Physical Examination: BP 110/60   Pulse 88   Temp 98.6 F (37 C) (Oral)   Resp 12   Ht 5\' 4"  (1.626 m)   Wt 58.1 kg   SpO2 100%   BMI 21.97 kg/m  Physical Exam Constitutional:      General: She is not in acute distress.    Appearance: She is ill-appearing. She is not diaphoretic.  HENT:     Head: Normocephalic and atraumatic.     Nose: Nose normal.     Mouth/Throat:     Mouth: Mucous membranes are dry.  Eyes:     General: No scleral icterus.    Conjunctiva/sclera: Conjunctivae normal.     Pupils: Pupils are equal, round, and reactive to light.  Cardiovascular:     Rate and Rhythm: Normal rate.     Pulses: Normal pulses.  Pulmonary:     Effort: Pulmonary effort is normal. No respiratory distress.     Breath sounds: No stridor.  Abdominal:     General: Abdomen is flat. There is no distension.      Palpations: Abdomen is soft. There is no mass.     Tenderness: There is no abdominal tenderness. There is no guarding.  Musculoskeletal:        General: Swelling present.     Cervical back: Normal range of motion.     Right lower leg: Edema present.  Skin:    General: Skin is warm.     Capillary Refill: Capillary refill takes less than 2 seconds.  Neurological:     General: No focal deficit present.     Mental Status: She is alert.  Psychiatric:        Mood and Affect: Mood normal.        Thought Content: Thought content normal.        Judgment: Judgment normal.     Data: Lab Results  Component Value Date   WBC 14.7 (H) 02/27/2023   HGB 10.9 (L) 02/27/2023   HCT 34.8 (L) 02/27/2023   MCV 91.1 02/27/2023   PLT 441 (H) 02/27/2023   Recent Labs  Lab 02/27/23 0528  HGB 10.9*   Lab Results  Component  Value Date   NA 141 02/27/2023   K 3.8 02/27/2023   CL 113 (H) 02/27/2023   CO2 19 (L) 02/27/2023   BUN 27 (H) 02/27/2023   CREATININE 1.15 (H) 02/27/2023   Lab Results  Component Value Date   ALT 40 02/27/2023   AST 26 02/27/2023   ALKPHOS 276 (H) 02/27/2023   BILITOT 0.5 02/27/2023   No results for input(s): "APTT", "INR", "PTT" in the last 168 hours.    Latest Ref Rng & Units 02/27/2023    5:28 AM 02/17/2023    5:29 AM 02/16/2023    5:33 AM  CBC  WBC 4.0 - 10.5 K/uL 14.7  10.0  10.1   Hemoglobin 12.0 - 15.0 g/dL 08.6  9.9  57.8   Hematocrit 36.0 - 46.0 % 34.8  31.9  33.8   Platelets 150 - 400 K/uL 441  278  266     STUDIES: CT ABDOMEN PELVIS W CONTRAST  Result Date: 02/27/2023 CLINICAL DATA:  Abdominal pain EXAM: CT ABDOMEN AND PELVIS WITH CONTRAST TECHNIQUE: Multidetector CT imaging of the abdomen and pelvis was performed using the standard protocol following bolus administration of intravenous contrast. RADIATION DOSE REDUCTION: This exam was performed according to the departmental dose-optimization program which includes automated exposure control, adjustment  of the mA and/or kV according to patient size and/or use of iterative reconstruction technique. CONTRAST:  OMNIPAQUE IOHEXOL 300 MG/ML  SOLN COMPARISON:  12/27/2022 FINDINGS: Lower chest: No acute abnormality. Hepatobiliary: No focal liver abnormality is seen. Cholelithiasis. No biliary ductal dilatation. Pancreas: Unremarkable. No pancreatic ductal dilatation or surrounding inflammatory changes. Spleen: Normal in size without focal abnormality. Adrenals/Urinary Tract: Adrenal glands are unremarkable. Multiple right parapelvic cysts. No right obstructive uropathy. Severe left renal atrophy. Moderate chronic left hydroureteronephrosis. Normal bladder. Stomach/Bowel: Stomach is within normal limits. No evidence of bowel wall thickening, distention, or inflammatory changes. Small amount of fluid throughout the colon as can be seen with diarrhea. Vascular/Lymphatic: Aortic atherosclerosis. No enlarged abdominal or pelvic lymph nodes. Reproductive: Uterus and bilateral adnexa are unremarkable. Other: No abdominal wall hernia or abnormality. No abdominopelvic ascites. Mild anasarca. Musculoskeletal: No acute osseous abnormality. No aggressive osseous lesion. Generalized osteopenia. Chronic T12, L1 and L5 vertebral bodies. Bilateral total hip arthroplasties with susceptibility artifact resulting from the orthopedic hardware partially obscuring the adjacent soft tissue and osseous structures. Ankylosis of bilateral SI joints. Fluid collection in the subcutaneous fat overlying the right hip along the track of the surgical site measuring 8.9 x 2.2 cm likely reflecting a postoperative seroma. Secondarily infected postoperative seroma cannot be excluded. IMPRESSION: 1. No acute abdominal or pelvic pathology. 2. Small amount of fluid throughout the colon as can be seen with diarrhea. 3. Cholelithiasis. 4. Severe left renal atrophy. Moderate chronic left hydroureteronephrosis. 5. Fluid collection in the subcutaneous fat  overlying the right hip along the track of the surgical site measuring 8.9 x 2.2 cm likely reflecting a postoperative seroma. Secondarily infected postoperative seroma cannot be excluded. 6.  Aortic Atherosclerosis (ICD10-I70.0). Electronically Signed   By: Elige Ko M.D.   On: 02/27/2023 08:31   @IMAGES @  Assessment:  Acute diarrhea - Ddx includes acute infectious colitis, medication side effect, viral gastroenteritis, recurrent Clostridioides diarrhea, Campylobacter, despite negative initial stool tests. Seroma of Right hip. Initially thought prudent to transfer to her surgeon at [facility], they have denied the transfer. Patient to be seen by Dr. Vivianne Master, on call orthopedist. Generalized abdominal pain - Listed as primary diagnosis at admission,  though patient denies abdominal pain during my evaluation.Would not be unexpected, however, given recurrent colitis symptoms. CT showed no acute inflammatory process. Nausea/vomiting. History of Cervical cancer. Dehydration secondary to #1 and #4.   Recommendations:  Supportive care with IV fluids, electrolyte management. After review, agree with empiric azithromycin given advanced age and comorbidities, increased wbc. Check ova and parasites, stool culture. Will follow along.  Thank you for the consult. Please call with questions or concerns.  Rosina Lowenstein, "Mellody Dance MD [facility] [address] [place], [place] [address] [phone]  02/27/2023 2:41 PM

## 2023-02-27 NOTE — ED Notes (Signed)
Patient was cleaned up at this time. 

## 2023-02-27 NOTE — Assessment & Plan Note (Signed)
UA Nitrite and LE + w/o symptoms  Monitor for now

## 2023-02-27 NOTE — Consult Note (Signed)
ORTHOPAEDIC CONSULTATION  REQUESTING PHYSICIAN: Floydene Flock, MD  Chief Complaint:   Status post surgical treatment for infected right total hip arthroplasty.  History of Present Illness: Jeanne [name] is a 72 y.o. female with multiple medical problems including hypertension, juvenile rheumatoid arthritis, and cervical cancer who first underwent a right total hip arthroplasty in 1972 for degenerative arthritis resulting from her rheumatoid arthritis.  This implant apparently became infected, so she underwent a resection arthroplasty and static spacer placement (04/09/2022), followed by reimplantation (07/02/2022) with intra-operative cultures growing out Staph epi, Strep anginosus, and E.faecalis.  The patient has been nonambulatory since her October surgery as she has been unable to bear weight on her right leg since then.  She has been admitted at least 4 times over the past 6 months for diarrhea and subsequent dehydration.  Fecal cultures have grown out C. difficile and Campylobacter.  The patient most recently had been discharged on 02/17/2023 after a hospitalization for diarrhea.  The patient notes that her stools have again have become "soft" over the past 24 hours or so, prompting her to present to the emergency room.  Subsequently, the patient has been admitted for further evaluation and treatment by GI.  An abdominal pelvic CT scan was performed as part of her GI workup.  An approximately 9 x 2 cm fluid collection was noted in the posterior lateral subcutaneous tissues of her right hip, raising concern for a possible abscess, given her recent history of infection.  Therefore, orthopedic consultation has been requested.  The patient denies any pain in the hip and does not recall any recent injury to the hip..  She did have an episode of chills prior to coming into the emergency room yesterday afternoon which she attributed to her  bladder infection.  Past Medical History:  Diagnosis Date   Bedbound    since R hip surgery, cannot bear weight on R leg. since 07/02/22. Has not walked since then   Cervical cancer (HCC)    CKD stage 3b, GFR 30-44 ml/min (HCC)    Complication of anesthesia    History of kidney stones    Hypertension    PONV (postoperative nausea and vomiting)    rheumatoidArthritis    RA   Past Surgical History:  Procedure Laterality Date   ABDOMINAL HYSTERECTOMY  09/10/2003   CYSTOGRAM N/A 08/04/2018   Procedure: CYSTOGRAM WITH URETHAL DILATION;  Surgeon: Vanna Scotland, MD;  Location: [facility] ORS;  Service: Urology;  Laterality: N/A;   CYSTOSCOPY W/ RETROGRADES Bilateral 08/04/2018   Procedure: CYSTOSCOPY WITH RETROGRADE PYELOGRAM;  Surgeon: Vanna Scotland, MD;  Location: [facility] ORS;  Service: Urology;  Laterality: Bilateral;   CYSTOSCOPY/URETEROSCOPY/HOLMIUM LASER/STENT PLACEMENT Left 08/04/2018   Procedure: CYSTOSCOPY/URETEROSCOPY/HOLMIUM LASER/STENT PLACEMENT;  Surgeon: Vanna Scotland, MD;  Location: [facility] ORS;  Service: Urology;  Laterality: Left;   JOINT REPLACEMENT     TOTAL HIP ARTHROPLASTY Right 1972   TOTAL HIP ARTHROPLASTY Left 1981   TOTAL KNEE ARTHROPLASTY Right 1990   Social History   Socioeconomic History   Marital status: Married    Spouse name: kim   Number of children: 0   Years of education: Not on file   Highest education level: Some college, no degree  Occupational History   Occupation: disability    Comment: now on [employer]  Tobacco Use   Smoking status: Former    Years: 20    Types: Cigarettes    Quit date: 09/09/1989    Years since quitting: 33.4   Smokeless  tobacco: Never   Tobacco comments:    QUIT IN 1990  Vaping Use   Vaping Use: Never used  Substance and Sexual Activity   Alcohol use: No    Alcohol/week: 0.0 standard drinks of alcohol   Drug use: No   Sexual activity: Not Currently    Birth control/protection: None  Other Topics Concern   Not on file   Social History Narrative   Has 1 adopted daughter    Social Determinants of Health   Financial Resource Strain: Low Risk  (08/10/2020)   Overall Financial Resource Strain (CARDIA)    Difficulty of Paying Living Expenses: Not hard at all  Food Insecurity: No Food Insecurity (02/16/2023)   Hunger Vital Sign    Worried About Running Out of Food in the Last Year: Never true    Ran Out of Food in the Last Year: Never true  Transportation Needs: No Transportation Needs (02/16/2023)   PRAPARE - Administrator, Civil Service (Medical): No    Lack of Transportation (Non-Medical): No  Physical Activity: Inactive (08/10/2020)   Exercise Vital Sign    Days of Exercise per Week: 0 days    Minutes of Exercise per Session: 0 min  Stress: No Stress Concern Present (08/10/2020)   [employer] of Occupational Health - Occupational Stress Questionnaire    Feeling of Stress : Only a little  Social Connections: Moderately Integrated (08/10/2020)   Social Connection and Isolation Panel [NHANES]    Frequency of Communication with Friends and Family: More than three times a week    Frequency of Social Gatherings with Friends and Family: More than three times a week    Attends Religious Services: More than 4 times per year    Active Member of [employer] or Organizations: No    Attends Engineer, structural: Never    Marital Status: Married   Family History  Problem Relation Age of Onset   Breast cancer Mother    Hypertension Father    Cancer Father    Prostate cancer Neg Hx    Kidney cancer Neg Hx    Bladder Cancer Neg Hx    Allergies  Allergen Reactions   Codeine Nausea Only and Nausea And Vomiting   Augmentin [Amoxicillin-Pot Clavulanate] Nausea Only    Very nauseated/ feels sore on body    Prior to Admission medications   Medication Sig Start Date End Date Taking? Authorizing Provider  cyanocobalamin 1000 MCG tablet Take 1 tablet (1,000 mcg total) by mouth daily. 01/20/23  04/20/23 Yes Gillis Santa, MD  diclofenac (VOLTAREN) 75 MG EC tablet Take 75 mg by mouth 2 (two) times daily.   Yes [provider]  gabapentin (NEURONTIN) 100 MG capsule Take 100 mg by mouth at bedtime. 10/16/22 10/16/23 Yes [provider]  lidocaine-prilocaine (EMLA) cream APPLY SPARINGLY 2-3 TIMES A DAY TO AREA OF RADIATION THERAPY IRRITATION. 05/11/20  Yes Bacigalupo, Marzella Schlein, MD  Multiple Vitamin (MULTIVITAMIN WITH MINERALS) TABS tablet Take 1 tablet by mouth daily. 01/20/23 04/20/23 Yes Gillis Santa, MD  oxyCODONE (OXY IR/ROXICODONE) 5 MG immediate release tablet Take 1 tablet (5 mg total) by mouth every 6 (six) hours as needed. 01/19/23  Yes Gillis Santa, MD  promethazine (PHENERGAN) 25 MG tablet Take 0.5 tablets (12.5 mg total) by mouth every 6 (six) hours as needed for up to 14 days for nausea or vomiting. 02/17/23 03/03/23 Yes Charise Killian, MD  saccharomyces boulardii (FLORASTOR) 250 MG capsule Take 1 capsule (250 mg  total) by mouth 2 (two) times daily for 28 days. 02/17/23 03/17/23 Yes Charise Killian, MD  tretinoin (RETIN-A) 0.025 % cream Apply topically at bedtime.   Yes [provider]  Vitamin D, Ergocalciferol, (DRISDOL) 1.25 MG (50000 UNIT) CAPS capsule Take 1 capsule (50,000 Units total) by mouth every 7 (seven) days. 01/21/23 04/21/23 Yes Gillis Santa, MD   CT ABDOMEN PELVIS W CONTRAST  Result Date: 02/27/2023 CLINICAL DATA:  Abdominal pain EXAM: CT ABDOMEN AND PELVIS WITH CONTRAST TECHNIQUE: Multidetector CT imaging of the abdomen and pelvis was performed using the standard protocol following bolus administration of intravenous contrast. RADIATION DOSE REDUCTION: This exam was performed according to the departmental dose-optimization program which includes automated exposure control, adjustment of the mA and/or kV according to patient size and/or use of iterative reconstruction technique. CONTRAST:  OMNIPAQUE IOHEXOL 300 MG/ML  SOLN COMPARISON:   12/27/2022 FINDINGS: Lower chest: No acute abnormality. Hepatobiliary: No focal liver abnormality is seen. Cholelithiasis. No biliary ductal dilatation. Pancreas: Unremarkable. No pancreatic ductal dilatation or surrounding inflammatory changes. Spleen: Normal in size without focal abnormality. Adrenals/Urinary Tract: Adrenal glands are unremarkable. Multiple right parapelvic cysts. No right obstructive uropathy. Severe left renal atrophy. Moderate chronic left hydroureteronephrosis. Normal bladder. Stomach/Bowel: Stomach is within normal limits. No evidence of bowel wall thickening, distention, or inflammatory changes. Small amount of fluid throughout the colon as can be seen with diarrhea. Vascular/Lymphatic: Aortic atherosclerosis. No enlarged abdominal or pelvic lymph nodes. Reproductive: Uterus and bilateral adnexa are unremarkable. Other: No abdominal wall hernia or abnormality. No abdominopelvic ascites. Mild anasarca. Musculoskeletal: No acute osseous abnormality. No aggressive osseous lesion. Generalized osteopenia. Chronic T12, L1 and L5 vertebral bodies. Bilateral total hip arthroplasties with susceptibility artifact resulting from the orthopedic hardware partially obscuring the adjacent soft tissue and osseous structures. Ankylosis of bilateral SI joints. Fluid collection in the subcutaneous fat overlying the right hip along the track of the surgical site measuring 8.9 x 2.2 cm likely reflecting a postoperative seroma. Secondarily infected postoperative seroma cannot be excluded. IMPRESSION: 1. No acute abdominal or pelvic pathology. 2. Small amount of fluid throughout the colon as can be seen with diarrhea. 3. Cholelithiasis. 4. Severe left renal atrophy. Moderate chronic left hydroureteronephrosis. 5. Fluid collection in the subcutaneous fat overlying the right hip along the track of the surgical site measuring 8.9 x 2.2 cm likely reflecting a postoperative seroma. Secondarily infected postoperative  seroma cannot be excluded. 6.  Aortic Atherosclerosis (ICD10-I70.0). Electronically Signed   By: Elige Ko M.D.   On: 02/27/2023 08:31    Positive ROS: All other systems have been reviewed and were otherwise negative with the exception of those mentioned in the HPI and as above.  Physical Exam: General:  Alert, no acute distress Psychiatric:  Patient is competent for consent with normal mood and affect   Cardiovascular:  No pedal edema Respiratory:  No wheezing, non-labored breathing GI:  Abdomen is soft and non-tender Skin:  No lesions in the area of chief complaint Neurologic:  Sensation intact distally Lymphatic:  No axillary or cervical lymphadenopathy  Orthopedic Exam:  Orthopedic examination is limited to the right hip and lower extremity.  Skin inspection around the right hip is notable for a well-healed surgical incision.  There is no drainage emanating from the incision, but mild swelling, warmth, and erythema is noted in the area of the incision.  She has no tenderness to palpation over the anterior or lateral aspects of the hip, nor does she have any pain  with any passive or attempted active motion of the hip.  She is able to dorsiflex and plantarflex her toes.  Sensation is intact to light touch to all distributions.  She has good capillary refill to her right foot.  X-rays:  A recent CT scan of the abdomen and pelvis is available for review and has been reviewed by myself.  The findings are as described above.  According to the radiologist's report, the fluid collection is most consistent with a postoperative seroma, but cannot exclude a secondarily infected seroma.  Assessment: Status post several surgical procedures for a septic right total hip arthroplasty now with a fluid collection in the subcutaneous tissues adjacent to the right total hip arthroplasty identified on CT scan.  Plan: The treatment options have been discussed with the patient.  At this point, it is unclear  as to whether this finding has any bearing on her recurrent diarrhea and elevated white count with a left shift.  She also appears to have findings concerning for urinary tract infection which may also explain her lab work.  Regardless, given her history of a recent infection in her right hip as well as her physical examination findings of some erythema and swelling in the hip area, I feel it is imperative that additional/recurrent infection to the hip area be ruled out.  Therefore, I think it would be reasonable and appropriate to ask Interventional Radiology to perform an aspiration of the fluid collection under either CT or ultrasound guidance.  This fluid should be sent off for culture and sensitivity, Gram stain, and cell count and differential.  If this indeed does come back showing evidence consistent with persistent/recurrent infection, then she will need to be transferred back to her orthopedic surgeon at [facility] for definitive management of this issue.  Thank you for asking me to participate in the care of this most unfortunate woman.  I will be happy to follow her with you.   Maryagnes Amos, MD  Beeper #:  [phone]  02/27/2023 3:12 PM

## 2023-02-27 NOTE — Assessment & Plan Note (Addendum)
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  Followed by Jerilynn Mages  White count 15 today Case preliminarily discussed with Dr. Joice Lofts who recommended transfer to [facility] hospital for further evaluation Per Dr. Modesto Charon, case discussed w/ orthopedics at [facility] who deferred admission w/ recommendations including potential IR drainage of seroma and further recommendations w/ ortho team at [facility].  Follow up formal orthopedic recommendations

## 2023-02-27 NOTE — Assessment & Plan Note (Signed)
Cr 1.15  Appears near baseline  Follow

## 2023-02-27 NOTE — ED Provider Notes (Signed)
[facility] Provider Note    Event Date/Time   First MD Initiated Contact with Patient 02/27/23 [phone]     (approximate)   History   Emesis (Recently discharged from 7 days stay at [facility] for Colitis. States she has been having chills, N/V with abdominal pain mulitple times today. 10+. Hx of Cdiff)   HPI  Jeanne [name] is a 72 y.o. female brought to the ED via EMS from home with a chief complaint of chills, nausea/vomiting, abdominal pain.  Patient hospitalized from 6/2 - 02/17/2023 for sepsis due to colitis.  Prior to that patient was hospitalized for C. difficile with pancolitis and COVID.  Finished oral vancomycin and had negative C. difficile on most recent hospitalization.  She has been essentially bedbound since hip surgery in October.  States she has been doing fine until a few hours prior to calling EMS.  No diarrhea, describes stool as "soft poops".  Endorses nausea/vomiting and generalized abdominal discomfort.  Denies fever/chills, chest pain, shortness of breath, dysuria.     Past Medical History   Past Medical History:  Diagnosis Date   Bedbound    since R hip surgery, cannot bear weight on R leg. since 07/02/22. Has not walked since then   Cervical cancer (HCC)    CKD stage 3b, GFR 30-44 ml/min (HCC)    Complication of anesthesia    History of kidney stones    Hypertension    PONV (postoperative nausea and vomiting)    rheumatoidArthritis    RA     Active Problem List   Patient Active Problem List   Diagnosis Date Noted   Elevated alkaline phosphatase level 02/11/2023   SIRS (systemic inflammatory response syndrome) (HCC) 02/11/2023   Diarrhea of presumed infectious origin 02/11/2023   Colitis due to Campylobacter species 02/10/2023   Asymptomatic bacteriuria 02/10/2023   Difficulty walking 02/10/2023   Hypomagnesemia 01/08/2023   Hypokalemia 01/08/2023   CKD stage 3b, GFR 30-44 ml/min (HCC) 01/03/2023   Hypotension 01/02/2023    Pancolitis (HCC) 12/29/2022   History of Clostridioides difficile colitis 12/29/2022   Shock circulatory (HCC) 12/28/2022   Sepsis (HCC) 11/10/2022   Nausea vomiting and diarrhea 11/09/2022   Clostridium difficile diarrhea 11/09/2022   COVID-19 virus infection 11/09/2022   AKI (acute kidney injury) (HCC) 11/09/2022   Anemia 11/09/2022   Fungal infection of skin 09/23/2019   UTI (urinary tract infection) 07/15/2017   Dysuria 01/28/2017   Chronic radiation cystitis 02/28/2015   Coitalgia 02/28/2015   Blood pressure elevated 02/28/2015   Calculus of kidney 02/28/2015   Arthritis or polyarthritis, rheumatoid (HCC) 02/28/2015   Basal cell papilloma 02/28/2015   H/O cataract extraction 09/22/2014   Combined form of senile cataract 07/07/2014   NS (nuclear sclerosis) 05/21/2014   History of repair of hip joint 03/16/2013   H/O total knee replacement 03/16/2013   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     Past Surgical History   Past Surgical History:  Procedure Laterality Date   ABDOMINAL HYSTERECTOMY  09/10/2003   CYSTOGRAM N/A 08/04/2018   Procedure: CYSTOGRAM WITH URETHAL DILATION;  Surgeon: Vanna Scotland, MD;  Location: [facility] ORS;  Service: Urology;  Laterality: N/A;   CYSTOSCOPY W/ RETROGRADES Bilateral 08/04/2018   Procedure: CYSTOSCOPY WITH RETROGRADE PYELOGRAM;  Surgeon: Vanna Scotland, MD;  Location: [facility] ORS;  Service: Urology;  Laterality: Bilateral;   CYSTOSCOPY/URETEROSCOPY/HOLMIUM LASER/STENT PLACEMENT Left 08/04/2018   Procedure: CYSTOSCOPY/URETEROSCOPY/HOLMIUM LASER/STENT PLACEMENT;  Surgeon: Vanna Scotland, MD;  Location: [facility] ORS;  Service: Urology;  Laterality: Left;   JOINT REPLACEMENT     TOTAL HIP ARTHROPLASTY Right 1972   TOTAL HIP ARTHROPLASTY Left 1981   TOTAL KNEE ARTHROPLASTY Right 1990     Home Medications   Prior to Admission medications   Medication Sig Start Date End Date Taking? Authorizing Provider  cyanocobalamin 1000 MCG  tablet Take 1 tablet (1,000 mcg total) by mouth daily. 01/20/23 04/20/23  Gillis Santa, MD  diclofenac (VOLTAREN) 75 MG EC tablet Take 75 mg by mouth 2 (two) times daily.    [provider]  gabapentin (NEURONTIN) 100 MG capsule Take 100 mg by mouth at bedtime. 10/16/22 10/16/23  [provider]  lidocaine-prilocaine (EMLA) cream APPLY SPARINGLY 2-3 TIMES A DAY TO AREA OF RADIATION THERAPY IRRITATION. 05/11/20   Bacigalupo, Marzella Schlein, MD  Multiple Vitamin (MULTIVITAMIN WITH MINERALS) TABS tablet Take 1 tablet by mouth daily. 01/20/23 04/20/23  Gillis Santa, MD  oxyCODONE (OXY IR/ROXICODONE) 5 MG immediate release tablet Take 1 tablet (5 mg total) by mouth every 6 (six) hours as needed. 01/19/23   Gillis Santa, MD  promethazine (PHENERGAN) 25 MG tablet Take 0.5 tablets (12.5 mg total) by mouth every 6 (six) hours as needed for up to 14 days for nausea or vomiting. 02/17/23 03/03/23  Charise Killian, MD  saccharomyces boulardii (FLORASTOR) 250 MG capsule Take 1 capsule (250 mg total) by mouth 2 (two) times daily for 28 days. 02/17/23 03/17/23  Charise Killian, MD  tretinoin (RETIN-A) 0.025 % cream Apply topically at bedtime.    [provider]  Vitamin D, Ergocalciferol, (DRISDOL) 1.25 MG (50000 UNIT) CAPS capsule Take 1 capsule (50,000 Units total) by mouth every 7 (seven) days. 01/21/23 04/21/23  Gillis Santa, MD     Allergies  Codeine and Augmentin [amoxicillin-pot clavulanate]   Family History   Family History  Problem Relation Age of Onset   Breast cancer Mother    Hypertension Father    Cancer Father    Prostate cancer Neg Hx    Kidney cancer Neg Hx    Bladder Cancer Neg Hx      Physical Exam  Triage Vital Signs: ED Triage Vitals  Enc Vitals Group     BP 02/27/23 0520 (!) 118/49     Pulse Rate 02/27/23 0520 91     Resp 02/27/23 0520 18     Temp 02/27/23 0520 98.9 F (37.2 C)     Temp Source 02/27/23 0520 Oral     SpO2 02/27/23 0520 100 %     Weight --       Height --      Head Circumference --      Peak Flow --      Pain Score 02/27/23 0519 8     Pain Loc --      Pain Edu? --      Excl. in GC? --     Updated Vital Signs: BP (!) 98/58   Pulse 97   Temp 98.9 F (37.2 C) (Oral)   Resp 18   SpO2 95%    General: Awake, mild distress.  CV:  RRR.  Good peripheral perfusion.  Resp:  Normal effort.  CTAB. Abd:  Mild diffuse tenderness to palpation without rebound or guarding.  No distention.  Other:  No truncal vesicles.   ED Results / Procedures / Treatments  Labs (all labs ordered are listed, but only abnormal results are displayed) Labs Reviewed  CBC WITH DIFFERENTIAL/PLATELET - Abnormal; Notable for  the following components:      Result Value   WBC 14.7 (*)    RBC 3.82 (*)    Hemoglobin 10.9 (*)    HCT 34.8 (*)    RDW 16.4 (*)    Platelets 441 (*)    Neutro Abs 13.7 (*)    Lymphs Abs 0.5 (*)    All other components within normal limits  COMPREHENSIVE METABOLIC PANEL - Abnormal; Notable for the following components:   Chloride 113 (*)    CO2 19 (*)    Glucose, Bld 116 (*)    BUN 27 (*)    Creatinine, Ser 1.15 (*)    Calcium 8.1 (*)    Total Protein 6.1 (*)    Albumin 2.4 (*)    Alkaline Phosphatase 276 (*)    GFR, Estimated 51 (*)    All other components within normal limits  LIPASE, BLOOD  URINALYSIS, ROUTINE W REFLEX MICROSCOPIC  TROPONIN I (HIGH SENSITIVITY)     EKG  ED ECG REPORT I, Lalana Wachter J, the attending physician, personally viewed and interpreted this ECG.   Date: 02/27/2023  EKG Time: 0610  Rate: 96  Rhythm: normal sinus rhythm  Axis: RAD  Intervals:none  ST&T Change: Nonspecific    RADIOLOGY  CT pending   Official radiology report(s): No results found.   PROCEDURES:  Critical Care performed: No  .1-3 Lead EKG Interpretation  Performed by: Irean Hong, MD Authorized by: Irean Hong, MD     Interpretation: normal     ECG rate:  90   ECG rate assessment: normal      Rhythm: sinus rhythm     Ectopy: none     Conduction: normal   Comments:     Patient placed on cardiac monitor to evaluate for arrhythmias    MEDICATIONS ORDERED IN ED: Medications  sodium chloride 0.9 % bolus 1,000 mL (1,000 mLs Intravenous New Bag/Given 02/27/23 0553)  ondansetron (ZOFRAN) injection 4 mg (4 mg Intravenous Given 02/27/23 0553)  fentaNYL (SUBLIMAZE) injection 50 mcg (50 mcg Intravenous Given 02/27/23 0553)     IMPRESSION / MDM / ASSESSMENT AND PLAN / ED COURSE  I reviewed the triage vital signs and the nursing notes.                             72 year old female presenting with abdominal pain, nausea/vomiting. Differential diagnosis includes, but is not limited to, ovarian cyst, ovarian torsion, acute appendicitis, diverticulitis, urinary tract infection/pyelonephritis, endometriosis, bowel obstruction, colitis, renal colic, gastroenteritis, hernia, etc. I have personally reviewed patient's records and note her recent hospitalization from 6/2 - 02/17/2023.  Patient's presentation is most consistent with acute presentation with potential threat to life or bodily function.  The patient is on the cardiac monitor to evaluate for evidence of arrhythmia and/or significant heart rate changes.  Will obtain lab work, UA, CT abdomen/pelvis.  Initiate IV fluid resuscitation, IV fentanyl for pain.  With IV Zofran for nausea.  Keep NPO.  Will reassess.  Clinical Course as of 02/27/23 0641  Wed Feb 27, 2023  0981 Patient is now having diarrhea.  Will send stool specimen.  White count with moderate leukocytosis at 14.7, mild AKI which is stable from prior.  Awaiting UA and CT scan.  Care will be transferred to the oncoming provider at change of shift. [JS]    Clinical Course User Index [JS] Irean Hong, MD     FINAL CLINICAL  IMPRESSION(S) / ED DIAGNOSES   Final diagnoses:  Generalized abdominal pain  Nausea and vomiting, unspecified vomiting type  Diarrhea, unspecified type      Rx / DC Orders   ED Discharge Orders     None        Note:  This document was prepared using Dragon voice recognition software and may include unintentional dictation errors.   Irean Hong, MD 02/27/23 [phone]

## 2023-02-27 NOTE — Consult Note (Addendum)
Initial Consultation Note   Patient: Jeanne [name] ZOX:[id number] DOB: 1951-07-18 PCP: Jerl Mina, MD DOA: 02/27/2023 DOS: the patient was seen and examined on 02/27/2023 Primary service: Floydene Flock, MD  Referring physician: Leodis Sias MD  Reason for consult: Recurrent Diarrhea   Assessment/Plan: Assessment and Plan: * Diarrhea Noted recurrent diarrhea with recent multiple admissions for C. difficile colitis as well as Campylobacter associated diarrhea Recurrent diarrhea for 1 to 2 days today White count 15 CT negative for any acute colitis still with noted diarrhea Case preliminarily discussed with Dr. Norma Fredrickson who recommended further evaluation Will defer for now given active seroma with?  Of infection in the right hip pending transfer to [facility].  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  Followed by Jerilynn Mages  White count 15 today Case preliminarily discussed with Dr. Joice Lofts who recommended transfer to [facility] hospital for further evaluation        TRH will sign off at present, please call us again when needed.  HPI: Jeanne [name] is a 72 y.o. female with past medical history of juvenile rheumatoid arthritis, hypertension, CKD, cervical cancer, C. Difficile who presented for recurrent diarrhea.  Patient noted to have been admitted June 2 through June 9 for recurrent diarrhea with noted diagnosis of Campylobacter status post course of azithromycin as well as completed course of C. difficile colitis.  Patient noted with multiple admissions for similar issues over the  past 2 to 3 months.  Worsening diarrhea over the past 24 hours.  No fevers or chills.  Mild nausea.  Diarrhea nonbloody nonbilious.  Minimal abdominal pain.  Worsening generalized malaise.  Patient also with noted prior hip replacements as well as recent evaluation within the [facility] system for right hip infection.  Minimal right hip pain.  No reported trauma. Presented to the ER afebrile, blood pressures 90s to 110s over 50s to 60s.  White count 15, hemoglobin 11, urinalysis indicative of infection-though no dysuria, creatinine 1.15.  CT abdomen pelvis negative for colitis but does show some diarrhea also with right hip 9 x 2.2 cm seroma with?  Infection.  Review of Systems: As mentioned in the history of present illness. All other systems reviewed and are negative. Past Medical History:  Diagnosis Date   Bedbound    since R hip surgery, cannot bear weight on R leg. since 07/02/22. Has not walked since then   Cervical cancer (HCC)    CKD stage 3b, GFR 30-44 ml/min (HCC)    Complication of anesthesia    History of kidney stones    Hypertension    PONV (postoperative nausea and vomiting)    rheumatoidArthritis    RA   Past Surgical History:  Procedure Laterality Date   ABDOMINAL HYSTERECTOMY  09/10/2003   CYSTOGRAM N/A 08/04/2018   Procedure: CYSTOGRAM WITH URETHAL DILATION;  Surgeon: Vanna Scotland, MD;  Location: [facility] ORS;  Service: Urology;  Laterality: N/A;   CYSTOSCOPY W/ RETROGRADES Bilateral 08/04/2018   Procedure: CYSTOSCOPY WITH RETROGRADE PYELOGRAM;  Surgeon: Vanna Scotland, MD;  Location: [facility] ORS;  Service: Urology;  Laterality: Bilateral;   CYSTOSCOPY/URETEROSCOPY/HOLMIUM LASER/STENT PLACEMENT Left 08/04/2018   Procedure: CYSTOSCOPY/URETEROSCOPY/HOLMIUM  LASER/STENT PLACEMENT;  Surgeon: Vanna Scotland, MD;  Location: [facility] ORS;  Service: Urology;  Laterality: Left;   JOINT REPLACEMENT     TOTAL HIP ARTHROPLASTY Right 1972   TOTAL HIP ARTHROPLASTY Left 1981   TOTAL KNEE  ARTHROPLASTY Right 1990   Social History:  reports that she quit smoking about 33 years ago. Her smoking use included cigarettes. She has never used smokeless tobacco. She reports that she does not drink alcohol and does not use drugs.  Allergies  Allergen Reactions   Codeine Nausea Only and Nausea And Vomiting   Augmentin [Amoxicillin-Pot Clavulanate] Nausea Only    Very nauseated/ feels sore on body     Family History  Problem Relation Age of Onset   Breast cancer Mother    Hypertension Father    Cancer Father    Prostate cancer Neg Hx    Kidney cancer Neg Hx    Bladder Cancer Neg Hx     Prior to Admission medications   Medication Sig Start Date End Date Taking? Authorizing Provider  cyanocobalamin 1000 MCG tablet Take 1 tablet (1,000 mcg total) by mouth daily. 01/20/23 04/20/23 Yes Gillis Santa, MD  diclofenac (VOLTAREN) 75 MG EC tablet Take 75 mg by mouth 2 (two) times daily.   Yes [provider]  gabapentin (NEURONTIN) 100 MG capsule Take 100 mg by mouth at bedtime. 10/16/22 10/16/23 Yes [provider]  lidocaine-prilocaine (EMLA) cream APPLY SPARINGLY 2-3 TIMES A DAY TO AREA OF RADIATION THERAPY IRRITATION. 05/11/20  Yes Bacigalupo, Marzella Schlein, MD  Multiple Vitamin (MULTIVITAMIN WITH MINERALS) TABS tablet Take 1 tablet by mouth daily. 01/20/23 04/20/23 Yes Gillis Santa, MD  oxyCODONE (OXY IR/ROXICODONE) 5 MG immediate release tablet Take 1 tablet (5 mg total) by mouth every 6 (six) hours as needed. 01/19/23  Yes Gillis Santa, MD  promethazine (PHENERGAN) 25 MG tablet Take 0.5 tablets (12.5 mg total) by mouth every 6 (six) hours as needed for up to 14 days for nausea or vomiting. 02/17/23 03/03/23 Yes Charise Killian, MD  saccharomyces boulardii (FLORASTOR) 250 MG capsule Take 1 capsule (250 mg total) by mouth 2 (two) times daily for 28 days. 02/17/23 03/17/23 Yes Charise Killian, MD  tretinoin (RETIN-A) 0.025 % cream Apply topically at bedtime.   Yes [provider]  Vitamin D, Ergocalciferol, (DRISDOL) 1.25 MG (50000 UNIT) CAPS capsule Take 1 capsule (50,000 Units total) by mouth every 7 (seven) days. 01/21/23 04/21/23 Yes Gillis Santa, MD    Physical Exam: Vitals:   02/27/23 0530 02/27/23 0757 02/27/23 0758 02/27/23 1000  BP: (!) 98/58 (!) 100/50  112/66  Pulse: 97 95  85  Resp:  20  16  Temp:  98.6 F (37 C)    TempSrc:  Oral    SpO2: 95% 100%  96%  Weight:   58.1 kg   Height:   5\' 4"  (1.626 m)    Physical Exam Constitutional:      Appearance: She is normal weight.  HENT:     Head: Normocephalic and atraumatic.     Nose: Nose normal.     Mouth/Throat:     Mouth: Mucous membranes are moist.  Eyes:     Pupils: Pupils are equal, round, and reactive to light.  Cardiovascular:     Rate and Rhythm: Normal rate and regular rhythm.  Pulmonary:     Effort: Pulmonary effort is normal.  Abdominal:     General: Abdomen is flat. Bowel sounds are normal.  Musculoskeletal:  General: Normal range of motion.     Cervical back: Normal range of motion.     Comments: No signficant R hip tenderness    Skin:    General: Skin is warm.  Neurological:     General: No focal deficit present.  Psychiatric:        Mood and Affect: Mood normal.     Data Reviewed:   There are no new results to review at this time.   CT ABDOMEN PELVIS W CONTRAST CLINICAL DATA:  Abdominal pain  EXAM: CT ABDOMEN AND PELVIS WITH CONTRAST  TECHNIQUE: Multidetector CT imaging of the abdomen and pelvis was performed using the standard protocol following bolus administration of intravenous contrast.  RADIATION DOSE REDUCTION: This exam was performed according to the departmental dose-optimization program which includes automated exposure control, adjustment of the mA and/or kV according to patient size and/or use of iterative reconstruction technique.  CONTRAST:  OMNIPAQUE IOHEXOL 300 MG/ML  SOLN  COMPARISON:   12/27/2022  FINDINGS: Lower chest: No acute abnormality.  Hepatobiliary: No focal liver abnormality is seen. Cholelithiasis. No biliary ductal dilatation.  Pancreas: Unremarkable. No pancreatic ductal dilatation or surrounding inflammatory changes.  Spleen: Normal in size without focal abnormality.  Adrenals/Urinary Tract: Adrenal glands are unremarkable. Multiple right parapelvic cysts. No right obstructive uropathy. Severe left renal atrophy. Moderate chronic left hydroureteronephrosis. Normal bladder.  Stomach/Bowel: Stomach is within normal limits. No evidence of bowel wall thickening, distention, or inflammatory changes. Small amount of fluid throughout the colon as can be seen with diarrhea.  Vascular/Lymphatic: Aortic atherosclerosis. No enlarged abdominal or pelvic lymph nodes.  Reproductive: Uterus and bilateral adnexa are unremarkable.  Other: No abdominal wall hernia or abnormality. No abdominopelvic ascites. Mild anasarca.  Musculoskeletal: No acute osseous abnormality. No aggressive osseous lesion. Generalized osteopenia. Chronic T12, L1 and L5 vertebral bodies. Bilateral total hip arthroplasties with susceptibility artifact resulting from the orthopedic hardware partially obscuring the adjacent soft tissue and osseous structures. Ankylosis of bilateral SI joints. Fluid collection in the subcutaneous fat overlying the right hip along the track of the surgical site measuring 8.9 x 2.2 cm likely reflecting a postoperative seroma. Secondarily infected postoperative seroma cannot be excluded.  IMPRESSION: 1. No acute abdominal or pelvic pathology. 2. Small amount of fluid throughout the colon as can be seen with diarrhea. 3. Cholelithiasis. 4. Severe left renal atrophy. Moderate chronic left hydroureteronephrosis. 5. Fluid collection in the subcutaneous fat overlying the right hip along the track of the surgical site measuring 8.9 x 2.2 cm likely reflecting a  postoperative seroma. Secondarily infected postoperative seroma cannot be excluded. 6.  Aortic Atherosclerosis (ICD10-I70.0).  Electronically Signed   By: Elige Ko M.D.   On: 02/27/2023 08:31  Lab Results  Component Value Date   WBC 14.7 (H) 02/27/2023   HGB 10.9 (L) 02/27/2023   HCT 34.8 (L) 02/27/2023   MCV 91.1 02/27/2023   PLT 441 (H) 02/27/2023   Last metabolic panel Lab Results  Component Value Date   GLUCOSE 116 (H) 02/27/2023   NA 141 02/27/2023   K 3.8 02/27/2023   CL 113 (H) 02/27/2023   CO2 19 (L) 02/27/2023   BUN 27 (H) 02/27/2023   CREATININE 1.15 (H) 02/27/2023   GFRNONAA 51 (L) 02/27/2023   CALCIUM 8.1 (L) 02/27/2023   PHOS 3.6 01/17/2023   PROT 6.1 (L) 02/27/2023   ALBUMIN 2.4 (L) 02/27/2023   LABGLOB 2.4 10/02/2019   AGRATIO 1.6 10/02/2019   BILITOT 0.5 02/27/2023   ALKPHOS  276 (H) 02/27/2023   AST 26 02/27/2023   ALT 40 02/27/2023   ANIONGAP 9 02/27/2023    Family Communication: Family at the bedside  Primary team communication: Dr. Modesto Charon made aware of need to transfer to [facility]  Thank you very much for involving [place] in the care of your patient.  Greater than 50% was spent in counseling and coordination of care with patient Total encounter time 80 minutes or more  Author: Floydene Flock, MD 02/27/2023 11:52 AM  For on call review [email or web address].

## 2023-02-27 NOTE — ED Notes (Signed)
[facility]  hospital  called  per  Dr  Modesto Charon MD

## 2023-02-28 ENCOUNTER — Observation Stay: Payer: Medicare HMO

## 2023-02-28 DIAGNOSIS — R112 Nausea with vomiting, unspecified: Secondary | ICD-10-CM | POA: Diagnosis present

## 2023-02-28 DIAGNOSIS — Z87891 Personal history of nicotine dependence: Secondary | ICD-10-CM | POA: Diagnosis not present

## 2023-02-28 DIAGNOSIS — Z880 Allergy status to penicillin: Secondary | ICD-10-CM | POA: Diagnosis not present

## 2023-02-28 DIAGNOSIS — Z79899 Other long term (current) drug therapy: Secondary | ICD-10-CM | POA: Diagnosis not present

## 2023-02-28 DIAGNOSIS — Z87440 Personal history of urinary (tract) infections: Secondary | ICD-10-CM | POA: Diagnosis not present

## 2023-02-28 DIAGNOSIS — R8271 Bacteriuria: Secondary | ICD-10-CM | POA: Diagnosis present

## 2023-02-28 DIAGNOSIS — T8451XA Infection and inflammatory reaction due to internal right hip prosthesis, initial encounter: Secondary | ICD-10-CM | POA: Diagnosis not present

## 2023-02-28 DIAGNOSIS — Z8541 Personal history of malignant neoplasm of cervix uteri: Secondary | ICD-10-CM | POA: Diagnosis not present

## 2023-02-28 DIAGNOSIS — Y831 Surgical operation with implant of artificial internal device as the cause of abnormal reaction of the patient, or of later complication, without mention of misadventure at the time of the procedure: Secondary | ICD-10-CM | POA: Diagnosis not present

## 2023-02-28 DIAGNOSIS — Z8249 Family history of ischemic heart disease and other diseases of the circulatory system: Secondary | ICD-10-CM | POA: Diagnosis not present

## 2023-02-28 DIAGNOSIS — N1831 Chronic kidney disease, stage 3a: Secondary | ICD-10-CM | POA: Diagnosis not present

## 2023-02-28 DIAGNOSIS — R197 Diarrhea, unspecified: Secondary | ICD-10-CM | POA: Diagnosis not present

## 2023-02-28 DIAGNOSIS — L7634 Postprocedural seroma of skin and subcutaneous tissue following other procedure: Secondary | ICD-10-CM | POA: Diagnosis not present

## 2023-02-28 DIAGNOSIS — Z885 Allergy status to narcotic agent status: Secondary | ICD-10-CM | POA: Diagnosis not present

## 2023-02-28 DIAGNOSIS — M0800 Unspecified juvenile rheumatoid arthritis of unspecified site: Secondary | ICD-10-CM | POA: Diagnosis not present

## 2023-02-28 DIAGNOSIS — Z87442 Personal history of urinary calculi: Secondary | ICD-10-CM | POA: Diagnosis not present

## 2023-02-28 DIAGNOSIS — M25451 Effusion, right hip: Secondary | ICD-10-CM | POA: Diagnosis not present

## 2023-02-28 DIAGNOSIS — Z803 Family history of malignant neoplasm of breast: Secondary | ICD-10-CM | POA: Diagnosis not present

## 2023-02-28 DIAGNOSIS — Z96641 Presence of right artificial hip joint: Secondary | ICD-10-CM | POA: Diagnosis not present

## 2023-02-28 DIAGNOSIS — Z96642 Presence of left artificial hip joint: Secondary | ICD-10-CM | POA: Diagnosis present

## 2023-02-28 DIAGNOSIS — L02416 Cutaneous abscess of left lower limb: Secondary | ICD-10-CM | POA: Diagnosis not present

## 2023-02-28 DIAGNOSIS — Z8616 Personal history of COVID-19: Secondary | ICD-10-CM | POA: Diagnosis not present

## 2023-02-28 DIAGNOSIS — Z7401 Bed confinement status: Secondary | ICD-10-CM | POA: Diagnosis not present

## 2023-02-28 DIAGNOSIS — I129 Hypertensive chronic kidney disease with stage 1 through stage 4 chronic kidney disease, or unspecified chronic kidney disease: Secondary | ICD-10-CM | POA: Diagnosis not present

## 2023-02-28 DIAGNOSIS — E860 Dehydration: Secondary | ICD-10-CM | POA: Diagnosis not present

## 2023-02-28 DIAGNOSIS — Z8619 Personal history of other infectious and parasitic diseases: Secondary | ICD-10-CM | POA: Diagnosis not present

## 2023-02-28 DIAGNOSIS — G8929 Other chronic pain: Secondary | ICD-10-CM | POA: Diagnosis not present

## 2023-02-28 DIAGNOSIS — Z90710 Acquired absence of both cervix and uterus: Secondary | ICD-10-CM | POA: Diagnosis not present

## 2023-02-28 DIAGNOSIS — Z96651 Presence of right artificial knee joint: Secondary | ICD-10-CM | POA: Diagnosis present

## 2023-02-28 LAB — BODY FLUID CELL COUNT WITH DIFFERENTIAL
Eos, Fluid: 0 %
Lymphs, Fluid: 92 %
Monocyte-Macrophage-Serous Fluid: 3 %
Neutrophil Count, Fluid: 5 %
Total Nucleated Cell Count, Fluid: 88 cu mm

## 2023-02-28 MED ORDER — LIDOCAINE HCL (PF) 1 % IJ SOLN
10.0000 mL | Freq: Once | INTRAMUSCULAR | Status: AC
  Administered 2023-02-28: 10 mL via INTRADERMAL
  Filled 2023-02-28: qty 10

## 2023-02-28 MED ORDER — OXYCODONE HCL 5 MG PO TABS
5.0000 mg | ORAL_TABLET | Freq: Four times a day (QID) | ORAL | Status: DC | PRN
  Administered 2023-02-28 – 2023-03-02 (×4): 5 mg via ORAL
  Filled 2023-02-28 (×4): qty 1

## 2023-02-28 MED ORDER — GERHARDT'S BUTT CREAM
TOPICAL_CREAM | Freq: Two times a day (BID) | CUTANEOUS | Status: DC | PRN
  Administered 2023-02-28: 1 via TOPICAL
  Filled 2023-02-28 (×2): qty 1

## 2023-02-28 MED ORDER — GABAPENTIN 100 MG PO CAPS
100.0000 mg | ORAL_CAPSULE | Freq: Every day | ORAL | Status: DC
  Administered 2023-02-28 – 2023-03-01 (×2): 100 mg via ORAL
  Filled 2023-02-28 (×2): qty 1

## 2023-02-28 MED ORDER — LOPERAMIDE HCL 2 MG PO CAPS
2.0000 mg | ORAL_CAPSULE | ORAL | Status: DC | PRN
  Administered 2023-02-28 – 2023-03-02 (×9): 2 mg via ORAL
  Filled 2023-02-28 (×9): qty 1

## 2023-02-28 NOTE — Consult Note (Signed)
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  She has been readmitted at least twice in the past 6 months due to C diff colitis and recurrent UTIs.  She again presented to [facility] 6/19 with complaint of diarrhea.  Imaging also showed a fluid collection in the subcutaneous fat overlying the right hip along the track of the surgical site measuring 8.9 x 2.2 cm.  IR consulted for soft tissue aspiration.  Case reviewed and approved by Dr. Juliette Alcide.  Discussed with patient who is agreeable to proceed.  She is aware case will be with local anesthetic only.  She may eat/drink.  Procedure to be completed as IR/US schedule can accommodate. ______________________________________________________________________   History of Present Illness: Jeanne [name] is a 72 y.o. 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 She has been admitted to [facility] with C. Diff colitis/recurrent UTIs.  Now also found to have a soft tissue fluid collection extending from the surgical site in the right hip.  IR consulted for aspiration.   Allergies and medications reviewed.   Review of Systems: A 12 point ROS discussed and pertinent positives are indicated in the HPI above.  All other systems are negative.  Review of Systems  Constitutional:  Negative for fatigue and fever.  Respiratory:  Negative for cough and shortness of breath.   Cardiovascular:  Negative for chest pain.  Gastrointestinal:  Positive for abdominal pain and diarrhea. Negative for rectal pain and vomiting.  Musculoskeletal:  Negative for back pain.  Psychiatric/Behavioral:  Negative for behavioral problems and confusion.     Vital Signs: BP 128/62 (BP Location: Right Arm)   Pulse 81   Temp 97.8 F (36.6 C) (Oral)   Resp 18   Ht 5\' 4"  (1.626  m)   Wt 145 lb 1 oz (65.8 kg)   SpO2 100%   BMI 24.90 kg/m   Physical Exam Constitutional:      General: She is not in acute distress.    Appearance: Normal appearance. She is not ill-appearing.  HENT:     Mouth/Throat:     Mouth: Mucous membranes are moist.     Pharynx: Oropharynx is clear.  Cardiovascular:     Rate and Rhythm: Normal rate and regular rhythm.  Abdominal:     General: Abdomen is flat.     Palpations: Abdomen is soft.  Skin:    General: Skin is warm and dry.  Neurological:     General: No focal deficit present.     Mental Status: She is alert and oriented to person, place, and time.  Psychiatric:        Mood and Affect: Mood normal.        Behavior: Behavior normal.        Thought Content: Thought content normal.        Judgment: Judgment normal.      Imaging reviewed.   Labs:  COAGS: Recent Labs    11/10/22 0426 02/10/23 1538  INR 1.4* 1.2    BMP: Recent Labs    02/14/23 0957 02/16/23 0533 02/17/23 0529 02/27/23 0528  NA 139 137 137 141  K 4.1 3.9 3.5 3.8  CL 111 107 109 113*  CO2 20* 21* 20* 19*  GLUCOSE 129* 110* 108* 116*  BUN 11 12 14  27*  CALCIUM 8.6* 8.6*  8.2* 8.1*  CREATININE 1.20* 1.22* 1.19* 1.15*  GFRNONAA 49* 48* 49* 51*       Electronically Signed: Hoyt Koch, PA 02/28/2023, 9:19 AM   I spent a total of 15 minutes in face to face in clinical consultation, greater than 50% of which was counseling/coordinating care for right hip soft tissue collection.

## 2023-02-28 NOTE — Progress Notes (Addendum)
[facility] Gastroenterology Inpatient Progress Note    Subjective: Patient seen for f/u diarrhea. Began on empiric azithromycin yesterday given previous hx of Campylobacter and elevated wbc. Patient "better" this AM and would like to have something to eat. Reportedly had only 2 loose stools in last 12-16 hours.  Objective: Vital signs in last 24 hours: Temp:  [97.5 F (36.4 C)-98 F (36.7 C)] 97.8 F (36.6 C) (06/20 0732) Pulse Rate:  [81-88] 81 (06/20 0732) Resp:  [12-18] 18 (06/20 0732) BP: (107-129)/(56-66) 128/62 (06/20 0732) SpO2:  [96 %-100 %] 100 % (06/20 0732) Weight:  [65.8 kg] 65.8 kg (06/19 1506) Blood pressure 128/62, pulse 81, temperature 97.8 F (36.6 C), temperature source Oral, resp. rate 18, height 5\' 4"  (1.626 m), weight 65.8 kg, SpO2 100 %.    Intake/Output from previous day: 06/19 0701 - 06/20 0700 In: 1130 [P.O.:30; IV Piggyback:1100] Out: -   Intake/Output this shift: No intake/output data recorded.   Gen: NAD. Appears comfortable.  HEENT: [place]/AT. PERRLA. Normal external ear exam.  Chest: CTA, no wheezes.  CV: RR nl S1, S2. No gallops.  Abd: soft, nt, nd. BS+  Ext: no edema. Pulses 2+  Neuro: Alert and oriented. Judgement appears normal. Nonfocal.   Lab Results: Results for orders placed or performed during the hospital encounter of 02/27/23 (from the past 24 hour(s))  Urinalysis, Routine w reflex microscopic -Urine, Clean Catch     Status: Abnormal   Collection Time: 02/27/23 10:16 AM  Result Value Ref Range   Color, Urine YELLOW (A) YELLOW   APPearance TURBID (A) CLEAR   Specific Gravity, Urine 1.032 (H) 1.005 - 1.030   pH 6.0 5.0 - 8.0   Glucose, UA NEGATIVE NEGATIVE mg/dL   Hgb urine dipstick NEGATIVE NEGATIVE   Bilirubin Urine NEGATIVE NEGATIVE   Ketones, ur NEGATIVE NEGATIVE mg/dL   Protein, ur 409 (A) NEGATIVE mg/dL   Nitrite POSITIVE (A) NEGATIVE   Leukocytes,Ua LARGE (A) NEGATIVE   RBC / HPF 21-50 0 - 5 RBC/hpf   WBC, UA  >50 0 - 5 WBC/hpf   Bacteria, UA MANY (A) NONE SEEN   Squamous Epithelial / HPF NONE SEEN 0 - 5 /HPF     Recent Labs    02/27/23 0528  WBC 14.7*  HGB 10.9*  HCT 34.8*  PLT 441*   BMET Recent Labs    02/27/23 0528  NA 141  K 3.8  CL 113*  CO2 19*  GLUCOSE 116*  BUN 27*  CREATININE 1.15*  CALCIUM 8.1*   LFT Recent Labs    02/27/23 0528  PROT 6.1*  ALBUMIN 2.4*  AST 26  ALT 40  ALKPHOS 276*  BILITOT 0.5   PT/INR No results for input(s): "LABPROT", "INR" in the last 72 hours. Hepatitis Panel No results for input(s): "HEPBSAG", "HCVAB", "HEPAIGM", "HEPBIGM" in the last 72 hours. C-Diff Recent Labs    02/27/23 0528  CDIFFTOX NEGATIVE   No results for input(s): "CDIFFPCR" in the last 72 hours.   Studies/Results: CT ABDOMEN PELVIS W CONTRAST  Result Date: 02/27/2023 CLINICAL DATA:  Abdominal pain EXAM: CT ABDOMEN AND PELVIS WITH CONTRAST TECHNIQUE: Multidetector CT imaging of the abdomen and pelvis was performed using the standard protocol following bolus administration of intravenous contrast. RADIATION DOSE REDUCTION: This exam was performed according to the departmental dose-optimization program which includes automated exposure control, adjustment of the mA and/or kV according to patient size and/or use of iterative reconstruction technique. CONTRAST:  OMNIPAQUE IOHEXOL 300 MG/ML  SOLN COMPARISON:  12/27/2022 FINDINGS: Lower chest: No acute abnormality. Hepatobiliary: No focal liver abnormality is seen. Cholelithiasis. No biliary ductal dilatation. Pancreas: Unremarkable. No pancreatic ductal dilatation or surrounding inflammatory changes. Spleen: Normal in size without focal abnormality. Adrenals/Urinary Tract: Adrenal glands are unremarkable. Multiple right parapelvic cysts. No right obstructive uropathy. Severe left renal atrophy. Moderate chronic left hydroureteronephrosis. Normal bladder. Stomach/Bowel: Stomach is within normal limits. No evidence of bowel  wall thickening, distention, or inflammatory changes. Small amount of fluid throughout the colon as can be seen with diarrhea. Vascular/Lymphatic: Aortic atherosclerosis. No enlarged abdominal or pelvic lymph nodes. Reproductive: Uterus and bilateral adnexa are unremarkable. Other: No abdominal wall hernia or abnormality. No abdominopelvic ascites. Mild anasarca. Musculoskeletal: No acute osseous abnormality. No aggressive osseous lesion. Generalized osteopenia. Chronic T12, L1 and L5 vertebral bodies. Bilateral total hip arthroplasties with susceptibility artifact resulting from the orthopedic hardware partially obscuring the adjacent soft tissue and osseous structures. Ankylosis of bilateral SI joints. Fluid collection in the subcutaneous fat overlying the right hip along the track of the surgical site measuring 8.9 x 2.2 cm likely reflecting a postoperative seroma. Secondarily infected postoperative seroma cannot be excluded. IMPRESSION: 1. No acute abdominal or pelvic pathology. 2. Small amount of fluid throughout the colon as can be seen with diarrhea. 3. Cholelithiasis. 4. Severe left renal atrophy. Moderate chronic left hydroureteronephrosis. 5. Fluid collection in the subcutaneous fat overlying the right hip along the track of the surgical site measuring 8.9 x 2.2 cm likely reflecting a postoperative seroma. Secondarily infected postoperative seroma cannot be excluded. 6.  Aortic Atherosclerosis (ICD10-I70.0). Electronically Signed   By: Elige Ko M.D.   On: 02/27/2023 08:31    Scheduled Inpatient Medications:    azithromycin  500 mg Oral Daily   enoxaparin (LOVENOX) injection  40 mg Subcutaneous Q24H   gabapentin  100 mg Oral QHS    Continuous Inpatient Infusions:    PRN Inpatient Medications:  HYDROmorphone (DILAUDID) injection, ondansetron **OR** ondansetron (ZOFRAN) IV, oxyCODONE  Miscellaneous: N/a  Assessment:  Acute diarrhea -Much improved today (02/28/23).. Ddx includes acute  infectious colitis, medication side effect, viral gastroenteritis, recurrent Clostridioides diarrhea, Campylobacter, despite negative initial stool tests. Seroma of Right hip. Initially thought prudent to transfer to her surgeon at [facility], they have denied the transfer. Patient to be seen by Dr. Vivianne Master, on call orthopedist. Generalized abdominal pain - Listed as primary diagnosis at admission, though patient denies abdominal pain during my evaluation.Would not be unexpected, however, given recurrent colitis symptoms. CT showed no acute inflammatory process. Nausea/vomiting. Currently asymptomatic in this regard. History of Cervical cancer. Dehydration secondary to #1 and #4.  Plan:  Continue daily Azithromycin for now. Await stool culture, O/P. GI panel and Cdiff were negative yesterday. Resume clears. Advance diet as tolerated.  Burnard Enis K. Norma Fredrickson, M.D. 02/28/2023, 8:45 AM

## 2023-02-28 NOTE — TOC Initial Note (Addendum)
Transition of Care [facility]) - Initial/Assessment Note    Patient Details  Name: Jeanne [name] MRN: [id number] Date of Birth: Oct 13, 1950  [facility]) CM/SW Contact:    Chapman Fitch, RN Phone Number: 02/28/2023, 2:51 PM  Clinical Narrative:                  Admitted for: diarrhea and Right hip joint and soft tissue fluid collection  Admitted from: home with husband and daughter  PCP: Burnett Sheng Current home health/prior home health/DME:  Agricultural consultant (2 wheels);Wheelchair - Dentist;Other (comment);Adaptive equipment Additional Comments: has bilat platforms for RW, has dressing stick   Message sent to [place] with [facility] to confirm patient is still active   Plan for aspiration of soft fluid collection.  Please consult TOC for discharge needs  Expected Discharge Plan: Home w Home Health Services     Patient Goals and CMS Choice            Expected Discharge Plan and Services                                              Prior Living Arrangements/Services                       Activities of Daily Living Home Assistive Devices/Equipment: Cane (specify quad or straight) ADL Screening (condition at time of admission) Patient's cognitive ability adequate to safely complete daily activities?: Yes Is the patient deaf or have difficulty hearing?: No Does the patient have difficulty seeing, even when wearing glasses/contacts?: No Does the patient have difficulty concentrating, remembering, or making decisions?: No Patient able to express need for assistance with ADLs?: Yes Does the patient have difficulty dressing or bathing?: No Independently performs ADLs?: Yes (appropriate for developmental age) Does the patient have difficulty walking or climbing stairs?: No Weakness of Legs: None Weakness of Arms/Hands: None  Permission Sought/Granted                  Emotional Assessment               Admission diagnosis:  Diarrhea [R19.7] Generalized abdominal pain [R10.84] Diarrhea, unspecified type [R19.7] Nausea and vomiting, unspecified vomiting type [R11.2] Patient Active Problem List   Diagnosis Date Noted   Diarrhea 02/27/2023   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) 02/27/2023   Elevated alkaline phosphatase level 02/11/2023   SIRS (systemic inflammatory response syndrome) (HCC) 02/11/2023   Diarrhea of presumed infectious origin 02/11/2023   Colitis due to Campylobacter species 02/10/2023   Asymptomatic bacteriuria 02/10/2023   Difficulty walking 02/10/2023   Hypomagnesemia 01/08/2023   Hypokalemia 01/08/2023   CKD stage 3b, GFR 30-44 ml/min (HCC) 01/03/2023   Hypotension 01/02/2023   Pancolitis (HCC) 12/29/2022   History of Clostridioides difficile colitis 12/29/2022   Shock circulatory (HCC) 12/28/2022   Sepsis (HCC) 11/10/2022   Nausea vomiting and diarrhea 11/09/2022   Clostridium difficile diarrhea 11/09/2022   COVID-19 virus infection 11/09/2022   AKI (acute kidney injury) (HCC) 11/09/2022   Anemia 11/09/2022   Fungal infection of skin 09/23/2019   UTI (urinary tract infection) 07/15/2017   Dysuria 01/28/2017   Chronic radiation cystitis 02/28/2015   Coitalgia 02/28/2015   Blood pressure elevated 02/28/2015   Calculus of kidney 02/28/2015   Arthritis or polyarthritis, rheumatoid (HCC) 02/28/2015   Basal cell  papilloma 02/28/2015   H/O cataract extraction 09/22/2014   Combined form of senile cataract 07/07/2014   NS (nuclear sclerosis) 05/21/2014   History of repair of hip joint 03/16/2013   H/O total knee replacement 03/16/2013   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 [place] [address] Phone: [phone] Fax: [phone]     Social Determinants of Health (SDOH) Social History: SDOH Screenings   Food  Insecurity: No Food Insecurity (02/27/2023)  Housing: Low Risk  (02/27/2023)  Transportation Needs: No Transportation Needs (02/27/2023)  Utilities: Not At Risk (02/27/2023)  Alcohol Screen: Low Risk  (08/10/2020)  Depression (PHQ2-9): Low Risk  (08/10/2020)  Financial Resource Strain: Low Risk  (08/10/2020)  Physical Activity: Inactive (08/10/2020)  Social Connections: Moderately Integrated (08/10/2020)  Stress: No Stress Concern Present (08/10/2020)  Tobacco Use: Medium Risk (02/27/2023)   SDOH Interventions:     Readmission Risk Interventions    01/02/2023   12:24 PM  Readmission Risk Prevention Plan  Transportation Screening Complete  PCP or Specialist Appt within 3-5 Days Complete  Social Work Consult for Recovery Care Planning/Counseling Complete  Palliative Care Screening Not Applicable  Medication Review Oceanographer) Complete

## 2023-02-28 NOTE — Progress Notes (Signed)
Aerobic/anaerobic sample was obtained per Dr. Juliette Alcide

## 2023-02-28 NOTE — Progress Notes (Signed)
Pt continues to have loose and mushy stool times 3 tonight. Sample sent to lab. ISO precautions maintained. Skin cream, powder, and sacral foam applied for skin protection.

## 2023-02-28 NOTE — Progress Notes (Signed)
PROGRESS NOTE    Jeanne [name]  HKV:[id number] DOB: 12/31/1950 DOA: 02/27/2023 PCP: Jerl Mina, MD     Brief Narrative:   From admission h and p  Jeanne [name] is a 72 y.o. female with past medical history of juvenile rheumatoid arthritis, hypertension, CKD, cervical cancer, C. Difficile who presented for recurrent diarrhea.  Patient noted to have been admitted June 2 through June 9 for recurrent diarrhea with noted diagnosis of Campylobacter status post course of azithromycin as well as completed course of C. difficile colitis.  Patient noted with multiple admissions for similar issues over the past 2 to 3 months.  Worsening diarrhea over the past 24 hours.  No fevers or chills.  Mild nausea.  Diarrhea nonbloody nonbilious.  Minimal abdominal pain.  Worsening generalized malaise.  Patient also with noted prior hip replacements as well as recent evaluation within the [facility] system for right hip infection.  Minimal right hip pain.  No reported trauma. Presented to the ER afebrile, blood pressures 90s to 110s over 50s to 60s.  White count 15, hemoglobin 11, urinalysis indicative of infection-though no dysuria, creatinine 1.15.  CT abdomen pelvis negative for colitis but does show some diarrhea also with right hip 9 x 2.2 cm seroma with?  Infection.  Assessment & Plan:   Principal Problem:   Diarrhea Active Problems:   CKD stage 3b, GFR 30-44 ml/min (HCC)   Asymptomatic bacteriuria   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)  # Diarrhea Subacute. Recently completed vanc taper for c diff, c diff negative this admission. Recent hospitalization campylobacter positive, treated with azithromycin, gi pathogen panel negative. No concerning findings on CT. GI consulted - stool culture and o & p ordered - repeat course of azithromycin advised by GI and is ordered  # Right hip fluid collection # Hx multiple surgeries right hip Seroma, possible infection. Ortho consulted, advises IR  aspiration with culture. Followed by [facility]. History infection in right hip in 2023 - message sent to IR to see about aspiration  # Bacteriuria # Hx recurrent uti Patient initially says no dysuria or other uti symptoms then says perhaps mild pain. Urinalysis consistent w/ bacteriuria.  - will monitor symptoms, treat if clear symptoms uti - f/u culture  # History Juvenile RA Bedbound at baseline, desires return to home  # Chronic pain - home oxy, gabapentin  # Recurrent UTI  # CKD 3a Kidney function is at baseline    DVT prophylaxis: lovenox Code Status: full Family Communication: daughter present at bedside  Level of care: Med-Surg     Consultants:  Ir, gi, ortho  Procedures: pending  Antimicrobials:  azithromycin    Subjective: No right hip pain, having loose/watery stool, no abd pain  Objective: Vitals:   02/27/23 1506 02/27/23 1958 02/28/23 0536 02/28/23 0732  BP:  (!) 107/56 113/66 128/62  Pulse:  83 85 81  Resp:  18 18 18   Temp:  97.8 F (36.6 C) (!) 97.5 F (36.4 C) 97.8 F (36.6 C)  TempSrc:  Oral Oral Oral  SpO2:  100% 100% 100%  Weight: 65.8 kg     Height:        Intake/Output Summary (Last 24 hours) at 02/28/2023 0825 Last data filed at 02/27/2023 2300 Gross per 24 hour  Intake 130 ml  Output --  Net 130 ml   Filed Weights   02/27/23 0758 02/27/23 1506  Weight: 58.1 kg 65.8 kg    Examination:  General exam: Appears  calm and comfortable  Respiratory system: Clear to auscultation. Respiratory effort normal. Cardiovascular system: S1 & S2 heard, RRR. No JVD, murmurs, rubs, gallops or clicks. No pedal edema. Gastrointestinal system: Abdomen is nondistended, soft and nontender. No organomegaly or masses felt. Normal bowel sounds heard. Central nervous system: Alert and oriented. No focal neurological deficits. Extremities: Symmetric 5 x 5 power. Skin: some induration right hip incision scar Psychiatry: Judgement and insight  appear normal. Mood & affect appropriate.     Data Reviewed: I have personally reviewed following labs and imaging studies  CBC: Recent Labs  Lab 02/27/23 0528  WBC 14.7*  NEUTROABS 13.7*  HGB 10.9*  HCT 34.8*  MCV 91.1  PLT 441*   Basic Metabolic Panel: Recent Labs  Lab 02/27/23 0528  NA 141  K 3.8  CL 113*  CO2 19*  GLUCOSE 116*  BUN 27*  CREATININE 1.15*  CALCIUM 8.1*   GFR: Estimated Creatinine Clearance: 42.5 mL/min (A) (by C-G formula based on SCr of 1.15 mg/dL (H)). Liver Function Tests: Recent Labs  Lab 02/27/23 0528  AST 26  ALT 40  ALKPHOS 276*  BILITOT 0.5  PROT 6.1*  ALBUMIN 2.4*   Recent Labs  Lab 02/27/23 0528  LIPASE 24   No results for input(s): "AMMONIA" in the last 168 hours. Coagulation Profile: No results for input(s): "INR", "PROTIME" in the last 168 hours. Cardiac Enzymes: No results for input(s): "CKTOTAL", "CKMB", "CKMBINDEX", "TROPONINI" in the last 168 hours. BNP (last 3 results) No results for input(s): "PROBNP" in the last 8760 hours. HbA1C: No results for input(s): "HGBA1C" in the last 72 hours. CBG: No results for input(s): "GLUCAP" in the last 168 hours. Lipid Profile: No results for input(s): "CHOL", "HDL", "LDLCALC", "TRIG", "CHOLHDL", "LDLDIRECT" in the last 72 hours. Thyroid Function Tests: No results for input(s): "TSH", "T4TOTAL", "FREET4", "T3FREE", "THYROIDAB" in the last 72 hours. Anemia Panel: No results for input(s): "VITAMINB12", "FOLATE", "FERRITIN", "TIBC", "IRON", "RETICCTPCT" in the last 72 hours. Urine analysis:    Component Value Date/Time   COLORURINE YELLOW (A) 02/27/2023 1016   APPEARANCEUR TURBID (A) 02/27/2023 1016   APPEARANCEUR Cloudy (A) 10/21/2018 0841   LABSPEC 1.032 (H) 02/27/2023 1016   PHURINE 6.0 02/27/2023 1016   GLUCOSEU NEGATIVE 02/27/2023 1016   HGBUR NEGATIVE 02/27/2023 1016   BILIRUBINUR NEGATIVE 02/27/2023 1016   BILIRUBINUR negative 07/15/2020 0827   BILIRUBINUR Negative  10/21/2018 0841   KETONESUR NEGATIVE 02/27/2023 1016   PROTEINUR 100 (A) 02/27/2023 1016   UROBILINOGEN 0.2 07/15/2020 0827   NITRITE POSITIVE (A) 02/27/2023 1016   LEUKOCYTESUR LARGE (A) 02/27/2023 1016   Sepsis Labs: @LABRCNTIP (procalcitonin:4,lacticidven:4)  ) Recent Results (from the past 240 hour(s))  C Difficile Quick Screen w PCR reflex     Status: None   Collection Time: 02/27/23  5:28 AM   Specimen: STOOL  Result Value Ref Range Status   C Diff antigen NEGATIVE NEGATIVE Final   C Diff toxin NEGATIVE NEGATIVE Final   C Diff interpretation No C. difficile detected.  Final    Comment: Performed at [facility], [address] [place], [place] [address]  Gastrointestinal Panel by PCR , Stool     Status: None   Collection Time: 02/27/23  5:28 AM   Specimen: STOOL  Result Value Ref Range Status   Campylobacter species NOT DETECTED NOT DETECTED Final   Plesimonas shigelloides NOT DETECTED NOT DETECTED Final   Salmonella species NOT DETECTED NOT DETECTED Final   Yersinia enterocolitica NOT DETECTED NOT  DETECTED Final   Vibrio species NOT DETECTED NOT DETECTED Final   Vibrio cholerae NOT DETECTED NOT DETECTED Final   Enteroaggregative E coli (EAEC) NOT DETECTED NOT DETECTED Final   Enteropathogenic E coli (EPEC) NOT DETECTED NOT DETECTED Final   Enterotoxigenic E coli (ETEC) NOT DETECTED NOT DETECTED Final   Shiga like toxin producing E coli (STEC) NOT DETECTED NOT DETECTED Final   Shigella/Enteroinvasive E coli (EIEC) NOT DETECTED NOT DETECTED Final   Cryptosporidium NOT DETECTED NOT DETECTED Final   Cyclospora cayetanensis NOT DETECTED NOT DETECTED Final   Entamoeba histolytica NOT DETECTED NOT DETECTED Final   Giardia lamblia NOT DETECTED NOT DETECTED Final   Adenovirus F40/41 NOT DETECTED NOT DETECTED Final   Astrovirus NOT DETECTED NOT DETECTED Final   Norovirus GI/GII NOT DETECTED NOT DETECTED Final   Rotavirus A NOT DETECTED NOT DETECTED Final   Sapovirus  (I, II, IV, and V) NOT DETECTED NOT DETECTED Final    Comment: Performed at [facility], [address], [place], [place] [address]         Radiology Studies: CT ABDOMEN PELVIS W CONTRAST  Result Date: 02/27/2023 CLINICAL DATA:  Abdominal pain EXAM: CT ABDOMEN AND PELVIS WITH CONTRAST TECHNIQUE: Multidetector CT imaging of the abdomen and pelvis was performed using the standard protocol following bolus administration of intravenous contrast. RADIATION DOSE REDUCTION: This exam was performed according to the departmental dose-optimization program which includes automated exposure control, adjustment of the mA and/or kV according to patient size and/or use of iterative reconstruction technique. CONTRAST:  OMNIPAQUE IOHEXOL 300 MG/ML  SOLN COMPARISON:  12/27/2022 FINDINGS: Lower chest: No acute abnormality. Hepatobiliary: No focal liver abnormality is seen. Cholelithiasis. No biliary ductal dilatation. Pancreas: Unremarkable. No pancreatic ductal dilatation or surrounding inflammatory changes. Spleen: Normal in size without focal abnormality. Adrenals/Urinary Tract: Adrenal glands are unremarkable. Multiple right parapelvic cysts. No right obstructive uropathy. Severe left renal atrophy. Moderate chronic left hydroureteronephrosis. Normal bladder. Stomach/Bowel: Stomach is within normal limits. No evidence of bowel wall thickening, distention, or inflammatory changes. Small amount of fluid throughout the colon as can be seen with diarrhea. Vascular/Lymphatic: Aortic atherosclerosis. No enlarged abdominal or pelvic lymph nodes. Reproductive: Uterus and bilateral adnexa are unremarkable. Other: No abdominal wall hernia or abnormality. No abdominopelvic ascites. Mild anasarca. Musculoskeletal: No acute osseous abnormality. No aggressive osseous lesion. Generalized osteopenia. Chronic T12, L1 and L5 vertebral bodies. Bilateral total hip arthroplasties with susceptibility artifact resulting from  the orthopedic hardware partially obscuring the adjacent soft tissue and osseous structures. Ankylosis of bilateral SI joints. Fluid collection in the subcutaneous fat overlying the right hip along the track of the surgical site measuring 8.9 x 2.2 cm likely reflecting a postoperative seroma. Secondarily infected postoperative seroma cannot be excluded. IMPRESSION: 1. No acute abdominal or pelvic pathology. 2. Small amount of fluid throughout the colon as can be seen with diarrhea. 3. Cholelithiasis. 4. Severe left renal atrophy. Moderate chronic left hydroureteronephrosis. 5. Fluid collection in the subcutaneous fat overlying the right hip along the track of the surgical site measuring 8.9 x 2.2 cm likely reflecting a postoperative seroma. Secondarily infected postoperative seroma cannot be excluded. 6.  Aortic Atherosclerosis (ICD10-I70.0). Electronically Signed   By: Elige Ko M.D.   On: 02/27/2023 08:31        Scheduled Meds:  azithromycin  500 mg Oral Daily   enoxaparin (LOVENOX) injection  40 mg Subcutaneous Q24H   Continuous Infusions:   LOS: 0 days     Anette Riedel B  Hermon Zea, MD [facility]   If 7PM-7AM, please contact night-coverage [email or web address] Password [email or web address] 02/28/2023, 8:25 AM

## 2023-03-01 LAB — PATHOLOGIST SMEAR REVIEW

## 2023-03-01 NOTE — Progress Notes (Signed)
[facility] Gastroenterology Inpatient Progress Note    Subjective: Patient seen for f/u diarrhea. Stool culture and O & P still pending. Diarrhea improved on azithromycin and loperamide. No abdominal pain.  Objective: Vital signs in last 24 hours: Temp:  [97.8 F (36.6 C)-98.3 F (36.8 C)] 98.3 F (36.8 C) (06/21 1627) Pulse Rate:  [70-81] 81 (06/21 1627) Resp:  [17-18] 18 (06/21 1627) BP: (110-125)/(60-65) 114/61 (06/21 1627) SpO2:  [100 %] 100 % (06/21 1627) Blood pressure 114/61, pulse 81, temperature 98.3 F (36.8 C), temperature source Oral, resp. rate 18, height 5\' 4"  (1.626 m), weight 65.8 kg, SpO2 100 %.    Intake/Output from previous day: 06/20 0701 - 06/21 0700 In: 120 [P.O.:120] Out: -   Intake/Output this shift: No intake/output data recorded.   Gen: NAD. Appears comfortable.  HEENT: [place]/AT. PERRLA. Normal external ear exam.  Chest: CTA, no wheezes.  CV: RR nl S1, S2. No gallops.  Abd: soft, nt, nd. BS+  Ext: no edema. Pulses 2+  Neuro: Alert and oriented. Judgement appears normal. Nonfocal.   Lab Results: No results found for this or any previous visit (from the past 24 hour(s)).   Recent Labs    02/27/23 0528  WBC 14.7*  HGB 10.9*  HCT 34.8*  PLT 441*   BMET Recent Labs    02/27/23 0528  NA 141  K 3.8  CL 113*  CO2 19*  GLUCOSE 116*  BUN 27*  CREATININE 1.15*  CALCIUM 8.1*   LFT Recent Labs    02/27/23 0528  PROT 6.1*  ALBUMIN 2.4*  AST 26  ALT 40  ALKPHOS 276*  BILITOT 0.5   PT/INR No results for input(s): "LABPROT", "INR" in the last 72 hours. Hepatitis Panel No results for input(s): "HEPBSAG", "HCVAB", "HEPAIGM", "HEPBIGM" in the last 72 hours. C-Diff Recent Labs    02/27/23 0528  CDIFFTOX NEGATIVE   No results for input(s): "CDIFFPCR" in the last 72 hours.   Studies/Results: [place] FNA SOFT TISSUE  Result Date: 02/28/2023 INDICATION: Subcutaneous fluid collection around left hip EXAM: Ultrasound-guided  aspiration of subcutaneous fluid collection surrounding the left hip MEDICATIONS: None. ANESTHESIA/SEDATION: Local analgesia COMPLICATIONS: None immediate. PROCEDURE: Informed written consent was obtained from the patient after a thorough discussion of the procedural risks, benefits and alternatives. All questions were addressed. Maximal Sterile Barrier Technique was utilized including caps, mask, sterile gowns, sterile gloves, sterile drape, hand hygiene and skin antiseptic. A timeout was performed prior to the initiation of the procedure. The patient was placed supine on the exam table. Ultrasound of the left hip superficial tissues demonstrated hypoechoic fluid collection. Skin entry site was marked, and the overlying skin was prepped draped in the standard sterile fashion. Local analgesia was obtained with 1% lidocaine. Using ultrasound guidance, a 19 gauge Yueh catheter was advanced into the identified fluid collection. Subsequently, approximately 5 mL of thin, straw-colored fluid was aspirated. A sample was sent to the lab for microbiology analysis. A clean dressing was placed. The patient tolerated the procedure well without immediate complication. IMPRESSION: Successful ultrasound-guided aspiration/sampling of superficial collection adjacent to the left hip, yielding 5 mL of thin serous fluid, most compatible with a seroma. Sample was sent for microbiology analysis. Electronically Signed   By: Olive Bass M.D.   On: 02/28/2023 15:47    Scheduled Inpatient Medications:    azithromycin  500 mg Oral Daily   enoxaparin (LOVENOX) injection  40 mg Subcutaneous Q24H   gabapentin  100 mg Oral QHS  Continuous Inpatient Infusions:    PRN Inpatient Medications:  Gerhardt's butt cream, HYDROmorphone (DILAUDID) injection, loperamide, ondansetron **OR** ondansetron (ZOFRAN) IV, oxyCODONE  Miscellaneous: N/A  Assessment:  Chronic pain. Acute diarrhea - improved with empiric azithromycin Recent  Campylobacter diarrhea  Right hip seroma.  Plan:  Continue Azithromycin one more day, then discontinue. Continue diet as tolerated. Await stool culture, O & P. May be discharged from a GI standpoint. GI sign off. Call back in the interim if needed.Jeanne [name]. Norma Fredrickson, M.D. 03/01/2023, 4:39 PM

## 2023-03-01 NOTE — TOC Initial Note (Signed)
Transition of Care [facility]) - Initial/Assessment Note    Patient Details  Name: Jeanne [name] MRN: [id number] Date of Birth: 30-Jun-1951  [facility] CM/SW Contact:    Margarito Liner, LCSW Phone Number: 03/01/2023, 10:59 AM  Clinical Narrative:  Readmission prevention screen complete. CSW met with patient. Daughter asleep at bedside. CSW introduced role and explained that discharge planning would be discussed. PCP is Jerl Mina, MD. Her husband transports her to appointments. [facility] [address] No issues obtaining medications. Patient lives home with her husband and daughter. She confirmed she is still active with [facility] Liaison stated she is active with PT and RN services. She has a wheelchair, hospital bed, scooter, BSC, and shower chair at home. Patient typically requires EMS transport home and confirmed she will need this, this admission. Address on facesheet is correct. No further concerns. CSW encouraged patient to contact CSW as needed. CSW will continue to follow patient for support and facilitate return home once stable.                Expected Discharge Plan: Home w Home Health Services Barriers to Discharge: Continued Medical Work up   Patient Goals and CMS Choice     Choice offered to / list presented to : Patient      Expected Discharge Plan and Services     Post Acute Care Choice: Resumption of Svcs/PTA Provider Living arrangements for the past 2 months: Single Family Home                           HH Arranged: RN, PT [facility] Agency: [facility] Date [facility] Agency Contacted: 03/01/23   Representative spoke with at [facility] Agency: Gearldine Bienenstock  Prior Living Arrangements/Services Living arrangements for the past 2 months: Single Family Home Lives with:: Adult Children, Spouse Patient language and need for interpreter reviewed:: Yes Do you feel safe going back to the place where you live?: Yes      Need for Family  Participation in Patient Care: Yes (Comment) Care giver support system in place?: Yes (comment) Current home services: DME, Home PT, Home RN Criminal Activity/Legal Involvement Pertinent to Current Situation/Hospitalization: No - Comment as needed  Activities of Daily Living Home Assistive Devices/Equipment: Cane (specify quad or straight) ADL Screening (condition at time of admission) Patient's cognitive ability adequate to safely complete daily activities?: Yes Is the patient deaf or have difficulty hearing?: No Does the patient have difficulty seeing, even when wearing glasses/contacts?: No Does the patient have difficulty concentrating, remembering, or making decisions?: No Patient able to express need for assistance with ADLs?: Yes Does the patient have difficulty dressing or bathing?: No Independently performs ADLs?: Yes (appropriate for developmental age) Does the patient have difficulty walking or climbing stairs?: No Weakness of Legs: None Weakness of Arms/Hands: None  Permission Sought/Granted Permission sought to share information with : Facility Industrial/product designer granted to share information with : Yes, Verbal Permission Granted     Permission granted to share info w AGENCY: [facility]        Emotional Assessment Appearance:: Appears stated age Attitude/Demeanor/Rapport: Engaged, Gracious Affect (typically observed): Accepting, Appropriate, Calm, Pleasant Orientation: : Oriented to Self, Oriented to Place, Oriented to  Time, Oriented to Situation Alcohol / Substance Use: Not Applicable Psych Involvement: No (comment)  Admission diagnosis:  Diarrhea [R19.7] Generalized abdominal pain [R10.84] Diarrhea, unspecified type [R19.7] Nausea and vomiting, unspecified vomiting  type [R11.2] Patient Active Problem List   Diagnosis Date Noted   Diarrhea 02/27/2023   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) 02/27/2023   Elevated alkaline  phosphatase level 02/11/2023   SIRS (systemic inflammatory response syndrome) (HCC) 02/11/2023   Diarrhea of presumed infectious origin 02/11/2023   Colitis due to Campylobacter species 02/10/2023   Asymptomatic bacteriuria 02/10/2023   Difficulty walking 02/10/2023   Hypomagnesemia 01/08/2023   Hypokalemia 01/08/2023   CKD stage 3b, GFR 30-44 ml/min (HCC) 01/03/2023   Hypotension 01/02/2023   Pancolitis (HCC) 12/29/2022   History of Clostridioides difficile colitis 12/29/2022   Shock circulatory (HCC) 12/28/2022   Sepsis (HCC) 11/10/2022   Nausea vomiting and diarrhea 11/09/2022   Clostridium difficile diarrhea 11/09/2022   COVID-19 virus infection 11/09/2022   AKI (acute kidney injury) (HCC) 11/09/2022   Anemia 11/09/2022   Fungal infection of skin 09/23/2019   UTI (urinary tract infection) 07/15/2017   Dysuria 01/28/2017   Chronic radiation cystitis 02/28/2015   Coitalgia 02/28/2015   Blood pressure elevated 02/28/2015   Calculus of kidney 02/28/2015   Arthritis or polyarthritis, rheumatoid (HCC) 02/28/2015   Basal cell papilloma 02/28/2015   H/O cataract extraction 09/22/2014   Combined form of senile cataract 07/07/2014   NS (nuclear sclerosis) 05/21/2014   History of repair of hip joint 03/16/2013   H/O total knee replacement 03/16/2013   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 [place] [address] Phone: [phone] Fax: [phone]     Social Determinants of Health (SDOH) Social History: SDOH Screenings   Food Insecurity: No Food Insecurity (02/27/2023)  Housing: Low Risk  (02/27/2023)  Transportation Needs: No Transportation Needs (02/27/2023)  Utilities: Not At Risk (02/27/2023)  Alcohol Screen: Low Risk  (08/10/2020)  Depression (PHQ2-9): Low Risk  (08/10/2020)  Financial Resource Strain: Low Risk  (08/10/2020)  Physical Activity:  Inactive (08/10/2020)  Social Connections: Moderately Integrated (08/10/2020)  Stress: No Stress Concern Present (08/10/2020)  Tobacco Use: Medium Risk (02/27/2023)   SDOH Interventions:     Readmission Risk Interventions    03/01/2023   10:56 AM 01/02/2023   12:24 PM  Readmission Risk Prevention Plan  Transportation Screening Complete Complete  PCP or Specialist Appt within 3-5 Days Complete Complete  HRI or Home Care Consult Complete   Social Work Consult for Recovery Care Planning/Counseling Complete Complete  Palliative Care Screening Not Applicable Not Applicable  Medication Review Oceanographer) Complete Complete

## 2023-03-01 NOTE — Progress Notes (Signed)
Patient ID: Jeanne [name], female   DOB: 05-Nov-1950, 72 y.o.   MRN: [id number]  Subjective: The patient has no new complaints pertaining to her right hip.  She continues to have issues with her diarrhea, and is being managed for a UTI.  She did undergo the aspiration of the fluid collection in the subcutaneous tissues of her right hip by interventional radiology yesterday.   Objective: Vital signs in last 24 hours: Temp:  [97.8 F (36.6 C)-98.3 F (36.8 C)] 98.3 F (36.8 C) (06/21 1627) Pulse Rate:  [70-81] 81 (06/21 1627) Resp:  [17-18] 18 (06/21 1627) BP: (110-125)/(60-65) 114/61 (06/21 1627) SpO2:  [100 %] 100 % (06/21 1627)  Intake/Output from previous day: 06/20 0701 - 06/21 0700 In: 120 [P.O.:120] Out: -  Intake/Output this shift: No intake/output data recorded.  Recent Labs    02/27/23 0528  HGB 10.9*   Recent Labs    02/27/23 0528  WBC 14.7*  RBC 3.82*  HCT 34.8*  PLT 441*   Recent Labs    02/27/23 0528  NA 141  K 3.8  CL 113*  CO2 19*  BUN 27*  CREATININE 1.15*  GLUCOSE 116*  CALCIUM 8.1*   No results for input(s): "LABPT", "INR" in the last 72 hours.  Physical Exam: Orthopedic examination again is limited to the right hip and lower extremity.  Skin inspection around the right hip is notable for a well-healed surgical incision.  The mild swelling, erythema, and warmth appears to be somewhat improved as compared to examination 2 days ago.  She denies any tenderness to palpation over the anterior or lateral aspects of the hip, nor does she experience any pain with any passive or attempted active motion of the hip.  Neurovascular status again is unchanged as compared to her examination 2 days ago.  Assessment: Status post several surgical procedures for a septic right total hip arthroplasty now with a fluid collection in the subcutaneous tissues adjacent to the right total hip arthroplasty identified on CT scan.   Plan: The treatment options are  reviewed with the patient.  The preliminary results from the aspiration of the fluid collection in her hip from yesterday show only 88 white cells per high-powered field, of which 92% are lymphatic and only 5% are neutrophils.  These results are very encouraging for a lack of infection, although the final culture results are not yet available.  These results are shared with the patient with the caveat that the formal culture results are not yet back.  The patient will continue to be managed for her UTI and issues with diarrhea.  I will reassess the patient on Monday, assuming the culture results are back.  Meanwhile, the patient may continued to be mobilized with therapy as her symptoms and medical situation permit.   Excell Seltzer Mccartney Brucks 03/01/2023, 4:39 PM

## 2023-03-01 NOTE — Progress Notes (Signed)
PROGRESS NOTE    [name]  IHK:[id number] DOB: [birth year] DOA: 02/27/2023 PCP: Jerl Mina, MD     Brief Narrative:   From admission h and p  Jeanne [name] is a 72 y.o. female with past medical history of juvenile rheumatoid arthritis, hypertension, CKD, cervical cancer, C. Difficile who presented for recurrent diarrhea.  Patient noted to have been admitted June 2 through June 9 for recurrent diarrhea with noted diagnosis of Campylobacter status post course of azithromycin as well as completed course of C. difficile colitis.  Patient noted with multiple admissions for similar issues over the past 2 to 3 months.  Worsening diarrhea over the past 24 hours.  No fevers or chills.  Mild nausea.  Diarrhea nonbloody nonbilious.  Minimal abdominal pain.  Worsening generalized malaise.  Patient also with noted prior hip replacements as well as recent evaluation within the [facility] system for right hip infection.  Minimal right hip pain.  No reported trauma. Presented to the ER afebrile, blood pressures 90s to 110s over 50s to 60s.  White count 15, hemoglobin 11, urinalysis indicative of infection-though no dysuria, creatinine 1.15.  CT abdomen pelvis negative for colitis but does show some diarrhea also with right hip 9 x 2.2 cm seroma with?  Infection.  Assessment & Plan:   Principal Problem:   Diarrhea Active Problems:   Asymptomatic bacteriuria   CKD stage 3b, GFR 30-44 ml/min (HCC)   Dysuria   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)  # Diarrhea Subacute. Recently completed vanc taper for c diff, c diff negative this admission. Recent hospitalization campylobacter positive, treated with azithromycin, gi pathogen panel negative. No concerning findings on CT. GI consulted - stool culture and o & p ordered, results pending - repeat course of azithromycin advised by GI and is ordered - cont loperamide, appears to be helping  # Right hip fluid collection # Hx multiple surgeries  right hip Seroma, possible infection. Ortho consulted, advises IR aspiration with culture. Followed by [facility]. History infection in right hip in 2023. IR aspiration performed 6/20, serous fluid, appears this is a seroma - monitor culture, ngtd  # Bacteriuria # Hx recurrent uti Patient initially says no dysuria or other uti symptoms then says perhaps mild pain. Urinalysis consistent w/ bacteriuria.  - will monitor symptoms, treat if clear symptoms uti - f/u culture, growing klebsiella  # History Juvenile RA Bedbound at baseline, desires return to home  # Chronic pain - home oxy, gabapentin  # CKD 3a Kidney function is at baseline    DVT prophylaxis: lovenox Code Status: full Family Communication: daughter present at bedside 6/20  Level of care: Med-Surg     Consultants:  Ir, gi, ortho  Procedures: IR aspiration right hip  Antimicrobials:  azithromycin    Subjective: No right hip pain, diarrhea improving  Objective: Vitals:   02/28/23 1528 02/28/23 1949 03/01/23 0336 03/01/23 0846  BP: 129/67 114/62 125/60 110/65  Pulse: 83 75 77 70  Resp: 19 17 18 18   Temp: 98.1 F (36.7 C) 98.1 F (36.7 C) 98.1 F (36.7 C) 97.8 F (36.6 C)  TempSrc: Oral  Oral Oral  SpO2: 100% 100% 100% 100%  Weight:      Height:        Intake/Output Summary (Last 24 hours) at 03/01/2023 1412 Last data filed at 02/28/2023 1913 Gross per 24 hour  Intake 120 ml  Output --  Net 120 ml   Filed Weights   02/27/23 0758  02/27/23 1506  Weight: 58.1 kg 65.8 kg    Examination:  General exam: Appears calm and comfortable  Respiratory system: Clear to auscultation. Respiratory effort normal. Cardiovascular system: S1 & S2 heard, RRR. No JVD, murmurs, rubs, gallops or clicks. No pedal edema. Gastrointestinal system: Abdomen is nondistended, soft and nontender. No organomegaly or masses felt. Normal bowel sounds heard. Central nervous system: Alert and oriented. No focal neurological  deficits. Extremities: Symmetric 5 x 5 power. Skin: some induration right hip incision scar Psychiatry: Judgement and insight appear normal. Mood & affect appropriate.     Data Reviewed: I have personally reviewed following labs and imaging studies  CBC: Recent Labs  Lab 02/27/23 0528  WBC 14.7*  NEUTROABS 13.7*  HGB 10.9*  HCT 34.8*  MCV 91.1  PLT 441*   Basic Metabolic Panel: Recent Labs  Lab 02/27/23 0528  NA 141  K 3.8  CL 113*  CO2 19*  GLUCOSE 116*  BUN 27*  CREATININE 1.15*  CALCIUM 8.1*   GFR: Estimated Creatinine Clearance: 42.5 mL/min (A) (by C-G formula based on SCr of 1.15 mg/dL (H)). Liver Function Tests: Recent Labs  Lab 02/27/23 0528  AST 26  ALT 40  ALKPHOS 276*  BILITOT 0.5  PROT 6.1*  ALBUMIN 2.4*   Recent Labs  Lab 02/27/23 0528  LIPASE 24   No results for input(s): "AMMONIA" in the last 168 hours. Coagulation Profile: No results for input(s): "INR", "PROTIME" in the last 168 hours. Cardiac Enzymes: No results for input(s): "CKTOTAL", "CKMB", "CKMBINDEX", "TROPONINI" in the last 168 hours. BNP (last 3 results) No results for input(s): "PROBNP" in the last 8760 hours. HbA1C: No results for input(s): "HGBA1C" in the last 72 hours. CBG: No results for input(s): "GLUCAP" in the last 168 hours. Lipid Profile: No results for input(s): "CHOL", "HDL", "LDLCALC", "TRIG", "CHOLHDL", "LDLDIRECT" in the last 72 hours. Thyroid Function Tests: No results for input(s): "TSH", "T4TOTAL", "FREET4", "T3FREE", "THYROIDAB" in the last 72 hours. Anemia Panel: No results for input(s): "VITAMINB12", "FOLATE", "FERRITIN", "TIBC", "IRON", "RETICCTPCT" in the last 72 hours. Urine analysis:    Component Value Date/Time   COLORURINE YELLOW (A) 02/27/2023 1016   APPEARANCEUR TURBID (A) 02/27/2023 1016   APPEARANCEUR Cloudy (A) 10/21/2018 0841   LABSPEC 1.032 (H) 02/27/2023 1016   PHURINE 6.0 02/27/2023 1016   GLUCOSEU NEGATIVE 02/27/2023 1016   HGBUR  NEGATIVE 02/27/2023 1016   BILIRUBINUR NEGATIVE 02/27/2023 1016   BILIRUBINUR negative 07/15/2020 0827   BILIRUBINUR Negative 10/21/2018 0841   KETONESUR NEGATIVE 02/27/2023 1016   PROTEINUR 100 (A) 02/27/2023 1016   UROBILINOGEN 0.2 07/15/2020 0827   NITRITE POSITIVE (A) 02/27/2023 1016   LEUKOCYTESUR LARGE (A) 02/27/2023 1016   Sepsis Labs: @LABRCNTIP (procalcitonin:4,lacticidven:4)  ) Recent Results (from the past 240 hour(s))  C Difficile Quick Screen w PCR reflex     Status: None   Collection Time: 02/27/23  5:28 AM   Specimen: STOOL  Result Value Ref Range Status   C Diff antigen NEGATIVE NEGATIVE Final   C Diff toxin NEGATIVE NEGATIVE Final   C Diff interpretation No C. difficile detected.  Final    Comment: Performed at [facility], [address] [place], [place] [address]  Gastrointestinal Panel by PCR , Stool     Status: None   Collection Time: 02/27/23  5:28 AM   Specimen: STOOL  Result Value Ref Range Status   Campylobacter species NOT DETECTED NOT DETECTED Final   Plesimonas shigelloides NOT DETECTED NOT DETECTED Final  Salmonella species NOT DETECTED NOT DETECTED Final   Yersinia enterocolitica NOT DETECTED NOT DETECTED Final   Vibrio species NOT DETECTED NOT DETECTED Final   Vibrio cholerae NOT DETECTED NOT DETECTED Final   Enteroaggregative E coli (EAEC) NOT DETECTED NOT DETECTED Final   Enteropathogenic E coli (EPEC) NOT DETECTED NOT DETECTED Final   Enterotoxigenic E coli (ETEC) NOT DETECTED NOT DETECTED Final   Shiga like toxin producing E coli (STEC) NOT DETECTED NOT DETECTED Final   Shigella/Enteroinvasive E coli (EIEC) NOT DETECTED NOT DETECTED Final   Cryptosporidium NOT DETECTED NOT DETECTED Final   Cyclospora cayetanensis NOT DETECTED NOT DETECTED Final   Entamoeba histolytica NOT DETECTED NOT DETECTED Final   Giardia lamblia NOT DETECTED NOT DETECTED Final   Adenovirus F40/41 NOT DETECTED NOT DETECTED Final   Astrovirus NOT DETECTED  NOT DETECTED Final   Norovirus GI/GII NOT DETECTED NOT DETECTED Final   Rotavirus A NOT DETECTED NOT DETECTED Final   Sapovirus (I, II, IV, and V) NOT DETECTED NOT DETECTED Final    Comment: Performed at [facility], [address] [place], [place] [address]  Urine Culture (for pregnant, neutropenic or urologic patients or patients with an indwelling urinary catheter)     Status: Abnormal (Preliminary result)   Collection Time: 02/27/23 10:16 AM   Specimen: Urine, Clean Catch  Result Value Ref Range Status   Specimen Description   Final    URINE, CLEAN CATCH Performed at [facility], [address], [place], [place] [address]    Special Requests   Final    NONE Performed at [facility], [address] [place], [place] [address]    Culture 80,000 COLONIES/mL KLEBSIELLA OXYTOCA (A)  Final   Report Status PENDING  Incomplete  Aerobic/Anaerobic Culture w Gram Stain (surgical/deep wound)     Status: None (Preliminary result)   Collection Time: 02/28/23  3:34 PM   Specimen: Synovium; Body Fluid  Result Value Ref Range Status   Specimen Description   Final    SYNOVIAL Performed at [facility], [address] [place], [place] [address]    Special Requests   Final    NONE Performed at [facility], [address] [place], [place] [address]    Gram Stain   Final    CYTOSPIN SMEAR WBC PRESENT, PREDOMINANTLY MONONUCLEAR NO ORGANISMS SEEN    Culture   Final    NO GROWTH < 12 HOURS Performed at [facility], [address], [place], [place] [address]    Report Status PENDING  Incomplete         Radiology Studies: [place] FNA SOFT TISSUE  Result Date: 02/28/2023 INDICATION: Subcutaneous fluid collection around left hip EXAM: Ultrasound-guided aspiration of subcutaneous fluid collection surrounding the left hip MEDICATIONS: None. ANESTHESIA/SEDATION: Local analgesia COMPLICATIONS: None immediate. PROCEDURE: Informed written  consent was obtained from the patient after a thorough discussion of the procedural risks, benefits and alternatives. All questions were addressed. Maximal Sterile Barrier Technique was utilized including caps, mask, sterile gowns, sterile gloves, sterile drape, hand hygiene and skin antiseptic. A timeout was performed prior to the initiation of the procedure. The patient was placed supine on the exam table. Ultrasound of the left hip superficial tissues demonstrated hypoechoic fluid collection. Skin entry site was marked, and the overlying skin was prepped draped in the standard sterile fashion. Local analgesia was obtained with 1% lidocaine. Using ultrasound guidance, a 19 gauge Yueh catheter was advanced into the identified fluid  collection. Subsequently, approximately 5 mL of thin, straw-colored fluid was aspirated. A sample was sent to the lab for microbiology analysis. A clean dressing was placed. The patient tolerated the procedure well without immediate complication. IMPRESSION: Successful ultrasound-guided aspiration/sampling of superficial collection adjacent to the left hip, yielding 5 mL of thin serous fluid, most compatible with a seroma. Sample was sent for microbiology analysis. Electronically Signed   By: Olive Bass M.D.   On: 02/28/2023 15:47        Scheduled Meds:  azithromycin  500 mg Oral Daily   enoxaparin (LOVENOX) injection  40 mg Subcutaneous Q24H   gabapentin  100 mg Oral QHS   Continuous Infusions:   LOS: 1 day     Silvano Bilis, MD [facility]   If 7PM-7AM, please contact night-coverage [email or web address] Password [email or web address] 03/01/2023, 2:12 PM

## 2023-03-02 DIAGNOSIS — I959 Hypotension, unspecified: Secondary | ICD-10-CM | POA: Diagnosis not present

## 2023-03-02 DIAGNOSIS — Z743 Need for continuous supervision: Secondary | ICD-10-CM | POA: Diagnosis not present

## 2023-03-02 LAB — URINE CULTURE: Culture: 80000 — AB

## 2023-03-02 LAB — CBC
HCT: 32.5 % — ABNORMAL LOW (ref 36.0–46.0)
Hemoglobin: 9.2 g/dL — ABNORMAL LOW (ref 12.0–15.0)
MCH: 27.6 pg (ref 26.0–34.0)
MCHC: 28.3 g/dL — ABNORMAL LOW (ref 30.0–36.0)
MCV: 97.6 fL (ref 80.0–100.0)
Platelets: 295 10*3/uL (ref 150–400)
RBC: 3.33 MIL/uL — ABNORMAL LOW (ref 3.87–5.11)
RDW: 16.2 % — ABNORMAL HIGH (ref 11.5–15.5)
WBC: 6.3 10*3/uL (ref 4.0–10.5)
nRBC: 0 % (ref 0.0–0.2)

## 2023-03-02 LAB — BASIC METABOLIC PANEL
Anion gap: 7 (ref 5–15)
BUN: 17 mg/dL (ref 8–23)
CO2: 20 mmol/L — ABNORMAL LOW (ref 22–32)
Calcium: 8.4 mg/dL — ABNORMAL LOW (ref 8.9–10.3)
Chloride: 113 mmol/L — ABNORMAL HIGH (ref 98–111)
Creatinine, Ser: 1.23 mg/dL — ABNORMAL HIGH (ref 0.44–1.00)
GFR, Estimated: 47 mL/min — ABNORMAL LOW (ref >60)
Glucose, Bld: 94 mg/dL (ref 70–99)
Potassium: 3.5 mmol/L (ref 3.5–5.1)
Sodium: 140 mmol/L (ref 135–145)

## 2023-03-02 LAB — AEROBIC/ANAEROBIC CULTURE W GRAM STAIN (SURGICAL/DEEP WOUND)

## 2023-03-02 MED ORDER — LOPERAMIDE HCL 2 MG PO CAPS: 2.0000 mg | ORAL_CAPSULE | ORAL | 0 refills | Status: DC | PRN

## 2023-03-02 NOTE — Discharge Summary (Signed)
Jeanne [name] QIO:[id number] DOB: 06-04-1951 DOA: 02/27/2023  PCP: Jerl Mina, MD  Admit date: 02/27/2023 Discharge date: 03/02/2023  Time spent: 35 minutes  Recommendations for Outpatient Follow-up:  Pcp and GI f/u  F/u final results of stool culture, O & P, and culture of right hip seroma    Discharge Diagnoses:  Principal Problem:   Diarrhea Active Problems:   Asymptomatic bacteriuria   CKD stage 3b, GFR 30-44 ml/min (HCC)   Dysuria   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)   Discharge Condition: stable  Diet recommendation: regular  Filed Weights   02/27/23 0758 02/27/23 1506  Weight: 58.1 kg 65.8 kg    History of present illness:  From admission h and p Jeanne [name] is a 72 y.o. female with medical history significant of juvenile rheumatoid arthritis, hypertension, CKD, cervical cancer, C. Difficile who presented for recurrent diarrhea.  Patient noted to have been admitted June 2 through June 9 for recurrent diarrhea with noted diagnosis of Campylobacter status post course of azithromycin as well as completed course of C. difficile colitis.  Patient noted with multiple admissions for similar issues over the past 2 to 3 months.  Worsening diarrhea over the past 24 hours.  No fevers or chills.  Mild nausea.  Diarrhea nonbloody nonbilious.  Minimal abdominal pain.  Worsening generalized malaise.  Patient also with noted prior hip replacements as well as recent evaluation within the [facility] system for right hip infection.  Minimal right hip pain.  No reported trauma.   Hospital Course:   # Diarrhea Subacute. Recently completed vanc taper for c diff, c diff negative this admission. Recent hospitalization campylobacter positive, treated with azithromycin, gi pathogen panel negative. No concerning findings on CT. GI consulted this admission, advised empiric course of azithromycin which she has finished. That and/or loperamide has improved diarrhea. GI has signed off -  stool culture and o & p ordered, results pending, will follow those - GI f/u - continue loperamide prn for now   # Right hip fluid collection # Hx multiple surgeries right hip Seroma, possible infection. Ortho consulted, advises IR aspiration with culture. Followed by [facility]. History infection in right hip in 2023. IR aspiration performed 6/20, serous fluid, fluid analysis consistent w/ seroma - monitor culture, ngtd   # Bacteriuria # Hx recurrent uti Ua growing klebsiella but patient denies symptoms so we have elected not to treat   # History Juvenile RA Bedbound at baseline, desires return to home   # Chronic pain - home oxy, gabapentin   # CKD 3a Kidney function is at baseline  Procedures: none   Consultations: GI, ortho  Discharge Exam: Vitals:   03/02/23 0455 03/02/23 0839  BP: 122/61 (!) 109/58  Pulse: 77 85  Resp: 18 15  Temp: 98.1 F (36.7 C) 98.8 F (37.1 C)  SpO2: 100% 100%    General exam: Appears calm and comfortable  Respiratory system: Clear to auscultation. Respiratory effort normal. Cardiovascular system: S1 & S2 heard, RRR. No JVD, murmurs, rubs, gallops or clicks. No pedal edema. Gastrointestinal system: Abdomen is nondistended, soft and nontender. No organomegaly or masses felt. Normal bowel sounds heard. Central nervous system: Alert and oriented. No focal neurological deficits. Extremities: Symmetric 5 x 5 power. Skin: some induration right hip incision scar Psychiatry: Judgement and insight appear normal. Mood & affect appropriate.   Discharge Instructions   Discharge Instructions     Diet - low sodium heart healthy   Complete by: As  directed    Increase activity slowly   Complete by: As directed       Allergies as of 03/02/2023       Reactions   Codeine Nausea Only, Nausea And Vomiting   Augmentin [amoxicillin-pot Clavulanate] Nausea Only   Very nauseated/ feels sore on body         Medication List     STOP taking  these medications    diclofenac 75 MG EC tablet Commonly known as: VOLTAREN       TAKE these medications    cyanocobalamin 1000 MCG tablet Take 1 tablet (1,000 mcg total) by mouth daily.   gabapentin 100 MG capsule Commonly known as: NEURONTIN Take 100 mg by mouth at bedtime.   lidocaine-prilocaine cream Commonly known as: EMLA APPLY SPARINGLY 2-3 TIMES A DAY TO AREA OF RADIATION THERAPY IRRITATION.   loperamide 2 MG capsule Commonly known as: IMODIUM Take 1 capsule (2 mg total) by mouth every 4 (four) hours as needed for diarrhea or loose stools.   multivitamin with minerals Tabs tablet Take 1 tablet by mouth daily.   oxyCODONE 5 MG immediate release tablet Commonly known as: Oxy IR/ROXICODONE Take 1 tablet (5 mg total) by mouth every 6 (six) hours as needed.   promethazine 25 MG tablet Commonly known as: PHENERGAN Take 0.5 tablets (12.5 mg total) by mouth every 6 (six) hours as needed for up to 14 days for nausea or vomiting.   saccharomyces boulardii 250 MG capsule Commonly known as: FLORASTOR Take 1 capsule (250 mg total) by mouth 2 (two) times daily for 28 days.   tretinoin 0.025 % cream Commonly known as: RETIN-A Apply topically at bedtime.   Vitamin D (Ergocalciferol) 1.25 MG (50000 UNIT) Caps capsule Commonly known as: DRISDOL Take 1 capsule (50,000 Units total) by mouth every 7 (seven) days.       Allergies  Allergen Reactions   Codeine Nausea Only and Nausea And Vomiting   Augmentin [Amoxicillin-Pot Clavulanate] Nausea Only    Very nauseated/ feels sore on body     Follow-up Information     Jerl Mina, MD Follow up.   Specialty: Family Medicine Contact information: [address] [facility] [place] [address] [phone]         Stanton Kidney, MD Follow up.   Specialty: Gastroenterology Contact information: [address] [place] [address] [phone]                  The results of  significant diagnostics from this hospitalization (including imaging, microbiology, ancillary and laboratory) are listed below for reference.    Significant Diagnostic Studies: [place] FNA SOFT TISSUE  Result Date: 02/28/2023 INDICATION: Subcutaneous fluid collection around left hip EXAM: Ultrasound-guided aspiration of subcutaneous fluid collection surrounding the left hip MEDICATIONS: None. ANESTHESIA/SEDATION: Local analgesia COMPLICATIONS: None immediate. PROCEDURE: Informed written consent was obtained from the patient after a thorough discussion of the procedural risks, benefits and alternatives. All questions were addressed. Maximal Sterile Barrier Technique was utilized including caps, mask, sterile gowns, sterile gloves, sterile drape, hand hygiene and skin antiseptic. A timeout was performed prior to the initiation of the procedure. The patient was placed supine on the exam table. Ultrasound of the left hip superficial tissues demonstrated hypoechoic fluid collection. Skin entry site was marked, and the overlying skin was prepped draped in the standard sterile fashion. Local analgesia was obtained with 1% lidocaine. Using ultrasound guidance, a 19 gauge Yueh catheter was advanced into the identified  fluid collection. Subsequently, approximately 5 mL of thin, straw-colored fluid was aspirated. A sample was sent to the lab for microbiology analysis. A clean dressing was placed. The patient tolerated the procedure well without immediate complication. IMPRESSION: Successful ultrasound-guided aspiration/sampling of superficial collection adjacent to the left hip, yielding 5 mL of thin serous fluid, most compatible with a seroma. Sample was sent for microbiology analysis. Electronically Signed   By: Olive Bass M.D.   On: 02/28/2023 15:47   CT ABDOMEN PELVIS W CONTRAST  Result Date: 02/27/2023 CLINICAL DATA:  Abdominal pain EXAM: CT ABDOMEN AND PELVIS WITH CONTRAST TECHNIQUE: Multidetector CT imaging of  the abdomen and pelvis was performed using the standard protocol following bolus administration of intravenous contrast. RADIATION DOSE REDUCTION: This exam was performed according to the departmental dose-optimization program which includes automated exposure control, adjustment of the mA and/or kV according to patient size and/or use of iterative reconstruction technique. CONTRAST:  OMNIPAQUE IOHEXOL 300 MG/ML  SOLN COMPARISON:  12/27/2022 FINDINGS: Lower chest: No acute abnormality. Hepatobiliary: No focal liver abnormality is seen. Cholelithiasis. No biliary ductal dilatation. Pancreas: Unremarkable. No pancreatic ductal dilatation or surrounding inflammatory changes. Spleen: Normal in size without focal abnormality. Adrenals/Urinary Tract: Adrenal glands are unremarkable. Multiple right parapelvic cysts. No right obstructive uropathy. Severe left renal atrophy. Moderate chronic left hydroureteronephrosis. Normal bladder. Stomach/Bowel: Stomach is within normal limits. No evidence of bowel wall thickening, distention, or inflammatory changes. Small amount of fluid throughout the colon as can be seen with diarrhea. Vascular/Lymphatic: Aortic atherosclerosis. No enlarged abdominal or pelvic lymph nodes. Reproductive: Uterus and bilateral adnexa are unremarkable. Other: No abdominal wall hernia or abnormality. No abdominopelvic ascites. Mild anasarca. Musculoskeletal: No acute osseous abnormality. No aggressive osseous lesion. Generalized osteopenia. Chronic T12, L1 and L5 vertebral bodies. Bilateral total hip arthroplasties with susceptibility artifact resulting from the orthopedic hardware partially obscuring the adjacent soft tissue and osseous structures. Ankylosis of bilateral SI joints. Fluid collection in the subcutaneous fat overlying the right hip along the track of the surgical site measuring 8.9 x 2.2 cm likely reflecting a postoperative seroma. Secondarily infected postoperative seroma cannot be  excluded. IMPRESSION: 1. No acute abdominal or pelvic pathology. 2. Small amount of fluid throughout the colon as can be seen with diarrhea. 3. Cholelithiasis. 4. Severe left renal atrophy. Moderate chronic left hydroureteronephrosis. 5. Fluid collection in the subcutaneous fat overlying the right hip along the track of the surgical site measuring 8.9 x 2.2 cm likely reflecting a postoperative seroma. Secondarily infected postoperative seroma cannot be excluded. 6.  Aortic Atherosclerosis (ICD10-I70.0). Electronically Signed   By: Elige Ko M.D.   On: 02/27/2023 08:31   CT ABDOMEN PELVIS W CONTRAST  Result Date: 02/10/2023 CLINICAL DATA:  Nausea vomiting and diarrhea. EXAM: CT ABDOMEN AND PELVIS WITH CONTRAST TECHNIQUE: Multidetector CT imaging of the abdomen and pelvis was performed using the standard protocol following bolus administration of intravenous contrast. RADIATION DOSE REDUCTION: This exam was performed according to the departmental dose-optimization program which includes automated exposure control, adjustment of the mA and/or kV according to patient size and/or use of iterative reconstruction technique. CONTRAST:  80mL OMNIPAQUE IOHEXOL 300 MG/ML  SOLN COMPARISON:  CT abdomen and pelvis 12/28/2022 FINDINGS: Lower chest: No acute abnormality. Hepatobiliary: Gallbladder sludge or layering stones present. No biliary ductal dilatation. There is diffuse fatty infiltration of the liver, unchanged. Pancreas: Unremarkable. No pancreatic ductal dilatation or surrounding inflammatory changes. Spleen: Borderline enlarged, unchanged. Adrenals/Urinary Tract: The bladder is not well seen secondary to  streak artifact in the pelvis. Chronic left renal cortical thinning and atrophy again noted. Chronic appearing moderate left hydroureteronephrosis is also unchanged. Small right peripelvic cysts and cortical cysts are again noted. Right renal cortical scarring is stable. There are punctate nonobstructing right  renal calculi. The adrenal glands are within normal limits. Stomach/Bowel: Evaluation of bowel loops in the pelvis limited secondary to streak artifact. There is no bowel obstruction, pneumatosis or free air. There is mild wall thickening and submucosal enhancement of the distal descending colon compatible with colitis. Rectal tube in place. Scattered air-fluid levels are seen throughout the colon. The appendix is not visualized. Small bowel and stomach are within normal limits. Vascular/Lymphatic: No significant vascular findings are present. No enlarged abdominal or pelvic lymph nodes. There surgical clips in the bilateral iliac chain regions. Reproductive: Not well evaluated secondary to streak artifact in the pelvis. Other: No abdominal wall hernia or abnormality. No abdominopelvic ascites. Musculoskeletal: Bilateral hip arthroplasties are again noted. The bones are osteopenic. Compression deformities of T12, L1 and L5 appear stable. IMPRESSION: 1. Mild wall thickening and submucosal enhancement of the distal descending colon compatible with colitis. 2. Scattered air-fluid levels throughout the colon compatible with diarrheal disease. 3. Stable chronic left hydroureteronephrosis. 4. Nonobstructing right renal calculi. 5. Cholelithiasis. 6. Fatty infiltration of the liver. Electronically Signed   By: Darliss Cheney M.D.   On: 02/10/2023 17:15    Microbiology: Recent Results (from the past 240 hour(s))  C Difficile Quick Screen w PCR reflex     Status: None   Collection Time: 02/27/23  5:28 AM   Specimen: STOOL  Result Value Ref Range Status   C Diff antigen NEGATIVE NEGATIVE Final   C Diff toxin NEGATIVE NEGATIVE Final   C Diff interpretation No C. difficile detected.  Final    Comment: Performed at [facility], [address] [place], [place] [address]  Gastrointestinal Panel by PCR , Stool     Status: None   Collection Time: 02/27/23  5:28 AM   Specimen: STOOL  Result Value Ref Range  Status   Campylobacter species NOT DETECTED NOT DETECTED Final   Plesimonas shigelloides NOT DETECTED NOT DETECTED Final   Salmonella species NOT DETECTED NOT DETECTED Final   Yersinia enterocolitica NOT DETECTED NOT DETECTED Final   Vibrio species NOT DETECTED NOT DETECTED Final   Vibrio cholerae NOT DETECTED NOT DETECTED Final   Enteroaggregative E coli (EAEC) NOT DETECTED NOT DETECTED Final   Enteropathogenic E coli (EPEC) NOT DETECTED NOT DETECTED Final   Enterotoxigenic E coli (ETEC) NOT DETECTED NOT DETECTED Final   Shiga like toxin producing E coli (STEC) NOT DETECTED NOT DETECTED Final   Shigella/Enteroinvasive E coli (EIEC) NOT DETECTED NOT DETECTED Final   Cryptosporidium NOT DETECTED NOT DETECTED Final   Cyclospora cayetanensis NOT DETECTED NOT DETECTED Final   Entamoeba histolytica NOT DETECTED NOT DETECTED Final   Giardia lamblia NOT DETECTED NOT DETECTED Final   Adenovirus F40/41 NOT DETECTED NOT DETECTED Final   Astrovirus NOT DETECTED NOT DETECTED Final   Norovirus GI/GII NOT DETECTED NOT DETECTED Final   Rotavirus A NOT DETECTED NOT DETECTED Final   Sapovirus (I, II, IV, and V) NOT DETECTED NOT DETECTED Final    Comment: Performed at [facility], [address] [place], [place] [address]  Stool culture     Status: None (Preliminary result)   Collection Time: 02/27/23  5:28 AM   Specimen: Stool  Result Value Ref Range Status   Salmonella/Shigella  Screen PENDING  Incomplete   Campylobacter Culture PENDING  Incomplete   E coli, Shiga toxin Assay Negative Negative Final    Comment: (NOTE) Performed At: [facility] [address] [place], [place] [id number] Jolene Schimke MD ZH:[id number]   Urine Culture (for pregnant, neutropenic or urologic patients or patients with an indwelling urinary catheter)     Status: Abnormal (Preliminary result)   Collection Time: 02/27/23 10:16 AM   Specimen: Urine, Clean Catch  Result Value Ref Range Status    Specimen Description   Final    URINE, CLEAN CATCH Performed at [facility], [address] [place], [place] [address]    Special Requests   Final    NONE Performed at [facility], [address], [place], [place] [address]    Culture (A)  Final    80,000 COLONIES/mL KLEBSIELLA OXYTOCA SUSCEPTIBILITIES TO FOLLOW Performed at [facility], [address] [place], [place] [address]    Report Status PENDING  Incomplete  Aerobic/Anaerobic Culture w Gram Stain (surgical/deep wound)     Status: None (Preliminary result)   Collection Time: 02/28/23  3:34 PM   Specimen: Synovium; Body Fluid  Result Value Ref Range Status   Specimen Description   Final    SYNOVIAL Performed at [facility], [address], [place], [place] [address]    Special Requests   Final    NONE Performed at [facility], [address] [place], [place] [address]    Gram Stain   Final    CYTOSPIN SMEAR WBC PRESENT, PREDOMINANTLY MONONUCLEAR NO ORGANISMS SEEN    Culture   Final    NO GROWTH < 12 HOURS Performed at [facility], [address] [place], [place] [address]    Report Status PENDING  Incomplete     Labs: Basic Metabolic Panel: Recent Labs  Lab 02/27/23 0528 03/02/23 0453  NA 141 140  K 3.8 3.5  CL 113* 113*  CO2 19* 20*  GLUCOSE 116* 94  BUN 27* 17  CREATININE 1.15* 1.23*  CALCIUM 8.1* 8.4*   Liver Function Tests: Recent Labs  Lab 02/27/23 0528  AST 26  ALT 40  ALKPHOS 276*  BILITOT 0.5  PROT 6.1*  ALBUMIN 2.4*   Recent Labs  Lab 02/27/23 0528  LIPASE 24   No results for input(s): "AMMONIA" in the last 168 hours. CBC: Recent Labs  Lab 02/27/23 0528 03/02/23 0453  WBC 14.7* 6.3  NEUTROABS 13.7*  --   HGB 10.9* 9.2*  HCT 34.8* 32.5*  MCV 91.1 97.6  PLT 441* 295   Cardiac Enzymes: No results for input(s): "CKTOTAL", "CKMB", "CKMBINDEX", "TROPONINI" in the last 168 hours. BNP: BNP (last 3 results) No results  for input(s): "BNP" in the last 8760 hours.  ProBNP (last 3 results) No results for input(s): "PROBNP" in the last 8760 hours.  CBG: No results for input(s): "GLUCAP" in the last 168 hours.     Signed:  Silvano Bilis MD.  [facility] 03/02/2023, 10:47 AM

## 2023-03-02 NOTE — TOC Transition Note (Addendum)
Transition of Care [facility]) - CM/SW Discharge Note   Patient Details  Name: Jeanne [name] MRN: [id number] Date of Birth: 1951-05-26  [facility] CM/SW Contact:  Kemper Durie, RN Phone Number: 03/02/2023, 11:20 AM   Clinical Narrative:     Notified that patient will be discharged today, will need EMS transportation.  Spoke with patient, aware of discharge, denies having equipment at the bedside.  Confirmed address, daughter at bedside and confirms that husband will be at home waiting for patient to arrive.  EMS paperwork completed and printed to floor along with Facesheet.  Will call EMS when bedside nurse ready for discharge.    Update 1149:  Non emergent EMS called for transport, bedside nurse notified that patient is next on list.   Final next level of care: Home w Home Health Services Barriers to Discharge: Barriers Resolved   Patient Goals and CMS Choice   Choice offered to / list presented to : Patient  Discharge Placement                    Name of family member notified: Hendricks Milo, Daughter Patient and family notified of of transfer: 03/02/23  Discharge Plan and Services Additional resources added to the After Visit Summary for       Post Acute Care Choice: Resumption of Svcs/PTA Provider                    HH Arranged: RN, PT [facility] Agency: [facility] Date [facility] Agency Contacted: 03/02/23 Time HH Agency Contacted: 1119 Representative spoke with at [facility] Agency: Laurelyn Sickle  Social Determinants of Health (SDOH) Interventions SDOH Screenings   Food Insecurity: No Food Insecurity (02/27/2023)  Housing: Low Risk  (02/27/2023)  Transportation Needs: No Transportation Needs (02/27/2023)  Utilities: Not At Risk (02/27/2023)  Alcohol Screen: Low Risk  (08/10/2020)  Depression (PHQ2-9): Low Risk  (08/10/2020)  Financial Resource Strain: Low Risk  (08/10/2020)  Physical Activity: Inactive (08/10/2020)  Social Connections: Moderately Integrated  (08/10/2020)  Stress: No Stress Concern Present (08/10/2020)  Tobacco Use: Medium Risk (02/27/2023)     Readmission Risk Interventions    03/01/2023   10:56 AM 01/02/2023   12:24 PM  Readmission Risk Prevention Plan  Transportation Screening Complete Complete  PCP or Specialist Appt within 3-5 Days Complete Complete  HRI or Home Care Consult Complete   Social Work Consult for Recovery Care Planning/Counseling Complete Complete  Palliative Care Screening Not Applicable Not Applicable  Medication Review Oceanographer) Complete Complete

## 2023-03-03 LAB — URINE CULTURE

## 2023-03-04 ENCOUNTER — Ambulatory Visit: Payer: Self-pay | Admitting: *Deleted

## 2023-03-04 LAB — STOOL CULTURE: E coli, Shiga toxin Assay: NEGATIVE

## 2023-03-04 LAB — AEROBIC/ANAEROBIC CULTURE W GRAM STAIN (SURGICAL/DEEP WOUND)

## 2023-03-04 LAB — STOOL CULTURE REFLEX - CMPCXR

## 2023-03-04 LAB — STOOL CULTURE REFLEX - RSASHR

## 2023-03-04 NOTE — Patient Outreach (Signed)
  Care Coordination   03/04/2023 Name: KENORA SPAYD MRN: [id number] DOB: 1950/10/04   Care Coordination Outreach Attempts:  An unsuccessful telephone outreach was attempted for a scheduled appointment today.  Follow Up Plan:  Additional outreach attempts will be made to offer the patient care coordination information and services.   Encounter Outcome:  No Answer   Care Coordination Interventions:  No, not indicated    Kemper Durie, RN, MSN, [facility] Care Management Care Management Coordinator [phone]

## 2023-03-05 DIAGNOSIS — Z96641 Presence of right artificial hip joint: Secondary | ICD-10-CM | POA: Diagnosis not present

## 2023-03-05 DIAGNOSIS — Z8616 Personal history of COVID-19: Secondary | ICD-10-CM | POA: Diagnosis not present

## 2023-03-05 DIAGNOSIS — I959 Hypotension, unspecified: Secondary | ICD-10-CM | POA: Diagnosis not present

## 2023-03-05 DIAGNOSIS — R944 Abnormal results of kidney function studies: Secondary | ICD-10-CM | POA: Diagnosis not present

## 2023-03-05 DIAGNOSIS — I130 Hypertensive heart and chronic kidney disease with heart failure and stage 1 through stage 4 chronic kidney disease, or unspecified chronic kidney disease: Secondary | ICD-10-CM | POA: Diagnosis not present

## 2023-03-05 DIAGNOSIS — E876 Hypokalemia: Secondary | ICD-10-CM | POA: Diagnosis not present

## 2023-03-05 DIAGNOSIS — L299 Pruritus, unspecified: Secondary | ICD-10-CM | POA: Diagnosis not present

## 2023-03-05 DIAGNOSIS — I5031 Acute diastolic (congestive) heart failure: Secondary | ICD-10-CM | POA: Diagnosis not present

## 2023-03-05 DIAGNOSIS — E538 Deficiency of other specified B group vitamins: Secondary | ICD-10-CM | POA: Diagnosis not present

## 2023-03-05 DIAGNOSIS — Z8541 Personal history of malignant neoplasm of cervix uteri: Secondary | ICD-10-CM | POA: Diagnosis not present

## 2023-03-05 DIAGNOSIS — I2510 Atherosclerotic heart disease of native coronary artery without angina pectoris: Secondary | ICD-10-CM | POA: Diagnosis not present

## 2023-03-05 DIAGNOSIS — N1832 Chronic kidney disease, stage 3b: Secondary | ICD-10-CM | POA: Diagnosis not present

## 2023-03-05 DIAGNOSIS — D631 Anemia in chronic kidney disease: Secondary | ICD-10-CM | POA: Diagnosis not present

## 2023-03-05 DIAGNOSIS — E559 Vitamin D deficiency, unspecified: Secondary | ICD-10-CM | POA: Diagnosis not present

## 2023-03-05 DIAGNOSIS — M0800 Unspecified juvenile rheumatoid arthritis of unspecified site: Secondary | ICD-10-CM | POA: Diagnosis not present

## 2023-03-05 DIAGNOSIS — N132 Hydronephrosis with renal and ureteral calculous obstruction: Secondary | ICD-10-CM | POA: Diagnosis not present

## 2023-03-05 DIAGNOSIS — Z79891 Long term (current) use of opiate analgesic: Secondary | ICD-10-CM | POA: Diagnosis not present

## 2023-03-05 DIAGNOSIS — K529 Noninfective gastroenteritis and colitis, unspecified: Secondary | ICD-10-CM | POA: Diagnosis not present

## 2023-03-05 LAB — AEROBIC/ANAEROBIC CULTURE W GRAM STAIN (SURGICAL/DEEP WOUND): Culture: NO GROWTH

## 2023-03-05 LAB — O&P RESULT

## 2023-03-05 LAB — OVA + PARASITE EXAM

## 2023-03-08 DIAGNOSIS — I959 Hypotension, unspecified: Secondary | ICD-10-CM | POA: Diagnosis not present

## 2023-03-08 DIAGNOSIS — E538 Deficiency of other specified B group vitamins: Secondary | ICD-10-CM | POA: Diagnosis not present

## 2023-03-08 DIAGNOSIS — I5031 Acute diastolic (congestive) heart failure: Secondary | ICD-10-CM | POA: Diagnosis not present

## 2023-03-08 DIAGNOSIS — Z8616 Personal history of COVID-19: Secondary | ICD-10-CM | POA: Diagnosis not present

## 2023-03-08 DIAGNOSIS — K529 Noninfective gastroenteritis and colitis, unspecified: Secondary | ICD-10-CM | POA: Diagnosis not present

## 2023-03-08 DIAGNOSIS — E876 Hypokalemia: Secondary | ICD-10-CM | POA: Diagnosis not present

## 2023-03-08 DIAGNOSIS — I130 Hypertensive heart and chronic kidney disease with heart failure and stage 1 through stage 4 chronic kidney disease, or unspecified chronic kidney disease: Secondary | ICD-10-CM | POA: Diagnosis not present

## 2023-03-08 DIAGNOSIS — R944 Abnormal results of kidney function studies: Secondary | ICD-10-CM | POA: Diagnosis not present

## 2023-03-08 DIAGNOSIS — E559 Vitamin D deficiency, unspecified: Secondary | ICD-10-CM | POA: Diagnosis not present

## 2023-03-08 DIAGNOSIS — Z96641 Presence of right artificial hip joint: Secondary | ICD-10-CM | POA: Diagnosis not present

## 2023-03-08 DIAGNOSIS — N132 Hydronephrosis with renal and ureteral calculous obstruction: Secondary | ICD-10-CM | POA: Diagnosis not present

## 2023-03-08 DIAGNOSIS — I2510 Atherosclerotic heart disease of native coronary artery without angina pectoris: Secondary | ICD-10-CM | POA: Diagnosis not present

## 2023-03-08 DIAGNOSIS — D631 Anemia in chronic kidney disease: Secondary | ICD-10-CM | POA: Diagnosis not present

## 2023-03-08 DIAGNOSIS — Z79891 Long term (current) use of opiate analgesic: Secondary | ICD-10-CM | POA: Diagnosis not present

## 2023-03-08 DIAGNOSIS — Z8541 Personal history of malignant neoplasm of cervix uteri: Secondary | ICD-10-CM | POA: Diagnosis not present

## 2023-03-08 DIAGNOSIS — M0800 Unspecified juvenile rheumatoid arthritis of unspecified site: Secondary | ICD-10-CM | POA: Diagnosis not present

## 2023-03-08 DIAGNOSIS — L299 Pruritus, unspecified: Secondary | ICD-10-CM | POA: Diagnosis not present

## 2023-03-08 DIAGNOSIS — N1832 Chronic kidney disease, stage 3b: Secondary | ICD-10-CM | POA: Diagnosis not present

## 2023-03-11 ENCOUNTER — Telehealth: Payer: Self-pay | Admitting: *Deleted

## 2023-03-11 NOTE — Progress Notes (Signed)
  Care Coordination Note  03/11/2023 Name: MAUD SCARBRO MRN: [id number] DOB: 1950/10/06  ROHAN COSLETT is a 72 y.o. year old female who is a primary care patient of Jerl Mina, MD and is actively engaged with the care management team. I reached out to Claudette Stapler by phone today to assist with re-scheduling an initial visit with the RN Case Manager  Follow up plan: Patient declines further follow up and engagement by the care management team. Appropriate care team members and provider have been notified via electronic communication.   Burman Nieves, CCMA Care Coordination Care Guide Direct Dial: [phone]

## 2023-03-18 DIAGNOSIS — L299 Pruritus, unspecified: Secondary | ICD-10-CM | POA: Diagnosis not present

## 2023-03-18 DIAGNOSIS — E876 Hypokalemia: Secondary | ICD-10-CM | POA: Diagnosis not present

## 2023-03-18 DIAGNOSIS — D631 Anemia in chronic kidney disease: Secondary | ICD-10-CM | POA: Diagnosis not present

## 2023-03-18 DIAGNOSIS — E538 Deficiency of other specified B group vitamins: Secondary | ICD-10-CM | POA: Diagnosis not present

## 2023-03-18 DIAGNOSIS — Z96649 Presence of unspecified artificial hip joint: Secondary | ICD-10-CM | POA: Diagnosis not present

## 2023-03-18 DIAGNOSIS — Z79891 Long term (current) use of opiate analgesic: Secondary | ICD-10-CM | POA: Diagnosis not present

## 2023-03-18 DIAGNOSIS — M0800 Unspecified juvenile rheumatoid arthritis of unspecified site: Secondary | ICD-10-CM | POA: Diagnosis not present

## 2023-03-18 DIAGNOSIS — I5031 Acute diastolic (congestive) heart failure: Secondary | ICD-10-CM | POA: Diagnosis not present

## 2023-03-18 DIAGNOSIS — R944 Abnormal results of kidney function studies: Secondary | ICD-10-CM | POA: Diagnosis not present

## 2023-03-18 DIAGNOSIS — N1832 Chronic kidney disease, stage 3b: Secondary | ICD-10-CM | POA: Diagnosis not present

## 2023-03-18 DIAGNOSIS — Z96641 Presence of right artificial hip joint: Secondary | ICD-10-CM | POA: Diagnosis not present

## 2023-03-18 DIAGNOSIS — I130 Hypertensive heart and chronic kidney disease with heart failure and stage 1 through stage 4 chronic kidney disease, or unspecified chronic kidney disease: Secondary | ICD-10-CM | POA: Diagnosis not present

## 2023-03-18 DIAGNOSIS — I959 Hypotension, unspecified: Secondary | ICD-10-CM | POA: Diagnosis not present

## 2023-03-18 DIAGNOSIS — T84038A Mechanical loosening of other internal prosthetic joint, initial encounter: Secondary | ICD-10-CM | POA: Diagnosis not present

## 2023-03-18 DIAGNOSIS — I2510 Atherosclerotic heart disease of native coronary artery without angina pectoris: Secondary | ICD-10-CM | POA: Diagnosis not present

## 2023-03-18 DIAGNOSIS — Z8616 Personal history of COVID-19: Secondary | ICD-10-CM | POA: Diagnosis not present

## 2023-03-18 DIAGNOSIS — E559 Vitamin D deficiency, unspecified: Secondary | ICD-10-CM | POA: Diagnosis not present

## 2023-03-18 DIAGNOSIS — K529 Noninfective gastroenteritis and colitis, unspecified: Secondary | ICD-10-CM | POA: Diagnosis not present

## 2023-03-18 DIAGNOSIS — N132 Hydronephrosis with renal and ureteral calculous obstruction: Secondary | ICD-10-CM | POA: Diagnosis not present

## 2023-03-18 DIAGNOSIS — Z8541 Personal history of malignant neoplasm of cervix uteri: Secondary | ICD-10-CM | POA: Diagnosis not present

## 2023-03-20 DIAGNOSIS — I130 Hypertensive heart and chronic kidney disease with heart failure and stage 1 through stage 4 chronic kidney disease, or unspecified chronic kidney disease: Secondary | ICD-10-CM | POA: Diagnosis not present

## 2023-03-20 DIAGNOSIS — D631 Anemia in chronic kidney disease: Secondary | ICD-10-CM | POA: Diagnosis not present

## 2023-03-20 DIAGNOSIS — E538 Deficiency of other specified B group vitamins: Secondary | ICD-10-CM | POA: Diagnosis not present

## 2023-03-20 DIAGNOSIS — L299 Pruritus, unspecified: Secondary | ICD-10-CM | POA: Diagnosis not present

## 2023-03-20 DIAGNOSIS — K529 Noninfective gastroenteritis and colitis, unspecified: Secondary | ICD-10-CM | POA: Diagnosis not present

## 2023-03-20 DIAGNOSIS — E559 Vitamin D deficiency, unspecified: Secondary | ICD-10-CM | POA: Diagnosis not present

## 2023-03-20 DIAGNOSIS — Z79891 Long term (current) use of opiate analgesic: Secondary | ICD-10-CM | POA: Diagnosis not present

## 2023-03-20 DIAGNOSIS — I5031 Acute diastolic (congestive) heart failure: Secondary | ICD-10-CM | POA: Diagnosis not present

## 2023-03-20 DIAGNOSIS — N1832 Chronic kidney disease, stage 3b: Secondary | ICD-10-CM | POA: Diagnosis not present

## 2023-03-20 DIAGNOSIS — I959 Hypotension, unspecified: Secondary | ICD-10-CM | POA: Diagnosis not present

## 2023-03-20 DIAGNOSIS — Z96641 Presence of right artificial hip joint: Secondary | ICD-10-CM | POA: Diagnosis not present

## 2023-03-20 DIAGNOSIS — E876 Hypokalemia: Secondary | ICD-10-CM | POA: Diagnosis not present

## 2023-03-20 DIAGNOSIS — I2510 Atherosclerotic heart disease of native coronary artery without angina pectoris: Secondary | ICD-10-CM | POA: Diagnosis not present

## 2023-03-20 DIAGNOSIS — N132 Hydronephrosis with renal and ureteral calculous obstruction: Secondary | ICD-10-CM | POA: Diagnosis not present

## 2023-03-20 DIAGNOSIS — Z8541 Personal history of malignant neoplasm of cervix uteri: Secondary | ICD-10-CM | POA: Diagnosis not present

## 2023-03-20 DIAGNOSIS — M0800 Unspecified juvenile rheumatoid arthritis of unspecified site: Secondary | ICD-10-CM | POA: Diagnosis not present

## 2023-03-20 DIAGNOSIS — R944 Abnormal results of kidney function studies: Secondary | ICD-10-CM | POA: Diagnosis not present

## 2023-03-20 DIAGNOSIS — Z8616 Personal history of COVID-19: Secondary | ICD-10-CM | POA: Diagnosis not present

## 2023-03-22 DIAGNOSIS — Z96649 Presence of unspecified artificial hip joint: Secondary | ICD-10-CM | POA: Diagnosis not present

## 2023-03-23 DIAGNOSIS — Z96641 Presence of right artificial hip joint: Secondary | ICD-10-CM | POA: Diagnosis not present

## 2023-03-23 DIAGNOSIS — I5030 Unspecified diastolic (congestive) heart failure: Secondary | ICD-10-CM | POA: Diagnosis not present

## 2023-03-23 DIAGNOSIS — M436 Torticollis: Secondary | ICD-10-CM | POA: Diagnosis not present

## 2023-03-23 DIAGNOSIS — K529 Noninfective gastroenteritis and colitis, unspecified: Secondary | ICD-10-CM | POA: Diagnosis not present

## 2023-03-23 DIAGNOSIS — I2510 Atherosclerotic heart disease of native coronary artery without angina pectoris: Secondary | ICD-10-CM | POA: Diagnosis not present

## 2023-03-23 DIAGNOSIS — Z993 Dependence on wheelchair: Secondary | ICD-10-CM | POA: Diagnosis not present

## 2023-03-23 DIAGNOSIS — M0800 Unspecified juvenile rheumatoid arthritis of unspecified site: Secondary | ICD-10-CM | POA: Diagnosis not present

## 2023-03-23 DIAGNOSIS — E538 Deficiency of other specified B group vitamins: Secondary | ICD-10-CM | POA: Diagnosis not present

## 2023-03-23 DIAGNOSIS — Z8616 Personal history of COVID-19: Secondary | ICD-10-CM | POA: Diagnosis not present

## 2023-03-23 DIAGNOSIS — M25572 Pain in left ankle and joints of left foot: Secondary | ICD-10-CM | POA: Diagnosis not present

## 2023-03-23 DIAGNOSIS — N132 Hydronephrosis with renal and ureteral calculous obstruction: Secondary | ICD-10-CM | POA: Diagnosis not present

## 2023-03-23 DIAGNOSIS — Z8541 Personal history of malignant neoplasm of cervix uteri: Secondary | ICD-10-CM | POA: Diagnosis not present

## 2023-03-23 DIAGNOSIS — I130 Hypertensive heart and chronic kidney disease with heart failure and stage 1 through stage 4 chronic kidney disease, or unspecified chronic kidney disease: Secondary | ICD-10-CM | POA: Diagnosis not present

## 2023-03-23 DIAGNOSIS — M24541 Contracture, right hand: Secondary | ICD-10-CM | POA: Diagnosis not present

## 2023-03-23 DIAGNOSIS — D631 Anemia in chronic kidney disease: Secondary | ICD-10-CM | POA: Diagnosis not present

## 2023-03-23 DIAGNOSIS — N1832 Chronic kidney disease, stage 3b: Secondary | ICD-10-CM | POA: Diagnosis not present

## 2023-03-23 DIAGNOSIS — G8929 Other chronic pain: Secondary | ICD-10-CM | POA: Diagnosis not present

## 2023-03-23 DIAGNOSIS — E559 Vitamin D deficiency, unspecified: Secondary | ICD-10-CM | POA: Diagnosis not present

## 2023-03-23 DIAGNOSIS — M24542 Contracture, left hand: Secondary | ICD-10-CM | POA: Diagnosis not present

## 2023-03-25 DIAGNOSIS — Z993 Dependence on wheelchair: Secondary | ICD-10-CM | POA: Diagnosis not present

## 2023-03-25 DIAGNOSIS — M24541 Contracture, right hand: Secondary | ICD-10-CM | POA: Diagnosis not present

## 2023-03-25 DIAGNOSIS — Z8541 Personal history of malignant neoplasm of cervix uteri: Secondary | ICD-10-CM | POA: Diagnosis not present

## 2023-03-25 DIAGNOSIS — I5030 Unspecified diastolic (congestive) heart failure: Secondary | ICD-10-CM | POA: Diagnosis not present

## 2023-03-25 DIAGNOSIS — I130 Hypertensive heart and chronic kidney disease with heart failure and stage 1 through stage 4 chronic kidney disease, or unspecified chronic kidney disease: Secondary | ICD-10-CM | POA: Diagnosis not present

## 2023-03-25 DIAGNOSIS — K529 Noninfective gastroenteritis and colitis, unspecified: Secondary | ICD-10-CM | POA: Diagnosis not present

## 2023-03-25 DIAGNOSIS — M0800 Unspecified juvenile rheumatoid arthritis of unspecified site: Secondary | ICD-10-CM | POA: Diagnosis not present

## 2023-03-25 DIAGNOSIS — E559 Vitamin D deficiency, unspecified: Secondary | ICD-10-CM | POA: Diagnosis not present

## 2023-03-25 DIAGNOSIS — Z8616 Personal history of COVID-19: Secondary | ICD-10-CM | POA: Diagnosis not present

## 2023-03-25 DIAGNOSIS — D631 Anemia in chronic kidney disease: Secondary | ICD-10-CM | POA: Diagnosis not present

## 2023-03-25 DIAGNOSIS — M436 Torticollis: Secondary | ICD-10-CM | POA: Diagnosis not present

## 2023-03-25 DIAGNOSIS — E538 Deficiency of other specified B group vitamins: Secondary | ICD-10-CM | POA: Diagnosis not present

## 2023-03-25 DIAGNOSIS — M25572 Pain in left ankle and joints of left foot: Secondary | ICD-10-CM | POA: Diagnosis not present

## 2023-03-25 DIAGNOSIS — I2510 Atherosclerotic heart disease of native coronary artery without angina pectoris: Secondary | ICD-10-CM | POA: Diagnosis not present

## 2023-03-25 DIAGNOSIS — G8929 Other chronic pain: Secondary | ICD-10-CM | POA: Diagnosis not present

## 2023-03-25 DIAGNOSIS — N132 Hydronephrosis with renal and ureteral calculous obstruction: Secondary | ICD-10-CM | POA: Diagnosis not present

## 2023-03-25 DIAGNOSIS — M24542 Contracture, left hand: Secondary | ICD-10-CM | POA: Diagnosis not present

## 2023-03-25 DIAGNOSIS — Z96641 Presence of right artificial hip joint: Secondary | ICD-10-CM | POA: Diagnosis not present

## 2023-03-25 DIAGNOSIS — N1832 Chronic kidney disease, stage 3b: Secondary | ICD-10-CM | POA: Diagnosis not present

## 2023-03-28 DIAGNOSIS — E559 Vitamin D deficiency, unspecified: Secondary | ICD-10-CM | POA: Diagnosis not present

## 2023-03-28 DIAGNOSIS — M24541 Contracture, right hand: Secondary | ICD-10-CM | POA: Diagnosis not present

## 2023-03-28 DIAGNOSIS — Z96641 Presence of right artificial hip joint: Secondary | ICD-10-CM | POA: Diagnosis not present

## 2023-03-28 DIAGNOSIS — M24542 Contracture, left hand: Secondary | ICD-10-CM | POA: Diagnosis not present

## 2023-03-28 DIAGNOSIS — M436 Torticollis: Secondary | ICD-10-CM | POA: Diagnosis not present

## 2023-03-28 DIAGNOSIS — M25572 Pain in left ankle and joints of left foot: Secondary | ICD-10-CM | POA: Diagnosis not present

## 2023-03-28 DIAGNOSIS — Z8616 Personal history of COVID-19: Secondary | ICD-10-CM | POA: Diagnosis not present

## 2023-03-28 DIAGNOSIS — N132 Hydronephrosis with renal and ureteral calculous obstruction: Secondary | ICD-10-CM | POA: Diagnosis not present

## 2023-03-28 DIAGNOSIS — Z993 Dependence on wheelchair: Secondary | ICD-10-CM | POA: Diagnosis not present

## 2023-03-28 DIAGNOSIS — I2510 Atherosclerotic heart disease of native coronary artery without angina pectoris: Secondary | ICD-10-CM | POA: Diagnosis not present

## 2023-03-28 DIAGNOSIS — D631 Anemia in chronic kidney disease: Secondary | ICD-10-CM | POA: Diagnosis not present

## 2023-03-28 DIAGNOSIS — M0800 Unspecified juvenile rheumatoid arthritis of unspecified site: Secondary | ICD-10-CM | POA: Diagnosis not present

## 2023-03-28 DIAGNOSIS — I130 Hypertensive heart and chronic kidney disease with heart failure and stage 1 through stage 4 chronic kidney disease, or unspecified chronic kidney disease: Secondary | ICD-10-CM | POA: Diagnosis not present

## 2023-03-28 DIAGNOSIS — E538 Deficiency of other specified B group vitamins: Secondary | ICD-10-CM | POA: Diagnosis not present

## 2023-03-28 DIAGNOSIS — Z8541 Personal history of malignant neoplasm of cervix uteri: Secondary | ICD-10-CM | POA: Diagnosis not present

## 2023-03-28 DIAGNOSIS — G8929 Other chronic pain: Secondary | ICD-10-CM | POA: Diagnosis not present

## 2023-03-28 DIAGNOSIS — K529 Noninfective gastroenteritis and colitis, unspecified: Secondary | ICD-10-CM | POA: Diagnosis not present

## 2023-03-28 DIAGNOSIS — I5030 Unspecified diastolic (congestive) heart failure: Secondary | ICD-10-CM | POA: Diagnosis not present

## 2023-03-28 DIAGNOSIS — N1832 Chronic kidney disease, stage 3b: Secondary | ICD-10-CM | POA: Diagnosis not present

## 2023-03-28 DIAGNOSIS — R8290 Unspecified abnormal findings in urine: Secondary | ICD-10-CM | POA: Diagnosis not present

## 2023-04-02 DIAGNOSIS — N132 Hydronephrosis with renal and ureteral calculous obstruction: Secondary | ICD-10-CM | POA: Diagnosis not present

## 2023-04-02 DIAGNOSIS — I5030 Unspecified diastolic (congestive) heart failure: Secondary | ICD-10-CM | POA: Diagnosis not present

## 2023-04-02 DIAGNOSIS — I2510 Atherosclerotic heart disease of native coronary artery without angina pectoris: Secondary | ICD-10-CM | POA: Diagnosis not present

## 2023-04-02 DIAGNOSIS — Z96641 Presence of right artificial hip joint: Secondary | ICD-10-CM | POA: Diagnosis not present

## 2023-04-02 DIAGNOSIS — M24542 Contracture, left hand: Secondary | ICD-10-CM | POA: Diagnosis not present

## 2023-04-02 DIAGNOSIS — Z8541 Personal history of malignant neoplasm of cervix uteri: Secondary | ICD-10-CM | POA: Diagnosis not present

## 2023-04-02 DIAGNOSIS — D631 Anemia in chronic kidney disease: Secondary | ICD-10-CM | POA: Diagnosis not present

## 2023-04-02 DIAGNOSIS — G8929 Other chronic pain: Secondary | ICD-10-CM | POA: Diagnosis not present

## 2023-04-02 DIAGNOSIS — M436 Torticollis: Secondary | ICD-10-CM | POA: Diagnosis not present

## 2023-04-02 DIAGNOSIS — E538 Deficiency of other specified B group vitamins: Secondary | ICD-10-CM | POA: Diagnosis not present

## 2023-04-02 DIAGNOSIS — M25572 Pain in left ankle and joints of left foot: Secondary | ICD-10-CM | POA: Diagnosis not present

## 2023-04-02 DIAGNOSIS — N1832 Chronic kidney disease, stage 3b: Secondary | ICD-10-CM | POA: Diagnosis not present

## 2023-04-02 DIAGNOSIS — K529 Noninfective gastroenteritis and colitis, unspecified: Secondary | ICD-10-CM | POA: Diagnosis not present

## 2023-04-02 DIAGNOSIS — M24541 Contracture, right hand: Secondary | ICD-10-CM | POA: Diagnosis not present

## 2023-04-02 DIAGNOSIS — E559 Vitamin D deficiency, unspecified: Secondary | ICD-10-CM | POA: Diagnosis not present

## 2023-04-02 DIAGNOSIS — Z993 Dependence on wheelchair: Secondary | ICD-10-CM | POA: Diagnosis not present

## 2023-04-02 DIAGNOSIS — M0800 Unspecified juvenile rheumatoid arthritis of unspecified site: Secondary | ICD-10-CM | POA: Diagnosis not present

## 2023-04-02 DIAGNOSIS — Z8616 Personal history of COVID-19: Secondary | ICD-10-CM | POA: Diagnosis not present

## 2023-04-02 DIAGNOSIS — I130 Hypertensive heart and chronic kidney disease with heart failure and stage 1 through stage 4 chronic kidney disease, or unspecified chronic kidney disease: Secondary | ICD-10-CM | POA: Diagnosis not present

## 2023-04-03 DIAGNOSIS — M436 Torticollis: Secondary | ICD-10-CM | POA: Diagnosis not present

## 2023-04-03 DIAGNOSIS — M25572 Pain in left ankle and joints of left foot: Secondary | ICD-10-CM | POA: Diagnosis not present

## 2023-04-03 DIAGNOSIS — D631 Anemia in chronic kidney disease: Secondary | ICD-10-CM | POA: Diagnosis not present

## 2023-04-03 DIAGNOSIS — I2510 Atherosclerotic heart disease of native coronary artery without angina pectoris: Secondary | ICD-10-CM | POA: Diagnosis not present

## 2023-04-03 DIAGNOSIS — Z8616 Personal history of COVID-19: Secondary | ICD-10-CM | POA: Diagnosis not present

## 2023-04-03 DIAGNOSIS — Z993 Dependence on wheelchair: Secondary | ICD-10-CM | POA: Diagnosis not present

## 2023-04-03 DIAGNOSIS — E559 Vitamin D deficiency, unspecified: Secondary | ICD-10-CM | POA: Diagnosis not present

## 2023-04-03 DIAGNOSIS — K529 Noninfective gastroenteritis and colitis, unspecified: Secondary | ICD-10-CM | POA: Diagnosis not present

## 2023-04-03 DIAGNOSIS — E538 Deficiency of other specified B group vitamins: Secondary | ICD-10-CM | POA: Diagnosis not present

## 2023-04-03 DIAGNOSIS — M0800 Unspecified juvenile rheumatoid arthritis of unspecified site: Secondary | ICD-10-CM | POA: Diagnosis not present

## 2023-04-03 DIAGNOSIS — M24542 Contracture, left hand: Secondary | ICD-10-CM | POA: Diagnosis not present

## 2023-04-03 DIAGNOSIS — I130 Hypertensive heart and chronic kidney disease with heart failure and stage 1 through stage 4 chronic kidney disease, or unspecified chronic kidney disease: Secondary | ICD-10-CM | POA: Diagnosis not present

## 2023-04-03 DIAGNOSIS — Z96641 Presence of right artificial hip joint: Secondary | ICD-10-CM | POA: Diagnosis not present

## 2023-04-03 DIAGNOSIS — Z8541 Personal history of malignant neoplasm of cervix uteri: Secondary | ICD-10-CM | POA: Diagnosis not present

## 2023-04-03 DIAGNOSIS — I5030 Unspecified diastolic (congestive) heart failure: Secondary | ICD-10-CM | POA: Diagnosis not present

## 2023-04-03 DIAGNOSIS — N1832 Chronic kidney disease, stage 3b: Secondary | ICD-10-CM | POA: Diagnosis not present

## 2023-04-03 DIAGNOSIS — G8929 Other chronic pain: Secondary | ICD-10-CM | POA: Diagnosis not present

## 2023-04-03 DIAGNOSIS — N132 Hydronephrosis with renal and ureteral calculous obstruction: Secondary | ICD-10-CM | POA: Diagnosis not present

## 2023-04-03 DIAGNOSIS — M24541 Contracture, right hand: Secondary | ICD-10-CM | POA: Diagnosis not present

## 2023-04-04 DIAGNOSIS — M436 Torticollis: Secondary | ICD-10-CM | POA: Diagnosis not present

## 2023-04-04 DIAGNOSIS — I2510 Atherosclerotic heart disease of native coronary artery without angina pectoris: Secondary | ICD-10-CM | POA: Diagnosis not present

## 2023-04-04 DIAGNOSIS — I5030 Unspecified diastolic (congestive) heart failure: Secondary | ICD-10-CM | POA: Diagnosis not present

## 2023-04-04 DIAGNOSIS — D631 Anemia in chronic kidney disease: Secondary | ICD-10-CM | POA: Diagnosis not present

## 2023-04-04 DIAGNOSIS — M0800 Unspecified juvenile rheumatoid arthritis of unspecified site: Secondary | ICD-10-CM | POA: Diagnosis not present

## 2023-04-04 DIAGNOSIS — N132 Hydronephrosis with renal and ureteral calculous obstruction: Secondary | ICD-10-CM | POA: Diagnosis not present

## 2023-04-04 DIAGNOSIS — Z8541 Personal history of malignant neoplasm of cervix uteri: Secondary | ICD-10-CM | POA: Diagnosis not present

## 2023-04-04 DIAGNOSIS — M24542 Contracture, left hand: Secondary | ICD-10-CM | POA: Diagnosis not present

## 2023-04-04 DIAGNOSIS — Z993 Dependence on wheelchair: Secondary | ICD-10-CM | POA: Diagnosis not present

## 2023-04-04 DIAGNOSIS — Z96641 Presence of right artificial hip joint: Secondary | ICD-10-CM | POA: Diagnosis not present

## 2023-04-04 DIAGNOSIS — M25572 Pain in left ankle and joints of left foot: Secondary | ICD-10-CM | POA: Diagnosis not present

## 2023-04-04 DIAGNOSIS — G8929 Other chronic pain: Secondary | ICD-10-CM | POA: Diagnosis not present

## 2023-04-04 DIAGNOSIS — N1832 Chronic kidney disease, stage 3b: Secondary | ICD-10-CM | POA: Diagnosis not present

## 2023-04-04 DIAGNOSIS — I130 Hypertensive heart and chronic kidney disease with heart failure and stage 1 through stage 4 chronic kidney disease, or unspecified chronic kidney disease: Secondary | ICD-10-CM | POA: Diagnosis not present

## 2023-04-04 DIAGNOSIS — M24541 Contracture, right hand: Secondary | ICD-10-CM | POA: Diagnosis not present

## 2023-04-04 DIAGNOSIS — E559 Vitamin D deficiency, unspecified: Secondary | ICD-10-CM | POA: Diagnosis not present

## 2023-04-04 DIAGNOSIS — K529 Noninfective gastroenteritis and colitis, unspecified: Secondary | ICD-10-CM | POA: Diagnosis not present

## 2023-04-04 DIAGNOSIS — Z8616 Personal history of COVID-19: Secondary | ICD-10-CM | POA: Diagnosis not present

## 2023-04-04 DIAGNOSIS — E538 Deficiency of other specified B group vitamins: Secondary | ICD-10-CM | POA: Diagnosis not present

## 2023-04-09 DIAGNOSIS — M24542 Contracture, left hand: Secondary | ICD-10-CM | POA: Diagnosis not present

## 2023-04-09 DIAGNOSIS — I2510 Atherosclerotic heart disease of native coronary artery without angina pectoris: Secondary | ICD-10-CM | POA: Diagnosis not present

## 2023-04-09 DIAGNOSIS — Z8541 Personal history of malignant neoplasm of cervix uteri: Secondary | ICD-10-CM | POA: Diagnosis not present

## 2023-04-09 DIAGNOSIS — E538 Deficiency of other specified B group vitamins: Secondary | ICD-10-CM | POA: Diagnosis not present

## 2023-04-09 DIAGNOSIS — M0800 Unspecified juvenile rheumatoid arthritis of unspecified site: Secondary | ICD-10-CM | POA: Diagnosis not present

## 2023-04-09 DIAGNOSIS — I130 Hypertensive heart and chronic kidney disease with heart failure and stage 1 through stage 4 chronic kidney disease, or unspecified chronic kidney disease: Secondary | ICD-10-CM | POA: Diagnosis not present

## 2023-04-09 DIAGNOSIS — Z96641 Presence of right artificial hip joint: Secondary | ICD-10-CM | POA: Diagnosis not present

## 2023-04-09 DIAGNOSIS — G8929 Other chronic pain: Secondary | ICD-10-CM | POA: Diagnosis not present

## 2023-04-09 DIAGNOSIS — I5030 Unspecified diastolic (congestive) heart failure: Secondary | ICD-10-CM | POA: Diagnosis not present

## 2023-04-09 DIAGNOSIS — Z8616 Personal history of COVID-19: Secondary | ICD-10-CM | POA: Diagnosis not present

## 2023-04-09 DIAGNOSIS — N132 Hydronephrosis with renal and ureteral calculous obstruction: Secondary | ICD-10-CM | POA: Diagnosis not present

## 2023-04-09 DIAGNOSIS — E559 Vitamin D deficiency, unspecified: Secondary | ICD-10-CM | POA: Diagnosis not present

## 2023-04-09 DIAGNOSIS — Z993 Dependence on wheelchair: Secondary | ICD-10-CM | POA: Diagnosis not present

## 2023-04-09 DIAGNOSIS — N1832 Chronic kidney disease, stage 3b: Secondary | ICD-10-CM | POA: Diagnosis not present

## 2023-04-09 DIAGNOSIS — M25572 Pain in left ankle and joints of left foot: Secondary | ICD-10-CM | POA: Diagnosis not present

## 2023-04-09 DIAGNOSIS — M24541 Contracture, right hand: Secondary | ICD-10-CM | POA: Diagnosis not present

## 2023-04-09 DIAGNOSIS — D631 Anemia in chronic kidney disease: Secondary | ICD-10-CM | POA: Diagnosis not present

## 2023-04-09 DIAGNOSIS — K529 Noninfective gastroenteritis and colitis, unspecified: Secondary | ICD-10-CM | POA: Diagnosis not present

## 2023-04-09 DIAGNOSIS — M436 Torticollis: Secondary | ICD-10-CM | POA: Diagnosis not present

## 2023-04-11 DIAGNOSIS — M25572 Pain in left ankle and joints of left foot: Secondary | ICD-10-CM | POA: Diagnosis not present

## 2023-04-11 DIAGNOSIS — N132 Hydronephrosis with renal and ureteral calculous obstruction: Secondary | ICD-10-CM | POA: Diagnosis not present

## 2023-04-11 DIAGNOSIS — E538 Deficiency of other specified B group vitamins: Secondary | ICD-10-CM | POA: Diagnosis not present

## 2023-04-11 DIAGNOSIS — N1832 Chronic kidney disease, stage 3b: Secondary | ICD-10-CM | POA: Diagnosis not present

## 2023-04-11 DIAGNOSIS — E559 Vitamin D deficiency, unspecified: Secondary | ICD-10-CM | POA: Diagnosis not present

## 2023-04-11 DIAGNOSIS — M24542 Contracture, left hand: Secondary | ICD-10-CM | POA: Diagnosis not present

## 2023-04-11 DIAGNOSIS — K529 Noninfective gastroenteritis and colitis, unspecified: Secondary | ICD-10-CM | POA: Diagnosis not present

## 2023-04-11 DIAGNOSIS — I2510 Atherosclerotic heart disease of native coronary artery without angina pectoris: Secondary | ICD-10-CM | POA: Diagnosis not present

## 2023-04-11 DIAGNOSIS — I5030 Unspecified diastolic (congestive) heart failure: Secondary | ICD-10-CM | POA: Diagnosis not present

## 2023-04-11 DIAGNOSIS — M24541 Contracture, right hand: Secondary | ICD-10-CM | POA: Diagnosis not present

## 2023-04-11 DIAGNOSIS — D631 Anemia in chronic kidney disease: Secondary | ICD-10-CM | POA: Diagnosis not present

## 2023-04-11 DIAGNOSIS — Z8616 Personal history of COVID-19: Secondary | ICD-10-CM | POA: Diagnosis not present

## 2023-04-11 DIAGNOSIS — I130 Hypertensive heart and chronic kidney disease with heart failure and stage 1 through stage 4 chronic kidney disease, or unspecified chronic kidney disease: Secondary | ICD-10-CM | POA: Diagnosis not present

## 2023-04-11 DIAGNOSIS — M436 Torticollis: Secondary | ICD-10-CM | POA: Diagnosis not present

## 2023-04-11 DIAGNOSIS — Z96641 Presence of right artificial hip joint: Secondary | ICD-10-CM | POA: Diagnosis not present

## 2023-04-11 DIAGNOSIS — M0800 Unspecified juvenile rheumatoid arthritis of unspecified site: Secondary | ICD-10-CM | POA: Diagnosis not present

## 2023-04-11 DIAGNOSIS — Z8541 Personal history of malignant neoplasm of cervix uteri: Secondary | ICD-10-CM | POA: Diagnosis not present

## 2023-04-11 DIAGNOSIS — G8929 Other chronic pain: Secondary | ICD-10-CM | POA: Diagnosis not present

## 2023-04-11 DIAGNOSIS — Z993 Dependence on wheelchair: Secondary | ICD-10-CM | POA: Diagnosis not present

## 2023-04-15 DIAGNOSIS — I130 Hypertensive heart and chronic kidney disease with heart failure and stage 1 through stage 4 chronic kidney disease, or unspecified chronic kidney disease: Secondary | ICD-10-CM | POA: Diagnosis not present

## 2023-04-15 DIAGNOSIS — K529 Noninfective gastroenteritis and colitis, unspecified: Secondary | ICD-10-CM | POA: Diagnosis not present

## 2023-04-15 DIAGNOSIS — D631 Anemia in chronic kidney disease: Secondary | ICD-10-CM | POA: Diagnosis not present

## 2023-04-15 DIAGNOSIS — N132 Hydronephrosis with renal and ureteral calculous obstruction: Secondary | ICD-10-CM | POA: Diagnosis not present

## 2023-04-15 DIAGNOSIS — M24541 Contracture, right hand: Secondary | ICD-10-CM | POA: Diagnosis not present

## 2023-04-15 DIAGNOSIS — I5030 Unspecified diastolic (congestive) heart failure: Secondary | ICD-10-CM | POA: Diagnosis not present

## 2023-04-15 DIAGNOSIS — M436 Torticollis: Secondary | ICD-10-CM | POA: Diagnosis not present

## 2023-04-15 DIAGNOSIS — M25572 Pain in left ankle and joints of left foot: Secondary | ICD-10-CM | POA: Diagnosis not present

## 2023-04-15 DIAGNOSIS — E538 Deficiency of other specified B group vitamins: Secondary | ICD-10-CM | POA: Diagnosis not present

## 2023-04-15 DIAGNOSIS — Z8541 Personal history of malignant neoplasm of cervix uteri: Secondary | ICD-10-CM | POA: Diagnosis not present

## 2023-04-15 DIAGNOSIS — Z96641 Presence of right artificial hip joint: Secondary | ICD-10-CM | POA: Diagnosis not present

## 2023-04-15 DIAGNOSIS — M24542 Contracture, left hand: Secondary | ICD-10-CM | POA: Diagnosis not present

## 2023-04-15 DIAGNOSIS — N1832 Chronic kidney disease, stage 3b: Secondary | ICD-10-CM | POA: Diagnosis not present

## 2023-04-15 DIAGNOSIS — M0800 Unspecified juvenile rheumatoid arthritis of unspecified site: Secondary | ICD-10-CM | POA: Diagnosis not present

## 2023-04-15 DIAGNOSIS — Z8616 Personal history of COVID-19: Secondary | ICD-10-CM | POA: Diagnosis not present

## 2023-04-15 DIAGNOSIS — Z993 Dependence on wheelchair: Secondary | ICD-10-CM | POA: Diagnosis not present

## 2023-04-15 DIAGNOSIS — G8929 Other chronic pain: Secondary | ICD-10-CM | POA: Diagnosis not present

## 2023-04-15 DIAGNOSIS — I2510 Atherosclerotic heart disease of native coronary artery without angina pectoris: Secondary | ICD-10-CM | POA: Diagnosis not present

## 2023-04-15 DIAGNOSIS — E559 Vitamin D deficiency, unspecified: Secondary | ICD-10-CM | POA: Diagnosis not present

## 2023-04-16 DIAGNOSIS — N390 Urinary tract infection, site not specified: Secondary | ICD-10-CM | POA: Diagnosis not present

## 2023-04-16 DIAGNOSIS — R300 Dysuria: Secondary | ICD-10-CM | POA: Diagnosis not present

## 2023-04-16 DIAGNOSIS — R8290 Unspecified abnormal findings in urine: Secondary | ICD-10-CM | POA: Diagnosis not present

## 2023-04-18 DIAGNOSIS — Z96641 Presence of right artificial hip joint: Secondary | ICD-10-CM | POA: Diagnosis not present

## 2023-04-19 DIAGNOSIS — Z993 Dependence on wheelchair: Secondary | ICD-10-CM | POA: Diagnosis not present

## 2023-04-19 DIAGNOSIS — I2510 Atherosclerotic heart disease of native coronary artery without angina pectoris: Secondary | ICD-10-CM | POA: Diagnosis not present

## 2023-04-19 DIAGNOSIS — N1832 Chronic kidney disease, stage 3b: Secondary | ICD-10-CM | POA: Diagnosis not present

## 2023-04-19 DIAGNOSIS — Z8616 Personal history of COVID-19: Secondary | ICD-10-CM | POA: Diagnosis not present

## 2023-04-19 DIAGNOSIS — M25572 Pain in left ankle and joints of left foot: Secondary | ICD-10-CM | POA: Diagnosis not present

## 2023-04-19 DIAGNOSIS — M24542 Contracture, left hand: Secondary | ICD-10-CM | POA: Diagnosis not present

## 2023-04-19 DIAGNOSIS — Z96641 Presence of right artificial hip joint: Secondary | ICD-10-CM | POA: Diagnosis not present

## 2023-04-19 DIAGNOSIS — E559 Vitamin D deficiency, unspecified: Secondary | ICD-10-CM | POA: Diagnosis not present

## 2023-04-19 DIAGNOSIS — I5030 Unspecified diastolic (congestive) heart failure: Secondary | ICD-10-CM | POA: Diagnosis not present

## 2023-04-19 DIAGNOSIS — M0800 Unspecified juvenile rheumatoid arthritis of unspecified site: Secondary | ICD-10-CM | POA: Diagnosis not present

## 2023-04-19 DIAGNOSIS — I130 Hypertensive heart and chronic kidney disease with heart failure and stage 1 through stage 4 chronic kidney disease, or unspecified chronic kidney disease: Secondary | ICD-10-CM | POA: Diagnosis not present

## 2023-04-19 DIAGNOSIS — M436 Torticollis: Secondary | ICD-10-CM | POA: Diagnosis not present

## 2023-04-19 DIAGNOSIS — K529 Noninfective gastroenteritis and colitis, unspecified: Secondary | ICD-10-CM | POA: Diagnosis not present

## 2023-04-19 DIAGNOSIS — Z8541 Personal history of malignant neoplasm of cervix uteri: Secondary | ICD-10-CM | POA: Diagnosis not present

## 2023-04-19 DIAGNOSIS — E538 Deficiency of other specified B group vitamins: Secondary | ICD-10-CM | POA: Diagnosis not present

## 2023-04-19 DIAGNOSIS — G8929 Other chronic pain: Secondary | ICD-10-CM | POA: Diagnosis not present

## 2023-04-19 DIAGNOSIS — D631 Anemia in chronic kidney disease: Secondary | ICD-10-CM | POA: Diagnosis not present

## 2023-04-19 DIAGNOSIS — N132 Hydronephrosis with renal and ureteral calculous obstruction: Secondary | ICD-10-CM | POA: Diagnosis not present

## 2023-04-19 DIAGNOSIS — M24541 Contracture, right hand: Secondary | ICD-10-CM | POA: Diagnosis not present

## 2023-04-26 DIAGNOSIS — Z96641 Presence of right artificial hip joint: Secondary | ICD-10-CM | POA: Diagnosis not present

## 2023-04-26 DIAGNOSIS — I130 Hypertensive heart and chronic kidney disease with heart failure and stage 1 through stage 4 chronic kidney disease, or unspecified chronic kidney disease: Secondary | ICD-10-CM | POA: Diagnosis not present

## 2023-04-26 DIAGNOSIS — K529 Noninfective gastroenteritis and colitis, unspecified: Secondary | ICD-10-CM | POA: Diagnosis not present

## 2023-04-26 DIAGNOSIS — Z8616 Personal history of COVID-19: Secondary | ICD-10-CM | POA: Diagnosis not present

## 2023-04-26 DIAGNOSIS — E538 Deficiency of other specified B group vitamins: Secondary | ICD-10-CM | POA: Diagnosis not present

## 2023-04-26 DIAGNOSIS — Z8541 Personal history of malignant neoplasm of cervix uteri: Secondary | ICD-10-CM | POA: Diagnosis not present

## 2023-04-26 DIAGNOSIS — M24541 Contracture, right hand: Secondary | ICD-10-CM | POA: Diagnosis not present

## 2023-04-26 DIAGNOSIS — N132 Hydronephrosis with renal and ureteral calculous obstruction: Secondary | ICD-10-CM | POA: Diagnosis not present

## 2023-04-26 DIAGNOSIS — M0800 Unspecified juvenile rheumatoid arthritis of unspecified site: Secondary | ICD-10-CM | POA: Diagnosis not present

## 2023-04-26 DIAGNOSIS — G8929 Other chronic pain: Secondary | ICD-10-CM | POA: Diagnosis not present

## 2023-04-26 DIAGNOSIS — E559 Vitamin D deficiency, unspecified: Secondary | ICD-10-CM | POA: Diagnosis not present

## 2023-04-26 DIAGNOSIS — M436 Torticollis: Secondary | ICD-10-CM | POA: Diagnosis not present

## 2023-04-26 DIAGNOSIS — Z993 Dependence on wheelchair: Secondary | ICD-10-CM | POA: Diagnosis not present

## 2023-04-26 DIAGNOSIS — I5030 Unspecified diastolic (congestive) heart failure: Secondary | ICD-10-CM | POA: Diagnosis not present

## 2023-04-26 DIAGNOSIS — M25572 Pain in left ankle and joints of left foot: Secondary | ICD-10-CM | POA: Diagnosis not present

## 2023-04-26 DIAGNOSIS — I2510 Atherosclerotic heart disease of native coronary artery without angina pectoris: Secondary | ICD-10-CM | POA: Diagnosis not present

## 2023-04-26 DIAGNOSIS — D631 Anemia in chronic kidney disease: Secondary | ICD-10-CM | POA: Diagnosis not present

## 2023-04-26 DIAGNOSIS — M24542 Contracture, left hand: Secondary | ICD-10-CM | POA: Diagnosis not present

## 2023-04-26 DIAGNOSIS — N1832 Chronic kidney disease, stage 3b: Secondary | ICD-10-CM | POA: Diagnosis not present

## 2023-04-30 DIAGNOSIS — K529 Noninfective gastroenteritis and colitis, unspecified: Secondary | ICD-10-CM | POA: Diagnosis not present

## 2023-04-30 DIAGNOSIS — I5030 Unspecified diastolic (congestive) heart failure: Secondary | ICD-10-CM | POA: Diagnosis not present

## 2023-04-30 DIAGNOSIS — E559 Vitamin D deficiency, unspecified: Secondary | ICD-10-CM | POA: Diagnosis not present

## 2023-04-30 DIAGNOSIS — N132 Hydronephrosis with renal and ureteral calculous obstruction: Secondary | ICD-10-CM | POA: Diagnosis not present

## 2023-04-30 DIAGNOSIS — Z96641 Presence of right artificial hip joint: Secondary | ICD-10-CM | POA: Diagnosis not present

## 2023-04-30 DIAGNOSIS — Z8616 Personal history of COVID-19: Secondary | ICD-10-CM | POA: Diagnosis not present

## 2023-04-30 DIAGNOSIS — M24542 Contracture, left hand: Secondary | ICD-10-CM | POA: Diagnosis not present

## 2023-04-30 DIAGNOSIS — D631 Anemia in chronic kidney disease: Secondary | ICD-10-CM | POA: Diagnosis not present

## 2023-04-30 DIAGNOSIS — I130 Hypertensive heart and chronic kidney disease with heart failure and stage 1 through stage 4 chronic kidney disease, or unspecified chronic kidney disease: Secondary | ICD-10-CM | POA: Diagnosis not present

## 2023-04-30 DIAGNOSIS — M25572 Pain in left ankle and joints of left foot: Secondary | ICD-10-CM | POA: Diagnosis not present

## 2023-04-30 DIAGNOSIS — E538 Deficiency of other specified B group vitamins: Secondary | ICD-10-CM | POA: Diagnosis not present

## 2023-04-30 DIAGNOSIS — Z993 Dependence on wheelchair: Secondary | ICD-10-CM | POA: Diagnosis not present

## 2023-04-30 DIAGNOSIS — M24541 Contracture, right hand: Secondary | ICD-10-CM | POA: Diagnosis not present

## 2023-04-30 DIAGNOSIS — Z8541 Personal history of malignant neoplasm of cervix uteri: Secondary | ICD-10-CM | POA: Diagnosis not present

## 2023-04-30 DIAGNOSIS — I2510 Atherosclerotic heart disease of native coronary artery without angina pectoris: Secondary | ICD-10-CM | POA: Diagnosis not present

## 2023-04-30 DIAGNOSIS — M0800 Unspecified juvenile rheumatoid arthritis of unspecified site: Secondary | ICD-10-CM | POA: Diagnosis not present

## 2023-04-30 DIAGNOSIS — G8929 Other chronic pain: Secondary | ICD-10-CM | POA: Diagnosis not present

## 2023-04-30 DIAGNOSIS — M436 Torticollis: Secondary | ICD-10-CM | POA: Diagnosis not present

## 2023-04-30 DIAGNOSIS — N1832 Chronic kidney disease, stage 3b: Secondary | ICD-10-CM | POA: Diagnosis not present

## 2023-05-02 DIAGNOSIS — Z8616 Personal history of COVID-19: Secondary | ICD-10-CM | POA: Diagnosis not present

## 2023-05-02 DIAGNOSIS — D631 Anemia in chronic kidney disease: Secondary | ICD-10-CM | POA: Diagnosis not present

## 2023-05-02 DIAGNOSIS — M25572 Pain in left ankle and joints of left foot: Secondary | ICD-10-CM | POA: Diagnosis not present

## 2023-05-02 DIAGNOSIS — Z993 Dependence on wheelchair: Secondary | ICD-10-CM | POA: Diagnosis not present

## 2023-05-02 DIAGNOSIS — N1832 Chronic kidney disease, stage 3b: Secondary | ICD-10-CM | POA: Diagnosis not present

## 2023-05-02 DIAGNOSIS — M0800 Unspecified juvenile rheumatoid arthritis of unspecified site: Secondary | ICD-10-CM | POA: Diagnosis not present

## 2023-05-02 DIAGNOSIS — I130 Hypertensive heart and chronic kidney disease with heart failure and stage 1 through stage 4 chronic kidney disease, or unspecified chronic kidney disease: Secondary | ICD-10-CM | POA: Diagnosis not present

## 2023-05-02 DIAGNOSIS — K529 Noninfective gastroenteritis and colitis, unspecified: Secondary | ICD-10-CM | POA: Diagnosis not present

## 2023-05-02 DIAGNOSIS — Z8541 Personal history of malignant neoplasm of cervix uteri: Secondary | ICD-10-CM | POA: Diagnosis not present

## 2023-05-02 DIAGNOSIS — N132 Hydronephrosis with renal and ureteral calculous obstruction: Secondary | ICD-10-CM | POA: Diagnosis not present

## 2023-05-02 DIAGNOSIS — G8929 Other chronic pain: Secondary | ICD-10-CM | POA: Diagnosis not present

## 2023-05-02 DIAGNOSIS — Z96641 Presence of right artificial hip joint: Secondary | ICD-10-CM | POA: Diagnosis not present

## 2023-05-02 DIAGNOSIS — M436 Torticollis: Secondary | ICD-10-CM | POA: Diagnosis not present

## 2023-05-02 DIAGNOSIS — I2510 Atherosclerotic heart disease of native coronary artery without angina pectoris: Secondary | ICD-10-CM | POA: Diagnosis not present

## 2023-05-02 DIAGNOSIS — E538 Deficiency of other specified B group vitamins: Secondary | ICD-10-CM | POA: Diagnosis not present

## 2023-05-02 DIAGNOSIS — M24542 Contracture, left hand: Secondary | ICD-10-CM | POA: Diagnosis not present

## 2023-05-02 DIAGNOSIS — E559 Vitamin D deficiency, unspecified: Secondary | ICD-10-CM | POA: Diagnosis not present

## 2023-05-02 DIAGNOSIS — M24541 Contracture, right hand: Secondary | ICD-10-CM | POA: Diagnosis not present

## 2023-05-02 DIAGNOSIS — I5030 Unspecified diastolic (congestive) heart failure: Secondary | ICD-10-CM | POA: Diagnosis not present

## 2023-05-07 DIAGNOSIS — I5030 Unspecified diastolic (congestive) heart failure: Secondary | ICD-10-CM | POA: Diagnosis not present

## 2023-05-07 DIAGNOSIS — I130 Hypertensive heart and chronic kidney disease with heart failure and stage 1 through stage 4 chronic kidney disease, or unspecified chronic kidney disease: Secondary | ICD-10-CM | POA: Diagnosis not present

## 2023-05-07 DIAGNOSIS — I2510 Atherosclerotic heart disease of native coronary artery without angina pectoris: Secondary | ICD-10-CM | POA: Diagnosis not present

## 2023-05-07 DIAGNOSIS — Z8541 Personal history of malignant neoplasm of cervix uteri: Secondary | ICD-10-CM | POA: Diagnosis not present

## 2023-05-07 DIAGNOSIS — K529 Noninfective gastroenteritis and colitis, unspecified: Secondary | ICD-10-CM | POA: Diagnosis not present

## 2023-05-07 DIAGNOSIS — M25572 Pain in left ankle and joints of left foot: Secondary | ICD-10-CM | POA: Diagnosis not present

## 2023-05-07 DIAGNOSIS — E559 Vitamin D deficiency, unspecified: Secondary | ICD-10-CM | POA: Diagnosis not present

## 2023-05-07 DIAGNOSIS — N1832 Chronic kidney disease, stage 3b: Secondary | ICD-10-CM | POA: Diagnosis not present

## 2023-05-07 DIAGNOSIS — Z96641 Presence of right artificial hip joint: Secondary | ICD-10-CM | POA: Diagnosis not present

## 2023-05-07 DIAGNOSIS — N132 Hydronephrosis with renal and ureteral calculous obstruction: Secondary | ICD-10-CM | POA: Diagnosis not present

## 2023-05-07 DIAGNOSIS — M24541 Contracture, right hand: Secondary | ICD-10-CM | POA: Diagnosis not present

## 2023-05-07 DIAGNOSIS — G8929 Other chronic pain: Secondary | ICD-10-CM | POA: Diagnosis not present

## 2023-05-07 DIAGNOSIS — E538 Deficiency of other specified B group vitamins: Secondary | ICD-10-CM | POA: Diagnosis not present

## 2023-05-07 DIAGNOSIS — Z8616 Personal history of COVID-19: Secondary | ICD-10-CM | POA: Diagnosis not present

## 2023-05-07 DIAGNOSIS — M0800 Unspecified juvenile rheumatoid arthritis of unspecified site: Secondary | ICD-10-CM | POA: Diagnosis not present

## 2023-05-07 DIAGNOSIS — D631 Anemia in chronic kidney disease: Secondary | ICD-10-CM | POA: Diagnosis not present

## 2023-05-07 DIAGNOSIS — R197 Diarrhea, unspecified: Secondary | ICD-10-CM | POA: Diagnosis not present

## 2023-05-07 DIAGNOSIS — Z993 Dependence on wheelchair: Secondary | ICD-10-CM | POA: Diagnosis not present

## 2023-05-07 DIAGNOSIS — M24542 Contracture, left hand: Secondary | ICD-10-CM | POA: Diagnosis not present

## 2023-05-07 DIAGNOSIS — M436 Torticollis: Secondary | ICD-10-CM | POA: Diagnosis not present

## 2023-05-09 DIAGNOSIS — G8929 Other chronic pain: Secondary | ICD-10-CM | POA: Diagnosis not present

## 2023-05-09 DIAGNOSIS — M436 Torticollis: Secondary | ICD-10-CM | POA: Diagnosis not present

## 2023-05-09 DIAGNOSIS — I5030 Unspecified diastolic (congestive) heart failure: Secondary | ICD-10-CM | POA: Diagnosis not present

## 2023-05-09 DIAGNOSIS — K529 Noninfective gastroenteritis and colitis, unspecified: Secondary | ICD-10-CM | POA: Diagnosis not present

## 2023-05-09 DIAGNOSIS — M25572 Pain in left ankle and joints of left foot: Secondary | ICD-10-CM | POA: Diagnosis not present

## 2023-05-09 DIAGNOSIS — M24541 Contracture, right hand: Secondary | ICD-10-CM | POA: Diagnosis not present

## 2023-05-09 DIAGNOSIS — Z8541 Personal history of malignant neoplasm of cervix uteri: Secondary | ICD-10-CM | POA: Diagnosis not present

## 2023-05-09 DIAGNOSIS — D631 Anemia in chronic kidney disease: Secondary | ICD-10-CM | POA: Diagnosis not present

## 2023-05-09 DIAGNOSIS — Z8616 Personal history of COVID-19: Secondary | ICD-10-CM | POA: Diagnosis not present

## 2023-05-09 DIAGNOSIS — I130 Hypertensive heart and chronic kidney disease with heart failure and stage 1 through stage 4 chronic kidney disease, or unspecified chronic kidney disease: Secondary | ICD-10-CM | POA: Diagnosis not present

## 2023-05-09 DIAGNOSIS — N1832 Chronic kidney disease, stage 3b: Secondary | ICD-10-CM | POA: Diagnosis not present

## 2023-05-09 DIAGNOSIS — M0800 Unspecified juvenile rheumatoid arthritis of unspecified site: Secondary | ICD-10-CM | POA: Diagnosis not present

## 2023-05-09 DIAGNOSIS — E538 Deficiency of other specified B group vitamins: Secondary | ICD-10-CM | POA: Diagnosis not present

## 2023-05-09 DIAGNOSIS — E559 Vitamin D deficiency, unspecified: Secondary | ICD-10-CM | POA: Diagnosis not present

## 2023-05-09 DIAGNOSIS — I2510 Atherosclerotic heart disease of native coronary artery without angina pectoris: Secondary | ICD-10-CM | POA: Diagnosis not present

## 2023-05-09 DIAGNOSIS — Z96641 Presence of right artificial hip joint: Secondary | ICD-10-CM | POA: Diagnosis not present

## 2023-05-09 DIAGNOSIS — Z993 Dependence on wheelchair: Secondary | ICD-10-CM | POA: Diagnosis not present

## 2023-05-09 DIAGNOSIS — N132 Hydronephrosis with renal and ureteral calculous obstruction: Secondary | ICD-10-CM | POA: Diagnosis not present

## 2023-05-09 DIAGNOSIS — M24542 Contracture, left hand: Secondary | ICD-10-CM | POA: Diagnosis not present

## 2023-05-14 DIAGNOSIS — Z8616 Personal history of COVID-19: Secondary | ICD-10-CM | POA: Diagnosis not present

## 2023-05-14 DIAGNOSIS — K529 Noninfective gastroenteritis and colitis, unspecified: Secondary | ICD-10-CM | POA: Diagnosis not present

## 2023-05-14 DIAGNOSIS — I5030 Unspecified diastolic (congestive) heart failure: Secondary | ICD-10-CM | POA: Diagnosis not present

## 2023-05-14 DIAGNOSIS — M24542 Contracture, left hand: Secondary | ICD-10-CM | POA: Diagnosis not present

## 2023-05-14 DIAGNOSIS — E559 Vitamin D deficiency, unspecified: Secondary | ICD-10-CM | POA: Diagnosis not present

## 2023-05-14 DIAGNOSIS — D631 Anemia in chronic kidney disease: Secondary | ICD-10-CM | POA: Diagnosis not present

## 2023-05-14 DIAGNOSIS — M436 Torticollis: Secondary | ICD-10-CM | POA: Diagnosis not present

## 2023-05-14 DIAGNOSIS — M25572 Pain in left ankle and joints of left foot: Secondary | ICD-10-CM | POA: Diagnosis not present

## 2023-05-14 DIAGNOSIS — I2510 Atherosclerotic heart disease of native coronary artery without angina pectoris: Secondary | ICD-10-CM | POA: Diagnosis not present

## 2023-05-14 DIAGNOSIS — N132 Hydronephrosis with renal and ureteral calculous obstruction: Secondary | ICD-10-CM | POA: Diagnosis not present

## 2023-05-14 DIAGNOSIS — N1832 Chronic kidney disease, stage 3b: Secondary | ICD-10-CM | POA: Diagnosis not present

## 2023-05-14 DIAGNOSIS — Z8541 Personal history of malignant neoplasm of cervix uteri: Secondary | ICD-10-CM | POA: Diagnosis not present

## 2023-05-14 DIAGNOSIS — M0800 Unspecified juvenile rheumatoid arthritis of unspecified site: Secondary | ICD-10-CM | POA: Diagnosis not present

## 2023-05-14 DIAGNOSIS — E538 Deficiency of other specified B group vitamins: Secondary | ICD-10-CM | POA: Diagnosis not present

## 2023-05-14 DIAGNOSIS — M24541 Contracture, right hand: Secondary | ICD-10-CM | POA: Diagnosis not present

## 2023-05-14 DIAGNOSIS — Z993 Dependence on wheelchair: Secondary | ICD-10-CM | POA: Diagnosis not present

## 2023-05-14 DIAGNOSIS — G8929 Other chronic pain: Secondary | ICD-10-CM | POA: Diagnosis not present

## 2023-05-14 DIAGNOSIS — I130 Hypertensive heart and chronic kidney disease with heart failure and stage 1 through stage 4 chronic kidney disease, or unspecified chronic kidney disease: Secondary | ICD-10-CM | POA: Diagnosis not present

## 2023-05-14 DIAGNOSIS — Z96641 Presence of right artificial hip joint: Secondary | ICD-10-CM | POA: Diagnosis not present

## 2023-05-15 DIAGNOSIS — M24542 Contracture, left hand: Secondary | ICD-10-CM | POA: Diagnosis not present

## 2023-05-15 DIAGNOSIS — N132 Hydronephrosis with renal and ureteral calculous obstruction: Secondary | ICD-10-CM | POA: Diagnosis not present

## 2023-05-15 DIAGNOSIS — Z8616 Personal history of COVID-19: Secondary | ICD-10-CM | POA: Diagnosis not present

## 2023-05-15 DIAGNOSIS — I5030 Unspecified diastolic (congestive) heart failure: Secondary | ICD-10-CM | POA: Diagnosis not present

## 2023-05-15 DIAGNOSIS — N1832 Chronic kidney disease, stage 3b: Secondary | ICD-10-CM | POA: Diagnosis not present

## 2023-05-15 DIAGNOSIS — M25572 Pain in left ankle and joints of left foot: Secondary | ICD-10-CM | POA: Diagnosis not present

## 2023-05-15 DIAGNOSIS — M24541 Contracture, right hand: Secondary | ICD-10-CM | POA: Diagnosis not present

## 2023-05-15 DIAGNOSIS — Z993 Dependence on wheelchair: Secondary | ICD-10-CM | POA: Diagnosis not present

## 2023-05-15 DIAGNOSIS — Z8541 Personal history of malignant neoplasm of cervix uteri: Secondary | ICD-10-CM | POA: Diagnosis not present

## 2023-05-15 DIAGNOSIS — K529 Noninfective gastroenteritis and colitis, unspecified: Secondary | ICD-10-CM | POA: Diagnosis not present

## 2023-05-15 DIAGNOSIS — Z96641 Presence of right artificial hip joint: Secondary | ICD-10-CM | POA: Diagnosis not present

## 2023-05-15 DIAGNOSIS — E538 Deficiency of other specified B group vitamins: Secondary | ICD-10-CM | POA: Diagnosis not present

## 2023-05-15 DIAGNOSIS — I130 Hypertensive heart and chronic kidney disease with heart failure and stage 1 through stage 4 chronic kidney disease, or unspecified chronic kidney disease: Secondary | ICD-10-CM | POA: Diagnosis not present

## 2023-05-15 DIAGNOSIS — M436 Torticollis: Secondary | ICD-10-CM | POA: Diagnosis not present

## 2023-05-15 DIAGNOSIS — D631 Anemia in chronic kidney disease: Secondary | ICD-10-CM | POA: Diagnosis not present

## 2023-05-15 DIAGNOSIS — I2510 Atherosclerotic heart disease of native coronary artery without angina pectoris: Secondary | ICD-10-CM | POA: Diagnosis not present

## 2023-05-15 DIAGNOSIS — M0800 Unspecified juvenile rheumatoid arthritis of unspecified site: Secondary | ICD-10-CM | POA: Diagnosis not present

## 2023-05-15 DIAGNOSIS — E559 Vitamin D deficiency, unspecified: Secondary | ICD-10-CM | POA: Diagnosis not present

## 2023-05-15 DIAGNOSIS — G8929 Other chronic pain: Secondary | ICD-10-CM | POA: Diagnosis not present

## 2023-05-16 DIAGNOSIS — I5030 Unspecified diastolic (congestive) heart failure: Secondary | ICD-10-CM | POA: Diagnosis not present

## 2023-05-16 DIAGNOSIS — G8929 Other chronic pain: Secondary | ICD-10-CM | POA: Diagnosis not present

## 2023-05-16 DIAGNOSIS — M24541 Contracture, right hand: Secondary | ICD-10-CM | POA: Diagnosis not present

## 2023-05-16 DIAGNOSIS — E559 Vitamin D deficiency, unspecified: Secondary | ICD-10-CM | POA: Diagnosis not present

## 2023-05-16 DIAGNOSIS — I2510 Atherosclerotic heart disease of native coronary artery without angina pectoris: Secondary | ICD-10-CM | POA: Diagnosis not present

## 2023-05-16 DIAGNOSIS — M436 Torticollis: Secondary | ICD-10-CM | POA: Diagnosis not present

## 2023-05-16 DIAGNOSIS — D631 Anemia in chronic kidney disease: Secondary | ICD-10-CM | POA: Diagnosis not present

## 2023-05-16 DIAGNOSIS — Z8616 Personal history of COVID-19: Secondary | ICD-10-CM | POA: Diagnosis not present

## 2023-05-16 DIAGNOSIS — M25572 Pain in left ankle and joints of left foot: Secondary | ICD-10-CM | POA: Diagnosis not present

## 2023-05-16 DIAGNOSIS — M24542 Contracture, left hand: Secondary | ICD-10-CM | POA: Diagnosis not present

## 2023-05-16 DIAGNOSIS — N1832 Chronic kidney disease, stage 3b: Secondary | ICD-10-CM | POA: Diagnosis not present

## 2023-05-16 DIAGNOSIS — K529 Noninfective gastroenteritis and colitis, unspecified: Secondary | ICD-10-CM | POA: Diagnosis not present

## 2023-05-16 DIAGNOSIS — M0800 Unspecified juvenile rheumatoid arthritis of unspecified site: Secondary | ICD-10-CM | POA: Diagnosis not present

## 2023-05-16 DIAGNOSIS — I130 Hypertensive heart and chronic kidney disease with heart failure and stage 1 through stage 4 chronic kidney disease, or unspecified chronic kidney disease: Secondary | ICD-10-CM | POA: Diagnosis not present

## 2023-05-16 DIAGNOSIS — Z96641 Presence of right artificial hip joint: Secondary | ICD-10-CM | POA: Diagnosis not present

## 2023-05-16 DIAGNOSIS — Z8541 Personal history of malignant neoplasm of cervix uteri: Secondary | ICD-10-CM | POA: Diagnosis not present

## 2023-05-16 DIAGNOSIS — E538 Deficiency of other specified B group vitamins: Secondary | ICD-10-CM | POA: Diagnosis not present

## 2023-05-16 DIAGNOSIS — Z993 Dependence on wheelchair: Secondary | ICD-10-CM | POA: Diagnosis not present

## 2023-05-16 DIAGNOSIS — N132 Hydronephrosis with renal and ureteral calculous obstruction: Secondary | ICD-10-CM | POA: Diagnosis not present

## 2023-05-21 DIAGNOSIS — I5030 Unspecified diastolic (congestive) heart failure: Secondary | ICD-10-CM | POA: Diagnosis not present

## 2023-05-21 DIAGNOSIS — Z8541 Personal history of malignant neoplasm of cervix uteri: Secondary | ICD-10-CM | POA: Diagnosis not present

## 2023-05-21 DIAGNOSIS — G8929 Other chronic pain: Secondary | ICD-10-CM | POA: Diagnosis not present

## 2023-05-21 DIAGNOSIS — N132 Hydronephrosis with renal and ureteral calculous obstruction: Secondary | ICD-10-CM | POA: Diagnosis not present

## 2023-05-21 DIAGNOSIS — M24541 Contracture, right hand: Secondary | ICD-10-CM | POA: Diagnosis not present

## 2023-05-21 DIAGNOSIS — Z96641 Presence of right artificial hip joint: Secondary | ICD-10-CM | POA: Diagnosis not present

## 2023-05-21 DIAGNOSIS — N1832 Chronic kidney disease, stage 3b: Secondary | ICD-10-CM | POA: Diagnosis not present

## 2023-05-21 DIAGNOSIS — M0800 Unspecified juvenile rheumatoid arthritis of unspecified site: Secondary | ICD-10-CM | POA: Diagnosis not present

## 2023-05-21 DIAGNOSIS — I2510 Atherosclerotic heart disease of native coronary artery without angina pectoris: Secondary | ICD-10-CM | POA: Diagnosis not present

## 2023-05-21 DIAGNOSIS — I130 Hypertensive heart and chronic kidney disease with heart failure and stage 1 through stage 4 chronic kidney disease, or unspecified chronic kidney disease: Secondary | ICD-10-CM | POA: Diagnosis not present

## 2023-05-21 DIAGNOSIS — M436 Torticollis: Secondary | ICD-10-CM | POA: Diagnosis not present

## 2023-05-21 DIAGNOSIS — K529 Noninfective gastroenteritis and colitis, unspecified: Secondary | ICD-10-CM | POA: Diagnosis not present

## 2023-05-21 DIAGNOSIS — E538 Deficiency of other specified B group vitamins: Secondary | ICD-10-CM | POA: Diagnosis not present

## 2023-05-21 DIAGNOSIS — D631 Anemia in chronic kidney disease: Secondary | ICD-10-CM | POA: Diagnosis not present

## 2023-05-21 DIAGNOSIS — Z993 Dependence on wheelchair: Secondary | ICD-10-CM | POA: Diagnosis not present

## 2023-05-21 DIAGNOSIS — Z8616 Personal history of COVID-19: Secondary | ICD-10-CM | POA: Diagnosis not present

## 2023-05-21 DIAGNOSIS — M25572 Pain in left ankle and joints of left foot: Secondary | ICD-10-CM | POA: Diagnosis not present

## 2023-05-21 DIAGNOSIS — E559 Vitamin D deficiency, unspecified: Secondary | ICD-10-CM | POA: Diagnosis not present

## 2023-05-21 DIAGNOSIS — M24542 Contracture, left hand: Secondary | ICD-10-CM | POA: Diagnosis not present

## 2023-05-25 ENCOUNTER — Inpatient Hospital Stay
Admission: EM | Admit: 2023-05-25 | Discharge: 2023-06-02 | DRG: 871 | Disposition: A | Discharge status: Home Health | Payer: Medicare HMO | Attending: Internal Medicine | Admitting: Internal Medicine

## 2023-05-25 ENCOUNTER — Encounter: Payer: Self-pay | Admitting: Radiology

## 2023-05-25 ENCOUNTER — Emergency Department: Payer: Medicare HMO

## 2023-05-25 DIAGNOSIS — Z803 Family history of malignant neoplasm of breast: Secondary | ICD-10-CM

## 2023-05-25 DIAGNOSIS — Z8541 Personal history of malignant neoplasm of cervix uteri: Secondary | ICD-10-CM

## 2023-05-25 DIAGNOSIS — M0800 Unspecified juvenile rheumatoid arthritis of unspecified site: Secondary | ICD-10-CM | POA: Diagnosis present

## 2023-05-25 DIAGNOSIS — Z87440 Personal history of urinary (tract) infections: Secondary | ICD-10-CM

## 2023-05-25 DIAGNOSIS — I129 Hypertensive chronic kidney disease with stage 1 through stage 4 chronic kidney disease, or unspecified chronic kidney disease: Secondary | ICD-10-CM | POA: Diagnosis present

## 2023-05-25 DIAGNOSIS — A414 Sepsis due to anaerobes: Principal | ICD-10-CM | POA: Diagnosis present

## 2023-05-25 DIAGNOSIS — Z87442 Personal history of urinary calculi: Secondary | ICD-10-CM

## 2023-05-25 DIAGNOSIS — A0471 Enterocolitis due to Clostridium difficile, recurrent: Secondary | ICD-10-CM | POA: Diagnosis present

## 2023-05-25 DIAGNOSIS — R197 Diarrhea, unspecified: Principal | ICD-10-CM

## 2023-05-25 DIAGNOSIS — Z79899 Other long term (current) drug therapy: Secondary | ICD-10-CM

## 2023-05-25 DIAGNOSIS — Z885 Allergy status to narcotic agent status: Secondary | ICD-10-CM

## 2023-05-25 DIAGNOSIS — N390 Urinary tract infection, site not specified: Secondary | ICD-10-CM | POA: Diagnosis present

## 2023-05-25 DIAGNOSIS — Z8249 Family history of ischemic heart disease and other diseases of the circulatory system: Secondary | ICD-10-CM

## 2023-05-25 DIAGNOSIS — Z881 Allergy status to other antibiotic agents status: Secondary | ICD-10-CM

## 2023-05-25 DIAGNOSIS — N136 Pyonephrosis: Secondary | ICD-10-CM | POA: Diagnosis present

## 2023-05-25 DIAGNOSIS — B961 Klebsiella pneumoniae [K. pneumoniae] as the cause of diseases classified elsewhere: Secondary | ICD-10-CM | POA: Diagnosis present

## 2023-05-25 DIAGNOSIS — E876 Hypokalemia: Secondary | ICD-10-CM | POA: Diagnosis present

## 2023-05-25 DIAGNOSIS — I959 Hypotension, unspecified: Secondary | ICD-10-CM | POA: Diagnosis present

## 2023-05-25 DIAGNOSIS — Z87891 Personal history of nicotine dependence: Secondary | ICD-10-CM

## 2023-05-25 DIAGNOSIS — R110 Nausea: Secondary | ICD-10-CM | POA: Diagnosis not present

## 2023-05-25 DIAGNOSIS — E872 Acidosis, unspecified: Secondary | ICD-10-CM | POA: Diagnosis present

## 2023-05-25 DIAGNOSIS — Z96651 Presence of right artificial knee joint: Secondary | ICD-10-CM | POA: Diagnosis present

## 2023-05-25 DIAGNOSIS — A419 Sepsis, unspecified organism: Secondary | ICD-10-CM | POA: Diagnosis present

## 2023-05-25 DIAGNOSIS — N1832 Chronic kidney disease, stage 3b: Secondary | ICD-10-CM | POA: Diagnosis present

## 2023-05-25 DIAGNOSIS — Z90710 Acquired absence of both cervix and uterus: Secondary | ICD-10-CM | POA: Diagnosis not present

## 2023-05-25 DIAGNOSIS — L89891 Pressure ulcer of other site, stage 1: Secondary | ICD-10-CM | POA: Diagnosis present

## 2023-05-25 DIAGNOSIS — A0472 Enterocolitis due to Clostridium difficile, not specified as recurrent: Secondary | ICD-10-CM | POA: Diagnosis present

## 2023-05-25 DIAGNOSIS — Z880 Allergy status to penicillin: Secondary | ICD-10-CM

## 2023-05-25 DIAGNOSIS — Z7401 Bed confinement status: Secondary | ICD-10-CM

## 2023-05-25 DIAGNOSIS — R6521 Severe sepsis with septic shock: Secondary | ICD-10-CM | POA: Diagnosis present

## 2023-05-25 DIAGNOSIS — Z96643 Presence of artificial hip joint, bilateral: Secondary | ICD-10-CM | POA: Diagnosis present

## 2023-05-25 DIAGNOSIS — Z8616 Personal history of COVID-19: Secondary | ICD-10-CM

## 2023-05-25 DIAGNOSIS — B9561 Methicillin susceptible Staphylococcus aureus infection as the cause of diseases classified elsewhere: Secondary | ICD-10-CM | POA: Diagnosis present

## 2023-05-25 DIAGNOSIS — R450 Nervousness: Secondary | ICD-10-CM | POA: Diagnosis not present

## 2023-05-25 DIAGNOSIS — R1084 Generalized abdominal pain: Secondary | ICD-10-CM | POA: Diagnosis not present

## 2023-05-25 DIAGNOSIS — Z743 Need for continuous supervision: Secondary | ICD-10-CM | POA: Diagnosis not present

## 2023-05-25 DIAGNOSIS — Z809 Family history of malignant neoplasm, unspecified: Secondary | ICD-10-CM

## 2023-05-25 LAB — LIPASE, BLOOD: Lipase: 15 U/L (ref 11–51)

## 2023-05-25 LAB — COMPREHENSIVE METABOLIC PANEL
ALT: 11 U/L (ref 0–44)
AST: 15 U/L (ref 15–41)
Albumin: 2 g/dL — ABNORMAL LOW (ref 3.5–5.0)
Alkaline Phosphatase: 91 U/L (ref 38–126)
Anion gap: 11 (ref 5–15)
BUN: 17 mg/dL (ref 8–23)
CO2: 18 mmol/L — ABNORMAL LOW (ref 22–32)
Calcium: 7.6 mg/dL — ABNORMAL LOW (ref 8.9–10.3)
Chloride: 106 mmol/L (ref 98–111)
Creatinine, Ser: 1.03 mg/dL — ABNORMAL HIGH (ref 0.44–1.00)
GFR, Estimated: 58 mL/min — ABNORMAL LOW (ref >60)
Glucose, Bld: 100 mg/dL — ABNORMAL HIGH (ref 70–99)
Potassium: 3.6 mmol/L (ref 3.5–5.1)
Sodium: 135 mmol/L (ref 135–145)
Total Bilirubin: 0.7 mg/dL (ref 0.3–1.2)
Total Protein: 4.9 g/dL — ABNORMAL LOW (ref 6.5–8.1)

## 2023-05-25 LAB — CBC
HCT: 39.5 % (ref 36.0–46.0)
Hemoglobin: 12.6 g/dL (ref 12.0–15.0)
MCH: 28.2 pg (ref 26.0–34.0)
MCHC: 31.9 g/dL (ref 30.0–36.0)
MCV: 88.4 fL (ref 80.0–100.0)
Platelets: 306 10*3/uL (ref 150–400)
RBC: 4.47 MIL/uL (ref 3.87–5.11)
RDW: 14.2 % (ref 11.5–15.5)
WBC: 18.7 10*3/uL — ABNORMAL HIGH (ref 4.0–10.5)
nRBC: 0 % (ref 0.0–0.2)

## 2023-05-25 LAB — URINALYSIS, ROUTINE W REFLEX MICROSCOPIC
Bilirubin Urine: NEGATIVE
Glucose, UA: NEGATIVE mg/dL
Ketones, ur: NEGATIVE mg/dL
Nitrite: POSITIVE — AB
Protein, ur: 100 mg/dL — AB
Specific Gravity, Urine: 1.018 (ref 1.005–1.030)
WBC, UA: >50 WBC/hpf (ref 0–5)
pH: 5 (ref 5.0–8.0)

## 2023-05-25 LAB — C DIFFICILE QUICK SCREEN W PCR REFLEX
C Diff antigen: POSITIVE — AB
C Diff interpretation: DETECTED
C Diff toxin: POSITIVE — AB

## 2023-05-25 LAB — LACTIC ACID, PLASMA: Lactic Acid, Venous: 1.3 mmol/L (ref 0.5–1.9)

## 2023-05-25 MED ORDER — VANCOMYCIN HCL 250 MG PO CAPS
500.0000 mg | ORAL_CAPSULE | Freq: Four times a day (QID) | ORAL | Status: DC
  Administered 2023-05-25 – 2023-05-26 (×2): 500 mg via ORAL
  Filled 2023-05-25 (×3): qty 2

## 2023-05-25 MED ORDER — SODIUM CHLORIDE 0.9 % IV BOLUS
1000.0000 mL | Freq: Once | INTRAVENOUS | Status: AC
  Administered 2023-05-25: 1000 mL via INTRAVENOUS

## 2023-05-25 MED ORDER — GABAPENTIN 100 MG PO CAPS
100.0000 mg | ORAL_CAPSULE | Freq: Every day | ORAL | Status: DC
  Administered 2023-05-25 – 2023-06-01 (×8): 100 mg via ORAL
  Filled 2023-05-25 (×8): qty 1

## 2023-05-25 MED ORDER — METRONIDAZOLE 500 MG/100ML IV SOLN
500.0000 mg | Freq: Three times a day (TID) | INTRAVENOUS | Status: DC
  Administered 2023-05-26: 500 mg via INTRAVENOUS
  Filled 2023-05-25: qty 100

## 2023-05-25 MED ORDER — VANCOMYCIN HCL 250 MG PO CAPS
500.0000 mg | ORAL_CAPSULE | Freq: Four times a day (QID) | ORAL | Status: DC
  Filled 2023-05-25 (×2): qty 2

## 2023-05-25 MED ORDER — METRONIDAZOLE 500 MG/100ML IV SOLN
500.0000 mg | Freq: Three times a day (TID) | INTRAVENOUS | Status: DC
  Administered 2023-05-25: 500 mg via INTRAVENOUS
  Filled 2023-05-25: qty 100

## 2023-05-25 MED ORDER — SODIUM CHLORIDE 0.9 % IV SOLN
1.0000 g | Freq: Once | INTRAVENOUS | Status: AC
  Administered 2023-05-25: 1 g via INTRAVENOUS
  Filled 2023-05-25: qty 20

## 2023-05-25 MED ORDER — LACTATED RINGERS IV BOLUS
1000.0000 mL | Freq: Once | INTRAVENOUS | Status: AC
  Administered 2023-05-25: 1000 mL via INTRAVENOUS

## 2023-05-25 MED ORDER — FAMOTIDINE 20 MG PO TABS
40.0000 mg | ORAL_TABLET | Freq: Two times a day (BID) | ORAL | Status: DC
  Administered 2023-05-25 – 2023-05-27 (×4): 40 mg via ORAL
  Filled 2023-05-25 (×4): qty 2

## 2023-05-25 MED ORDER — SODIUM CHLORIDE 0.9 % IV SOLN
1.0000 g | Freq: Two times a day (BID) | INTRAVENOUS | Status: AC
  Administered 2023-05-26 – 2023-05-29 (×8): 1 g via INTRAVENOUS
  Filled 2023-05-25 (×9): qty 20

## 2023-05-25 MED ORDER — ONDANSETRON HCL 4 MG/2ML IJ SOLN
4.0000 mg | Freq: Once | INTRAMUSCULAR | Status: AC
  Administered 2023-05-25: 4 mg via INTRAVENOUS
  Filled 2023-05-25: qty 2

## 2023-05-25 MED ORDER — IOHEXOL 300 MG/ML  SOLN
75.0000 mL | Freq: Once | INTRAMUSCULAR | Status: AC | PRN
  Administered 2023-05-25: 75 mL via INTRAVENOUS

## 2023-05-25 MED ORDER — LACTATED RINGERS IV BOLUS: 1000.0000 mL | Freq: Once | INTRAVENOUS | Status: DC

## 2023-05-25 MED ORDER — OXYCODONE HCL 5 MG PO TABS
5.0000 mg | ORAL_TABLET | Freq: Four times a day (QID) | ORAL | Status: DC | PRN
  Administered 2023-05-25 – 2023-05-27 (×3): 5 mg via ORAL
  Filled 2023-05-25 (×3): qty 1

## 2023-05-25 MED ORDER — MORPHINE SULFATE (PF) 4 MG/ML IV SOLN
4.0000 mg | Freq: Once | INTRAVENOUS | Status: AC
  Administered 2023-05-25: 4 mg via INTRAVENOUS
  Filled 2023-05-25: qty 1

## 2023-05-25 MED ORDER — SODIUM CHLORIDE 0.9 % IV SOLN
1.0000 g | Freq: Once | INTRAVENOUS | Status: AC
  Administered 2023-05-25: 1 g via INTRAVENOUS
  Filled 2023-05-25: qty 10

## 2023-05-25 NOTE — Progress Notes (Signed)
Pharmacy Antibiotic Note  Jeanne [name] is a 72 y.o. female admitted on 05/25/2023 with UTI. Patient presenting with diarrhea and WBC 18.7. UA concerning for UTI and patient has a history of growing ESBL klebsiella in the urine. Pharmacy has been consulted for meropenem dosing.  Plan: Start meropenem 1 g IV every 12 hours based on current renal function Monitor renal function, clinical status, and LOT F/u urine culture and de-escalate as able  Weight: 65 kg (143 lb 6.4 oz)  Temp (24hrs), Avg:98.1 F (36.7 C), Min:97.9 F (36.6 C), Max:98.5 F (36.9 C)  Recent Labs  Lab 05/25/23 1211 05/25/23 1610 05/25/23 2022  WBC 18.7*  --   --   CREATININE  --  1.03*  --   LATICACIDVEN  --   --  1.3    Estimated Creatinine Clearance: 43.9 mL/min (A) (by C-G formula based on SCr of 1.03 mg/dL (H)).    Allergies  Allergen Reactions   Codeine Nausea Only and Nausea And Vomiting   Augmentin [Amoxicillin-Pot Clavulanate] Nausea Only    Very nauseated/ feels sore on body     Antimicrobials this admission: Ceftraixone 9/14 x1 Meropenem 9/14 >>  Metronidazole 9/14 >>  Dose adjustments this admission: N/A  Microbiology results: 9/14 BCx: pending 9/14 UCx: pending   Thank you for involving pharmacy in this patient's care.   Rockwell Alexandria, PharmD Clinical Pharmacist 05/25/2023 9:47 PM

## 2023-05-25 NOTE — ED Provider Notes (Signed)
[facility] Provider Note    Event Date/Time   First MD Initiated Contact with Patient 05/25/23 1427     (approximate)   History   Diarrhea   HPI  Jeanne [name] is a 72 y.o. female past medical history significant for prior episode of C. difficile who presents to the emergency department with diarrhea.  States that she is not been feeling well for the past couple of days.  Last night started having abdominal pain and diarrhea.  Multiple episodes of watery diarrhea that have been nonbloody today.  More than 10 episodes.  Denies any fever or chills.  Endorses nausea but episodes of vomiting.     Physical Exam   Triage Vital Signs: ED Triage Vitals  Encounter Vitals Group     BP 05/25/23 1208 112/67     Systolic BP Percentile --      Diastolic BP Percentile --      Pulse Rate 05/25/23 1208 (!) 117     Resp 05/25/23 1208 18     Temp 05/25/23 1208 98.5 F (36.9 C)     Temp Source 05/25/23 1208 Oral     SpO2 05/25/23 1208 96 %     Weight --      Height --      Head Circumference --      Peak Flow --      Pain Score 05/25/23 1209 0     Pain Loc --      Pain Education --      Exclude from Growth Chart --     Most recent vital signs: Vitals:   05/25/23 1208  BP: 112/67  Pulse: (!) 117  Resp: 18  Temp: 98.5 F (36.9 C)  SpO2: 96%    Physical Exam Constitutional:      Appearance: She is well-developed.  HENT:     Head: Atraumatic.  Eyes:     Conjunctiva/sclera: Conjunctivae normal.  Cardiovascular:     Rate and Rhythm: Regular rhythm. Tachycardia present.  Pulmonary:     Effort: No respiratory distress.  Abdominal:     General: There is no distension.     Tenderness: There is abdominal tenderness (Left lower quadrant abdominal tenderness to palpation).  Musculoskeletal:        General: Normal range of motion.     Cervical back: Normal range of motion.  Skin:    General: Skin is warm.     Capillary Refill: Capillary refill  takes less than 2 seconds.  Neurological:     Mental Status: She is alert. Mental status is at baseline.     IMPRESSION / MDM / ASSESSMENT AND PLAN / ED COURSE  I reviewed the triage vital signs and the nursing notes.  On chart review patient has a history of C. difficile and did not complete entire course of oral vancomycin.  Last admission was for colitis and tested negative for C. difficile.  Differential diagnosis including colitis, C. difficile, diverticulitis   Sinus tachycardia while on cardiac telemetry.   LABS (all labs ordered are listed, but only abnormal results are displayed) Labs interpreted as -    Labs Reviewed  CBC - Abnormal; Notable for the following components:      Result Value   WBC 18.7 (*)    All other components within normal limits  C DIFFICILE QUICK SCREEN W PCR REFLEX    GASTROINTESTINAL PANEL BY PCR, STOOL (REPLACES STOOL CULTURE)  URINALYSIS, ROUTINE W REFLEX MICROSCOPIC  COMPREHENSIVE METABOLIC PANEL  LIPASE, BLOOD     MDM  Leukocytosis.  GI pathogen panel and C. difficile ordered.  Given IV fluids, IV Zofran and IV morphine for pain control.  CT scan ordered.  Care transferred to incoming provider.     PROCEDURES:  Critical Care performed: No  Procedures  Patient's presentation is most consistent with acute presentation with potential threat to life or bodily function.   MEDICATIONS ORDERED IN ED: Medications  lactated ringers bolus 1,000 mL (1,000 mLs Intravenous New Bag/Given 05/25/23 1440)  ondansetron (ZOFRAN) injection 4 mg (4 mg Intravenous Given 05/25/23 1436)  morphine (PF) 4 MG/ML injection 4 mg (4 mg Intravenous Given 05/25/23 1444)    FINAL CLINICAL IMPRESSION(S) / ED DIAGNOSES   Final diagnoses:  Diarrhea, unspecified type     Rx / DC Orders   ED Discharge Orders     None        Note:  This document was prepared using Dragon voice recognition software and may include unintentional dictation errors.    Corena Herter, MD 05/25/23 [phone]

## 2023-05-25 NOTE — ED Notes (Signed)
Pt Cdiff positive per lab.  MD Para March notified.

## 2023-05-25 NOTE — ED Notes (Signed)
Pt with large, watery, yellow stool.  This RN cleaned pt, placed clean dry brief.  Pt resting in bed stating no further needs at this time.

## 2023-05-25 NOTE — H&P (Incomplete)
History and Physical    Patient: Jeanne [name] GNF:[id number] DOB: 1950/11/25 DOA: 05/25/2023 DOS: the patient was seen and examined on 05/25/2023 PCP: Jerl Mina, MD  Patient coming from: Home   Chief Complaint:  Chief Complaint  Patient presents with  . Diarrhea    HPI: Jeanne [name] is a 72 y.o. female with medical history significant for juvenile rheumatoid arthritis, hypertension, CKD, cervical cancer, C. Difficile  coming for diarrhea that started last night and x 10 episodes so came to ed. Pt also has nausea and vomiting.  In the emergency room patient is an ill-appearing weak appearing.  Patient is bedbound describes abdominal pain in the lower part of the abdomen pain and intermittent.  Now she is feeling better since coming to the hospital.  Shows me a picture with bowel movement which does not have any gross blood but he had this.  C. difficile initially was pending.  That admission was requested for diarrhea. Metabolic panel shows acidosis with bicarb of 18 anion gap of 11 calcium 7.6 creatinine 1.03 glucose 100 LFTs.  Lactic acid of 1.3 white count of 18.7 normal open of 2.6 and normal platelets of 306.  Urinalysis amber color moderate hemoglobin large leukocytes nitrite positive more than 50 WBCs and many bacteria.  Patient is positive for C. difficile antigen and C. difficile toxin.  Discussed with pharmacy about starting patient patient on C. difficile treatment protocol for fulminant disease and has a white count as above 15,000.  Patient's prognosis is fair due to the multiple recurrences.  I have asked pharmacy to discover how many episodes of C. difficile this patient is actually had, not sure if they have a way of detecting the positive stool tests.  Patient in the emergency room was given Rocephin for the UTI which I discontinued and started patient on Flagyl and Merrem.  Once that C. difficile test is positive patient will be continued on metronidazole and  vancomycin.   Review of Systems: Review of Systems  Constitutional:  Positive for malaise/fatigue.  Gastrointestinal:  Positive for abdominal pain and diarrhea.    Past Medical History:  Diagnosis Date  . Bedbound    since R hip surgery, cannot bear weight on R leg. since 07/02/22. Has not walked since then  . Cervical cancer (HCC)   . CKD stage 3b, GFR 30-44 ml/min (HCC)   . Complication of anesthesia   . History of kidney stones   . Hypertension   . PONV (postoperative nausea and vomiting)   . rheumatoidArthritis    RA   Past Surgical History:  Procedure Laterality Date  . ABDOMINAL HYSTERECTOMY  09/10/2003  . CYSTOGRAM N/A 08/04/2018   Procedure: CYSTOGRAM WITH URETHAL DILATION;  Surgeon: Vanna Scotland, MD;  Location: [facility] ORS;  Service: Urology;  Laterality: N/A;  . CYSTOSCOPY W/ RETROGRADES Bilateral 08/04/2018   Procedure: CYSTOSCOPY WITH RETROGRADE PYELOGRAM;  Surgeon: Vanna Scotland, MD;  Location: [facility] OR[facility];  Service: Urology;  Laterality: Bilateral;  . CYSTOSCOPY/URETEROSCOPY/HOLMIUM LASER/STENT PLACEMENT Left 08/04/2018   Procedure: CYSTOSCOPY/URETEROSCOPY/HOLMIUM LASER/STENT PLACEMENT;  Surgeon: Vanna Scotland, MD;  Location: [facility] OR[facility];  Service: Urology;  Laterality: Left;  . JOINT REPLACEMENT    . TOTAL HIP ARTHROPLASTY Right 1972  . TOTAL HIP ARTHROPLASTY Left 1981  . TOTAL KNEE ARTHROPLASTY Right 1990   Social History:   reports that she quit smoking about 33 years ago. Her smoking use included cigarettes. She started smoking about 53 years ago. She has never used smokeless tobacco. She reports  that she does not drink alcohol and does not use drugs.  Allergies  Allergen Reactions  . Codeine Nausea Only and Nausea And Vomiting  . Augmentin [Amoxicillin-Pot Clavulanate] Nausea Only    Very nauseated/ feels sore on body     Family History  Problem Relation Age of Onset  . Breast cancer Mother   . Hypertension Father   . Cancer Father   . Prostate  cancer Neg Hx   . Kidney cancer Neg Hx   . Bladder Cancer Neg Hx     Prior to Admission medications   Medication Sig Start Date End Date Taking? Authorizing Provider  gabapentin (NEURONTIN) 100 MG capsule Take 100 mg by mouth at bedtime. 10/16/22 10/16/23  [provider]  lidocaine-prilocaine (EMLA) cream APPLY SPARINGLY 2-3 TIMES A DAY TO AREA OF RADIATION THERAPY IRRITATION. 05/11/20   Bacigalupo, Marzella Schlein, MD  loperamide (IMODIUM) 2 MG capsule Take 1 capsule (2 mg total) by mouth every 4 (four) hours as needed for diarrhea or loose stools. 03/02/23   Wouk, Wilfred Curtis, MD  oxyCODONE (OXY IR/ROXICODONE) 5 MG immediate release tablet Take 1 tablet (5 mg total) by mouth every 6 (six) hours as needed. 01/19/23   Gillis Santa, MD  promethazine (PHENERGAN) 25 MG tablet Take 0.5 tablets (12.5 mg total) by mouth every 6 (six) hours as needed for up to 14 days for nausea or vomiting. 02/17/23 03/03/23  Charise Killian, MD  tretinoin (RETIN-A) 0.025 % cream Apply topically at bedtime.    [provider]     Vitals:   05/25/23 1635 05/25/23 1636 05/25/23 1739 05/25/23 1754  BP: (!) 86/57 (!) 92/54 (!) 91/50 (!) 93/55  Pulse: 97 97 97   Resp: 16  16   Temp:   98 F (36.7 C)   TempSrc:   Oral   SpO2: 97% 100% 100%    Physical Exam Vitals reviewed: bedbound.  Constitutional:      Appearance: She is ill-appearing.  HENT:     Head: Normocephalic and atraumatic.     Right Ear: External ear normal.     Left Ear: External ear normal.  Eyes:     Extraocular Movements: Extraocular movements intact.  Cardiovascular:     Rate and Rhythm: Normal rate and regular rhythm.     Heart sounds: Normal heart sounds.  Pulmonary:     Effort: Pulmonary effort is normal.     Breath sounds: Normal breath sounds.  Abdominal:     General: Bowel sounds are normal. There is no distension.     Palpations: Abdomen is soft. There is no mass.     Tenderness: There is abdominal tenderness. There is  no guarding.     Hernia: No hernia is present.  Skin:    General: Skin is warm and dry.  Neurological:     General: No focal deficit present.     Mental Status: She is alert and oriented to person, place, and time.     Cranial Nerves: Cranial nerves 2-12 are intact.     Motor: Weakness present.  Psychiatric:        Mood and Affect: Mood normal.        Behavior: Behavior normal.     Labs on Admission: I have personally reviewed following labs and imaging studies  CBC: Recent Labs  Lab 05/25/23 1211  WBC 18.7*  HGB 12.6  HCT 39.5  MCV 88.4  PLT 306   Basic Metabolic Panel: Recent Labs  Lab  05/25/23 1610  NA 135  K 3.6  CL 106  CO2 18*  GLUCOSE 100*  BUN 17  CREATININE 1.03*  CALCIUM 7.6*   GFR: CrCl cannot be calculated (Unknown ideal weight.). Liver Function Tests: Recent Labs  Lab 05/25/23 1610  AST 15  ALT 11  ALKPHOS 91  BILITOT 0.7  PROT 4.9*  ALBUMIN 2.0*   Recent Labs  Lab 05/25/23 1610  LIPASE 15   No results for input(s): "AMMONIA" in the last 168 hours. Coagulation Profile: No results for input(s): "INR", "PROTIME" in the last 168 hours. Cardiac Enzymes: No results for input(s): "CKTOTAL", "CKMB", "CKMBINDEX", "TROPONINI" in the last 168 hours. BNP (last 3 results) No results for input(s): "PROBNP" in the last 8760 hours. HbA1C: No results for input(s): "HGBA1C" in the last 72 hours. CBG: No results for input(s): "GLUCAP" in the last 168 hours. Lipid Profile: No results for input(s): "CHOL", "HDL", "LDLCALC", "TRIG", "CHOLHDL", "LDLDIRECT" in the last 72 hours. Thyroid Function Tests: No results for input(s): "TSH", "T4TOTAL", "FREET4", "T3FREE", "THYROIDAB" in the last 72 hours. Anemia Panel: No results for input(s): "VITAMINB12", "FOLATE", "FERRITIN", "TIBC", "IRON", "RETICCTPCT" in the last 72 hours. Urinalysis    Component Value Date/Time   COLORURINE AMBER (A) 05/25/2023 1620   APPEARANCEUR TURBID (A) 05/25/2023 1620    APPEARANCEUR Cloudy (A) 10/21/2018 0841   LABSPEC 1.018 05/25/2023 1620   PHURINE 5.0 05/25/2023 1620   GLUCOSEU NEGATIVE 05/25/2023 1620   HGBUR MODERATE (A) 05/25/2023 1620   BILIRUBINUR NEGATIVE 05/25/2023 1620   BILIRUBINUR negative 07/15/2020 0827   BILIRUBINUR Negative 10/21/2018 0841   KETONESUR NEGATIVE 05/25/2023 1620   PROTEINUR 100 (A) 05/25/2023 1620   UROBILINOGEN 0.2 07/15/2020 0827   NITRITE POSITIVE (A) 05/25/2023 1620   LEUKOCYTESUR LARGE (A) 05/25/2023 1620    Unresulted Labs (From admission, onward)     Start     Ordered   05/25/23 1652  Blood culture (routine x 2)  BLOOD CULTURE X 2,   STAT      05/25/23 1651   05/25/23 1652  Lactic acid, plasma  (Lactic Acid)  Now then every 2 hours,   STAT      05/25/23 1651   05/25/23 1639  Urine Culture  Add-on,   AD       Question:  Indication  Answer:  Dysuria   05/25/23 1640   05/25/23 1429  C Difficile Quick Screen w PCR reflex  (C Difficile quick screen w PCR reflex panel )  Once, for 24 hours,   URGENT       References:    CDiff Information Tool   05/25/23 1428   05/25/23 1429  Gastrointestinal Panel by PCR , Stool  (Gastrointestinal Panel by PCR, Stool                                                                                                                                                     **  Does Not include CLOSTRIDIUM DIFFICILE testing. **If CDIFF testing is needed, place order from the "C Difficile Testing" order set.**)  Once,   URGENT        05/25/23 1428            Medications  lactated ringers bolus 1,000 mL (has no administration in time range)  vancomycin (VANCOCIN) capsule 500 mg (has no administration in time range)  metroNIDAZOLE (FLAGYL) IVPB 500 mg (has no administration in time range)  famotidine (PEPCID) tablet 40 mg (has no administration in time range)  lactated ringers bolus 1,000 mL (0 mLs Intravenous Stopped 05/25/23 1654)  ondansetron (ZOFRAN) injection 4 mg (4 mg Intravenous Given  05/25/23 1436)  morphine (PF) 4 MG/ML injection 4 mg (4 mg Intravenous Given 05/25/23 1444)  cefTRIAXone (ROCEPHIN) 1 g in sodium chloride 0.9 % 100 mL IVPB (0 g Intravenous Stopped 05/25/23 1739)  sodium chloride 0.9 % bolus 1,000 mL (1,000 mLs Intravenous New Bag/Given 05/25/23 1654)  iohexol (OMNIPAQUE) 300 MG/ML solution 75 mL (75 mLs Intravenous Contrast Given 05/25/23 1722)  ondansetron (ZOFRAN) injection 4 mg (4 mg Intravenous Given 05/25/23 1747)    Radiological Exams on Admission: CT ABDOMEN PELVIS W CONTRAST  Result Date: 05/25/2023 CLINICAL DATA:  Pt arrives via [facility] from home for diarrhea that started yesterday. Pt states the diarrhea and abd pain are consistent with her previous diagnosis for C. Diff. Pt A+Ox4. Pt reports that she did not complete vancomycin from previous C. Diff infection. EXAM: CT ABDOMEN AND PELVIS WITH CONTRAST TECHNIQUE: Multidetector CT imaging of the abdomen and pelvis was performed using the standard protocol following bolus administration of intravenous contrast. RADIATION DOSE REDUCTION: This exam was performed according to the departmental dose-optimization program which includes automated exposure control, adjustment of the mA and/or kV according to patient size and/or use of iterative reconstruction technique. CONTRAST:  75mL OMNIPAQUE IOHEXOL 300 MG/ML  SOLN COMPARISON:  02/27/2023 FINDINGS: Lower chest: No acute abnormality. Hepatobiliary: Normal liver. Gallbladder distended with some increased attenuation material dependently suggesting small stones or sludge. No wall thickening or pericholecystic fluid. No duct dilation. Pancreas: Partial fatty replacement. No pancreatic mass or inflammation. Spleen: Top-normal in size.  No mass. Adrenals/Urinary Tract: No adrenal masses. Chronic bilateral hydronephrosis, greater on the left. Marked left renal atrophy. Moderate right renal cortical thinning. Right intrarenal stones. Mild dilation of the proximal to mid left ureter.  Increased enhancement of the left intrarenal and ureteral urothelium. Right ureter normal in course and in caliber. Bladder minimally distended, grossly unremarkable but well visualized due to artifact from bilateral hip prostheses. Kidney findings are stable from the prior CT. Stomach/Bowel: 6 new stomach mostly decompressed, otherwise unremarkable. Small bowel is normal in caliber. No wall thickening. No inflammation. Possible wall thickening of the lower sigmoid colon and rectum, which are nondistended. Portions of the sigmoid colon are not well visualized due to artifact from prostheses. Remainder of the colon is normal in caliber. Mild wall thickening. Vascular/Lymphatic: Minor aortic atherosclerosis. No aneurysm. No enlarged lymph nodes. Reproductive: Status post hysterectomy. No pelvic mass. Pelvic sidewall vascular clips. Vascular clips extend into the lower retroperitoneum. Other: No ascites. Musculoskeletal: Chronic vertebral fractures, T12, L1 and L5, unchanged. No acute fracture. No bone lesion. Bilateral total hip arthroplasties. Lucency adjacent to superior acetabular component the right, unchanged from the prior CT. There is fluid attenuation the soft tissue extending lateral from the right hip that has decreased in size compared to the prior exam. IMPRESSION: 1. Mild colonic wall thickening consistent  with the given history of C. difficile infection. No evidence of an abscess. No bowel obstruction. 2. No other acute abnormality within the abdomen or pelvis. 3. Multiple chronic abnormalities detailed above similar to the prior CT. Electronically Signed   By: Amie Portland M.D.   On: 05/25/2023 17:44     Data Reviewed: Relevant notes from primary care and specialist visits, past discharge summaries as available in EHR, including Care Everywhere. Prior diagnostic testing as pertinent to current admission diagnoses Updated medications and problem lists for reconciliation ED course, including  vitals, labs, imaging, treatment and response to treatment Triage notes, nursing and pharmacy notes and ED provider's notes Notable results as noted in HPI  Assessment and Plan: No notes have been filed under this hospital service. Service: Hospitalist       Prognosis: ***  DVT prophylaxis:  ***  Consults:  ***  Advance Care Planning:    Code Status: Prior   Family Communication:  ***  Disposition Plan:  ***  Severity of Illness: {Observation/Inpatient:21159}  Author: Gertha Calkin, MD 05/25/2023 7:49 PM  For on call review [email or web address].

## 2023-05-25 NOTE — ED Notes (Signed)
MD made aware of soft BP

## 2023-05-25 NOTE — H&P (Signed)
History and Physical    Patient: Jeanne [name]:[id number] DOB: April 12, 1951 DOA: 05/25/2023 DOS: the patient was seen and examined on 05/26/2023 PCP: Jerl Mina, MD  Patient coming from: Home   Chief Complaint:  Chief Complaint  Patient presents with   Diarrhea    HPI: Jeanne [name] is a 72 y.o. female with medical history significant for juvenile rheumatoid arthritis, hypertension, CKD, cervical cancer, C. Difficile  coming for diarrhea that started last night and x 10 episodes so came to ed. Pt also has nausea and vomiting.  In the emergency room patient is an ill-appearing weak appearing.  Patient is bedbound describes abdominal pain in the lower part of the abdomen pain and intermittent.  Now she is feeling better since coming to the hospital.  Shows me a picture with bowel movement which does not have any gross blood but he had this.  C. difficile initially was pending.  That admission was requested for diarrhea. Metabolic panel shows acidosis with bicarb of 18 anion gap of 11 calcium 7.6 creatinine 1.03 glucose 100 LFTs.  Lactic acid of 1.3 white count of 18.7 normal open of 2.6 and normal platelets of 306.  Urinalysis amber color moderate hemoglobin large leukocytes nitrite positive more than 50 WBCs and many bacteria.  Patient is positive for C. difficile antigen and C. difficile toxin.  Discussed with pharmacy about starting patient patient on C. difficile treatment protocol for fulminant disease and has a white count as above 15,000.  Patient's prognosis is fair due to the multiple recurrences.  I have asked pharmacy to discover how many episodes of C. difficile this patient is actually had, not sure if they have a way of detecting the positive stool tests.  Patient in the emergency room was given Rocephin for the UTI which I discontinued and started patient on Flagyl and Merrem.  Once that C. difficile test is positive patient will be continued on metronidazole and vancomycin.   Review of Systems: Review of Systems  Constitutional:  Positive for malaise/fatigue.  Gastrointestinal:  Positive for abdominal pain and diarrhea.   Past Medical History:  Diagnosis Date   Bedbound    since R hip surgery, cannot bear weight on R leg. since 07/02/22. Has not walked since then   Cervical cancer (HCC)    CKD stage 3b, GFR 30-44 ml/min (HCC)    Complication of anesthesia    History of kidney stones    Hypertension    PONV (postoperative nausea and vomiting)    rheumatoidArthritis    RA   Past Surgical History:  Procedure Laterality Date   ABDOMINAL HYSTERECTOMY  09/10/2003   CYSTOGRAM N/A 08/04/2018   Procedure: CYSTOGRAM WITH URETHAL DILATION;  Surgeon: Vanna Scotland, MD;  Location: [facility];  Service: Urology;  Laterality: N/A;   CYSTOSCOPY W/ RETROGRADES Bilateral 08/04/2018   Procedure: CYSTOSCOPY WITH RETROGRADE PYELOGRAM;  Surgeon: Vanna Scotland, MD;  Location: [facility];  Service: Urology;  Laterality: Bilateral;   CYSTOSCOPY/URETEROSCOPY/HOLMIUM LASER/STENT PLACEMENT Left 08/04/2018   Procedure: CYSTOSCOPY/URETEROSCOPY/HOLMIUM LASER/STENT PLACEMENT;  Surgeon: Vanna Scotland, MD;  Location: [facility];  Service: Urology;  Laterality: Left;   JOINT REPLACEMENT     TOTAL HIP ARTHROPLASTY Right 1972   TOTAL HIP ARTHROPLASTY Left 1981   TOTAL KNEE ARTHROPLASTY Right 1990   Social History:   reports that she quit smoking about 33 years ago. Her smoking use included cigarettes. She started smoking about 53 years ago. She has never used smokeless tobacco. She reports that she  does not drink alcohol and does not use drugs.  Allergies  Allergen Reactions   Codeine Nausea Only and Nausea And Vomiting   Augmentin [Amoxicillin-Pot Clavulanate] Nausea Only    Very nauseated/ feels sore on body    Ceftriaxone Other (See Comments)    "Pins and needles" sensation in legs    Family History  Problem Relation Age of Onset   Breast cancer Mother    Hypertension  Father    Cancer Father    Prostate cancer Neg Hx    Kidney cancer Neg Hx    Bladder Cancer Neg Hx     Prior to Admission medications   Medication Sig Start Date End Date Taking? Authorizing Provider  gabapentin (NEURONTIN) 100 MG capsule Take 100 mg by mouth at bedtime. 10/16/22 10/16/23  [provider]  lidocaine-prilocaine (EMLA) cream APPLY SPARINGLY 2-3 TIMES A DAY TO AREA OF RADIATION THERAPY IRRITATION. 05/11/20   Bacigalupo, Marzella Schlein, MD  loperamide (IMODIUM) 2 MG capsule Take 1 capsule (2 mg total) by mouth every 4 (four) hours as needed for diarrhea or loose stools. 03/02/23   Wouk, Wilfred Curtis, MD  oxyCODONE (OXY IR/ROXICODONE) 5 MG immediate release tablet Take 1 tablet (5 mg total) by mouth every 6 (six) hours as needed. 01/19/23   Gillis Santa, MD  promethazine (PHENERGAN) 25 MG tablet Take 0.5 tablets (12.5 mg total) by mouth every 6 (six) hours as needed for up to 14 days for nausea or vomiting. 02/17/23 03/03/23  Charise Killian, MD  tretinoin (RETIN-A) 0.025 % cream Apply topically at bedtime.    [provider]     Vitals:   05/26/23 0100 05/26/23 0130 05/26/23 0147 05/26/23 0202  BP: (!) 81/48 (!) 87/49  (!) 91/51  Pulse: 88 89  90  Resp:  19  16  Temp:   97.9 F (36.6 C)   TempSrc:   Oral   SpO2: 99% 98%  100%  Weight:       Physical Exam Vitals reviewed: bedbound.  Constitutional:      Appearance: She is ill-appearing.  HENT:     Head: Normocephalic and atraumatic.     Right Ear: External ear normal.     Left Ear: External ear normal.  Eyes:     Extraocular Movements: Extraocular movements intact.  Cardiovascular:     Rate and Rhythm: Normal rate and regular rhythm.     Pulses:          Dorsalis pedis pulses are 1+ on the right side and 1+ on the left side.       Posterior tibial pulses are 2+ on the right side and 2+ on the left side.     Heart sounds: Normal heart sounds.  Pulmonary:     Effort: Pulmonary effort is normal.      Breath sounds: Normal breath sounds.  Abdominal:     General: Bowel sounds are normal. There is no distension.     Palpations: Abdomen is soft. There is no mass.     Tenderness: There is abdominal tenderness. There is no guarding.     Hernia: No hernia is present.  Skin:    General: Skin is dry.     Capillary Refill: Capillary refill takes 2 to 3 seconds.     Coloration: Skin is pale.  Neurological:     General: No focal deficit present.     Mental Status: She is alert and oriented to person, place, and time.  Cranial Nerves: Cranial nerves 2-12 are intact.     Motor: Weakness present.  Psychiatric:        Mood and Affect: Mood normal.        Behavior: Behavior normal.     Labs on Admission: I have personally reviewed following labs and imaging studies  CBC: Recent Labs  Lab 05/25/23 1211  WBC 18.7*  HGB 12.6  HCT 39.5  MCV 88.4  PLT 306   Basic Metabolic Panel: Recent Labs  Lab 05/25/23 1610  NA 135  K 3.6  CL 106  CO2 18*  GLUCOSE 100*  BUN 17  CREATININE 1.03*  CALCIUM 7.6*   GFR: Estimated Creatinine Clearance: 43.9 mL/min (A) (by C-G formula based on SCr of 1.03 mg/dL (H)). Liver Function Tests: Recent Labs  Lab 05/25/23 1610  AST 15  ALT 11  ALKPHOS 91  BILITOT 0.7  PROT 4.9*  ALBUMIN 2.0*   Recent Labs  Lab 05/25/23 1610  LIPASE 15   No results for input(s): "AMMONIA" in the last 168 hours. Coagulation Profile: No results for input(s): "INR", "PROTIME" in the last 168 hours. Cardiac Enzymes: No results for input(s): "CKTOTAL", "CKMB", "CKMBINDEX", "TROPONINI" in the last 168 hours. BNP (last 3 results) No results for input(s): "PROBNP" in the last 8760 hours. HbA1C: No results for input(s): "HGBA1C" in the last 72 hours. CBG: No results for input(s): "GLUCAP" in the last 168 hours. Lipid Profile: No results for input(s): "CHOL", "HDL", "LDLCALC", "TRIG", "CHOLHDL", "LDLDIRECT" in the last 72 hours. Thyroid Function Tests: No  results for input(s): "TSH", "T4TOTAL", "FREET4", "T3FREE", "THYROIDAB" in the last 72 hours. Anemia Panel: No results for input(s): "VITAMINB12", "FOLATE", "FERRITIN", "TIBC", "IRON", "RETICCTPCT" in the last 72 hours. Urinalysis    Component Value Date/Time   COLORURINE AMBER (A) 05/25/2023 1620   APPEARANCEUR TURBID (A) 05/25/2023 1620   APPEARANCEUR Cloudy (A) 10/21/2018 0841   LABSPEC 1.018 05/25/2023 1620   PHURINE 5.0 05/25/2023 1620   GLUCOSEU NEGATIVE 05/25/2023 1620   HGBUR MODERATE (A) 05/25/2023 1620   BILIRUBINUR NEGATIVE 05/25/2023 1620   BILIRUBINUR negative 07/15/2020 0827   BILIRUBINUR Negative 10/21/2018 0841   KETONESUR NEGATIVE 05/25/2023 1620   PROTEINUR 100 (A) 05/25/2023 1620   UROBILINOGEN 0.2 07/15/2020 0827   NITRITE POSITIVE (A) 05/25/2023 1620   LEUKOCYTESUR LARGE (A) 05/25/2023 1620    Unresulted Labs (From admission, onward)     Start     Ordered   05/26/23 0500  Comprehensive metabolic panel  Tomorrow morning,   R        05/26/23 0045   05/26/23 0500  CBC  Tomorrow morning,   R        05/26/23 0045   05/26/23 0117  Magnesium  Add-on,   AD        05/26/23 0116   05/25/23 2040  Gastrointestinal Panel by PCR , Stool  Once,   R        05/25/23 2040   05/25/23 1652  Blood culture (routine x 2)  BLOOD CULTURE X 2,   STAT      05/25/23 1651   05/25/23 1652  Lactic acid, plasma  (Lactic Acid)  Now then every 2 hours,   STAT      05/25/23 1651   05/25/23 1639  Urine Culture  Add-on,   AD       Question:  Indication  Answer:  Dysuria   05/25/23 1640  Medications  famotidine (PEPCID) tablet 40 mg (40 mg Oral Given 05/25/23 2028)  vancomycin (VANCOCIN) capsule 500 mg (500 mg Oral Given 05/25/23 2209)  metroNIDAZOLE (FLAGYL) IVPB 500 mg (has no administration in time range)  oxyCODONE (Oxy IR/ROXICODONE) immediate release tablet 5 mg (5 mg Oral Given 05/25/23 2209)  gabapentin (NEURONTIN) capsule 100 mg (100 mg Oral Given 05/25/23 2209)   meropenem (MERREM) 1 g in sodium chloride 0.9 % 100 mL IVPB (has no administration in time range)  sodium chloride flush (NS) 0.9 % injection 3 mL (3 mLs Intravenous Given 05/26/23 0118)  acetaminophen (TYLENOL) tablet 650 mg (has no administration in time range)    Or  acetaminophen (TYLENOL) suppository 650 mg (has no administration in time range)  lactated ringers infusion ( Intravenous New Bag/Given 05/26/23 0201)  HYDROcodone-acetaminophen (NORCO/VICODIN) 5-325 MG per tablet 1 tablet (has no administration in time range)  midodrine (PROAMATINE) tablet 10 mg (10 mg Oral Given 05/26/23 0145)  lactated ringers bolus 1,000 mL (0 mLs Intravenous Stopped 05/25/23 1654)  ondansetron (ZOFRAN) injection 4 mg (4 mg Intravenous Given 05/25/23 1436)  morphine (PF) 4 MG/ML injection 4 mg (4 mg Intravenous Given 05/25/23 1444)  cefTRIAXone (ROCEPHIN) 1 g in sodium chloride 0.9 % 100 mL IVPB (0 g Intravenous Stopped 05/25/23 1739)  sodium chloride 0.9 % bolus 1,000 mL (0 mLs Intravenous Stopped 05/25/23 2112)  iohexol (OMNIPAQUE) 300 MG/ML solution 75 mL (75 mLs Intravenous Contrast Given 05/25/23 1722)  ondansetron (ZOFRAN) injection 4 mg (4 mg Intravenous Given 05/25/23 1747)  lactated ringers bolus 1,000 mL (0 mLs Intravenous Stopped 05/25/23 2207)  meropenem (MERREM) 1 g in sodium chloride 0.9 % 100 mL IVPB (0 g Intravenous Stopped 05/25/23 2112)  sodium chloride 0.9 % bolus 1,000 mL (0 mLs Intravenous Stopped 05/26/23 0142)  lactated ringers bolus 500 mL (0 mLs Intravenous Stopped 05/26/23 0201)    Radiological Exams on Admission: CT ABDOMEN PELVIS W CONTRAST  Result Date: 05/25/2023 CLINICAL DATA:  Pt arrives via [facility] from home for diarrhea that started yesterday. Pt states the diarrhea and abd pain are consistent with her previous diagnosis for C. Diff. Pt A+Ox4. Pt reports that she did not complete vancomycin from previous C. Diff infection. EXAM: CT ABDOMEN AND PELVIS WITH CONTRAST TECHNIQUE:  Multidetector CT imaging of the abdomen and pelvis was performed using the standard protocol following bolus administration of intravenous contrast. RADIATION DOSE REDUCTION: This exam was performed according to the departmental dose-optimization program which includes automated exposure control, adjustment of the mA and/or kV according to patient size and/or use of iterative reconstruction technique. CONTRAST:  75mL OMNIPAQUE IOHEXOL 300 MG/ML  SOLN COMPARISON:  02/27/2023 FINDINGS: Lower chest: No acute abnormality. Hepatobiliary: Normal liver. Gallbladder distended with some increased attenuation material dependently suggesting small stones or sludge. No wall thickening or pericholecystic fluid. No duct dilation. Pancreas: Partial fatty replacement. No pancreatic mass or inflammation. Spleen: Top-normal in size.  No mass. Adrenals/Urinary Tract: No adrenal masses. Chronic bilateral hydronephrosis, greater on the left. Marked left renal atrophy. Moderate right renal cortical thinning. Right intrarenal stones. Mild dilation of the proximal to mid left ureter. Increased enhancement of the left intrarenal and ureteral urothelium. Right ureter normal in course and in caliber. Bladder minimally distended, grossly unremarkable but well visualized due to artifact from bilateral hip prostheses. Kidney findings are stable from the prior CT. Stomach/Bowel: 6 new stomach mostly decompressed, otherwise unremarkable. Small bowel is normal in caliber. No wall thickening. No inflammation. Possible wall thickening of the  lower sigmoid colon and rectum, which are nondistended. Portions of the sigmoid colon are not well visualized due to artifact from prostheses. Remainder of the colon is normal in caliber. Mild wall thickening. Vascular/Lymphatic: Minor aortic atherosclerosis. No aneurysm. No enlarged lymph nodes. Reproductive: Status post hysterectomy. No pelvic mass. Pelvic sidewall vascular clips. Vascular clips extend into the  lower retroperitoneum. Other: No ascites. Musculoskeletal: Chronic vertebral fractures, T12, L1 and L5, unchanged. No acute fracture. No bone lesion. Bilateral total hip arthroplasties. Lucency adjacent to superior acetabular component the right, unchanged from the prior CT. There is fluid attenuation the soft tissue extending lateral from the right hip that has decreased in size compared to the prior exam. IMPRESSION: 1. Mild colonic wall thickening consistent with the given history of C. difficile infection. No evidence of an abscess. No bowel obstruction. 2. No other acute abnormality within the abdomen or pelvis. 3. Multiple chronic abnormalities detailed above similar to the prior CT. Electronically Signed   By: Amie Portland M.D.   On: 05/25/2023 17:44     Data Reviewed: Relevant notes from primary care and specialist visits, past discharge summaries as available in EHR, including Care Everywhere. Prior diagnostic testing as pertinent to current admission diagnoses Updated medications and problem lists for reconciliation ED course, including vitals, labs, imaging, treatment and response to treatment Triage notes, nursing and pharmacy notes and ED provider's notes Notable results as noted in HPI  Assessment and Plan: * Clostridium difficile diarrhea Pt is c.diff positive this is first recurrence and pt meet fulminant criteria. Pt was started on vancomycin po and flagyl . Contact isolation.    Hypotension Pt's bp has been running low's she has been in high 98's. Pt states her BP does usually stay at 120's.  After initial MIVF pt still low and bolus LR 500 ordered in addition to LR as maintenance at 125 cc/hour.  So far pt has had  Intake/Output Summary (Last 24 hours) at 05/26/2023 0120 Last data filed at 05/26/2023 0118 Gross per 24 hour  Intake 3 ml  Output --  Net 3 ml  Requested nurse to clarify the total IVF so far.  From what is see she has had total of 4.5 L. Will reach out to  ICU APP for pressor support.     CKD stage 3b, GFR 30-44 ml/min [facility]) Lab Results  Component Value Date   CREATININE 1.03 (H) 05/25/2023   CREATININE 1.23 (H) 03/02/2023   CREATININE 1.15 (H) 02/27/2023  Monitor , avoid contrast.    Severe sepsis with septic shock (HCC) Vitals:   05/25/23 1208 05/25/23 1530 05/25/23 1635 05/25/23 1636  BP: 112/67 95/64 (!) 86/57 (!) 92/54   05/25/23 1739 05/25/23 1754 05/25/23 1959 05/25/23 2115  BP: (!) 91/50 (!) 93/55 (!) 92/52 (!) 105/44   05/25/23 2128 05/25/23 2230 05/25/23 2316  BP: (!) 94/49 (!) 97/48 95/60  Pt's blood pressure has remained low.  We will give additional bolus  and LR at MIVF.  ICU APP to evaluate and consult as needed ,Appreciate ICU help.  Pt will need ICU transfer. As she has continued to be hypotensive with aggressive IVF rehydration.   UTI (urinary tract infection) Patient currently continued on meropenem as she has a history of ESBL UTI. Will provide patient with care regarding indwelling Foley.     Prognosis: Guarded   DVT prophylaxis:  SCD's   Consults:  None   Advance Care Planning:    Code Status: Full Code   Family  Communication:  None   Disposition Plan:  TBD   Severity of Illness: The appropriate patient status for this patient is INPATIENT. Inpatient status is judged to be reasonable and necessary in order to provide the required intensity of service to ensure the patient's safety. The patient's presenting symptoms, physical exam findings, and initial radiographic and laboratory data in the context of their chronic comorbidities is felt to place them at high risk for further clinical deterioration. Furthermore, it is not anticipated that the patient will be medically stable for discharge from the hospital within 2 midnights of admission.   * I certify that at the point of admission it is my clinical judgment that the patient will require inpatient hospital care spanning beyond 2 midnights from  the point of admission due to high intensity of service, high risk for further deterioration and high frequency of surveillance required.*  Author: Gertha Calkin, MD 05/26/2023 3:04 AM  For on call review [email or web address].

## 2023-05-25 NOTE — Hospital Course (Signed)
Uti/ C.diff history.  We will do merem for uti and colitis nad metonidazole for colitis as well.  Sepsis. Lactic pending.

## 2023-05-25 NOTE — Assessment & Plan Note (Signed)
Patient currently continued on meropenem as she has a history of ESBL UTI. Will provide patient with care regarding indwelling Foley.

## 2023-05-25 NOTE — ED Triage Notes (Signed)
Pt arrives via PTAR from home for diarrhea that started yesterday. Pt states the diarrhea and abd pain are consistent with her previous diagnosis for C. Diff. Pt A+Ox4. Pt reports that she did not complete vancomycin from previous C. Diff infection.

## 2023-05-26 DIAGNOSIS — A0472 Enterocolitis due to Clostridium difficile, not specified as recurrent: Secondary | ICD-10-CM

## 2023-05-26 DIAGNOSIS — R6521 Severe sepsis with septic shock: Secondary | ICD-10-CM | POA: Diagnosis not present

## 2023-05-26 DIAGNOSIS — Z8249 Family history of ischemic heart disease and other diseases of the circulatory system: Secondary | ICD-10-CM | POA: Diagnosis not present

## 2023-05-26 DIAGNOSIS — Z96651 Presence of right artificial knee joint: Secondary | ICD-10-CM | POA: Diagnosis not present

## 2023-05-26 DIAGNOSIS — Z79899 Other long term (current) drug therapy: Secondary | ICD-10-CM | POA: Diagnosis not present

## 2023-05-26 DIAGNOSIS — B9561 Methicillin susceptible Staphylococcus aureus infection as the cause of diseases classified elsewhere: Secondary | ICD-10-CM | POA: Diagnosis not present

## 2023-05-26 DIAGNOSIS — Z881 Allergy status to other antibiotic agents status: Secondary | ICD-10-CM | POA: Diagnosis not present

## 2023-05-26 DIAGNOSIS — L89891 Pressure ulcer of other site, stage 1: Secondary | ICD-10-CM | POA: Diagnosis not present

## 2023-05-26 DIAGNOSIS — Z885 Allergy status to narcotic agent status: Secondary | ICD-10-CM | POA: Diagnosis not present

## 2023-05-26 DIAGNOSIS — R197 Diarrhea, unspecified: Secondary | ICD-10-CM | POA: Diagnosis not present

## 2023-05-26 DIAGNOSIS — R531 Weakness: Secondary | ICD-10-CM | POA: Diagnosis not present

## 2023-05-26 DIAGNOSIS — B961 Klebsiella pneumoniae [K. pneumoniae] as the cause of diseases classified elsewhere: Secondary | ICD-10-CM | POA: Diagnosis not present

## 2023-05-26 DIAGNOSIS — Z96643 Presence of artificial hip joint, bilateral: Secondary | ICD-10-CM | POA: Diagnosis not present

## 2023-05-26 DIAGNOSIS — Z87891 Personal history of nicotine dependence: Secondary | ICD-10-CM | POA: Diagnosis not present

## 2023-05-26 DIAGNOSIS — Z8616 Personal history of COVID-19: Secondary | ICD-10-CM | POA: Diagnosis not present

## 2023-05-26 DIAGNOSIS — Z7401 Bed confinement status: Secondary | ICD-10-CM | POA: Diagnosis not present

## 2023-05-26 DIAGNOSIS — I129 Hypertensive chronic kidney disease with stage 1 through stage 4 chronic kidney disease, or unspecified chronic kidney disease: Secondary | ICD-10-CM | POA: Diagnosis not present

## 2023-05-26 DIAGNOSIS — A0471 Enterocolitis due to Clostridium difficile, recurrent: Secondary | ICD-10-CM | POA: Diagnosis not present

## 2023-05-26 DIAGNOSIS — Z880 Allergy status to penicillin: Secondary | ICD-10-CM | POA: Diagnosis not present

## 2023-05-26 DIAGNOSIS — N136 Pyonephrosis: Secondary | ICD-10-CM | POA: Diagnosis not present

## 2023-05-26 DIAGNOSIS — Z8541 Personal history of malignant neoplasm of cervix uteri: Secondary | ICD-10-CM | POA: Diagnosis not present

## 2023-05-26 DIAGNOSIS — E872 Acidosis, unspecified: Secondary | ICD-10-CM | POA: Diagnosis not present

## 2023-05-26 DIAGNOSIS — E876 Hypokalemia: Secondary | ICD-10-CM | POA: Diagnosis not present

## 2023-05-26 DIAGNOSIS — M0800 Unspecified juvenile rheumatoid arthritis of unspecified site: Secondary | ICD-10-CM | POA: Diagnosis not present

## 2023-05-26 DIAGNOSIS — A414 Sepsis due to anaerobes: Secondary | ICD-10-CM | POA: Diagnosis not present

## 2023-05-26 DIAGNOSIS — N1832 Chronic kidney disease, stage 3b: Secondary | ICD-10-CM | POA: Diagnosis not present

## 2023-05-26 LAB — COMPREHENSIVE METABOLIC PANEL
ALT: 12 U/L (ref 0–44)
AST: 15 U/L (ref 15–41)
Albumin: 2.3 g/dL — ABNORMAL LOW (ref 3.5–5.0)
Alkaline Phosphatase: 84 U/L (ref 38–126)
Anion gap: 10 (ref 5–15)
BUN: 16 mg/dL (ref 8–23)
CO2: 18 mmol/L — ABNORMAL LOW (ref 22–32)
Calcium: 7.6 mg/dL — ABNORMAL LOW (ref 8.9–10.3)
Chloride: 110 mmol/L (ref 98–111)
Creatinine, Ser: 1.06 mg/dL — ABNORMAL HIGH (ref 0.44–1.00)
GFR, Estimated: 57 mL/min — ABNORMAL LOW (ref >60)
Glucose, Bld: 104 mg/dL — ABNORMAL HIGH (ref 70–99)
Potassium: 3.7 mmol/L (ref 3.5–5.1)
Sodium: 138 mmol/L (ref 135–145)
Total Bilirubin: 1 mg/dL (ref 0.3–1.2)
Total Protein: 4.9 g/dL — ABNORMAL LOW (ref 6.5–8.1)

## 2023-05-26 LAB — CBC
HCT: 32.7 % — ABNORMAL LOW (ref 36.0–46.0)
Hemoglobin: 10.1 g/dL — ABNORMAL LOW (ref 12.0–15.0)
MCH: 27.9 pg (ref 26.0–34.0)
MCHC: 30.9 g/dL (ref 30.0–36.0)
MCV: 90.3 fL (ref 80.0–100.0)
Platelets: 243 10*3/uL (ref 150–400)
RBC: 3.62 MIL/uL — ABNORMAL LOW (ref 3.87–5.11)
RDW: 14.2 % (ref 11.5–15.5)
WBC: 14 10*3/uL — ABNORMAL HIGH (ref 4.0–10.5)
nRBC: 0 % (ref 0.0–0.2)

## 2023-05-26 LAB — PHOSPHORUS: Phosphorus: 2.6 mg/dL (ref 2.5–4.6)

## 2023-05-26 LAB — MAGNESIUM: Magnesium: 1.5 mg/dL — ABNORMAL LOW (ref 1.7–2.4)

## 2023-05-26 LAB — LACTIC ACID, PLASMA: Lactic Acid, Venous: 1 mmol/L (ref 0.5–1.9)

## 2023-05-26 MED ORDER — SODIUM CHLORIDE 0.9 % IV SOLN: INTRAVENOUS | Status: DC

## 2023-05-26 MED ORDER — ALBUMIN HUMAN 25 % IV SOLN
12.5000 g | Freq: Once | INTRAVENOUS | Status: AC
  Administered 2023-05-26: 12.5 g via INTRAVENOUS
  Filled 2023-05-26: qty 50

## 2023-05-26 MED ORDER — ACETAMINOPHEN 650 MG RE SUPP: 650.0000 mg | Freq: Four times a day (QID) | RECTAL | Status: DC | PRN

## 2023-05-26 MED ORDER — HYDROCODONE-ACETAMINOPHEN 5-325 MG PO TABS: 1.0000 | ORAL_TABLET | ORAL | Status: DC | PRN

## 2023-05-26 MED ORDER — HYDROCODONE-ACETAMINOPHEN 5-325 MG PO TABS
1.0000 | ORAL_TABLET | Freq: Four times a day (QID) | ORAL | Status: DC | PRN
  Administered 2023-05-26: 1 via ORAL
  Filled 2023-05-26: qty 1

## 2023-05-26 MED ORDER — SODIUM CHLORIDE 0.9% FLUSH
3.0000 mL | Freq: Two times a day (BID) | INTRAVENOUS | Status: DC
  Administered 2023-05-26 – 2023-06-02 (×15): 3 mL via INTRAVENOUS

## 2023-05-26 MED ORDER — MIDODRINE HCL 5 MG PO TABS
10.0000 mg | ORAL_TABLET | Freq: Three times a day (TID) | ORAL | Status: DC
  Administered 2023-05-26 – 2023-05-29 (×10): 10 mg via ORAL
  Filled 2023-05-26 (×11): qty 2

## 2023-05-26 MED ORDER — ACETAMINOPHEN 325 MG PO TABS: 650.0000 mg | ORAL_TABLET | Freq: Four times a day (QID) | ORAL | Status: DC | PRN

## 2023-05-26 MED ORDER — ONDANSETRON HCL 4 MG/2ML IJ SOLN
4.0000 mg | Freq: Four times a day (QID) | INTRAMUSCULAR | Status: DC | PRN
  Administered 2023-05-26 – 2023-05-30 (×4): 4 mg via INTRAVENOUS
  Filled 2023-05-26 (×5): qty 2

## 2023-05-26 MED ORDER — SODIUM CHLORIDE 0.9 % IV BOLUS
1000.0000 mL | Freq: Once | INTRAVENOUS | Status: AC
  Administered 2023-05-26: 1000 mL via INTRAVENOUS

## 2023-05-26 MED ORDER — LACTATED RINGERS IV BOLUS
500.0000 mL | Freq: Once | INTRAVENOUS | Status: AC
  Administered 2023-05-26: 500 mL via INTRAVENOUS

## 2023-05-26 MED ORDER — MAGNESIUM SULFATE 2 GM/50ML IV SOLN
2.0000 g | Freq: Once | INTRAVENOUS | Status: AC
  Administered 2023-05-26: 2 g via INTRAVENOUS
  Filled 2023-05-26: qty 50

## 2023-05-26 MED ORDER — LACTATED RINGERS IV SOLN: INTRAVENOUS | Status: AC

## 2023-05-26 MED ORDER — FIDAXOMICIN 200 MG PO TABS
200.0000 mg | ORAL_TABLET | Freq: Two times a day (BID) | ORAL | Status: DC
  Administered 2023-05-26 – 2023-06-01 (×14): 200 mg via ORAL
  Filled 2023-05-26 (×15): qty 1

## 2023-05-26 NOTE — Assessment & Plan Note (Signed)
Pt is c.diff positive this is first recurrence and pt meet fulminant criteria. Pt was started on vancomycin po and flagyl . Contact isolation.

## 2023-05-26 NOTE — Assessment & Plan Note (Signed)
Vitals:   05/25/23 1208 05/25/23 1530 05/25/23 1635 05/25/23 1636  BP: 112/67 95/64 (!) 86/57 (!) 92/54   05/25/23 1739 05/25/23 1754 05/25/23 1959 05/25/23 2115  BP: (!) 91/50 (!) 93/55 (!) 92/52 (!) 105/44   05/25/23 2128 05/25/23 2230 05/25/23 2316  BP: (!) 94/49 (!) 97/48 95/60  Pt's blood pressure has remained low.  We will give additional bolus  and LR at MIVF.  ICU APP to evaluate and consult as needed ,Appreciate ICU help.  Pt will need ICU transfer. As she has continued to be hypotensive with aggressive IVF rehydration.

## 2023-05-26 NOTE — Assessment & Plan Note (Addendum)
Pt's bp has been running low's she has been in high 98's. Pt states her BP does usually stay at 120's.  After initial MIVF pt still low and bolus LR 500 ordered in addition to LR as maintenance at 125 cc/hour.  So far pt has had  Intake/Output Summary (Last 24 hours) at 05/26/2023 0120 Last data filed at 05/26/2023 0118 Gross per 24 hour  Intake 3 ml  Output --  Net 3 ml  Requested nurse to clarify the total IVF so far.  From what is see she has had total of 4.5 L. Will reach out to ICU APP for pressor support.

## 2023-05-26 NOTE — ED Notes (Signed)
Provider noted the heart rate and stated to continue to monitor. Nelly Laurence, RN notified.

## 2023-05-26 NOTE — ED Notes (Signed)
This RN called lab to request a.m. lab draw.  Per lab they will put her on the list.

## 2023-05-26 NOTE — Progress Notes (Addendum)
Progress Note    Jeanne [name]  NWG:[id number] DOB: 1950/09/29  DOA: 05/25/2023 PCP: Jerl Mina, MD      Brief Narrative:    Medical records reviewed and are as summarized below:  Jeanne [name] is a 72 y.o. female with medical history significant for cervical cancer, rheumatoid arthritis, hypertension, nephrolithiasis, left UPJ urolithiasis, CKD stage IIIb, COVID-19 infection, C. difficile infection requiring hospitalization from 11/09/2022 to 11/12/2022, recurrent C. difficile infection requiring hospitalization from 12/27/2022 through 01/19/2023, Campylobacter colitis with negative C. difficile hospitalized from 02/10/2023 through 02/17/2023, hospitalized from 02/27/2023 through 03/02/2023 for diarrhea with negative C. difficile test.  She presented to the hospital because of diarrhea that started the day prior to admission.  Also complained of pain in urination and increased frequency of micturition.  She was found to have recurrent C. difficile diarrhea and acute UTI.       Assessment/Plan:   Principal Problem:   Clostridium difficile diarrhea Active Problems:   Hypotension   CKD stage 3b, GFR 30-44 ml/min (HCC)   UTI (urinary tract infection)   Severe sepsis with septic shock (HCC)    Body mass index is 24.61 kg/m.   Sepsis secondary to recurrent C. difficile colitis: She had previously been treated with oral vancomycin in the past.  She had not been discharged on fidaxomicin because of cost.  Start fidaxomicin.  Discontinue IV Flagyl and oral vancomycin. Previous positive C. difficile test on 11/09/2022, 12/29/2022 and 05/25/2023. Outpatient follow-up with gastroenterology as recommended.   Acute UTI with dysuria and increased frequency of micturition: Continue IV meropenem for now.  Follow-up urine cultures.   Hypotension: BP is better.  Discontinue IV fluids and monitor levels.   Hypomagnesemia: Magnesium is 1.5.  Replete magnesium with IV magnesium sulfate  and monitor levels. Check phosphorus level.   Other comorbidities include CKD stage IIIb, chronic bilateral hydronephrosis, left UPJ urolithiasis, recurrent UTIs, history of cervical cancer, rheumatoid arthritis   Diet Order             Diet full liquid Room service appropriate? Yes; Fluid consistency: Thin  Diet effective now                            Consultants: None  Procedures: None    Medications:    famotidine  40 mg Oral BID   fidaxomicin  200 mg Oral BID   gabapentin  100 mg Oral QHS   midodrine  10 mg Oral TID WC   sodium chloride flush  3 mL Intravenous Q12H   Continuous Infusions:  lactated ringers 125 mL/hr at 05/26/23 0201   meropenem (MERREM) IV 1 g (05/26/23 0858)     Anti-infectives (From admission, onward)    Start     Dose/Rate Route Frequency Ordered Stop   05/26/23 1000  fidaxomicin (DIFICID) tablet 200 mg        200 mg Oral 2 times daily 05/26/23 0845     05/26/23 0900  meropenem (MERREM) 1 g in sodium chloride 0.9 % 100 mL IVPB        1 g 200 mL/hr over 30 Minutes Intravenous Every 12 hours 05/25/23 2150     05/26/23 0500  metroNIDAZOLE (FLAGYL) IVPB 500 mg  Status:  Discontinued        500 mg 100 mL/hr over 60 Minutes Intravenous Every 8 hours 05/25/23 2142 05/26/23 0851   05/25/23 2200  vancomycin (VANCOCIN) capsule 500 mg  Status:  Discontinued        500 mg Oral Every 6 hours 05/25/23 2142 05/26/23 0845   05/25/23 2015  meropenem (MERREM) 1 g in sodium chloride 0.9 % 100 mL IVPB        1 g 200 mL/hr over 30 Minutes Intravenous  Once 05/25/23 2006 05/25/23 2112   05/25/23 2000  vancomycin (VANCOCIN) capsule 500 mg  Status:  Discontinued        500 mg Oral Every 6 hours 05/25/23 1945 05/25/23 2016   05/25/23 2000  metroNIDAZOLE (FLAGYL) IVPB 500 mg  Status:  Discontinued        500 mg 100 mL/hr over 60 Minutes Intravenous Every 8 hours 05/25/23 1945 05/25/23 2148   05/25/23 1645  cefTRIAXone (ROCEPHIN) 1 g in sodium  chloride 0.9 % 100 mL IVPB        1 g 200 mL/hr over 30 Minutes Intravenous  Once 05/25/23 1640 05/25/23 1739              Family Communication/Anticipated D/C date and plan/Code Status   DVT prophylaxis:      Code Status: Full Code  Family Communication: None Disposition Plan: Plan to discharge home   Status is: Inpatient Remains inpatient appropriate because: C. difficile diarrhea       Subjective:   Interval events noted.  Diarrhea is better today.  She said she had a bowel 10 watery stools yesterday.  She had dysuria which seems to be improving. Abygail, RN, was at the bedside.   Objective:    Vitals:   05/26/23 0430 05/26/23 0632 05/26/23 0730 05/26/23 0830  BP: 108/67 (!) 99/56 (!) 111/58 (!) 108/51  Pulse: 87 78 77 86  Resp:  18    Temp:  98.3 F (36.8 C)    TempSrc:  Oral    SpO2: 100% 99% 100% 99%  Weight:       No data found.   Intake/Output Summary (Last 24 hours) at 05/26/2023 0917 Last data filed at 05/26/2023 0118 Gross per 24 hour  Intake 3 ml  Output --  Net 3 ml   Filed Weights   05/25/23 2018  Weight: 65 kg    Exam:   GEN: NAD SKIN: Warm and dry EYES: No pallor or icterus ENT: MMM CV: RRR PULM: CTA B ABD: soft, ND, midabdominal tenderness, +BS CNS: AAO x 3, non focal EXT: B/l leg edema (1+), no tenderness       Data Reviewed:   I have personally reviewed following labs and imaging studies:  Labs: Labs show the following:   Basic Metabolic Panel: Recent Labs  Lab 05/25/23 1610  NA 135  K 3.6  CL 106  CO2 18*  GLUCOSE 100*  BUN 17  CREATININE 1.03*  CALCIUM 7.6*   GFR Estimated Creatinine Clearance: 43.9 mL/min (A) (by C-G formula based on SCr of 1.03 mg/dL (H)). Liver Function Tests: Recent Labs  Lab 05/25/23 1610  AST 15  ALT 11  ALKPHOS 91  BILITOT 0.7  PROT 4.9*  ALBUMIN 2.0*   Recent Labs  Lab 05/25/23 1610  LIPASE 15   No results for input(s): "AMMONIA" in the last 168  hours. Coagulation profile No results for input(s): "INR", "PROTIME" in the last 168 hours.  CBC: Recent Labs  Lab 05/25/23 1211  WBC 18.7*  HGB 12.6  HCT 39.5  MCV 88.4  PLT 306   Cardiac Enzymes: No results for input(s): "CKTOTAL", "CKMB", "CKMBINDEX", "TROPONINI" in the last 168  hours. BNP (last 3 results) No results for input(s): "PROBNP" in the last 8760 hours. CBG: No results for input(s): "GLUCAP" in the last 168 hours. D-Dimer: No results for input(s): "DDIMER" in the last 72 hours. Hgb A1c: No results for input(s): "HGBA1C" in the last 72 hours. Lipid Profile: No results for input(s): "CHOL", "HDL", "LDLCALC", "TRIG", "CHOLHDL", "LDLDIRECT" in the last 72 hours. Thyroid function studies: No results for input(s): "TSH", "T4TOTAL", "T3FREE", "THYROIDAB" in the last 72 hours.  Invalid input(s): "FREET3" Anemia work up: No results for input(s): "VITAMINB12", "FOLATE", "FERRITIN", "TIBC", "IRON", "RETICCTPCT" in the last 72 hours. Sepsis Labs: Recent Labs  Lab 05/25/23 1211 05/25/23 2022  WBC 18.7*  --   LATICACIDVEN  --  1.3    Microbiology Recent Results (from the past 240 hour(s))  C Difficile Quick Screen w PCR reflex     Status: Abnormal   Collection Time: 05/25/23  8:22 PM   Specimen: STOOL  Result Value Ref Range Status   C Diff antigen POSITIVE (A) NEGATIVE Final   C Diff toxin POSITIVE (A) NEGATIVE Final   C Diff interpretation Toxin producing C. difficile detected.  Final    Comment: CRITICAL RESULT CALLED TO, READ BACK BY AND VERIFIED WITH: Glo Herring RN ED @ 2106 05/25/23 lfd Performed at [facility], [address] [place], [place] [address]   Blood culture (routine x 2)     Status: None (Preliminary result)   Collection Time: 05/25/23  8:35 PM   Specimen: BLOOD  Result Value Ref Range Status   Specimen Description BLOOD BLOOD RIGHT FOREARM  Final   Special Requests   Final    BOTTLES DRAWN AEROBIC AND ANAEROBIC Blood Culture  adequate volume   Culture   Final    NO GROWTH < 12 HOURS Performed at [facility], [address] [place], [place] [address]    Report Status PENDING  Incomplete  Blood culture (routine x 2)     Status: None (Preliminary result)   Collection Time: 05/25/23  8:35 PM   Specimen: BLOOD  Result Value Ref Range Status   Specimen Description BLOOD BLOOD RIGHT ARM  Final   Special Requests   Final    BOTTLES DRAWN AEROBIC AND ANAEROBIC Blood Culture adequate volume   Culture   Final    NO GROWTH < 12 HOURS Performed at [facility], [address] [place], [place] [address]    Report Status PENDING  Incomplete    Procedures and diagnostic studies:  CT ABDOMEN PELVIS W CONTRAST  Result Date: 05/25/2023 CLINICAL DATA:  Pt arrives via [facility] from home for diarrhea that started yesterday. Pt states the diarrhea and abd pain are consistent with her previous diagnosis for C. Diff. Pt A+Ox4. Pt reports that she did not complete vancomycin from previous C. Diff infection. EXAM: CT ABDOMEN AND PELVIS WITH CONTRAST TECHNIQUE: Multidetector CT imaging of the abdomen and pelvis was performed using the standard protocol following bolus administration of intravenous contrast. RADIATION DOSE REDUCTION: This exam was performed according to the departmental dose-optimization program which includes automated exposure control, adjustment of the mA and/or kV according to patient size and/or use of iterative reconstruction technique. CONTRAST:  75mL OMNIPAQUE IOHEXOL 300 MG/ML  SOLN COMPARISON:  02/27/2023 FINDINGS: Lower chest: No acute abnormality. Hepatobiliary: Normal liver. Gallbladder distended with some increased attenuation material dependently suggesting small stones or sludge. No wall thickening or pericholecystic fluid. No duct dilation. Pancreas: Partial fatty replacement. No pancreatic mass or  inflammation. Spleen: Top-normal in size.  No mass. Adrenals/Urinary Tract: No adrenal  masses. Chronic bilateral hydronephrosis, greater on the left. Marked left renal atrophy. Moderate right renal cortical thinning. Right intrarenal stones. Mild dilation of the proximal to mid left ureter. Increased enhancement of the left intrarenal and ureteral urothelium. Right ureter normal in course and in caliber. Bladder minimally distended, grossly unremarkable but well visualized due to artifact from bilateral hip prostheses. Kidney findings are stable from the prior CT. Stomach/Bowel: 6 new stomach mostly decompressed, otherwise unremarkable. Small bowel is normal in caliber. No wall thickening. No inflammation. Possible wall thickening of the lower sigmoid colon and rectum, which are nondistended. Portions of the sigmoid colon are not well visualized due to artifact from prostheses. Remainder of the colon is normal in caliber. Mild wall thickening. Vascular/Lymphatic: Minor aortic atherosclerosis. No aneurysm. No enlarged lymph nodes. Reproductive: Status post hysterectomy. No pelvic mass. Pelvic sidewall vascular clips. Vascular clips extend into the lower retroperitoneum. Other: No ascites. Musculoskeletal: Chronic vertebral fractures, T12, L1 and L5, unchanged. No acute fracture. No bone lesion. Bilateral total hip arthroplasties. Lucency adjacent to superior acetabular component the right, unchanged from the prior CT. There is fluid attenuation the soft tissue extending lateral from the right hip that has decreased in size compared to the prior exam. IMPRESSION: 1. Mild colonic wall thickening consistent with the given history of C. difficile infection. No evidence of an abscess. No bowel obstruction. 2. No other acute abnormality within the abdomen or pelvis. 3. Multiple chronic abnormalities detailed above similar to the prior CT. Electronically Signed   By: Amie Portland M.D.   On: 05/25/2023 17:44               LOS: 0 days   Karmah Potocki  [facility]   Pager on  [email or web address]. If 7PM-7AM, please contact night-coverage at [email or web address]     05/26/2023, 9:17 AM

## 2023-05-26 NOTE — ED Notes (Addendum)
Patient called out, complaining of PIV site suddenly burning.  Infusion stopped, site assesed; PIV patent, able to get blood return as well.  No signs of infiltration at this time.  Magnesium restarted at 15mL/hour; see MAR.  Patient declines the burning sensation at the slower rate.

## 2023-05-26 NOTE — Assessment & Plan Note (Signed)
Lab Results  Component Value Date   CREATININE 1.03 (H) 05/25/2023   CREATININE 1.23 (H) 03/02/2023   CREATININE 1.15 (H) 02/27/2023  Monitor , avoid contrast.

## 2023-05-26 NOTE — ED Notes (Signed)
Per previous shift RN lab called to collect morning labs on pt.

## 2023-05-26 NOTE — ED Notes (Signed)
Pt cleaned up.

## 2023-05-26 NOTE — ED Notes (Signed)
Patient had liquid green stool noted during pericare and change of clean brief.

## 2023-05-26 NOTE — ED Notes (Signed)
RN attempt to apply barrier cream at pt request and pt started having diarrhea, bed pan placed. Pt changed and pillow positioned under knees.

## 2023-05-26 NOTE — ED Notes (Signed)
Pt cleaned up, x1 diarrhea. New brief applied. Pt denies other needs at this time

## 2023-05-26 NOTE — ED Notes (Signed)
Provider notified of HR at 49

## 2023-05-27 ENCOUNTER — Other Ambulatory Visit (HOSPITAL_COMMUNITY): Payer: Self-pay

## 2023-05-27 ENCOUNTER — Encounter: Payer: Self-pay | Admitting: Internal Medicine

## 2023-05-27 ENCOUNTER — Other Ambulatory Visit: Payer: Self-pay

## 2023-05-27 DIAGNOSIS — A0472 Enterocolitis due to Clostridium difficile, not specified as recurrent: Secondary | ICD-10-CM | POA: Diagnosis not present

## 2023-05-27 LAB — BASIC METABOLIC PANEL
Anion gap: 9 (ref 5–15)
BUN: 14 mg/dL (ref 8–23)
CO2: 18 mmol/L — ABNORMAL LOW (ref 22–32)
Calcium: 7.9 mg/dL — ABNORMAL LOW (ref 8.9–10.3)
Chloride: 110 mmol/L (ref 98–111)
Creatinine, Ser: 1.16 mg/dL — ABNORMAL HIGH (ref 0.44–1.00)
GFR, Estimated: 51 mL/min — ABNORMAL LOW (ref >60)
Glucose, Bld: 75 mg/dL (ref 70–99)
Potassium: 3.2 mmol/L — ABNORMAL LOW (ref 3.5–5.1)
Sodium: 137 mmol/L (ref 135–145)

## 2023-05-27 LAB — CBC WITH DIFFERENTIAL/PLATELET
Abs Immature Granulocytes: 0.09 10*3/uL — ABNORMAL HIGH (ref 0.00–0.07)
Basophils Absolute: 0 10*3/uL (ref 0.0–0.1)
Basophils Relative: 0 %
Eosinophils Absolute: 0.2 10*3/uL (ref 0.0–0.5)
Eosinophils Relative: 3 %
HCT: 34.8 % — ABNORMAL LOW (ref 36.0–46.0)
Hemoglobin: 10.6 g/dL — ABNORMAL LOW (ref 12.0–15.0)
Immature Granulocytes: 1 %
Lymphocytes Relative: 5 %
Lymphs Abs: 0.3 10*3/uL — ABNORMAL LOW (ref 0.7–4.0)
MCH: 27.8 pg (ref 26.0–34.0)
MCHC: 30.5 g/dL (ref 30.0–36.0)
MCV: 91.3 fL (ref 80.0–100.0)
Monocytes Absolute: 0.3 10*3/uL (ref 0.1–1.0)
Monocytes Relative: 4 %
Neutro Abs: 6.1 10*3/uL (ref 1.7–7.7)
Neutrophils Relative %: 87 %
Platelets: 214 10*3/uL (ref 150–400)
RBC: 3.81 MIL/uL — ABNORMAL LOW (ref 3.87–5.11)
RDW: 14.4 % (ref 11.5–15.5)
WBC: 7 10*3/uL (ref 4.0–10.5)
nRBC: 0 % (ref 0.0–0.2)

## 2023-05-27 LAB — LACTIC ACID, PLASMA: Lactic Acid, Venous: 1 mmol/L (ref 0.5–1.9)

## 2023-05-27 LAB — MAGNESIUM: Magnesium: 2.4 mg/dL (ref 1.7–2.4)

## 2023-05-27 LAB — PHOSPHORUS: Phosphorus: 2.9 mg/dL (ref 2.5–4.6)

## 2023-05-27 MED ORDER — ENSURE ENLIVE PO LIQD: 237.0000 mL | Freq: Two times a day (BID) | ORAL | Status: DC

## 2023-05-27 MED ORDER — OXYCODONE HCL 5 MG PO TABS
10.0000 mg | ORAL_TABLET | Freq: Four times a day (QID) | ORAL | Status: DC | PRN
  Administered 2023-05-28 – 2023-06-02 (×13): 10 mg via ORAL
  Filled 2023-05-27 (×14): qty 2

## 2023-05-27 MED ORDER — FAMOTIDINE 20 MG PO TABS
20.0000 mg | ORAL_TABLET | Freq: Every day | ORAL | Status: DC
  Administered 2023-05-28 – 2023-06-02 (×6): 20 mg via ORAL
  Filled 2023-05-27 (×6): qty 1

## 2023-05-27 MED ORDER — POTASSIUM CHLORIDE CRYS ER 20 MEQ PO TBCR
40.0000 meq | EXTENDED_RELEASE_TABLET | Freq: Once | ORAL | Status: AC
  Administered 2023-05-27: 40 meq via ORAL
  Filled 2023-05-27: qty 2

## 2023-05-27 NOTE — ED Notes (Signed)
Informed RN bed assigned 

## 2023-05-27 NOTE — Progress Notes (Addendum)
Progress Note    Jeanne [name]  NWG:[id number] DOB: Feb 16, 1951  DOA: 05/25/2023 PCP: Jerl Mina, MD      Brief Narrative:    Medical records reviewed and are as summarized below:  Jeanne [name] is a 72 y.o. female with medical history significant for cervical cancer, rheumatoid arthritis, hypertension, nephrolithiasis, left UPJ urolithiasis, CKD stage IIIb, COVID-19 infection, C. difficile infection requiring hospitalization from 11/09/2022 to 11/12/2022, recurrent C. difficile infection requiring hospitalization from 12/27/2022 through 01/19/2023, Campylobacter colitis with negative C. difficile hospitalized from 02/10/2023 through 02/17/2023, hospitalized from 02/27/2023 through 03/02/2023 for diarrhea with negative C. difficile test.  She presented to the hospital because of diarrhea that started the day prior to admission.  Also complained of pain in urination and increased frequency of micturition.  She was found to have recurrent C. difficile diarrhea and acute UTI.       Assessment/Plan:   Principal Problem:   Clostridium difficile diarrhea Active Problems:   Hypotension   CKD stage 3b, GFR 30-44 ml/min (HCC)   UTI (urinary tract infection)   Severe sepsis with septic shock (HCC)    Body mass index is 24.61 kg/m.   Sepsis secondary to recurrent C. difficile colitis: Diarrhea is improving.  Leukocytosis  has improved.  Continue fidaxomicin.   Previous positive C. difficile test on 11/09/2022, 12/29/2022 and 05/25/2023. Outpatient follow-up with gastroenterology was recommended.   Acute UTI with dysuria and increased frequency of micturition:  Urine culture showed Staphylococcus aureus and Klebsiella oxytoca Continue IV meropenem.  Follow-up sensitivity report.   Hypotension: It appears patient has chronic/intermittent hypotension and she was taking midodrine at home.  Continue midodrine and monitor BP.    Hypomagnesemia: Improved.   Hypokalemia: Replete  potassium and monitor levels   Other comorbidities include CKD stage IIIb, chronic bilateral hydronephrosis, left UPJ urolithiasis, recurrent UTIs, history of cervical cancer, rheumatoid arthritis   Diet Order             Diet regular Fluid consistency: Thin  Diet effective now                            Consultants: None  Procedures: None    Medications:    [START ON 05/28/2023] famotidine  20 mg Oral Daily   feeding supplement  237 mL Oral BID BM   fidaxomicin  200 mg Oral BID   gabapentin  100 mg Oral QHS   midodrine  10 mg Oral TID WC   sodium chloride flush  3 mL Intravenous Q12H   Continuous Infusions:  meropenem (MERREM) IV Stopped (05/27/23 0914)     Anti-infectives (From admission, onward)    Start     Dose/Rate Route Frequency Ordered Stop   05/26/23 1000  fidaxomicin (DIFICID) tablet 200 mg        200 mg Oral 2 times daily 05/26/23 0845     05/26/23 0900  meropenem (MERREM) 1 g in sodium chloride 0.9 % 100 mL IVPB        1 g 200 mL/hr over 30 Minutes Intravenous Every 12 hours 05/25/23 2150     05/26/23 0500  metroNIDAZOLE (FLAGYL) IVPB 500 mg  Status:  Discontinued        500 mg 100 mL/hr over 60 Minutes Intravenous Every 8 hours 05/25/23 2142 05/26/23 0851   05/25/23 2200  vancomycin (VANCOCIN) capsule 500 mg  Status:  Discontinued  500 mg Oral Every 6 hours 05/25/23 2142 05/26/23 0845   05/25/23 2015  meropenem (MERREM) 1 g in sodium chloride 0.9 % 100 mL IVPB        1 g 200 mL/hr over 30 Minutes Intravenous  Once 05/25/23 2006 05/25/23 2112   05/25/23 2000  vancomycin (VANCOCIN) capsule 500 mg  Status:  Discontinued        500 mg Oral Every 6 hours 05/25/23 1945 05/25/23 2016   05/25/23 2000  metroNIDAZOLE (FLAGYL) IVPB 500 mg  Status:  Discontinued        500 mg 100 mL/hr over 60 Minutes Intravenous Every 8 hours 05/25/23 1945 05/25/23 2148   05/25/23 1645  cefTRIAXone (ROCEPHIN) 1 g in sodium chloride 0.9 % 100 mL IVPB         1 g 200 mL/hr over 30 Minutes Intravenous  Once 05/25/23 1640 05/25/23 1739              Family Communication/Anticipated D/C date and plan/Code Status   DVT prophylaxis:      Code Status: Full Code  Family Communication: None Disposition Plan: Plan to discharge home   Status is: Inpatient Remains inpatient appropriate because: C. difficile diarrhea       Subjective:   Interval events noted.  Diarrhea is improving.  She had about 2 watery stools last night.  Dysuria and increased frequency of micturition are improving as well.  She complains of pain in her lower extremities from arthritis.  She said her diclofenac had been discontinued in the past because of her kidney disease.  Heather, RN, was at the bedside.   Objective:    Vitals:   05/27/23 1000 05/27/23 1100 05/27/23 1200 05/27/23 1305  BP: 118/64  (!) 126/58 136/65  Pulse: 74  74 70  Resp: 13  13 15   Temp:    98.3 F (36.8 C)  TempSrc:    Oral  SpO2: 100%  100% 100%  Weight:      Height:  5\' 4"  (1.626 m)     No data found.   Intake/Output Summary (Last 24 hours) at 05/27/2023 1615 Last data filed at 05/27/2023 0914 Gross per 24 hour  Intake 100 ml  Output --  Net 100 ml   Filed Weights   05/25/23 2018  Weight: 65 kg    Exam:   GEN: NAD SKIN: Warm and dry EYES: No pallor or icterus ENT: MMM CV: RRR PULM: CTA B ABD: soft, ND, NT, +BS CNS: AAO x 3, non focal EXT: No edema or tenderness    Pressure Injury 05/26/23 Perineum Stage 1 -  Intact skin with non-blanchable redness of a localized area usually over a bony prominence. (Active)  05/26/23 2030  Location: Perineum  Location Orientation:   Staging: Stage 1 -  Intact skin with non-blanchable redness of a localized area usually over a bony prominence.  Wound Description (Comments):   Present on Admission: Yes     Data Reviewed:   I have personally reviewed following labs and imaging studies:  Labs: Labs show the  following:   Basic Metabolic Panel: Recent Labs  Lab 05/25/23 1610 05/26/23 0932 05/27/23 0636  NA 135 138 137  K 3.6 3.7 3.2*  CL 106 110 110  CO2 18* 18* 18*  GLUCOSE 100* 104* 75  BUN 17 16 14   CREATININE 1.03* 1.06* 1.16*  CALCIUM 7.6* 7.6* 7.9*  MG  --  1.5* 2.4  PHOS  --  2.6 2.9  GFR Estimated Creatinine Clearance: 39 mL/min (A) (by C-G formula based on SCr of 1.16 mg/dL (H)). Liver Function Tests: Recent Labs  Lab 05/25/23 1610 05/26/23 0932  AST 15 15  ALT 11 12  ALKPHOS 91 84  BILITOT 0.7 1.0  PROT 4.9* 4.9*  ALBUMIN 2.0* 2.3*   Recent Labs  Lab 05/25/23 1610  LIPASE 15   No results for input(s): "AMMONIA" in the last 168 hours. Coagulation profile No results for input(s): "INR", "PROTIME" in the last 168 hours.  CBC: Recent Labs  Lab 05/25/23 1211 05/26/23 0932 05/27/23 0636  WBC 18.7* 14.0* 7.0  NEUTROABS  --   --  6.1  HGB 12.6 10.1* 10.6*  HCT 39.5 32.7* 34.8*  MCV 88.4 90.3 91.3  PLT 306 243 214   Cardiac Enzymes: No results for input(s): "CKTOTAL", "CKMB", "CKMBINDEX", "TROPONINI" in the last 168 hours. BNP (last 3 results) No results for input(s): "PROBNP" in the last 8760 hours. CBG: No results for input(s): "GLUCAP" in the last 168 hours. D-Dimer: No results for input(s): "DDIMER" in the last 72 hours. Hgb A1c: No results for input(s): "HGBA1C" in the last 72 hours. Lipid Profile: No results for input(s): "CHOL", "HDL", "LDLCALC", "TRIG", "CHOLHDL", "LDLDIRECT" in the last 72 hours. Thyroid function studies: No results for input(s): "TSH", "T4TOTAL", "T3FREE", "THYROIDAB" in the last 72 hours.  Invalid input(s): "FREET3" Anemia work up: No results for input(s): "VITAMINB12", "FOLATE", "FERRITIN", "TIBC", "IRON", "RETICCTPCT" in the last 72 hours. Sepsis Labs: Recent Labs  Lab 05/25/23 1211 05/25/23 2022 05/26/23 0932 05/27/23 0636  WBC 18.7*  --  14.0* 7.0  LATICACIDVEN  --  1.3 1.0 1.0    Microbiology Recent  Results (from the past 240 hour(s))  Urine Culture     Status: Abnormal (Preliminary result)   Collection Time: 05/25/23  4:20 PM   Specimen: Urine, Clean Catch  Result Value Ref Range Status   Specimen Description   Final    URINE, CLEAN CATCH Performed at [facility], [address], [place], [place] [address]    Special Requests   Final    NONE Performed at [facility], [address], [place], [place] [address]    Culture (A)  Final    >=100,000 COLONIES/mL STAPHYLOCOCCUS AUREUS 30,000 COLONIES/mL KLEBSIELLA OXYTOCA SUSCEPTIBILITIES TO FOLLOW Performed at [facility], [address] [place], [place] [address]    Report Status PENDING  Incomplete  C Difficile Quick Screen w PCR reflex     Status: Abnormal   Collection Time: 05/25/23  8:22 PM   Specimen: STOOL  Result Value Ref Range Status   C Diff antigen POSITIVE (A) NEGATIVE Final   C Diff toxin POSITIVE (A) NEGATIVE Final   C Diff interpretation Toxin producing C. difficile detected.  Final    Comment: CRITICAL RESULT CALLED TO, READ BACK BY AND VERIFIED WITH: Glo Herring RN ED @ 2106 05/25/23 lfd Performed at [facility], [address] [place], [place] [address]   Blood culture (routine x 2)     Status: None (Preliminary result)   Collection Time: 05/25/23  8:35 PM   Specimen: BLOOD  Result Value Ref Range Status   Specimen Description BLOOD BLOOD RIGHT FOREARM  Final   Special Requests   Final    BOTTLES DRAWN AEROBIC AND ANAEROBIC Blood Culture adequate volume   Culture   Final    NO GROWTH 2 DAYS Performed at [facility], [address], [place], [place]  [address]    Report Status PENDING  Incomplete  Blood culture (routine x 2)     Status: None (Preliminary result)   Collection Time: 05/25/23  8:35 PM   Specimen: BLOOD  Result Value Ref Range Status   Specimen Description BLOOD BLOOD RIGHT ARM  Final   Special Requests   Final    BOTTLES DRAWN AEROBIC  AND ANAEROBIC Blood Culture adequate volume   Culture   Final    NO GROWTH 2 DAYS Performed at [facility], [address], [place], [place] [address]    Report Status PENDING  Incomplete    Procedures and diagnostic studies:  CT ABDOMEN PELVIS W CONTRAST  Result Date: 05/25/2023 CLINICAL DATA:  Pt arrives via [facility] from home for diarrhea that started yesterday. Pt states the diarrhea and abd pain are consistent with her previous diagnosis for C. Diff. Pt A+Ox4. Pt reports that she did not complete vancomycin from previous C. Diff infection. EXAM: CT ABDOMEN AND PELVIS WITH CONTRAST TECHNIQUE: Multidetector CT imaging of the abdomen and pelvis was performed using the standard protocol following bolus administration of intravenous contrast. RADIATION DOSE REDUCTION: This exam was performed according to the departmental dose-optimization program which includes automated exposure control, adjustment of the mA and/or kV according to patient size and/or use of iterative reconstruction technique. CONTRAST:  75mL OMNIPAQUE IOHEXOL 300 MG/ML  SOLN COMPARISON:  02/27/2023 FINDINGS: Lower chest: No acute abnormality. Hepatobiliary: Normal liver. Gallbladder distended with some increased attenuation material dependently suggesting small stones or sludge. No wall thickening or pericholecystic fluid. No duct dilation. Pancreas: Partial fatty replacement. No pancreatic mass or inflammation. Spleen: Top-normal in size.  No mass. Adrenals/Urinary Tract: No adrenal masses. Chronic bilateral hydronephrosis, greater on the left. Marked left renal atrophy. Moderate right renal cortical thinning. Right intrarenal stones. Mild dilation of the proximal to mid left ureter. Increased enhancement of the left intrarenal and ureteral urothelium. Right ureter normal in course and in caliber. Bladder minimally distended, grossly unremarkable but well visualized due to artifact from bilateral hip prostheses. Kidney findings  are stable from the prior CT. Stomach/Bowel: 6 new stomach mostly decompressed, otherwise unremarkable. Small bowel is normal in caliber. No wall thickening. No inflammation. Possible wall thickening of the lower sigmoid colon and rectum, which are nondistended. Portions of the sigmoid colon are not well visualized due to artifact from prostheses. Remainder of the colon is normal in caliber. Mild wall thickening. Vascular/Lymphatic: Minor aortic atherosclerosis. No aneurysm. No enlarged lymph nodes. Reproductive: Status post hysterectomy. No pelvic mass. Pelvic sidewall vascular clips. Vascular clips extend into the lower retroperitoneum. Other: No ascites. Musculoskeletal: Chronic vertebral fractures, T12, L1 and L5, unchanged. No acute fracture. No bone lesion. Bilateral total hip arthroplasties. Lucency adjacent to superior acetabular component the right, unchanged from the prior CT. There is fluid attenuation the soft tissue extending lateral from the right hip that has decreased in size compared to the prior exam. IMPRESSION: 1. Mild colonic wall thickening consistent with the given history of C. difficile infection. No evidence of an abscess. No bowel obstruction. 2. No other acute abnormality within the abdomen or pelvis. 3. Multiple chronic abnormalities detailed above similar to the prior CT. Electronically Signed   By: Amie Portland M.D.   On: 05/25/2023 17:44               LOS: 1 day   Melville Engen  [facility]   Pager on [email or web address]. If 7PM-7AM, please contact night-coverage at [email or web address]  05/27/2023, 4:15 PM

## 2023-05-27 NOTE — TOC Benefit Eligibility Note (Signed)
Patient Product/process development scientist completed.    The patient is insured through [employer]. Patient has Medicare and is not eligible for a copay card, but may be able to apply for patient assistance, if available.    Ran test claim for Dificid 200 mg and the current 10 day co-pay is $1,596.21.   This test claim was processed through [facility]- copay amounts may vary at other pharmacies due to pharmacy/plan contracts, or as the patient moves through the different stages of their insurance plan.     Roland Earl, CPHT Pharmacy Technician III Certified Patient [facility] Patient Advocate Team Direct Number: [phone]  Fax: [phone]

## 2023-05-27 NOTE — ED Notes (Signed)
Pt IV infiltrated while meropenem infusing. Pharmacy consulted for guidance on treatment of area. IV removed. Cold pack placed to site.

## 2023-05-27 NOTE — Consult Note (Signed)
[facility] Customer service manager [facility]) Accountable Care Organization (ACO) [facility] Liaison Note  05/27/2023  BRITINI MINJARES [birth year] [id number]  Location: [facility] RN Hospital Liaison screened the patient remotely at [facility].  Insurance: Manvitha Braman is a 73 y.o. female who is a Primary Care Patient of Jerl Mina, MD [facility]) The patient was screened for  day readmission hospitalization with noted extreme risk score for unplanned readmission risk with 4 IP in 6 months.  The patient was assessed for potential [facility]) Care Management service needs for post hospital transition for care coordination. Review of patient's electronic medical record reveals patient was  C.Diff. Note pt recently declined [facility] care management services in July. No anticipated needs at this time noted via TOC.  Plan: [facility] Liaison will continue to follow progress and disposition to asess for post hospital community care coordination/management needs.  Referral request for community care coordination: pending disposition.   [facility] Care Management/Population Health does not replace or interfere with any arrangements made by the Inpatient Transition of Care team.   For questions contact:   Elliot Cousin, RN, [facility] Liaison [place]   Population Health Office Hours MTWF  8:00 am-6:00 pm [phone] mobile [phone] [Office toll free line] Office Hours are M-F 8:30 - 5 pm [email or web address]

## 2023-05-27 NOTE — ED Notes (Signed)
Pt cleaned with barrier wipes. Incontinence brief changed and new absorbent pad placed. Barrier cream applied to buttocks and groin.

## 2023-05-28 DIAGNOSIS — A0472 Enterocolitis due to Clostridium difficile, not specified as recurrent: Secondary | ICD-10-CM | POA: Diagnosis not present

## 2023-05-28 LAB — URINE CULTURE: Culture: >=100000 — AB

## 2023-05-28 LAB — BASIC METABOLIC PANEL
Anion gap: 7 (ref 5–15)
BUN: 12 mg/dL (ref 8–23)
CO2: 20 mmol/L — ABNORMAL LOW (ref 22–32)
Calcium: 8.2 mg/dL — ABNORMAL LOW (ref 8.9–10.3)
Chloride: 114 mmol/L — ABNORMAL HIGH (ref 98–111)
Creatinine, Ser: 1.11 mg/dL — ABNORMAL HIGH (ref 0.44–1.00)
GFR, Estimated: 53 mL/min — ABNORMAL LOW (ref >60)
Glucose, Bld: 100 mg/dL — ABNORMAL HIGH (ref 70–99)
Potassium: 3.7 mmol/L (ref 3.5–5.1)
Sodium: 141 mmol/L (ref 135–145)

## 2023-05-28 LAB — MAGNESIUM: Magnesium: 2.4 mg/dL (ref 1.7–2.4)

## 2023-05-28 MED ORDER — PROSOURCE PLUS PO LIQD
30.0000 mL | Freq: Two times a day (BID) | ORAL | Status: DC
  Administered 2023-05-29 – 2023-06-02 (×6): 30 mL via ORAL
  Filled 2023-05-28 (×10): qty 30

## 2023-05-28 MED ORDER — ENOXAPARIN SODIUM 40 MG/0.4ML IJ SOSY
40.0000 mg | PREFILLED_SYRINGE | INTRAMUSCULAR | Status: DC
  Administered 2023-05-28 – 2023-06-01 (×5): 40 mg via SUBCUTANEOUS
  Filled 2023-05-28 (×6): qty 0.4

## 2023-05-28 MED ORDER — SULFAMETHOXAZOLE-TRIMETHOPRIM 800-160 MG PO TABS: 1.0000 | ORAL_TABLET | Freq: Two times a day (BID) | ORAL | Status: DC

## 2023-05-28 MED ORDER — JUVEN PO PACK
1.0000 | PACK | Freq: Two times a day (BID) | ORAL | Status: DC
  Administered 2023-05-29 – 2023-06-02 (×8): 1 via ORAL

## 2023-05-28 NOTE — Plan of Care (Signed)
  Problem: Fluid Volume: Goal: Hemodynamic stability will improve Outcome: Progressing   Problem: Nutrition: Goal: Adequate nutrition will be maintained Outcome: Progressing   Problem: Elimination: Goal: Will not experience complications related to bowel motility Outcome: Progressing   Problem: Pain Managment: Goal: General experience of comfort will improve Outcome: Progressing   Problem: Skin Integrity: Goal: Risk for impaired skin integrity will decrease Outcome: Progressing

## 2023-05-28 NOTE — Progress Notes (Signed)
Progress Note    Jeanne [name]  ZOX:[id number] DOB: 12-07-1950  DOA: 05/25/2023 PCP: Jerl Mina, MD      Brief Narrative:    Medical records reviewed and are as summarized below:  Jeanne [name] is a 72 y.o. female with medical history significant for cervical cancer, rheumatoid arthritis, hypertension, nephrolithiasis, left UPJ urolithiasis, CKD stage IIIb, COVID-19 infection, C. difficile infection requiring hospitalization from 11/09/2022 to 11/12/2022, recurrent C. difficile infection requiring hospitalization from 12/27/2022 through 01/19/2023, Campylobacter colitis with negative C. difficile hospitalized from 02/10/2023 through 02/17/2023, hospitalized from 02/27/2023 through 03/02/2023 for diarrhea with negative C. difficile test.  She presented to the hospital because of diarrhea that started the day prior to admission.  Also complained of pain in urination and increased frequency of micturition.  She was found to have recurrent C. difficile diarrhea and acute UTI.       Assessment/Plan:   Principal Problem:   Clostridium difficile diarrhea Active Problems:   Hypotension   CKD stage 3b, GFR 30-44 ml/min (HCC)   UTI (urinary tract infection)   Severe sepsis with septic shock (HCC)    Body mass index is 24.61 kg/m.   Sepsis secondary to recurrent C. difficile colitis: Diarrhea is improving.  Leukocytosis  has improved.  Continue fidaxomicin. Previous positive C. difficile test on 11/09/2022, 12/29/2022 and 05/25/2023. Patient has been advised she would have to follow-up with gastroenterologist to discuss possible stool transplant if C. difficile infection recurs.   Acute UTI with dysuria and increased frequency of micturition:  Urine culture showed Staphylococcus aureus and Klebsiella oxytoca Continue IV meropenem for total of 5 days t (to be completed on 05/29/2023)   Hypotension: It appears patient has chronic/intermittent hypotension and she was taking midodrine  at home.  Continue midodrine and monitor BP.    Hypomagnesemia and hypokalemia: Improved.     General weakness: PT evaluation.   Other comorbidities include CKD stage IIIb, chronic bilateral hydronephrosis, left UPJ urolithiasis, recurrent UTIs, history of cervical cancer, rheumatoid arthritis   Diet Order             Diet regular Fluid consistency: Thin  Diet effective now                            Consultants: None  Procedures: None    Medications:    enoxaparin (LOVENOX) injection  40 mg Subcutaneous Q24H   famotidine  20 mg Oral Daily   feeding supplement  237 mL Oral BID BM   fidaxomicin  200 mg Oral BID   gabapentin  100 mg Oral QHS   midodrine  10 mg Oral TID WC   sodium chloride flush  3 mL Intravenous Q12H   Continuous Infusions:  meropenem (MERREM) IV 1 g (05/28/23 1011)     Anti-infectives (From admission, onward)    Start     Dose/Rate Route Frequency Ordered Stop   05/28/23 1000  sulfamethoxazole-trimethoprim (BACTRIM DS) 800-160 MG per tablet 1 tablet  Status:  Discontinued        1 tablet Oral Every 12 hours 05/28/23 0823 05/28/23 0931   05/26/23 1000  fidaxomicin (DIFICID) tablet 200 mg        200 mg Oral 2 times daily 05/26/23 0845     05/26/23 0900  meropenem (MERREM) 1 g in sodium chloride 0.9 % 100 mL IVPB        1 g 200 mL/hr over 30 Minutes  Intravenous Every 12 hours 05/25/23 2150 05/29/23 2200   05/26/23 0500  metroNIDAZOLE (FLAGYL) IVPB 500 mg  Status:  Discontinued        500 mg 100 mL/hr over 60 Minutes Intravenous Every 8 hours 05/25/23 2142 05/26/23 0851   05/25/23 2200  vancomycin (VANCOCIN) capsule 500 mg  Status:  Discontinued        500 mg Oral Every 6 hours 05/25/23 2142 05/26/23 0845   05/25/23 2015  meropenem (MERREM) 1 g in sodium chloride 0.9 % 100 mL IVPB        1 g 200 mL/hr over 30 Minutes Intravenous  Once 05/25/23 2006 05/25/23 2112   05/25/23 2000  vancomycin (VANCOCIN) capsule 500 mg  Status:   Discontinued        500 mg Oral Every 6 hours 05/25/23 1945 05/25/23 2016   05/25/23 2000  metroNIDAZOLE (FLAGYL) IVPB 500 mg  Status:  Discontinued        500 mg 100 mL/hr over 60 Minutes Intravenous Every 8 hours 05/25/23 1945 05/25/23 2148   05/25/23 1645  cefTRIAXone (ROCEPHIN) 1 g in sodium chloride 0.9 % 100 mL IVPB        1 g 200 mL/hr over 30 Minutes Intravenous  Once 05/25/23 1640 05/25/23 1739              Family Communication/Anticipated D/C date and plan/Code Status   DVT prophylaxis: enoxaparin (LOVENOX) injection 40 mg Start: 05/28/23 1200     Code Status: Full Code  Family Communication: None Disposition Plan: Plan to discharge home   Status is: Inpatient Remains inpatient appropriate because: C. difficile diarrhea       Subjective:   Interval events noted.  She complains of diarrhea.  She thinks that it is less voluminous and less watery.  No abdominal pain or vomiting.   Objective:    Vitals:   05/27/23 2000 05/28/23 0433 05/28/23 1002 05/28/23 1508  BP:  (!) 127/59 (!) 115/96 (!) 141/61  Pulse:  (!) 55 72 63  Resp:  18 18 17   Temp:  97.8 F (36.6 C) 98.1 F (36.7 C)   TempSrc:  Oral Oral   SpO2: 100% 98% 99% 100%  Weight:      Height:       No data found.  No intake or output data in the 24 hours ending 05/28/23 1520  Filed Weights   05/25/23 2018  Weight: 65 kg    Exam:   GEN: NAD SKIN: Warm and dry EYES: No pallor or icterus ENT: MMM CV: RRR PULM: CTA B ABD: soft, ND, NT, +BS CNS: AAO x 3, non focal EXT: No edema or tenderness       Data Reviewed:   I have personally reviewed following labs and imaging studies:  Labs: Labs show the following:   Basic Metabolic Panel: Recent Labs  Lab 05/25/23 1610 05/26/23 0932 05/27/23 0636 05/28/23 0724  NA 135 138 137 141  K 3.6 3.7 3.2* 3.7  CL 106 110 110 114*  CO2 18* 18* 18* 20*  GLUCOSE 100* 104* 75 100*  BUN 17 16 14 12   CREATININE 1.03* 1.06* 1.16*  1.11*  CALCIUM 7.6* 7.6* 7.9* 8.2*  MG  --  1.5* 2.4 2.4  PHOS  --  2.6 2.9  --    GFR Estimated Creatinine Clearance: 40.7 mL/min (A) (by C-G formula based on SCr of 1.11 mg/dL (H)). Liver Function Tests: Recent Labs  Lab 05/25/23 1610 05/26/23 0932  AST  15 15  ALT 11 12  ALKPHOS 91 84  BILITOT 0.7 1.0  PROT 4.9* 4.9*  ALBUMIN 2.0* 2.3*   Recent Labs  Lab 05/25/23 1610  LIPASE 15   No results for input(s): "AMMONIA" in the last 168 hours. Coagulation profile No results for input(s): "INR", "PROTIME" in the last 168 hours.  CBC: Recent Labs  Lab 05/25/23 1211 05/26/23 0932 05/27/23 0636  WBC 18.7* 14.0* 7.0  NEUTROABS  --   --  6.1  HGB 12.6 10.1* 10.6*  HCT 39.5 32.7* 34.8*  MCV 88.4 90.3 91.3  PLT 306 243 214   Cardiac Enzymes: No results for input(s): "CKTOTAL", "CKMB", "CKMBINDEX", "TROPONINI" in the last 168 hours. BNP (last 3 results) No results for input(s): "PROBNP" in the last 8760 hours. CBG: No results for input(s): "GLUCAP" in the last 168 hours. D-Dimer: No results for input(s): "DDIMER" in the last 72 hours. Hgb A1c: No results for input(s): "HGBA1C" in the last 72 hours. Lipid Profile: No results for input(s): "CHOL", "HDL", "LDLCALC", "TRIG", "CHOLHDL", "LDLDIRECT" in the last 72 hours. Thyroid function studies: No results for input(s): "TSH", "T4TOTAL", "T3FREE", "THYROIDAB" in the last 72 hours.  Invalid input(s): "FREET3" Anemia work up: No results for input(s): "VITAMINB12", "FOLATE", "FERRITIN", "TIBC", "IRON", "RETICCTPCT" in the last 72 hours. Sepsis Labs: Recent Labs  Lab 05/25/23 1211 05/25/23 2022 05/26/23 0932 05/27/23 0636  WBC 18.7*  --  14.0* 7.0  LATICACIDVEN  --  1.3 1.0 1.0    Microbiology Recent Results (from the past 240 hour(s))  Urine Culture     Status: Abnormal   Collection Time: 05/25/23  4:20 PM   Specimen: Urine, Clean Catch  Result Value Ref Range Status   Specimen Description   Final    URINE,  CLEAN CATCH Performed at [facility], [address], [place], [place] [address]    Special Requests   Final    NONE Performed at [facility], [address] [place], [place] [address]    Culture (A)  Final    >=100,000 COLONIES/mL STAPHYLOCOCCUS AUREUS 30,000 COLONIES/mL KLEBSIELLA OXYTOCA Confirmed Extended Spectrum Beta-Lactamase Producer (ESBL).  In bloodstream infections from ESBL organisms, carbapenems are preferred over piperacillin/tazobactam. They are shown to have a lower risk of mortality.    Report Status 05/28/2023 FINAL  Final   Organism ID, Bacteria STAPHYLOCOCCUS AUREUS (A)  Final   Organism ID, Bacteria KLEBSIELLA OXYTOCA (A)  Final      Susceptibility   Klebsiella oxytoca - MIC*    AMPICILLIN >=32 RESISTANT Resistant     CEFEPIME >=32 RESISTANT Resistant     CEFTRIAXONE >=64 RESISTANT Resistant     CIPROFLOXACIN >=4 RESISTANT Resistant     GENTAMICIN >=16 RESISTANT Resistant     IMIPENEM <=0.25 SENSITIVE Sensitive     NITROFURANTOIN 64 INTERMEDIATE Intermediate     TRIMETH/SULFA >=320 RESISTANT Resistant     AMPICILLIN/SULBACTAM >=32 RESISTANT Resistant     PIP/TAZO 32 INTERMEDIATE Intermediate     * 30,000 COLONIES/mL KLEBSIELLA OXYTOCA   Staphylococcus aureus - MIC*    CIPROFLOXACIN <=0.5 SENSITIVE Sensitive     GENTAMICIN <=0.5 SENSITIVE Sensitive     NITROFURANTOIN <=16 SENSITIVE Sensitive     OXACILLIN <=0.25 SENSITIVE Sensitive     TETRACYCLINE <=1 SENSITIVE Sensitive     VANCOMYCIN <=0.5 SENSITIVE Sensitive     TRIMETH/SULFA <=10 SENSITIVE Sensitive     CLINDAMYCIN <=0.25 SENSITIVE Sensitive     RIFAMPIN <=0.5 SENSITIVE Sensitive  Inducible Clindamycin NEGATIVE Sensitive     LINEZOLID 2 SENSITIVE Sensitive     * >=100,000 COLONIES/mL STAPHYLOCOCCUS AUREUS  C Difficile Quick Screen w PCR reflex     Status: Abnormal   Collection Time: 05/25/23  8:22 PM   Specimen: STOOL  Result Value Ref Range Status   C Diff antigen  POSITIVE (A) NEGATIVE Final   C Diff toxin POSITIVE (A) NEGATIVE Final   C Diff interpretation Toxin producing C. difficile detected.  Final    Comment: CRITICAL RESULT CALLED TO, READ BACK BY AND VERIFIED WITH: Glo Herring RN ED @ 2106 05/25/23 lfd Performed at [facility], [address] [place], [place] [address]   Blood culture (routine x 2)     Status: None (Preliminary result)   Collection Time: 05/25/23  8:35 PM   Specimen: BLOOD  Result Value Ref Range Status   Specimen Description BLOOD BLOOD RIGHT FOREARM  Final   Special Requests   Final    BOTTLES DRAWN AEROBIC AND ANAEROBIC Blood Culture adequate volume   Culture   Final    NO GROWTH 3 DAYS Performed at [facility], [address] [place], [place] [address]    Report Status PENDING  Incomplete  Blood culture (routine x 2)     Status: None (Preliminary result)   Collection Time: 05/25/23  8:35 PM   Specimen: BLOOD  Result Value Ref Range Status   Specimen Description BLOOD BLOOD RIGHT ARM  Final   Special Requests   Final    BOTTLES DRAWN AEROBIC AND ANAEROBIC Blood Culture adequate volume   Culture   Final    NO GROWTH 3 DAYS Performed at [facility], [address], [place], [place] [address]    Report Status PENDING  Incomplete    Procedures and diagnostic studies:  No results found.             LOS: 2 days   Sorah Falkenstein  [facility]   Pager on [email or web address]. If 7PM-7AM, please contact night-coverage at [email or web address]     05/28/2023, 3:20 PM

## 2023-05-28 NOTE — TOC Initial Note (Signed)
Transition of Care [facility]) - Initial/Assessment Note    Patient Details  Name: Jeanne [name] MRN: [id number] Date of Birth: 1951-06-28  Transition of Care [facility]) CM/SW Contact:    Darolyn Rua, LCSW Phone Number: 05/28/2023, 2:14 PM  Clinical Narrative:                  PCP: Jerl Mina Insurance: [employer]ia Pouch Medicare HH : [facility] Presents from home with diarrhea. No identified needs at this time.    Expected Discharge Plan: Home w Home Health Services Barriers to Discharge: Continued Medical Work up   Patient Goals and CMS Choice Patient states their goals for this hospitalization and ongoing recovery are:: to go home CMS Medicare.gov Compare Post Acute Care list provided to:: Patient        Expected Discharge Plan and Services       Living arrangements for the past 2 months: Single Family Home                                      Prior Living Arrangements/Services Living arrangements for the past 2 months: Single Family Home Lives with:: Self                   Activities of Daily Living Home Assistive Devices/Equipment: Eyeglasses, Shower chair with back, Tub transfer bench, Wheelchair, Environmental consultant (specify type), Hospital bed ADL Screening (condition at time of admission) Patient's cognitive ability adequate to safely complete daily activities?: Yes Is the patient deaf or have difficulty hearing?: No Does the patient have difficulty seeing, even when wearing glasses/contacts?: No Does the patient have difficulty concentrating, remembering, or making decisions?: No Patient able to express need for assistance with ADLs?: Yes Does the patient have difficulty dressing or bathing?: Yes Independently performs ADLs?: No Communication: Independent Dressing (OT): Needs assistance Is this a change from baseline?: Pre-admission baseline Grooming: Independent Feeding: Independent Bathing: Needs assistance Is this a change from baseline?:  Pre-admission baseline Toileting: Needs assistance Is this a change from baseline?: Pre-admission baseline In/Out Bed: Needs assistance Is this a change from baseline?: Pre-admission baseline Walks in Home: Dependent Is this a change from baseline?: Pre-admission baseline Does the patient have difficulty walking or climbing stairs?: Yes Weakness of Legs: Both Weakness of Arms/Hands: Both  Permission Sought/Granted                  Emotional Assessment              Admission diagnosis:  Diarrhea [R19.7] Diarrhea, unspecified type [R19.7] Patient Active Problem List   Diagnosis Date Noted   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) 02/27/2023   Elevated alkaline phosphatase level 02/11/2023   SIRS (systemic inflammatory response syndrome) (HCC) 02/11/2023   Diarrhea of presumed infectious origin 02/11/2023   Colitis due to Campylobacter species 02/10/2023   Asymptomatic bacteriuria 02/10/2023   Difficulty walking 02/10/2023   Hypomagnesemia 01/08/2023   Hypokalemia 01/08/2023   CKD stage 3b, GFR 30-44 ml/min (HCC) 01/03/2023   Hypotension 01/02/2023   Pancolitis (HCC) 12/29/2022   Shock circulatory (HCC) 12/28/2022   Severe sepsis with septic shock (HCC) 11/10/2022   Nausea vomiting and diarrhea 11/09/2022   Clostridium difficile diarrhea 11/09/2022   COVID-19 virus infection 11/09/2022   AKI (acute kidney injury) (HCC) 11/09/2022   Anemia 11/09/2022   Fungal infection of skin 09/23/2019   UTI (urinary tract infection)  07/15/2017   Dysuria 01/28/2017   Chronic radiation cystitis 02/28/2015   Coitalgia 02/28/2015   Blood pressure elevated 02/28/2015   Calculus of kidney 02/28/2015   Arthritis or polyarthritis, rheumatoid (HCC) 02/28/2015   Basal cell papilloma 02/28/2015   H/O cataract extraction 09/22/2014   Combined form of senile cataract 07/07/2014   NS (nuclear sclerosis) 05/21/2014   History of repair of hip joint 03/16/2013   H/O total knee  replacement 03/16/2013   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 [place] [address] Phone: [phone] Fax: [phone]     Social Determinants of Health (SDOH) Social History: SDOH Screenings   Food Insecurity: No Food Insecurity (05/27/2023)  Housing: Low Risk  (05/27/2023)  Transportation Needs: No Transportation Needs (05/27/2023)  Utilities: Not At Risk (05/27/2023)  Alcohol Screen: Low Risk  (08/10/2020)  Depression (PHQ2-9): Low Risk  (08/10/2020)  Financial Resource Strain: Low Risk  (04/24/2022)   Received from [facility]  Physical Activity: Inactive (08/10/2020)  Social Connections: Moderately Integrated (08/10/2020)  Stress: No Stress Concern Present (08/10/2020)  Tobacco Use: Medium Risk (05/27/2023)   SDOH Interventions:     Readmission Risk Interventions    03/01/2023   10:56 AM 01/02/2023   12:24 PM  Readmission Risk Prevention Plan  Transportation Screening Complete Complete  PCP or Specialist Appt within 3-5 Days Complete Complete  HRI or Home Care Consult Complete   Social Work Consult for Recovery Care Planning/Counseling Complete Complete  Palliative Care Screening Not Applicable Not Applicable  Medication Review Oceanographer) Complete Complete

## 2023-05-28 NOTE — Progress Notes (Signed)
Initial Nutrition Assessment  DOCUMENTATION CODES:   Not applicable  INTERVENTION:   Prostat liquid protein PO 30 ml BID with meals, each supplement provides 100 kcal, 15 grams protein.  Juven Fruit Punch po BID, each serving provides 95kcal and 2.5g of protein (amino acids glutamine and arginine)  MVI po daily   Check vitamins A, D, B12, folate, zinc and copper  NUTRITION DIAGNOSIS:   Increased nutrient needs related to acute illness as evidenced by estimated needs.  GOAL:   Patient will meet greater than or equal to 90% of their needs  MONITOR:   PO intake, Supplement acceptance, Labs, Weight trends, I & O's, Skin  REASON FOR ASSESSMENT:   Malnutrition Screening Tool    ASSESSMENT:   72 y/o female with h/o HTN, cervical cancer, RA, recurrent C. diff, CKD III, nephrolithiasis, left UPJ urolithiasis and recent admission for pancolitis and sepsis who is admitted with recurrent C. diff, UTI and sepsis.  Met with pt in room today. Pt is well known to this RD from a recent previous admission. Pt reports improved appetite and oral intake since her last admission. Per chart, pt has gained ~18lbs since June. Pt with a sacral wound which is healing. Pt reports that she has been drinking Juven daily at home. Pt does not like Boost or Ensure; pt is willing to try ProSource in hospital. RD discussed with pt the importance of adequate nutrition needed to preserve lean muscle. RD will add supplements and MVI to help pt meet her estimated needs. Pt reports that she is still taking her probiotics daily. Pt with numerous vitamin deficiencies noted last admission; pt reports that she had been taking her vitamin supplementation up until a few weeks ago. RD will recheck vitamin labs to ensure levels have returned to normal; this was discussed with patient.   Medications reviewed and include: lovenox, pepcid, dificid, meropenem   Labs reviewed: K 3.7 wnl, creat 1.11(H), Mg 2.4 wnl P 2.9 wnl-  9/16 Hgb 10.6(L), Hct 34.8(L)  NUTRITION - FOCUSED PHYSICAL EXAM:  Flowsheet Row Most Recent Value  Orbital Region No depletion  Upper Arm Region No depletion  Thoracic and Lumbar Region No depletion  Buccal Region No depletion  Temple Region Mild depletion  Clavicle Bone Region Mild depletion  Clavicle and Acromion Bone Region Mild depletion  Scapular Bone Region No depletion  Dorsal Hand No depletion  Patellar Region No depletion  Anterior Thigh Region No depletion  Posterior Calf Region No depletion  Edema (RD Assessment) None  Hair Reviewed  Eyes Reviewed  Mouth Reviewed  Skin Reviewed  Nails Reviewed   Diet Order:   Diet Order             Diet regular Fluid consistency: Thin  Diet effective now                  EDUCATION NEEDS:   Education needs have been addressed  Skin:  Skin Assessment: Reviewed RN Assessment (Stage I perinium)  Last BM:  9/17- type 7  Height:   Ht Readings from Last 1 Encounters:  05/27/23 5\' 4"  (1.626 m)    Weight:   Wt Readings from Last 1 Encounters:  05/28/23 66.7 kg    Ideal Body Weight:  54.5 kg  BMI:  Body mass index is 25.24 kg/m.  Estimated Nutritional Needs:   Kcal:  1500-1700kcal/day  Protein:  75-85g/day  Fluid:  1.4-1.6L/day  Betsey Holiday MS, RD, LDN Please refer to [facility] for RD and/or RD  on-call/weekend/after hours pager

## 2023-05-29 DIAGNOSIS — A0472 Enterocolitis due to Clostridium difficile, not specified as recurrent: Secondary | ICD-10-CM | POA: Diagnosis not present

## 2023-05-29 MED ORDER — ACETAMINOPHEN 325 MG PO TABS
650.0000 mg | ORAL_TABLET | Freq: Four times a day (QID) | ORAL | Status: DC | PRN
  Administered 2023-05-31 – 2023-06-01 (×3): 650 mg via ORAL
  Filled 2023-05-29 (×6): qty 2

## 2023-05-29 MED ORDER — GERHARDT'S BUTT CREAM
TOPICAL_CREAM | Freq: Two times a day (BID) | CUTANEOUS | Status: DC
  Filled 2023-05-29: qty 1

## 2023-05-29 MED ORDER — ACETAMINOPHEN 650 MG RE SUPP: 650.0000 mg | Freq: Four times a day (QID) | RECTAL | Status: DC | PRN

## 2023-05-29 NOTE — Progress Notes (Signed)
PHARMACY - PHYSICIAN COMMUNICATION PROGRESS NOTE  Patient is currently on Dificid 200 mg BID for recurrent C.difficile.   Test claim for Dificid 200 mg was ran and the current 10 day co-pay is $1,596.21.   Given this costly co-pay, I have initiated an application for Dificid through [employer]. Patient is currently in the process of signing the application and will have daughter email application once completed.   Pending approval through [employer], will send script to outpatient pharmacy.   Littie Deeds, PharmD PGY1 Pharmacy Resident 05/29/2023  4:28 PM

## 2023-05-29 NOTE — Plan of Care (Signed)
Plan of care ongoing. Pt progressing in all areas.

## 2023-05-29 NOTE — Progress Notes (Signed)
PROGRESS NOTE    Jeanne [name]  ZOX:[id number] DOB: [birth year] DOA: 05/25/2023 PCP: Jerl Mina, MD    Brief Narrative:   Jeanne [name] is a 72 y.o. female with medical history significant for cervical cancer, rheumatoid arthritis, hypertension, nephrolithiasis, left UPJ urolithiasis, CKD stage IIIb, COVID-19 infection, C. difficile infection requiring hospitalization from 11/09/2022 to 11/12/2022, recurrent C. difficile infection requiring hospitalization from 12/27/2022 through 01/19/2023, Campylobacter colitis with negative C. difficile hospitalized from 02/10/2023 through 02/17/2023, hospitalized from 02/27/2023 through 03/02/2023 for diarrhea with negative C. difficile test.  She presented to the hospital because of diarrhea that started the day prior to admission.  Also complained of pain in urination and increased frequency of micturition.   She was found to have recurrent C. difficile diarrhea and acute UTI.   Assessment & Plan:   Principal Problem:   Clostridium difficile diarrhea Active Problems:   Hypotension   CKD stage 3b, GFR 30-44 ml/min (HCC)   UTI (urinary tract infection)   Severe sepsis with septic shock (HCC)  Sepsis secondary to recurrent C. difficile colitis: Diarrhea is improving.  Leukocytosis  has improved.   Plan: Continue Dificid course x 10 days Outpatient referral to GI for consideration of fecal transplant  Acute UTI with dysuria and increased frequency of micturition:   Urine culture showed Staphylococcus aureus and Klebsiella oxytoca Completed 5-day course of meropenem on 9/18     Hypotension: It appears patient has chronic/intermittent hypotension and she was taking midodrine at home.  Blood pressure has improved so we will discontinue midodrine at this time.  Monitor BP.     Hypomagnesemia and hypokalemia: Improved.       General weakness: PT evaluation.     Other comorbidities include CKD stage IIIb, chronic bilateral hydronephrosis, left UPJ  urolithiasis, recurrent UTIs, history of cervical cancer, rheumatoid arthritis    DVT prophylaxis: SQ Lovenox Code Status: Full Family Communication: Husband via phone on 9/18 Disposition Plan: Status is: Inpatient Remains inpatient appropriate because: Refractory C. difficile colitis   Level of care: Med-Surg  Consultants:  None  Procedures:  None  Antimicrobials: Dificid   Subjective: Seen and examined.  Resting bed.  No visible distress.  Reports the diarrhea is overall improved but did have few episodes last night.  Objective: Vitals:   05/28/23 1755 05/28/23 1937 05/29/23 0346 05/29/23 0800  BP: 138/70 (!) 149/85 128/70 139/65  Pulse:  64 61 (!) 58  Resp:  18 18 18   Temp:  98.1 F (36.7 C) 97.7 F (36.5 C) 98.1 F (36.7 C)  TempSrc:  Oral Oral Oral  SpO2:  100% 100% 100%  Weight:      Height:        Intake/Output Summary (Last 24 hours) at 05/29/2023 1447 Last data filed at 05/29/2023 0405 Gross per 24 hour  Intake 118 ml  Output 300 ml  Net -182 ml   Filed Weights   05/25/23 2018 05/28/23 1635  Weight: 65 kg 66.7 kg    Examination:  General exam: No acute distress Respiratory system: Clear to auscultation. Respiratory effort normal. Cardiovascular system: S1-S2, RRR, no murmurs, no pedal edema Gastrointestinal system: Soft, nontender, nondistended, hyperactive bowel sounds Central nervous system: Alert and oriented. No focal neurological deficits. Extremities: Symmetric 5 x 5 power. Skin: No rashes, lesions or ulcers Psychiatry: Judgement and insight appear normal. Mood & affect appropriate.     Data Reviewed: I have personally reviewed following labs and imaging studies  CBC: Recent Labs  Lab 05/25/23  1211 05/26/23 0932 05/27/23 0636  WBC 18.7* 14.0* 7.0  NEUTROABS  --   --  6.1  HGB 12.6 10.1* 10.6*  HCT 39.5 32.7* 34.8*  MCV 88.4 90.3 91.3  PLT 306 243 214   Basic Metabolic Panel: Recent Labs  Lab 05/25/23 1610 05/26/23 0932  05/27/23 0636 05/28/23 0724 05/29/23 0556  NA 135 138 137 141 140  K 3.6 3.7 3.2* 3.7 3.7  CL 106 110 110 114* 113*  CO2 18* 18* 18* 20* 22  GLUCOSE 100* 104* 75 100* 110*  BUN 17 16 14 12 10   CREATININE 1.03* 1.06* 1.16* 1.11* 1.09*  CALCIUM 7.6* 7.6* 7.9* 8.2* 8.1*  MG  --  1.5* 2.4 2.4 2.2  PHOS  --  2.6 2.9  --  2.2*   GFR: Estimated Creatinine Clearance: 45.1 mL/min (A) (by C-G formula based on SCr of 1.09 mg/dL (H)). Liver Function Tests: Recent Labs  Lab 05/25/23 1610 05/26/23 0932  AST 15 15  ALT 11 12  ALKPHOS 91 84  BILITOT 0.7 1.0  PROT 4.9* 4.9*  ALBUMIN 2.0* 2.3*   Recent Labs  Lab 05/25/23 1610  LIPASE 15   No results for input(s): "AMMONIA" in the last 168 hours. Coagulation Profile: No results for input(s): "INR", "PROTIME" in the last 168 hours. Cardiac Enzymes: No results for input(s): "CKTOTAL", "CKMB", "CKMBINDEX", "TROPONINI" in the last 168 hours. BNP (last 3 results) No results for input(s): "PROBNP" in the last 8760 hours. HbA1C: No results for input(s): "HGBA1C" in the last 72 hours. CBG: No results for input(s): "GLUCAP" in the last 168 hours. Lipid Profile: No results for input(s): "CHOL", "HDL", "LDLCALC", "TRIG", "CHOLHDL", "LDLDIRECT" in the last 72 hours. Thyroid Function Tests: No results for input(s): "TSH", "T4TOTAL", "FREET4", "T3FREE", "THYROIDAB" in the last 72 hours. Anemia Panel: Recent Labs    05/29/23 0556  VITAMINB12 1,260*  FOLATE 9.1   Sepsis Labs: Recent Labs  Lab 05/25/23 2022 05/26/23 0932 05/27/23 0636  LATICACIDVEN 1.3 1.0 1.0    Recent Results (from the past 240 hour(s))  Urine Culture     Status: Abnormal   Collection Time: 05/25/23  4:20 PM   Specimen: Urine, Clean Catch  Result Value Ref Range Status   Specimen Description   Final    URINE, CLEAN CATCH Performed at [facility], [address] [place], [place] [address]    Special Requests   Final    NONE Performed at [facility], [address] [place], [place] [address]    Culture (A)  Final    >=100,000 COLONIES/mL STAPHYLOCOCCUS AUREUS 30,000 COLONIES/mL KLEBSIELLA OXYTOCA Confirmed Extended Spectrum Beta-Lactamase Producer (ESBL).  In bloodstream infections from ESBL organisms, carbapenems are preferred over piperacillin/tazobactam. They are shown to have a lower risk of mortality.    Report Status 05/28/2023 FINAL  Final   Organism ID, Bacteria STAPHYLOCOCCUS AUREUS (A)  Final   Organism ID, Bacteria KLEBSIELLA OXYTOCA (A)  Final      Susceptibility   Klebsiella oxytoca - MIC*    AMPICILLIN >=32 RESISTANT Resistant     CEFEPIME >=32 RESISTANT Resistant     CEFTRIAXONE >=64 RESISTANT Resistant     CIPROFLOXACIN >=4 RESISTANT Resistant     GENTAMICIN >=16 RESISTANT Resistant     IMIPENEM <=0.25 SENSITIVE Sensitive     NITROFURANTOIN 64 INTERMEDIATE Intermediate     TRIMETH/SULFA >=320 RESISTANT Resistant     AMPICILLIN/SULBACTAM >=32 RESISTANT Resistant     PIP/TAZO 32 INTERMEDIATE Intermediate     *  30,000 COLONIES/mL KLEBSIELLA OXYTOCA   Staphylococcus aureus - MIC*    CIPROFLOXACIN <=0.5 SENSITIVE Sensitive     GENTAMICIN <=0.5 SENSITIVE Sensitive     NITROFURANTOIN <=16 SENSITIVE Sensitive     OXACILLIN <=0.25 SENSITIVE Sensitive     TETRACYCLINE <=1 SENSITIVE Sensitive     VANCOMYCIN <=0.5 SENSITIVE Sensitive     TRIMETH/SULFA <=10 SENSITIVE Sensitive     CLINDAMYCIN <=0.25 SENSITIVE Sensitive     RIFAMPIN <=0.5 SENSITIVE Sensitive     Inducible Clindamycin NEGATIVE Sensitive     LINEZOLID 2 SENSITIVE Sensitive     * >=100,000 COLONIES/mL STAPHYLOCOCCUS AUREUS  C Difficile Quick Screen w PCR reflex     Status: Abnormal   Collection Time: 05/25/23  8:22 PM   Specimen: STOOL  Result Value Ref Range Status   C Diff antigen POSITIVE (A) NEGATIVE Final   C Diff toxin POSITIVE (A) NEGATIVE Final   C Diff interpretation Toxin producing C. difficile detected.  Final    Comment:  CRITICAL RESULT CALLED TO, READ BACK BY AND VERIFIED WITH: Glo Herring RN ED @ 2106 05/25/23 lfd Performed at [facility], [address] [place], [place] [address]   Blood culture (routine x 2)     Status: None (Preliminary result)   Collection Time: 05/25/23  8:35 PM   Specimen: BLOOD  Result Value Ref Range Status   Specimen Description BLOOD BLOOD RIGHT FOREARM  Final   Special Requests   Final    BOTTLES DRAWN AEROBIC AND ANAEROBIC Blood Culture adequate volume   Culture   Final    NO GROWTH 4 DAYS Performed at [facility], [address] [place], [place] [address]    Report Status PENDING  Incomplete  Blood culture (routine x 2)     Status: None (Preliminary result)   Collection Time: 05/25/23  8:35 PM   Specimen: BLOOD  Result Value Ref Range Status   Specimen Description BLOOD BLOOD RIGHT ARM  Final   Special Requests   Final    BOTTLES DRAWN AEROBIC AND ANAEROBIC Blood Culture adequate volume   Culture   Final    NO GROWTH 4 DAYS Performed at [facility], [address] [place], [place] [address]    Report Status PENDING  Incomplete         Radiology Studies: No results found.      Scheduled Meds:  (feeding supplement) PROSource Plus  30 mL Oral BID BM   enoxaparin (LOVENOX) injection  40 mg Subcutaneous Q24H   famotidine  20 mg Oral Daily   fidaxomicin  200 mg Oral BID   gabapentin  100 mg Oral QHS   Gerhardt's butt cream   Topical BID   nutrition supplement (JUVEN)  1 packet Oral BID BM   sodium chloride flush  3 mL Intravenous Q12H   Continuous Infusions:  meropenem (MERREM) IV 1 g (05/29/23 0937)     LOS: 3 days      Tresa Moore, MD [facility]   If 7PM-7AM, please contact night-coverage  05/29/2023, 2:47 PM

## 2023-05-30 DIAGNOSIS — A0472 Enterocolitis due to Clostridium difficile, not specified as recurrent: Secondary | ICD-10-CM | POA: Diagnosis not present

## 2023-05-30 LAB — CBC
HCT: 32.9 % — ABNORMAL LOW (ref 36.0–46.0)
Hemoglobin: 10.3 g/dL — ABNORMAL LOW (ref 12.0–15.0)
MCH: 27.8 pg (ref 26.0–34.0)
MCHC: 31.3 g/dL (ref 30.0–36.0)
MCV: 88.7 fL (ref 80.0–100.0)
Platelets: 232 10*3/uL (ref 150–400)
RBC: 3.71 MIL/uL — ABNORMAL LOW (ref 3.87–5.11)
RDW: 14.2 % (ref 11.5–15.5)
WBC: 5.1 10*3/uL (ref 4.0–10.5)
nRBC: 0 % (ref 0.0–0.2)

## 2023-05-30 LAB — CULTURE, BLOOD (ROUTINE X 2)
Culture: NO GROWTH
Culture: NO GROWTH
Special Requests: ADEQUATE
Special Requests: ADEQUATE

## 2023-05-30 LAB — MISC LABCORP TEST (SEND OUT): Labcorp test code: 81950

## 2023-05-30 NOTE — Progress Notes (Signed)
PROGRESS NOTE    Jeanne [name]  VQQ:[id number] DOB: 03-17-1951 DOA: 05/25/2023 PCP: Jerl Mina, MD    Brief Narrative:   JANAEA [name] is a 72 y.o. female with medical history significant for cervical cancer, rheumatoid arthritis, hypertension, nephrolithiasis, left UPJ urolithiasis, CKD stage IIIb, COVID-19 infection, C. difficile infection requiring hospitalization from 11/09/2022 to 11/12/2022, recurrent C. difficile infection requiring hospitalization from 12/27/2022 through 01/19/2023, Campylobacter colitis with negative C. difficile hospitalized from 02/10/2023 through 02/17/2023, hospitalized from 02/27/2023 through 03/02/2023 for diarrhea with negative C. difficile test.  She presented to the hospital because of diarrhea that started the day prior to admission.  Also complained of pain in urination and increased frequency of micturition.   She was found to have recurrent C. difficile diarrhea and acute UTI.   Assessment & Plan:   Principal Problem:   Clostridium difficile diarrhea Active Problems:   Hypotension   CKD stage 3b, GFR 30-44 ml/min (HCC)   UTI (urinary tract infection)   Severe sepsis with septic shock (HCC)  Sepsis secondary to recurrent C. difficile colitis: Diarrhea is improving.  Leukocytosis  has improved.   Plan: Continue Dificid course x 10 days Yesterday consider day 1/10 given completion of course of meropenem Outpatient referral to GI for consideration of fecal transplant  Acute UTI with dysuria and increased frequency of micturition:   Urine culture showed Staphylococcus aureus and Klebsiella oxytoca Completed 5-day course of meropenem on 9/18 Monitor vitals and fever curve     Hypotension: It appears patient has chronic/intermittent hypotension and she was taking midodrine at home.  Blood pressure has improved so we will discontinue midodrine at this time.  BP stable     Hypomagnesemia and hypokalemia: Improved.       General weakness: PT  evaluation.     Other comorbidities include CKD stage IIIb, chronic bilateral hydronephrosis, left UPJ urolithiasis, recurrent UTIs, history of cervical cancer, rheumatoid arthritis    DVT prophylaxis: SQ Lovenox Code Status: Full Family Communication: Husband via phone on 9/18 Disposition Plan: Status is: Inpatient Remains inpatient appropriate because: Refractory C. difficile colitis   Level of care: Med-Surg  Consultants:  None  Procedures:  None  Antimicrobials: Dificid   Subjective: Seen and examined.  Resting in bed.  No visible distress.  Diarrhea improved.  Objective: Vitals:   05/29/23 2201 05/30/23 0456 05/30/23 0458 05/30/23 0700  BP: 123/71  (!) 121/56 137/80  Pulse: 75  67 72  Resp:   18 18  Temp: 98.5 F (36.9 C)  98.3 F (36.8 C)   TempSrc: Oral  Oral   SpO2: 98%  100% 99%  Weight:  66.7 kg    Height:        Intake/Output Summary (Last 24 hours) at 05/30/2023 1306 Last data filed at 05/30/2023 0858 Gross per 24 hour  Intake 240 ml  Output 600 ml  Net -360 ml   Filed Weights   05/25/23 2018 05/28/23 1635 05/30/23 0456  Weight: 65 kg 66.7 kg 66.7 kg    Examination:  General exam: NAD Respiratory system: Clear to auscultation. Respiratory effort normal. Cardiovascular system: S1-S2, RRR, no murmurs, no pedal edema Gastrointestinal system: Soft, nondistended, mild TTP, hyperactive bowel sounds Central nervous system: Alert and oriented. No focal neurological deficits. Extremities: Symmetric 5 x 5 power. Skin: No rashes, lesions or ulcers Psychiatry: Judgement and insight appear normal. Mood & affect appropriate.     Data Reviewed: I have personally reviewed following labs and imaging studies  CBC:  Recent Labs  Lab 05/25/23 1211 05/26/23 0932 05/27/23 0636 05/30/23 0458  WBC 18.7* 14.0* 7.0 5.1  NEUTROABS  --   --  6.1  --   HGB 12.6 10.1* 10.6* 10.3*  HCT 39.5 32.7* 34.8* 32.9*  MCV 88.4 90.3 91.3 88.7  PLT 306 243 214 232    Basic Metabolic Panel: Recent Labs  Lab 05/25/23 1610 05/26/23 0932 05/27/23 0636 05/28/23 0724 05/29/23 0556  NA 135 138 137 141 140  K 3.6 3.7 3.2* 3.7 3.7  CL 106 110 110 114* 113*  CO2 18* 18* 18* 20* 22  GLUCOSE 100* 104* 75 100* 110*  BUN 17 16 14 12 10   CREATININE 1.03* 1.06* 1.16* 1.11* 1.09*  CALCIUM 7.6* 7.6* 7.9* 8.2* 8.1*  MG  --  1.5* 2.4 2.4 2.2  PHOS  --  2.6 2.9  --  2.2*   GFR: Estimated Creatinine Clearance: 45.1 mL/min (A) (by C-G formula based on SCr of 1.09 mg/dL (H)). Liver Function Tests: Recent Labs  Lab 05/25/23 1610 05/26/23 0932  AST 15 15  ALT 11 12  ALKPHOS 91 84  BILITOT 0.7 1.0  PROT 4.9* 4.9*  ALBUMIN 2.0* 2.3*   Recent Labs  Lab 05/25/23 1610  LIPASE 15   No results for input(s): "AMMONIA" in the last 168 hours. Coagulation Profile: No results for input(s): "INR", "PROTIME" in the last 168 hours. Cardiac Enzymes: No results for input(s): "CKTOTAL", "CKMB", "CKMBINDEX", "TROPONINI" in the last 168 hours. BNP (last 3 results) No results for input(s): "PROBNP" in the last 8760 hours. HbA1C: No results for input(s): "HGBA1C" in the last 72 hours. CBG: No results for input(s): "GLUCAP" in the last 168 hours. Lipid Profile: No results for input(s): "CHOL", "HDL", "LDLCALC", "TRIG", "CHOLHDL", "LDLDIRECT" in the last 72 hours. Thyroid Function Tests: No results for input(s): "TSH", "T4TOTAL", "FREET4", "T3FREE", "THYROIDAB" in the last 72 hours. Anemia Panel: Recent Labs    05/29/23 0556  VITAMINB12 1,260*  FOLATE 9.1   Sepsis Labs: Recent Labs  Lab 05/25/23 2022 05/26/23 0932 05/27/23 0636  LATICACIDVEN 1.3 1.0 1.0    Recent Results (from the past 240 hour(s))  Urine Culture     Status: Abnormal   Collection Time: 05/25/23  4:20 PM   Specimen: Urine, Clean Catch  Result Value Ref Range Status   Specimen Description   Final    URINE, CLEAN CATCH Performed at [facility], [address],  [place], [place] [address]    Special Requests   Final    NONE Performed at [facility], [address] [place], [place] [address]    Culture (A)  Final    >=100,000 COLONIES/mL STAPHYLOCOCCUS AUREUS 30,000 COLONIES/mL KLEBSIELLA OXYTOCA Confirmed Extended Spectrum Beta-Lactamase Producer (ESBL).  In bloodstream infections from ESBL organisms, carbapenems are preferred over piperacillin/tazobactam. They are shown to have a lower risk of mortality.    Report Status 05/28/2023 FINAL  Final   Organism ID, Bacteria STAPHYLOCOCCUS AUREUS (A)  Final   Organism ID, Bacteria KLEBSIELLA OXYTOCA (A)  Final      Susceptibility   Klebsiella oxytoca - MIC*    AMPICILLIN >=32 RESISTANT Resistant     CEFEPIME >=32 RESISTANT Resistant     CEFTRIAXONE >=64 RESISTANT Resistant     CIPROFLOXACIN >=4 RESISTANT Resistant     GENTAMICIN >=16 RESISTANT Resistant     IMIPENEM <=0.25 SENSITIVE Sensitive     NITROFURANTOIN 64 INTERMEDIATE Intermediate     TRIMETH/SULFA >=320 RESISTANT Resistant  AMPICILLIN/SULBACTAM >=32 RESISTANT Resistant     PIP/TAZO 32 INTERMEDIATE Intermediate     * 30,000 COLONIES/mL KLEBSIELLA OXYTOCA   Staphylococcus aureus - MIC*    CIPROFLOXACIN <=0.5 SENSITIVE Sensitive     GENTAMICIN <=0.5 SENSITIVE Sensitive     NITROFURANTOIN <=16 SENSITIVE Sensitive     OXACILLIN <=0.25 SENSITIVE Sensitive     TETRACYCLINE <=1 SENSITIVE Sensitive     VANCOMYCIN <=0.5 SENSITIVE Sensitive     TRIMETH/SULFA <=10 SENSITIVE Sensitive     CLINDAMYCIN <=0.25 SENSITIVE Sensitive     RIFAMPIN <=0.5 SENSITIVE Sensitive     Inducible Clindamycin NEGATIVE Sensitive     LINEZOLID 2 SENSITIVE Sensitive     * >=100,000 COLONIES/mL STAPHYLOCOCCUS AUREUS  C Difficile Quick Screen w PCR reflex     Status: Abnormal   Collection Time: 05/25/23  8:22 PM   Specimen: STOOL  Result Value Ref Range Status   C Diff antigen POSITIVE (A) NEGATIVE Final   C Diff toxin POSITIVE (A) NEGATIVE Final   C  Diff interpretation Toxin producing C. difficile detected.  Final    Comment: CRITICAL RESULT CALLED TO, READ BACK BY AND VERIFIED WITH: Glo Herring RN ED @ 2106 05/25/23 lfd Performed at [facility], [address] [place], [place] [address]   Blood culture (routine x 2)     Status: None   Collection Time: 05/25/23  8:35 PM   Specimen: BLOOD  Result Value Ref Range Status   Specimen Description BLOOD BLOOD RIGHT FOREARM  Final   Special Requests   Final    BOTTLES DRAWN AEROBIC AND ANAEROBIC Blood Culture adequate volume   Culture   Final    NO GROWTH 5 DAYS Performed at [facility], [address], [place], [place] [address]    Report Status 05/30/2023 FINAL  Final  Blood culture (routine x 2)     Status: None   Collection Time: 05/25/23  8:35 PM   Specimen: BLOOD  Result Value Ref Range Status   Specimen Description BLOOD BLOOD RIGHT ARM  Final   Special Requests   Final    BOTTLES DRAWN AEROBIC AND ANAEROBIC Blood Culture adequate volume   Culture   Final    NO GROWTH 5 DAYS Performed at [facility], [address], [place], [place] [address]    Report Status 05/30/2023 FINAL  Final         Radiology Studies: No results found.      Scheduled Meds:  (feeding supplement) PROSource Plus  30 mL Oral BID BM   enoxaparin (LOVENOX) injection  40 mg Subcutaneous Q24H   famotidine  20 mg Oral Daily   fidaxomicin  200 mg Oral BID   gabapentin  100 mg Oral QHS   Gerhardt's butt cream   Topical BID   nutrition supplement (JUVEN)  1 packet Oral BID BM   sodium chloride flush  3 mL Intravenous Q12H   Continuous Infusions:     LOS: 4 days      Tresa Moore, MD [facility]   If 7PM-7AM, please contact night-coverage  05/30/2023, 1:06 PM

## 2023-05-30 NOTE — Plan of Care (Signed)
  Problem: Fluid Volume: Goal: Hemodynamic stability will improve Outcome: Progressing   Problem: Respiratory: Goal: Ability to maintain adequate ventilation will improve Outcome: Progressing   Problem: Clinical Measurements: Goal: Ability to maintain clinical measurements within normal limits will improve Outcome: Progressing   Problem: Pain Managment: Goal: General experience of comfort will improve Outcome: Progressing   Problem: Skin Integrity: Goal: Risk for impaired skin integrity will decrease Outcome: Progressing

## 2023-05-30 NOTE — Evaluation (Signed)
Physical Therapy Evaluation Patient Details Name: Jeanne [name] MRN: [id number] DOB: December 03, 1950 Today's Date: 05/30/2023  History of Present Illness  Pt is a 72 y.o. female with medical history significant for juvenile rheumatoid arthritis, hypertension, CKD, cervical cancer, C. Difficile  coming for diarrhea that started last night and x 10 episodes so came to ed. Pt also has nausea and vomiting.  Clinical Impression   Pt is seen by OT and PT for co-evaluation secondary to Pt's complexity. Pt limited with mobility and overall participation throughout session secondary to generalized pain from rheumatoid arthritis dx. Encouraged Pt to sit EOB but unsuccessful. Pt performs bed mobility max A with reports of increased difficulty rolling towards L side due to prior hip surgery. Overall, Pt is very limited in BLE ROM secondary to weakness, pain/discomfort, and prior B knee surgeries. Educated Pt on importance behind mobility to promote increased participation during session-Pt verbalized understanding but was insistent on EOB activities tomorrow. Pt would benefit from skilled PT to address above deficits and promote optimal return to PLOF.      If plan is discharge home, recommend the following: A lot of help with walking and/or transfers;A lot of help with bathing/dressing/bathroom;Assist for transportation;Help with stairs or ramp for entrance;Assistance with cooking/housework   Can travel by private vehicle        Equipment Recommendations None recommended by PT  Recommendations for Other Services       Functional Status Assessment Patient has had a recent decline in their functional status and demonstrates the ability to make significant improvements in function in a reasonable and predictable amount of time.     Precautions / Restrictions Precautions Precautions: Fall Restrictions Weight Bearing Restrictions: No      Mobility  Bed Mobility Overal bed mobility: Needs  Assistance Bed Mobility: Rolling Rolling: Max assist, Used rails         General bed mobility comments: Able to initiate bed mobility with reaching across midline but requires max A to facilitate mobility    Transfers Overall transfer level: Needs assistance                 General transfer comment: Unable to formally assess transfers secondary to Pt reporting generalized pain due to rheumatoid arthritis dx    Ambulation/Gait               General Gait Details: Unable to formally assess secondary to Pt reporting generalized pain due to rheumatoid arthritis dx; Pt reports short amb of approx 8 steps at baseline  Stairs            Wheelchair Mobility     Tilt Bed    Modified Rankin (Stroke Patients Only)       Balance Overall balance assessment: Needs assistance   Sitting balance-Leahy Scale: Zero Sitting balance - Comments: Unable to formally assess secondary to Pt reporting generalized pain due to rheumatoid arthritis dx     Standing balance-Leahy Scale: Zero Standing balance comment: Unable to formally assess secondary to Pt reporting generalized pain due to rheumatoid arthritis dx                             Pertinent Vitals/Pain Pain Assessment Pain Assessment: Faces Faces Pain Scale: Hurts whole lot Pain Location: generalized Pain Descriptors / Indicators: Aching, Discomfort, Dull Pain Intervention(s): Limited activity within patient's tolerance, Monitored during session    Home Living Family/patient expects to be discharged to:: Private residence  Living Arrangements: Spouse/significant other;Children Available Help at Discharge: Family;Available 24 hours/day Type of Home: House Home Access: Ramped entrance       Home Layout: One level Home Equipment: Agricultural consultant (2 wheels);Wheelchair - Dentist;Other (comment)      Prior Function Prior Level of Function : Needs assist        Physical Assist : Mobility (physical) Mobility (physical): Bed mobility;Transfers;Gait   Mobility Comments: has been working on trying to walk with HHPT prior to recent admissions, slide board and daughter or spouse to assist with bed transfers; had a lift but didn't use it; had been pivoting for transfers       Extremity/Trunk Assessment   Upper Extremity Assessment Upper Extremity Assessment: Defer to OT evaluation;Generalized weakness    Lower Extremity Assessment Lower Extremity Assessment: Defer to PT evaluation;RLE deficits/detail;LLE deficits/detail RLE Deficits / Details: limited ROM thoughout, most notably unable to get R ankle into neutral due to PF resistance; Pt reports decreased ROM secondary to prior surgeries RLE: Unable to fully assess due to pain RLE Sensation: WNL RLE Coordination: decreased gross motor LLE Deficits / Details: limited ROM thoughout, most notably unable to get L ankle into neutral due to PF resistance, slightly better than R ankle; Pt reports decreased ROM secondary to prior surgeries LLE: Unable to fully assess due to pain LLE Sensation: WNL LLE Coordination: decreased gross motor       Communication   Communication Communication: No apparent difficulties Cueing Techniques: Verbal cues  Cognition Arousal: Alert Behavior During Therapy: WFL for tasks assessed/performed Overall Cognitive Status: Within Functional Limits for tasks assessed                                 General Comments: AO x4; Pt endorses generalized pain at beginning of session reporting "I'm not getting up, I'm in too much pain"        General Comments      Exercises Other Exercises Other Exercises: BLE bed exercises: x5 heel slides, x5 hip ABD/ADD, x5 ankle pumps Other Exercises: toileting; max A for peri-care   Assessment/Plan    PT Assessment Patient needs continued PT services  PT Problem List Decreased strength;Decreased mobility;Decreased  range of motion;Decreased activity tolerance;Decreased balance;Decreased cognition;Pain       PT Treatment Interventions DME instruction;Gait training;Functional mobility training;Therapeutic activities;Therapeutic exercise;Balance training    PT Goals (Current goals can be found in the Care Plan section)  Acute Rehab PT Goals Patient Stated Goal: to go home PT Goal Formulation: With patient Time For Goal Achievement: 06/13/23 Potential to Achieve Goals: Fair    Frequency Min 1X/week     Co-evaluation               AM-PAC PT "6 Clicks" Mobility  Outcome Measure Help needed turning from your back to your side while in a flat bed without using bedrails?: A Lot Help needed moving from lying on your back to sitting on the side of a flat bed without using bedrails?: A Lot Help needed moving to and from a bed to a chair (including a wheelchair)?: A Lot Help needed standing up from a chair using your arms (e.g., wheelchair or bedside chair)?: A Lot Help needed to walk in hospital room?: A Lot Help needed climbing 3-5 steps with a railing? : Total 6 Click Score: 11    End of Session   Activity Tolerance: Patient limited by pain Patient  left: in bed;with call bell/phone within reach;with bed alarm set;with nursing/sitter in room Nurse Communication: Mobility status PT Visit Diagnosis: Unsteadiness on feet (R26.81);Muscle weakness (generalized) (M62.81);Pain Pain - Right/Left:  (generalized) Pain - part of body:  (generalized)    Time: 0981-1914 PT Time Calculation (min) (ACUTE ONLY): 19 min   Charges:                 Elmon Else, SPT   Kiki Bivens 05/30/2023, 11:01 AM

## 2023-05-30 NOTE — Evaluation (Signed)
Occupational Therapy Evaluation Patient Details Name: Jeanne [name] MRN: [id number] DOB: 11/02/1950 Today's Date: 05/30/2023   History of Present Illness Jeanne [name] is a 72 y.o. female with medical history significant of juvenile rheumatoid arthritis, hypertension, CKD, cervical cancer, C. Difficile who presented for nausea, vomiting, diarrhea. Pt with multiple hospitalizations in the last 6 months. Most recently she was  hospitalized from 02/27/2023 through 03/02/2023 for diarrhea with negative C. difficile test.  She presented to the hospital because of diarrhea that started the day prior to admission.  Also complained of pain in urination and increased frequency of micturition. She was found to have recurrent C. difficile diarrhea and acute UTI.   Clinical Impression   Jeanne [name] was seen for OT evaluation this date. Prior to hospital admission, pt was primarily bed-bound receiving assistance from her family members for LB ADL management. She endorses working with HHPT on her functional mobility, intermittently stand pivoting to her BSC or using a RW for mobilize ~ 12 feet . Pt lives with her spouse and adult daughter in a 1 level home with a ramped entrance. Pt presents to acute OT demonstrating impaired ADL performance and functional mobility 2/2 generalized weakness, decreased ROM in BLE, increased pain with mobility, and decreased activity tolerance (See OT problem list for additional functional deficits). Pt currently requires MAX-TOTAL A +2 for bed-level peri-hygiene and rolling R<>L.  Pt would benefit from skilled OT services to address noted impairments and functional limitations (see below for any additional details) in order to maximize safety and independence while minimizing falls risk and caregiver burden. Anticipate the need for follow up OT services in the home setting upon acute hospital DC.        If plan is discharge home, recommend the following: Two people to help with  bathing/dressing/bathroom;Help with stairs or ramp for entrance;Assist for transportation;Two people to help with walking and/or transfers    Functional Status Assessment  Patient has had a recent decline in their functional status and demonstrates the ability to make significant improvements in function in a reasonable and predictable amount of time.  Equipment Recommendations  None recommended by OT    Recommendations for Other Services       Precautions / Restrictions Precautions Precautions: Fall Restrictions Weight Bearing Restrictions: No      Mobility Bed Mobility Overal bed mobility: Needs Assistance Bed Mobility: Rolling Rolling: Max assist, Used rails         General bed mobility comments: Able to initiate bed mobility with reaching across midline but requires max A to facilitate mobility    Transfers Overall transfer level: Needs assistance                 General transfer comment: Pt declines all OOB/EOB activity      Balance Overall balance assessment: Needs assistance   Sitting balance-Leahy Scale: Zero                                     ADL either performed or assessed with clinical judgement   ADL Overall ADL's : Needs assistance/impaired                                       General ADL Comments: MAX-TOTAL A +2 to roll R<>L in bed for peri-care/linen change Requires MAX A to  maintain side lying position with TOTAL A for peri-hygiene. Pt declines all other offers for functional activity/mobility this date. Lists myriad of reasons why she is unable to get OOB and/or sit EOB. Anticipate at least MAX-TOTAL A for additional LB ADL management at bed level, SET UP assist for seated UB ADL management. Likely heavy +2 for functional mobility attempts.     Vision Baseline Vision/History: 1 Wears glasses Ability to See in Adequate Light: 1 Impaired Patient Visual Report: No change from baseline       Perception          Praxis         Pertinent Vitals/Pain Pain Assessment Pain Assessment: Faces Faces Pain Scale: Hurts whole lot Pain Location: generalized, L hip/LE Pain Descriptors / Indicators: Aching, Discomfort, Dull Pain Intervention(s): Limited activity within patient's tolerance, Monitored during session, Repositioned     Extremity/Trunk Assessment Upper Extremity Assessment Upper Extremity Assessment: Generalized weakness (No focal weakness/deficit appreciated. Noted arthritic changes t/o BUE pt reports this is baseline. Pt very self-limiting. Difficult to fully assess.)   Lower Extremity Assessment Lower Extremity Assessment: Defer to PT evaluation;Generalized weakness RLE Deficits / Details: limited ROM thoughout, most notably unable to get R ankle into neutral due to PF resistance; Pt reports decreased ROM secondary to prior surgeries RLE: Unable to fully assess due to pain RLE Sensation: WNL RLE Coordination: decreased gross motor LLE Deficits / Details: limited ROM thoughout, most notably unable to get L ankle into neutral due to PF resistance, slightly better than R ankle; Pt reports decreased ROM secondary to prior surgeries LLE: Unable to fully assess due to pain LLE Sensation: WNL LLE Coordination: decreased gross motor       Communication Communication Communication: No apparent difficulties Cueing Techniques: Verbal cues;Gestural cues   Cognition Arousal: Alert Behavior During Therapy: Restless, Anxious Overall Cognitive Status: Within Functional Limits for tasks assessed                                 General Comments: AO x4; Pt endorses generalized pain at beginning of session reporting "I'm not getting up, I'm in too much pain", very fearful of pain declines all OOB activity and requires encouragement for bed-level assessment this date.     General Comments       Exercises Other Exercises Other Exercises: Pt educated on importance of functional  activity/mobility during hospitalization, DC recs, and assisted with peri-care/bedding change with assist and edu as described above. See ADL section for additional details.   Shoulder Instructions      Home Living Family/patient expects to be discharged to:: Private residence Living Arrangements: Spouse/significant other;Children Available Help at Discharge: Family;Available 24 hours/day Type of Home: House Home Access: Ramped entrance     Home Layout: One level     Bathroom Shower/Tub: Walk-in shower;Tub/shower unit   Bathroom Toilet: Standard Bathroom Accessibility: Yes   Home Equipment: Agricultural consultant (2 wheels);Wheelchair - Dentist;Other (comment)          Prior Functioning/Environment Prior Level of Function : Needs assist       Physical Assist : Mobility (physical) Mobility (physical): Bed mobility;Transfers;Gait   Mobility Comments: has been working on trying to walk with HHPT prior to recent admissions, slide board and daughter or spouse to assist with bed transfers; had a lift but didn't use it; had been pivoting for transfers ADLs Comments: Sponge bathes at baseline uses pure wick, primarily  stays in bed when not working with PT at home, bed pan for BMs, family provides all meals, meds, daughter helps with LB dressing/bathing pt able to complete UB bathing and dressing seated EOB.        OT Problem List: Decreased strength;Decreased activity tolerance;Impaired balance (sitting and/or standing);Decreased coordination;Decreased safety awareness;Decreased knowledge of use of DME or AE      OT Treatment/Interventions: Self-care/ADL training;Therapeutic exercise;DME and/or AE instruction;Patient/family education;Energy conservation;Balance training;Therapeutic activities    OT Goals(Current goals can be found in the care plan section) Acute Rehab OT Goals Patient Stated Goal: To continue working with therapy at home. OT Goal  Formulation: With patient Time For Goal Achievement: 06/13/23 Potential to Achieve Goals: Good ADL Goals Pt Will Perform Grooming: sitting;with set-up;with supervision Pt Will Transfer to Toilet: bedside commode;stand pivot transfer;with min assist (c LRAD PRN) Pt Will Perform Toileting - Clothing Manipulation and hygiene: sitting/lateral leans;with min assist;with caregiver independent in assisting  OT Frequency: Min 1X/week    Co-evaluation PT/OT/SLP Co-Evaluation/Treatment: Yes Reason for Co-Treatment: Complexity of the patient's impairments (multi-system involvement);Necessary to address cognition/behavior during functional activity;For patient/therapist safety PT goals addressed during session: Mobility/safety with mobility;Balance;Strengthening/ROM OT goals addressed during session: ADL's and self-care;Proper use of Adaptive equipment and DME      AM-PAC OT "6 Clicks" Daily Activity     Outcome Measure Help from another person eating meals?: None Help from another person taking care of personal grooming?: None Help from another person toileting, which includes using toliet, bedpan, or urinal?: Total Help from another person bathing (including washing, rinsing, drying)?: Total Help from another person to put on and taking off regular upper body clothing?: A Little Help from another person to put on and taking off regular lower body clothing?: Total 6 Click Score: 14   End of Session Nurse Communication: Mobility status  Activity Tolerance: Patient limited by pain;Other (comment) (self limiting) Patient left: in bed;with call bell/phone within reach;with bed alarm set  OT Visit Diagnosis: Other abnormalities of gait and mobility (R26.89);Muscle weakness (generalized) (M62.81)                Time: 6144-3154 OT Time Calculation (min): 18 min Charges:  OT General Charges $OT Visit: 1 Visit OT Evaluation $OT Eval Moderate Complexity: 1 Mod  Jerni Selmer Smith Robert, M.S.,  OTR/L 05/30/23, 11:47 AM

## 2023-05-31 ENCOUNTER — Other Ambulatory Visit: Payer: Self-pay

## 2023-05-31 DIAGNOSIS — A0472 Enterocolitis due to Clostridium difficile, not specified as recurrent: Secondary | ICD-10-CM | POA: Diagnosis not present

## 2023-05-31 LAB — BASIC METABOLIC PANEL
Anion gap: 5 (ref 5–15)
BUN: 21 mg/dL (ref 8–23)
CO2: 26 mmol/L (ref 22–32)
Calcium: 7.9 mg/dL — ABNORMAL LOW (ref 8.9–10.3)
Chloride: 108 mmol/L (ref 98–111)
Creatinine, Ser: 0.95 mg/dL (ref 0.44–1.00)
GFR, Estimated: >60 mL/min (ref >60)
Glucose, Bld: 96 mg/dL (ref 70–99)
Potassium: 3.3 mmol/L — ABNORMAL LOW (ref 3.5–5.1)
Sodium: 139 mmol/L (ref 135–145)

## 2023-05-31 LAB — CBC WITH DIFFERENTIAL/PLATELET
Abs Immature Granulocytes: 0.12 10*3/uL — ABNORMAL HIGH (ref 0.00–0.07)
Basophils Absolute: 0 10*3/uL (ref 0.0–0.1)
Basophils Relative: 0 %
Eosinophils Absolute: 0.3 10*3/uL (ref 0.0–0.5)
Eosinophils Relative: 7 %
HCT: 31.3 % — ABNORMAL LOW (ref 36.0–46.0)
Hemoglobin: 9.9 g/dL — ABNORMAL LOW (ref 12.0–15.0)
Immature Granulocytes: 3 %
Lymphocytes Relative: 28 %
Lymphs Abs: 1.4 10*3/uL (ref 0.7–4.0)
MCH: 27.7 pg (ref 26.0–34.0)
MCHC: 31.6 g/dL (ref 30.0–36.0)
MCV: 87.7 fL (ref 80.0–100.0)
Monocytes Absolute: 0.5 10*3/uL (ref 0.1–1.0)
Monocytes Relative: 11 %
Neutro Abs: 2.5 10*3/uL (ref 1.7–7.7)
Neutrophils Relative %: 51 %
Platelets: 209 10*3/uL (ref 150–400)
RBC: 3.57 MIL/uL — ABNORMAL LOW (ref 3.87–5.11)
RDW: 14.2 % (ref 11.5–15.5)
WBC: 4.8 10*3/uL (ref 4.0–10.5)
nRBC: 0 % (ref 0.0–0.2)

## 2023-05-31 LAB — ZINC: Zinc: 41 ug/dL — ABNORMAL LOW (ref 44–115)

## 2023-05-31 LAB — COPPER, SERUM: Copper: 99 ug/dL (ref 80–158)

## 2023-05-31 MED ORDER — VITAMIN D (ERGOCALCIFEROL) 1.25 MG (50000 UNIT) PO CAPS: 50000.0000 [IU] | ORAL_CAPSULE | ORAL | Status: DC

## 2023-05-31 MED ORDER — ZINC SULFATE 220 (50 ZN) MG PO CAPS
220.0000 mg | ORAL_CAPSULE | Freq: Every day | ORAL | Status: DC
  Administered 2023-06-01 – 2023-06-02 (×2): 220 mg via ORAL
  Filled 2023-05-31 (×2): qty 1

## 2023-05-31 MED ORDER — VITAMIN A 3 MG (10000 UNIT) PO CAPS: 10000.0000 [IU] | ORAL_CAPSULE | Freq: Every day | ORAL | Status: DC

## 2023-05-31 MED ORDER — VITAMIN A 3 MG (10000 UNIT) PO CAPS
10000.0000 [IU] | ORAL_CAPSULE | Freq: Every day | ORAL | Status: DC
  Administered 2023-06-01: 10000 [IU] via ORAL
  Filled 2023-05-31 (×2): qty 1

## 2023-05-31 MED ORDER — VITAMIN D (ERGOCALCIFEROL) 1.25 MG (50000 UNIT) PO CAPS
50000.0000 [IU] | ORAL_CAPSULE | ORAL | Status: DC
  Administered 2023-06-01: 50000 [IU] via ORAL
  Filled 2023-05-31: qty 1

## 2023-05-31 MED ORDER — FIDAXOMICIN 200 MG PO TABS
200.0000 mg | ORAL_TABLET | Freq: Two times a day (BID) | ORAL | 0 refills | Status: AC
  Filled 2023-05-31: qty 14, 7d supply, fill #0

## 2023-05-31 NOTE — Plan of Care (Signed)
  Problem: Fluid Volume: Goal: Hemodynamic stability will improve Outcome: Progressing   Problem: Clinical Measurements: Goal: Diagnostic test results will improve Outcome: Progressing Goal: Signs and symptoms of infection will decrease Outcome: Progressing   Problem: Respiratory: Goal: Ability to maintain adequate ventilation will improve Outcome: Progressing   Problem: Education: Goal: Knowledge of General Education information will improve Description: Including pain rating scale, medication(s)/side effects and non-pharmacologic comfort measures Outcome: Progressing   Problem: Health Behavior/Discharge Planning: Goal: Ability to manage health-related needs will improve Outcome: Progressing   Problem: Activity: Goal: Risk for activity intolerance will decrease Outcome: Progressing   Problem: Clinical Measurements: Goal: Ability to maintain clinical measurements within normal limits will improve Outcome: Progressing Goal: Will remain free from infection Outcome: Progressing Goal: Diagnostic test results will improve Outcome: Progressing Goal: Respiratory complications will improve Outcome: Progressing Goal: Cardiovascular complication will be avoided Outcome: Progressing   Problem: Pain Managment: Goal: General experience of comfort will improve Outcome: Progressing   Problem: Safety: Goal: Ability to remain free from injury will improve Outcome: Progressing   Problem: Skin Integrity: Goal: Risk for impaired skin integrity will decrease Outcome: Progressing

## 2023-05-31 NOTE — Plan of Care (Signed)

## 2023-05-31 NOTE — Progress Notes (Signed)
Nutrition Follow Up Note   DOCUMENTATION CODES:   Not applicable  INTERVENTION:   Prostat liquid protein PO 30 ml BID with meals, each supplement provides 100 kcal, 15 grams protein.  Juven Fruit Punch po BID, each serving provides 95kcal and 2.5g of protein (amino acids glutamine and arginine)  MVI po daily   Ergocalciferol 50,000 units po weekly x 6 weeks   Zinc 220mg  (50mg  elemental) daily x 30 days   Vitamin A 10,000 units po daily x 30 days  Pt at high refeed risk; recommend monitor potassium, magnesium and phosphorus labs daily until stable  Vitamin A lab pending   NUTRITION DIAGNOSIS:   Increased nutrient needs related to acute illness as evidenced by estimated needs. -ongoing   GOAL:   Patient will meet greater than or equal to 90% of their needs -progressing   MONITOR:   PO intake, Supplement acceptance, Labs, Weight trends, I & O's, Skin  ASSESSMENT:   72 y/o female with h/o HTN, cervical cancer, RA, recurrent C. diff, CKD III, nephrolithiasis, left [facility] urolithiasis and recent admission for pancolitis and sepsis who is admitted with recurrent C. diff, UTI and sepsis.  Pt with improved appetite and oral intake. Pt eating 100% of meals in hospital. Pt is taking the Juven and the ProSource Plus. Pt remains at high refeed risk; will check electrolytes tomorrow morning. Vitamin labs returned low; will start supplementation. Per chart, pt appears weight stable in hospital.   Medications reviewed and include: lovenox, pepcid, juven, dificid  Labs reviewed: K 3.3(L) P 2.2(L), Mg 2.2 wnl- 9/18 Folate 9.1 wnl, copper 99 wnl, B12 1260(H), zinc 41(L), vitamin D 14.6(L)- 9/18 Hgb 9.9(L), Hct 31.3(L)  Diet Order:   Diet Order             Diet regular Fluid consistency: Thin  Diet effective now                  EDUCATION NEEDS:   Education needs have been addressed  Skin:  Skin Assessment: Reviewed RN Assessment (Stage I perinium)  Last BM:  9/19- type  7  Height:   Ht Readings from Last 1 Encounters:  05/27/23 5\' 4"  (1.626 m)    Weight:   Wt Readings from Last 1 Encounters:  05/31/23 66.8 kg    Ideal Body Weight:  54.5 kg  BMI:  Body mass index is 25.28 kg/m.  Estimated Nutritional Needs:   Kcal:  1500-1700kcal/day  Protein:  75-85g/day  Fluid:  1.4-1.6L/day  Betsey Holiday MS, RD, LDN Please refer to [facility] for RD and/or RD on-call/weekend/after hours pager

## 2023-05-31 NOTE — TOC Progression Note (Signed)
Transition of Care [facility]) - Progression Note    Patient Details  Name: Jeanne [name] MRN: [id number] Date of Birth: [birth year]  Transition of Care [facility] CM/SW Contact  Chapman Fitch, RN Phone Number: 05/31/2023, 3:51 PM  Clinical Narrative:    Discharge plan remains to discharge home with resumption of home health through [facility] when medically appropriate    Expected Discharge Plan: Home w Home Health Services Barriers to Discharge: Continued Medical Work up  Expected Discharge Plan and Services       Living arrangements for the past 2 months: Single Family Home                                       Social Determinants of Health (SDOH) Interventions SDOH Screenings   Food Insecurity: No Food Insecurity (05/27/2023)  Housing: Low Risk  (05/27/2023)  Transportation Needs: No Transportation Needs (05/27/2023)  Utilities: Not At Risk (05/27/2023)  Alcohol Screen: Low Risk  (08/10/2020)  Depression (PHQ2-9): Low Risk  (08/10/2020)  Financial Resource Strain: Low Risk  (04/24/2022)   Received from [facility], [facility]  Physical Activity: Inactive (08/10/2020)  Social Connections: Moderately Integrated (08/10/2020)  Stress: No Stress Concern Present (08/10/2020)  Tobacco Use: Medium Risk (05/27/2023)    Readmission Risk Interventions    03/01/2023   10:56 AM 01/02/2023   12:24 PM  Readmission Risk Prevention Plan  Transportation Screening Complete Complete  PCP or Specialist Appt within 3-5 Days Complete Complete  HRI or Home Care Consult Complete   Social Work Consult for Recovery Care Planning/Counseling Complete Complete  Palliative Care Screening Not Applicable Not Applicable  Medication Review Oceanographer) Complete Complete

## 2023-05-31 NOTE — Progress Notes (Signed)
PROGRESS NOTE    [name]  VHQ:[id number] DOB: Jul 21, 1951 DOA: 05/25/2023 PCP: Jerl Mina, MD    Brief Narrative:   Jeanne [name] is a 72 y.o. female with medical history significant for cervical cancer, rheumatoid arthritis, hypertension, nephrolithiasis, left UPJ urolithiasis, CKD stage IIIb, COVID-19 infection, C. difficile infection requiring hospitalization from 11/09/2022 to 11/12/2022, recurrent C. difficile infection requiring hospitalization from 12/27/2022 through 01/19/2023, Campylobacter colitis with negative C. difficile hospitalized from 02/10/2023 through 02/17/2023, hospitalized from 02/27/2023 through 03/02/2023 for diarrhea with negative C. difficile test.  She presented to the hospital because of diarrhea that started the day prior to admission.  Also complained of pain in urination and increased frequency of micturition.   She was found to have recurrent C. difficile diarrhea and acute UTI.   Assessment & Plan:   Principal Problem:   Clostridium difficile diarrhea Active Problems:   Hypotension   CKD stage 3b, GFR 30-44 ml/min (HCC)   UTI (urinary tract infection)   Severe sepsis with septic shock (HCC)  Sepsis secondary to recurrent C. difficile colitis: Diarrhea is improving.  Leukocytosis  has improved.   Plan: Continue Dificid course x 10 days following the completion of meropenem course. Last dose will be 9/28 Pharmacy was able to secure payment for Dificid Prescription sent to outpatient pharmacy Tentative plan for discharge from hospital 9/22 Outpatient referral to GI  Acute UTI with dysuria and increased frequency of micturition:   Urine culture showed Staphylococcus aureus and Klebsiella oxytoca Completed 5-day course of meropenem on 9/18 Monitor vitals and fever curve No further antibiotics     Hypotension: It appears patient has chronic/intermittent hypotension and she was taking midodrine at home.  Blood pressure has improved so we will  discontinue midodrine at this time.  BP stable     Hypomagnesemia and hypokalemia: Improved.       General weakness: PT evaluation.     Other comorbidities include CKD stage IIIb, chronic bilateral hydronephrosis, left UPJ urolithiasis, recurrent UTIs, history of cervical cancer, rheumatoid arthritis    DVT prophylaxis: SQ Lovenox Code Status: Full Family Communication: Husband via phone on 9/18 Disposition Plan: Status is: Inpatient Remains inpatient appropriate because: Refractory C. difficile colitis   Level of care: Med-Surg  Consultants:  None  Procedures:  None  Antimicrobials: Dificid   Subjective: Seen and examined.  Resting comfortably in bed.  No visible distress.  Objective: Vitals:   05/30/23 2015 05/31/23 0416 05/31/23 0500 05/31/23 0955  BP: 128/79 111/68  134/68  Pulse: 82 78  81  Resp: 18 18  17   Temp: 98.6 F (37 C) 98 F (36.7 C)  98.3 F (36.8 C)  TempSrc: Oral Oral  Oral  SpO2: 100% 98%  100%  Weight:   66.8 kg   Height:        Intake/Output Summary (Last 24 hours) at 05/31/2023 1130 Last data filed at 05/31/2023 1020 Gross per 24 hour  Intake 600 ml  Output 500 ml  Net 100 ml   Filed Weights   05/28/23 1635 05/30/23 0456 05/31/23 0500  Weight: 66.7 kg 66.7 kg 66.8 kg    Examination:  General exam: No acute distress Respiratory system: Clear to auscultation. Respiratory effort normal. Cardiovascular system: S1-S2, RRR, no murmurs, no pedal edema Gastrointestinal system: Soft, nontender, nondistended, hyperactive bowel sounds Central nervous system: Alert and oriented. No focal neurological deficits. Extremities: Symmetric 5 x 5 power. Skin: No rashes, lesions or ulcers Psychiatry: Judgement and insight appear normal. Mood &  affect appropriate.     Data Reviewed: I have personally reviewed following labs and imaging studies  CBC: Recent Labs  Lab 05/25/23 1211 05/26/23 0932 05/27/23 0636 05/30/23 0458 05/31/23 0808   WBC 18.7* 14.0* 7.0 5.1 4.8  NEUTROABS  --   --  6.1  --  2.5  HGB 12.6 10.1* 10.6* 10.3* 9.9*  HCT 39.5 32.7* 34.8* 32.9* 31.3*  MCV 88.4 90.3 91.3 88.7 87.7  PLT 306 243 214 232 209   Basic Metabolic Panel: Recent Labs  Lab 05/26/23 0932 05/27/23 0636 05/28/23 0724 05/29/23 0556 05/31/23 0808  NA 138 137 141 140 139  K 3.7 3.2* 3.7 3.7 3.3*  CL 110 110 114* 113* 108  CO2 18* 18* 20* 22 26  GLUCOSE 104* 75 100* 110* 96  BUN 16 14 12 10 21   CREATININE 1.06* 1.16* 1.11* 1.09* 0.95  CALCIUM 7.6* 7.9* 8.2* 8.1* 7.9*  MG 1.5* 2.4 2.4 2.2  --   PHOS 2.6 2.9  --  2.2*  --    GFR: Estimated Creatinine Clearance: 51.8 mL/min (by C-G formula based on SCr of 0.95 mg/dL). Liver Function Tests: Recent Labs  Lab 05/25/23 1610 05/26/23 0932  AST 15 15  ALT 11 12  ALKPHOS 91 84  BILITOT 0.7 1.0  PROT 4.9* 4.9*  ALBUMIN 2.0* 2.3*   Recent Labs  Lab 05/25/23 1610  LIPASE 15   No results for input(s): "AMMONIA" in the last 168 hours. Coagulation Profile: No results for input(s): "INR", "PROTIME" in the last 168 hours. Cardiac Enzymes: No results for input(s): "CKTOTAL", "CKMB", "CKMBINDEX", "TROPONINI" in the last 168 hours. BNP (last 3 results) No results for input(s): "PROBNP" in the last 8760 hours. HbA1C: No results for input(s): "HGBA1C" in the last 72 hours. CBG: No results for input(s): "GLUCAP" in the last 168 hours. Lipid Profile: No results for input(s): "CHOL", "HDL", "LDLCALC", "TRIG", "CHOLHDL", "LDLDIRECT" in the last 72 hours. Thyroid Function Tests: No results for input(s): "TSH", "T4TOTAL", "FREET4", "T3FREE", "THYROIDAB" in the last 72 hours. Anemia Panel: Recent Labs    05/29/23 0556  VITAMINB12 1,260*  FOLATE 9.1   Sepsis Labs: Recent Labs  Lab 05/25/23 2022 05/26/23 0932 05/27/23 0636  LATICACIDVEN 1.3 1.0 1.0    Recent Results (from the past 240 hour(s))  Urine Culture     Status: Abnormal   Collection Time: 05/25/23  4:20 PM    Specimen: Urine, Clean Catch  Result Value Ref Range Status   Specimen Description   Final    URINE, CLEAN CATCH Performed at [facility], [address], [place], [place] [address]    Special Requests   Final    NONE Performed at [facility], [address] [place], [place] [address]    Culture (A)  Final    >=100,000 COLONIES/mL STAPHYLOCOCCUS AUREUS 30,000 COLONIES/mL KLEBSIELLA OXYTOCA Confirmed Extended Spectrum Beta-Lactamase Producer (ESBL).  In bloodstream infections from ESBL organisms, carbapenems are preferred over piperacillin/tazobactam. They are shown to have a lower risk of mortality.    Report Status 05/28/2023 FINAL  Final   Organism ID, Bacteria STAPHYLOCOCCUS AUREUS (A)  Final   Organism ID, Bacteria KLEBSIELLA OXYTOCA (A)  Final      Susceptibility   Klebsiella oxytoca - MIC*    AMPICILLIN >=32 RESISTANT Resistant     CEFEPIME >=32 RESISTANT Resistant     CEFTRIAXONE >=64 RESISTANT Resistant     CIPROFLOXACIN >=4 RESISTANT Resistant     GENTAMICIN >=16 RESISTANT Resistant  IMIPENEM <=0.25 SENSITIVE Sensitive     NITROFURANTOIN 64 INTERMEDIATE Intermediate     TRIMETH/SULFA >=320 RESISTANT Resistant     AMPICILLIN/SULBACTAM >=32 RESISTANT Resistant     PIP/TAZO 32 INTERMEDIATE Intermediate     * 30,000 COLONIES/mL KLEBSIELLA OXYTOCA   Staphylococcus aureus - MIC*    CIPROFLOXACIN <=0.5 SENSITIVE Sensitive     GENTAMICIN <=0.5 SENSITIVE Sensitive     NITROFURANTOIN <=16 SENSITIVE Sensitive     OXACILLIN <=0.25 SENSITIVE Sensitive     TETRACYCLINE <=1 SENSITIVE Sensitive     VANCOMYCIN <=0.5 SENSITIVE Sensitive     TRIMETH/SULFA <=10 SENSITIVE Sensitive     CLINDAMYCIN <=0.25 SENSITIVE Sensitive     RIFAMPIN <=0.5 SENSITIVE Sensitive     Inducible Clindamycin NEGATIVE Sensitive     LINEZOLID 2 SENSITIVE Sensitive     * >=100,000 COLONIES/mL STAPHYLOCOCCUS AUREUS  C Difficile Quick Screen w PCR reflex     Status: Abnormal    Collection Time: 05/25/23  8:22 PM   Specimen: STOOL  Result Value Ref Range Status   C Diff antigen POSITIVE (A) NEGATIVE Final   C Diff toxin POSITIVE (A) NEGATIVE Final   C Diff interpretation Toxin producing C. difficile detected.  Final    Comment: CRITICAL RESULT CALLED TO, READ BACK BY AND VERIFIED WITH: Glo Herring RN ED @ 2106 05/25/23 lfd Performed at [facility], [address] [place], [place] [address]   Blood culture (routine x 2)     Status: None   Collection Time: 05/25/23  8:35 PM   Specimen: BLOOD  Result Value Ref Range Status   Specimen Description BLOOD BLOOD RIGHT FOREARM  Final   Special Requests   Final    BOTTLES DRAWN AEROBIC AND ANAEROBIC Blood Culture adequate volume   Culture   Final    NO GROWTH 5 DAYS Performed at [facility], [address], [place], [place] [address]    Report Status 05/30/2023 FINAL  Final  Blood culture (routine x 2)     Status: None   Collection Time: 05/25/23  8:35 PM   Specimen: BLOOD  Result Value Ref Range Status   Specimen Description BLOOD BLOOD RIGHT ARM  Final   Special Requests   Final    BOTTLES DRAWN AEROBIC AND ANAEROBIC Blood Culture adequate volume   Culture   Final    NO GROWTH 5 DAYS Performed at [facility], [address] [place], [place] [address]    Report Status 05/30/2023 FINAL  Final         Radiology Studies: No results found.      Scheduled Meds:  (feeding supplement) PROSource Plus  30 mL Oral BID BM   enoxaparin (LOVENOX) injection  40 mg Subcutaneous Q24H   famotidine  20 mg Oral Daily   fidaxomicin  200 mg Oral BID   gabapentin  100 mg Oral QHS   Gerhardt's butt cream   Topical BID   nutrition supplement (JUVEN)  1 packet Oral BID BM   sodium chloride flush  3 mL Intravenous Q12H   Continuous Infusions:     LOS: 5 days      Tresa Moore, MD [facility]   If 7PM-7AM, please contact night-coverage  05/31/2023, 11:30 AM

## 2023-05-31 NOTE — Progress Notes (Signed)
PT Cancellation Note  Patient Details Name: Jeanne [name] MRN: [id number] DOB: [birth year]   Cancelled Treatment:     Pt declined PT session at this time secondary to generalized pain from RA dx. Encouraged Pt for PT participation but she endorses 8/10 NPS and not wanting to exacerbate pain. Pt requests to cont PT with [facility] upon discharge. Will re-attempt visit at a later date.   Keyleen Cerrato 05/31/2023, 1:23 PM

## 2023-06-01 DIAGNOSIS — A0472 Enterocolitis due to Clostridium difficile, not specified as recurrent: Secondary | ICD-10-CM | POA: Diagnosis not present

## 2023-06-01 LAB — BASIC METABOLIC PANEL
Anion gap: 5 (ref 5–15)
BUN: 27 mg/dL — ABNORMAL HIGH (ref 8–23)
CO2: 24 mmol/L (ref 22–32)
Calcium: 8 mg/dL — ABNORMAL LOW (ref 8.9–10.3)
Chloride: 110 mmol/L (ref 98–111)
Creatinine, Ser: 0.86 mg/dL (ref 0.44–1.00)
GFR, Estimated: >60 mL/min (ref >60)
Glucose, Bld: 100 mg/dL — ABNORMAL HIGH (ref 70–99)
Potassium: 3.5 mmol/L (ref 3.5–5.1)
Sodium: 139 mmol/L (ref 135–145)

## 2023-06-01 LAB — PHOSPHORUS: Phosphorus: 2.4 mg/dL — ABNORMAL LOW (ref 2.5–4.6)

## 2023-06-01 LAB — MAGNESIUM: Magnesium: 2 mg/dL (ref 1.7–2.4)

## 2023-06-01 NOTE — Plan of Care (Signed)

## 2023-06-01 NOTE — Progress Notes (Signed)
PROGRESS NOTE    Jeanne [name]  UJW:[id number] DOB: [birth year] DOA: 05/25/2023 PCP: Jerl Mina, MD    Brief Narrative:   Jeanne [name] is a 72 y.o. female with medical history significant for cervical cancer, rheumatoid arthritis, hypertension, nephrolithiasis, left UPJ urolithiasis, CKD stage IIIb, COVID-19 infection, C. difficile infection requiring hospitalization from 11/09/2022 to 11/12/2022, recurrent C. difficile infection requiring hospitalization from 12/27/2022 through 01/19/2023, Campylobacter colitis with negative C. difficile hospitalized from 02/10/2023 through 02/17/2023, hospitalized from 02/27/2023 through 03/02/2023 for diarrhea with negative C. difficile test.  She presented to the hospital because of diarrhea that started the day prior to admission.  Also complained of pain in urination and increased frequency of micturition.   She was found to have recurrent C. difficile diarrhea and acute UTI.   Assessment & Plan:   Principal Problem:   Clostridium difficile diarrhea Active Problems:   Hypotension   CKD stage 3b, GFR 30-44 ml/min (HCC)   UTI (urinary tract infection)   Severe sepsis with septic shock (HCC)  Sepsis secondary to recurrent C. difficile colitis: Diarrhea is improving.  Leukocytosis  has improved.   Plan: Continue Dificid x 10-day course following completion of meropenem Last dose 9/28 Pharmacy was able to secure payment for Dificid Prescription sent to outpatient pharmacy Will be delivered to patient on 9/22 prior to discharge Tentative plan for discharge from hospital 9/22 Outpatient referral to GI  Acute UTI with dysuria and increased frequency of micturition:   Urine culture showed Staphylococcus aureus and Klebsiella oxytoca Completed 5-day course of meropenem on 9/18 Monitor vitals and fever curve No further antibiotics     Hypotension: It appears patient has chronic/intermittent hypotension and she was taking midodrine at home.  Blood  pressure has improved so we will discontinue midodrine at this time.  BP stable     Hypomagnesemia and hypokalemia: Improved.       General weakness: PT evaluation.     Other comorbidities include CKD stage IIIb, chronic bilateral hydronephrosis, left UPJ urolithiasis, recurrent UTIs, history of cervical cancer, rheumatoid arthritis    DVT prophylaxis: SQ Lovenox Code Status: Full Family Communication: Husband via phone on 9/18 Disposition Plan: Status is: Inpatient Remains inpatient appropriate because: Refractory C. difficile colitis   Level of care: Med-Surg  Consultants:  None  Procedures:  None  Antimicrobials: Dificid   Subjective: Seen and examined.  Resting comfortably in bed.  Reports improvement in diarrhea  Objective: Vitals:   05/31/23 2043 06/01/23 0251 06/01/23 0428 06/01/23 0941  BP: (!) 121/99  100/60 129/71  Pulse: 76  72 82  Resp: 17  17 16   Temp: 98.3 F (36.8 C)  98.3 F (36.8 C) (!) 97.5 F (36.4 C)  TempSrc:    Oral  SpO2: 100%  99% 100%  Weight:  62.3 kg    Height:        Intake/Output Summary (Last 24 hours) at 06/01/2023 1149 Last data filed at 06/01/2023 1014 Gross per 24 hour  Intake 240 ml  Output --  Net 240 ml   Filed Weights   05/30/23 0456 05/31/23 0500 06/01/23 0251  Weight: 66.7 kg 66.8 kg 62.3 kg    Examination:  General exam: NAD Respiratory system: Clear to auscultation. Respiratory effort normal. Cardiovascular system: S1-S2, RRR, no murmurs, no pedal edema Gastrointestinal system: Soft, NT/ND, normal bowel sounds Central nervous system: Alert and oriented. No focal neurological deficits. Extremities: Symmetric 5 x 5 power. Skin: No rashes, lesions or ulcers Psychiatry: Judgement and  insight appear normal. Mood & affect appropriate.     Data Reviewed: I have personally reviewed following labs and imaging studies  CBC: Recent Labs  Lab 05/25/23 1211 05/26/23 0932 05/27/23 0636 05/30/23 0458  05/31/23 0808  WBC 18.7* 14.0* 7.0 5.1 4.8  NEUTROABS  --   --  6.1  --  2.5  HGB 12.6 10.1* 10.6* 10.3* 9.9*  HCT 39.5 32.7* 34.8* 32.9* 31.3*  MCV 88.4 90.3 91.3 88.7 87.7  PLT 306 243 214 232 209   Basic Metabolic Panel: Recent Labs  Lab 05/26/23 0932 05/27/23 0636 05/28/23 0724 05/29/23 0556 05/31/23 0808 06/01/23 0340  NA 138 137 141 140 139 139  K 3.7 3.2* 3.7 3.7 3.3* 3.5  CL 110 110 114* 113* 108 110  CO2 18* 18* 20* 22 26 24   GLUCOSE 104* 75 100* 110* 96 100*  BUN 16 14 12 10 21  27*  CREATININE 1.06* 1.16* 1.11* 1.09* 0.95 0.86  CALCIUM 7.6* 7.9* 8.2* 8.1* 7.9* 8.0*  MG 1.5* 2.4 2.4 2.2  --  2.0  PHOS 2.6 2.9  --  2.2*  --  2.4*   GFR: Estimated Creatinine Clearance: 52.6 mL/min (by C-G formula based on SCr of 0.86 mg/dL). Liver Function Tests: Recent Labs  Lab 05/25/23 1610 05/26/23 0932  AST 15 15  ALT 11 12  ALKPHOS 91 84  BILITOT 0.7 1.0  PROT 4.9* 4.9*  ALBUMIN 2.0* 2.3*   Recent Labs  Lab 05/25/23 1610  LIPASE 15   No results for input(s): "AMMONIA" in the last 168 hours. Coagulation Profile: No results for input(s): "INR", "PROTIME" in the last 168 hours. Cardiac Enzymes: No results for input(s): "CKTOTAL", "CKMB", "CKMBINDEX", "TROPONINI" in the last 168 hours. BNP (last 3 results) No results for input(s): "PROBNP" in the last 8760 hours. HbA1C: No results for input(s): "HGBA1C" in the last 72 hours. CBG: No results for input(s): "GLUCAP" in the last 168 hours. Lipid Profile: No results for input(s): "CHOL", "HDL", "LDLCALC", "TRIG", "CHOLHDL", "LDLDIRECT" in the last 72 hours. Thyroid Function Tests: No results for input(s): "TSH", "T4TOTAL", "FREET4", "T3FREE", "THYROIDAB" in the last 72 hours. Anemia Panel: No results for input(s): "VITAMINB12", "FOLATE", "FERRITIN", "TIBC", "IRON", "RETICCTPCT" in the last 72 hours.  Sepsis Labs: Recent Labs  Lab 05/25/23 2022 05/26/23 0932 05/27/23 0636  LATICACIDVEN 1.3 1.0 1.0     Recent Results (from the past 240 hour(s))  Urine Culture     Status: Abnormal   Collection Time: 05/25/23  4:20 PM   Specimen: Urine, Clean Catch  Result Value Ref Range Status   Specimen Description   Final    URINE, CLEAN CATCH Performed at [facility], [address] [place], [place] [address]    Special Requests   Final    NONE Performed at [facility], [address] [place], [place] [address]    Culture (A)  Final    >=100,000 COLONIES/mL STAPHYLOCOCCUS AUREUS 30,000 COLONIES/mL KLEBSIELLA OXYTOCA Confirmed Extended Spectrum Beta-Lactamase Producer (ESBL).  In bloodstream infections from ESBL organisms, carbapenems are preferred over piperacillin/tazobactam. They are shown to have a lower risk of mortality.    Report Status 05/28/2023 FINAL  Final   Organism ID, Bacteria STAPHYLOCOCCUS AUREUS (A)  Final   Organism ID, Bacteria KLEBSIELLA OXYTOCA (A)  Final      Susceptibility   Klebsiella oxytoca - MIC*    AMPICILLIN >=32 RESISTANT Resistant     CEFEPIME >=32 RESISTANT Resistant     CEFTRIAXONE >=64 RESISTANT  Resistant     CIPROFLOXACIN >=4 RESISTANT Resistant     GENTAMICIN >=16 RESISTANT Resistant     IMIPENEM <=0.25 SENSITIVE Sensitive     NITROFURANTOIN 64 INTERMEDIATE Intermediate     TRIMETH/SULFA >=320 RESISTANT Resistant     AMPICILLIN/SULBACTAM >=32 RESISTANT Resistant     PIP/TAZO 32 INTERMEDIATE Intermediate     * 30,000 COLONIES/mL KLEBSIELLA OXYTOCA   Staphylococcus aureus - MIC*    CIPROFLOXACIN <=0.5 SENSITIVE Sensitive     GENTAMICIN <=0.5 SENSITIVE Sensitive     NITROFURANTOIN <=16 SENSITIVE Sensitive     OXACILLIN <=0.25 SENSITIVE Sensitive     TETRACYCLINE <=1 SENSITIVE Sensitive     VANCOMYCIN <=0.5 SENSITIVE Sensitive     TRIMETH/SULFA <=10 SENSITIVE Sensitive     CLINDAMYCIN <=0.25 SENSITIVE Sensitive     RIFAMPIN <=0.5 SENSITIVE Sensitive     Inducible Clindamycin NEGATIVE Sensitive     LINEZOLID 2 SENSITIVE  Sensitive     * >=100,000 COLONIES/mL STAPHYLOCOCCUS AUREUS  C Difficile Quick Screen w PCR reflex     Status: Abnormal   Collection Time: 05/25/23  8:22 PM   Specimen: STOOL  Result Value Ref Range Status   C Diff antigen POSITIVE (A) NEGATIVE Final   C Diff toxin POSITIVE (A) NEGATIVE Final   C Diff interpretation Toxin producing C. difficile detected.  Final    Comment: CRITICAL RESULT CALLED TO, READ BACK BY AND VERIFIED WITH: Glo Herring RN ED @ 2106 05/25/23 lfd Performed at [facility], [address] [place], [place] [address]   Blood culture (routine x 2)     Status: None   Collection Time: 05/25/23  8:35 PM   Specimen: BLOOD  Result Value Ref Range Status   Specimen Description BLOOD BLOOD RIGHT FOREARM  Final   Special Requests   Final    BOTTLES DRAWN AEROBIC AND ANAEROBIC Blood Culture adequate volume   Culture   Final    NO GROWTH 5 DAYS Performed at [facility], [address] [place], [place] [address]    Report Status 05/30/2023 FINAL  Final  Blood culture (routine x 2)     Status: None   Collection Time: 05/25/23  8:35 PM   Specimen: BLOOD  Result Value Ref Range Status   Specimen Description BLOOD BLOOD RIGHT ARM  Final   Special Requests   Final    BOTTLES DRAWN AEROBIC AND ANAEROBIC Blood Culture adequate volume   Culture   Final    NO GROWTH 5 DAYS Performed at [facility], [address] [place], [place] [address]    Report Status 05/30/2023 FINAL  Final         Radiology Studies: No results found.      Scheduled Meds:  (feeding supplement) PROSource Plus  30 mL Oral BID BM   enoxaparin (LOVENOX) injection  40 mg Subcutaneous Q24H   famotidine  20 mg Oral Daily   fidaxomicin  200 mg Oral BID   gabapentin  100 mg Oral QHS   Gerhardt's butt cream   Topical BID   nutrition supplement (JUVEN)  1 packet Oral BID BM   sodium chloride flush  3 mL Intravenous Q12H   vitamin A  10,000 Units Oral Daily    Vitamin D (Ergocalciferol)  50,000 Units Oral Q7 days   zinc sulfate  220 mg Oral Daily   Continuous Infusions:     LOS: 6 days      Tresa Moore, MD [facility]  If 7PM-7AM, please contact night-coverage  06/01/2023, 11:49 AM

## 2023-06-02 ENCOUNTER — Other Ambulatory Visit: Payer: Self-pay

## 2023-06-02 DIAGNOSIS — A0472 Enterocolitis due to Clostridium difficile, not specified as recurrent: Secondary | ICD-10-CM | POA: Diagnosis not present

## 2023-06-02 MED ORDER — ZINC SULFATE 220 (50 ZN) MG PO CAPS
220.0000 mg | ORAL_CAPSULE | Freq: Every day | ORAL | 0 refills | Status: DC
  Filled 2023-06-02: qty 100, 100d supply, fill #0

## 2023-06-02 MED ORDER — VITAMIN D (ERGOCALCIFEROL) 1.25 MG (50000 UNIT) PO CAPS
50000.0000 [IU] | ORAL_CAPSULE | ORAL | 0 refills | Status: DC
  Filled 2023-06-02: qty 4, 28d supply, fill #0

## 2023-06-02 MED ORDER — VITAMIN A 3 MG (10000 UNIT) PO CAPS
10000.0000 [IU] | ORAL_CAPSULE | Freq: Every day | ORAL | 0 refills | Status: DC
  Filled 2023-06-02: qty 30, 30d supply, fill #0

## 2023-06-02 NOTE — Progress Notes (Signed)
PT Cancellation Note  Patient Details Name: MAYRAALEJANDRA PLAZOLA MRN: [id number] DOB: 1951-01-01   Cancelled Treatment:    Reason Eval/Treat Not Completed: Other (comment). Pt premedicated prior to arrival. Pleasant, however refuses all mobility at this time. Reports she plans to dc home today and would rather wait for HHPT. Informed RN.   Bricyn Labrada 06/02/2023, 10:02 AM Elizabeth Palau, PT, DPT, GCS [phone]

## 2023-06-02 NOTE — Discharge Summary (Signed)
Physician Discharge Summary  Jeanne [name] ZOX:[id number] DOB: [birth year] DOA: 05/25/2023  PCP: Jerl Mina, MD  Admit date: 05/25/2023 Discharge date: 06/02/2023  Admitted From: Home Disposition:  Home w home health  Recommendations for Outpatient Follow-up:  Follow up with PCP in 1-2 weeks Outpatient referral to GI  Home Health: Yes PT OT Equipment/Devices: None  Discharge Condition: Stable CODE STATUS: Full Diet recommendation: Regular  Brief/Interim Summary:  Jeanne [name] is a 72 y.o. female with medical history significant for cervical cancer, rheumatoid arthritis, hypertension, nephrolithiasis, left UPJ urolithiasis, CKD stage IIIb, COVID-19 infection, C. difficile infection requiring hospitalization from 11/09/2022 to 11/12/2022, recurrent C. difficile infection requiring hospitalization from 12/27/2022 through 01/19/2023, Campylobacter colitis with negative C. difficile hospitalized from 02/10/2023 through 02/17/2023, hospitalized from 02/27/2023 through 03/02/2023 for diarrhea with negative C. difficile test.  She presented to the hospital because of diarrhea that started the day prior to admission.  Also complained of pain in urination and increased frequency of micturition.   She was found to have recurrent C. difficile diarrhea and acute UTI.    Discharge Diagnoses:  Principal Problem:   Clostridium difficile diarrhea Active Problems:   Hypotension   CKD stage 3b, GFR 30-44 ml/min (HCC)   UTI (urinary tract infection)   Severe sepsis with septic shock (HCC)  Sepsis secondary to recurrent C. difficile colitis: Diarrhea is improving.  Leukocytosis  has improved.   Plan: Discharge home Continue Dificid x 10-day course following completion of meropenem Last dose 9/28.  Pharmacy will deliver medications to patient prior to DC.  Case discussed with Marlene Bast PA.  Patient will be referred to Dr. Horace Porteous office as outpatient  Acute UTI with dysuria and increased frequency of  micturition:   Urine culture showed Staphylococcus aureus and Klebsiella oxytoca Completed 5-day course of meropenem on 9/18 Number antibiotics on discharge    Discharge Instructions  Discharge Instructions     Diet - low sodium heart healthy   Complete by: As directed    Increase activity slowly   Complete by: As directed    No wound care   Complete by: As directed       Allergies as of 06/02/2023       Reactions   Codeine Nausea Only, Nausea And Vomiting   Augmentin [amoxicillin-pot Clavulanate] Nausea Only   Very nauseated/ feels sore on body    Ceftriaxone Other (See Comments)   "Pins and needles" sensation in legs        Medication List     STOP taking these medications    lidocaine-prilocaine cream Commonly known as: EMLA   promethazine 25 MG tablet Commonly known as: PHENERGAN   tretinoin 0.025 % cream Commonly known as: RETIN-A       TAKE these medications    Dificid 200 MG Tabs tablet Generic drug: fidaxomicin Take 1 tablet (200 mg total) by mouth 2 (two) times daily for 7 days.   gabapentin 100 MG capsule Commonly known as: NEURONTIN Take 100 mg by mouth at bedtime.   loperamide 2 MG capsule Commonly known as: IMODIUM Take 1 capsule (2 mg total) by mouth every 4 (four) hours as needed for diarrhea or loose stools.   midodrine 5 MG tablet Commonly known as: PROAMATINE Take 5 mg by mouth 3 (three) times daily with meals.   oxyCODONE 5 MG immediate release tablet Commonly known as: Oxy IR/ROXICODONE Take 1 tablet (5 mg total) by mouth every 6 (six) hours as needed.   vitamin A 3  MG (10000 UNITS) capsule Take 1 capsule (10,000 Units total) by mouth daily.   Vitamin D (Ergocalciferol) 1.25 MG (50000 UNIT) Caps capsule Commonly known as: DRISDOL Take 1 capsule (50,000 Units total) by mouth every 7 (seven) days. Start taking on: June 08, 2023   zinc sulfate 220 (50 Zn) MG capsule Take 1 capsule (220 mg total) by mouth daily. Start  taking on: June 03, 2023        Follow-up Information     [place], Boykin Nearing, MD Follow up.   Specialty: Gastroenterology Why: Call office Monday 9/23 to confirm follow up appointment details Contact information: [address] [place] [address] [phone]         Jerl Mina, MD. Schedule an appointment as soon as possible for a visit in 1 week(s).   Specialty: Family Medicine Contact information: [address] [facility] [place] [address] [phone]                Allergies  Allergen Reactions   Codeine Nausea Only and Nausea And Vomiting   Augmentin [Amoxicillin-Pot Clavulanate] Nausea Only    Very nauseated/ feels sore on body    Ceftriaxone Other (See Comments)    "Pins and needles" sensation in legs    Consultations: None   Procedures/Studies: CT ABDOMEN PELVIS W CONTRAST  Result Date: 05/25/2023 CLINICAL DATA:  Pt arrives via PTAR from home for diarrhea that started yesterday. Pt states the diarrhea and abd pain are consistent with her previous diagnosis for C. Diff. Pt A+Ox4. Pt reports that she did not complete vancomycin from previous C. Diff infection. EXAM: CT ABDOMEN AND PELVIS WITH CONTRAST TECHNIQUE: Multidetector CT imaging of the abdomen and pelvis was performed using the standard protocol following bolus administration of intravenous contrast. RADIATION DOSE REDUCTION: This exam was performed according to the departmental dose-optimization program which includes automated exposure control, adjustment of the mA and/or kV according to patient size and/or use of iterative reconstruction technique. CONTRAST:  75mL OMNIPAQUE IOHEXOL 300 MG/ML  SOLN COMPARISON:  02/27/2023 FINDINGS: Lower chest: No acute abnormality. Hepatobiliary: Normal liver. Gallbladder distended with some increased attenuation material dependently suggesting small stones or sludge. No wall thickening or pericholecystic fluid. No duct dilation.  Pancreas: Partial fatty replacement. No pancreatic mass or inflammation. Spleen: Top-normal in size.  No mass. Adrenals/Urinary Tract: No adrenal masses. Chronic bilateral hydronephrosis, greater on the left. Marked left renal atrophy. Moderate right renal cortical thinning. Right intrarenal stones. Mild dilation of the proximal to mid left ureter. Increased enhancement of the left intrarenal and ureteral urothelium. Right ureter normal in course and in caliber. Bladder minimally distended, grossly unremarkable but well visualized due to artifact from bilateral hip prostheses. Kidney findings are stable from the prior CT. Stomach/Bowel: 6 new stomach mostly decompressed, otherwise unremarkable. Small bowel is normal in caliber. No wall thickening. No inflammation. Possible wall thickening of the lower sigmoid colon and rectum, which are nondistended. Portions of the sigmoid colon are not well visualized due to artifact from prostheses. Remainder of the colon is normal in caliber. Mild wall thickening. Vascular/Lymphatic: Minor aortic atherosclerosis. No aneurysm. No enlarged lymph nodes. Reproductive: Status post hysterectomy. No pelvic mass. Pelvic sidewall vascular clips. Vascular clips extend into the lower retroperitoneum. Other: No ascites. Musculoskeletal: Chronic vertebral fractures, T12, L1 and L5, unchanged. No acute fracture. No bone lesion. Bilateral total hip arthroplasties. Lucency adjacent to superior acetabular component the right, unchanged from the prior CT. There is fluid attenuation the soft  tissue extending lateral from the right hip that has decreased in size compared to the prior exam. IMPRESSION: 1. Mild colonic wall thickening consistent with the given history of C. difficile infection. No evidence of an abscess. No bowel obstruction. 2. No other acute abnormality within the abdomen or pelvis. 3. Multiple chronic abnormalities detailed above similar to the prior CT. Electronically Signed    By: Amie Portland M.D.   On: 05/25/2023 17:44      Subjective: Seen and examined on the day of discharge.  Stable no distress.  Stools becoming formed.  Stable for discharge home. Discharge Exam: Vitals:   06/02/23 0433 06/02/23 0721  BP: 115/62 116/74  Pulse: 77 82  Resp: 16 18  Temp: 98 F (36.7 C) 98.2 F (36.8 C)  SpO2: 100% 100%   Vitals:   06/01/23 1605 06/01/23 2001 06/02/23 0433 06/02/23 0721  BP: 125/74 113/71 115/62 116/74  Pulse: 85 90 77 82  Resp: 16 17 16 18   Temp:  97.8 F (36.6 C) 98 F (36.7 C) 98.2 F (36.8 C)  TempSrc:   Oral   SpO2: 99% 98% 100% 100%  Weight:      Height:        General: Pt is alert, awake, not in acute distress Cardiovascular: RRR, S1/S2 +, no rubs, no gallops Respiratory: CTA bilaterally, no wheezing, no rhonchi Abdominal: Soft, NT, ND, bowel sounds + Extremities: no edema, no cyanosis    The results of significant diagnostics from this hospitalization (including imaging, microbiology, ancillary and laboratory) are listed below for reference.     Microbiology: Recent Results (from the past 240 hour(s))  Urine Culture     Status: Abnormal   Collection Time: 05/25/23  4:20 PM   Specimen: Urine, Clean Catch  Result Value Ref Range Status   Specimen Description   Final    URINE, CLEAN CATCH Performed at [facility], [address], [place], [place] [address]    Special Requests   Final    NONE Performed at [facility], [address] [place], [place] [address]    Culture (A)  Final    >=100,000 COLONIES/mL STAPHYLOCOCCUS AUREUS 30,000 COLONIES/mL KLEBSIELLA OXYTOCA Confirmed Extended Spectrum Beta-Lactamase Producer (ESBL).  In bloodstream infections from ESBL organisms, carbapenems are preferred over piperacillin/tazobactam. They are shown to have a lower risk of mortality.    Report Status 05/28/2023 FINAL  Final   Organism ID, Bacteria STAPHYLOCOCCUS AUREUS (A)  Final   Organism ID, Bacteria  KLEBSIELLA OXYTOCA (A)  Final      Susceptibility   Klebsiella oxytoca - MIC*    AMPICILLIN >=32 RESISTANT Resistant     CEFEPIME >=32 RESISTANT Resistant     CEFTRIAXONE >=64 RESISTANT Resistant     CIPROFLOXACIN >=4 RESISTANT Resistant     GENTAMICIN >=16 RESISTANT Resistant     IMIPENEM <=0.25 SENSITIVE Sensitive     NITROFURANTOIN 64 INTERMEDIATE Intermediate     TRIMETH/SULFA >=320 RESISTANT Resistant     AMPICILLIN/SULBACTAM >=32 RESISTANT Resistant     PIP/TAZO 32 INTERMEDIATE Intermediate     * 30,000 COLONIES/mL KLEBSIELLA OXYTOCA   Staphylococcus aureus - MIC*    CIPROFLOXACIN <=0.5 SENSITIVE Sensitive     GENTAMICIN <=0.5 SENSITIVE Sensitive     NITROFURANTOIN <=16 SENSITIVE Sensitive     OXACILLIN <=0.25 SENSITIVE Sensitive     TETRACYCLINE <=1 SENSITIVE Sensitive     VANCOMYCIN <=0.5 SENSITIVE Sensitive     TRIMETH/SULFA <=10 SENSITIVE Sensitive  CLINDAMYCIN <=0.25 SENSITIVE Sensitive     RIFAMPIN <=0.5 SENSITIVE Sensitive     Inducible Clindamycin NEGATIVE Sensitive     LINEZOLID 2 SENSITIVE Sensitive     * >=100,000 COLONIES/mL STAPHYLOCOCCUS AUREUS  C Difficile Quick Screen w PCR reflex     Status: Abnormal   Collection Time: 05/25/23  8:22 PM   Specimen: STOOL  Result Value Ref Range Status   C Diff antigen POSITIVE (A) NEGATIVE Final   C Diff toxin POSITIVE (A) NEGATIVE Final   C Diff interpretation Toxin producing C. difficile detected.  Final    Comment: CRITICAL RESULT CALLED TO, READ BACK BY AND VERIFIED WITH: Glo Herring RN ED @ 2106 05/25/23 lfd Performed at [facility], [address] [place], [place] [address]   Blood culture (routine x 2)     Status: None   Collection Time: 05/25/23  8:35 PM   Specimen: BLOOD  Result Value Ref Range Status   Specimen Description BLOOD BLOOD RIGHT FOREARM  Final   Special Requests   Final    BOTTLES DRAWN AEROBIC AND ANAEROBIC Blood Culture adequate volume   Culture   Final    NO GROWTH 5  DAYS Performed at [facility], [address], [place], [place] [address]    Report Status 05/30/2023 FINAL  Final  Blood culture (routine x 2)     Status: None   Collection Time: 05/25/23  8:35 PM   Specimen: BLOOD  Result Value Ref Range Status   Specimen Description BLOOD BLOOD RIGHT ARM  Final   Special Requests   Final    BOTTLES DRAWN AEROBIC AND ANAEROBIC Blood Culture adequate volume   Culture   Final    NO GROWTH 5 DAYS Performed at [facility], [address] [place], [place] [address]    Report Status 05/30/2023 FINAL  Final     Labs: BNP (last 3 results) No results for input(s): "BNP" in the last 8760 hours. Basic Metabolic Panel: Recent Labs  Lab 05/27/23 0636 05/28/23 0724 05/29/23 0556 05/31/23 0808 06/01/23 0340  NA 137 141 140 139 139  K 3.2* 3.7 3.7 3.3* 3.5  CL 110 114* 113* 108 110  CO2 18* 20* 22 26 24   GLUCOSE 75 100* 110* 96 100*  BUN 14 12 10 21  27*  CREATININE 1.16* 1.11* 1.09* 0.95 0.86  CALCIUM 7.9* 8.2* 8.1* 7.9* 8.0*  MG 2.4 2.4 2.2  --  2.0  PHOS 2.9  --  2.2*  --  2.4*   Liver Function Tests: No results for input(s): "AST", "ALT", "ALKPHOS", "BILITOT", "PROT", "ALBUMIN" in the last 168 hours. No results for input(s): "LIPASE", "AMYLASE" in the last 168 hours. No results for input(s): "AMMONIA" in the last 168 hours. CBC: Recent Labs  Lab 05/27/23 0636 05/30/23 0458 05/31/23 0808  WBC 7.0 5.1 4.8  NEUTROABS 6.1  --  2.5  HGB 10.6* 10.3* 9.9*  HCT 34.8* 32.9* 31.3*  MCV 91.3 88.7 87.7  PLT 214 232 209   Cardiac Enzymes: No results for input(s): "CKTOTAL", "CKMB", "CKMBINDEX", "TROPONINI" in the last 168 hours. BNP: Invalid input(s): "POCBNP" CBG: No results for input(s): "GLUCAP" in the last 168 hours. D-Dimer No results for input(s): "DDIMER" in the last 72 hours. Hgb A1c No results for input(s): "HGBA1C" in the last 72 hours. Lipid Profile No results for input(s): "CHOL", "HDL", "LDLCALC",  "TRIG", "CHOLHDL", "LDLDIRECT" in the last 72 hours. Thyroid function studies No results for input(s): "TSH", "T4TOTAL", "T3FREE", "  THYROIDAB" in the last 72 hours.  Invalid input(s): "FREET3" Anemia work up No results for input(s): "VITAMINB12", "FOLATE", "FERRITIN", "TIBC", "IRON", "RETICCTPCT" in the last 72 hours. Urinalysis    Component Value Date/Time   COLORURINE AMBER (A) 05/25/2023 1620   APPEARANCEUR TURBID (A) 05/25/2023 1620   APPEARANCEUR Cloudy (A) 10/21/2018 0841   LABSPEC 1.018 05/25/2023 1620   PHURINE 5.0 05/25/2023 1620   GLUCOSEU NEGATIVE 05/25/2023 1620   HGBUR MODERATE (A) 05/25/2023 1620   BILIRUBINUR NEGATIVE 05/25/2023 1620   BILIRUBINUR negative 07/15/2020 0827   BILIRUBINUR Negative 10/21/2018 0841   KETONESUR NEGATIVE 05/25/2023 1620   PROTEINUR 100 (A) 05/25/2023 1620   UROBILINOGEN 0.2 07/15/2020 0827   NITRITE POSITIVE (A) 05/25/2023 1620   LEUKOCYTESUR LARGE (A) 05/25/2023 1620   Sepsis Labs Recent Labs  Lab 05/27/23 0636 05/30/23 0458 05/31/23 0808  WBC 7.0 5.1 4.8   Microbiology Recent Results (from the past 240 hour(s))  Urine Culture     Status: Abnormal   Collection Time: 05/25/23  4:20 PM   Specimen: Urine, Clean Catch  Result Value Ref Range Status   Specimen Description   Final    URINE, CLEAN CATCH Performed at [facility], [address], [place], [place] [address]    Special Requests   Final    NONE Performed at [facility], [address] [place], [place] [address]    Culture (A)  Final    >=100,000 COLONIES/mL STAPHYLOCOCCUS AUREUS 30,000 COLONIES/mL KLEBSIELLA OXYTOCA Confirmed Extended Spectrum Beta-Lactamase Producer (ESBL).  In bloodstream infections from ESBL organisms, carbapenems are preferred over piperacillin/tazobactam. They are shown to have a lower risk of mortality.    Report Status 05/28/2023 FINAL  Final   Organism ID, Bacteria STAPHYLOCOCCUS AUREUS (A)  Final   Organism ID,  Bacteria KLEBSIELLA OXYTOCA (A)  Final      Susceptibility   Klebsiella oxytoca - MIC*    AMPICILLIN >=32 RESISTANT Resistant     CEFEPIME >=32 RESISTANT Resistant     CEFTRIAXONE >=64 RESISTANT Resistant     CIPROFLOXACIN >=4 RESISTANT Resistant     GENTAMICIN >=16 RESISTANT Resistant     IMIPENEM <=0.25 SENSITIVE Sensitive     NITROFURANTOIN 64 INTERMEDIATE Intermediate     TRIMETH/SULFA >=320 RESISTANT Resistant     AMPICILLIN/SULBACTAM >=32 RESISTANT Resistant     PIP/TAZO 32 INTERMEDIATE Intermediate     * 30,000 COLONIES/mL KLEBSIELLA OXYTOCA   Staphylococcus aureus - MIC*    CIPROFLOXACIN <=0.5 SENSITIVE Sensitive     GENTAMICIN <=0.5 SENSITIVE Sensitive     NITROFURANTOIN <=16 SENSITIVE Sensitive     OXACILLIN <=0.25 SENSITIVE Sensitive     TETRACYCLINE <=1 SENSITIVE Sensitive     VANCOMYCIN <=0.5 SENSITIVE Sensitive     TRIMETH/SULFA <=10 SENSITIVE Sensitive     CLINDAMYCIN <=0.25 SENSITIVE Sensitive     RIFAMPIN <=0.5 SENSITIVE Sensitive     Inducible Clindamycin NEGATIVE Sensitive     LINEZOLID 2 SENSITIVE Sensitive     * >=100,000 COLONIES/mL STAPHYLOCOCCUS AUREUS  C Difficile Quick Screen w PCR reflex     Status: Abnormal   Collection Time: 05/25/23  8:22 PM   Specimen: STOOL  Result Value Ref Range Status   C Diff antigen POSITIVE (A) NEGATIVE Final   C Diff toxin POSITIVE (A) NEGATIVE Final   C Diff interpretation Toxin producing C. difficile detected.  Final    Comment: CRITICAL RESULT CALLED TO, READ BACK BY AND VERIFIED WITH: Glo Herring RN ED @ [phone] 05/25/23  lfd Performed at [facility], [address] [place], [place] [address]   Blood culture (routine x 2)     Status: None   Collection Time: 05/25/23  8:35 PM   Specimen: BLOOD  Result Value Ref Range Status   Specimen Description BLOOD BLOOD RIGHT FOREARM  Final   Special Requests   Final    BOTTLES DRAWN AEROBIC AND ANAEROBIC Blood Culture adequate volume   Culture   Final    NO GROWTH 5  DAYS Performed at [facility], [address] [place], [place] [address]    Report Status 05/30/2023 FINAL  Final  Blood culture (routine x 2)     Status: None   Collection Time: 05/25/23  8:35 PM   Specimen: BLOOD  Result Value Ref Range Status   Specimen Description BLOOD BLOOD RIGHT ARM  Final   Special Requests   Final    BOTTLES DRAWN AEROBIC AND ANAEROBIC Blood Culture adequate volume   Culture   Final    NO GROWTH 5 DAYS Performed at [facility], [address] [place], [place] [address]    Report Status 05/30/2023 FINAL  Final     Time coordinating discharge: Over 30 minutes  SIGNED:   Tresa Moore, MD  [facility] 06/02/2023, 11:51 AM Pager   If 7PM-7AM, please contact night-coverage

## 2023-06-02 NOTE — Plan of Care (Signed)

## 2023-06-02 NOTE — TOC Progression Note (Addendum)
Transition of Care [facility]) - Progression Note    Patient Details  Name: Jeanne [name] MRN: [id number] Date of Birth: 04-Nov-1950  Transition of Care [facility]) CM/SW Contact  Bing Quarry, RN Phone Number: 06/02/2023, 11:24 AM  Clinical Narrative:  06/02/23: Patient is discharging to home with resumption of [facility] services via [facility], confirmed with Laurelyn Sickle, weekend on call representative.   Patient was admitted via ED via [facility] from home for diarrhea that started day before admission. Pt stated the diarrhea & abd pain were consistent with her previous diagnosis for C. Diff., and per triage notes, patient did not complete antibiotics for C. Diff. Multiple recent hospitalizations for recurrent C. Diff. And most recently for Campylobacter colitis.   Patient baseline PTA was bed bound at home with family provided ADL support and will resume with [facility] on discharge.   RX: Dificid x 10-day course to be delivered today prior to discharge by outpatient pharmacy arranged earlier.  Patient will be referred to Dr. Horace Porteous office as outpatient per DC provider notes.    Other chronic conditions to be aware of:  CKD stage IIIb, chronic bilateral hydronephrosis, left UPJ urolithiasis, recurrent UTIs, history of cervical cancer, rheumatoid arthritis   Update: [facility] to transport to home address. Medications confirmed delivered to patient earlier. EMS forms printed to Unit printer at [facility]. Update Unit RN.   Gabriel Cirri MSN RN CM  Transitions of Care Department [facility] [phone] Weekends Only   Expected Discharge Plan: Home w Home Health Services Barriers to Discharge: Barriers Resolved  Expected Discharge Plan and Services       Living arrangements for the past 2 months: Single Family Home Expected Discharge Date: 06/02/23               DME Arranged: N/A DME Agency: NA       HH Arranged: PT, OT HH Agency: [facility] Date HH Agency Contacted: 06/02/23 Time HH Agency  Contacted: 1122 Representative spoke with at [facility] Agency: Laurelyn Sickle   Social Determinants of Health (SDOH) Interventions SDOH Screenings   Food Insecurity: No Food Insecurity (05/27/2023)  Housing: Low Risk  (05/27/2023)  Transportation Needs: No Transportation Needs (05/27/2023)  Utilities: Not At Risk (05/27/2023)  Alcohol Screen: Low Risk  (08/10/2020)  Depression (PHQ2-9): Low Risk  (08/10/2020)  Financial Resource Strain: Low Risk  (04/24/2022)   Received from [facility], [facility]  Physical Activity: Inactive (08/10/2020)  Social Connections: Moderately Integrated (08/10/2020)  Stress: No Stress Concern Present (08/10/2020)  Tobacco Use: Medium Risk (05/27/2023)    Readmission Risk Interventions    03/01/2023   10:56 AM 01/02/2023   12:24 PM  Readmission Risk Prevention Plan  Transportation Screening Complete Complete  PCP or Specialist Appt within 3-5 Days Complete Complete  HRI or Home Care Consult Complete   Social Work Consult for Recovery Care Planning/Counseling Complete Complete  Palliative Care Screening Not Applicable Not Applicable  Medication Review Oceanographer) Complete Complete

## 2023-06-03 ENCOUNTER — Other Ambulatory Visit: Payer: Self-pay

## 2023-06-04 DIAGNOSIS — Z8541 Personal history of malignant neoplasm of cervix uteri: Secondary | ICD-10-CM | POA: Diagnosis not present

## 2023-06-04 DIAGNOSIS — N1832 Chronic kidney disease, stage 3b: Secondary | ICD-10-CM | POA: Diagnosis not present

## 2023-06-04 DIAGNOSIS — E538 Deficiency of other specified B group vitamins: Secondary | ICD-10-CM | POA: Diagnosis not present

## 2023-06-04 DIAGNOSIS — E559 Vitamin D deficiency, unspecified: Secondary | ICD-10-CM | POA: Diagnosis not present

## 2023-06-04 DIAGNOSIS — M069 Rheumatoid arthritis, unspecified: Secondary | ICD-10-CM | POA: Diagnosis not present

## 2023-06-04 DIAGNOSIS — A0472 Enterocolitis due to Clostridium difficile, not specified as recurrent: Secondary | ICD-10-CM | POA: Diagnosis not present

## 2023-06-04 DIAGNOSIS — Z8616 Personal history of COVID-19: Secondary | ICD-10-CM | POA: Diagnosis not present

## 2023-06-04 DIAGNOSIS — N136 Pyonephrosis: Secondary | ICD-10-CM | POA: Diagnosis not present

## 2023-06-04 DIAGNOSIS — I959 Hypotension, unspecified: Secondary | ICD-10-CM | POA: Diagnosis not present

## 2023-06-04 DIAGNOSIS — D631 Anemia in chronic kidney disease: Secondary | ICD-10-CM | POA: Diagnosis not present

## 2023-06-04 DIAGNOSIS — I5030 Unspecified diastolic (congestive) heart failure: Secondary | ICD-10-CM | POA: Diagnosis not present

## 2023-06-04 DIAGNOSIS — A4150 Gram-negative sepsis, unspecified: Secondary | ICD-10-CM | POA: Diagnosis not present

## 2023-06-04 DIAGNOSIS — Z96641 Presence of right artificial hip joint: Secondary | ICD-10-CM | POA: Diagnosis not present

## 2023-06-04 DIAGNOSIS — I130 Hypertensive heart and chronic kidney disease with heart failure and stage 1 through stage 4 chronic kidney disease, or unspecified chronic kidney disease: Secondary | ICD-10-CM | POA: Diagnosis not present

## 2023-06-04 DIAGNOSIS — A4101 Sepsis due to Methicillin susceptible Staphylococcus aureus: Secondary | ICD-10-CM | POA: Diagnosis not present

## 2023-06-06 ENCOUNTER — Other Ambulatory Visit: Payer: Self-pay

## 2023-06-06 ENCOUNTER — Encounter: Payer: Self-pay | Admitting: Dietician

## 2023-06-06 LAB — VITAMIN A: Vitamin A (Retinoic Acid): 9.2 ug/dL — ABNORMAL LOW (ref 22.0–69.5)

## 2023-06-06 NOTE — Progress Notes (Signed)
Nutrition Brief Note  Pt with h/o vitamin deficiency secondary to chronic diarrhea. Vitamin labs rechecked while pt was hospitalized and returned as follows:  Spoke with pt via phone, recommended:  Vitamin A 10,000 units po daily x 30 days Ergocalciferol 50,000 units po weekly x 6 weeks Zinc 50mg  (elemental) po daily x 30 days  Recommend follow up with PCP in 30-60 days after supplementation to recheck levels.   Betsey Holiday MS, RD, LDN Please refer to [facility] for RD and/or RD on-call/weekend/after hours pager

## 2023-06-11 DIAGNOSIS — I5030 Unspecified diastolic (congestive) heart failure: Secondary | ICD-10-CM | POA: Diagnosis not present

## 2023-06-11 DIAGNOSIS — N1832 Chronic kidney disease, stage 3b: Secondary | ICD-10-CM | POA: Diagnosis not present

## 2023-06-11 DIAGNOSIS — A4150 Gram-negative sepsis, unspecified: Secondary | ICD-10-CM | POA: Diagnosis not present

## 2023-06-11 DIAGNOSIS — D631 Anemia in chronic kidney disease: Secondary | ICD-10-CM | POA: Diagnosis not present

## 2023-06-11 DIAGNOSIS — A4101 Sepsis due to Methicillin susceptible Staphylococcus aureus: Secondary | ICD-10-CM | POA: Diagnosis not present

## 2023-06-11 DIAGNOSIS — N136 Pyonephrosis: Secondary | ICD-10-CM | POA: Diagnosis not present

## 2023-06-11 DIAGNOSIS — Z96641 Presence of right artificial hip joint: Secondary | ICD-10-CM | POA: Diagnosis not present

## 2023-06-11 DIAGNOSIS — E559 Vitamin D deficiency, unspecified: Secondary | ICD-10-CM | POA: Diagnosis not present

## 2023-06-11 DIAGNOSIS — E538 Deficiency of other specified B group vitamins: Secondary | ICD-10-CM | POA: Diagnosis not present

## 2023-06-11 DIAGNOSIS — I959 Hypotension, unspecified: Secondary | ICD-10-CM | POA: Diagnosis not present

## 2023-06-11 DIAGNOSIS — Z8616 Personal history of COVID-19: Secondary | ICD-10-CM | POA: Diagnosis not present

## 2023-06-11 DIAGNOSIS — M069 Rheumatoid arthritis, unspecified: Secondary | ICD-10-CM | POA: Diagnosis not present

## 2023-06-11 DIAGNOSIS — Z8541 Personal history of malignant neoplasm of cervix uteri: Secondary | ICD-10-CM | POA: Diagnosis not present

## 2023-06-11 DIAGNOSIS — I130 Hypertensive heart and chronic kidney disease with heart failure and stage 1 through stage 4 chronic kidney disease, or unspecified chronic kidney disease: Secondary | ICD-10-CM | POA: Diagnosis not present

## 2023-06-11 DIAGNOSIS — A0472 Enterocolitis due to Clostridium difficile, not specified as recurrent: Secondary | ICD-10-CM | POA: Diagnosis not present

## 2023-06-13 DIAGNOSIS — A4101 Sepsis due to Methicillin susceptible Staphylococcus aureus: Secondary | ICD-10-CM | POA: Diagnosis not present

## 2023-06-13 DIAGNOSIS — D631 Anemia in chronic kidney disease: Secondary | ICD-10-CM | POA: Diagnosis not present

## 2023-06-13 DIAGNOSIS — A4150 Gram-negative sepsis, unspecified: Secondary | ICD-10-CM | POA: Diagnosis not present

## 2023-06-13 DIAGNOSIS — I130 Hypertensive heart and chronic kidney disease with heart failure and stage 1 through stage 4 chronic kidney disease, or unspecified chronic kidney disease: Secondary | ICD-10-CM | POA: Diagnosis not present

## 2023-06-13 DIAGNOSIS — I5030 Unspecified diastolic (congestive) heart failure: Secondary | ICD-10-CM | POA: Diagnosis not present

## 2023-06-13 DIAGNOSIS — E538 Deficiency of other specified B group vitamins: Secondary | ICD-10-CM | POA: Diagnosis not present

## 2023-06-13 DIAGNOSIS — M069 Rheumatoid arthritis, unspecified: Secondary | ICD-10-CM | POA: Diagnosis not present

## 2023-06-13 DIAGNOSIS — I959 Hypotension, unspecified: Secondary | ICD-10-CM | POA: Diagnosis not present

## 2023-06-13 DIAGNOSIS — N1832 Chronic kidney disease, stage 3b: Secondary | ICD-10-CM | POA: Diagnosis not present

## 2023-06-13 DIAGNOSIS — Z8541 Personal history of malignant neoplasm of cervix uteri: Secondary | ICD-10-CM | POA: Diagnosis not present

## 2023-06-13 DIAGNOSIS — Z8616 Personal history of COVID-19: Secondary | ICD-10-CM | POA: Diagnosis not present

## 2023-06-13 DIAGNOSIS — A0472 Enterocolitis due to Clostridium difficile, not specified as recurrent: Secondary | ICD-10-CM | POA: Diagnosis not present

## 2023-06-13 DIAGNOSIS — Z96641 Presence of right artificial hip joint: Secondary | ICD-10-CM | POA: Diagnosis not present

## 2023-06-13 DIAGNOSIS — E559 Vitamin D deficiency, unspecified: Secondary | ICD-10-CM | POA: Diagnosis not present

## 2023-06-13 DIAGNOSIS — N136 Pyonephrosis: Secondary | ICD-10-CM | POA: Diagnosis not present

## 2023-06-18 DIAGNOSIS — I5030 Unspecified diastolic (congestive) heart failure: Secondary | ICD-10-CM | POA: Diagnosis not present

## 2023-06-18 DIAGNOSIS — Z96641 Presence of right artificial hip joint: Secondary | ICD-10-CM | POA: Diagnosis not present

## 2023-06-18 DIAGNOSIS — D631 Anemia in chronic kidney disease: Secondary | ICD-10-CM | POA: Diagnosis not present

## 2023-06-18 DIAGNOSIS — A0472 Enterocolitis due to Clostridium difficile, not specified as recurrent: Secondary | ICD-10-CM | POA: Diagnosis not present

## 2023-06-18 DIAGNOSIS — Z8541 Personal history of malignant neoplasm of cervix uteri: Secondary | ICD-10-CM | POA: Diagnosis not present

## 2023-06-18 DIAGNOSIS — M069 Rheumatoid arthritis, unspecified: Secondary | ICD-10-CM | POA: Diagnosis not present

## 2023-06-18 DIAGNOSIS — N136 Pyonephrosis: Secondary | ICD-10-CM | POA: Diagnosis not present

## 2023-06-18 DIAGNOSIS — I959 Hypotension, unspecified: Secondary | ICD-10-CM | POA: Diagnosis not present

## 2023-06-18 DIAGNOSIS — E538 Deficiency of other specified B group vitamins: Secondary | ICD-10-CM | POA: Diagnosis not present

## 2023-06-18 DIAGNOSIS — Z8616 Personal history of COVID-19: Secondary | ICD-10-CM | POA: Diagnosis not present

## 2023-06-18 DIAGNOSIS — A4150 Gram-negative sepsis, unspecified: Secondary | ICD-10-CM | POA: Diagnosis not present

## 2023-06-18 DIAGNOSIS — A4101 Sepsis due to Methicillin susceptible Staphylococcus aureus: Secondary | ICD-10-CM | POA: Diagnosis not present

## 2023-06-18 DIAGNOSIS — I130 Hypertensive heart and chronic kidney disease with heart failure and stage 1 through stage 4 chronic kidney disease, or unspecified chronic kidney disease: Secondary | ICD-10-CM | POA: Diagnosis not present

## 2023-06-18 DIAGNOSIS — N1832 Chronic kidney disease, stage 3b: Secondary | ICD-10-CM | POA: Diagnosis not present

## 2023-06-18 DIAGNOSIS — E559 Vitamin D deficiency, unspecified: Secondary | ICD-10-CM | POA: Diagnosis not present

## 2023-06-20 DIAGNOSIS — E538 Deficiency of other specified B group vitamins: Secondary | ICD-10-CM | POA: Diagnosis not present

## 2023-06-20 DIAGNOSIS — A4101 Sepsis due to Methicillin susceptible Staphylococcus aureus: Secondary | ICD-10-CM | POA: Diagnosis not present

## 2023-06-20 DIAGNOSIS — N136 Pyonephrosis: Secondary | ICD-10-CM | POA: Diagnosis not present

## 2023-06-20 DIAGNOSIS — I5030 Unspecified diastolic (congestive) heart failure: Secondary | ICD-10-CM | POA: Diagnosis not present

## 2023-06-20 DIAGNOSIS — M069 Rheumatoid arthritis, unspecified: Secondary | ICD-10-CM | POA: Diagnosis not present

## 2023-06-20 DIAGNOSIS — A0472 Enterocolitis due to Clostridium difficile, not specified as recurrent: Secondary | ICD-10-CM | POA: Diagnosis not present

## 2023-06-20 DIAGNOSIS — I959 Hypotension, unspecified: Secondary | ICD-10-CM | POA: Diagnosis not present

## 2023-06-20 DIAGNOSIS — D631 Anemia in chronic kidney disease: Secondary | ICD-10-CM | POA: Diagnosis not present

## 2023-06-20 DIAGNOSIS — A4150 Gram-negative sepsis, unspecified: Secondary | ICD-10-CM | POA: Diagnosis not present

## 2023-06-20 DIAGNOSIS — N1832 Chronic kidney disease, stage 3b: Secondary | ICD-10-CM | POA: Diagnosis not present

## 2023-06-20 DIAGNOSIS — Z8616 Personal history of COVID-19: Secondary | ICD-10-CM | POA: Diagnosis not present

## 2023-06-20 DIAGNOSIS — Z96641 Presence of right artificial hip joint: Secondary | ICD-10-CM | POA: Diagnosis not present

## 2023-06-20 DIAGNOSIS — I130 Hypertensive heart and chronic kidney disease with heart failure and stage 1 through stage 4 chronic kidney disease, or unspecified chronic kidney disease: Secondary | ICD-10-CM | POA: Diagnosis not present

## 2023-06-20 DIAGNOSIS — Z8541 Personal history of malignant neoplasm of cervix uteri: Secondary | ICD-10-CM | POA: Diagnosis not present

## 2023-06-20 DIAGNOSIS — E559 Vitamin D deficiency, unspecified: Secondary | ICD-10-CM | POA: Diagnosis not present

## 2023-06-27 DIAGNOSIS — Z8541 Personal history of malignant neoplasm of cervix uteri: Secondary | ICD-10-CM | POA: Diagnosis not present

## 2023-06-27 DIAGNOSIS — E538 Deficiency of other specified B group vitamins: Secondary | ICD-10-CM | POA: Diagnosis not present

## 2023-06-27 DIAGNOSIS — I5030 Unspecified diastolic (congestive) heart failure: Secondary | ICD-10-CM | POA: Diagnosis not present

## 2023-06-27 DIAGNOSIS — I959 Hypotension, unspecified: Secondary | ICD-10-CM | POA: Diagnosis not present

## 2023-06-27 DIAGNOSIS — E559 Vitamin D deficiency, unspecified: Secondary | ICD-10-CM | POA: Diagnosis not present

## 2023-06-27 DIAGNOSIS — Z8616 Personal history of COVID-19: Secondary | ICD-10-CM | POA: Diagnosis not present

## 2023-06-27 DIAGNOSIS — M069 Rheumatoid arthritis, unspecified: Secondary | ICD-10-CM | POA: Diagnosis not present

## 2023-06-27 DIAGNOSIS — Z96641 Presence of right artificial hip joint: Secondary | ICD-10-CM | POA: Diagnosis not present

## 2023-06-27 DIAGNOSIS — A0472 Enterocolitis due to Clostridium difficile, not specified as recurrent: Secondary | ICD-10-CM | POA: Diagnosis not present

## 2023-06-27 DIAGNOSIS — I130 Hypertensive heart and chronic kidney disease with heart failure and stage 1 through stage 4 chronic kidney disease, or unspecified chronic kidney disease: Secondary | ICD-10-CM | POA: Diagnosis not present

## 2023-06-27 DIAGNOSIS — N136 Pyonephrosis: Secondary | ICD-10-CM | POA: Diagnosis not present

## 2023-06-27 DIAGNOSIS — A4150 Gram-negative sepsis, unspecified: Secondary | ICD-10-CM | POA: Diagnosis not present

## 2023-06-27 DIAGNOSIS — A4101 Sepsis due to Methicillin susceptible Staphylococcus aureus: Secondary | ICD-10-CM | POA: Diagnosis not present

## 2023-06-27 DIAGNOSIS — D631 Anemia in chronic kidney disease: Secondary | ICD-10-CM | POA: Diagnosis not present

## 2023-06-27 DIAGNOSIS — N1832 Chronic kidney disease, stage 3b: Secondary | ICD-10-CM | POA: Diagnosis not present

## 2023-06-28 DIAGNOSIS — R319 Hematuria, unspecified: Secondary | ICD-10-CM | POA: Diagnosis not present

## 2023-06-29 ENCOUNTER — Other Ambulatory Visit: Payer: Self-pay

## 2023-06-29 ENCOUNTER — Emergency Department: Payer: Medicare HMO

## 2023-06-29 ENCOUNTER — Inpatient Hospital Stay
Admission: EM | Admit: 2023-06-29 | Discharge: 2023-07-05 | DRG: 872 | Disposition: A | Discharge status: Home Health | Payer: Medicare HMO | Attending: Internal Medicine | Admitting: Internal Medicine

## 2023-06-29 DIAGNOSIS — Z1624 Resistance to multiple antibiotics: Secondary | ICD-10-CM | POA: Diagnosis not present

## 2023-06-29 DIAGNOSIS — Z7401 Bed confinement status: Secondary | ICD-10-CM

## 2023-06-29 DIAGNOSIS — N1832 Chronic kidney disease, stage 3b: Secondary | ICD-10-CM | POA: Diagnosis present

## 2023-06-29 DIAGNOSIS — Z90710 Acquired absence of both cervix and uterus: Secondary | ICD-10-CM | POA: Diagnosis not present

## 2023-06-29 DIAGNOSIS — R6520 Severe sepsis without septic shock: Secondary | ICD-10-CM | POA: Diagnosis present

## 2023-06-29 DIAGNOSIS — Z803 Family history of malignant neoplasm of breast: Secondary | ICD-10-CM

## 2023-06-29 DIAGNOSIS — M069 Rheumatoid arthritis, unspecified: Secondary | ICD-10-CM | POA: Diagnosis not present

## 2023-06-29 DIAGNOSIS — Z87891 Personal history of nicotine dependence: Secondary | ICD-10-CM

## 2023-06-29 DIAGNOSIS — Z87442 Personal history of urinary calculi: Secondary | ICD-10-CM

## 2023-06-29 DIAGNOSIS — E872 Acidosis, unspecified: Secondary | ICD-10-CM | POA: Diagnosis present

## 2023-06-29 DIAGNOSIS — Z923 Personal history of irradiation: Secondary | ICD-10-CM

## 2023-06-29 DIAGNOSIS — N132 Hydronephrosis with renal and ureteral calculous obstruction: Secondary | ICD-10-CM | POA: Diagnosis present

## 2023-06-29 DIAGNOSIS — Z8541 Personal history of malignant neoplasm of cervix uteri: Secondary | ICD-10-CM | POA: Diagnosis not present

## 2023-06-29 DIAGNOSIS — Z8249 Family history of ischemic heart disease and other diseases of the circulatory system: Secondary | ICD-10-CM | POA: Diagnosis not present

## 2023-06-29 DIAGNOSIS — B379 Candidiasis, unspecified: Secondary | ICD-10-CM | POA: Diagnosis not present

## 2023-06-29 DIAGNOSIS — Z7409 Other reduced mobility: Secondary | ICD-10-CM | POA: Diagnosis present

## 2023-06-29 DIAGNOSIS — Z87440 Personal history of urinary (tract) infections: Secondary | ICD-10-CM

## 2023-06-29 DIAGNOSIS — R300 Dysuria: Secondary | ICD-10-CM | POA: Diagnosis present

## 2023-06-29 DIAGNOSIS — N179 Acute kidney failure, unspecified: Secondary | ICD-10-CM | POA: Diagnosis not present

## 2023-06-29 DIAGNOSIS — Z881 Allergy status to other antibiotic agents status: Secondary | ICD-10-CM

## 2023-06-29 DIAGNOSIS — N3040 Irradiation cystitis without hematuria: Secondary | ICD-10-CM | POA: Diagnosis not present

## 2023-06-29 DIAGNOSIS — A0471 Enterocolitis due to Clostridium difficile, recurrent: Secondary | ICD-10-CM | POA: Diagnosis not present

## 2023-06-29 DIAGNOSIS — A0472 Enterocolitis due to Clostridium difficile, not specified as recurrent: Secondary | ICD-10-CM | POA: Diagnosis not present

## 2023-06-29 DIAGNOSIS — N3281 Overactive bladder: Secondary | ICD-10-CM | POA: Diagnosis present

## 2023-06-29 DIAGNOSIS — A414 Sepsis due to anaerobes: Principal | ICD-10-CM | POA: Diagnosis present

## 2023-06-29 DIAGNOSIS — R109 Unspecified abdominal pain: Secondary | ICD-10-CM | POA: Diagnosis not present

## 2023-06-29 DIAGNOSIS — R110 Nausea: Secondary | ICD-10-CM | POA: Diagnosis not present

## 2023-06-29 DIAGNOSIS — N1330 Unspecified hydronephrosis: Secondary | ICD-10-CM | POA: Diagnosis not present

## 2023-06-29 DIAGNOSIS — I129 Hypertensive chronic kidney disease with stage 1 through stage 4 chronic kidney disease, or unspecified chronic kidney disease: Secondary | ICD-10-CM | POA: Diagnosis present

## 2023-06-29 DIAGNOSIS — R6889 Other general symptoms and signs: Secondary | ICD-10-CM | POA: Diagnosis not present

## 2023-06-29 DIAGNOSIS — Z880 Allergy status to penicillin: Secondary | ICD-10-CM

## 2023-06-29 DIAGNOSIS — Z743 Need for continuous supervision: Secondary | ICD-10-CM | POA: Diagnosis not present

## 2023-06-29 DIAGNOSIS — R262 Difficulty in walking, not elsewhere classified: Secondary | ICD-10-CM | POA: Diagnosis present

## 2023-06-29 DIAGNOSIS — R1084 Generalized abdominal pain: Secondary | ICD-10-CM | POA: Diagnosis not present

## 2023-06-29 DIAGNOSIS — A419 Sepsis, unspecified organism: Principal | ICD-10-CM

## 2023-06-29 DIAGNOSIS — N281 Cyst of kidney, acquired: Secondary | ICD-10-CM | POA: Diagnosis not present

## 2023-06-29 DIAGNOSIS — Z885 Allergy status to narcotic agent status: Secondary | ICD-10-CM

## 2023-06-29 DIAGNOSIS — M130 Polyarthritis, unspecified: Secondary | ICD-10-CM | POA: Diagnosis not present

## 2023-06-29 DIAGNOSIS — Z96653 Presence of artificial knee joint, bilateral: Secondary | ICD-10-CM | POA: Diagnosis present

## 2023-06-29 DIAGNOSIS — Y842 Radiological procedure and radiotherapy as the cause of abnormal reaction of the patient, or of later complication, without mention of misadventure at the time of the procedure: Secondary | ICD-10-CM | POA: Diagnosis present

## 2023-06-29 DIAGNOSIS — N3090 Cystitis, unspecified without hematuria: Secondary | ICD-10-CM

## 2023-06-29 DIAGNOSIS — Z79899 Other long term (current) drug therapy: Secondary | ICD-10-CM

## 2023-06-29 DIAGNOSIS — N390 Urinary tract infection, site not specified: Secondary | ICD-10-CM | POA: Diagnosis present

## 2023-06-29 DIAGNOSIS — Z96642 Presence of left artificial hip joint: Secondary | ICD-10-CM | POA: Diagnosis present

## 2023-06-29 DIAGNOSIS — R197 Diarrhea, unspecified: Secondary | ICD-10-CM | POA: Diagnosis not present

## 2023-06-29 LAB — CBC
HCT: 45.5 % (ref 36.0–46.0)
Hemoglobin: 13.8 g/dL (ref 12.0–15.0)
MCH: 27.8 pg (ref 26.0–34.0)
MCHC: 30.3 g/dL (ref 30.0–36.0)
MCV: 91.7 fL (ref 80.0–100.0)
Platelets: 301 10*3/uL (ref 150–400)
RBC: 4.96 MIL/uL (ref 3.87–5.11)
RDW: 15.1 % (ref 11.5–15.5)
WBC: 23 10*3/uL — ABNORMAL HIGH (ref 4.0–10.5)
nRBC: 0 % (ref 0.0–0.2)

## 2023-06-29 LAB — LACTIC ACID, PLASMA: Lactic Acid, Venous: 2.1 mmol/L (ref 0.5–1.9)

## 2023-06-29 LAB — COMPREHENSIVE METABOLIC PANEL
ALT: 17 U/L (ref 0–44)
AST: 23 U/L (ref 15–41)
Albumin: 3.2 g/dL — ABNORMAL LOW (ref 3.5–5.0)
Alkaline Phosphatase: 165 U/L — ABNORMAL HIGH (ref 38–126)
Anion gap: 12 (ref 5–15)
BUN: 28 mg/dL — ABNORMAL HIGH (ref 8–23)
CO2: 19 mmol/L — ABNORMAL LOW (ref 22–32)
Calcium: 8.9 mg/dL (ref 8.9–10.3)
Chloride: 106 mmol/L (ref 98–111)
Creatinine, Ser: 1.34 mg/dL — ABNORMAL HIGH (ref 0.44–1.00)
GFR, Estimated: 43 mL/min — ABNORMAL LOW (ref >60)
Glucose, Bld: 114 mg/dL — ABNORMAL HIGH (ref 70–99)
Potassium: 5.1 mmol/L (ref 3.5–5.1)
Sodium: 137 mmol/L (ref 135–145)
Total Bilirubin: 1 mg/dL (ref 0.3–1.2)
Total Protein: 7.1 g/dL (ref 6.5–8.1)

## 2023-06-29 LAB — C DIFFICILE QUICK SCREEN W PCR REFLEX
C Diff antigen: POSITIVE — AB
C Diff toxin: NEGATIVE

## 2023-06-29 LAB — CLOSTRIDIUM DIFFICILE BY PCR, REFLEXED: Toxigenic C. Difficile by PCR: POSITIVE — AB

## 2023-06-29 LAB — LIPASE, BLOOD: Lipase: 24 U/L (ref 11–51)

## 2023-06-29 MED ORDER — LACTATED RINGERS IV BOLUS
1000.0000 mL | Freq: Once | INTRAVENOUS | Status: AC
  Administered 2023-06-29: 1000 mL via INTRAVENOUS

## 2023-06-29 MED ORDER — HYDROMORPHONE HCL 1 MG/ML IJ SOLN
1.0000 mg | Freq: Once | INTRAMUSCULAR | Status: AC
  Administered 2023-06-29: 1 mg via INTRAVENOUS
  Filled 2023-06-29: qty 1

## 2023-06-29 MED ORDER — OXYCODONE-ACETAMINOPHEN 5-325 MG PO TABS: 1.0000 | ORAL_TABLET | ORAL | Status: DC | PRN

## 2023-06-29 MED ORDER — HYDROMORPHONE HCL 1 MG/ML IJ SOLN
0.5000 mg | INTRAMUSCULAR | Status: DC | PRN
  Administered 2023-06-30 – 2023-07-02 (×8): 0.5 mg via INTRAVENOUS
  Filled 2023-06-29 (×10): qty 0.5

## 2023-06-29 MED ORDER — LACTATED RINGERS IV SOLN: INTRAVENOUS | Status: DC

## 2023-06-29 MED ORDER — ACETAMINOPHEN 325 MG PO TABS: 650.0000 mg | ORAL_TABLET | Freq: Four times a day (QID) | ORAL | Status: DC | PRN

## 2023-06-29 MED ORDER — ONDANSETRON HCL 4 MG/2ML IJ SOLN
4.0000 mg | Freq: Three times a day (TID) | INTRAMUSCULAR | Status: DC | PRN
  Administered 2023-06-30: 4 mg via INTRAVENOUS
  Filled 2023-06-29: qty 2

## 2023-06-29 MED ORDER — RISAQUAD PO CAPS
1.0000 | ORAL_CAPSULE | Freq: Every day | ORAL | Status: DC
  Administered 2023-06-30 – 2023-07-05 (×6): 1 via ORAL
  Filled 2023-06-29 (×6): qty 1

## 2023-06-29 MED ORDER — FIDAXOMICIN 200 MG PO TABS
200.0000 mg | ORAL_TABLET | Freq: Two times a day (BID) | ORAL | Status: DC
  Administered 2023-06-30 – 2023-07-02 (×6): 200 mg via ORAL
  Filled 2023-06-29 (×6): qty 1

## 2023-06-29 MED ORDER — LACTATED RINGERS IV BOLUS
1000.0000 mL | Freq: Once | INTRAVENOUS | Status: AC
  Administered 2023-06-30: 1000 mL via INTRAVENOUS

## 2023-06-29 MED ORDER — IOHEXOL 300 MG/ML  SOLN
80.0000 mL | Freq: Once | INTRAMUSCULAR | Status: AC | PRN
  Administered 2023-06-29: 80 mL via INTRAVENOUS

## 2023-06-29 MED ORDER — ONDANSETRON HCL 4 MG/2ML IJ SOLN
4.0000 mg | Freq: Once | INTRAMUSCULAR | Status: AC
  Administered 2023-06-29: 4 mg via INTRAVENOUS
  Filled 2023-06-29: qty 2

## 2023-06-29 NOTE — ED Notes (Signed)
Stool collected and sent to lab.

## 2023-06-29 NOTE — ED Triage Notes (Signed)
Arrives via [facility] from home C/O diarrhea/abd pain x 2 hrs.  Vomited x 2 today as well.  History of + cdiff. PEr EMS reprot, VS wnl, with exception of HR at 120

## 2023-06-29 NOTE — H&P (Signed)
History and Physical    Jeanne [name] NWG:[id number] DOB: 1951/05/12 DOA: 06/29/2023  Referring MD/NP/PA:   PCP: Jerl Mina, MD   Patient coming from:  The patient is coming from home.     Chief Complaint: diarrhea and abdominal pain  HPI: Jeanne [name] is a 72 y.o. female with medical history significant of C. difficile colitis, pancolitis, rheumatoid arthritis with finger deformity, kidney stone, cervical cancer (s/p of radiation therapy),, hypotension, CKD-3b, chronic radiation cystitis, bed-bound, who presents with diarrhea and abdominal pain.  Patient states that she started having nausea, dry heaves, diarrhea, abdominal pain since yesterday.  She has had more than 5 times of watery diarrhea since yesterday.  Her abdominal pain is located in lower abdomen, intermittent, aching, mild, nonradiating, not aggravated or alleviated by any known factors.  No fever or chills.  Patient has dysuria, burning on urination and increased urinary frequency. Pt was tested positive for UTI yesterday. UA showed turbid appearance, 3+ leukocyte, positive nitrite, bacteria> 50 with budding yeast. Pt is given prescription for Levaquin.  Data reviewed independently and ED Course: pt was found to have C diff test (positive antigen, positive PCR negative toxin), WBC 23.0, lactic acid of 2.1, renal function close to baseline, temperature normal, blood pressure 107/64, heart rate 127, RR 22, oxygen saturation 97% on room air.  Patient is admitted to telemetry bed as inpatient.   CT abdomen/pelvis: 1. Nonobstructing intrarenal stones on the right. Right parapelvic cysts. No imaging follow-up is indicated. 2. Severe left renal atrophy. Hydronephrosis and hydroureter on the left appears progressed since prior study but may be chronic, resulting in the atrophy. The distal ureter is not visualized due to streak artifact from hip prostheses and a distal obstructing stone is not excluded. 3. No evidence of  bowel obstruction or inflammation. 4. Aortic atherosclerosis.   EKG: Not done in ED, will get one.    Review of Systems:   General: no fevers, chills, no body weight gain, has fatigue HEENT: no blurry vision, hearing changes or sore throat Respiratory: no dyspnea, coughing, wheezing CV: no chest pain, no palpitations GI: has nausea, vomiting, abdominal pain, diarrhea GU: has dysuria, burning on urination, increased urinary frequency, hematuria  Ext: no leg edema Neuro: no unilateral weakness, numbness, or tingling, no vision change or hearing loss Skin: no rash, no skin tear. MSK: No muscle spasm, has finger deformity  Heme: No easy bruising.  Travel history: No recent long distant travel.   Allergy:  Allergies  Allergen Reactions   Codeine Nausea Only and Nausea And Vomiting   Augmentin [Amoxicillin-Pot Clavulanate] Nausea Only    Very nauseated/ feels sore on body    Ceftriaxone Other (See Comments)    "Pins and needles" sensation in legs    Past Medical History:  Diagnosis Date   Bedbound    since R hip surgery, cannot bear weight on R leg. since 07/02/22. Has not walked since then   Cervical cancer (HCC)    CKD stage 3b, GFR 30-44 ml/min (HCC)    Complication of anesthesia    History of kidney stones    Hypertension    PONV (postoperative nausea and vomiting)    rheumatoidArthritis    RA    Past Surgical History:  Procedure Laterality Date   ABDOMINAL HYSTERECTOMY  09/10/2003   CYSTOGRAM N/A 08/04/2018   Procedure: CYSTOGRAM WITH URETHAL DILATION;  Surgeon: Vanna Scotland, MD;  Location: [facility];  Service: Urology;  Laterality: N/A;   CYSTOSCOPY W/ RETROGRADES  Bilateral 08/04/2018   Procedure: CYSTOSCOPY WITH RETROGRADE PYELOGRAM;  Surgeon: Vanna Scotland, MD;  Location: [facility];  Service: Urology;  Laterality: Bilateral;   CYSTOSCOPY/URETEROSCOPY/HOLMIUM LASER/STENT PLACEMENT Left 08/04/2018   Procedure: CYSTOSCOPY/URETEROSCOPY/HOLMIUM LASER/STENT  PLACEMENT;  Surgeon: Vanna Scotland, MD;  Location: [facility];  Service: Urology;  Laterality: Left;   JOINT REPLACEMENT     TOTAL HIP ARTHROPLASTY Right 1972   TOTAL HIP ARTHROPLASTY Left 1981   TOTAL KNEE ARTHROPLASTY Right 1990    Social History:  reports that she quit smoking about 33 years ago. Her smoking use included cigarettes. She started smoking about 53 years ago. She has never used smokeless tobacco. She reports that she does not drink alcohol and does not use drugs.  Family History:  Family History  Problem Relation Age of Onset   Breast cancer Mother    Hypertension Father    Cancer Father    Prostate cancer Neg Hx    Kidney cancer Neg Hx    Bladder Cancer Neg Hx      Prior to Admission medications   Medication Sig Start Date End Date Taking? Authorizing Provider  gabapentin (NEURONTIN) 100 MG capsule Take 100 mg by mouth at bedtime. 10/16/22 10/16/23  [provider]  loperamide (IMODIUM) 2 MG capsule Take 1 capsule (2 mg total) by mouth every 4 (four) hours as needed for diarrhea or loose stools. 03/02/23   Wouk, Wilfred Curtis, MD  midodrine (PROAMATINE) 5 MG tablet Take 5 mg by mouth 3 (three) times daily with meals.    [provider]  oxyCODONE (OXY IR/ROXICODONE) 5 MG immediate release tablet Take 1 tablet (5 mg total) by mouth every 6 (six) hours as needed. 01/19/23   Gillis Santa, MD  vitamin A 3 MG (10000 UNITS) capsule Take 1 capsule (10,000 Units total) by mouth daily. 06/02/23 07/02/23  Tresa Moore, MD  Vitamin D, Ergocalciferol, (DRISDOL) 1.25 MG (50000 UNIT) CAPS capsule Take 1 capsule (50,000 Units total) by mouth every 7 (seven) days. 06/08/23 07/08/23  Tresa Moore, MD  zinc sulfate 220 (50 Zn) MG capsule Take 1 capsule (220 mg total) by mouth daily. 06/03/23 07/03/23  Tresa Moore, MD    Physical Exam: Vitals:   06/30/23 0015 06/30/23 0030 06/30/23 0045 06/30/23 0119  BP:  (!) 96/59    Pulse: (!) 109 (!) 103 (!) 105    Resp: 14  16   Temp:    98.3 F (36.8 C)  TempSrc:    Oral  SpO2: 96% 95% 96%   Weight:       General: Not in acute distress HEENT:       Eyes: PERRL, EOMI, no jaundice       ENT: No discharge from the ears and nose, no pharynx injection, no tonsillar enlargement.        Neck: No JVD, no bruit, no mass felt. Heme: No neck lymph node enlargement. Cardiac: S1/S2, RRR, No murmurs, No gallops or rubs. Respiratory: No rales, wheezing, rhonchi or rubs. GI: Soft, nondistended, has mild tenderness in lower abdomen, no rebound pain, no organomegaly, BS present. GU: No hematuria Ext: No pitting leg edema bilaterally. 1+DP/PT pulse bilaterally. Musculoskeletal: Has finger deformity.  No joint redness or warmth. Pt is bed-bound Skin: No rashes.  Neuro: Alert, oriented X3, cranial nerves II-XII grossly intact. Psych: Patient is not psychotic, no suicidal or hemocidal ideation.  Labs on Admission: I have personally reviewed following labs and imaging studies  CBC: Recent Labs  Lab  06/29/23 2017  WBC 23.0*  HGB 13.8  HCT 45.5  MCV 91.7  PLT 301   Basic Metabolic Panel: Recent Labs  Lab 06/29/23 2017 06/29/23 2240  NA 137  --   K 5.1  --   CL 106  --   CO2 19*  --   GLUCOSE 114*  --   BUN 28*  --   CREATININE 1.34*  --   CALCIUM 8.9  --   MG  --  1.9  PHOS  --  4.0   GFR: Estimated Creatinine Clearance: 33.7 mL/min (A) (by C-G formula based on SCr of 1.34 mg/dL (H)). Liver Function Tests: Recent Labs  Lab 06/29/23 2017  AST 23  ALT 17  ALKPHOS 165*  BILITOT 1.0  PROT 7.1  ALBUMIN 3.2*   Recent Labs  Lab 06/29/23 2017  LIPASE 24   No results for input(s): "AMMONIA" in the last 168 hours. Coagulation Profile: No results for input(s): "INR", "PROTIME" in the last 168 hours. Cardiac Enzymes: No results for input(s): "CKTOTAL", "CKMB", "CKMBINDEX", "TROPONINI" in the last 168 hours. BNP (last 3 results) No results for input(s): "PROBNP" in the last 8760  hours. HbA1C: No results for input(s): "HGBA1C" in the last 72 hours. CBG: No results for input(s): "GLUCAP" in the last 168 hours. Lipid Profile: No results for input(s): "CHOL", "HDL", "LDLCALC", "TRIG", "CHOLHDL", "LDLDIRECT" in the last 72 hours. Thyroid Function Tests: No results for input(s): "TSH", "T4TOTAL", "FREET4", "T3FREE", "THYROIDAB" in the last 72 hours. Anemia Panel: No results for input(s): "VITAMINB12", "FOLATE", "FERRITIN", "TIBC", "IRON", "RETICCTPCT" in the last 72 hours. Urine analysis:    Component Value Date/Time   COLORURINE AMBER (A) 05/25/2023 1620   APPEARANCEUR TURBID (A) 05/25/2023 1620   APPEARANCEUR Cloudy (A) 10/21/2018 0841   LABSPEC 1.018 05/25/2023 1620   PHURINE 5.0 05/25/2023 1620   GLUCOSEU NEGATIVE 05/25/2023 1620   HGBUR MODERATE (A) 05/25/2023 1620   BILIRUBINUR NEGATIVE 05/25/2023 1620   BILIRUBINUR negative 07/15/2020 0827   BILIRUBINUR Negative 10/21/2018 0841   KETONESUR NEGATIVE 05/25/2023 1620   PROTEINUR 100 (A) 05/25/2023 1620   UROBILINOGEN 0.2 07/15/2020 0827   NITRITE POSITIVE (A) 05/25/2023 1620   LEUKOCYTESUR LARGE (A) 05/25/2023 1620   Sepsis Labs: @LABRCNTIP (procalcitonin:4,lacticidven:4) ) Recent Results (from the past 240 hour(s))  C Difficile Quick Screen w PCR reflex     Status: Abnormal   Collection Time: 06/29/23  7:06 PM   Specimen: STOOL  Result Value Ref Range Status   C Diff antigen POSITIVE (A) NEGATIVE Final   C Diff toxin NEGATIVE NEGATIVE Final   C Diff interpretation Results are indeterminate. See PCR results.  Final    Comment: Performed at [facility], [address] [place], [place] [address]  C. Diff by PCR, Reflexed     Status: Abnormal   Collection Time: 06/29/23  7:06 PM  Result Value Ref Range Status   Toxigenic C. Difficile by PCR POSITIVE (A) NEGATIVE Final    Comment: Positive for toxigenic C. difficile with little to no toxin production. Only treat if clinical presentation  suggests symptomatic illness. CRITICAL RESULT CALLED TO, READ BACK BY AND VERIFIED WITH: ROSE COBURN RN @ [phone] 06/29/23 [facility] Performed at [facility], [address] [place], [place] [address]   Gastrointestinal Panel by PCR , Stool     Status: None   Collection Time: 06/29/23  7:06 PM   Specimen: Stool  Result Value Ref Range Status   Campylobacter species NOT DETECTED NOT  DETECTED Final   Plesimonas shigelloides NOT DETECTED NOT DETECTED Final   Salmonella species NOT DETECTED NOT DETECTED Final   Yersinia enterocolitica NOT DETECTED NOT DETECTED Final   Vibrio species NOT DETECTED NOT DETECTED Final   Vibrio cholerae NOT DETECTED NOT DETECTED Final   Enteroaggregative E coli (EAEC) NOT DETECTED NOT DETECTED Final   Enteropathogenic E coli (EPEC) NOT DETECTED NOT DETECTED Final   Enterotoxigenic E coli (ETEC) NOT DETECTED NOT DETECTED Final   Shiga like toxin producing E coli (STEC) NOT DETECTED NOT DETECTED Final   Shigella/Enteroinvasive E coli (EIEC) NOT DETECTED NOT DETECTED Final   Cryptosporidium NOT DETECTED NOT DETECTED Final   Cyclospora cayetanensis NOT DETECTED NOT DETECTED Final   Entamoeba histolytica NOT DETECTED NOT DETECTED Final   Giardia lamblia NOT DETECTED NOT DETECTED Final   Adenovirus F40/41 NOT DETECTED NOT DETECTED Final   Astrovirus NOT DETECTED NOT DETECTED Final   Norovirus GI/GII NOT DETECTED NOT DETECTED Final   Rotavirus A NOT DETECTED NOT DETECTED Final   Sapovirus (I, II, IV, and V) NOT DETECTED NOT DETECTED Final    Comment: Performed at [facility], [address] [place], [place] [address]     Radiological Exams on Admission: CT ABDOMEN PELVIS W CONTRAST  Result Date: 06/30/2023 CLINICAL DATA:  Acute nonlocalized abdominal pain. Diarrhea and abdominal pain for 2 hours. Vomiting. History of C difficile. EXAM: CT ABDOMEN AND PELVIS WITH CONTRAST TECHNIQUE: Multidetector CT imaging of the abdomen and pelvis was performed  using the standard protocol following bolus administration of intravenous contrast. RADIATION DOSE REDUCTION: This exam was performed according to the departmental dose-optimization program which includes automated exposure control, adjustment of the mA and/or kV according to patient size and/or use of iterative reconstruction technique. CONTRAST:  80mL OMNIPAQUE IOHEXOL 300 MG/ML  SOLN COMPARISON:  05/25/2023 FINDINGS: Lower chest: Lung bases are clear. Hepatobiliary: No focal liver abnormality is seen. No gallstones, gallbladder wall thickening, or biliary dilatation. Pancreas: Fatty infiltration of the pancreas. No acute inflammatory changes. Spleen: Normal in size without focal abnormality. Adrenals/Urinary Tract: No adrenal gland nodules. Bilateral renal parenchymal atrophy, more severe on the left. Prominent parapelvic cysts in the right. No imaging follow-up is indicated. Right renal collecting system and ureter are decompressed. Two right intrarenal stones, largest measuring 2 mm diameter. The left kidney demonstrates hydronephrosis with prominent hydroureter. Distal ureter is not visualized due to streak artifact from bilateral hip arthroplasties. Ureterectasis is increased since the prior study. A distal ureteral stone or possibly an obstructing lesion can not be excluded. The bladder is also mostly obscured visualization. There appears to be gas in the bladder which could indicate cystitis or previous catheterization. Stomach/Bowel: Stomach is filled with ingested material. No wall thickening. Small bowel, and colon are not abnormally distended. No wall thickening or inflammatory changes are appreciated. Visualization of the rectosigmoid colon is limited due to streak artifact from the hip prostheses. Appendix is not identified. Vascular/Lymphatic: No significant vascular findings are present. No enlarged abdominal or pelvic lymph nodes. Surgical clips in the paracaval upper pelvis. Reproductive: No  pelvic mass identified. Other: No free air or free fluid in the abdomen. Stranding in the right flank and hip soft tissues likely representing contusion or edema. Musculoskeletal: Bilateral hip arthroplasties. Streak artifact limits evaluation. Compression deformities at T12, L1, and L5. No change since prior study. No acute bony abnormalities are identified. IMPRESSION: 1. Nonobstructing intrarenal stones on the right. Right parapelvic cysts. No imaging follow-up is indicated. 2. Severe  left renal atrophy. Hydronephrosis and hydroureter on the left appears progressed since prior study but may be chronic, resulting in the atrophy. The distal ureter is not visualized due to streak artifact from hip prostheses and a distal obstructing stone is not excluded. 3. No evidence of bowel obstruction or inflammation. 4. Aortic atherosclerosis. Electronically Signed   By: Burman Nieves M.D.   On: 06/30/2023 00:18      Assessment/Plan Principal Problem:   C. difficile colitis Active Problems:   Severe sepsis (HCC)   CKD stage 3b, GFR 30-44 ml/min (HCC)   Arthritis or polyarthritis, rheumatoid (HCC)   UTI (urinary tract infection)   Assessment and Plan:   Severe sepsis due to C. difficile colitis: Patient has recurrent C. difficile colitis.  Patient meets criteria for severe sepsis with WBC WBC 23.0, lactic acid 2.1, heart rate 127, RR 22.  Blood pressure is soft 96/59.  -will admit to tele bed as inpt -Start Dificid 200 mg bid -f/u blood culture and GI path panel which is ordered by EDP -IVF: 2 L LR bolus, then 75 cc/h -Trend lactic acid level -Check procalcitonin level -Resume home midodrine due to soft blood pressure  CKD stage 3b, GFR 30-44 ml/min (HCC):  Baseline creatinine 1.0-1.3, but recent creatinine was 0.84 on 06/01/2023. Her creatinine is 1.34, BUN 28, GFR 43 today, slightly worsening. -Follow-up with BMP -IV fluid as above  Arthritis or polyarthritis, rheumatoid (HCC) -Continue home  as needed oxycodone  Hydronephrosis and hydroureter: CT showed hydronephrosis and hydroureter on the left appears progressed since prior study but may be chronic, resulting in the atrophy. The distal ureter is not visualized due to streak artifact from hip prostheses and a distal obstructing stone is not excluded.  Her kidney function is slightly worsening. -Follow-up with BMP -May need to discuss with urology if renal function getting worse  UTI: Patient has history of resistant Klebsiella oxytoca -Started meropenem -Repeat urinalysis pending and follow-up urine culture -will start fluconazole 100 mg daily for yeast infection   DVT ppx: sq lovenox  Code Status: Full code    Family Communication:  Yes, patient's daughter   at bed side.  Disposition Plan:  Anticipate discharge back to previous environment  Consults called:  none  Admission status and Level of care: Telemetry Medical:   as inpt      Dispo: The patient is from: Home              Anticipated d/c is to: Home              Anticipated d/c date is: 2 days              Patient currently is not medically stable to d/c.    Severity of Illness:  The appropriate patient status for this patient is INPATIENT. Inpatient status is judged to be reasonable and necessary in order to provide the required intensity of service to ensure the patient's safety. The patient's presenting symptoms, physical exam findings, and initial radiographic and laboratory data in the context of their chronic comorbidities is felt to place them at high risk for further clinical deterioration. Furthermore, it is not anticipated that the patient will be medically stable for discharge from the hospital within 2 midnights of admission.   * I certify that at the point of admission it is my clinical judgment that the patient will require inpatient hospital care spanning beyond 2 midnights from the point of admission due to high intensity of service,  high risk  for further deterioration and high frequency of surveillance required.*       Date of Service 06/30/2023    Lorretta Harp [facility]   If 7PM-7AM, please contact night-coverage [email or web address] 06/30/2023, 1:47 AM

## 2023-06-29 NOTE — ED Provider Notes (Signed)
[facility] Provider Note    Event Date/Time   First MD Initiated Contact with Patient 06/29/23 2005     (approximate)   History   Chief Complaint: Abdominal Pain   HPI  Jeanne [name] is a 72 y.o. female with a history of CKD, recurrent C. difficile colitis, hypertension who comes to the ED complaining of malaise for the past 3 days, and developing generalized abdominal pain and profuse watery diarrhea earlier today.  Denies fever chest pain or shortness of breath.     Physical Exam   Triage Vital Signs: ED Triage Vitals  Encounter Vitals Group     BP 06/29/23 1834 103/61     Systolic BP Percentile --      Diastolic BP Percentile --      Pulse Rate 06/29/23 1834 (!) 127     Resp 06/29/23 1834 16     Temp 06/29/23 1834 98.3 F (36.8 C)     Temp Source 06/29/23 1834 Oral     SpO2 06/29/23 1834 95 %     Weight 06/29/23 1833 137 lb 5.6 oz (62.3 kg)     Height --      Head Circumference --      Peak Flow --      Pain Score --      Pain Loc --      Pain Education --      Exclude from Growth Chart --     Most recent vital signs: Vitals:   06/29/23 2245 06/29/23 2300  BP:  107/64  Pulse: (!) 104 (!) 104  Resp:    Temp:    SpO2: 97%     General: Awake, no distress.  CV:  Good peripheral perfusion.  Tachycardia heart rate 105 Resp:  Normal effort.  Clear to auscultation bilaterally Abd:  No distention.,  Soft, diffuse tenderness Other:  Dry oral mucosa   ED Results / Procedures / Treatments   Labs (all labs ordered are listed, but only abnormal results are displayed) Labs Reviewed  C DIFFICILE QUICK SCREEN W PCR REFLEX   - Abnormal; Notable for the following components:      Result Value   C Diff antigen POSITIVE (*)    All other components within normal limits  CLOSTRIDIUM DIFFICILE BY PCR, REFLEXED - Abnormal; Notable for the following components:   Toxigenic C. Difficile by PCR POSITIVE (*)    All other components within  normal limits  COMPREHENSIVE METABOLIC PANEL - Abnormal; Notable for the following components:   CO2 19 (*)    Glucose, Bld 114 (*)    BUN 28 (*)    Creatinine, Ser 1.34 (*)    Albumin 3.2 (*)    Alkaline Phosphatase 165 (*)    GFR, Estimated 43 (*)    All other components within normal limits  CBC - Abnormal; Notable for the following components:   WBC 23.0 (*)    All other components within normal limits  LACTIC ACID, PLASMA - Abnormal; Notable for the following components:   Lactic Acid, Venous 2.1 (*)    All other components within normal limits  CULTURE, BLOOD (ROUTINE X 2)  CULTURE, BLOOD (ROUTINE X 2)  GASTROINTESTINAL PANEL BY PCR, STOOL (REPLACES STOOL CULTURE)  LIPASE, BLOOD  URINALYSIS, W/ REFLEX TO CULTURE (INFECTION SUSPECTED)  LACTIC ACID, PLASMA  PROCALCITONIN  MAGNESIUM  PHOSPHORUS  BASIC METABOLIC PANEL  CBC     EKG    RADIOLOGY CT abdomen pelvis interpreted  by me, no free air or megacolon.  Radiology report reviewed   PROCEDURES:  .Critical Care  Performed by: Sharman Cheek, MD Authorized by: Sharman Cheek, MD   Critical care provider statement:    Critical care time (minutes):  35   Critical care time was exclusive of:  Separately billable procedures and treating other patients   Critical care was necessary to treat or prevent imminent or life-threatening deterioration of the following conditions:  Sepsis   Critical care was time spent personally by me on the following activities:  Development of treatment plan with patient or surrogate, discussions with consultants, evaluation of patient's response to treatment, examination of patient, obtaining history from patient or surrogate, ordering and performing treatments and interventions, ordering and review of laboratory studies, ordering and review of radiographic studies, pulse oximetry, re-evaluation of patient's condition and review of old charts   Care discussed with: admitting provider       MEDICATIONS ORDERED IN ED: Medications  fidaxomicin (DIFICID) tablet 200 mg (has no administration in time range)  ondansetron (ZOFRAN) injection 4 mg (has no administration in time range)  acetaminophen (TYLENOL) tablet 650 mg (has no administration in time range)  HYDROmorphone (DILAUDID) injection 0.5 mg (has no administration in time range)  oxyCODONE-acetaminophen (PERCOCET/ROXICET) 5-325 MG per tablet 1 tablet (has no administration in time range)  lactated ringers bolus 1,000 mL (has no administration in time range)  lactated ringers infusion (has no administration in time range)  acidophilus (RISAQUAD) capsule 1 capsule (has no administration in time range)  lactated ringers bolus 1,000 mL (0 mLs Intravenous Stopped 06/29/23 2308)  HYDROmorphone (DILAUDID) injection 1 mg (1 mg Intravenous Given 06/29/23 2118)  ondansetron (ZOFRAN) injection 4 mg (4 mg Intravenous Given 06/29/23 2118)  iohexol (OMNIPAQUE) 300 MG/ML solution 80 mL (80 mLs Intravenous Contrast Given 06/29/23 2204)     IMPRESSION / MDM / ASSESSMENT AND PLAN / ED COURSE  I reviewed the triage vital signs and the nursing notes.  DDx: C. difficile colitis, diverticulitis, bowel perforation, intra-abdominal abscess, dehydration, AKI, electrolyte abnormality, sepsis  Patient's presentation is most consistent with acute presentation with potential threat to life or bodily function.  Patient presents with tachycardia, abdominal pain and diarrhea concerning for recurrent C. difficile colitis.  Has a leukocytosis of 23,000.  IV fluids given, subsequent workup confirming recurrent C. difficile.  Also has UTI based on urinalysis performed at [facility] clinic yesterday seen in care everywhere.  Her UTIs have been multidrug-resistant in the past requiring carbapenems.       FINAL CLINICAL IMPRESSION(S) / ED DIAGNOSES   Final diagnoses:  C. difficile colitis  Cystitis  Sepsis without acute organ dysfunction, due to  unspecified organism [facility])     Rx / DC Orders   ED Discharge Orders     None        Note:  This document was prepared using Dragon voice recognition software and may include unintentional dictation errors.   Sharman Cheek, MD 06/30/23 [phone]

## 2023-06-29 NOTE — ED Notes (Signed)
Lab consulted for blood drawl, nursing staff unsuccessful

## 2023-06-30 DIAGNOSIS — A0472 Enterocolitis due to Clostridium difficile, not specified as recurrent: Secondary | ICD-10-CM

## 2023-06-30 DIAGNOSIS — A419 Sepsis, unspecified organism: Secondary | ICD-10-CM | POA: Diagnosis present

## 2023-06-30 LAB — URINALYSIS, W/ REFLEX TO CULTURE (INFECTION SUSPECTED)
Bilirubin Urine: NEGATIVE
Glucose, UA: NEGATIVE mg/dL
Ketones, ur: NEGATIVE mg/dL
Nitrite: POSITIVE — AB
Protein, ur: NEGATIVE mg/dL
Specific Gravity, Urine: 1.033 — ABNORMAL HIGH (ref 1.005–1.030)
Squamous Epithelial / HPF: 0 /[HPF] (ref 0–5)
WBC, UA: >50 WBC/hpf (ref 0–5)
pH: 6 (ref 5.0–8.0)

## 2023-06-30 LAB — PHOSPHORUS: Phosphorus: 4 mg/dL (ref 2.5–4.6)

## 2023-06-30 LAB — GASTROINTESTINAL PANEL BY PCR, STOOL (REPLACES STOOL CULTURE)

## 2023-06-30 LAB — BASIC METABOLIC PANEL
Anion gap: 8 (ref 5–15)
BUN: 26 mg/dL — ABNORMAL HIGH (ref 8–23)
CO2: 23 mmol/L (ref 22–32)
Calcium: 8.5 mg/dL — ABNORMAL LOW (ref 8.9–10.3)
Chloride: 106 mmol/L (ref 98–111)
Creatinine, Ser: 1.25 mg/dL — ABNORMAL HIGH (ref 0.44–1.00)
GFR, Estimated: 46 mL/min — ABNORMAL LOW (ref >60)
Glucose, Bld: 100 mg/dL — ABNORMAL HIGH (ref 70–99)
Potassium: 4.4 mmol/L (ref 3.5–5.1)
Sodium: 137 mmol/L (ref 135–145)

## 2023-06-30 LAB — CBC
HCT: 37.9 % (ref 36.0–46.0)
Hemoglobin: 11.5 g/dL — ABNORMAL LOW (ref 12.0–15.0)
MCH: 28.2 pg (ref 26.0–34.0)
MCHC: 30.3 g/dL (ref 30.0–36.0)
MCV: 92.9 fL (ref 80.0–100.0)
Platelets: 312 10*3/uL (ref 150–400)
RBC: 4.08 MIL/uL (ref 3.87–5.11)
RDW: 15.2 % (ref 11.5–15.5)
WBC: 12.3 10*3/uL — ABNORMAL HIGH (ref 4.0–10.5)
nRBC: 0 % (ref 0.0–0.2)

## 2023-06-30 LAB — LACTIC ACID, PLASMA: Lactic Acid, Venous: 1.5 mmol/L (ref 0.5–1.9)

## 2023-06-30 LAB — PROCALCITONIN: Procalcitonin: 0.79 ng/mL

## 2023-06-30 LAB — MAGNESIUM: Magnesium: 1.9 mg/dL (ref 1.7–2.4)

## 2023-06-30 MED ORDER — SODIUM CHLORIDE 0.9 % IV SOLN
1.0000 g | Freq: Two times a day (BID) | INTRAVENOUS | Status: DC
  Administered 2023-06-30 – 2023-07-01 (×4): 1 g via INTRAVENOUS
  Filled 2023-06-30 (×4): qty 20

## 2023-06-30 MED ORDER — GABAPENTIN 100 MG PO CAPS
100.0000 mg | ORAL_CAPSULE | Freq: Every day | ORAL | Status: DC
  Administered 2023-06-30 – 2023-07-04 (×6): 100 mg via ORAL
  Filled 2023-06-30 (×6): qty 1

## 2023-06-30 MED ORDER — SODIUM CHLORIDE 0.9 % IV BOLUS
250.0000 mL | Freq: Once | INTRAVENOUS | Status: AC
  Administered 2023-06-30: 250 mL via INTRAVENOUS

## 2023-06-30 MED ORDER — ENOXAPARIN SODIUM 40 MG/0.4ML IJ SOSY
40.0000 mg | PREFILLED_SYRINGE | INTRAMUSCULAR | Status: DC
  Administered 2023-06-30 – 2023-07-05 (×6): 40 mg via SUBCUTANEOUS
  Filled 2023-06-30 (×6): qty 0.4

## 2023-06-30 MED ORDER — DEXTROSE-SODIUM CHLORIDE 5-0.9 % IV SOLN: INTRAVENOUS | Status: AC

## 2023-06-30 MED ORDER — FLUCONAZOLE 100 MG PO TABS
100.0000 mg | ORAL_TABLET | Freq: Every day | ORAL | Status: DC
  Administered 2023-06-30 – 2023-07-02 (×3): 100 mg via ORAL
  Filled 2023-06-30 (×3): qty 1

## 2023-06-30 MED ORDER — MIDODRINE HCL 5 MG PO TABS: 5.0000 mg | ORAL_TABLET | Freq: Three times a day (TID) | ORAL | Status: DC

## 2023-06-30 MED ORDER — OXYCODONE HCL 5 MG PO TABS
5.0000 mg | ORAL_TABLET | Freq: Four times a day (QID) | ORAL | Status: DC | PRN
  Administered 2023-06-30 – 2023-07-01 (×2): 5 mg via ORAL
  Filled 2023-06-30 (×2): qty 1

## 2023-06-30 MED ORDER — MIDODRINE HCL 5 MG PO TABS
5.0000 mg | ORAL_TABLET | Freq: Three times a day (TID) | ORAL | Status: DC
  Administered 2023-06-30 (×3): 5 mg via ORAL
  Filled 2023-06-30 (×3): qty 1

## 2023-06-30 NOTE — ED Notes (Signed)
Pt rectal tube has come out again. Pt cleaned of stool, new chucks placed.

## 2023-06-30 NOTE — Consult Note (Signed)
Pharmacy Antibiotic Note  Jeanne [name] is a 72 y.o. female admitted on 06/29/2023 with UTI and abdominal pain with diarrhea .  Patient has a history of cdiff and MDR UTI in the past, most recently ESBL Klebsiella UTI with Staph Aureus in September of this year. Pharmacy has been consulted for Meropenem dosing.  Plan: Meropenem 1g Q12 hours  Weight: 62.3 kg (137 lb 5.6 oz)  Temp (24hrs), Avg:98.3 F (36.8 C), Min:98.3 F (36.8 C), Max:98.3 F (36.8 C)  Recent Labs  Lab 06/29/23 2017 06/29/23 2240  WBC 23.0*  --   CREATININE 1.34*  --   LATICACIDVEN  --  2.1*    Estimated Creatinine Clearance: 33.7 mL/min (A) (by C-G formula based on SCr of 1.34 mg/dL (H)).    Allergies  Allergen Reactions   Codeine Nausea Only and Nausea And Vomiting   Augmentin [Amoxicillin-Pot Clavulanate] Nausea Only    Very nauseated/ feels sore on body    Ceftriaxone Other (See Comments)    "Pins and needles" sensation in legs    Antimicrobials this admission: Meropenem 10/20 >>  Fidaxamicin 10/20 >> (10/29)  Dose adjustments this admission: N/A  Microbiology results: 10/19 BCx: collected 10/19 Ddiff values: Antigen(+), Toxin(-), PCR(+)  Thank you for allowing pharmacy to be a part of this patient's care.  Avant Printy A Merissa Renwick 06/30/2023 12:32 AM

## 2023-06-30 NOTE — ED Notes (Signed)
Patient medicated and given pain meds per request. Patient cleaned of bowel incontinence, new pads and gown placed. VSS, CCM in use, call light within reach.

## 2023-06-30 NOTE — ED Notes (Signed)
Mansy, MD notified of patient's increasingly softer blood pressures with orders obtained and NS bolus given. Patient cleaned of bowel and urinary incontinence with new pads placed underneath. Patient has obvious redness and beginnings of skin break down surrounding perineum area, barrier cream applied.

## 2023-06-30 NOTE — ED Notes (Signed)
Fecal management system placed by this RN.

## 2023-06-30 NOTE — ED Notes (Signed)
Patient cleaned of bowel incontinence, new pads placed, and new gown given. Patient back to resting quietly with eyes closed, VSS, CCM in use. Provided with gingerale at request.

## 2023-06-30 NOTE — ED Notes (Signed)
Pt rectal tube had fallen out. Pt cleaned of stool, new chucks placed. Pt given new rectal tube with this RN and Ship broker. Balloon inflated to 45mL, appears to be functioning correctly.

## 2023-06-30 NOTE — Progress Notes (Signed)
PROGRESS NOTE  Jeanne [name]  DOB: 11/25/1950  PCP: Jerl Mina, MD QMV:[id number]  DOA: 06/29/2023  LOS: 1 day  Hospital Day: 2  Brief narrative: Jeanne [name] is a 72 y.o. female with PMH significant for C. difficile pancolitis, rheumatoid arthritis with deformities, cervical cancer s/p radiation, chronic radiation cystitis, HTN, CKD 3, right hip surgery October 2023 and impaired mobility since then.  Since May this year, patient has had 5 episodes of C. difficile colitis accelerating her overall physical decline.  She does not have chronic diarrhea and is not on any suppressive therapy.  10/18, patient was tested for UTI by her PCP because of dysuria, burning and frequency for few days..  Urinalysis was positive patient was started on a prescription of Levaquin.  However, she had not started it yet.  Next morning, patient had, multiple episodes of diarrhea, vomiting and hence she was brought to the ED.  In the ED, patient was afebrile, heart rate in 110s, blood pressure in low 100s and 90s Initial labs with WBC count 23,000, lactic acid 2.1, BUN/creatinine 28/1.34 [name] with C. difficile antigen positive, toxin negative but toxigenic C. difficile by PCR was positive toxin positive GI pathogen panel negative Urinalysis showed cloudy yellow urine with moderate hemoglobin, large leukocytes and positive nitrite and many bacteria CT abdomen pelvis did not show acute findings and showed chronic findings as below 1. Nonobstructing intrarenal stones on the right. Right parapelvic cysts.  2. Severe left renal atrophy. Hydronephrosis and hydroureter on the left appears progressed since prior study but may be chronic, resulting in the atrophy.  3. No evidence of bowel obstruction or inflammation. 4. Aortic atherosclerosis.  Urine culture and blood culture was sent Patient was started on Dificid for C. difficile as well as IV meropenem for UTI. Admitted to [facility]  Subjective: Patient  was seen and examined this morning.  Pleasant elderly Caucasian female.  Propped up in bed.  Not in distress.  Complaint she has had multiple liquid watery bowel movements overnight.  Blood pressure running low in 80s this morning. Chart reviewed. Overnight, no fever, heart rate 90s, blood pressure low 100s Repeat labs this morning with WBC count 12.3, lactic acid improved to 1.5, BUN/creatinine improved to 26/1.25  Assessment and plan: Severe sepsis POA Due to UTI and C. difficile colitis Met criteria with tachycardia, leukocytosis, lactic acidosis, low blood pressure, AKI Blood culture sent. Antibiotics started.  WBC count improving.  Continue to monitor See below for management of individual issues Recent Labs  Lab 06/29/23 2017 06/29/23 2240 06/30/23 0457  WBC 23.0*  --  12.3*  LATICACIDVEN  --  2.1* 1.5  PROCALCITON  --  0.79  --    UTI H/o MDR Klebsiella oxytoca UTI Per history, diagnosed UTI the day prior to presentation on 10/18 and was planned Levaquin which she had not started yet. Repeat urinalysis on admission was positive Given prior MDR Klebsiella UTI, patient was started on IV meropenem.  Also added fluconazole 100 mg daily for yeast infection. Continue to monitor symptoms  Recurrent C. difficile colitis Reports 5 episode of C. difficile colitis in the last 5 months.  She does not have chronic diarrhea and is not on prophylactic vancomycin. Currently she has been started on Dificid 200 mg bid GI pathogen panel unremarkable Patient reports she has been having multiple liquidy stool overnight.  Discussed with RN.  Ordered Flexi-Seal. Continue to monitor symptoms  Hypotension Blood pressure currently low because of sepsis.  Patient reports  he used to be on midodrine in the past but was discontinued because her blood pressure had improved.  Currently on IV hydration.  AKI on CKD 3b Baseline recent creatinine was 0.84 on 06/01/2023.  Presents creatinine elevated 1.34  with sepsis.  Expect improvement with IV fluid Recent Labs    03/02/23 0453 05/25/23 1610 05/26/23 0932 05/27/23 0636 05/28/23 0724 05/29/23 0556 05/31/23 0808 06/01/23 0340 06/29/23 2017 06/30/23 0457  BUN 17 17 16 14 12 10 21  27* 28* 26*  CREATININE 1.23* 1.03* 1.06* 1.16* 1.11* 1.09* 0.95 0.86 1.34* 1.25*   Chronic obstructive uropathy CT scan on admission showed hydronephrosis and hydroureter on the left appears progressed since prior study but may be chronic, resulting in the atrophy. The distal ureter is not visualized due to streak artifact from hip prostheses and a distal obstructing stone is not excluded.   May need outpatient follow-up with urology if renal function continues to worsen   Arthritis or polyarthritis, rheumatoid Continue home as needed oxycodone Continue gabapentin   Mobility: PT eval to prevent deconditioning  Goals of care   Code Status: Full Code     DVT prophylaxis:  enoxaparin (LOVENOX) injection 40 mg Start: 06/30/23 1000   Antimicrobials: IV meropenem, oral fluconazole and oral Dificid Fluid: D5 NS at 75 mL/h Consultants: None Family Communication: Her daughter was at bedside and was asleep throughout the the conversation.  Patient did not want to bother her.  Status: Inpatient Level of care:  Telemetry Medical   Patient is from: Home Needs to continue in-hospital care: Needs IV antibiotics Anticipated d/c to: Pending clinical course    Diet:  Diet Order             Diet regular Room service appropriate? Yes; Fluid consistency: Thin  Diet effective now                   Scheduled Meds:  acidophilus  1 capsule Oral Daily   enoxaparin (LOVENOX) injection  40 mg Subcutaneous Q24H   fidaxomicin  200 mg Oral BID   fluconazole  100 mg Oral Daily   gabapentin  100 mg Oral QHS    PRN meds: acetaminophen, HYDROmorphone (DILAUDID) injection, ondansetron (ZOFRAN) IV, oxyCODONE   Infusions:   dextrose 5 % and 0.9 % NaCl  Stopped (06/30/23 1028)   meropenem (MERREM) IV Stopped (06/30/23 1015)    Antimicrobials: Anti-infectives (From admission, onward)    Start     Dose/Rate Route Frequency Ordered Stop   06/30/23 1000  fluconazole (DIFLUCAN) tablet 100 mg        100 mg Oral Daily 06/30/23 0144     06/30/23 0100  meropenem (MERREM) 1 g in sodium chloride 0.9 % 100 mL IVPB        1 g 200 mL/hr over 30 Minutes Intravenous Every 12 hours 06/30/23 0032     06/29/23 2330  fidaxomicin (DIFICID) tablet 200 mg        200 mg Oral 2 times daily 06/29/23 2327 07/09/23 2159       Objective: Vitals:   06/30/23 1100 06/30/23 1130  BP: (!) 102/57 (!) 119/47  Pulse: 83 (!) 106  Resp: 11 (!) 23  Temp:    SpO2: 97% 96%    Intake/Output Summary (Last 24 hours) at 06/30/2023 1156 Last data filed at 06/30/2023 0244 Gross per 24 hour  Intake 2100 ml  Output --  Net 2100 ml   Filed Weights   06/29/23 1833  Weight: 62.3  kg   Weight change:  Body mass index is 23.58 kg/m.   Physical Exam: General exam: Pleasant, elderly Caucasian female. Skin: No rashes, lesions or ulcers. HEENT: Atraumatic, normocephalic, no obvious bleeding Lungs: Clear to auscultation bilaterally CVS: Regular rate and rhythm, no murmur GI/Abd soft, nontender, nondistended, bowel sound present CNS: Alert, awake, oriented x 3 Psychiatry: Mood appropriate Extremities: No pedal edema, no calf tenderness  Data Review: I have personally reviewed the laboratory data and studies available.  F/u labs ordered Unresulted Labs (From admission, onward)     Start     Ordered   07/01/23 0500  CBC with Differential/Platelet  Daily,   R      06/30/23 1155   07/01/23 0500  Basic metabolic panel  Daily,   R      06/30/23 1155   07/01/23 0500  Magnesium  Tomorrow morning,   R        06/30/23 1155   07/01/23 0500  Phosphorus  Tomorrow morning,   R        06/30/23 1155   06/30/23 0307  Urine Culture  Once,   R        06/30/23 0307             Admission date and time: 06/29/2023  7:58 PM   Total time spent in review of labs and imaging, patient evaluation, formulation of plan, documentation and communication with family: 55 minutes  Signed, Lorin Glass, MD [facility] 06/30/2023

## 2023-07-01 ENCOUNTER — Encounter: Payer: Self-pay | Admitting: Internal Medicine

## 2023-07-01 ENCOUNTER — Other Ambulatory Visit (HOSPITAL_COMMUNITY): Payer: Self-pay

## 2023-07-01 DIAGNOSIS — A0472 Enterocolitis due to Clostridium difficile, not specified as recurrent: Secondary | ICD-10-CM | POA: Diagnosis not present

## 2023-07-01 LAB — BASIC METABOLIC PANEL
Anion gap: 8 (ref 5–15)
BUN: 19 mg/dL (ref 8–23)
CO2: 21 mmol/L — ABNORMAL LOW (ref 22–32)
Calcium: 8.4 mg/dL — ABNORMAL LOW (ref 8.9–10.3)
Chloride: 110 mmol/L (ref 98–111)
Creatinine, Ser: 1.2 mg/dL — ABNORMAL HIGH (ref 0.44–1.00)
GFR, Estimated: 49 mL/min — ABNORMAL LOW (ref >60)
Glucose, Bld: 127 mg/dL — ABNORMAL HIGH (ref 70–99)
Potassium: 3.8 mmol/L (ref 3.5–5.1)
Sodium: 139 mmol/L (ref 135–145)

## 2023-07-01 LAB — CBC WITH DIFFERENTIAL/PLATELET
Abs Immature Granulocytes: 0.02 10*3/uL (ref 0.00–0.07)
Basophils Absolute: 0 10*3/uL (ref 0.0–0.1)
Basophils Relative: 0 %
Eosinophils Absolute: 0.4 10*3/uL (ref 0.0–0.5)
Eosinophils Relative: 6 %
HCT: 34.5 % — ABNORMAL LOW (ref 36.0–46.0)
Hemoglobin: 10.6 g/dL — ABNORMAL LOW (ref 12.0–15.0)
Immature Granulocytes: 0 %
Lymphocytes Relative: 13 %
Lymphs Abs: 0.8 10*3/uL (ref 0.7–4.0)
MCH: 28.3 pg (ref 26.0–34.0)
MCHC: 30.7 g/dL (ref 30.0–36.0)
MCV: 92 fL (ref 80.0–100.0)
Monocytes Absolute: 0.4 10*3/uL (ref 0.1–1.0)
Monocytes Relative: 6 %
Neutro Abs: 4.6 10*3/uL (ref 1.7–7.7)
Neutrophils Relative %: 75 %
Platelets: 205 10*3/uL (ref 150–400)
RBC: 3.75 MIL/uL — ABNORMAL LOW (ref 3.87–5.11)
RDW: 15.3 % (ref 11.5–15.5)
WBC: 6.1 10*3/uL (ref 4.0–10.5)
nRBC: 0 % (ref 0.0–0.2)

## 2023-07-01 LAB — URINE CULTURE: Culture: <10000 — AB

## 2023-07-01 LAB — PHOSPHORUS: Phosphorus: 2.8 mg/dL (ref 2.5–4.6)

## 2023-07-01 LAB — MAGNESIUM: Magnesium: 1.6 mg/dL — ABNORMAL LOW (ref 1.7–2.4)

## 2023-07-01 MED ORDER — OXYCODONE HCL 5 MG PO TABS
10.0000 mg | ORAL_TABLET | Freq: Four times a day (QID) | ORAL | Status: DC | PRN
  Administered 2023-07-01: 10 mg via ORAL
  Filled 2023-07-01 (×2): qty 2

## 2023-07-01 MED ORDER — CHOLESTYRAMINE LIGHT 4 G PO PACK
4.0000 g | PACK | Freq: Three times a day (TID) | ORAL | Status: AC
  Administered 2023-07-01 – 2023-07-02 (×4): 4 g via ORAL
  Filled 2023-07-01 (×7): qty 1

## 2023-07-01 MED ORDER — GERHARDT'S BUTT CREAM
TOPICAL_CREAM | Freq: Three times a day (TID) | CUTANEOUS | Status: DC
  Administered 2023-07-04: 1 via TOPICAL
  Filled 2023-07-01 (×2): qty 1

## 2023-07-01 MED ORDER — ACETAMINOPHEN 500 MG PO TABS
1000.0000 mg | ORAL_TABLET | Freq: Three times a day (TID) | ORAL | Status: DC
  Administered 2023-07-01 – 2023-07-05 (×10): 1000 mg via ORAL
  Filled 2023-07-01 (×12): qty 2

## 2023-07-01 MED ORDER — OXYCODONE HCL 5 MG PO TABS
5.0000 mg | ORAL_TABLET | Freq: Once | ORAL | Status: AC
  Administered 2023-07-01: 5 mg via ORAL
  Filled 2023-07-01: qty 1

## 2023-07-01 MED ORDER — ZINC OXIDE 40 % EX OINT
TOPICAL_OINTMENT | CUTANEOUS | Status: DC | PRN
  Filled 2023-07-01 (×3): qty 113

## 2023-07-01 NOTE — Progress Notes (Addendum)
PROGRESS NOTE  Jeanne [name]  DOB: Jan 25, 1951  PCP: Jerl Mina, MD ZOX:[id number]  DOA: 06/29/2023  LOS: 2 days  Hospital Day: 3  Brief narrative: Jeanne [name] is a 72 y.o. female with PMH significant for C. difficile pancolitis, rheumatoid arthritis with deformities, cervical cancer s/p radiation, chronic radiation cystitis, HTN, CKD 3, right hip surgery October 2023 and impaired mobility since then.  Since May this year, patient has had 5 episodes of C. difficile colitis accelerating her overall physical decline.  She does not have chronic diarrhea and is not on any suppressive therapy.  10/18, patient was tested for UTI by her PCP because of dysuria, burning and frequency for few days..  Urinalysis was positive patient was started on a prescription of Levaquin.  However, she had not started it yet.  Next morning, patient had, multiple episodes of diarrhea, vomiting and hence she was brought to the ED.  In the ED, patient was afebrile, heart rate in 110s, blood pressure in low 100s and 90s Initial labs with WBC count 23,000, lactic acid 2.1, BUN/creatinine 28/1.34 Jeanne [name] with C. difficile antigen positive, toxin negative but toxigenic C. difficile by PCR was positive toxin positive GI pathogen panel negative Urinalysis showed cloudy yellow urine with moderate hemoglobin, large leukocytes and positive nitrite and many bacteria CT abdomen pelvis did not show acute findings and showed chronic findings as below 1. Nonobstructing intrarenal stones on the right. Right parapelvic cysts.  2. Severe left renal atrophy. Hydronephrosis and hydroureter on the left appears progressed since prior study but may be chronic, resulting in the atrophy.  3. No evidence of bowel obstruction or inflammation. 4. Aortic atherosclerosis.  Urine culture and blood culture was sent Patient was started on Dificid for C. difficile as well as IV meropenem for UTI. Admitted to  [facility]  Subjective: Patient was seen and examined this morning.   Lying on bed.  Tearful in pain.  Husband at bedside.  Patient has rheumatoid arthritis with deformities and is dependent on oxycodone 10 mg 4 times daily.  She has been receiving only 5 mg 4 times daily and hence the pain.   Continues to have diarrhea but states the amount is less and also getting more consistent.   Chart reviewed. WBC count improved.  No fever.  Creatinine improving.  Assessment and plan: Severe sepsis POA Due to UTI and C. difficile colitis Met criteria with tachycardia, leukocytosis, lactic acidosis, low blood pressure, AKI Blood culture has not shown any growth so far. Antibiotics started.  WBC count improving.  Continue to monitor See below for management of individual issues Recent Labs  Lab 06/29/23 2017 06/29/23 2240 06/30/23 0457 07/01/23 0416  WBC 23.0*  --  12.3* 6.1  LATICACIDVEN  --  2.1* 1.5  --   PROCALCITON  --  0.79  --   --    UTI H/o MDR Klebsiella oxytoca UTI Per history, diagnosed UTI the day prior to presentation on 10/18 and was planned Levaquin which she had not started yet. Repeat urinalysis on admission was positive Given prior MDR Klebsiella UTI, patient was started on IV meropenem.  Also added fluconazole 100 mg daily for yeast infection. Urine culture however is showing only less than 10,000 CFU per mL of insignificant growth.  Empiric IV meropenem may be enough Continue to monitor symptoms.  Recurrent C. difficile colitis Reports 5 episode of C. difficile colitis in the last 5 months.  She does not have chronic diarrhea and is not on  prophylactic vancomycin. Currently she has been started on Dificid 200 mg bid. Still has diarrhea but amount less than somewhat solid.  Flexi-Seal was tried but kept on falling.  I would add Questran for next 2 days. GI pathogen panel unremarkable Continue to monitor.  Hypotension Blood pressure currently low because of sepsis.  Patient  reports he used to be on midodrine in the past but was discontinued because her blood pressure had improved.  Currently on IV hydration.  AKI on CKD 3b Baseline recent creatinine was 0.84 on 06/01/2023.  Presented with creatinine elevated 1.34 with sepsis.  Gradually improving with IV fluid. Recent Labs    05/25/23 1610 05/26/23 0932 05/27/23 0636 05/28/23 0724 05/29/23 0556 05/31/23 0808 06/01/23 0340 06/29/23 2017 06/30/23 0457 07/01/23 0416  BUN 17 16 14 12 10 21  27* 28* 26* 19  CREATININE 1.03* 1.06* 1.16* 1.11* 1.09* 0.95 0.86 1.34* 1.25* 1.20*   Chronic obstructive uropathy CT scan on admission showed hydronephrosis and hydroureter on the left appears progressed since prior study but may be chronic, resulting in the atrophy. The distal ureter is not visualized due to streak artifact from hip prostheses and a distal obstructing stone is not excluded.   May need outpatient follow-up with urology if renal function continues to worsen   Arthritis or polyarthritis, rheumatoid Continue home as needed oxycodone 10 mg every 6hrs.  Also added Tylenol scheduled 3 times daily and IV Dilaudid as needed. Continue gabapentin   Mobility: PT eval to prevent deconditioning  Goals of care   Code Status: Full Code     DVT prophylaxis:  enoxaparin (LOVENOX) injection 40 mg Start: 06/30/23 1000   Antimicrobials: IV meropenem, oral fluconazole and oral Dificid Fluid: IV fluid on hold currently.  Encourage oral hydration. Consultants: None Family Communication: Husband at bedside.  Status: Inpatient Level of care:  Telemetry Medical   Patient is from: Home Needs to continue in-hospital care: Needs IV antibiotics.  Continue to monitor symptoms. Anticipated d/c to: Pending clinical course    Diet:  Diet Order             Diet regular Room service appropriate? Yes; Fluid consistency: Thin  Diet effective now                   Scheduled Meds:  acetaminophen  1,000 mg Oral  TID   acidophilus  1 capsule Oral Daily   cholestyramine light  4 g Oral TID   enoxaparin (LOVENOX) injection  40 mg Subcutaneous Q24H   fidaxomicin  200 mg Oral BID   fluconazole  100 mg Oral Daily   gabapentin  100 mg Oral QHS    PRN meds: HYDROmorphone (DILAUDID) injection, ondansetron (ZOFRAN) IV, oxyCODONE   Infusions:   meropenem (MERREM) IV Stopped (07/01/23 1009)    Antimicrobials: Anti-infectives (From admission, onward)    Start     Dose/Rate Route Frequency Ordered Stop   06/30/23 1000  fluconazole (DIFLUCAN) tablet 100 mg        100 mg Oral Daily 06/30/23 0144     06/30/23 0100  meropenem (MERREM) 1 g in sodium chloride 0.9 % 100 mL IVPB        1 g 200 mL/hr over 30 Minutes Intravenous Every 12 hours 06/30/23 0032     06/29/23 2330  fidaxomicin (DIFICID) tablet 200 mg        200 mg Oral 2 times daily 06/29/23 2327 07/09/23 2159       Objective: Vitals:  07/01/23 1000 07/01/23 1100  BP: (!) 114/47 (!) 112/51  Pulse: 95 95  Resp: 18 10  Temp: 98.6 F (37 C)   SpO2: 100% 100%   No intake or output data in the 24 hours ending 07/01/23 1237  Filed Weights   06/29/23 1833  Weight: 62.3 kg   Weight change:  Body mass index is 23.58 kg/m.   Physical Exam: General exam: Pleasant, elderly Caucasian female. Skin: No rashes, lesions or ulcers. HEENT: Atraumatic, normocephalic, no obvious bleeding Lungs: Clear to auscultation bilaterally CVS: Regular rate and rhythm, no murmur GI/Abd soft, mild diffuse tenderness, nondistended, bowel sound present CNS: Alert, awake, oriented x 3 Psychiatry: Was tearful because of pain. Extremities: No pedal edema, no calf tenderness  Data Review: I have personally reviewed the laboratory data and studies available.  F/u labs ordered Unresulted Labs (From admission, onward)     Start     Ordered   07/01/23 0500  CBC with Differential/Platelet  Daily,   R      06/30/23 1155   07/01/23 0500  Basic metabolic panel   Daily,   R      06/30/23 1155            Admission date and time: 06/29/2023  7:58 PM   Total time spent in review of labs and imaging, patient evaluation, formulation of plan, documentation and communication with family: 45 minutes  Signed, Lorin Glass, MD [facility] 07/01/2023

## 2023-07-01 NOTE — Evaluation (Signed)
Physical Therapy Evaluation Patient Details Name: Jeanne [name] MRN: [id number] DOB: May 07, 1951 Today's Date: 07/01/2023  History of Present Illness  72 y/o female presented to ED on 06/29/23 for diarrhea and abdominal pain. Admitted for severe sepsis 2/2 Cdiff. PMH: hx of Cdiff, pancolitis, RA, cervical cancer (s/p radiation therapy), CKD-3b, HTN  Clinical Impression  Patient admitted with the above. PTA, patient lives with husband and is bedbound except for with participating with HHPT 2x/week. She has worked on bed level exercises, sitting EOB, and when able stands with RW and short ambulation but unable to for unknown amount of time due to B ankle pain. Patient complaining of severe pain during evaluation, RN notified and MD arrived during session. Minimal initiation to participate in rolling 2/2 pain and refused attempts at mobility. Able to perform very small range SLR bilaterally but complaining of pain with minimal movement. Patient will benefit from skilled PT services during acute stay to address listed deficits. Patient will benefit from ongoing therapy at discharge to maximize functional independence and safety.         If plan is discharge home, recommend the following: Two people to help with walking and/or transfers;Two people to help with bathing/dressing/bathroom;Assistance with cooking/housework;Assist for transportation;Help with stairs or ramp for entrance;Supervision due to cognitive status   Can travel by private vehicle        Equipment Recommendations Hoyer lift  Recommendations for Other Services       Functional Status Assessment Patient has had a recent decline in their functional status and demonstrates the ability to make significant improvements in function in a reasonable and predictable amount of time.     Precautions / Restrictions Precautions Precautions: Fall Restrictions Weight Bearing Restrictions: No      Mobility  Bed Mobility Overal bed  mobility: Needs Assistance             General bed mobility comments: minimal initiation to participate in rolling 2/2 severe pain. Refused attempts 2/2 pain, notified RN about wanting pain medication    Transfers                        Ambulation/Gait                  Stairs            Wheelchair Mobility     Tilt Bed    Modified Rankin (Stroke Patients Only)       Balance                                             Pertinent Vitals/Pain Pain Assessment Pain Assessment: Faces Faces Pain Scale: Hurts worst Pain Location: "all over" Pain Descriptors / Indicators: Discomfort, Grimacing, Guarding Pain Intervention(s): Limited activity within patient's tolerance, Monitored during session, Patient requesting pain meds-RN notified, Repositioned    Home Living Family/patient expects to be discharged to:: Private residence Living Arrangements: Spouse/significant other Available Help at Discharge: Family;Available 24 hours/day Type of Home: House Home Access: Ramped entrance       Home Layout: One level Home Equipment: Agricultural consultant (2 wheels);Wheelchair - Dentist;Other (comment) Additional Comments: has bilat platforms for RW, has dressing stick    Prior Function Prior Level of Function : Needs assist             Mobility Comments: bedbound  except for when HHPT comes to work with her. Participates in bed level exercises, sitting EOB, and when able she stands and attempts ambulation with PT. Does not get out of bed with family. HHPT comes 2x/week ADLs Comments: Sponge bathes at baseline uses pure wick, primarily stays in bed when not working with PT at home, bed pan for BMs, family provides all meals, meds, daughter helps with LB dressing/bathing pt able to complete UB bathing and dressing seated EOB.     Extremity/Trunk Assessment   Upper Extremity Assessment Upper Extremity  Assessment: Generalized weakness    Lower Extremity Assessment Lower Extremity Assessment: Generalized weakness       Communication   Communication Communication: No apparent difficulties  Cognition Arousal: Alert Behavior During Therapy: WFL for tasks assessed/performed Overall Cognitive Status: Within Functional Limits for tasks assessed                                 General Comments: decreased awareness into situation        General Comments      Exercises     Assessment/Plan    PT Assessment Patient needs continued PT services  PT Problem List Decreased strength;Decreased range of motion;Decreased activity tolerance;Decreased balance;Decreased mobility;Decreased safety awareness;Cardiopulmonary status limiting activity       PT Treatment Interventions DME instruction;Functional mobility training;Therapeutic exercise;Gait training;Therapeutic activities;Balance training;Patient/family education;Wheelchair mobility training    PT Goals (Current goals can be found in the Care Plan section)  Acute Rehab PT Goals Patient Stated Goal: to stand again PT Goal Formulation: With patient/family Time For Goal Achievement: 07/15/23 Potential to Achieve Goals: Poor    Frequency Min 1X/week     Co-evaluation               AM-PAC PT "6 Clicks" Mobility  Outcome Measure Help needed turning from your back to your side while in a flat bed without using bedrails?: Total Help needed moving from lying on your back to sitting on the side of a flat bed without using bedrails?: Total Help needed moving to and from a bed to a chair (including a wheelchair)?: Total Help needed standing up from a chair using your arms (e.g., wheelchair or bedside chair)?: Total Help needed to walk in hospital room?: Total Help needed climbing 3-5 steps with a railing? : Total 6 Click Score: 6    End of Session   Activity Tolerance: Patient limited by pain Patient left: in  bed;with call bell/phone within reach;with family/visitor present Nurse Communication: Mobility status PT Visit Diagnosis: Unsteadiness on feet (R26.81);Muscle weakness (generalized) (M62.81);Other abnormalities of gait and mobility (R26.89)    Time: 2725-3664 PT Time Calculation (min) (ACUTE ONLY): 10 min   Charges:   PT Evaluation $PT Eval Moderate Complexity: 1 Mod   PT General Charges $$ ACUTE PT VISIT: 1 Visit         Maylon Peppers, PT, DPT Physical Therapist - [facility]  [facility], [place]   Abuk Selleck A Alaijah Gibler 07/01/2023, 12:58 PM

## 2023-07-01 NOTE — Progress Notes (Signed)
X1 attempt for PIV with [place] unsuccessful. Both arms assessed with [place]. No other appropriate site found for IV or midline. Recommend considering alternative medication route or CVAD placement. Nurse notified.

## 2023-07-01 NOTE — TOC Benefit Eligibility Note (Signed)
Patient Product/process development scientist completed.    The patient is insured through [employer]. Patient has Medicare and is not eligible for a copay card, but may be able to apply for patient assistance, if available.    Ran test claim for Dificid 200 mg and the current 10 day co-pay is $1,311.00 due to being in Coverage Gap (donut hole).   This test claim was processed through [facility]- copay amounts may vary at other pharmacies due to pharmacy/plan contracts, or as the patient moves through the different stages of their insurance plan.     Roland Earl, CPHT Pharmacy Technician III Certified Patient Advocate [facility] Patient Advocate Team Direct Number: [phone]  Fax: [phone]

## 2023-07-01 NOTE — Plan of Care (Signed)
  Problem: Fluid Volume: Goal: Hemodynamic stability will improve Outcome: Progressing   Problem: Clinical Measurements: Goal: Diagnostic test results will improve Outcome: Progressing Goal: Signs and symptoms of infection will decrease Outcome: Progressing   Problem: Respiratory: Goal: Ability to maintain adequate ventilation will improve Outcome: Progressing   Problem: Education: Goal: Knowledge of General Education information will improve Description: Including pain rating scale, medication(s)/side effects and non-pharmacologic comfort measures Outcome: Progressing   Problem: Clinical Measurements: Goal: Ability to maintain clinical measurements within normal limits will improve Outcome: Progressing Goal: Will remain free from infection Outcome: Progressing Goal: Diagnostic test results will improve Outcome: Progressing Goal: Respiratory complications will improve Outcome: Progressing Goal: Cardiovascular complication will be avoided Outcome: Progressing   Problem: Health Behavior/Discharge Planning: Goal: Ability to manage health-related needs will improve Outcome: Progressing   Problem: Activity: Goal: Risk for activity intolerance will decrease Outcome: Progressing   Problem: Nutrition: Goal: Adequate nutrition will be maintained Outcome: Progressing   Problem: Coping: Goal: Level of anxiety will decrease Outcome: Progressing

## 2023-07-01 NOTE — ED Notes (Signed)
Patient medicated for pain per request. IVF is empty, supply is currently empty and the number for supply is not ringing. Labs drawn. VSS, CCM in use, call light within reach.

## 2023-07-01 NOTE — Consult Note (Signed)
NAME: Jeanne [name]  DOB: [birth year]  MRN: [id number]  Date/Time: 07/01/2023 2:56 PM  REQUESTING PROVIDER: Dr Pola Corn Subjective:  REASON FOR CONSULT: UTI ? Jeanne [name] is a 72 y.o. female with a history of cervical ca s/p hysterectomy, radiation causing radiation cystitis, being treated as UTI many times, rheumatoid arthritis not on any DMARDS, rt hip replacement with PJI treated in 2023 twice  , left TKA , recurrent cdiff presents from home with diarrhea , abdominal pain and vomiting for a few days Pt was recently treated for cdiff in Sept  2024 with fidoxamicin Pt was prescribed levofloxacin on 10/19 for UTI Pt has constant burning and frequency and in the past was treated for OAB with botox bladder injection and also urethral bulking with bulkomid  06/29/23  BP 93/58 !  Temp 98.3 F (36.8 C)  Pulse Rate 112 !  Resp 12  SpO2 99 %  Weight 137 lb 5.6 oz     Latest Reference Range & Units 06/29/23  WBC 4.0 - 10.5 K/uL 23.0 (H)  Hemoglobin 12.0 - 15.0 g/dL 78.2  HCT 95.6 - 21.3 % 45.5  Platelets 150 - 400 K/uL 301  Creatinine 0.44 - 1.00 mg/dL 0.86 (H)   Stool positive for cdiff antigen , but toxin neg, PCP neg   Past Medical History:  Diagnosis Date   Bedbound    since R hip surgery, cannot bear weight on R leg. since 07/02/22. Has not walked since then   Cervical cancer (HCC)    CKD stage 3b, GFR 30-44 ml/min (HCC)    Complication of anesthesia    History of kidney stones    Hypertension    PONV (postoperative nausea and vomiting)    rheumatoidArthritis    RA    Past Surgical History:  Procedure Laterality Date   ABDOMINAL HYSTERECTOMY  09/10/2003   CYSTOGRAM N/A 08/04/2018   Procedure: CYSTOGRAM WITH URETHAL DILATION;  Surgeon: Vanna Scotland, MD;  Location: [facility];  Service: Urology;  Laterality: N/A;   CYSTOSCOPY W/ RETROGRADES Bilateral 08/04/2018   Procedure: CYSTOSCOPY WITH RETROGRADE PYELOGRAM;  Surgeon: Vanna Scotland, MD;  Location: [facility];   Service: Urology;  Laterality: Bilateral;   CYSTOSCOPY/URETEROSCOPY/HOLMIUM LASER/STENT PLACEMENT Left 08/04/2018   Procedure: CYSTOSCOPY/URETEROSCOPY/HOLMIUM LASER/STENT PLACEMENT;  Surgeon: Vanna Scotland, MD;  Location: [facility];  Service: Urology;  Laterality: Left;   JOINT REPLACEMENT     TOTAL HIP ARTHROPLASTY Right 1972   TOTAL HIP ARTHROPLASTY Left 1981   TOTAL KNEE ARTHROPLASTY Right 1990    Social History   Socioeconomic History   Marital status: Married    Spouse name: kim   Number of children: 0   Years of education: Not on file   Highest education level: Some college, no degree  Occupational History   Occupation: disability    Comment: now on SS  Tobacco Use   Smoking status: Former    Current packs/day: 0.00    Types: Cigarettes    Start date: 09/09/1969    Quit date: 09/09/1989    Years since quitting: 33.8   Smokeless tobacco: Never   Tobacco comments:    QUIT IN 1990  Vaping Use   Vaping status: Never Used  Substance and Sexual Activity   Alcohol use: No    Alcohol/week: 0.0 standard drinks of alcohol   Drug use: No   Sexual activity: Not Currently    Birth control/protection: None  Other Topics Concern   Not on file  Social History Narrative  Has 1 adopted daughter    Social Determinants of Health   Financial Resource Strain: Low Risk  (04/24/2022)   Received from [facility], [facility]   Overall Financial Resource Strain (CARDIA)    Difficulty of Paying Living Expenses: Not hard at all  Food Insecurity: No Food Insecurity (07/01/2023)   Hunger Vital Sign    Worried About Running Out of Food in the Last Year: Never true    Ran Out of Food in the Last Year: Never true  Transportation Needs: No Transportation Needs (07/01/2023)   PRAPARE - Administrator, Civil Service (Medical): No    Lack of Transportation (Non-Medical): No  Physical Activity: Inactive (08/10/2020)   Exercise Vital Sign     Days of Exercise per Week: 0 days    Minutes of Exercise per Session: 0 min  Stress: No Stress Concern Present (08/10/2020)   [employer] of Occupational Health - Occupational Stress Questionnaire    Feeling of Stress : Only a little  Social Connections: Moderately Integrated (08/10/2020)   Social Connection and Isolation Panel [NHANES]    Frequency of Communication with Friends and Family: More than three times a week    Frequency of Social Gatherings with Friends and Family: More than three times a week    Attends Religious Services: More than 4 times per year    Active Member of [employer] or Organizations: No    Attends Banker Meetings: Never    Marital Status: Married  Catering manager Violence: Not At Risk (07/01/2023)   Humiliation, Afraid, Rape, and Kick questionnaire    Fear of Current or Ex-Partner: No    Emotionally Abused: No    Physically Abused: No    Sexually Abused: No    Family History  Problem Relation Age of Onset   Breast cancer Mother    Hypertension Father    Cancer Father    Prostate cancer Neg Hx    Kidney cancer Neg Hx    Bladder Cancer Neg Hx    Allergies  Allergen Reactions   Codeine Nausea Only and Nausea And Vomiting   Augmentin [Amoxicillin-Pot Clavulanate] Nausea Only    Very nauseated/ feels sore on body    Ceftriaxone Other (See Comments)    "Pins and needles" sensation in legs   I? Current Facility-Administered Medications  Medication Dose Route Frequency Provider Last Rate Last Admin   acetaminophen (TYLENOL) tablet 1,000 mg  1,000 mg Oral TID Lorin Glass, MD   1,000 mg at 07/01/23 1018   acidophilus (RISAQUAD) capsule 1 capsule  1 capsule Oral Daily Lorretta Harp, MD   1 capsule at 07/01/23 0912   cholestyramine light (PREVALITE) packet 4 g  4 g Oral TID Lorin Glass, MD   4 g at 07/01/23 1157   enoxaparin (LOVENOX) injection 40 mg  40 mg Subcutaneous Q24H Lorretta Harp, MD   40 mg at 07/01/23 0914   fidaxomicin (DIFICID)  tablet 200 mg  200 mg Oral BID Sharman Cheek, MD   200 mg at 07/01/23 0911   fluconazole (DIFLUCAN) tablet 100 mg  100 mg Oral Daily Lorretta Harp, MD   100 mg at 07/01/23 0912   gabapentin (NEURONTIN) capsule 100 mg  100 mg Oral QHS Lorretta Harp, MD   100 mg at 06/30/23 2133   HYDROmorphone (DILAUDID) injection 0.5 mg  0.5 mg Intravenous Q3H PRN Lorretta Harp, MD   0.5 mg at 07/01/23 1020   meropenem (MERREM) 1  g in sodium chloride 0.9 % 100 mL IVPB  1 g Intravenous Q12H Mila Merry A, RPH   Stopped at 07/01/23 1009   ondansetron (ZOFRAN) injection 4 mg  4 mg Intravenous Q8H PRN Lorretta Harp, MD   4 mg at 06/30/23 0136   oxyCODONE (Oxy IR/ROXICODONE) immediate release tablet 10 mg  10 mg Oral Q6H PRN Dahal, Melina Schools, MD       Current Outpatient Medications  Medication Sig Dispense Refill   gabapentin (NEURONTIN) 100 MG capsule Take 100 mg by mouth at bedtime.     loperamide (IMODIUM) 2 MG capsule Take 1 capsule (2 mg total) by mouth every 4 (four) hours as needed for diarrhea or loose stools. 30 capsule 0   oxyCODONE (OXY IR/ROXICODONE) 5 MG immediate release tablet Take 1 tablet (5 mg total) by mouth every 6 (six) hours as needed. 15 tablet 0   levofloxacin (LEVAQUIN) 500 MG tablet Take 500 mg by mouth daily.     midodrine (PROAMATINE) 5 MG tablet Take 5 mg by mouth 3 (three) times daily with meals. (Systolic BP<100 mmhg (Patient not taking: Reported on 06/30/2023)     vancomycin (VANCOCIN) 125 MG capsule Take 125 mg by mouth 4 (four) times daily.     vitamin A 3 MG (10000 UNITS) capsule Take 1 capsule (10,000 Units total) by mouth daily. (Patient not taking: Reported on 06/30/2023) 30 capsule 0   Vitamin D, Ergocalciferol, (DRISDOL) 1.25 MG (50000 UNIT) CAPS capsule Take 1 capsule (50,000 Units total) by mouth every 7 (seven) days. (Patient not taking: Reported on 06/30/2023) 4 capsule 0   zinc sulfate 220 (50 Zn) MG capsule Take 1 capsule (220 mg total) by mouth daily. (Patient not taking: Reported  on 06/30/2023) 30 capsule 0     Abtx:  Anti-infectives (From admission, onward)    Start     Dose/Rate Route Frequency Ordered Stop   06/30/23 1000  fluconazole (DIFLUCAN) tablet 100 mg        100 mg Oral Daily 06/30/23 0144     06/30/23 0100  meropenem (MERREM) 1 g in sodium chloride 0.9 % 100 mL IVPB        1 g 200 mL/hr over 30 Minutes Intravenous Every 12 hours 06/30/23 0032     06/29/23 2330  fidaxomicin (DIFICID) tablet 200 mg        200 mg Oral 2 times daily 06/29/23 2327 07/09/23 2159       REVIEW OF SYSTEMS:  Const: negative fever, negative chills, negative weight loss Eyes: negative diplopia or visual changes, negative eye pain ENT: negative coryza, negative sore throat Resp: negative cough, hemoptysis, dyspnea Cards: negative for chest pain, palpitations, lower extremity edema GU:  frequency, dysuria  GI:  abdominal pain, diarrhea,  Skin: negative for rash and pruritus Heme: negative for easy bruising and gum/nose bleeding WU:[id number] in bed  due to pain rt knee and decreased mobility Neurolo:negative for headaches, dizziness, vertigo, memory problems  Psych: negative for feelings of anxiety, depression  Endocrine: negative for thyroid, diabetes Allergy/Immunology- augmentin nausea Ceftriaxone pins and needles Objective:  VITALS:  BP 94/78   Pulse 88   Temp 98.7 F (37.1 C)   Resp 13   Ht 5\' 4"  (1.626 m)   Wt 62.3 kg   SpO2 96%   BMI 23.58 kg/m   PHYSICAL EXAM:  General: Alert, cooperative, no distress, appears stated age.  Head: Normocephalic, without obvious abnormality, atraumatic. Eyes: Conjunctivae clear, anicteric sclerae. Pupils are equal ENT Nares  normal. No drainage or sinus tenderness. Lips, mucosa, and tongue normal. No Thrush Neck: Supple, symmetrical, no adenopathy, thyroid: non tender no carotid bruit and no JVD. Lungs: Clear to auscultation bilaterally. No Wheezing or Rhonchi. No rales. Heart: Regular rate and rhythm, no murmur, rub or  gallop. Abdomen: Soft, non-tender,not distended. Bowel sounds normal. No masses Extremities: deformity of hands Skin: No rashes or lesions. Or bruising Lymph: Cervical, supraclavicular normal. Neurologic: Grossly non-focal Pertinent Labs Lab Results CBC    Component Value Date/Time   WBC 6.1 07/01/2023 0416   RBC 3.75 (L) 07/01/2023 0416   HGB 10.6 (L) 07/01/2023 0416   HGB 13.9 10/02/2019 1007   HCT 34.5 (L) 07/01/2023 0416   HCT 41.7 10/02/2019 1007   PLT 205 07/01/2023 0416   PLT 250 10/02/2019 1007   MCV 92.0 07/01/2023 0416   MCV 88 10/02/2019 1007   MCH 28.3 07/01/2023 0416   MCHC 30.7 07/01/2023 0416   RDW 15.3 07/01/2023 0416   RDW 13.4 10/02/2019 1007   LYMPHSABS 0.8 07/01/2023 0416   LYMPHSABS 1.5 10/02/2019 1007   MONOABS 0.4 07/01/2023 0416   EOSABS 0.4 07/01/2023 0416   EOSABS 0.3 10/02/2019 1007   BASOSABS 0.0 07/01/2023 0416   BASOSABS 0.1 10/02/2019 1007       Latest Ref Rng & Units 07/01/2023    4:16 AM 06/30/2023    4:57 AM 06/29/2023    8:17 PM  CMP  Glucose 70 - 99 mg/dL 161  096  045   BUN 8 - 23 mg/dL 19  26  28    Creatinine 0.44 - 1.00 mg/dL 4.09  8.11  9.14   Sodium 135 - 145 mmol/L 139  137  137   Potassium 3.5 - 5.1 mmol/L 3.8  4.4  5.1   Chloride 98 - 111 mmol/L 110  106  106   CO2 22 - 32 mmol/L 21  23  19    Calcium 8.9 - 10.3 mg/dL 8.4  8.5  8.9   Total Protein 6.5 - 8.1 g/dL   7.1   Total Bilirubin 0.3 - 1.2 mg/dL   1.0   Alkaline Phos 38 - 126 U/L   165   AST 15 - 41 U/L   23   ALT 0 - 44 U/L   17       Microbiology: Recent Results (from the past 240 hour(s))  C Difficile Quick Screen w PCR reflex     Status: Abnormal   Collection Time: 06/29/23  7:06 PM   Specimen: STOOL  Result Value Ref Range Status   C Diff antigen POSITIVE (A) NEGATIVE Final   C Diff toxin NEGATIVE NEGATIVE Final   C Diff interpretation Results are indeterminate. See PCR results.  Final    Comment: Performed at [facility], [address] [place], [place] [address]  C. Diff by PCR, Reflexed     Status: Abnormal   Collection Time: 06/29/23  7:06 PM  Result Value Ref Range Status   Toxigenic C. Difficile by PCR POSITIVE (A) NEGATIVE Final    Comment: Positive for toxigenic C. difficile with little to no toxin production. Only treat if clinical presentation suggests symptomatic illness. CRITICAL RESULT CALLED TO, READ BACK BY AND VERIFIED WITH: ROSE COBURN RN @ [phone] 06/29/23 [facility] Performed at [facility], [address], [place], [place] [address]   Gastrointestinal Panel by PCR , Stool     Status: None   Collection Time: 06/29/23  7:06 PM  Specimen: Stool  Result Value Ref Range Status   Campylobacter species NOT DETECTED NOT DETECTED Final   Plesimonas shigelloides NOT DETECTED NOT DETECTED Final   Salmonella species NOT DETECTED NOT DETECTED Final   Yersinia enterocolitica NOT DETECTED NOT DETECTED Final   Vibrio species NOT DETECTED NOT DETECTED Final   Vibrio cholerae NOT DETECTED NOT DETECTED Final   Enteroaggregative E coli (EAEC) NOT DETECTED NOT DETECTED Final   Enteropathogenic E coli (EPEC) NOT DETECTED NOT DETECTED Final   Enterotoxigenic E coli (ETEC) NOT DETECTED NOT DETECTED Final   Shiga like toxin producing E coli (STEC) NOT DETECTED NOT DETECTED Final   Shigella/Enteroinvasive E coli (EIEC) NOT DETECTED NOT DETECTED Final   Cryptosporidium NOT DETECTED NOT DETECTED Final   Cyclospora cayetanensis NOT DETECTED NOT DETECTED Final   Entamoeba histolytica NOT DETECTED NOT DETECTED Final   Giardia lamblia NOT DETECTED NOT DETECTED Final   Adenovirus F40/41 NOT DETECTED NOT DETECTED Final   Astrovirus NOT DETECTED NOT DETECTED Final   Norovirus GI/GII NOT DETECTED NOT DETECTED Final   Rotavirus A NOT DETECTED NOT DETECTED Final   Sapovirus (I, II, IV, and V) NOT DETECTED NOT DETECTED Final    Comment: Performed at [facility], [address] [place], [place] [address]  Blood  Culture (routine x 2)     Status: None (Preliminary result)   Collection Time: 06/29/23  9:47 PM   Specimen: BLOOD  Result Value Ref Range Status   Specimen Description BLOOD RIGHT ANTECUBITAL  Final   Special Requests   Final    BOTTLES DRAWN AEROBIC ONLY Blood Culture adequate volume   Culture   Final    NO GROWTH 2 DAYS Performed at [facility], [address] [place], [place] [address]    Report Status PENDING  Incomplete  Blood Culture (routine x 2)     Status: None (Preliminary result)   Collection Time: 06/29/23 10:40 PM   Specimen: BLOOD  Result Value Ref Range Status   Specimen Description BLOOD BLOOD RIGHT HAND  Final   Special Requests   Final    BOTTLES DRAWN AEROBIC ONLY Blood Culture results may not be optimal due to an inadequate volume of blood received in culture bottles   Culture   Final    NO GROWTH 2 DAYS Performed at [facility], [address] [place], [place] [address]    Report Status PENDING  Incomplete  Urine Culture     Status: Abnormal   Collection Time: 06/30/23  3:07 AM   Specimen: Urine, Random  Result Value Ref Range Status   Specimen Description   Final    URINE, RANDOM Performed at [facility], [address], [place], [place] [address]    Special Requests   Final    NONE Reflexed from [id number] Performed at [facility], [address] [place], [place] [address]    Culture (A)  Final    <10,000 COLONIES/mL INSIGNIFICANT GROWTH Performed at [facility], [address] [place], [place] [address]    Report Status 07/01/2023 FINAL  Final    IMAGING RESULTS: CT abdomen  Nonobstructing intrarenal stones on the right. Right parapelvic cysts. No imaging follow-up is indicated. 2. Severe left renal atrophy. Hydronephrosis and hydroureter on the left appears progressed since prior study but may be chronic, resulting in the atrophy. The distal ureter is not visualized due to streak artifact  from hip prostheses and a distal obstructing stone is not  excluded. 3. No evidence of bowel obstruction or inflammation. I have personally reviewed the films ? Impression/Recommendation Diarrhea and abdominal pain due to cdiff Was recently treated in sept but has received antibiotic after that for dysuria/UTI Will need  vanco taper  Dysuria, frequency chronic is due to many reasons- Genitourinary syndrome of menopause, radiation cystitis, overactive bladder This is unlikely UTI - bacteria in the urine culture recently is more likely contamination with stool- Discontinue Meropenem  Pt would benefit from the following ?*Estrace /Premarin cream topically- peasized apply topically three times a week * Cetaphil to clean the genital area ( not soap)  * Cranberry supplement (-Knudsen cranberry concentrate- 1 ounce mixed with 8 ounces of water *wash with water after bowel movt *Probiotic for vaginal health( can try Pearls vaginal health) * Increase water consumption- 8 glasses a day *not to check  urine on a routine basis  *Avoid antibiotics unless systemic infection/ or before cystoscopy * Kegel Exercise to strengthen pelvic floor ? ?  H/o cervical ca s/p hysterectomy , radiation  Rt hip PJI- s/p 2 stage procedure and had two courses of 6 weeks Iv antibiotic  H/o left THA Left TKA  Rheumatoid arthritis - not on DMARDS  _ ________________________________________________ Discussed with patient and  requesting provider   Note:  This document was prepared using Dragon voice recognition software and may include unintentional dictation errors.

## 2023-07-01 NOTE — ED Notes (Signed)
Patient cleaned of bowel incontinence. New pads placed. Barrier cream applied due to redness and irritation. VSS, CCM in use, call light within reach. Patient provided with saltines and PB crackers at request.

## 2023-07-01 NOTE — ED Notes (Signed)
Brief changed and peri care provided by Faith NT and [place] NT. Denies other needs currently

## 2023-07-01 NOTE — TOC Initial Note (Signed)
Transition of Care [facility]) - Initial/Assessment Note    Patient Details  Name: Jeanne [name] MRN: [id number] Date of Birth: 1951/02/08  [facility]) CM/SW Contact:    Marquita Palms, LCSW Phone Number: 07/01/2023, 10:31 AM  Clinical Narrative:                  CSW met with patient and husband at bedside. [facility] that [facility] [address] in [place]. She has DME equipment inher home; b\BSC, walker and a scooter. Patient will be transported home with husband upon discharge. Patient will reach out if she needs anything from CSW. No other needs at this time.       Patient Goals and CMS Choice            Expected Discharge Plan and Services                                              Prior Living Arrangements/Services                       Activities of Daily Living      Permission Sought/Granted                  Emotional Assessment              Admission diagnosis:  C. difficile colitis [A04.72] Patient Active Problem List   Diagnosis Date Noted   Severe sepsis (HCC) 06/30/2023   C. 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) 02/27/2023   Elevated alkaline phosphatase level 02/11/2023   SIRS (systemic inflammatory response syndrome) (HCC) 02/11/2023   Diarrhea of presumed infectious origin 02/11/2023   Colitis due to Campylobacter species 02/10/2023   Asymptomatic bacteriuria 02/10/2023   Difficulty walking 02/10/2023   Hypomagnesemia 01/08/2023   Hypokalemia 01/08/2023   CKD stage 3b, GFR 30-44 ml/min (HCC) 01/03/2023   Hypotension 01/02/2023   Pancolitis (HCC) 12/29/2022   Shock circulatory (HCC) 12/28/2022   Severe sepsis with septic shock (HCC) 11/10/2022   Nausea vomiting and diarrhea 11/09/2022   Clostridium difficile diarrhea 11/09/2022   COVID-19 virus infection 11/09/2022   AKI (acute kidney injury) (HCC) 11/09/2022   Anemia 11/09/2022    Fungal infection of skin 09/23/2019   UTI (urinary tract infection) 07/15/2017   Dysuria 01/28/2017   Chronic radiation cystitis 02/28/2015   Coitalgia 02/28/2015   Blood pressure elevated 02/28/2015   Calculus of kidney 02/28/2015   Arthritis or polyarthritis, rheumatoid (HCC) 02/28/2015   Basal cell papilloma 02/28/2015   H/O cataract extraction 09/22/2014   Combined form of senile cataract 07/07/2014   NS (nuclear sclerosis) 05/21/2014   History of repair of hip joint 03/16/2013   H/O total knee replacement 03/16/2013   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 [place] [address] Phone: [phone] Fax: [phone]  [facility] [address] [place] [address] Phone: [phone] Fax: [phone]     Social Determinants of Health (SDOH) Social History: SDOH Screenings   Food Insecurity: No Food Insecurity (05/27/2023)  Housing: Low Risk  (05/27/2023)  Transportation Needs: No Transportation Needs (05/27/2023)  Utilities: Not At Risk (05/27/2023)  Alcohol Screen:  Low Risk  (08/10/2020)  Depression (PHQ2-9): Low Risk  (08/10/2020)  Financial Resource Strain: Low Risk  (04/24/2022)   Received from [facility], [facility]  Physical Activity: Inactive (08/10/2020)  Social Connections: Moderately Integrated (08/10/2020)  Stress: No Stress Concern Present (08/10/2020)  Tobacco Use: Medium Risk (05/27/2023)   SDOH Interventions:     Readmission Risk Interventions    07/01/2023   10:31 AM 03/01/2023   10:56 AM 01/02/2023   12:24 PM  Readmission Risk Prevention Plan  Transportation Screening Complete Complete Complete  PCP or Specialist Appt within 3-5 Days  Complete Complete  HRI or Home Care Consult  Complete   Social Work Consult for Recovery Care  Planning/Counseling  Complete Complete  Palliative Care Screening  Not Applicable Not Applicable  Medication Review Oceanographer)  Complete Complete  PCP or Specialist appointment within 3-5 days of discharge Complete    HRI or Home Care Consult Complete    SW Recovery Care/Counseling Consult Complete    Palliative Care Screening Not Applicable    Skilled Nursing Facility Not Applicable

## 2023-07-01 NOTE — ED Notes (Signed)
Pharmacy messaged for missing am med

## 2023-07-01 NOTE — ED Notes (Signed)
Pt brief and chux changed by this RN and Gabby NT. Peri care provided. Repositioned in bed. Call bell in reach.

## 2023-07-01 NOTE — ED Notes (Signed)
Patient cleaned of bowel incontinence. New pads placed. Barrier cream applied. Patient provided with gingerale at request. VSS, CCM in use, call light within reach.

## 2023-07-02 DIAGNOSIS — A0472 Enterocolitis due to Clostridium difficile, not specified as recurrent: Secondary | ICD-10-CM | POA: Diagnosis not present

## 2023-07-02 LAB — BASIC METABOLIC PANEL
Anion gap: 7 (ref 5–15)
BUN: 17 mg/dL (ref 8–23)
CO2: 21 mmol/L — ABNORMAL LOW (ref 22–32)
Calcium: 8.1 mg/dL — ABNORMAL LOW (ref 8.9–10.3)
Chloride: 108 mmol/L (ref 98–111)
Creatinine, Ser: 1.19 mg/dL — ABNORMAL HIGH (ref 0.44–1.00)
GFR, Estimated: 49 mL/min — ABNORMAL LOW (ref >60)
Glucose, Bld: 105 mg/dL — ABNORMAL HIGH (ref 70–99)
Potassium: 3.8 mmol/L (ref 3.5–5.1)
Sodium: 136 mmol/L (ref 135–145)

## 2023-07-02 LAB — CBC WITH DIFFERENTIAL/PLATELET
Abs Immature Granulocytes: 0.01 10*3/uL (ref 0.00–0.07)
Basophils Absolute: 0 10*3/uL (ref 0.0–0.1)
Basophils Relative: 0 %
Eosinophils Absolute: 0.3 10*3/uL (ref 0.0–0.5)
Eosinophils Relative: 6 %
HCT: 33.4 % — ABNORMAL LOW (ref 36.0–46.0)
Hemoglobin: 10.4 g/dL — ABNORMAL LOW (ref 12.0–15.0)
Immature Granulocytes: 0 %
Lymphocytes Relative: 16 %
Lymphs Abs: 0.7 10*3/uL (ref 0.7–4.0)
MCH: 28.7 pg (ref 26.0–34.0)
MCHC: 31.1 g/dL (ref 30.0–36.0)
MCV: 92.3 fL (ref 80.0–100.0)
Monocytes Absolute: 0.3 10*3/uL (ref 0.1–1.0)
Monocytes Relative: 7 %
Neutro Abs: 3.2 10*3/uL (ref 1.7–7.7)
Neutrophils Relative %: 71 %
Platelets: 194 10*3/uL (ref 150–400)
RBC: 3.62 MIL/uL — ABNORMAL LOW (ref 3.87–5.11)
RDW: 14.9 % (ref 11.5–15.5)
WBC: 4.5 10*3/uL (ref 4.0–10.5)
nRBC: 0 % (ref 0.0–0.2)

## 2023-07-02 MED ORDER — HYDROMORPHONE HCL 1 MG/ML IJ SOLN: 0.5000 mg | INTRAMUSCULAR | Status: DC | PRN

## 2023-07-02 MED ORDER — VANCOMYCIN HCL 125 MG PO CAPS: 125.0000 mg | ORAL_CAPSULE | ORAL | Status: DC

## 2023-07-02 MED ORDER — OXYCODONE HCL 5 MG PO TABS
5.0000 mg | ORAL_TABLET | ORAL | Status: DC | PRN
  Administered 2023-07-02 – 2023-07-04 (×5): 10 mg via ORAL
  Administered 2023-07-05: 5 mg via ORAL
  Administered 2023-07-05: 10 mg via ORAL
  Filled 2023-07-02 (×7): qty 2

## 2023-07-02 MED ORDER — HYDROMORPHONE HCL 1 MG/ML IJ SOLN
0.5000 mg | INTRAMUSCULAR | Status: DC | PRN
  Administered 2023-07-02: 0.5 mg via INTRAVENOUS
  Filled 2023-07-02: qty 0.5

## 2023-07-02 MED ORDER — PROCHLORPERAZINE EDISYLATE 10 MG/2ML IJ SOLN
10.0000 mg | Freq: Four times a day (QID) | INTRAMUSCULAR | Status: DC | PRN
  Administered 2023-07-02 – 2023-07-03 (×2): 10 mg via INTRAVENOUS
  Filled 2023-07-02 (×4): qty 2

## 2023-07-02 MED ORDER — VANCOMYCIN HCL 125 MG PO CAPS: 125.0000 mg | ORAL_CAPSULE | Freq: Two times a day (BID) | ORAL | Status: DC

## 2023-07-02 MED ORDER — VANCOMYCIN HCL 125 MG PO CAPS: 125.0000 mg | ORAL_CAPSULE | Freq: Every day | ORAL | Status: DC

## 2023-07-02 MED ORDER — ESTROGENS CONJUGATED 0.625 MG/GM VA CREA
1.0000 | TOPICAL_CREAM | VAGINAL | Status: DC
  Administered 2023-07-03 – 2023-07-05 (×2): 1 via VAGINAL
  Filled 2023-07-02 (×2): qty 30

## 2023-07-02 MED ORDER — ONDANSETRON HCL 4 MG PO TABS
4.0000 mg | ORAL_TABLET | ORAL | Status: DC | PRN
  Administered 2023-07-02: 4 mg via ORAL
  Filled 2023-07-02: qty 1

## 2023-07-02 MED ORDER — ONDANSETRON HCL 4 MG/2ML IJ SOLN
4.0000 mg | INTRAMUSCULAR | Status: DC | PRN
  Administered 2023-07-02 – 2023-07-03 (×2): 4 mg via INTRAVENOUS
  Filled 2023-07-02 (×2): qty 2

## 2023-07-02 MED ORDER — VANCOMYCIN HCL 125 MG PO CAPS
125.0000 mg | ORAL_CAPSULE | Freq: Four times a day (QID) | ORAL | Status: DC
  Administered 2023-07-02 – 2023-07-05 (×13): 125 mg via ORAL
  Filled 2023-07-02 (×14): qty 1

## 2023-07-02 MED ORDER — MAGNESIUM SULFATE 2 GM/50ML IV SOLN
2.0000 g | Freq: Once | INTRAVENOUS | Status: AC
  Administered 2023-07-02: 2 g via INTRAVENOUS
  Filled 2023-07-02: qty 50

## 2023-07-02 NOTE — Progress Notes (Addendum)
PROGRESS NOTE  Jeanne [name]  DOB: [birth year]  PCP: Jerl Mina, MD QMV:[id number]  DOA: 06/29/2023  LOS: 3 days  Hospital Day: 4  Brief narrative: Jeanne [name] is a 72 y.o. female with PMH significant for C. difficile pancolitis, rheumatoid arthritis with deformities, cervical cancer s/p radiation, chronic radiation cystitis, HTN, CKD 3, right hip surgery October 2023 and impaired mobility since then.  Since May this year, patient has had 5 episodes of C. difficile colitis accelerating her overall physical decline.  She does not have chronic diarrhea and is not on any suppressive therapy.  10/18, patient was tested for UTI by her PCP because of dysuria, burning and frequency for few days..  Urinalysis was positive patient was started on a prescription of Levaquin.  However, she had not started it yet.  Next morning, patient had, multiple episodes of diarrhea, vomiting and hence she was brought to the ED.  In the ED, patient was afebrile, heart rate in 110s, blood pressure in low 100s and 90s Initial labs with WBC count 23,000, lactic acid 2.1, BUN/creatinine 28/1.34 Jeanne [name] with C. difficile antigen positive, toxin negative but toxigenic C. difficile by PCR was positive toxin positive GI pathogen panel negative Urinalysis showed cloudy yellow urine with moderate hemoglobin, large leukocytes and positive nitrite and many bacteria CT abdomen pelvis did not show acute findings and showed chronic findings as below 1. Nonobstructing intrarenal stones on the right. Right parapelvic cysts.  2. Severe left renal atrophy. Hydronephrosis and hydroureter on the left appears progressed since prior study but may be chronic, resulting in the atrophy.  3. No evidence of bowel obstruction or inflammation. 4. Aortic atherosclerosis.  Urine culture and blood culture was sent Patient was started on Dificid for C. difficile as well as IV meropenem for UTI. Admitted to [facility]  Further hospital  course and management as outlined below.   Subjective: Patient was seen and examined this morning awake resting in bed. She reports ongoing diarrhea, now less watery, slightly mushy consistency today. No N/V or abdominal pain.  Assessment and plan: Severe sepsis POA Due to C. difficile colitis Met criteria with tachycardia, leukocytosis, lactic acidosis, low blood pressure, AKI Blood culture has not shown any growth so far. Antibiotics started.  WBC normalized.  Continue to monitor  Recent Labs  Lab 06/29/23 2017 06/29/23 2240 06/30/23 0457 07/01/23 0416 07/02/23 0425  WBC 23.0*  --  12.3* 6.1 4.5  LATICACIDVEN  --  2.1* 1.5  --   --   PROCALCITON  --  0.79  --   --   --    UTI - ruled out H/o MDR Klebsiella oxytoca UTI Per history, diagnosed UTI the day prior to presentation on 10/18 and was planned Levaquin which she had not started yet.  Repeat urinalysis on admission was positive. Given prior MDR Klebsiella UTI, patient was started on IV meropenem.   Also added fluconazole 100 mg daily for yeast infection. Urine culture however is showing only less than 10,000 CFU per mL of insignificant growth.  Empiric ID stopped IV meropenem - doubt UTI  --started intra-vaginal estrogen cream for dysuria that might be from atrophic vaginitis in post-menopausal female  --ID also recommends:  * Cetaphil to clean the genital area ( not soap)  * Cranberry supplement (-Knudsen cranberry concentrate- 1 ounce mixed with 8 ounces of water *wash with water after bowel movt *Probiotic for vaginal health( can try Pearls vaginal health) * Increase water consumption- 8 glasses a day *not  to check  urine on a routine basis  *Avoid antibiotics unless systemic infection/ or before cystoscopy * Kegel Exercise to strengthen pelvic floor  Recurrent C. difficile colitis Reports 5 episode of C. difficile colitis in the last 5 months.   She does not have chronic diarrhea and was not on prophylactic  vancomycin. Initially started on Dificid 200 mg bid. Has ongoing watery diarrhea, becoming more 'mushy' consistency today.   Flexi-Seal was tried but kept on falling Questran for next 2 days was added GI pathogen panel unremarkable --ID consulted --Now on long PO Vancomycin taper --Enteric precautions --Monitor volume status, BP, electrolytes  Hypomagnesemia - replacing 2 g IV Mg-sulfate for Mg 1.6 --Repeat Mg level in AM  Hypotension Blood pressure was low because of sepsis and hypvolemia.  BP's now stable.   Patient reports used to be on midodrine but was discontinued because her BP had improved.   --Monitor BP and maintain MAP > 65  AKI on CKD 3b Baseline recent creatinine was 0.84 on 06/01/2023.  Presented with creatinine elevated 1.34 with sepsis.  Improved with IV fluid. --Monitor off fluids and encourage PO hydration Recent Labs    05/26/23 0932 05/27/23 0636 05/28/23 0724 05/29/23 0556 05/31/23 0808 06/01/23 0340 06/29/23 2017 06/30/23 0457 07/01/23 0416 07/02/23 0425  BUN 16 14 12 10 21  27* 28* 26* 19 17  CREATININE 1.06* 1.16* 1.11* 1.09* 0.95 0.86 1.34* 1.25* 1.20* 1.19*   Chronic obstructive uropathy CT scan on admission showed hydronephrosis and hydroureter on the left appears progressed since prior study but may be chronic, resulting in the atrophy. The distal ureter is not visualized due to streak artifact from hip prostheses and a distal obstructing stone is not excluded.   --May need outpatient follow-up with urology   Arthritis or polyarthritis, rheumatoid --Continue home as needed oxycodone 10 mg every 6hrs.   --Also added Tylenol scheduled 3 times daily and IV Dilaudid as needed. --Continue gabapentin   Mobility: PT eval to prevent deconditioning  Goals of care   Code Status: Full Code     DVT prophylaxis:  enoxaparin (LOVENOX) injection 40 mg Start: 06/30/23 1000   Antimicrobials: oral fluconazole and oral vancomycin taper (Merrem d/c'd  per ID)   Consultants: Infectious Disease  Family Communication: None present on rounds, will attempt to call as time allows.  Status: Inpatient Level of care:  Telemetry Medical   Patient is from: Home  Needs to continue in-hospital care: Ongoing watery diarrhea needs further improvement prior to d/c to prevent readmission for hypovolemia / dehydration or electrolyte derangements.  Anticipated d/c to: Pending clinical course    Diet:  Diet Order             Diet regular Room service appropriate? Yes; Fluid consistency: Thin  Diet effective now                   Scheduled Meds:  acetaminophen  1,000 mg Oral TID   acidophilus  1 capsule Oral Daily   cholestyramine light  4 g Oral TID   [START ON 07/03/2023] conjugated estrogens  1 Applicatorful Vaginal Once per day on Monday Wednesday Friday   enoxaparin (LOVENOX) injection  40 mg Subcutaneous Q24H   fluconazole  100 mg Oral Daily   gabapentin  100 mg Oral QHS   Gerhardt's butt cream   Topical TID   vancomycin  125 mg Oral QID   Followed by   Melene Muller ON 07/16/2023] vancomycin  125 mg Oral BID  Followed by   Melene Muller ON 07/24/2023] vancomycin  125 mg Oral Daily   Followed by   Melene Muller ON 07/31/2023] vancomycin  125 mg Oral QODAY   Followed by   Melene Muller ON 08/28/2023] vancomycin  125 mg Oral Q3 days    PRN meds: HYDROmorphone (DILAUDID) injection, liver oil-zinc oxide, ondansetron (ZOFRAN) IV, ondansetron, oxyCODONE, prochlorperazine   Infusions:     Antimicrobials: Anti-infectives (From admission, onward)    Start     Dose/Rate Route Frequency Ordered Stop   08/28/23 1000  vancomycin (VANCOCIN) capsule 125 mg       Placed in "Followed by" Linked Group   125 mg Oral Every 3 DAYS 07/02/23 1051 09/27/23 0959   07/31/23 1000  vancomycin (VANCOCIN) capsule 125 mg       Placed in "Followed by" Linked Group   125 mg Oral Every other day 07/02/23 1051 08/28/23 0959   07/24/23 1000  vancomycin (VANCOCIN) capsule  125 mg       Placed in "Followed by" Linked Group   125 mg Oral Daily 07/02/23 1051 07/31/23 0959   07/16/23 2200  vancomycin (VANCOCIN) capsule 125 mg       Placed in "Followed by" Linked Group   125 mg Oral 2 times daily 07/02/23 1051 07/23/23 2159   07/02/23 1400  vancomycin (VANCOCIN) capsule 125 mg       Placed in "Followed by" Linked Group   125 mg Oral 4 times daily 07/02/23 1051 07/16/23 1359   06/30/23 1000  fluconazole (DIFLUCAN) tablet 100 mg        100 mg Oral Daily 06/30/23 0144     06/30/23 0100  meropenem (MERREM) 1 g in sodium chloride 0.9 % 100 mL IVPB  Status:  Discontinued        1 g 200 mL/hr over 30 Minutes Intravenous Every 12 hours 06/30/23 0032 07/01/23 1600   06/29/23 2330  fidaxomicin (DIFICID) tablet 200 mg  Status:  Discontinued        200 mg Oral 2 times daily 06/29/23 2327 07/02/23 1051       Objective: Vitals:   07/02/23 0818 07/02/23 1526  BP: 121/62 (!) 132/58  Pulse: 71 74  Resp: 17 17  Temp: 98 F (36.7 C) 98.5 F (36.9 C)  SpO2: 99% 100%    Intake/Output Summary (Last 24 hours) at 07/02/2023 1634 Last data filed at 07/01/2023 1906 Gross per 24 hour  Intake 120 ml  Output --  Net 120 ml    Filed Weights   06/29/23 1833  Weight: 62.3 kg   Weight change:  Body mass index is 23.58 kg/m.   Physical Exam:  General exam: awake, alert, no acute distress HEENT: moist mucus membranes, hearing grossly normal  Respiratory system: CTAB no wheezes, rales or rhonchi, normal respiratory effort. Cardiovascular system: normal S1/S2, RRR, no pedal edema.   Gastrointestinal system: soft, NT, ND, no HSM felt, +bowel sounds. Central nervous system: A&O x 3. no gross focal neurologic deficits, normal speech Extremities: moves all, no edema, normal tone Skin: dry, intact, normal temperaturePsychiatry: normal mood, congruent affect, judgement and insight appear normal   Data Review: I have personally reviewed the laboratory data and studies  available.  F/u labs ordered Unresulted Labs (From admission, onward)     Start     Ordered   07/01/23 0500  CBC with Differential/Platelet  Daily,   R      06/30/23 1155   07/01/23 0500  Basic metabolic panel  Daily,  R      06/30/23 1155            Admission date and time: 06/29/2023  7:58 PM   Total time spent in review of labs and imaging, patient evaluation, formulation of plan, documentation and communication with family: 45 minutes  Signed, Pennie Banter, DO [facility] 07/02/2023

## 2023-07-02 NOTE — Plan of Care (Signed)

## 2023-07-02 NOTE — Evaluation (Signed)
Occupational Therapy Evaluation Patient Details Name: Jeanne [name] MRN: [id number] DOB: Jun 11, 1951 Today's Date: 07/02/2023   History of Present Illness 72 y/o female presented to ED on 06/29/23 for diarrhea and abdominal pain. Admitted for severe sepsis 2/2 Cdiff. PMH: hx of Cdiff, pancolitis, RA, cervical cancer (s/p radiation therapy), CKD-3b, HTN   Clinical Impression   Chart reviewed, pt greeted in bed, alert and oriented x4, agreeable to OT evaluation. PTA pt reports she largely stays in her bed, will sit on the edge of the bed to perform exercises, transfers from bed<>chair via step pivot with RW. Pt husband/daughter assist with dressing, bathing, toileting; SET UP for grooming/feeding tasks. Pt is limited by pain on this date and uncontrolled BM with any attempted mobility. TOTAL A for peri care with pt in significant pain due to break down. MOD-MAX A for rolling multiple attempts with use of bed rail. MOD A for UB dressing. Pt will benefit from post acute OT to address deficits and to facilitate optimal ADL participation. OT will follow acutely.       If plan is discharge home, recommend the following: A lot of help with walking and/or transfers;A lot of help with bathing/dressing/bathroom;Assistance with cooking/housework;Assist for transportation;Help with stairs or ramp for entrance;Direct supervision/assist for financial management    Functional Status Assessment  Patient has had a recent decline in their functional status and demonstrates the ability to make significant improvements in function in a reasonable and predictable amount of time.  Equipment Recommendations  Hoyer lift    Recommendations for Other Services       Precautions / Restrictions Precautions Precautions: Fall Restrictions Weight Bearing Restrictions: No      Mobility Bed Mobility Overal bed mobility: Needs Assistance Bed Mobility: Rolling Rolling: Mod assist, Max assist, +2 for physical  assistance, Used rails         General bed mobility comments: continues to be limited by pain    Transfers                   General transfer comment: deferred on this date      Balance                                           ADL either performed or assessed with clinical judgement   ADL Overall ADL's : Needs assistance/impaired Eating/Feeding: Set up;Bed level   Grooming: Set up;Bed level       Lower Body Bathing: Total assistance;Bed level   Upper Body Dressing : Minimal assistance;Bed level Upper Body Dressing Details (indicate cue type and reason): doff/donn gown Lower Body Dressing: Maximal assistance;Bed level     Toilet Transfer Details (indicate cue type and reason): pt unable to tolerate on this date Toileting- Architect and Hygiene: Total assistance;Bed level Toileting - Clothing Manipulation Details (indicate cue type and reason): after loose BM       General ADL Comments: pt limited by loose BM, pain with mobility despite pre medicated on this date     Vision Patient Visual Report: No change from baseline       Perception         Praxis         Pertinent Vitals/Pain Pain Assessment Pain Assessment: Faces Faces Pain Scale: Hurts worst Pain Location: peri are Pain Descriptors / Indicators: Discomfort, Crying, Grimacing Pain Intervention(s): Monitored during session, Limited  activity within patient's tolerance, Premedicated before session, Repositioned     Extremity/Trunk Assessment Upper Extremity Assessment Upper Extremity Assessment: Generalized weakness (ulnar drift B hands, weakend grip strength; has RA)   Lower Extremity Assessment Lower Extremity Assessment: Generalized weakness       Communication Communication Communication: No apparent difficulties Cueing Techniques: Verbal cues;Tactile cues   Cognition Arousal: Alert Behavior During Therapy: WFL for tasks assessed/performed Overall  Cognitive Status: Within Functional Limits for tasks assessed                                       General Comments  peri area with redness, pain with wiping/touch after    Exercises Other Exercises Other Exercises: edu re: role of OT, role of rehab, discharge recommendations, DME for safe mobility/ADL completion at home   Shoulder Instructions      Home Living Family/patient expects to be discharged to:: Private residence Living Arrangements: Spouse/significant other (daugther is in college and assists) Available Help at Discharge: Family;Available 24 hours/day Type of Home: House Home Access: Ramped entrance     Home Layout: One level     Bathroom Shower/Tub: Walk-in shower;Tub/shower unit         Home Equipment: Agricultural consultant (2 wheels);Wheelchair - Dentist;Other (comment)   Additional Comments: has bilat platforms for RW, has dressing stick      Prior Functioning/Environment Prior Level of Function : Needs assist             Mobility Comments: in bed, will sit on edge of bed for ADLs/exercise; attempts tranfsers with RW, step pivot to chair with HHPT. Pt reports she last tranfserred to chair last week. ADLs Comments: assist for bathing (sponge baths), dressing, toileting (pure wik), all IADLs at this time        OT Problem List: Decreased strength;Decreased activity tolerance;Decreased knowledge of use of DME or AE;Impaired balance (sitting and/or standing);Impaired UE functional use;Pain      OT Treatment/Interventions: Self-care/ADL training;Therapeutic exercise;Energy conservation;DME and/or AE instruction;Therapeutic activities;Patient/family education;Balance training    OT Goals(Current goals can be found in the care plan section) Acute Rehab OT Goals Patient Stated Goal: go home with rehab OT Goal Formulation: With patient Time For Goal Achievement: 07/16/23 Potential to Achieve Goals: Fair ADL  Goals Pt Will Perform Grooming: with set-up;sitting Pt Will Perform Upper Body Dressing: with min assist;sitting Pt Will Perform Lower Body Dressing: with mod assist;sitting/lateral leans Pt Will Transfer to Toilet: with mod assist Pt Will Perform Toileting - Clothing Manipulation and hygiene: with mod assist;sitting/lateral leans  OT Frequency: Min 1X/week    Co-evaluation              AM-PAC OT "6 Clicks" Daily Activity     Outcome Measure Help from another person eating meals?: None Help from another person taking care of personal grooming?: None Help from another person toileting, which includes using toliet, bedpan, or urinal?: Total Help from another person bathing (including washing, rinsing, drying)?: A Lot Help from another person to put on and taking off regular upper body clothing?: A Little Help from another person to put on and taking off regular lower body clothing?: Total 6 Click Score: 15   End of Session Nurse Communication: Mobility status  Activity Tolerance: Patient limited by pain Patient left: in bed;with call bell/phone within reach;with bed alarm set  OT Visit Diagnosis: Other abnormalities of gait and mobility (  R26.89);Muscle weakness (generalized) (M62.81)                Time: 9563-8756 OT Time Calculation (min): 22 min Charges:  OT General Charges $OT Visit: 1 Visit OT Evaluation $OT Eval Moderate Complexity: 1 Mod  Oleta Mouse, OTD OTR/L  07/02/23, 11:26 AM

## 2023-07-03 DIAGNOSIS — A0472 Enterocolitis due to Clostridium difficile, not specified as recurrent: Secondary | ICD-10-CM | POA: Diagnosis not present

## 2023-07-03 LAB — CBC WITH DIFFERENTIAL/PLATELET
Abs Immature Granulocytes: 0.03 10*3/uL (ref 0.00–0.07)
Basophils Absolute: 0 10*3/uL (ref 0.0–0.1)
Basophils Relative: 1 %
Eosinophils Absolute: 0.3 10*3/uL (ref 0.0–0.5)
Eosinophils Relative: 7 %
HCT: 36.5 % (ref 36.0–46.0)
Hemoglobin: 11.6 g/dL — ABNORMAL LOW (ref 12.0–15.0)
Immature Granulocytes: 1 %
Lymphocytes Relative: 29 %
Lymphs Abs: 1.3 10*3/uL (ref 0.7–4.0)
MCH: 28.6 pg (ref 26.0–34.0)
MCHC: 31.8 g/dL (ref 30.0–36.0)
MCV: 89.9 fL (ref 80.0–100.0)
Monocytes Absolute: 0.4 10*3/uL (ref 0.1–1.0)
Monocytes Relative: 8 %
Neutro Abs: 2.4 10*3/uL (ref 1.7–7.7)
Neutrophils Relative %: 54 %
Platelets: 240 10*3/uL (ref 150–400)
RBC: 4.06 MIL/uL (ref 3.87–5.11)
RDW: 14.6 % (ref 11.5–15.5)
WBC: 4.4 10*3/uL (ref 4.0–10.5)
nRBC: 0 % (ref 0.0–0.2)

## 2023-07-03 LAB — BASIC METABOLIC PANEL
Anion gap: 7 (ref 5–15)
BUN: 15 mg/dL (ref 8–23)
CO2: 21 mmol/L — ABNORMAL LOW (ref 22–32)
Calcium: 8.4 mg/dL — ABNORMAL LOW (ref 8.9–10.3)
Chloride: 109 mmol/L (ref 98–111)
Creatinine, Ser: 1.25 mg/dL — ABNORMAL HIGH (ref 0.44–1.00)
GFR, Estimated: 46 mL/min — ABNORMAL LOW (ref >60)
Glucose, Bld: 104 mg/dL — ABNORMAL HIGH (ref 70–99)
Potassium: 3.3 mmol/L — ABNORMAL LOW (ref 3.5–5.1)
Sodium: 137 mmol/L (ref 135–145)

## 2023-07-03 LAB — MAGNESIUM: Magnesium: 2.5 mg/dL — ABNORMAL HIGH (ref 1.7–2.4)

## 2023-07-03 MED ORDER — SODIUM CHLORIDE 0.9 % IV SOLN: INTRAVENOUS | Status: AC

## 2023-07-03 MED ORDER — POTASSIUM CHLORIDE CRYS ER 20 MEQ PO TBCR
40.0000 meq | EXTENDED_RELEASE_TABLET | Freq: Once | ORAL | Status: AC
  Administered 2023-07-03: 40 meq via ORAL
  Filled 2023-07-03: qty 2

## 2023-07-03 NOTE — Progress Notes (Signed)
Physical Therapy Treatment Patient Details Name: Jeanne [name] MRN: [id number] DOB: 09/21/1950 Today's Date: 07/03/2023   History of Present Illness 72 y/o female presented to ED on 06/29/23 for diarrhea and abdominal pain. Admitted for severe sepsis 2/2 Cdiff. PMH: hx of Cdiff, pancolitis, RA, cervical cancer (s/p radiation therapy), CKD-3b, HTN    PT Comments  Patient supine in bed on arrival and refusing OOB mobility but agreeable to bed level exercises. Performed SLR, hip abduction, hip adduction squeezes, ankle pumps, heel slides, and glute sets with limited motion 2/2 weakness. Applied mepiplex to lateral calf on R LE to cover red area. Discharge plan remains appropriate.   If plan is discharge home, recommend the following: Two people to help with walking and/or transfers;Two people to help with bathing/dressing/bathroom;Assistance with cooking/housework;Assist for transportation;Help with stairs or ramp for entrance;Supervision due to cognitive status   Can travel by private vehicle        Equipment Recommendations  Hoyer lift    Recommendations for Other Services       Precautions / Restrictions Precautions Precautions: Fall Restrictions Weight Bearing Restrictions: No     Mobility  Bed Mobility               General bed mobility comments: declined OOB mobility due to diarrhea and being sore but agreeable to bed level exercises    Transfers                        Ambulation/Gait                   Stairs             Wheelchair Mobility     Tilt Bed    Modified Rankin (Stroke Patients Only)       Balance                                            Cognition Arousal: Alert Behavior During Therapy: WFL for tasks assessed/performed Overall Cognitive Status: Within Functional Limits for tasks assessed                                 General Comments: decreased awareness into situation         Exercises General Exercises - Lower Extremity Ankle Circles/Pumps: Both, 10 reps, Supine Gluteal Sets: Both, 10 reps, Supine Heel Slides: AAROM, Both, 10 reps, Supine Hip ABduction/ADduction: Both, 10 reps, Supine Straight Leg Raises: AAROM, Both, 10 reps, Supine    General Comments        Pertinent Vitals/Pain Pain Assessment Pain Assessment: Faces Faces Pain Scale: Hurts little more Pain Location: "all over" Pain Descriptors / Indicators: Discomfort, Guarding Pain Intervention(s): Limited activity within patient's tolerance, Monitored during session, Repositioned    Home Living                          Prior Function            PT Goals (current goals can now be found in the care plan section) Acute Rehab PT Goals Patient Stated Goal: to stand again PT Goal Formulation: With patient/family Time For Goal Achievement: 07/15/23 Potential to Achieve Goals: Poor Progress towards PT goals: Progressing toward goals    Frequency  Min 1X/week      PT Plan      Co-evaluation              AM-PAC PT "6 Clicks" Mobility   Outcome Measure  Help needed turning from your back to your side while in a flat bed without using bedrails?: Total Help needed moving from lying on your back to sitting on the side of a flat bed without using bedrails?: Total Help needed moving to and from a bed to a chair (including a wheelchair)?: Total Help needed standing up from a chair using your arms (e.g., wheelchair or bedside chair)?: Total Help needed to walk in hospital room?: Total Help needed climbing 3-5 steps with a railing? : Total 6 Click Score: 6    End of Session   Activity Tolerance: No increased pain;Patient tolerated treatment well Patient left: in bed;with call bell/phone within reach;with family/visitor present Nurse Communication: Mobility status PT Visit Diagnosis: Unsteadiness on feet (R26.81);Muscle weakness (generalized) (M62.81);Other  abnormalities of gait and mobility (R26.89)     Time: 1032-1050 PT Time Calculation (min) (ACUTE ONLY): 18 min  Charges:    $Therapeutic Exercise: 8-22 mins PT General Charges $$ ACUTE PT VISIT: 1 Visit                     Maylon Peppers, PT, DPT Physical Therapist - [facility]  [facility]    Annakate Soulier A Lakesia Dahle 07/03/2023, 1:11 PM

## 2023-07-03 NOTE — Plan of Care (Signed)

## 2023-07-03 NOTE — Progress Notes (Addendum)
Nothing more to add from ID Treat Cdiff recurrence with long vanco taper Treatment of genitourinary syndrome of menopause and prevention of UTI discussed with patient- given material Avoid antibiotics Do not check urine for dysuria as it is likely to genito urinary syndrome of menopause ID will sign off. Call if needed

## 2023-07-03 NOTE — Progress Notes (Signed)
Progress Note   Patient: Jeanne [name] YQM:[id number] DOB: 1950-11-01 DOA: 06/29/2023     4 DOS: the patient was seen and examined on 07/03/2023   Brief hospital course:  Jeanne [name] is a 72 y.o. female with PMH significant for C. difficile pancolitis, rheumatoid arthritis with deformities, cervical cancer s/p radiation, chronic radiation cystitis, HTN, CKD 3, right hip surgery October 2023 and impaired mobility since then.  Since May this year, patient has had 5 episodes of C. difficile colitis accelerating her overall physical decline.  She does not have chronic diarrhea and is not on any suppressive therapy.   10/18, patient was tested for UTI by her PCP because of dysuria, burning and frequency for few days..  Urinalysis was positive patient was started on a prescription of Levaquin.  However, she had not started it yet.  Next morning, patient had, multiple episodes of diarrhea, vomiting and hence she was brought to the ED.   In the ED, patient was afebrile, heart rate in 110s, blood pressure in low 100s and 90s Initial labs with WBC count 23,000, lactic acid 2.1, BUN/creatinine 28/1.34 Stool  with C. difficile antigen positive, toxin negative but toxigenic C. difficile by PCR was positive toxin positive GI pathogen panel negative Urinalysis showed cloudy yellow urine with moderate hemoglobin, large leukocytes and positive nitrite and many bacteria CT abdomen pelvis did not show acute findings and showed chronic findings as below 1. Nonobstructing intrarenal stones on the right. Right parapelvic cysts.  2. Severe left renal atrophy. Hydronephrosis and hydroureter on the left appears progressed since prior study but may be chronic, resulting in the atrophy.  3. No evidence of bowel obstruction or inflammation. 4. Aortic atherosclerosis.   Urine culture and blood culture was sent Patient was started on Dificid for C. difficile as well as IV meropenem for UTI. Admitted to [facility]    Further hospital course and management as outlined below.       Assessment and Plan:  Severe sepsis POA Due to UTI and C. difficile colitis Met criteria with tachycardia, leukocytosis, lactic acidosis, hypotension, AKI Blood culture is sterile Urine culture yielded less than 10,000 colonies/insignificant growth Patient currently off antibiotics.  Was treated empirically with meropenem which has since been discontinued     UTI H/o MDR Klebsiella oxytoca UTI Per history, diagnosed with UTI the day prior to presentation on 10/18 and was prescribed Levaquin which she had not started yet. Repeat urinalysis on admission showed pyuria Given prior MDR Klebsiella UTI, patient was started on IV meropenem.  Also added fluconazole 100 mg daily for yeast infection. Urine culture however is showing only less than 10,000 CFU per mL of insignificant growth.   Patient was treated empirically with meropenem which has since been discontinued Continue to monitor symptoms.     Recurrent C. difficile colitis Reports 5 episode of C. difficile colitis in the last 5 months.   She does not have chronic diarrhea Appreciate ID input Patient is currently on Vanco taper    Hypotension Improved blood pressure with hydration    AKI  Baseline recent creatinine was 0.84 on 06/01/2023.  Presented with creatinine elevated 1.34 with sepsis.   Gradually improving with IV fluid.    Chronic obstructive uropathy CT scan on admission showed hydronephrosis and hydroureter on the left appears progressed since prior study but may be chronic, resulting in the atrophy. The distal ureter is not visualized due to streak artifact from hip prostheses and a distal obstructing stone is  not excluded.   May need outpatient follow-up with urology if renal function continues to worsen    Arthritis or polyarthritis, rheumatoid Continue home as needed oxycodone 10 mg every 6hrs.  Also added Continue Tylenol scheduled 3  times daily  Continue gabapentin         Subjective: No new complaints.  Continues to have diarrhea but improved from admission  Physical Exam: Vitals:   07/02/23 1526 07/02/23 2006 07/03/23 0434 07/03/23 0816  BP: (!) 132/58 121/69 130/61 125/63  Pulse: 74 76 71 70  Resp: 17 18 18 15   Temp: 98.5 F (36.9 C) 97.9 F (36.6 C) 98.2 F (36.8 C) 98.1 F (36.7 C)  TempSrc: Oral     SpO2: 100% 98% 99% 97%  Weight:      Height:       General: Alert, cooperative, no distress, appears stated age.  Head: Normocephalic, without obvious abnormality, atraumatic. Eyes: Conjunctivae clear, anicteric sclerae. Pupils are equal ENT Nares normal. No drainage or sinus tenderness. Lips, mucosa, and tongue normal. No Thrush Neck: Supple, symmetrical, no adenopathy, thyroid: non tender no carotid bruit and no JVD. Lungs: Clear to auscultation bilaterally. No Wheezing or Rhonchi. No rales. Heart: Regular rate and rhythm, no murmur, rub or gallop. Abdomen: Soft, non-tender,not distended. Bowel sounds normal. No masses Extremities: deformity of hands Skin: No rashes or lesions. Or bruising Lymph: Cervical, supraclavicular normal. Neurologic: Grossly non-focal   Data Reviewed: Labs reviewed.  Potassium 3.3, creatinine 1.25 above a baseline of 0.86 There are no new results to review at this time.  Family Communication: Plan of care discussed with patient in detail.  She verbalizes understanding and agrees with the plan  Disposition: Status is: Inpatient Remains inpatient appropriate because: Continues to have loose stools  Planned Discharge Destination: Home    Time spent: 33 minutes  Author: Lucile Shutters, MD 07/03/2023 12:16 PM  For on call review [email or web address].

## 2023-07-04 DIAGNOSIS — A0472 Enterocolitis due to Clostridium difficile, not specified as recurrent: Secondary | ICD-10-CM | POA: Diagnosis not present

## 2023-07-04 LAB — CBC
HCT: 32.6 % — ABNORMAL LOW (ref 36.0–46.0)
Hemoglobin: 10.5 g/dL — ABNORMAL LOW (ref 12.0–15.0)
MCH: 28.5 pg (ref 26.0–34.0)
MCHC: 32.2 g/dL (ref 30.0–36.0)
MCV: 88.6 fL (ref 80.0–100.0)
Platelets: 253 10*3/uL (ref 150–400)
RBC: 3.68 MIL/uL — ABNORMAL LOW (ref 3.87–5.11)
RDW: 14.7 % (ref 11.5–15.5)
WBC: 5.3 10*3/uL (ref 4.0–10.5)
nRBC: 0 % (ref 0.0–0.2)

## 2023-07-04 LAB — CULTURE, BLOOD (ROUTINE X 2)
Culture: NO GROWTH
Culture: NO GROWTH
Special Requests: ADEQUATE

## 2023-07-04 LAB — BASIC METABOLIC PANEL
Anion gap: 7 (ref 5–15)
BUN: 8 mg/dL (ref 8–23)
CO2: 23 mmol/L (ref 22–32)
Calcium: 8.2 mg/dL — ABNORMAL LOW (ref 8.9–10.3)
Chloride: 110 mmol/L (ref 98–111)
Creatinine, Ser: 1.16 mg/dL — ABNORMAL HIGH (ref 0.44–1.00)
GFR, Estimated: 51 mL/min — ABNORMAL LOW (ref >60)
Glucose, Bld: 99 mg/dL (ref 70–99)
Potassium: 3.5 mmol/L (ref 3.5–5.1)
Sodium: 140 mmol/L (ref 135–145)

## 2023-07-04 MED ORDER — VANCOMYCIN HCL 125 MG PO CAPS: ORAL_CAPSULE | ORAL | 0 refills | Status: AC

## 2023-07-04 NOTE — Progress Notes (Signed)
Progress Note   Patient: Jeanne [name] ZOX:[id number] DOB: [birth year] DOA: 06/29/2023     5 DOS: the patient was seen and examined on 07/04/2023   Brief hospital course:  TU FERENCE is a 72 y.o. female with PMH significant for C. difficile pancolitis, rheumatoid arthritis with deformities, cervical cancer s/p radiation, chronic radiation cystitis, HTN, CKD 3, right hip surgery October 2023 and impaired mobility since then.  Since May this year, patient has had 5 episodes of C. difficile colitis accelerating her overall physical decline.  She does not have chronic diarrhea and is not on any suppressive therapy.   10/18, patient was tested for UTI by her PCP because of dysuria, burning and frequency for few days..  Urinalysis was positive patient was started on a prescription of Levaquin.  However, she had not started it yet.  Next morning, patient had, multiple episodes of diarrhea, vomiting and hence she was brought to the ED.   In the ED, patient was afebrile, heart rate in 110s, blood pressure in low 100s and 90s Initial labs with WBC count 23,000, lactic acid 2.1, BUN/creatinine 28/1.34 Stool  with C. difficile antigen positive, toxin negative but toxigenic C. difficile by PCR was positive toxin positive GI pathogen panel negative Urinalysis showed cloudy yellow urine with moderate hemoglobin, large leukocytes and positive nitrite and many bacteria CT abdomen pelvis did not show acute findings and showed chronic findings as below 1. Nonobstructing intrarenal stones on the right. Right parapelvic cysts.  2. Severe left renal atrophy. Hydronephrosis and hydroureter on the left appears progressed since prior study but may be chronic, resulting in the atrophy.  3. No evidence of bowel obstruction or inflammation. 4. Aortic atherosclerosis.   Urine culture and blood culture was sent Patient was started on Dificid for C. difficile as well as IV meropenem for UTI. Admitted to [facility]    Further hospital course and management as outlined below.   Assessment and Plan:   Severe sepsis POA Due to presumed UTI and C. difficile colitis Met criteria with tachycardia, leukocytosis, lactic acidosis, hypotension, AKI Blood culture is sterile Urine culture yielded less than 10,000 colonies/insignificant growth Patient currently off antibiotics.  Was treated empirically with meropenem which has since been discontinued       UTI H/o MDR Klebsiella oxytoca UTI Per history, diagnosed with UTI the day prior to presentation on 10/18 and was prescribed Levaquin which she had not started yet. Repeat urinalysis on admission showed pyuria Given prior MDR Klebsiella UTI, patient was started on IV meropenem.  Also added fluconazole 100 mg daily for yeast infection. Urine culture however is showing only less than 10,000 CFU per mL of insignificant growth.   Patient was treated empirically with meropenem which has since been discontinued Continue to monitor symptoms.       Recurrent C. difficile colitis Reports 5 episode of C. difficile colitis in the last 5 months.   No further episodes of diarrhea Will observe over the next 24 hours and if diarrhea has resolved will discharge. Appreciate ID input Patient is currently on Vanco taper     Hypotension Improved blood pressure with hydration     AKI  Baseline recent creatinine was 0.84 on 06/01/2023.  Presented with creatinine elevated 1.34 with sepsis.   Gradually improving with IV fluid.     Chronic obstructive uropathy CT scan on admission showed hydronephrosis and hydroureter on the left appears progressed since prior study but may be chronic, resulting in the atrophy. The  distal ureter is not visualized due to streak artifact from hip prostheses and a distal obstructing stone is not excluded.   May need outpatient follow-up with urology if renal function continues to worsen     Arthritis or polyarthritis,  rheumatoid Continue home as needed oxycodone 10 mg every 6hrs.  Also added Continue Tylenol scheduled 3 times daily  Continue gabapentin            Subjective: Patient is seen and examined at the bedside.  No further episodes of diarrhea today  Physical Exam: Vitals:   07/03/23 1531 07/03/23 2044 07/04/23 0415 07/04/23 0753  BP: 134/60 127/60 135/62 (!) 135/49  Pulse: 70 77 67 65  Resp: 16 18  16   Temp: 98.1 F (36.7 C) 98.2 F (36.8 C) 98.3 F (36.8 C) 98 F (36.7 C)  TempSrc:      SpO2: 99% 100% 99% 98%  Weight:      Height:       General: Alert, cooperative, no distress, appears stated age.  Head: Normocephalic, without obvious abnormality, atraumatic. Eyes: Conjunctivae clear, anicteric sclerae. Pupils are equal ENT Nares normal. No drainage or sinus tenderness. Lips, mucosa, and tongue normal. No Thrush Neck: Supple, symmetrical, no adenopathy, thyroid: non tender no carotid bruit and no JVD. Lungs: Clear to auscultation bilaterally. No Wheezing or Rhonchi. No rales. Heart: Regular rate and rhythm, no murmur, rub or gallop. Abdomen: Soft, non-tender,not distended. Bowel sounds normal. No masses Extremities: deformity of hands Skin: No rashes or lesions. Or bruising Lymph: Cervical, supraclavicular normal. Neurologic: Grossly non-focal   Data Reviewed: Labs reviewed There are no new results to review at this time.  Family Communication: Plan of care discussed with patient and her husband at the bedside.  All questions and concerns have been addressed.  Disposition: Status is: Inpatient Remains inpatient appropriate because: Possible discharge in a.m.  Planned Discharge Destination: Home with Home Health    Time spent: 33 minutes  Author: Lucile Shutters, MD 07/04/2023 11:20 AM  For on call review [email or web address].

## 2023-07-04 NOTE — Progress Notes (Signed)
Pharmacy - Antimicrobial Stewardship  Patient with recurrent C difficile infection.  Plan is 12 week vancomycin taper.  Her PCP [facility] prior to admit.  She had not picked it up.  Messaged Dr Joylene Igo asking if can send in new prescription with tapering dose vancomycin.  She agreed and [facility] to [facility] previous prescription and fill the tapering dose.  [facility] $100, I asked if they would accept the Good Rx price of $65 and they said yes.  Sue Lush, pharmacy resident, helped with providing patient Good Rx coupon as well as making calendar that shows what dose of vancomycin she will be taking during this long taper.  Suggestion also made for her to perhaps follow-up with GI provider to evaluate if appropriate for other treatments to reduce risk of C difficile (avoiding unnecessary antibiotics will be important- See infectious diseases note)  Juliette Alcide, PharmD, BCPS, BCIDP Work Cell: [phone] 07/04/2023 5:25 PM

## 2023-07-04 NOTE — Plan of Care (Signed)

## 2023-07-05 DIAGNOSIS — A0472 Enterocolitis due to Clostridium difficile, not specified as recurrent: Secondary | ICD-10-CM | POA: Diagnosis not present

## 2023-07-05 MED ORDER — RISAQUAD PO CAPS: 1.0000 | ORAL_CAPSULE | Freq: Every day | ORAL | 0 refills | Status: AC

## 2023-07-05 MED ORDER — ESTROGENS CONJUGATED 0.625 MG/GM VA CREA: TOPICAL_CREAM | VAGINAL | 12 refills | Status: AC

## 2023-07-05 MED ORDER — GERHARDT'S BUTT CREAM: 1.0000 | TOPICAL_CREAM | Freq: Three times a day (TID) | CUTANEOUS | 0 refills | Status: AC

## 2023-07-05 NOTE — TOC Transition Note (Signed)
Transition of Care [facility]) - CM/SW Discharge Note   Patient Details  Name: Jeanne [name] MRN: [id number] Date of Birth: November 22, 1950  [facility] CM/SW Contact:  Garret Reddish, RN Phone Number: 07/05/2023, 1:31 PM   Clinical Narrative:    Chart reviewed.  Noted that patient has orders for discharge today.    I have informed [place] with [facility] that patient will be a discharge for today.  [place] reports that patient is an active Home Health patient.  [facility] will provide home health PT and OT at home.   I have arranged EMS transport via [facility] for transport home today. Patient reports that she has notified her family.    I have made staff nurse aware.     Final next level of care: Home w Home Health Services Barriers to Discharge: No Barriers Identified   Patient Goals and CMS Choice CMS Medicare.gov Compare Post Acute Care list provided to:: Patient Represenative (must comment) Choice offered to / list presented to : Patient  Discharge Placement                  Patient to be transferred to facility by: [facility] Name of family member notified: Patient reports that she has notified her family Patient and family notified of of transfer: 07/05/23  Discharge Plan and Services Additional resources added to the After Visit Summary for                            [facility] Arranged: PT, OT HH Agency: [facility] (Patient is active with [facility]) Date HH Agency Contacted: 07/05/23      Social Determinants of Health (SDOH) Interventions SDOH Screenings   Food Insecurity: No Food Insecurity (07/01/2023)  Housing: Low Risk  (07/01/2023)  Transportation Needs: No Transportation Needs (07/01/2023)  Utilities: Not At Risk (07/01/2023)  Alcohol Screen: Low Risk  (08/10/2020)  Depression (PHQ2-9): Low Risk  (08/10/2020)  Financial Resource Strain: Low Risk  (04/24/2022)   Received from [facility], [facility]  Physical Activity: Inactive (08/10/2020)  Social Connections: Moderately Integrated (08/10/2020)  Stress: No Stress Concern Present (08/10/2020)  Tobacco Use: Medium Risk (07/01/2023)     Readmission Risk Interventions    07/01/2023   10:31 AM 03/01/2023   10:56 AM 01/02/2023   12:24 PM  Readmission Risk Prevention Plan  Transportation Screening Complete Complete Complete  PCP or Specialist Appt within 3-5 Days  Complete Complete  HRI or Home Care Consult  Complete   Social Work Consult for Recovery Care Planning/Counseling  Complete Complete  Palliative Care Screening  Not Applicable Not Applicable  Medication Review [employer])  Complete Complete  PCP or Specialist appointment within 3-5 days of discharge Complete    HRI or Home Care Consult Complete    SW Recovery Care/Counseling Consult Complete    Palliative Care Screening Not Applicable    Skilled Nursing Facility Not Applicable

## 2023-07-05 NOTE — Discharge Summary (Signed)
Physician Discharge Summary   Patient: Jeanne [name] MRN: [id number] DOB: 12-29-1950  Admit date:     06/29/2023  Discharge date: 07/05/23  Discharge Physician: Reginia Battie   PCP: Jerl Mina, MD   Recommendations at discharge:   Complete course of vancomycin  Discharge Diagnoses: Principal Problem:   C. difficile colitis Active Problems:   Severe sepsis (HCC)   CKD stage 3b, GFR 30-44 ml/min (HCC)   Arthritis or polyarthritis, rheumatoid (HCC)   UTI (urinary tract infection)   Difficulty walking   Dysuria  Resolved Problems:   * No resolved hospital problems. *  Hospital Course:  Jeanne [name] is a 72 y.o. female with medical history significant of C. difficile colitis, pancolitis, rheumatoid arthritis with finger deformity, kidney stone, cervical cancer (s/p of radiation therapy),, hypotension, CKD-3b, chronic radiation cystitis, bed-bound, who presents with diarrhea and abdominal pain.   Patient states that she started having nausea, dry heaves, diarrhea, abdominal pain since yesterday.  She has had more than 5 times of watery diarrhea since yesterday.  Her abdominal pain is located in lower abdomen, intermittent, aching, mild, nonradiating, not aggravated or alleviated by any known factors.  No fever or chills.  Patient has dysuria, burning on urination and increased urinary frequency. Pt was tested positive for UTI yesterday. UA showed turbid appearance, 3+ leukocyte, positive nitrite, bacteria> 50 with budding yeast. Pt is given prescription for Levaquin.   Data reviewed independently and ED Course: pt was found to have C diff test (positive antigen, positive PCR negative toxin), WBC 23.0, lactic acid of 2.1, renal function close to baseline, temperature normal, blood pressure 107/64, heart rate 127, RR 22, oxygen saturation 97% on room air.  Patient is admitted to telemetry bed as inpatient.     Assessment and Plan:  Severe sepsis POA Due to presumed UTI  and C. difficile colitis Met criteria with tachycardia, leukocytosis, lactic acidosis, hypotension, AKI Blood culture is sterile Urine culture yielded less than 10,000 colonies/insignificant growth Patient currently off antibiotics.  Was treated empirically with meropenem which has since been discontinued       UTI H/o MDR Klebsiella oxytoca UTI Per history, diagnosed with UTI the day prior to presentation on 10/18 and was prescribed Levaquin which she had not started yet. Repeat urinalysis on admission showed pyuria Given prior MDR Klebsiella UTI, patient was started on IV meropenem.  Also added fluconazole 100 mg daily for yeast infection. Urine culture however  showed only less than 10,000 CFU per mL of insignificant growth.   Patient was treated empirically with meropenem which has since been discontinued Continue to monitor symptoms.       Recurrent C. difficile colitis Reports 5 episode of C. difficile colitis in the last 5 months.   Had 2 soft stools over the last 24 hours Appreciate ID input Will discharge patient home to complete vancomycin taper     Hypotension Improved blood pressure with hydration     AKI  Baseline recent creatinine was 0.84 on 06/01/2023.  Presented with creatinine elevated 1.34 with sepsis.   Gradually improving with IV fluid.     Chronic obstructive uropathy CT scan on admission showed hydronephrosis and hydroureter on the left appears progressed since prior study but may be chronic, resulting in the atrophy. The distal ureter is not visualized due to streak artifact from hip prostheses and a distal obstructing stone is not excluded.   May need outpatient follow-up with urology if renal function continues to worsen  Arthritis or polyarthritis, rheumatoid Continue home as needed oxycodone 10 mg every 6hrs.  Also added Continue Tylenol scheduled 3 times daily  Continue gabapentin                  Consultants: Infectious  disease Procedures performed: None Disposition:  Home with home health Diet recommendation:  Discharge Diet Orders (From admission, onward)     Start     Ordered   07/05/23 0000  Diet - low sodium heart healthy        07/05/23 1156           Regular diet DISCHARGE MEDICATION: Allergies as of 07/05/2023       Reactions   Codeine Nausea Only, Nausea And Vomiting   Augmentin [amoxicillin-pot Clavulanate] Nausea Only   Very nauseated/ feels sore on body    Ceftriaxone Other (See Comments)   "Pins and needles" sensation in legs        Medication List     STOP taking these medications    levofloxacin 500 MG tablet Commonly known as: LEVAQUIN   loperamide 2 MG capsule Commonly known as: IMODIUM   midodrine 5 MG tablet Commonly known as: PROAMATINE   vitamin A 3 MG (10000 UNITS) capsule   Vitamin D (Ergocalciferol) 1.25 MG (50000 UNIT) Caps capsule Commonly known as: DRISDOL   zinc sulfate 220 (50 Zn) MG capsule       TAKE these medications    acidophilus Caps capsule Take 1 capsule by mouth daily. Start taking on: July 06, 2023   conjugated estrogens vaginal cream Commonly known as: PREMARIN Place vaginally 3 (three) times a week. Start taking on: July 08, 2023   gabapentin 100 MG capsule Commonly known as: NEURONTIN Take 100 mg by mouth at bedtime.   Gerhardt's butt cream Crea Apply 1 Application topically 3 (three) times daily.   oxyCODONE 5 MG immediate release tablet Commonly known as: Oxy IR/ROXICODONE Take 1 tablet (5 mg total) by mouth every 6 (six) hours as needed.   vancomycin 125 MG capsule Commonly known as: VANCOCIN Take 1 capsule (125 mg total) by mouth 4 (four) times daily for 12 days, THEN 1 capsule (125 mg total) 2 (two) times daily for 7 days, THEN 1 capsule (125 mg total) daily for 7 days, THEN 1 capsule (125 mg total) every other day for 28 days, THEN 1 capsule (125 mg total) every 3 (three) days for 28 days. Start taking  on: July 05, 2023 What changed: See the new instructions.        Discharge Exam: Filed Weights   06/29/23 1833  Weight: 62.3 kg   General: Alert, cooperative, no distress, appears stated age.  Head: Normocephalic, without obvious abnormality, atraumatic. Eyes: Conjunctivae clear, anicteric sclerae. Pupils are equal ENT Nares normal. No drainage or sinus tenderness. Lips, mucosa, and tongue normal. No Thrush Neck: Supple, symmetrical, no adenopathy, thyroid: non tender no carotid bruit and no JVD. Lungs: Clear to auscultation bilaterally. No Wheezing or Rhonchi. No rales. Heart: Regular rate and rhythm, no murmur, rub or gallop. Abdomen: Soft, non-tender,not distended. Bowel sounds normal. No masses Extremities: deformity of hands Skin: No rashes or lesions. Or bruising Lymph: Cervical, supraclavicular normal. Neurologic: Grossly non-focal    Condition at discharge: stable  The results of significant diagnostics from this hospitalization (including imaging, microbiology, ancillary and laboratory) are listed below for reference.   Imaging Studies: CT ABDOMEN PELVIS W CONTRAST  Result Date: 06/30/2023 CLINICAL DATA:  Acute nonlocalized abdominal pain.  Diarrhea and abdominal pain for 2 hours. Vomiting. History of C difficile. EXAM: CT ABDOMEN AND PELVIS WITH CONTRAST TECHNIQUE: Multidetector CT imaging of the abdomen and pelvis was performed using the standard protocol following bolus administration of intravenous contrast. RADIATION DOSE REDUCTION: This exam was performed according to the departmental dose-optimization program which includes automated exposure control, adjustment of the mA and/or kV according to patient size and/or use of iterative reconstruction technique. CONTRAST:  80mL OMNIPAQUE IOHEXOL 300 MG/ML  SOLN COMPARISON:  05/25/2023 FINDINGS: Lower chest: Lung bases are clear. Hepatobiliary: No focal liver abnormality is seen. No gallstones, gallbladder wall  thickening, or biliary dilatation. Pancreas: Fatty infiltration of the pancreas. No acute inflammatory changes. Spleen: Normal in size without focal abnormality. Adrenals/Urinary Tract: No adrenal gland nodules. Bilateral renal parenchymal atrophy, more severe on the left. Prominent parapelvic cysts in the right. No imaging follow-up is indicated. Right renal collecting system and ureter are decompressed. Two right intrarenal stones, largest measuring 2 mm diameter. The left kidney demonstrates hydronephrosis with prominent hydroureter. Distal ureter is not visualized due to streak artifact from bilateral hip arthroplasties. Ureterectasis is increased since the prior study. A distal ureteral stone or possibly an obstructing lesion can not be excluded. The bladder is also mostly obscured visualization. There appears to be gas in the bladder which could indicate cystitis or previous catheterization. Stomach/Bowel: Stomach is filled with ingested material. No wall thickening. Small bowel, and colon are not abnormally distended. No wall thickening or inflammatory changes are appreciated. Visualization of the rectosigmoid colon is limited due to streak artifact from the hip prostheses. Appendix is not identified. Vascular/Lymphatic: No significant vascular findings are present. No enlarged abdominal or pelvic lymph nodes. Surgical clips in the paracaval upper pelvis. Reproductive: No pelvic mass identified. Other: No free air or free fluid in the abdomen. Stranding in the right flank and hip soft tissues likely representing contusion or edema. Musculoskeletal: Bilateral hip arthroplasties. Streak artifact limits evaluation. Compression deformities at T12, L1, and L5. No change since prior study. No acute bony abnormalities are identified. IMPRESSION: 1. Nonobstructing intrarenal stones on the right. Right parapelvic cysts. No imaging follow-up is indicated. 2. Severe left renal atrophy. Hydronephrosis and hydroureter on  the left appears progressed since prior study but may be chronic, resulting in the atrophy. The distal ureter is not visualized due to streak artifact from hip prostheses and a distal obstructing stone is not excluded. 3. No evidence of bowel obstruction or inflammation. 4. Aortic atherosclerosis. Electronically Signed   By: Burman Nieves M.D.   On: 06/30/2023 00:18    Microbiology: Results for orders placed or performed during the hospital encounter of 06/29/23  C Difficile Quick Screen w PCR reflex     Status: Abnormal   Collection Time: 06/29/23  7:06 PM   Specimen: STOOL  Result Value Ref Range Status   C Diff antigen POSITIVE (A) NEGATIVE Final   C Diff toxin NEGATIVE NEGATIVE Final   C Diff interpretation Results are indeterminate. See PCR results.  Final    Comment: Performed at [facility], [address] [place], [place] [address]  C. Diff by PCR, Reflexed     Status: Abnormal   Collection Time: 06/29/23  7:06 PM  Result Value Ref Range Status   Toxigenic C. Difficile by PCR POSITIVE (A) NEGATIVE Final    Comment: Positive for toxigenic C. difficile with little to no toxin production. Only treat if clinical presentation suggests symptomatic illness. CRITICAL RESULT CALLED TO, READ BACK  BY AND VERIFIED WITH: ROSE COBURN RN @ [phone] 06/29/23 [facility] Performed at [facility], [address] [place], [place] [address]   Gastrointestinal Panel by PCR , Stool     Status: None   Collection Time: 06/29/23  7:06 PM   Specimen: Stool  Result Value Ref Range Status   Campylobacter species NOT DETECTED NOT DETECTED Final   Plesimonas shigelloides NOT DETECTED NOT DETECTED Final   Salmonella species NOT DETECTED NOT DETECTED Final   Yersinia enterocolitica NOT DETECTED NOT DETECTED Final   Vibrio species NOT DETECTED NOT DETECTED Final   Vibrio cholerae NOT DETECTED NOT DETECTED Final   Enteroaggregative E coli (EAEC) NOT DETECTED NOT DETECTED Final   Enteropathogenic  E coli (EPEC) NOT DETECTED NOT DETECTED Final   Enterotoxigenic E coli (ETEC) NOT DETECTED NOT DETECTED Final   Shiga like toxin producing E coli (STEC) NOT DETECTED NOT DETECTED Final   Shigella/Enteroinvasive E coli (EIEC) NOT DETECTED NOT DETECTED Final   Cryptosporidium NOT DETECTED NOT DETECTED Final   Cyclospora cayetanensis NOT DETECTED NOT DETECTED Final   Entamoeba histolytica NOT DETECTED NOT DETECTED Final   Giardia lamblia NOT DETECTED NOT DETECTED Final   Adenovirus F40/41 NOT DETECTED NOT DETECTED Final   Astrovirus NOT DETECTED NOT DETECTED Final   Norovirus GI/GII NOT DETECTED NOT DETECTED Final   Rotavirus A NOT DETECTED NOT DETECTED Final   Sapovirus (I, II, IV, and V) NOT DETECTED NOT DETECTED Final    Comment: Performed at [facility], [address] [place], [place] [address]  Blood Culture (routine x 2)     Status: None   Collection Time: 06/29/23  9:47 PM   Specimen: BLOOD  Result Value Ref Range Status   Specimen Description BLOOD RIGHT ANTECUBITAL  Final   Special Requests   Final    BOTTLES DRAWN AEROBIC ONLY Blood Culture adequate volume   Culture   Final    NO GROWTH 5 DAYS Performed at [facility], [address] [place], [place] [address]    Report Status 07/04/2023 FINAL  Final  Blood Culture (routine x 2)     Status: None   Collection Time: 06/29/23 10:40 PM   Specimen: BLOOD  Result Value Ref Range Status   Specimen Description BLOOD BLOOD RIGHT HAND  Final   Special Requests   Final    BOTTLES DRAWN AEROBIC ONLY Blood Culture results may not be optimal due to an inadequate volume of blood received in culture bottles   Culture   Final    NO GROWTH 5 DAYS Performed at [facility], [address] [place], [place] [address]    Report Status 07/04/2023 FINAL  Final  Urine Culture     Status: Abnormal   Collection Time: 06/30/23  3:07 AM   Specimen: Urine, Random  Result Value Ref Range Status   Specimen  Description   Final    URINE, RANDOM Performed at [facility], [address] [place], [place] [address]    Special Requests   Final    NONE Reflexed from [id number] Performed at [facility], [address] [place], [place] [address]    Culture (A)  Final    <10,000 COLONIES/mL INSIGNIFICANT GROWTH Performed at [facility], [address], [place], [place] [address]    Report Status 07/01/2023 FINAL  Final    Labs: CBC: Recent Labs  Lab 06/30/23 0457 07/01/23 0416 07/02/23 0425 07/03/23 0452 07/04/23 1133  WBC  12.3* 6.1 4.5 4.4 5.3  NEUTROABS  --  4.6 3.2 2.4  --   HGB 11.5* 10.6* 10.4* 11.6* 10.5*  HCT 37.9 34.5* 33.4* 36.5 32.6*  MCV 92.9 92.0 92.3 89.9 88.6  PLT 312 205 194 240 253   Basic Metabolic Panel: Recent Labs  Lab 06/29/23 2240 06/30/23 0457 07/01/23 0416 07/02/23 0425 07/03/23 0452 07/04/23 1133  NA  --  137 139 136 137 140  K  --  4.4 3.8 3.8 3.3* 3.5  CL  --  106 110 108 109 110  CO2  --  23 21* 21* 21* 23  GLUCOSE  --  100* 127* 105* 104* 99  BUN  --  26* 19 17 15 8   CREATININE  --  1.25* 1.20* 1.19* 1.25* 1.16*  CALCIUM  --  8.5* 8.4* 8.1* 8.4* 8.2*  MG 1.9  --  1.6*  --  2.5*  --   PHOS 4.0  --  2.8  --   --   --    Liver Function Tests: Recent Labs  Lab 06/29/23 2017  AST 23  ALT 17  ALKPHOS 165*  BILITOT 1.0  PROT 7.1  ALBUMIN 3.2*   CBG: No results for input(s): "GLUCAP" in the last 168 hours.  Discharge time spent: greater than 30 minutes.  Signed: Lucile Shutters, MD [facility] 07/05/2023

## 2023-07-05 NOTE — Consult Note (Signed)
[facility]) Accountable Care Organization (ACO) [facility] Liaison Note  07/05/2023  Jeanne [name] 11/24/1950 [id number]  Location: [facility] RN Hospital Liaison screened the patient remotely at [facility].  Insurance: [employer] Medicare Advantage   Jeanne [name] is a 72 y.o. female who is a Primary Care Patient of Jerl Mina, MD-[facility]. The patient was screened for 30 day readmission hospitalization with noted extreme risk score for unplanned readmission risk with 5 IP in 6 months.  The patient was assessed for potential [facility]) Care Management service needs for post hospital transition for care coordination. Review of patient's electronic medical record reveals patient with C-Diff. Provider's office will provide care management services and TOC follow up.     [facility] Care Management/Population Health does not replace or interfere with any arrangements made by the Inpatient Transition of Care team.   For questions contact:   Elliot Cousin, RN, [facility] Liaison [place]   [facility], Population Health Office Hours MTWF  8:00 am-6:00 pm Direct Dial: [phone] mobile [phone] [Office toll free line] Office Hours are M-F 8:30 - 5 pm [email or web address]

## 2023-07-09 DIAGNOSIS — A0472 Enterocolitis due to Clostridium difficile, not specified as recurrent: Secondary | ICD-10-CM | POA: Diagnosis not present

## 2023-07-09 DIAGNOSIS — A4150 Gram-negative sepsis, unspecified: Secondary | ICD-10-CM | POA: Diagnosis not present

## 2023-07-09 DIAGNOSIS — Z96641 Presence of right artificial hip joint: Secondary | ICD-10-CM | POA: Diagnosis not present

## 2023-07-09 DIAGNOSIS — M069 Rheumatoid arthritis, unspecified: Secondary | ICD-10-CM | POA: Diagnosis not present

## 2023-07-09 DIAGNOSIS — I130 Hypertensive heart and chronic kidney disease with heart failure and stage 1 through stage 4 chronic kidney disease, or unspecified chronic kidney disease: Secondary | ICD-10-CM | POA: Diagnosis not present

## 2023-07-09 DIAGNOSIS — Z8616 Personal history of COVID-19: Secondary | ICD-10-CM | POA: Diagnosis not present

## 2023-07-09 DIAGNOSIS — N1832 Chronic kidney disease, stage 3b: Secondary | ICD-10-CM | POA: Diagnosis not present

## 2023-07-09 DIAGNOSIS — E559 Vitamin D deficiency, unspecified: Secondary | ICD-10-CM | POA: Diagnosis not present

## 2023-07-09 DIAGNOSIS — D631 Anemia in chronic kidney disease: Secondary | ICD-10-CM | POA: Diagnosis not present

## 2023-07-09 DIAGNOSIS — N136 Pyonephrosis: Secondary | ICD-10-CM | POA: Diagnosis not present

## 2023-07-09 DIAGNOSIS — I5030 Unspecified diastolic (congestive) heart failure: Secondary | ICD-10-CM | POA: Diagnosis not present

## 2023-07-09 DIAGNOSIS — A4101 Sepsis due to Methicillin susceptible Staphylococcus aureus: Secondary | ICD-10-CM | POA: Diagnosis not present

## 2023-07-09 DIAGNOSIS — Z8541 Personal history of malignant neoplasm of cervix uteri: Secondary | ICD-10-CM | POA: Diagnosis not present

## 2023-07-09 DIAGNOSIS — I959 Hypotension, unspecified: Secondary | ICD-10-CM | POA: Diagnosis not present

## 2023-07-09 DIAGNOSIS — E538 Deficiency of other specified B group vitamins: Secondary | ICD-10-CM | POA: Diagnosis not present

## 2023-07-10 ENCOUNTER — Inpatient Hospital Stay
Admission: EM | Admit: 2023-07-10 | Discharge: 2023-07-14 | DRG: 872 | Disposition: A | Discharge status: Home Health | Payer: Medicare HMO | Attending: Internal Medicine | Admitting: Internal Medicine

## 2023-07-10 ENCOUNTER — Emergency Department: Payer: Medicare HMO

## 2023-07-10 DIAGNOSIS — K8689 Other specified diseases of pancreas: Secondary | ICD-10-CM | POA: Diagnosis present

## 2023-07-10 DIAGNOSIS — E872 Acidosis, unspecified: Secondary | ICD-10-CM | POA: Diagnosis present

## 2023-07-10 DIAGNOSIS — Z885 Allergy status to narcotic agent status: Secondary | ICD-10-CM

## 2023-07-10 DIAGNOSIS — Z8541 Personal history of malignant neoplasm of cervix uteri: Secondary | ICD-10-CM

## 2023-07-10 DIAGNOSIS — Z743 Need for continuous supervision: Secondary | ICD-10-CM | POA: Diagnosis not present

## 2023-07-10 DIAGNOSIS — N1832 Chronic kidney disease, stage 3b: Secondary | ICD-10-CM | POA: Diagnosis present

## 2023-07-10 DIAGNOSIS — R1030 Lower abdominal pain, unspecified: Secondary | ICD-10-CM | POA: Diagnosis not present

## 2023-07-10 DIAGNOSIS — N132 Hydronephrosis with renal and ureteral calculous obstruction: Secondary | ICD-10-CM | POA: Diagnosis present

## 2023-07-10 DIAGNOSIS — K529 Noninfective gastroenteritis and colitis, unspecified: Secondary | ICD-10-CM

## 2023-07-10 DIAGNOSIS — A0472 Enterocolitis due to Clostridium difficile, not specified as recurrent: Secondary | ICD-10-CM | POA: Diagnosis present

## 2023-07-10 DIAGNOSIS — Z96651 Presence of right artificial knee joint: Secondary | ICD-10-CM | POA: Diagnosis present

## 2023-07-10 DIAGNOSIS — I129 Hypertensive chronic kidney disease with stage 1 through stage 4 chronic kidney disease, or unspecified chronic kidney disease: Secondary | ICD-10-CM | POA: Diagnosis present

## 2023-07-10 DIAGNOSIS — A419 Sepsis, unspecified organism: Principal | ICD-10-CM | POA: Diagnosis present

## 2023-07-10 DIAGNOSIS — Z8249 Family history of ischemic heart disease and other diseases of the circulatory system: Secondary | ICD-10-CM

## 2023-07-10 DIAGNOSIS — Z87891 Personal history of nicotine dependence: Secondary | ICD-10-CM

## 2023-07-10 DIAGNOSIS — R109 Unspecified abdominal pain: Secondary | ICD-10-CM

## 2023-07-10 DIAGNOSIS — J449 Chronic obstructive pulmonary disease, unspecified: Secondary | ICD-10-CM | POA: Diagnosis present

## 2023-07-10 DIAGNOSIS — Z7401 Bed confinement status: Secondary | ICD-10-CM

## 2023-07-10 DIAGNOSIS — R197 Diarrhea, unspecified: Secondary | ICD-10-CM | POA: Diagnosis not present

## 2023-07-10 DIAGNOSIS — Z90710 Acquired absence of both cervix and uterus: Secondary | ICD-10-CM

## 2023-07-10 DIAGNOSIS — Z888 Allergy status to other drugs, medicaments and biological substances status: Secondary | ICD-10-CM

## 2023-07-10 DIAGNOSIS — R5381 Other malaise: Secondary | ICD-10-CM | POA: Diagnosis present

## 2023-07-10 DIAGNOSIS — M0800 Unspecified juvenile rheumatoid arthritis of unspecified site: Secondary | ICD-10-CM | POA: Diagnosis present

## 2023-07-10 DIAGNOSIS — I2510 Atherosclerotic heart disease of native coronary artery without angina pectoris: Secondary | ICD-10-CM | POA: Diagnosis present

## 2023-07-10 DIAGNOSIS — A0471 Enterocolitis due to Clostridium difficile, recurrent: Secondary | ICD-10-CM | POA: Diagnosis present

## 2023-07-10 DIAGNOSIS — Z881 Allergy status to other antibiotic agents status: Secondary | ICD-10-CM

## 2023-07-10 DIAGNOSIS — Z923 Personal history of irradiation: Secondary | ICD-10-CM

## 2023-07-10 DIAGNOSIS — Z0389 Encounter for observation for other suspected diseases and conditions ruled out: Secondary | ICD-10-CM | POA: Diagnosis not present

## 2023-07-10 DIAGNOSIS — R110 Nausea: Secondary | ICD-10-CM | POA: Diagnosis not present

## 2023-07-10 DIAGNOSIS — Z79899 Other long term (current) drug therapy: Secondary | ICD-10-CM

## 2023-07-10 DIAGNOSIS — Z803 Family history of malignant neoplasm of breast: Secondary | ICD-10-CM

## 2023-07-10 DIAGNOSIS — E785 Hyperlipidemia, unspecified: Secondary | ICD-10-CM | POA: Diagnosis present

## 2023-07-10 DIAGNOSIS — Z96643 Presence of artificial hip joint, bilateral: Secondary | ICD-10-CM | POA: Diagnosis present

## 2023-07-10 LAB — CBC WITH DIFFERENTIAL/PLATELET
Abs Immature Granulocytes: 0.17 10*3/uL — ABNORMAL HIGH (ref 0.00–0.07)
Basophils Absolute: 0.1 10*3/uL (ref 0.0–0.1)
Basophils Relative: 0 %
Eosinophils Absolute: 0.1 10*3/uL (ref 0.0–0.5)
Eosinophils Relative: 0 %
HCT: 40.1 % (ref 36.0–46.0)
Hemoglobin: 13 g/dL (ref 12.0–15.0)
Immature Granulocytes: 1 %
Lymphocytes Relative: 2 %
Lymphs Abs: 0.6 10*3/uL — ABNORMAL LOW (ref 0.7–4.0)
MCH: 28.8 pg (ref 26.0–34.0)
MCHC: 32.4 g/dL (ref 30.0–36.0)
MCV: 88.9 fL (ref 80.0–100.0)
Monocytes Absolute: 0.8 10*3/uL (ref 0.1–1.0)
Monocytes Relative: 3 %
Neutro Abs: 27.2 10*3/uL — ABNORMAL HIGH (ref 1.7–7.7)
Neutrophils Relative %: 94 %
Platelets: 324 10*3/uL (ref 150–400)
RBC: 4.51 MIL/uL (ref 3.87–5.11)
RDW: 14.9 % (ref 11.5–15.5)
Smear Review: NORMAL
WBC: 28.9 10*3/uL — ABNORMAL HIGH (ref 4.0–10.5)
nRBC: 0 % (ref 0.0–0.2)

## 2023-07-10 LAB — COMPREHENSIVE METABOLIC PANEL
ALT: 22 U/L (ref 0–44)
AST: 28 U/L (ref 15–41)
Albumin: 3.5 g/dL (ref 3.5–5.0)
Alkaline Phosphatase: 157 U/L — ABNORMAL HIGH (ref 38–126)
Anion gap: 7 (ref 5–15)
BUN: 19 mg/dL (ref 8–23)
CO2: 24 mmol/L (ref 22–32)
Calcium: 9 mg/dL (ref 8.9–10.3)
Chloride: 108 mmol/L (ref 98–111)
Creatinine, Ser: 1.05 mg/dL — ABNORMAL HIGH (ref 0.44–1.00)
GFR, Estimated: 57 mL/min — ABNORMAL LOW (ref >60)
Glucose, Bld: 148 mg/dL — ABNORMAL HIGH (ref 70–99)
Potassium: 3.3 mmol/L — ABNORMAL LOW (ref 3.5–5.1)
Sodium: 139 mmol/L (ref 135–145)
Total Bilirubin: 0.8 mg/dL (ref 0.3–1.2)
Total Protein: 7.6 g/dL (ref 6.5–8.1)

## 2023-07-10 LAB — LACTIC ACID, PLASMA: Lactic Acid, Venous: 2 mmol/L (ref 0.5–1.9)

## 2023-07-10 LAB — LIPASE, BLOOD: Lipase: 25 U/L (ref 11–51)

## 2023-07-10 MED ORDER — IOHEXOL 300 MG/ML  SOLN
80.0000 mL | Freq: Once | INTRAMUSCULAR | Status: AC | PRN
  Administered 2023-07-11: 80 mL via INTRAVENOUS

## 2023-07-10 MED ORDER — SODIUM CHLORIDE 0.9 % IV BOLUS (SEPSIS)
1000.0000 mL | Freq: Once | INTRAVENOUS | Status: AC
Start: 2023-07-10 — End: 2023-07-10
  Administered 2023-07-10: 1000 mL via INTRAVENOUS

## 2023-07-10 MED ORDER — ONDANSETRON HCL 4 MG/2ML IJ SOLN
4.0000 mg | Freq: Once | INTRAMUSCULAR | Status: AC
  Administered 2023-07-10: 4 mg via INTRAVENOUS
  Filled 2023-07-10: qty 2

## 2023-07-10 MED ORDER — HYDROMORPHONE HCL 1 MG/ML IJ SOLN
0.5000 mg | Freq: Once | INTRAMUSCULAR | Status: AC
  Administered 2023-07-10: 0.5 mg via INTRAVENOUS
  Filled 2023-07-10: qty 0.5

## 2023-07-10 MED ORDER — METRONIDAZOLE 500 MG/100ML IV SOLN
500.0000 mg | Freq: Once | INTRAVENOUS | Status: AC
  Administered 2023-07-11: 500 mg via INTRAVENOUS
  Filled 2023-07-10: qty 100

## 2023-07-10 NOTE — Progress Notes (Signed)
Elink monitoring for the code sepsis protocol.  

## 2023-07-10 NOTE — Progress Notes (Signed)
CODE SEPSIS - PHARMACY COMMUNICATION  **Broad Spectrum Antibiotics should be administered within 1 hour of Sepsis diagnosis**  Time Code Sepsis Called/Page Received: 2326  Antibiotics Ordered: Flagyl  Time of 1st antibiotic administration: 0015  Otelia Sergeant, PharmD, [facility] 07/10/2023 11:31 PM

## 2023-07-10 NOTE — ED Provider Notes (Signed)
[facility] Provider Note    Event Date/Time   First MD Initiated Contact with Patient 07/10/23 2141     (approximate)   History   No chief complaint on file.   HPI  Jeanne [name] is a 72 year old female with history of C. difficile colitis, pancolitis, RA, CKD presenting to the emergency department for evaluation of abdominal pain and diarrhea.  Recently discharged for C. difficile, reports she has had this multiple times.  Felt well until a few hours ago when she had onset of nausea with about 4 episodes of watery diarrhea.  Does report some lower abdominal pain, says this feels similar to prior C. difficile episodes, denies any worsened abdominal pain.  No fevers or chills.       Physical Exam   Triage Vital Signs: ED Triage Vitals  Encounter Vitals Group     BP      Systolic BP Percentile      Diastolic BP Percentile      Pulse      Resp      Temp      Temp src      SpO2      Weight      Height      Head Circumference      Peak Flow      Pain Score      Pain Loc      Pain Education      Exclude from Growth Chart     Most recent vital signs: Vitals:   07/10/23 2130 07/10/23 2315  BP: 118/63   Pulse: (!) 111 94  Resp: (!) 8 14  SpO2: 99% 100%     General: Awake, interactive  CV:  Tachycardic with regular rhythm at time of my initial evaluation Resp:  Lungs clear to auscultation Abd:  Soft, diffuse mild tenderness to palpation worse in the lower abdomen without rebound or guarding Neuro:  Symmetric facial movement, fluid speech   ED Results / Procedures / Treatments   Labs (all labs ordered are listed, but only abnormal results are displayed) Labs Reviewed  LACTIC ACID, PLASMA - Abnormal; Notable for the following components:      Result Value   Lactic Acid, Venous 2.0 (*)    All other components within normal limits  CBC WITH DIFFERENTIAL/PLATELET - Abnormal; Notable for the following components:   WBC 28.9 (*)     All other components within normal limits  CULTURE, BLOOD (ROUTINE X 2)  CULTURE, BLOOD (ROUTINE X 2)  C DIFFICILE QUICK SCREEN W PCR REFLEX    GASTROINTESTINAL PANEL BY PCR, STOOL (REPLACES STOOL CULTURE)  LACTIC ACID, PLASMA  COMPREHENSIVE METABOLIC PANEL  URINALYSIS, W/ REFLEX TO CULTURE (INFECTION SUSPECTED)  LIPASE, BLOOD     EKG EKG independently reviewed interpreted by myself (ER attending) demonstrates:    RADIOLOGY Imaging independently reviewed and interpreted by myself demonstrates:  CT abdomen pelvis pending CXR without focal consolidation on my review, formal radiology read pending  PROCEDURES:  Critical Care performed: Yes, see critical care procedure note(s)   Procedures   MEDICATIONS ORDERED IN ED: Medications  metroNIDAZOLE (FLAGYL) IVPB 500 mg (has no administration in time range)  sodium chloride 0.9 % bolus 1,000 mL (0 mLs Intravenous Stopped 07/10/23 2316)  HYDROmorphone (DILAUDID) injection 0.5 mg (0.5 mg Intravenous Given 07/10/23 2315)  ondansetron (ZOFRAN) injection 4 mg (4 mg Intravenous Given 07/10/23 2315)     IMPRESSION / MDM / ASSESSMENT AND PLAN /  ED COURSE  I reviewed the triage vital signs and the nursing notes.  Differential diagnosis includes, but is not limited to, recurrent C. difficile colitis, UTI, other acute intra-abdominal process, antibiotic side effect  Patient's presentation is most consistent with acute presentation with potential threat to life or bodily function.  72 year old female presenting to the emergency department with abdominal pain and diarrhea.  Tachycardic on presentation here.  Ordered for fluids, Zofran, Dilaudid.  Labs with significant leukocytosis at 28.9.  Positive C. difficile test on 10/19.  Does have history of MDRO UTI, but negative urine culture during most recent admission.  Does meet sepsis criteria, has cephalosporin allergy documented, will start with empiric antibiotics with IV Flagyl and send C.  difficile and stool studies.  Remainder of labs and CT abdomen pelvis pending, ultimately anticipate admission.  Signed out to oncoming provider at 2330 pending completion of workup and disposition.     FINAL CLINICAL IMPRESSION(S) / ED DIAGNOSES   Final diagnoses:  Diarrhea of presumed infectious origin  Abdominal pain, unspecified abdominal location     Rx / DC Orders   ED Discharge Orders     None        Note:  This document was prepared using Dragon voice recognition software and may include unintentional dictation errors.   Trinna Post, MD 07/10/23 [phone]

## 2023-07-11 ENCOUNTER — Emergency Department: Payer: Medicare HMO

## 2023-07-11 ENCOUNTER — Other Ambulatory Visit (HOSPITAL_COMMUNITY): Payer: Self-pay

## 2023-07-11 ENCOUNTER — Other Ambulatory Visit: Payer: Self-pay

## 2023-07-11 DIAGNOSIS — Z8619 Personal history of other infectious and parasitic diseases: Secondary | ICD-10-CM

## 2023-07-11 DIAGNOSIS — R531 Weakness: Secondary | ICD-10-CM | POA: Diagnosis not present

## 2023-07-11 DIAGNOSIS — R110 Nausea: Secondary | ICD-10-CM | POA: Diagnosis not present

## 2023-07-11 DIAGNOSIS — Z87891 Personal history of nicotine dependence: Secondary | ICD-10-CM | POA: Diagnosis not present

## 2023-07-11 DIAGNOSIS — Z881 Allergy status to other antibiotic agents status: Secondary | ICD-10-CM | POA: Diagnosis not present

## 2023-07-11 DIAGNOSIS — Z885 Allergy status to narcotic agent status: Secondary | ICD-10-CM | POA: Diagnosis not present

## 2023-07-11 DIAGNOSIS — N132 Hydronephrosis with renal and ureteral calculous obstruction: Secondary | ICD-10-CM | POA: Diagnosis not present

## 2023-07-11 DIAGNOSIS — I129 Hypertensive chronic kidney disease with stage 1 through stage 4 chronic kidney disease, or unspecified chronic kidney disease: Secondary | ICD-10-CM | POA: Diagnosis not present

## 2023-07-11 DIAGNOSIS — A0471 Enterocolitis due to Clostridium difficile, recurrent: Secondary | ICD-10-CM | POA: Diagnosis not present

## 2023-07-11 DIAGNOSIS — Z923 Personal history of irradiation: Secondary | ICD-10-CM | POA: Diagnosis not present

## 2023-07-11 DIAGNOSIS — Z79899 Other long term (current) drug therapy: Secondary | ICD-10-CM | POA: Diagnosis not present

## 2023-07-11 DIAGNOSIS — N1832 Chronic kidney disease, stage 3b: Secondary | ICD-10-CM | POA: Diagnosis not present

## 2023-07-11 DIAGNOSIS — A419 Sepsis, unspecified organism: Secondary | ICD-10-CM | POA: Diagnosis not present

## 2023-07-11 DIAGNOSIS — Z96651 Presence of right artificial knee joint: Secondary | ICD-10-CM | POA: Diagnosis not present

## 2023-07-11 DIAGNOSIS — Z803 Family history of malignant neoplasm of breast: Secondary | ICD-10-CM | POA: Diagnosis not present

## 2023-07-11 DIAGNOSIS — Z8541 Personal history of malignant neoplasm of cervix uteri: Secondary | ICD-10-CM | POA: Diagnosis not present

## 2023-07-11 DIAGNOSIS — K529 Noninfective gastroenteritis and colitis, unspecified: Secondary | ICD-10-CM

## 2023-07-11 DIAGNOSIS — E872 Acidosis, unspecified: Secondary | ICD-10-CM | POA: Diagnosis not present

## 2023-07-11 DIAGNOSIS — J449 Chronic obstructive pulmonary disease, unspecified: Secondary | ICD-10-CM | POA: Diagnosis not present

## 2023-07-11 DIAGNOSIS — R197 Diarrhea, unspecified: Secondary | ICD-10-CM | POA: Diagnosis not present

## 2023-07-11 DIAGNOSIS — K8689 Other specified diseases of pancreas: Secondary | ICD-10-CM

## 2023-07-11 DIAGNOSIS — Z743 Need for continuous supervision: Secondary | ICD-10-CM | POA: Diagnosis not present

## 2023-07-11 DIAGNOSIS — Z90710 Acquired absence of both cervix and uterus: Secondary | ICD-10-CM | POA: Diagnosis not present

## 2023-07-11 DIAGNOSIS — Z7401 Bed confinement status: Secondary | ICD-10-CM | POA: Diagnosis not present

## 2023-07-11 DIAGNOSIS — Z96643 Presence of artificial hip joint, bilateral: Secondary | ICD-10-CM | POA: Diagnosis not present

## 2023-07-11 DIAGNOSIS — K838 Other specified diseases of biliary tract: Secondary | ICD-10-CM | POA: Diagnosis not present

## 2023-07-11 DIAGNOSIS — D72829 Elevated white blood cell count, unspecified: Secondary | ICD-10-CM

## 2023-07-11 DIAGNOSIS — E785 Hyperlipidemia, unspecified: Secondary | ICD-10-CM | POA: Diagnosis not present

## 2023-07-11 DIAGNOSIS — I2510 Atherosclerotic heart disease of native coronary artery without angina pectoris: Secondary | ICD-10-CM | POA: Diagnosis not present

## 2023-07-11 DIAGNOSIS — R161 Splenomegaly, not elsewhere classified: Secondary | ICD-10-CM | POA: Diagnosis not present

## 2023-07-11 DIAGNOSIS — M0800 Unspecified juvenile rheumatoid arthritis of unspecified site: Secondary | ICD-10-CM | POA: Diagnosis not present

## 2023-07-11 DIAGNOSIS — A0472 Enterocolitis due to Clostridium difficile, not specified as recurrent: Secondary | ICD-10-CM | POA: Diagnosis not present

## 2023-07-11 DIAGNOSIS — R5381 Other malaise: Secondary | ICD-10-CM | POA: Diagnosis not present

## 2023-07-11 DIAGNOSIS — Z8249 Family history of ischemic heart disease and other diseases of the circulatory system: Secondary | ICD-10-CM | POA: Diagnosis not present

## 2023-07-11 DIAGNOSIS — Z888 Allergy status to other drugs, medicaments and biological substances status: Secondary | ICD-10-CM | POA: Diagnosis not present

## 2023-07-11 LAB — C DIFFICILE QUICK SCREEN W PCR REFLEX
C Diff antigen: NEGATIVE
C Diff interpretation: NOT DETECTED
C Diff toxin: NEGATIVE

## 2023-07-11 LAB — GASTROINTESTINAL PANEL BY PCR, STOOL (REPLACES STOOL CULTURE)

## 2023-07-11 LAB — MAGNESIUM: Magnesium: 1.6 mg/dL — ABNORMAL LOW (ref 1.7–2.4)

## 2023-07-11 LAB — LACTIC ACID, PLASMA: Lactic Acid, Venous: 1.5 mmol/L (ref 0.5–1.9)

## 2023-07-11 MED ORDER — ACETAMINOPHEN 325 MG PO TABS
650.0000 mg | ORAL_TABLET | Freq: Four times a day (QID) | ORAL | Status: DC | PRN
  Filled 2023-07-11: qty 2

## 2023-07-11 MED ORDER — ENOXAPARIN SODIUM 40 MG/0.4ML IJ SOSY
40.0000 mg | PREFILLED_SYRINGE | INTRAMUSCULAR | Status: DC
  Administered 2023-07-11 – 2023-07-13 (×3): 40 mg via SUBCUTANEOUS
  Filled 2023-07-11 (×3): qty 0.4

## 2023-07-11 MED ORDER — MORPHINE SULFATE (PF) 2 MG/ML IV SOLN
2.0000 mg | INTRAVENOUS | Status: DC | PRN
  Administered 2023-07-11 – 2023-07-12 (×4): 2 mg via INTRAVENOUS
  Filled 2023-07-11 (×5): qty 1

## 2023-07-11 MED ORDER — POTASSIUM CHLORIDE CRYS ER 20 MEQ PO TBCR
40.0000 meq | EXTENDED_RELEASE_TABLET | Freq: Once | ORAL | Status: AC
  Administered 2023-07-11: 40 meq via ORAL
  Filled 2023-07-11: qty 2

## 2023-07-11 MED ORDER — OXYCODONE HCL 5 MG PO TABS: 5.0000 mg | ORAL_TABLET | Freq: Four times a day (QID) | ORAL | Status: DC | PRN

## 2023-07-11 MED ORDER — BOOST PLUS PO LIQD
237.0000 mL | Freq: Three times a day (TID) | ORAL | Status: DC
  Administered 2023-07-12 (×2): 237 mL via ORAL
  Filled 2023-07-11: qty 237

## 2023-07-11 MED ORDER — ONDANSETRON HCL 4 MG/2ML IJ SOLN
4.0000 mg | Freq: Four times a day (QID) | INTRAMUSCULAR | Status: DC | PRN
  Administered 2023-07-11 (×2): 4 mg via INTRAVENOUS
  Filled 2023-07-11 (×3): qty 2

## 2023-07-11 MED ORDER — ONDANSETRON HCL 4 MG PO TABS: 4.0000 mg | ORAL_TABLET | Freq: Four times a day (QID) | ORAL | Status: DC | PRN

## 2023-07-11 MED ORDER — RISAQUAD PO CAPS
1.0000 | ORAL_CAPSULE | Freq: Every day | ORAL | Status: DC
  Administered 2023-07-11 – 2023-07-14 (×4): 1 via ORAL
  Filled 2023-07-11 (×4): qty 1

## 2023-07-11 MED ORDER — PANCRELIPASE (LIP-PROT-AMYL) 12000-38000 UNITS PO CPEP
36000.0000 [IU] | ORAL_CAPSULE | Freq: Three times a day (TID) | ORAL | Status: DC
  Administered 2023-07-11 – 2023-07-14 (×9): 36000 [IU] via ORAL
  Filled 2023-07-11 (×6): qty 3
  Filled 2023-07-11: qty 1
  Filled 2023-07-11 (×3): qty 3

## 2023-07-11 MED ORDER — ACETAMINOPHEN 325 MG PO TABS
650.0000 mg | ORAL_TABLET | Freq: Once | ORAL | Status: DC
  Filled 2023-07-11: qty 2

## 2023-07-11 MED ORDER — VANCOMYCIN HCL 125 MG PO CAPS
125.0000 mg | ORAL_CAPSULE | Freq: Four times a day (QID) | ORAL | Status: DC
  Filled 2023-07-11 (×2): qty 1

## 2023-07-11 MED ORDER — FIDAXOMICIN 200 MG PO TABS
200.0000 mg | ORAL_TABLET | Freq: Two times a day (BID) | ORAL | Status: DC
  Administered 2023-07-11 – 2023-07-14 (×7): 200 mg via ORAL
  Filled 2023-07-11 (×7): qty 1

## 2023-07-11 MED ORDER — KETOROLAC TROMETHAMINE 15 MG/ML IJ SOLN
15.0000 mg | Freq: Once | INTRAMUSCULAR | Status: DC
  Filled 2023-07-11: qty 1

## 2023-07-11 MED ORDER — ACETAMINOPHEN 650 MG RE SUPP: 650.0000 mg | Freq: Four times a day (QID) | RECTAL | Status: DC | PRN

## 2023-07-11 MED ORDER — LOPERAMIDE HCL 2 MG PO CAPS
2.0000 mg | ORAL_CAPSULE | Freq: Three times a day (TID) | ORAL | Status: DC | PRN
  Administered 2023-07-12 – 2023-07-14 (×4): 2 mg via ORAL
  Filled 2023-07-11 (×4): qty 1

## 2023-07-11 NOTE — TOC Benefit Eligibility Note (Signed)
Patient Product/process development scientist completed.    The patient is insured through [employer]. Patient has Medicare and is not eligible for a copay card, but may be able to apply for patient assistance, if available.    Ran test claim for Dificid 200 mg and the current 10 day co-pay is $1,318.99.   This test claim was processed through [facility]- copay amounts may vary at other pharmacies due to pharmacy/plan contracts, or as the patient moves through the different stages of their insurance plan.     Roland Earl, CPHT Pharmacy Technician III Certified Patient Advocate [facility] Patient Advocate Team Direct Number: [phone]  Fax: [phone]

## 2023-07-11 NOTE — ED Notes (Signed)
Pericare provided brief changed chux changed pt repositioned for comfort

## 2023-07-11 NOTE — ED Notes (Signed)
Pt provided cranberry juice per pt request pt swallowing without apparent difficulty

## 2023-07-11 NOTE — TOC Initial Note (Addendum)
Transition of Care [facility]) - Initial/Assessment Note    Patient Details  Name: Jeanne [name] MRN: [id number] Date of Birth: 11/22/1950  Transition of Care [facility]) CM/SW Contact:    Marquita Palms, LCSW Phone Number: 07/11/2023, 12:35 PM  Clinical Narrative:                  CSW spoke with husband Selena Batten, giving permission by patient.Patient lives at home with spouse. Patient used a Home health agency in the past, but husband can not recall the name. He reports if she needs services again he would like to use that company. CSW confirmed in [place] home health. CSW will reach out if services needed per MD. Patient resting with no other needs at this time,  Expected Discharge Plan: Home w Home Health Services Barriers to Discharge: Continued Medical Work up   Patient Goals and CMS Choice Patient states their goals for this hospitalization and ongoing recovery are:: contiue to get well CMS Medicare.gov Compare Post Acute Care list provided to:: Patient Choice offered to / list presented to : Patient      Expected Discharge Plan and Services In-house Referral: Clinical Social Work Discharge Planning Services: NA Post Acute Care Choice: Home Health Living arrangements for the past 2 months: Single Family Home                           HH Arranged:  ([facility] in place) HH Agency: [facility]        Prior Living Arrangements/Services Living arrangements for the past 2 months: Single Family Home Lives with:: Significant Other Patient language and need for interpreter reviewed:: No Do you feel safe going back to the place where you live?: Yes      Need for Family Participation in Patient Care: Yes (Comment) Care giver support system in place?: Yes (comment) Current home services: Home RN, Homehealth aide Criminal Activity/Legal Involvement Pertinent to Current Situation/Hospitalization: No - Comment as needed  Activities of Daily Living      Permission  Sought/Granted Permission sought to share information with : Case Manager Permission granted to share information with : Yes, Release of Information Signed  Share Information with NAME: Gabreil Amsbaugh  Permission granted to share info w AGENCY: yes  Permission granted to share info w Relationship: na  Permission granted to share info w Contact Information: na  Emotional Assessment Appearance:: Disheveled Attitude/Demeanor/Rapport: Gracious Affect (typically observed): Calm Orientation: : Oriented to Self, Oriented to Place, Oriented to  Time, Oriented to Situation Alcohol / Substance Use: Never Used Psych Involvement: No (comment)  Admission diagnosis:  Sepsis due to undetermined organism (HCC) [A41.9] C. difficile colitis [A04.72] Patient Active Problem List   Diagnosis Date Noted   Sepsis due to undetermined organism (HCC) 07/11/2023   Severe sepsis (HCC) 06/30/2023   C. 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) 02/27/2023   Elevated alkaline phosphatase level 02/11/2023   SIRS (systemic inflammatory response syndrome) (HCC) 02/11/2023   Diarrhea of presumed infectious origin 02/11/2023   Colitis due to Campylobacter species 02/10/2023   Asymptomatic bacteriuria 02/10/2023   Difficulty walking 02/10/2023   Hypomagnesemia 01/08/2023   Hypokalemia 01/08/2023   CKD stage 3b, GFR 30-44 ml/min (HCC) 01/03/2023   Hypotension 01/02/2023   Pancolitis (HCC) 12/29/2022   Shock circulatory (HCC) 12/28/2022   Severe sepsis with septic shock (HCC) 11/10/2022   Nausea vomiting and diarrhea 11/09/2022   Clostridium difficile  diarrhea 11/09/2022   COVID-19 virus infection 11/09/2022   AKI (acute kidney injury) (HCC) 11/09/2022   Anemia 11/09/2022   Fungal infection of skin 09/23/2019   UTI (urinary tract infection) 07/15/2017   Dysuria 01/28/2017   Chronic radiation cystitis 02/28/2015   Coitalgia 02/28/2015   Blood pressure elevated 02/28/2015    Calculus of kidney 02/28/2015   Arthritis or polyarthritis, rheumatoid (HCC) 02/28/2015   Basal cell papilloma 02/28/2015   H/O cataract extraction 09/22/2014   Combined form of senile cataract 07/07/2014   NS (nuclear sclerosis) 05/21/2014   History of repair of hip joint 03/16/2013   H/O total knee replacement 03/16/2013   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 [place] [address] Phone: [phone] Fax: [phone]  [facility] [address] [place] [address] Phone: [phone] Fax: [phone]     Social Determinants of Health (SDOH) Social History: SDOH Screenings   Food Insecurity: No Food Insecurity (07/01/2023)  Housing: Low Risk  (07/01/2023)  Transportation Needs: No Transportation Needs (07/01/2023)  Utilities: Not At Risk (07/01/2023)  Alcohol Screen: Low Risk  (08/10/2020)  Depression (PHQ2-9): Low Risk  (08/10/2020)  Financial Resource Strain: Low Risk  (04/24/2022)   Received from [facility], [facility]  Physical Activity: Inactive (08/10/2020)  Social Connections: Moderately Integrated (08/10/2020)  Stress: No Stress Concern Present (08/10/2020)  Tobacco Use: Medium Risk (07/01/2023)   SDOH Interventions:     Readmission Risk Interventions    07/11/2023   12:28 PM 07/01/2023   10:31 AM 03/01/2023   10:56 AM  Readmission Risk Prevention Plan  Transportation Screening Complete Complete Complete  PCP or Specialist Appt within 3-5 Days Complete  Complete  HRI or Home Care Consult Complete  Complete  Social Work Consult for Recovery Care Planning/Counseling Complete  Complete  Palliative Care Screening Complete  Not Applicable  Medication Review Oceanographer) Complete  Complete  PCP or Specialist appointment within 3-5 days of  discharge  Complete   HRI or Home Care Consult  Complete   SW Recovery Care/Counseling Consult  Complete   Palliative Care Screening  Not Applicable   Skilled Nursing Facility  Not Applicable

## 2023-07-11 NOTE — Consult Note (Signed)
Arlyss Repress, MD [address]  [address]  [place], [place] [address]  Main: [phone]  Fax: [phone] Pager: [phone]   Consultation  Referring Provider:     No ref. provider found Primary Care Physician:  Jerl Mina, MD Primary Gastroenterologist:  Dr. Norma Fredrickson         Reason for Consultation: Chronic diarrhea, history of C. difficile  Date of Admission:  07/10/2023 Date of Consultation:  07/11/2023         HPI:   Jeanne [name] is a 72 y.o. female history of cervical cancer, juvenile rheumatoid arthritis, hypertension, CKD, history of C. difficile infection, history of Campylobacter infection.  She was admitted to [facility] in June 2024 for recurrent of diarrhea, stool studies were negative for infection including ova and parasites.  She was empirically treated for acute infectious diarrhea with azithromycin.  Patient was readmitted to [facility] on 06/29/2023 to 07/05/2023 secondary to abdominal pain and recurrence of diarrhea, found to have significant leukocytosis, C. difficile antigen as well as toxigenic C. difficile by PCR came back positive, she was treated with vancomycin taper.  She returned to ER last night secondary to recurrence of watery diarrhea and nausea associated with some lower abdominal pain, significant leukocytosis WBC count 28.9, mildly elevated lactic acid.  She started on oral vancomycin and GI is consulted for further evaluation GI pathogen panel as well as C. difficile screen came back negative today. CT abdomen and pelvis in the ER did not reveal any acute intra-abdominal pathology other than fatty atrophy of the pancreas Patient does admit to drinking sweet tea daily as well as soda on a regular basis  NSAIDs: None  Antiplts/Anticoagulants/Anti thrombotics: None  GI Procedures: None  Past Medical History:  Diagnosis Date   Bedbound    since R hip surgery, cannot bear weight on R leg. since 07/02/22. Has not walked since then   Cervical  cancer (HCC)    CKD stage 3b, GFR 30-44 ml/min (HCC)    Complication of anesthesia    History of kidney stones    Hypertension    PONV (postoperative nausea and vomiting)    rheumatoidArthritis    RA    Past Surgical History:  Procedure Laterality Date   ABDOMINAL HYSTERECTOMY  09/10/2003   CYSTOGRAM N/A 08/04/2018   Procedure: CYSTOGRAM WITH URETHAL DILATION;  Surgeon: Vanna Scotland, MD;  Location: [facility] ORS;  Service: Urology;  Laterality: N/A;   CYSTOSCOPY W/ RETROGRADES Bilateral 08/04/2018   Procedure: CYSTOSCOPY WITH RETROGRADE PYELOGRAM;  Surgeon: Vanna Scotland, MD;  Location: [facility] OR[facility];  Service: Urology;  Laterality: Bilateral;   CYSTOSCOPY/URETEROSCOPY/HOLMIUM LASER/STENT PLACEMENT Left 08/04/2018   Procedure: CYSTOSCOPY/URETEROSCOPY/HOLMIUM LASER/STENT PLACEMENT;  Surgeon: Vanna Scotland, MD;  Location: [facility] ORS;  Service: Urology;  Laterality: Left;   JOINT REPLACEMENT     TOTAL HIP ARTHROPLASTY Right 1972   TOTAL HIP ARTHROPLASTY Left 1981   TOTAL KNEE ARTHROPLASTY Right 1990     Current Facility-Administered Medications:    acetaminophen (TYLENOL) tablet 650 mg, 650 mg, Oral, Q6H PRN **OR** acetaminophen (TYLENOL) suppository 650 mg, 650 mg, Rectal, Q6H PRN, Mikey College T, MD   acetaminophen (TYLENOL) tablet 650 mg, 650 mg, Oral, Once, Pilar Jarvis, MD   acidophilus (RISAQUAD) capsule 1 capsule, 1 capsule, Oral, Daily, Mikey College T, MD, 1 capsule at 07/11/23 0939   enoxaparin (LOVENOX) injection 40 mg, 40 mg, Subcutaneous, Q24H, Zhang, Ping T, MD   fidaxomicin (DIFICID) tablet 200 mg, 200 mg, Oral, BID, Chipper Herb,  Renae Fickle, MD, 200 mg at 07/11/23 6578   ketorolac (TORADOL) 15 MG/ML injection 15 mg, 15 mg, Intravenous, Once, Pilar Jarvis, MD   lipase/protease/amylase (CREON) capsule 36,000 Units, 36,000 Units, Oral, TID WC, Taunja Brickner, Loel Dubonnet, MD   loperamide (IMODIUM) capsule 2 mg, 2 mg, Oral, Q8H PRN, Danetta Prom, Loel Dubonnet, MD   morphine (PF) 2 MG/ML injection 2 mg, 2 mg,  Intravenous, Q4H PRN, Mansy, Jan A, MD, 2 mg at 07/11/23 0624   ondansetron (ZOFRAN) tablet 4 mg, 4 mg, Oral, Q6H PRN **OR** ondansetron (ZOFRAN) injection 4 mg, 4 mg, Intravenous, Q6H PRN, Mikey College T, MD, 4 mg at 07/11/23 4696   oxyCODONE (Oxy IR/ROXICODONE) immediate release tablet 5 mg, 5 mg, Oral, Q6H PRN, Emeline General, MD  Current Outpatient Medications:    acidophilus (RISAQUAD) CAPS capsule, Take 1 capsule by mouth daily., Disp: 30 capsule, Rfl: 0   conjugated estrogens (PREMARIN) vaginal cream, Place vaginally 3 (three) times a week., Disp: 42.5 g, Rfl: 12   gabapentin (NEURONTIN) 100 MG capsule, Take 100 mg by mouth at bedtime., Disp: , Rfl:    Nystatin (GERHARDT'S BUTT CREAM) CREA, Apply 1 Application topically 3 (three) times daily., Disp: 90 each, Rfl: 0   oxyCODONE (OXY IR/ROXICODONE) 5 MG immediate release tablet, Take 1 tablet (5 mg total) by mouth every 6 (six) hours as needed., Disp: 15 tablet, Rfl: 0   promethazine (PHENERGAN) 25 MG tablet, Take 12.5 mg by mouth every 6 (six) hours as needed for nausea., Disp: , Rfl:    vancomycin (VANCOCIN) 125 MG capsule, Take 1 capsule (125 mg total) by mouth 4 (four) times daily for 12 days, THEN 1 capsule (125 mg total) 2 (two) times daily for 7 days, THEN 1 capsule (125 mg total) daily for 7 days, THEN 1 capsule (125 mg total) every other day for 28 days, THEN 1 capsule (125 mg total) every 3 (three) days for 28 days., Disp: 92 capsule, Rfl: 0   diclofenac (VOLTAREN) 75 MG EC tablet, Take 75 mg by mouth 2 (two) times daily. (Patient not taking: Reported on 07/11/2023), Disp: , Rfl:    Family History  Problem Relation Age of Onset   Breast cancer Mother    Hypertension Father    Cancer Father    Prostate cancer Neg Hx    Kidney cancer Neg Hx    Bladder Cancer Neg Hx      Social History   Tobacco Use   Smoking status: Former    Current packs/day: 0.00    Types: Cigarettes    Start date: 09/09/1969    Quit date: 09/09/1989     Years since quitting: 33.8   Smokeless tobacco: Never   Tobacco comments:    QUIT IN 1990  Vaping Use   Vaping status: Never Used  Substance Use Topics   Alcohol use: No    Alcohol/week: 0.0 standard drinks of alcohol   Drug use: No    Allergies as of 07/10/2023 - Review Complete 07/10/2023  Allergen Reaction Noted   Codeine Nausea Only and Nausea And Vomiting 03/01/2015   Augmentin [amoxicillin-pot clavulanate] Nausea Only 08/22/2018   Ceftriaxone Other (See Comments) 06/13/2022    Review of Systems:    All systems reviewed and negative except where noted in HPI.   Physical Exam:  Vital signs in last 24 hours: Temp:  [98.3 F (36.8 C)] 98.3 F (36.8 C) (10/31 0126) Pulse Rate:  [88-111] 95 (10/31 1530) Resp:  [8-21] 19 (10/31 1530)  BP: (106-133)/(60-76) 108/62 (10/31 1530) SpO2:  [96 %-100 %] 97 % (10/31 1530) Weight:  [62.1 kg] 62.1 kg (10/31 0125)   General:   Pleasant, cooperative in NAD Head:  Normocephalic and atraumatic. Eyes:   No icterus.   Conjunctiva pink. PERRLA. Ears:  Normal auditory acuity. Neck:  Supple; no masses or thyroidomegaly Lungs: Respirations even and unlabored. Lungs clear to auscultation bilaterally.   No wheezes, crackles, or rhonchi.  Heart:  Regular rate and rhythm;  Without murmur, clicks, rubs or gallops Abdomen:  Soft, nondistended, nontender. Normal bowel sounds. No appreciable masses or hepatomegaly.  No rebound or guarding.  Rectal:  Not performed. Msk:  Symmetrical without gross deformities.  Strength generalized weakness Extremities:  Without edema, cyanosis or clubbing. Neurologic:  Alert and oriented x3;  grossly normal neurologically. Skin:  Intact without significant lesions or rashes. Psych:  Alert and cooperative. Normal affect.  LAB RESULTS:    Latest Ref Rng & Units 07/10/2023   10:49 PM 07/04/2023   11:33 AM 07/03/2023    4:52 AM  CBC  WBC 4.0 - 10.5 K/uL 28.9  5.3  4.4   Hemoglobin 12.0 - 15.0 g/dL 65.7  84.6   96.2   Hematocrit 36.0 - 46.0 % 40.1  32.6  36.5   Platelets 150 - 400 K/uL 324  253  240     BMET    Latest Ref Rng & Units 07/10/2023   10:49 PM 07/04/2023   11:33 AM 07/03/2023    4:52 AM  BMP  Glucose 70 - 99 mg/dL 952  99  841   BUN 8 - 23 mg/dL 19  8  15    Creatinine 0.44 - 1.00 mg/dL 3.24  4.01  0.27   Sodium 135 - 145 mmol/L 139  140  137   Potassium 3.5 - 5.1 mmol/L 3.3  3.5  3.3   Chloride 98 - 111 mmol/L 108  110  109   CO2 22 - 32 mmol/L 24  23  21    Calcium 8.9 - 10.3 mg/dL 9.0  8.2  8.4     LFT    Latest Ref Rng & Units 07/10/2023   10:49 PM 06/29/2023    8:17 PM 05/26/2023    9:32 AM  Hepatic Function  Total Protein 6.5 - 8.1 g/dL 7.6  7.1  4.9   Albumin 3.5 - 5.0 g/dL 3.5  3.2  2.3   AST 15 - 41 U/L 28  23  15    ALT 0 - 44 U/L 22  17  12    Alk Phosphatase 38 - 126 U/L 157  165  84   Total Bilirubin 0.3 - 1.2 mg/dL 0.8  1.0  1.0      STUDIES: CT ABDOMEN PELVIS W CONTRAST  Result Date: 07/11/2023 CLINICAL DATA:  Lower abdominal pain. Recent C diff infection. Diarrhea. EXAM: CT ABDOMEN AND PELVIS WITH CONTRAST TECHNIQUE: Multidetector CT imaging of the abdomen and pelvis was performed using the standard protocol following bolus administration of intravenous contrast. RADIATION DOSE REDUCTION: This exam was performed according to the departmental dose-optimization program which includes automated exposure control, adjustment of the mA and/or kV according to patient size and/or use of iterative reconstruction technique. CONTRAST:  80mL OMNIPAQUE IOHEXOL 300 MG/ML  SOLN COMPARISON:  CT abdomen and pelvis 06/29/2023 FINDINGS: Lower chest: No acute abnormality. Hepatobiliary: Mild hepatic steatosis. Layering sludge in the gallbladder. No cholecystitis or biliary dilation. Pancreas: Fatty atrophy without acute abnormality. Spleen: Mild splenomegaly. Adrenals/Urinary Tract: Stable  adrenal glands. Left greater than right renal parenchymal atrophy. Prominent parapelvic  cysts bilaterally. No follow-up recommended. Nonobstructing punctate right nephrolithiasis. Unchanged left hydronephrosis and hydroureter without obstructing calculus. Streak artifact limits assessment of the distal ureter an obstructing mass is not excluded. Unremarkable bladder were visualized. Stomach/Bowel: The rectum is not well evaluated due to streak artifact. Normal caliber large and small bowel without bowel wall thickening. Stomach is within normal limits. The appendix is not definitively visualized. No secondary signs of appendicitis. Vascular/Lymphatic: Aortic atherosclerosis. No enlarged abdominal or pelvic lymph nodes. Reproductive: Streak artifact limits assessment.  Hysterectomy. Other: Question trace free fluid in the pelvis. No free intraperitoneal air. Musculoskeletal: Demineralization. Bilateral THAs. Streak artifact limits assessment for acute fracture. A screw of the right THA extends into the right pelvis and exerts mass effect on the right external iliac artery and vein. Unchanged compression fractures of T12, L1, and L5. Probable sacral insufficiency fracture. Edema or contusion in both flanks. IMPRESSION: 1. No acute abnormality in the abdomen or pelvis. 2. Unchanged left hydronephrosis and hydroureter without obstructing calculus. Streak artifact limits assessment of the distal ureter and obstructing mass is not excluded. 3. Nonobstructing punctate right nephrolithiasis. 4. Mild hepatic steatosis. 5. Mild splenomegaly. 6. Probable sacral insufficiency fracture. Aortic Atherosclerosis (ICD10-I70.0). Electronically Signed   By: Minerva Fester M.D.   On: 07/11/2023 04:09   DG Chest Port 1 View  Result Date: 07/10/2023 CLINICAL DATA:  Questionable sepsis EXAM: PORTABLE CHEST 1 VIEW COMPARISON:  Chest x-ray 05/01/2022 FINDINGS: The heart size and mediastinal contours are within normal limits. Calcified right paratracheal lymph node again noted. Both lungs are clear. T there are  degenerative changes of both shoulders. He visualized skeletal structures are unremarkable. IMPRESSION: No active disease. Electronically Signed   By: Darliss Cheney M.D.   On: 07/10/2023 23:18      Impression / Plan:   Jeanne [name] is a 72 y.o. female with stage III CKD, hypertension not on any antihypertensives, history of recurrent C. difficile infections within last 1 year, treated with oral vancomycin as well as vancomycin taper, most recent episode was last week, discharged on oral vancomycin readmitted with acute diarrhea, nausea and lower abdominal pain with leukocytosis  Chronic diarrhea with history of C. difficile infection CT abdomen and pelvis did not reveal any toxic megacolon GI profile PCR and C. difficile toxin, antigen came back negative during this admission CT revealed fatty atrophic pancreas, recommend to check pancreatic fecal elastase levels Start Creon 36K capsules with each meal Differentials include osmotic diarrhea or postinfectious IBS diarrhea or microscopic colitis or bacterial overgrowth or lactose intolerance Okay to start Imodium or Lomotil every 6-8 hours Patient is eligible for fecal microbiota given history of recurrent C. difficile infections and this can be pursued as outpatient Diet as tolerated, recommend lactose-free diet Monitor renal function, electrolytes and replete as needed  Thank you for involving me in the care of this patient.      LOS: 0 days   Lannette Donath, MD  07/11/2023, 4:12 PM    Note: This dictation was prepared with Dragon dictation along with smaller phrase technology. Any transcriptional errors that result from this process are unintentional.

## 2023-07-11 NOTE — ED Notes (Signed)
Pt provided pericare brief changed chux changed pillow provided for comfort

## 2023-07-11 NOTE — ED Provider Notes (Signed)
Signed out pending CT abdomen and pelvis prior to hospital admission barring any surgical pathologies.  CT scan unchanged from prior, patient with C. difficile colitis, abdominal pain, profuse diarrhea, with vital sign abnormalities concern for sepsis.  Admission.   Pilar Jarvis, MD 07/11/23 [phone]

## 2023-07-11 NOTE — H&P (Signed)
History and Physical    Jeanne [name] ZOX:[id number] DOB: [birth year] DOA: 07/10/2023  PCP: Jerl Mina, MD (Confirm with patient/family/NH records and if not entered, this has to be entered at [facility] point of entry) Patient coming from: Home  I have personally briefly reviewed patient's old medical records in [facility]  Chief Complaint: Belly hurts, diarrhea  HPI: Jeanne [name] is a 72 y.o. female with medical history significant of recurrent C. difficile colitis, rheumatoid arthritis, HTN, CKD stage IIIb with chronic left-sided hydronephrosis, cervical cancer status post radiation therapy, presented with sudden onset of abdominal pain and diarrhea.  Patient was recently diagnosed with recurrent C. difficile colitis was discharged on tapering dose of vancomycin on last Friday, as of yesterday patient is on 125 mg every 6 hours.  Yesterday evening, patient started to have cramping-like abdominal pain and followed by 4 episode of moderate amount of watery diarrhea, denied any tenesmus.  No fever chills no nauseous vomiting.  ED Course: Afebrile, borderline tachycardia, blood pressure 120/60, blood work showed WBC 28.9, hemoglobin 13, BUN 19, creatinine 1.0, K3.3, bicarb 24.  CT abdomen pelvis showed no acute abnormalities in abdomen.  Unchanged left hydronephrosis and hydroureter without obstructing calculus.  Review of Systems: As per HPI otherwise 14 point review of systems negative.   Past Medical History:  Diagnosis Date   Bedbound    since R hip surgery, cannot bear weight on R leg. since 07/02/22. Has not walked since then   Cervical cancer (HCC)    CKD stage 3b, GFR 30-44 ml/min (HCC)    Complication of anesthesia    History of kidney stones    Hypertension    PONV (postoperative nausea and vomiting)    rheumatoidArthritis    RA    Past Surgical History:  Procedure Laterality Date   ABDOMINAL HYSTERECTOMY  09/10/2003   CYSTOGRAM N/A 08/04/2018   Procedure:  CYSTOGRAM WITH URETHAL DILATION;  Surgeon: Vanna Scotland, MD;  Location: [facility] ORS;  Service: Urology;  Laterality: N/A;   CYSTOSCOPY W/ RETROGRADES Bilateral 08/04/2018   Procedure: CYSTOSCOPY WITH RETROGRADE PYELOGRAM;  Surgeon: Vanna Scotland, MD;  Location: [facility] ORS;  Service: Urology;  Laterality: Bilateral;   CYSTOSCOPY/URETEROSCOPY/HOLMIUM LASER/STENT PLACEMENT Left 08/04/2018   Procedure: CYSTOSCOPY/URETEROSCOPY/HOLMIUM LASER/STENT PLACEMENT;  Surgeon: Vanna Scotland, MD;  Location: [facility] ORS;  Service: Urology;  Laterality: Left;   JOINT REPLACEMENT     TOTAL HIP ARTHROPLASTY Right 1972   TOTAL HIP ARTHROPLASTY Left 1981   TOTAL KNEE ARTHROPLASTY Right 1990     reports that she quit smoking about 33 years ago. Her smoking use included cigarettes. She started smoking about 53 years ago. She has never used smokeless tobacco. She reports that she does not drink alcohol and does not use drugs.  Allergies  Allergen Reactions   Codeine Nausea Only and Nausea And Vomiting   Augmentin [Amoxicillin-Pot Clavulanate] Nausea Only    Very nauseated/ feels sore on body    Ceftriaxone Other (See Comments)    "Pins and needles" sensation in legs    Family History  Problem Relation Age of Onset   Breast cancer Mother    Hypertension Father    Cancer Father    Prostate cancer Neg Hx    Kidney cancer Neg Hx    Bladder Cancer Neg Hx      Prior to Admission medications   Medication Sig Start Date End Date Taking? Authorizing Provider  acidophilus (RISAQUAD) CAPS capsule Take 1 capsule by mouth daily. 07/06/23 08/05/23  Lucile Shutters, MD  conjugated estrogens (PREMARIN) vaginal cream Place vaginally 3 (three) times a week. 07/08/23   Agbata, Tochukwu, MD  gabapentin (NEURONTIN) 100 MG capsule Take 100 mg by mouth at bedtime. 10/16/22 10/16/23  [provider]  Nystatin (GERHARDT'S BUTT CREAM) CREA Apply 1 Application topically 3 (three) times daily. 07/05/23 08/04/23  Agbata,  Elwyn Lade, MD  oxyCODONE (OXY IR/ROXICODONE) 5 MG immediate release tablet Take 1 tablet (5 mg total) by mouth every 6 (six) hours as needed. 01/19/23   Gillis Santa, MD  vancomycin (VANCOCIN) 125 MG capsule Take 1 capsule (125 mg total) by mouth 4 (four) times daily for 12 days, THEN 1 capsule (125 mg total) 2 (two) times daily for 7 days, THEN 1 capsule (125 mg total) daily for 7 days, THEN 1 capsule (125 mg total) every other day for 28 days, THEN 1 capsule (125 mg total) every 3 (three) days for 28 days. 07/05/23 09/25/23  Lucile Shutters, MD    Physical Exam: Vitals:   07/11/23 0530 07/11/23 0600 07/11/23 0630 07/11/23 0800  BP: 121/66 106/68 126/73 122/68  Pulse: 96 95 (!) 104 93  Resp: 17 (!) 21 10 17   Temp:      TempSrc:      SpO2: 98% 98% 99% 100%  Weight:        Constitutional: NAD, calm, comfortable Vitals:   07/11/23 0530 07/11/23 0600 07/11/23 0630 07/11/23 0800  BP: 121/66 106/68 126/73 122/68  Pulse: 96 95 (!) 104 93  Resp: 17 (!) 21 10 17   Temp:      TempSrc:      SpO2: 98% 98% 99% 100%  Weight:       Eyes: PERRL, lids and conjunctivae normal ENMT: Mucous membranes are moist. Posterior pharynx clear of any exudate or lesions.Normal dentition.  Neck: normal, supple, no masses, no thyromegaly Respiratory: clear to auscultation bilaterally, no wheezing, no crackles. Normal respiratory effort. No accessory muscle use.  Cardiovascular: Regular rate and rhythm, no murmurs / rubs / gallops. No extremity edema. 2+ pedal pulses. No carotid bruits.  Abdomen: mild tenderness periumbilical area, no rebound no guarding, no masses palpated. No hepatosplenomegaly. Bowel sounds positive.  Musculoskeletal: no clubbing / cyanosis. No joint deformity upper and lower extremities. Good ROM, no contractures. Normal muscle tone.  Skin: no rashes, lesions, ulcers. No induration Neurologic: CN 2-12 grossly intact. Sensation intact, DTR normal. Strength 5/5 in all 4.  Psychiatric: Normal  judgment and insight. Alert and oriented x 3. Normal mood.    Labs on Admission: I have personally reviewed following labs and imaging studies  CBC: Recent Labs  Lab 07/04/23 1133 07/10/23 2249  WBC 5.3 28.9*  NEUTROABS  --  27.2*  HGB 10.5* 13.0  HCT 32.6* 40.1  MCV 88.6 88.9  PLT 253 324   Basic Metabolic Panel: Recent Labs  Lab 07/04/23 1133 07/10/23 2249  NA 140 139  K 3.5 3.3*  CL 110 108  CO2 23 24  GLUCOSE 99 148*  BUN 8 19  CREATININE 1.16* 1.05*  CALCIUM 8.2* 9.0   GFR: Estimated Creatinine Clearance: 43.1 mL/min (A) (by C-G formula based on SCr of 1.05 mg/dL (H)). Liver Function Tests: Recent Labs  Lab 07/10/23 2249  AST 28  ALT 22  ALKPHOS 157*  BILITOT 0.8  PROT 7.6  ALBUMIN 3.5   Recent Labs  Lab 07/10/23 2249  LIPASE 25   No results for input(s): "AMMONIA" in the last 168 hours. Coagulation Profile: No results for input(s): "  INR", "PROTIME" in the last 168 hours. Cardiac Enzymes: No results for input(s): "CKTOTAL", "CKMB", "CKMBINDEX", "TROPONINI" in the last 168 hours. BNP (last 3 results) No results for input(s): "PROBNP" in the last 8760 hours. HbA1C: No results for input(s): "HGBA1C" in the last 72 hours. CBG: No results for input(s): "GLUCAP" in the last 168 hours. Lipid Profile: No results for input(s): "CHOL", "HDL", "LDLCALC", "TRIG", "CHOLHDL", "LDLDIRECT" in the last 72 hours. Thyroid Function Tests: No results for input(s): "TSH", "T4TOTAL", "FREET4", "T3FREE", "THYROIDAB" in the last 72 hours. Anemia Panel: No results for input(s): "VITAMINB12", "FOLATE", "FERRITIN", "TIBC", "IRON", "RETICCTPCT" in the last 72 hours. Urine analysis:    Component Value Date/Time   COLORURINE YELLOW (A) 06/30/2023 0307   APPEARANCEUR CLOUDY (A) 06/30/2023 0307   APPEARANCEUR Cloudy (A) 10/21/2018 0841   LABSPEC 1.033 (H) 06/30/2023 0307   PHURINE 6.0 06/30/2023 0307   GLUCOSEU NEGATIVE 06/30/2023 0307   HGBUR MODERATE (A) 06/30/2023  0307   BILIRUBINUR NEGATIVE 06/30/2023 0307   BILIRUBINUR negative 07/15/2020 0827   BILIRUBINUR Negative 10/21/2018 0841   KETONESUR NEGATIVE 06/30/2023 0307   PROTEINUR NEGATIVE 06/30/2023 0307   UROBILINOGEN 0.2 07/15/2020 0827   NITRITE POSITIVE (A) 06/30/2023 0307   LEUKOCYTESUR LARGE (A) 06/30/2023 0307    Radiological Exams on Admission: CT ABDOMEN PELVIS W CONTRAST  Result Date: 07/11/2023 CLINICAL DATA:  Lower abdominal pain. Recent C diff infection. Diarrhea. EXAM: CT ABDOMEN AND PELVIS WITH CONTRAST TECHNIQUE: Multidetector CT imaging of the abdomen and pelvis was performed using the standard protocol following bolus administration of intravenous contrast. RADIATION DOSE REDUCTION: This exam was performed according to the departmental dose-optimization program which includes automated exposure control, adjustment of the mA and/or kV according to patient size and/or use of iterative reconstruction technique. CONTRAST:  80mL OMNIPAQUE IOHEXOL 300 MG/ML  SOLN COMPARISON:  CT abdomen and pelvis 06/29/2023 FINDINGS: Lower chest: No acute abnormality. Hepatobiliary: Mild hepatic steatosis. Layering sludge in the gallbladder. No cholecystitis or biliary dilation. Pancreas: Fatty atrophy without acute abnormality. Spleen: Mild splenomegaly. Adrenals/Urinary Tract: Stable adrenal glands. Left greater than right renal parenchymal atrophy. Prominent parapelvic cysts bilaterally. No follow-up recommended. Nonobstructing punctate right nephrolithiasis. Unchanged left hydronephrosis and hydroureter without obstructing calculus. Streak artifact limits assessment of the distal ureter an obstructing mass is not excluded. Unremarkable bladder were visualized. Stomach/Bowel: The rectum is not well evaluated due to streak artifact. Normal caliber large and small bowel without bowel wall thickening. Stomach is within normal limits. The appendix is not definitively visualized. No secondary signs of appendicitis.  Vascular/Lymphatic: Aortic atherosclerosis. No enlarged abdominal or pelvic lymph nodes. Reproductive: Streak artifact limits assessment.  Hysterectomy. Other: Question trace free fluid in the pelvis. No free intraperitoneal air. Musculoskeletal: Demineralization. Bilateral THAs. Streak artifact limits assessment for acute fracture. A screw of the right THA extends into the right pelvis and exerts mass effect on the right external iliac artery and vein. Unchanged compression fractures of T12, L1, and L5. Probable sacral insufficiency fracture. Edema or contusion in both flanks. IMPRESSION: 1. No acute abnormality in the abdomen or pelvis. 2. Unchanged left hydronephrosis and hydroureter without obstructing calculus. Streak artifact limits assessment of the distal ureter and obstructing mass is not excluded. 3. Nonobstructing punctate right nephrolithiasis. 4. Mild hepatic steatosis. 5. Mild splenomegaly. 6. Probable sacral insufficiency fracture. Aortic Atherosclerosis (ICD10-I70.0). Electronically Signed   By: Minerva Fester M.D.   On: 07/11/2023 04:09   DG Chest Port 1 View  Result Date: 07/10/2023 CLINICAL DATA:  Questionable sepsis  EXAM: PORTABLE CHEST 1 VIEW COMPARISON:  Chest x-ray 05/01/2022 FINDINGS: The heart size and mediastinal contours are within normal limits. Calcified right paratracheal lymph node again noted. Both lungs are clear. T there are degenerative changes of both shoulders. He visualized skeletal structures are unremarkable. IMPRESSION: No active disease. Electronically Signed   By: Darliss Cheney M.D.   On: 07/10/2023 23:18    EKG: Independently reviewed.  Sinus rhythm, no acute ST changes.  Assessment/Plan Principal Problem:   Sepsis due to undetermined organism [facility]) Active Problems:   C. difficile colitis  (please populate well all problems here in Problem List. (For example, if patient is on BP meds at home and you resume or decide to hold them, it is a problem that needs  to be her. Same for CAD, COPD, HLD and so on)  Breakthrough C. difficile colitis -Discussed with on-call infectious disease and GI attending, GI attending recommended start Dificid 200 mg twice daily -ID attending recommended recheck C. difficile PCR as well as GI pathogen panel -Continue probiotics  Leukocytosis -Secondary to breakthrough C. difficile colitis, management as above.  Chronic left-sided obstructive uropathy -Appears to be stable on today's CT scan,.  Appeared that this is chronic, etiology less clear and left-sided hydro as early as 2017, initially related to obstructed UPJ stone however later imaging studies showed no significant obstructing stones.  Recommend outpatient follow-up with urology.  DVT prophylaxis: Lovenox Code Status: Full code Family Communication: None at bedside Disposition Plan: Patient is sick with severe C. difficile colitis, requiring inpatient management and inpatient GI and ID consultation, expect more than 2 midnight hospital stay Consults called: Infectious disease and GI Admission status: MedSurg admission   Emeline General MD [facility] Pager [phone]  07/11/2023, 9:47 AM

## 2023-07-12 DIAGNOSIS — K8689 Other specified diseases of pancreas: Secondary | ICD-10-CM | POA: Diagnosis not present

## 2023-07-12 DIAGNOSIS — D72829 Elevated white blood cell count, unspecified: Secondary | ICD-10-CM | POA: Diagnosis not present

## 2023-07-12 DIAGNOSIS — Z8619 Personal history of other infectious and parasitic diseases: Secondary | ICD-10-CM | POA: Diagnosis not present

## 2023-07-12 DIAGNOSIS — K529 Noninfective gastroenteritis and colitis, unspecified: Secondary | ICD-10-CM | POA: Diagnosis not present

## 2023-07-12 DIAGNOSIS — R197 Diarrhea, unspecified: Secondary | ICD-10-CM

## 2023-07-12 LAB — CBC
HCT: 36.8 % (ref 36.0–46.0)
Hemoglobin: 11.4 g/dL — ABNORMAL LOW (ref 12.0–15.0)
MCH: 28.4 pg (ref 26.0–34.0)
MCHC: 31 g/dL (ref 30.0–36.0)
MCV: 91.5 fL (ref 80.0–100.0)
Platelets: 265 10*3/uL (ref 150–400)
RBC: 4.02 MIL/uL (ref 3.87–5.11)
RDW: 15.1 % (ref 11.5–15.5)
WBC: 8.3 10*3/uL (ref 4.0–10.5)
nRBC: 0 % (ref 0.0–0.2)

## 2023-07-12 LAB — BASIC METABOLIC PANEL
Anion gap: 8 (ref 5–15)
BUN: 13 mg/dL (ref 8–23)
CO2: 22 mmol/L (ref 22–32)
Calcium: 8.7 mg/dL — ABNORMAL LOW (ref 8.9–10.3)
Chloride: 110 mmol/L (ref 98–111)
Creatinine, Ser: 1 mg/dL (ref 0.44–1.00)
GFR, Estimated: >60 mL/min (ref >60)
Glucose, Bld: 92 mg/dL (ref 70–99)
Potassium: 4.5 mmol/L (ref 3.5–5.1)
Sodium: 140 mmol/L (ref 135–145)

## 2023-07-12 MED ORDER — OXYCODONE HCL 5 MG PO TABS
10.0000 mg | ORAL_TABLET | Freq: Four times a day (QID) | ORAL | Status: DC | PRN
  Administered 2023-07-12 – 2023-07-14 (×7): 10 mg via ORAL
  Filled 2023-07-12 (×7): qty 2

## 2023-07-12 NOTE — Progress Notes (Signed)
Jeanne Repress, MD [address]  [address]  [place], [place] [address]  Main: [phone]  Fax: [phone] Pager: [phone]   Subjective: Patient had only 1 episode of watery bowel movement this morning.  Nurse take his bedside when I saw the patient this afternoon.  She thought she had a bowel movement when she had episode of flatulence, when checked when I was in the room, there was no bowel movement.  Patient started taking pancreatic enzymes.  She had questions regarding pancreas findings on CT scan   Objective: Vital signs in last 24 hours: Vitals:   07/11/23 1735 07/11/23 2111 07/12/23 0533 07/12/23 0754  BP: (!) 118/56 (!) 113/57 (!) 116/59 119/60  Pulse: 79 83 79 87  Resp: 18 18 20 16   Temp: 98.5 F (36.9 C) 98.5 F (36.9 C) 98.3 F (36.8 C) 98.7 F (37.1 C)  TempSrc: Oral Oral Oral Oral  SpO2: 100% 99% 100% 99%  Weight:       Weight change:  No intake or output data in the 24 hours ending 07/12/23 1441   Exam: Heart: Regular rate and rhythm, S1S2 present, or without murmur or extra heart sounds Lungs: normal and clear to auscultation Abdomen: soft, nontender, normal bowel sounds   Lab Results:    Latest Ref Rng & Units 07/12/2023    6:02 AM 07/10/2023   10:49 PM 07/04/2023   11:33 AM  CBC  WBC 4.0 - 10.5 K/uL 8.3  28.9  5.3   Hemoglobin 12.0 - 15.0 g/dL 10.2  72.5  36.6   Hematocrit 36.0 - 46.0 % 36.8  40.1  32.6   Platelets 150 - 400 K/uL 265  324  253       Latest Ref Rng & Units 07/12/2023    6:02 AM 07/10/2023   10:49 PM 07/04/2023   11:33 AM  CMP  Glucose 70 - 99 mg/dL 92  440  99   BUN 8 - 23 mg/dL 13  19  8    Creatinine 0.44 - 1.00 mg/dL 3.47  4.25  9.56   Sodium 135 - 145 mmol/L 140  139  140   Potassium 3.5 - 5.1 mmol/L 4.5  3.3  3.5   Chloride 98 - 111 mmol/L 110  108  110   CO2 22 - 32 mmol/L 22  24  23    Calcium 8.9 - 10.3 mg/dL 8.7  9.0  8.2   Total Protein 6.5 - 8.1 g/dL  7.6    Total Bilirubin 0.3 - 1.2 mg/dL  0.8     Alkaline Phos 38 - 126 U/L  157    AST 15 - 41 U/L  28    ALT 0 - 44 U/L  22      Micro Results: Recent Results (from the past 240 hour(s))  Blood Culture (routine x 2)     Status: None (Preliminary result)   Collection Time: 07/10/23 10:30 PM   Specimen: BLOOD  Result Value Ref Range Status   Specimen Description BLOOD LEFT ASSIST CONTROL  Final   Special Requests   Final    BOTTLES DRAWN AEROBIC AND ANAEROBIC Blood Culture adequate volume   Culture   Final    NO GROWTH 2 DAYS Performed at [facility], [address] [place], [place] [address]    Report Status PENDING  Incomplete  Blood Culture (routine x 2)     Status: None (Preliminary result)   Collection Time: 07/11/23  1:10 AM  Specimen: BLOOD  Result Value Ref Range Status   Specimen Description BLOOD RIGHT WRIST  Final   Special Requests   Final    BOTTLES DRAWN AEROBIC AND ANAEROBIC Blood Culture adequate volume   Culture   Final    NO GROWTH 1 DAY Performed at [facility], [address], [place], [place] [address]    Report Status PENDING  Incomplete  C Difficile Quick Screen w PCR reflex     Status: None   Collection Time: 07/11/23 10:02 AM   Specimen: STOOL  Result Value Ref Range Status   C Diff antigen NEGATIVE NEGATIVE Final   C Diff toxin NEGATIVE NEGATIVE Final   C Diff interpretation No C. difficile detected.  Final    Comment: Performed at [facility], [address] [place], [place] [address]  Gastrointestinal Panel by PCR , Stool     Status: None   Collection Time: 07/11/23 10:02 AM   Specimen: Stool  Result Value Ref Range Status   Campylobacter species NOT DETECTED NOT DETECTED Final   Plesimonas shigelloides NOT DETECTED NOT DETECTED Final   Salmonella species NOT DETECTED NOT DETECTED Final   Yersinia enterocolitica NOT DETECTED NOT DETECTED Final   Vibrio species NOT DETECTED NOT DETECTED Final   Vibrio cholerae NOT DETECTED NOT DETECTED Final    Enteroaggregative E coli (EAEC) NOT DETECTED NOT DETECTED Final   Enteropathogenic E coli (EPEC) NOT DETECTED NOT DETECTED Final   Enterotoxigenic E coli (ETEC) NOT DETECTED NOT DETECTED Final   Shiga like toxin producing E coli (STEC) NOT DETECTED NOT DETECTED Final   Shigella/Enteroinvasive E coli (EIEC) NOT DETECTED NOT DETECTED Final   Cryptosporidium NOT DETECTED NOT DETECTED Final   Cyclospora cayetanensis NOT DETECTED NOT DETECTED Final   Entamoeba histolytica NOT DETECTED NOT DETECTED Final   Giardia lamblia NOT DETECTED NOT DETECTED Final   Adenovirus F40/41 NOT DETECTED NOT DETECTED Final   Astrovirus NOT DETECTED NOT DETECTED Final   Norovirus GI/GII NOT DETECTED NOT DETECTED Final   Rotavirus A NOT DETECTED NOT DETECTED Final   Sapovirus (I, II, IV, and V) NOT DETECTED NOT DETECTED Final    Comment: Performed at [facility], [address], [place], [place] [address]   Studies/Results: CT ABDOMEN PELVIS W CONTRAST  Result Date: 07/11/2023 CLINICAL DATA:  Lower abdominal pain. Recent C diff infection. Diarrhea. EXAM: CT ABDOMEN AND PELVIS WITH CONTRAST TECHNIQUE: Multidetector CT imaging of the abdomen and pelvis was performed using the standard protocol following bolus administration of intravenous contrast. RADIATION DOSE REDUCTION: This exam was performed according to the departmental dose-optimization program which includes automated exposure control, adjustment of the mA and/or kV according to patient size and/or use of iterative reconstruction technique. CONTRAST:  80mL OMNIPAQUE IOHEXOL 300 MG/ML  SOLN COMPARISON:  CT abdomen and pelvis 06/29/2023 FINDINGS: Lower chest: No acute abnormality. Hepatobiliary: Mild hepatic steatosis. Layering sludge in the gallbladder. No cholecystitis or biliary dilation. Pancreas: Fatty atrophy without acute abnormality. Spleen: Mild splenomegaly. Adrenals/Urinary Tract: Stable adrenal glands. Left greater than right renal parenchymal  atrophy. Prominent parapelvic cysts bilaterally. No follow-up recommended. Nonobstructing punctate right nephrolithiasis. Unchanged left hydronephrosis and hydroureter without obstructing calculus. Streak artifact limits assessment of the distal ureter an obstructing mass is not excluded. Unremarkable bladder were visualized. Stomach/Bowel: The rectum is not well evaluated due to streak artifact. Normal caliber large and small bowel without bowel wall thickening. Stomach is within normal limits. The appendix is not definitively visualized. No secondary signs  of appendicitis. Vascular/Lymphatic: Aortic atherosclerosis. No enlarged abdominal or pelvic lymph nodes. Reproductive: Streak artifact limits assessment.  Hysterectomy. Other: Question trace free fluid in the pelvis. No free intraperitoneal air. Musculoskeletal: Demineralization. Bilateral THAs. Streak artifact limits assessment for acute fracture. A screw of the right THA extends into the right pelvis and exerts mass effect on the right external iliac artery and vein. Unchanged compression fractures of T12, L1, and L5. Probable sacral insufficiency fracture. Edema or contusion in both flanks. IMPRESSION: 1. No acute abnormality in the abdomen or pelvis. 2. Unchanged left hydronephrosis and hydroureter without obstructing calculus. Streak artifact limits assessment of the distal ureter and obstructing mass is not excluded. 3. Nonobstructing punctate right nephrolithiasis. 4. Mild hepatic steatosis. 5. Mild splenomegaly. 6. Probable sacral insufficiency fracture. Aortic Atherosclerosis (ICD10-I70.0). Electronically Signed   By: Minerva Fester M.D.   On: 07/11/2023 04:09   DG Chest Port 1 View  Result Date: 07/10/2023 CLINICAL DATA:  Questionable sepsis EXAM: PORTABLE CHEST 1 VIEW COMPARISON:  Chest x-ray 05/01/2022 FINDINGS: The heart size and mediastinal contours are within normal limits. Calcified right paratracheal lymph node again noted. Both lungs are  clear. T there are degenerative changes of both shoulders. He visualized skeletal structures are unremarkable. IMPRESSION: No active disease. Electronically Signed   By: Darliss Cheney M.D.   On: 07/10/2023 23:18   Medications: I have reviewed the patient's current medications. Prior to Admission:  Medications Prior to Admission  Medication Sig Dispense Refill Last Dose   acidophilus (RISAQUAD) CAPS capsule Take 1 capsule by mouth daily. 30 capsule 0 07/10/2023   conjugated estrogens (PREMARIN) vaginal cream Place vaginally 3 (three) times a week. 42.5 g 12 07/10/2023   gabapentin (NEURONTIN) 100 MG capsule Take 100 mg by mouth at bedtime.   07/09/2023   Nystatin (GERHARDT'S BUTT CREAM) CREA Apply 1 Application topically 3 (three) times daily. 90 each 0 07/10/2023   oxyCODONE (OXY IR/ROXICODONE) 5 MG immediate release tablet Take 1 tablet (5 mg total) by mouth every 6 (six) hours as needed. 15 tablet 0 07/10/2023   promethazine (PHENERGAN) 25 MG tablet Take 12.5 mg by mouth every 6 (six) hours as needed for nausea.   07/10/2023   vancomycin (VANCOCIN) 125 MG capsule Take 1 capsule (125 mg total) by mouth 4 (four) times daily for 12 days, THEN 1 capsule (125 mg total) 2 (two) times daily for 7 days, THEN 1 capsule (125 mg total) daily for 7 days, THEN 1 capsule (125 mg total) every other day for 28 days, THEN 1 capsule (125 mg total) every 3 (three) days for 28 days. 92 capsule 0 07/10/2023   diclofenac (VOLTAREN) 75 MG EC tablet Take 75 mg by mouth 2 (two) times daily. (Patient not taking: Reported on 07/11/2023)   Not Taking   Scheduled:  acetaminophen  650 mg Oral Once   acidophilus  1 capsule Oral Daily   enoxaparin (LOVENOX) injection  40 mg Subcutaneous Q24H   fidaxomicin  200 mg Oral BID   ketorolac  15 mg Intravenous Once   lactose free nutrition  237 mL Oral TID WC   lipase/protease/amylase  36,000 Units Oral TID WC   Continuous: BJY:[id number] **OR** acetaminophen, loperamide,  morphine injection, ondansetron **OR** ondansetron (ZOFRAN) IV, oxyCODONE Anti-infectives (From admission, onward)    Start     Dose/Rate Route Frequency Ordered Stop   07/11/23 1000  vancomycin (VANCOCIN) capsule 125 mg  Status:  Discontinued        125 mg Oral 4  times daily 07/11/23 0837 07/11/23 0904   07/11/23 1000  fidaxomicin (DIFICID) tablet 200 mg        200 mg Oral 2 times daily 07/11/23 1610     07/10/23 2330  metroNIDAZOLE (FLAGYL) IVPB 500 mg        500 mg 100 mL/hr over 60 Minutes Intravenous  Once 07/10/23 2318 07/11/23 9604      Scheduled Meds:  acetaminophen  650 mg Oral Once   acidophilus  1 capsule Oral Daily   enoxaparin (LOVENOX) injection  40 mg Subcutaneous Q24H   fidaxomicin  200 mg Oral BID   ketorolac  15 mg Intravenous Once   lactose free nutrition  237 mL Oral TID WC   lipase/protease/amylase  36,000 Units Oral TID WC   Continuous Infusions: PRN Meds:.acetaminophen **OR** acetaminophen, loperamide, morphine injection, ondansetron **OR** ondansetron (ZOFRAN) IV, oxyCODONE   Assessment: Principal Problem:   Sepsis due to undetermined organism [facility]) Active Problems:   C. difficile colitis   Chronic diarrhea  Jeanne [name] is a 72 y.o. female with stage III CKD, hypertension not on any antihypertensives, history of recurrent C. difficile infections within last 1 year, treated with oral vancomycin as well as vancomycin taper, most recent episode was last week, discharged on oral vancomycin readmitted with acute diarrhea, nausea and lower abdominal pain with leukocytosis   Plan: Chronic diarrhea with history of recurrent C. difficile infection: Patient had only 1 episode of nonbloody loose BM today CT abdomen and pelvis did not reveal any acute intra-abdominal pathology GI profile PCR and C. difficile toxin, antigen came back negative during this admission CT revealed fatty atrophic pancreas, recommend to check pancreatic fecal elastase levels Check  serum a.m. cortisol levels Continue Creon 36K capsules with each meal Differentials include osmotic diarrhea or postinfectious IBS diarrhea or microscopic colitis or bacterial overgrowth or lactose intolerance or multifactorial Continue Imodium every 6-8 hours Patient is eligible for fecal microbiota given history of recurrent C. difficile infections and this can be pursued as outpatient Diet as tolerated, recommend lactose-free diet Follow-up with Dr. Norma Fredrickson upon discharge   LOS: 1 day   Lydell Moga 07/12/2023, 2:41 PM

## 2023-07-12 NOTE — TOC Progression Note (Signed)
Transition of Care [facility]) - Progression Note    Patient Details  Name: KOREA SEVERS MRN: [id number] Date of Birth: 1950/12/31  Transition of Care [facility]) CM/SW Contact  Truddie Hidden, RN Phone Number: 07/12/2023, 1:32 PM  Clinical Narrative:    TOC continuing to follow patient's progress throughout discharge planning.     Expected Discharge Plan: Home w Home Health Services Barriers to Discharge: Continued Medical Work up  Expected Discharge Plan and Services In-house Referral: Clinical Social Work Discharge Planning Services: NA Post Acute Care Choice: Home Health Living arrangements for the past 2 months: Single Family Home                           HH Arranged:  ([facility] in place) HH Agency: [facility]         Social Determinants of Health (SDOH) Interventions SDOH Screenings   Food Insecurity: No Food Insecurity (07/01/2023)  Housing: Low Risk  (07/01/2023)  Transportation Needs: No Transportation Needs (07/01/2023)  Utilities: Not At Risk (07/01/2023)  Alcohol Screen: Low Risk  (08/10/2020)  Depression (PHQ2-9): Low Risk  (08/10/2020)  Financial Resource Strain: Low Risk  (04/24/2022)   Received from [facility]  Physical Activity: Inactive (08/10/2020)  Social Connections: Moderately Integrated (08/10/2020)  Stress: No Stress Concern Present (08/10/2020)  Tobacco Use: Medium Risk (07/01/2023)    Readmission Risk Interventions    07/11/2023   12:28 PM 07/01/2023   10:31 AM 03/01/2023   10:56 AM  Readmission Risk Prevention Plan  Transportation Screening Complete Complete Complete  PCP or Specialist Appt within 3-5 Days Complete  Complete  HRI or Home Care Consult Complete  Complete  Social Work Consult for Recovery Care Planning/Counseling Complete  Complete  Palliative Care Screening Complete  Not Applicable  Medication Review [employer]) Complete  Complete  PCP or Specialist  appointment within 3-5 days of discharge  Complete   HRI or Home Care Consult  Complete   SW Recovery Care/Counseling Consult  Complete   Palliative Care Screening  Not Applicable   Skilled Nursing Facility  Not Applicable

## 2023-07-12 NOTE — Progress Notes (Signed)
PROGRESS NOTE    Jeanne [name]  ZOX:[id number] DOB: 06-27-1951 DOA: 07/10/2023 PCP: Jerl Mina, MD    Assessment & Plan:   Principal Problem:   Sepsis due to undetermined organism [facility]) Active Problems:   C. difficile colitis   Chronic diarrhea  Assessment and Plan: Diarrhea: continue on fidaxomicin. C. Diff test neg. GI PCR panel was neg    Leukocytosis: likely secondary to above. Continue on abxs   Chronic left-sided obstructive uropathy: stable on today's CT scan. Recommend outpatient follow-up with urology.        DVT prophylaxis: lovenox  Code Status: full  Family Communication:  Disposition Plan: likely d/c back home  Level of care: Med-Surg  Status is: Inpatient Remains inpatient appropriate because: severity of illness    Consultants:  GI  Procedures:   Antimicrobials:    Subjective: Pt c/o diarrhea   Objective: Vitals:   07/11/23 1735 07/11/23 2111 07/12/23 0533 07/12/23 0754  BP: (!) 118/56 (!) 113/57 (!) 116/59 119/60  Pulse: 79 83 79 87  Resp: 18 18 20 16   Temp: 98.5 F (36.9 C) 98.5 F (36.9 C) 98.3 F (36.8 C) 98.7 F (37.1 C)  TempSrc: Oral Oral Oral Oral  SpO2: 100% 99% 100% 99%  Weight:       No intake or output data in the 24 hours ending 07/12/23 0812 Filed Weights   07/11/23 0125  Weight: 62.1 kg    Examination:  General exam: Appears calm and comfortable  Respiratory system: Clear to auscultation. Respiratory effort normal. Cardiovascular system: S1 & S2 +. No rubs, gallops or clicks.  Gastrointestinal system: Abdomen is nondistended, soft and nontender. Normal bowel sounds heard. Central nervous system: Alert and oriented Psychiatry: Judgement and insight appear normal. Flat mood and affect    Data Reviewed: I have personally reviewed following labs and imaging studies  CBC: Recent Labs  Lab 07/10/23 2249 07/12/23 0602  WBC 28.9* 8.3  NEUTROABS 27.2*  --   HGB 13.0 11.4*  HCT 40.1 36.8  MCV 88.9  91.5  PLT 324 265   Basic Metabolic Panel: Recent Labs  Lab 07/10/23 2249 07/11/23 0110 07/12/23 0602  NA 139  --  140  K 3.3*  --  4.5  CL 108  --  110  CO2 24  --  22  GLUCOSE 148*  --  92  BUN 19  --  13  CREATININE 1.05*  --  1.00  CALCIUM 9.0  --  8.7*  MG  --  1.6*  --    GFR: Estimated Creatinine Clearance: 45.2 mL/min (by C-G formula based on SCr of 1 mg/dL). Liver Function Tests: Recent Labs  Lab 07/10/23 2249  AST 28  ALT 22  ALKPHOS 157*  BILITOT 0.8  PROT 7.6  ALBUMIN 3.5   Recent Labs  Lab 07/10/23 2249  LIPASE 25   No results for input(s): "AMMONIA" in the last 168 hours. Coagulation Profile: No results for input(s): "INR", "PROTIME" in the last 168 hours. Cardiac Enzymes: No results for input(s): "CKTOTAL", "CKMB", "CKMBINDEX", "TROPONINI" in the last 168 hours. BNP (last 3 results) No results for input(s): "PROBNP" in the last 8760 hours. HbA1C: No results for input(s): "HGBA1C" in the last 72 hours. CBG: No results for input(s): "GLUCAP" in the last 168 hours. Lipid Profile: No results for input(s): "CHOL", "HDL", "LDLCALC", "TRIG", "CHOLHDL", "LDLDIRECT" in the last 72 hours. Thyroid Function Tests: No results for input(s): "TSH", "T4TOTAL", "FREET4", "T3FREE", "THYROIDAB" in the last 72 hours.  Anemia Panel: No results for input(s): "VITAMINB12", "FOLATE", "FERRITIN", "TIBC", "IRON", "RETICCTPCT" in the last 72 hours. Sepsis Labs: Recent Labs  Lab 07/10/23 2249 07/11/23 0110  LATICACIDVEN 2.0* 1.5    Recent Results (from the past 240 hour(s))  Blood Culture (routine x 2)     Status: None (Preliminary result)   Collection Time: 07/10/23 10:30 PM   Specimen: BLOOD  Result Value Ref Range Status   Specimen Description BLOOD LEFT ASSIST CONTROL  Final   Special Requests   Final    BOTTLES DRAWN AEROBIC AND ANAEROBIC Blood Culture adequate volume   Culture   Final    NO GROWTH 2 DAYS Performed at [facility], [address] [place], [place] [address]    Report Status PENDING  Incomplete  Blood Culture (routine x 2)     Status: None (Preliminary result)   Collection Time: 07/11/23  1:10 AM   Specimen: BLOOD  Result Value Ref Range Status   Specimen Description BLOOD RIGHT WRIST  Final   Special Requests   Final    BOTTLES DRAWN AEROBIC AND ANAEROBIC Blood Culture adequate volume   Culture   Final    NO GROWTH 1 DAY Performed at [facility], [address] [place], [place] [address]    Report Status PENDING  Incomplete  C Difficile Quick Screen w PCR reflex     Status: None   Collection Time: 07/11/23 10:02 AM   Specimen: STOOL  Result Value Ref Range Status   C Diff antigen NEGATIVE NEGATIVE Final   C Diff toxin NEGATIVE NEGATIVE Final   C Diff interpretation No C. difficile detected.  Final    Comment: Performed at [facility], [address] [place], [place] [address]  Gastrointestinal Panel by PCR , Stool     Status: None   Collection Time: 07/11/23 10:02 AM   Specimen: Stool  Result Value Ref Range Status   Campylobacter species NOT DETECTED NOT DETECTED Final   Plesimonas shigelloides NOT DETECTED NOT DETECTED Final   Salmonella species NOT DETECTED NOT DETECTED Final   Yersinia enterocolitica NOT DETECTED NOT DETECTED Final   Vibrio species NOT DETECTED NOT DETECTED Final   Vibrio cholerae NOT DETECTED NOT DETECTED Final   Enteroaggregative E coli (EAEC) NOT DETECTED NOT DETECTED Final   Enteropathogenic E coli (EPEC) NOT DETECTED NOT DETECTED Final   Enterotoxigenic E coli (ETEC) NOT DETECTED NOT DETECTED Final   Shiga like toxin producing E coli (STEC) NOT DETECTED NOT DETECTED Final   Shigella/Enteroinvasive E coli (EIEC) NOT DETECTED NOT DETECTED Final   Cryptosporidium NOT DETECTED NOT DETECTED Final   Cyclospora cayetanensis NOT DETECTED NOT DETECTED Final   Entamoeba histolytica NOT DETECTED NOT DETECTED Final   Giardia lamblia NOT DETECTED NOT DETECTED  Final   Adenovirus F40/41 NOT DETECTED NOT DETECTED Final   Astrovirus NOT DETECTED NOT DETECTED Final   Norovirus GI/GII NOT DETECTED NOT DETECTED Final   Rotavirus A NOT DETECTED NOT DETECTED Final   Sapovirus (I, II, IV, and V) NOT DETECTED NOT DETECTED Final    Comment: Performed at [facility], [address], [place], [place] [address]         Radiology Studies: CT ABDOMEN PELVIS W CONTRAST  Result Date: 07/11/2023 CLINICAL DATA:  Lower abdominal pain. Recent C diff infection. Diarrhea. EXAM: CT ABDOMEN AND PELVIS WITH CONTRAST TECHNIQUE: Multidetector CT imaging of the abdomen and pelvis was performed using the standard protocol following bolus administration of  intravenous contrast. RADIATION DOSE REDUCTION: This exam was performed according to the departmental dose-optimization program which includes automated exposure control, adjustment of the mA and/or kV according to patient size and/or use of iterative reconstruction technique. CONTRAST:  80mL OMNIPAQUE IOHEXOL 300 MG/ML  SOLN COMPARISON:  CT abdomen and pelvis 06/29/2023 FINDINGS: Lower chest: No acute abnormality. Hepatobiliary: Mild hepatic steatosis. Layering sludge in the gallbladder. No cholecystitis or biliary dilation. Pancreas: Fatty atrophy without acute abnormality. Spleen: Mild splenomegaly. Adrenals/Urinary Tract: Stable adrenal glands. Left greater than right renal parenchymal atrophy. Prominent parapelvic cysts bilaterally. No follow-up recommended. Nonobstructing punctate right nephrolithiasis. Unchanged left hydronephrosis and hydroureter without obstructing calculus. Streak artifact limits assessment of the distal ureter an obstructing mass is not excluded. Unremarkable bladder were visualized. Stomach/Bowel: The rectum is not well evaluated due to streak artifact. Normal caliber large and small bowel without bowel wall thickening. Stomach is within normal limits. The appendix is not definitively  visualized. No secondary signs of appendicitis. Vascular/Lymphatic: Aortic atherosclerosis. No enlarged abdominal or pelvic lymph nodes. Reproductive: Streak artifact limits assessment.  Hysterectomy. Other: Question trace free fluid in the pelvis. No free intraperitoneal air. Musculoskeletal: Demineralization. Bilateral THAs. Streak artifact limits assessment for acute fracture. A screw of the right THA extends into the right pelvis and exerts mass effect on the right external iliac artery and vein. Unchanged compression fractures of T12, L1, and L5. Probable sacral insufficiency fracture. Edema or contusion in both flanks. IMPRESSION: 1. No acute abnormality in the abdomen or pelvis. 2. Unchanged left hydronephrosis and hydroureter without obstructing calculus. Streak artifact limits assessment of the distal ureter and obstructing mass is not excluded. 3. Nonobstructing punctate right nephrolithiasis. 4. Mild hepatic steatosis. 5. Mild splenomegaly. 6. Probable sacral insufficiency fracture. Aortic Atherosclerosis (ICD10-I70.0). Electronically Signed   By: Minerva Fester M.D.   On: 07/11/2023 04:09   DG Chest Port 1 View  Result Date: 07/10/2023 CLINICAL DATA:  Questionable sepsis EXAM: PORTABLE CHEST 1 VIEW COMPARISON:  Chest x-ray 05/01/2022 FINDINGS: The heart size and mediastinal contours are within normal limits. Calcified right paratracheal lymph node again noted. Both lungs are clear. T there are degenerative changes of both shoulders. He visualized skeletal structures are unremarkable. IMPRESSION: No active disease. Electronically Signed   By: Darliss Cheney M.D.   On: 07/10/2023 23:18        Scheduled Meds:  acetaminophen  650 mg Oral Once   acidophilus  1 capsule Oral Daily   enoxaparin (LOVENOX) injection  40 mg Subcutaneous Q24H   fidaxomicin  200 mg Oral BID   ketorolac  15 mg Intravenous Once   lactose free nutrition  237 mL Oral TID WC   lipase/protease/amylase  36,000 Units Oral  TID WC   Continuous Infusions:   LOS: 1 day      Charise Killian, MD [facility] Pager [phone]  If 7PM-7AM, please contact night-coverage [email or web address] 07/12/2023, 8:12 AM

## 2023-07-12 NOTE — Plan of Care (Signed)

## 2023-07-13 DIAGNOSIS — K529 Noninfective gastroenteritis and colitis, unspecified: Secondary | ICD-10-CM

## 2023-07-13 LAB — CORTISOL-AM, BLOOD: Cortisol - AM: 6.4 ug/dL — ABNORMAL LOW (ref 6.7–22.6)

## 2023-07-13 LAB — CELIAC DISEASE PANEL
Endomysial Ab, IgA: NEGATIVE
IgA: 342 mg/dL (ref 87–352)
Tissue Transglutaminase Ab, IgA: <2 U/mL (ref 0–3)

## 2023-07-13 MED ORDER — GERHARDT'S BUTT CREAM
TOPICAL_CREAM | Freq: Three times a day (TID) | CUTANEOUS | Status: DC
  Administered 2023-07-13: 1 via TOPICAL
  Filled 2023-07-13: qty 1

## 2023-07-13 MED ORDER — HYDROXYZINE HCL 10 MG PO TABS
10.0000 mg | ORAL_TABLET | Freq: Once | ORAL | Status: AC
  Administered 2023-07-13: 10 mg via ORAL
  Filled 2023-07-13: qty 1

## 2023-07-13 NOTE — Plan of Care (Signed)

## 2023-07-13 NOTE — Evaluation (Signed)
Physical Therapy Evaluation Patient Details Name: Jeanne [name] MRN: [id number] DOB: 10-10-1950 Today's Date: 07/13/2023  History of Present Illness  Jeanne [name] is a 72 y.o. female with medical history significant of recurrent C. difficile colitis, rheumatoid arthritis, HTN, CKD stage IIIb with chronic left-sided hydronephrosis, cervical cancer status post radiation therapy, presented with sudden onset of abdominal pain and diarrhea.  Clinical Impression  Pt is seen by OT and PT for co-evaluation to address functional mobility. At baseline, Pt reports requiring assist for ADL/IADLs and recently working with HHPT to take 12 steps using RW. Pt performs bed mobility minAx2; unable to further assess mobility secondary to Pt reports of back pain and being "too sick right now". Pt able to demonstrate rolling minA and sidelying>sit minA x2 for BLE and trunk support, however, unable to tolerate more than 30sec due to onset of back pain. During hygiene care, notable redness of skin near buttocks area which was notified to RN. Pt would benefit from skilled PT to address above deficits and promote optimal return to PLOF.        If plan is discharge home, recommend the following: Two people to help with walking and/or transfers;Two people to help with bathing/dressing/bathroom;Assistance with cooking/housework;Assist for transportation;Help with stairs or ramp for entrance;Supervision due to cognitive status   Can travel by private vehicle        Equipment Recommendations None recommended by PT  Recommendations for Other Services       Functional Status Assessment Patient has had a recent decline in their functional status and demonstrates the ability to make significant improvements in function in a reasonable and predictable amount of time.     Precautions / Restrictions Precautions Precautions: Fall Restrictions Weight Bearing Restrictions: No      Mobility  Bed Mobility Overal bed  mobility: Needs Assistance Bed Mobility: Rolling, Sidelying to Sit, Sit to Supine Rolling: Min assist Sidelying to sit: Min assist, +2 for physical assistance   Sit to supine: Mod assist, +2 for physical assistance   General bed mobility comments: Pt declined OOB mobility being "too sick right now" but otherwise able to perform bed mobility minA x2    Transfers                   General transfer comment: deferred citing pain in sitting    Ambulation/Gait               General Gait Details: Deferred at this time secondary to pain  Stairs            Wheelchair Mobility     Tilt Bed    Modified Rankin (Stroke Patients Only)       Balance Overall balance assessment: Needs assistance Sitting-balance support: Bilateral upper extremity supported, Feet unsupported Sitting balance-Leahy Scale: Fair Sitting balance - Comments: pt able to maintain seated EOB balance with BUE support; heavy reliance on BUE support                                     Pertinent Vitals/Pain Pain Assessment Pain Assessment: Faces Faces Pain Scale: Hurts even more Pain Location: back Pain Descriptors / Indicators: Discomfort, Guarding Pain Intervention(s): Limited activity within patient's tolerance    Home Living Family/patient expects to be discharged to:: Private residence Living Arrangements: Spouse/significant other Available Help at Discharge: Family;Available 24 hours/day Type of Home: House Home Access: Ramped entrance  Home Layout: One level Home Equipment: Agricultural consultant (2 wheels);Wheelchair - Dentist;Other (comment) Additional Comments: has bilat platforms for RW, has dressing stick    Prior Function Prior Level of Function : Needs assist       Physical Assist : Mobility (physical) Mobility (physical): Bed mobility;Transfers;Gait   Mobility Comments: in bed, will sit on edge of bed for  ADLs/exercise; reports walking 12 steps with HHPT ADLs Comments: assist for bathing (sponge baths), dressing, toileting (pure wik), all IADLs at this time     Extremity/Trunk Assessment   Upper Extremity Assessment Upper Extremity Assessment: Generalized weakness;Defer to OT evaluation    Lower Extremity Assessment Lower Extremity Assessment: Generalized weakness       Communication   Communication Communication: No apparent difficulties Cueing Techniques: Verbal cues;Tactile cues  Cognition Arousal: Alert Behavior During Therapy: WFL for tasks assessed/performed Overall Cognitive Status: Within Functional Limits for tasks assessed                                 General Comments: Pleasant but self-limiting mobility; requires frequent encouragement        General Comments General comments (skin integrity, edema, etc.): Peri area with redness, pain with wiping/touch after from OT    Exercises Other Exercises Other Exercises: hygiene care; assist with peri-care from OT   Assessment/Plan    PT Assessment Patient needs continued PT services  PT Problem List Decreased strength;Decreased range of motion;Decreased activity tolerance;Decreased balance;Decreased mobility;Decreased safety awareness;Cardiopulmonary status limiting activity       PT Treatment Interventions DME instruction;Functional mobility training;Therapeutic exercise;Gait training;Therapeutic activities;Balance training;Patient/family education;Wheelchair mobility training    PT Goals (Current goals can be found in the Care Plan section)  Acute Rehab PT Goals Patient Stated Goal: to go home PT Goal Formulation: With patient Time For Goal Achievement: 07/27/23 Potential to Achieve Goals: Fair    Frequency Min 1X/week     Co-evaluation PT/OT/SLP Co-Evaluation/Treatment: Yes Reason for Co-Treatment: For patient/therapist safety;To address functional/ADL transfers PT goals addressed during  session: Mobility/safety with mobility OT goals addressed during session: ADL's and self-care       AM-PAC PT "6 Clicks" Mobility  Outcome Measure Help needed turning from your back to your side while in a flat bed without using bedrails?: A Little Help needed moving from lying on your back to sitting on the side of a flat bed without using bedrails?: A Lot Help needed moving to and from a bed to a chair (including a wheelchair)?: A Lot Help needed standing up from a chair using your arms (e.g., wheelchair or bedside chair)?: A Lot Help needed to walk in hospital room?: Total Help needed climbing 3-5 steps with a railing? : Total 6 Click Score: 11    End of Session   Activity Tolerance: Patient limited by pain Patient left: in bed;with call bell/phone within reach;with bed alarm set Nurse Communication: Mobility status PT Visit Diagnosis: Unsteadiness on feet (R26.81);Muscle weakness (generalized) (M62.81);Other abnormalities of gait and mobility (R26.89)    Time: 1610-9604 PT Time Calculation (min) (ACUTE ONLY): 16 min   Charges:                 Elmon Else, SPT   Sonjia Wilcoxson 07/13/2023, 2:10 PM

## 2023-07-13 NOTE — Plan of Care (Signed)

## 2023-07-13 NOTE — Progress Notes (Signed)
PROGRESS NOTE    Jeanne [name]  WUJ:[id number] DOB: August 22, 1951 DOA: 07/10/2023 PCP: Jerl Mina, MD    Assessment & Plan:   Principal Problem:   Sepsis due to undetermined organism [facility]) Active Problems:   C. difficile colitis   Chronic diarrhea  Assessment and Plan: Diarrhea: continue on fidaxomicin while inpatient and will continue w/ po vanco taper at d/c. C. Diff test neg. GI PCR panel was neg    Leukocytosis: resolved    Chronic left-sided obstructive uropathy: stable on today's CT scan. Recommend outpatient follow-up with urology.        DVT prophylaxis: lovenox  Code Status: full  Family Communication:  Disposition Plan: likely d/c back home  Level of care: Med-Surg  Status is: Inpatient Remains inpatient appropriate because: severity of illness    Consultants:  GI  Procedures:   Antimicrobials:    Subjective: Pt c/o malaise   Objective: Vitals:   07/12/23 1441 07/12/23 1531 07/12/23 2033 07/13/23 0655  BP: 134/73 (!) 142/61 123/62 (!) 117/56  Pulse: 92 86 83 84  Resp: 18 16 18 18   Temp: 98.3 F (36.8 C) 98.3 F (36.8 C) 98.4 F (36.9 C) 98.1 F (36.7 C)  TempSrc:  Oral Oral Oral  SpO2: 98% 98% 98% 99%  Weight:        Intake/Output Summary (Last 24 hours) at 07/13/2023 0818 Last data filed at 07/13/2023 0221 Gross per 24 hour  Intake 240 ml  Output --  Net 240 ml   Filed Weights   07/11/23 0125  Weight: 62.1 kg    Examination:  General exam: Appears comfortable  Respiratory system: clear breath sounds b/l  Cardiovascular system: S1/S2+. No rubs or clicks  Gastrointestinal system: abd soft, NT, ND & normal bowel sounds Central nervous system: alert & oriented. Moves all extremities  Psychiatry: Judgement and insight appears at baseline. Flat mood and affect     Data Reviewed: I have personally reviewed following labs and imaging studies  CBC: Recent Labs  Lab 07/10/23 2249 07/12/23 0602  WBC 28.9* 8.3   NEUTROABS 27.2*  --   HGB 13.0 11.4*  HCT 40.1 36.8  MCV 88.9 91.5  PLT 324 265   Basic Metabolic Panel: Recent Labs  Lab 07/10/23 2249 07/11/23 0110 07/12/23 0602  NA 139  --  140  K 3.3*  --  4.5  CL 108  --  110  CO2 24  --  22  GLUCOSE 148*  --  92  BUN 19  --  13  CREATININE 1.05*  --  1.00  CALCIUM 9.0  --  8.7*  MG  --  1.6*  --    GFR: Estimated Creatinine Clearance: 45.2 mL/min (by C-G formula based on SCr of 1 mg/dL). Liver Function Tests: Recent Labs  Lab 07/10/23 2249  AST 28  ALT 22  ALKPHOS 157*  BILITOT 0.8  PROT 7.6  ALBUMIN 3.5   Recent Labs  Lab 07/10/23 2249  LIPASE 25   No results for input(s): "AMMONIA" in the last 168 hours. Coagulation Profile: No results for input(s): "INR", "PROTIME" in the last 168 hours. Cardiac Enzymes: No results for input(s): "CKTOTAL", "CKMB", "CKMBINDEX", "TROPONINI" in the last 168 hours. BNP (last 3 results) No results for input(s): "PROBNP" in the last 8760 hours. HbA1C: No results for input(s): "HGBA1C" in the last 72 hours. CBG: No results for input(s): "GLUCAP" in the last 168 hours. Lipid Profile: No results for input(s): "CHOL", "HDL", "LDLCALC", "TRIG", "CHOLHDL", "LDLDIRECT"  in the last 72 hours. Thyroid Function Tests: No results for input(s): "TSH", "T4TOTAL", "FREET4", "T3FREE", "THYROIDAB" in the last 72 hours. Anemia Panel: No results for input(s): "VITAMINB12", "FOLATE", "FERRITIN", "TIBC", "IRON", "RETICCTPCT" in the last 72 hours. Sepsis Labs: Recent Labs  Lab 07/10/23 2249 07/11/23 0110  LATICACIDVEN 2.0* 1.5    Recent Results (from the past 240 hour(s))  Blood Culture (routine x 2)     Status: None (Preliminary result)   Collection Time: 07/10/23 10:30 PM   Specimen: BLOOD  Result Value Ref Range Status   Specimen Description BLOOD LEFT ASSIST CONTROL  Final   Special Requests   Final    BOTTLES DRAWN AEROBIC AND ANAEROBIC Blood Culture adequate volume   Culture   Final     NO GROWTH 3 DAYS Performed at [facility], [address] [place], [place] [address]    Report Status PENDING  Incomplete  Blood Culture (routine x 2)     Status: None (Preliminary result)   Collection Time: 07/11/23  1:10 AM   Specimen: BLOOD  Result Value Ref Range Status   Specimen Description BLOOD RIGHT WRIST  Final   Special Requests   Final    BOTTLES DRAWN AEROBIC AND ANAEROBIC Blood Culture adequate volume   Culture   Final    NO GROWTH 2 DAYS Performed at [facility], [address] [place], [place] [address]    Report Status PENDING  Incomplete  C Difficile Quick Screen w PCR reflex     Status: None   Collection Time: 07/11/23 10:02 AM   Specimen: STOOL  Result Value Ref Range Status   C Diff antigen NEGATIVE NEGATIVE Final   C Diff toxin NEGATIVE NEGATIVE Final   C Diff interpretation No C. difficile detected.  Final    Comment: Performed at [facility], [address] [place], [place] [address]  Gastrointestinal Panel by PCR , Stool     Status: None   Collection Time: 07/11/23 10:02 AM   Specimen: Stool  Result Value Ref Range Status   Campylobacter species NOT DETECTED NOT DETECTED Final   Plesimonas shigelloides NOT DETECTED NOT DETECTED Final   Salmonella species NOT DETECTED NOT DETECTED Final   Yersinia enterocolitica NOT DETECTED NOT DETECTED Final   Vibrio species NOT DETECTED NOT DETECTED Final   Vibrio cholerae NOT DETECTED NOT DETECTED Final   Enteroaggregative E coli (EAEC) NOT DETECTED NOT DETECTED Final   Enteropathogenic E coli (EPEC) NOT DETECTED NOT DETECTED Final   Enterotoxigenic E coli (ETEC) NOT DETECTED NOT DETECTED Final   Shiga like toxin producing E coli (STEC) NOT DETECTED NOT DETECTED Final   Shigella/Enteroinvasive E coli (EIEC) NOT DETECTED NOT DETECTED Final   Cryptosporidium NOT DETECTED NOT DETECTED Final   Cyclospora cayetanensis NOT DETECTED NOT DETECTED Final   Entamoeba histolytica NOT  DETECTED NOT DETECTED Final   Giardia lamblia NOT DETECTED NOT DETECTED Final   Adenovirus F40/41 NOT DETECTED NOT DETECTED Final   Astrovirus NOT DETECTED NOT DETECTED Final   Norovirus GI/GII NOT DETECTED NOT DETECTED Final   Rotavirus A NOT DETECTED NOT DETECTED Final   Sapovirus (I, II, IV, and V) NOT DETECTED NOT DETECTED Final    Comment: Performed at [name], [address] [place], [place] [address]         Radiology Studies: No results found.      Scheduled Meds:  acetaminophen  650 mg Oral Once   acidophilus  1 capsule Oral  Daily   enoxaparin (LOVENOX) injection  40 mg Subcutaneous Q24H   fidaxomicin  200 mg Oral BID   ketorolac  15 mg Intravenous Once   lactose free nutrition  237 mL Oral TID WC   lipase/protease/amylase  36,000 Units Oral TID WC   Continuous Infusions:   LOS: 2 days      Charise Killian, MD [facility] Pager [phone]  If 7PM-7AM, please contact night-coverage [email or web address] 07/13/2023, 8:18 AM

## 2023-07-13 NOTE — Evaluation (Signed)
Occupational Therapy Evaluation Patient Details Name: Jeanne [name] MRN: [id number] DOB: 11/17/1950 Today's Date: 07/13/2023   History of Present Illness Jeanne [name] is a 72 y.o. female with medical history significant of recurrent C. difficile colitis, rheumatoid arthritis, HTN, CKD stage IIIb with chronic left-sided hydronephrosis, cervical cancer status post radiation therapy, presented with sudden onset of abdominal pain and diarrhea.   Clinical Impression   Jeanne [name] was seen for OT/PT co-evaluation this date. Prior to hospital admission, pt has been primarily bed level, walking limited distances with HHPT. Pt lives with family available 24/7. Pt currently requires MAX A pericare bed level, MIN A rolling for bed level toileting. MIN A sup>sit, fair sitting balance, tolerates <2 min, limited by pain. P:t reports near here functional baseline, has all equipment needed at home and assistance. Will maintain on caseload while admitted and upon hospital discharge, recommend OT follow up.    If plan is discharge home, recommend the following: A lot of help with walking and/or transfers;A lot of help with bathing/dressing/bathroom;Assistance with cooking/housework;Assist for transportation;Help with stairs or ramp for entrance;Direct supervision/assist for financial management    Functional Status Assessment  Patient has had a recent decline in their functional status and demonstrates the ability to make significant improvements in function in a reasonable and predictable amount of time.  Equipment Recommendations  None recommended by OT    Recommendations for Other Services       Precautions / Restrictions Precautions Precautions: Fall Restrictions Weight Bearing Restrictions: No      Mobility Bed Mobility Overal bed mobility: Needs Assistance Bed Mobility: Rolling, Sidelying to Sit, Sit to Supine Rolling: Min assist Sidelying to sit: Min assist, +2 for physical assistance    Sit to supine: Mod assist, +2 for physical assistance        Transfers                   General transfer comment: deferred citing pain in sitting      Balance Overall balance assessment: Needs assistance Sitting-balance support: Bilateral upper extremity supported, Feet unsupported Sitting balance-Leahy Scale: Fair                                     ADL either performed or assessed with clinical judgement   ADL Overall ADL's : Needs assistance/impaired                                       General ADL Comments: MAX A pericare bed level, MIN A rolling for bed level toileting. SETUP bed level grooming tasks    Pertinent Vitals/Pain Pain Assessment Pain Assessment: Faces Faces Pain Scale: Hurts even more Pain Location: back Pain Descriptors / Indicators: Discomfort, Guarding Pain Intervention(s): Limited activity within patient's tolerance, Repositioned     Extremity/Trunk Assessment Upper Extremity Assessment Upper Extremity Assessment: Generalized weakness   Lower Extremity Assessment Lower Extremity Assessment: Generalized weakness       Communication Communication Communication: No apparent difficulties   Cognition Arousal: Alert Behavior During Therapy: WFL for tasks assessed/performed Overall Cognitive Status: Within Functional Limits for tasks assessed                                 General Comments: self-limiting mobility  Home Living Family/patient expects to be discharged to:: Private residence Living Arrangements: Spouse/significant other Available Help at Discharge: Family;Available 24 hours/day Type of Home: House Home Access: Ramped entrance     Home Layout: One level     Bathroom Shower/Tub: Walk-in shower;Tub/shower unit   Bathroom Toilet: Standard     Home Equipment: Agricultural consultant (2 wheels);Wheelchair - Dentist;Other  (comment)   Additional Comments: has bilat platforms for RW, has dressing stick      Prior Functioning/Environment Prior Level of Function : Needs assist             Mobility Comments: in bed, will sit on edge of bed for ADLs/exercise; reports walking 12 steps with HHPT ADLs Comments: assist for bathing (sponge baths), dressing, toileting (pure wik), all IADLs at this time        OT Problem List: Decreased strength;Decreased activity tolerance;Decreased knowledge of use of DME or AE;Impaired balance (sitting and/or standing);Impaired UE functional use;Pain      OT Treatment/Interventions: Self-care/ADL training;Therapeutic exercise;Energy conservation;DME and/or AE instruction;Therapeutic activities;Patient/family education;Balance training    OT Goals(Current goals can be found in the care plan section) Acute Rehab OT Goals Patient Stated Goal: to go home OT Goal Formulation: With patient Time For Goal Achievement: 07/27/23 Potential to Achieve Goals: Fair ADL Goals Pt Will Perform Grooming: with set-up;sitting Pt Will Perform Lower Body Dressing: with mod assist;sitting/lateral leans Pt Will Transfer to Toilet: with min assist;stand pivot transfer;bedside commode  OT Frequency: Min 1X/week    Co-evaluation PT/OT/SLP Co-Evaluation/Treatment: Yes Reason for Co-Treatment: For patient/therapist safety;To address functional/ADL transfers PT goals addressed during session: Mobility/safety with mobility OT goals addressed during session: ADL's and self-care      AM-PAC OT "6 Clicks" Daily Activity     Outcome Measure Help from another person eating meals?: None Help from another person taking care of personal grooming?: A Little Help from another person toileting, which includes using toliet, bedpan, or urinal?: A Lot Help from another person bathing (including washing, rinsing, drying)?: A Little Help from another person to put on and taking off regular upper body  clothing?: A Little Help from another person to put on and taking off regular lower body clothing?: A Lot 6 Click Score: 17   End of Session    Activity Tolerance: Patient limited by pain Patient left: in bed;with call bell/phone within reach;with bed alarm set  OT Visit Diagnosis: Other abnormalities of gait and mobility (R26.89);Muscle weakness (generalized) (M62.81)                Time: 4098-1191 OT Time Calculation (min): 16 min Charges:  OT General Charges $OT Visit: 1 Visit OT Evaluation $OT Eval Low Complexity: 1 Low  Kathie Dike, M.S. OTR/L  07/13/23, 1:43 PM  ascom [phone]

## 2023-07-14 ENCOUNTER — Encounter: Payer: Self-pay | Admitting: Internal Medicine

## 2023-07-14 DIAGNOSIS — R197 Diarrhea, unspecified: Secondary | ICD-10-CM | POA: Diagnosis not present

## 2023-07-14 LAB — MAGNESIUM: Magnesium: 1.8 mg/dL (ref 1.7–2.4)

## 2023-07-14 MED ORDER — PANCRELIPASE (LIP-PROT-AMYL) 36000-114000 UNITS PO CPEP: 36000.0000 [IU] | ORAL_CAPSULE | Freq: Three times a day (TID) | ORAL | 0 refills | Status: AC

## 2023-07-14 NOTE — Progress Notes (Signed)
Patient d/c via EMS with all personal belongings and AVS, verified d/c understanding via teach back method, patients IV removed and tolerated well.

## 2023-07-14 NOTE — Plan of Care (Signed)
  Problem: Education: Goal: Knowledge of General Education information will improve Description: Including pain rating scale, medication(s)/side effects and non-pharmacologic comfort measures Outcome: Progressing   Problem: Clinical Measurements: Goal: Ability to maintain clinical measurements within normal limits will improve Outcome: Progressing   Problem: Pain Management: Goal: General experience of comfort will improve Outcome: Progressing   Problem: Safety: Goal: Ability to remain free from injury will improve Outcome: Progressing

## 2023-07-14 NOTE — TOC Transition Note (Signed)
Transition of Care [facility]) - CM/SW Discharge Note   Patient Details  Name: KALYNNE WOMAC MRN: [id number] Date of Birth: 03/14/1951  Transition of [facility]) CM/SW Contact:  Luvenia Redden, RN Phone Number: 07/14/2023, 12:52 PM   Clinical Narrative:    Received order pt will be discharged home today and EMS would be needed. RN TOC spoke with pt and she has requested [facility] for transport unable to get into a van and usually uses this agency for her transportation. States she has a supportive spouse Selena Batten) and someone would be home to assistance. Pt states she has all her DME with no other needs at this time. RN TOC called all available numbers and spoke with family via mobile to verify pt will be next on list for EMS transport home. RN TOC also verified HHPT agency. She indicated she would like to resume [facility] services.   Medical necessity and facesheet printed for [facility]. Contacted [facility] spoke with Katina-accepted services for HHPT. Team updated accordingly.   No additional needs. TOC signing off.    Final next level of care: Home w Home Health Services Barriers to Discharge: No Barriers Identified   Patient Goals and CMS Choice CMS Medicare.gov Compare Post Acute Care list provided to:: Patient Choice offered to / list presented to : Patient  Discharge Placement                  Patient to be transferred to facility by: [facility] Name of family member notified: Pt reports she has supportive personnal at her home awaiting her arrival (spouse) anytime today.  RN ROC called residence to verified someone would be at home. Patient and family notified of of transfer: 07/14/23  Discharge Plan and Services Additional resources added to the After Visit Summary for   In-house Referral: Clinical Social Work Discharge Planning Services: NA Post Acute Care Choice: Home Health          DME Arranged: N/A DME Agency: NA       HH Arranged:  ([facility] in  place) HH Agency: [facility] Date [facility] Agency Contacted: 07/14/23 Time HH Agency Contacted: [phone] Representative spoke with at [facility] Agency: Laurelyn Sickle  Social Determinants of Health (SDOH) Interventions SDOH Screenings   Food Insecurity: No Food Insecurity (07/13/2023)  Housing: High Risk (07/13/2023)  Transportation Needs: No Transportation Needs (07/13/2023)  Utilities: Not At Risk (07/13/2023)  Alcohol Screen: Low Risk  (08/10/2020)  Depression (PHQ2-9): Low Risk  (08/10/2020)  Financial Resource Strain: Low Risk  (04/24/2022)   Received from [facility], [facility]  Physical Activity: Inactive (08/10/2020)  Social Connections: Moderately Integrated (08/10/2020)  Stress: No Stress Concern Present (08/10/2020)  Tobacco Use: Medium Risk (07/14/2023)     Readmission Risk Interventions    07/11/2023   12:28 PM 07/01/2023   10:31 AM 03/01/2023   10:56 AM  Readmission Risk Prevention Plan  Transportation Screening Complete Complete Complete  PCP or Specialist Appt within 3-5 Days Complete  Complete  HRI or Home Care Consult Complete  Complete  Social Work Consult for Recovery Care Planning/Counseling Complete  Complete  Palliative Care Screening Complete  Not Applicable  Medication Review Oceanographer) Complete  Complete  PCP or Specialist appointment within 3-5 days of discharge  Complete   HRI or Home Care Consult  Complete   SW Recovery Care/Counseling Consult  Complete   Palliative Care Screening  Not Applicable   Skilled Nursing Facility  Not Applicable

## 2023-07-14 NOTE — Plan of Care (Signed)

## 2023-07-14 NOTE — Discharge Summary (Signed)
Physician Discharge Summary  Jeanne [name] LKG:[id number] DOB: 16-Jan-1951 DOA: 07/10/2023  PCP: Jerl Mina, MD  Admit date: 07/10/2023 Discharge date: 07/14/2023  Admitted From: home  Disposition:  home   Recommendations for Outpatient Follow-up:  Follow up with PCP in 1-2 weeks F/u w/ GI, Dr. Norma Fredrickson, in 1-3 weeks   Home Health: no  Equipment/Devices:  Discharge Condition: stable  CODE STATUS: full  Diet recommendation: Regular   Brief/Interim Summary: HPI was taken from Dr. Chipper Herb:  Jeanne [name] is a 72 y.o. female with medical history significant of recurrent C. difficile colitis, rheumatoid arthritis, HTN, CKD stage IIIb with chronic left-sided hydronephrosis, cervical cancer status post radiation therapy, presented with sudden onset of abdominal pain and diarrhea.   Patient was recently diagnosed with recurrent C. difficile colitis was discharged on tapering dose of vancomycin on last Friday, as of yesterday patient is on 125 mg every 6 hours.  Yesterday evening, patient started to have cramping-like abdominal pain and followed by 4 episode of moderate amount of watery diarrhea, denied any tenesmus.  No fever chills no nauseous vomiting.   ED Course: Afebrile, borderline tachycardia, blood pressure 120/60, blood work showed WBC 28.9, hemoglobin 13, BUN 19, creatinine 1.0, K3.3, bicarb 24.  CT abdomen pelvis showed no acute abnormalities in abdomen.  Unchanged left hydronephrosis and hydroureter without obstructing calculus.  Discharge Diagnoses:  Principal Problem:   Sepsis due to undetermined organism [facility]) Active Problems:   C. difficile colitis   Chronic diarrhea Diarrhea: continue on fidaxomicin while inpatient and will continue w/ po vanco taper at d/c. C. Diff test neg. GI PCR panel was neg    Leukocytosis: resolved    Chronic left-sided obstructive uropathy: stable on today's CT scan. Recommend outpatient follow-up with urology.  Discharge  Instructions  Discharge Instructions     Diet general   Complete by: As directed    Discharge instructions   Complete by: As directed    F/u w/ GI, Dr. Norma Fredrickson, in 1-3 weeks. F/u w/ PCP in 1-2 weeks   Increase activity slowly   Complete by: As directed       Allergies as of 07/14/2023       Reactions   Codeine Nausea Only, Nausea And Vomiting   Augmentin [amoxicillin-pot Clavulanate] Nausea Only   Very nauseated/ feels sore on body    Ceftriaxone Other (See Comments)   "Pins and needles" sensation in legs        Medication List     TAKE these medications    acidophilus Caps capsule Take 1 capsule by mouth daily.   conjugated estrogens vaginal cream Commonly known as: PREMARIN Place vaginally 3 (three) times a week.   diclofenac 75 MG EC tablet Commonly known as: VOLTAREN Take 75 mg by mouth 2 (two) times daily.   gabapentin 100 MG capsule Commonly known as: NEURONTIN Take 100 mg by mouth at bedtime.   Gerhardt's butt cream Crea Apply 1 Application topically 3 (three) times daily.   oxyCODONE 5 MG immediate release tablet Commonly known as: Oxy IR/ROXICODONE Take 1 tablet (5 mg total) by mouth every 6 (six) hours as needed.   promethazine 25 MG tablet Commonly known as: PHENERGAN Take 12.5 mg by mouth every 6 (six) hours as needed for nausea.   vancomycin 125 MG capsule Commonly known as: VANCOCIN Take 1 capsule (125 mg total) by mouth 4 (four) times daily for 12 days, THEN 1 capsule (125 mg total) 2 (two) times daily for 7 days,  THEN 1 capsule (125 mg total) daily for 7 days, THEN 1 capsule (125 mg total) every other day for 28 days, THEN 1 capsule (125 mg total) every 3 (three) days for 28 days. Start taking on: July 05, 2023        Allergies  Allergen Reactions   Codeine Nausea Only and Nausea And Vomiting   Augmentin [Amoxicillin-Pot Clavulanate] Nausea Only    Very nauseated/ feels sore on body    Ceftriaxone Other (See Comments)    "Pins  and needles" sensation in legs    Consultations: GI    Procedures/Studies: CT ABDOMEN PELVIS W CONTRAST  Result Date: 07/11/2023 CLINICAL DATA:  Lower abdominal pain. Recent C diff infection. Diarrhea. EXAM: CT ABDOMEN AND PELVIS WITH CONTRAST TECHNIQUE: Multidetector CT imaging of the abdomen and pelvis was performed using the standard protocol following bolus administration of intravenous contrast. RADIATION DOSE REDUCTION: This exam was performed according to the departmental dose-optimization program which includes automated exposure control, adjustment of the mA and/or kV according to patient size and/or use of iterative reconstruction technique. CONTRAST:  80mL OMNIPAQUE IOHEXOL 300 MG/ML  SOLN COMPARISON:  CT abdomen and pelvis 06/29/2023 FINDINGS: Lower chest: No acute abnormality. Hepatobiliary: Mild hepatic steatosis. Layering sludge in the gallbladder. No cholecystitis or biliary dilation. Pancreas: Fatty atrophy without acute abnormality. Spleen: Mild splenomegaly. Adrenals/Urinary Tract: Stable adrenal glands. Left greater than right renal parenchymal atrophy. Prominent parapelvic cysts bilaterally. No follow-up recommended. Nonobstructing punctate right nephrolithiasis. Unchanged left hydronephrosis and hydroureter without obstructing calculus. Streak artifact limits assessment of the distal ureter an obstructing mass is not excluded. Unremarkable bladder were visualized. Stomach/Bowel: The rectum is not well evaluated due to streak artifact. Normal caliber large and small bowel without bowel wall thickening. Stomach is within normal limits. The appendix is not definitively visualized. No secondary signs of appendicitis. Vascular/Lymphatic: Aortic atherosclerosis. No enlarged abdominal or pelvic lymph nodes. Reproductive: Streak artifact limits assessment.  Hysterectomy. Other: Question trace free fluid in the pelvis. No free intraperitoneal air. Musculoskeletal: Demineralization. Bilateral  THAs. Streak artifact limits assessment for acute fracture. A screw of the right THA extends into the right pelvis and exerts mass effect on the right external iliac artery and vein. Unchanged compression fractures of T12, L1, and L5. Probable sacral insufficiency fracture. Edema or contusion in both flanks. IMPRESSION: 1. No acute abnormality in the abdomen or pelvis. 2. Unchanged left hydronephrosis and hydroureter without obstructing calculus. Streak artifact limits assessment of the distal ureter and obstructing mass is not excluded. 3. Nonobstructing punctate right nephrolithiasis. 4. Mild hepatic steatosis. 5. Mild splenomegaly. 6. Probable sacral insufficiency fracture. Aortic Atherosclerosis (ICD10-I70.0). Electronically Signed   By: Minerva Fester M.D.   On: 07/11/2023 04:09   DG Chest Port 1 View  Result Date: 07/10/2023 CLINICAL DATA:  Questionable sepsis EXAM: PORTABLE CHEST 1 VIEW COMPARISON:  Chest x-ray 05/01/2022 FINDINGS: The heart size and mediastinal contours are within normal limits. Calcified right paratracheal lymph node again noted. Both lungs are clear. T there are degenerative changes of both shoulders. He visualized skeletal structures are unremarkable. IMPRESSION: No active disease. Electronically Signed   By: Darliss Cheney M.D.   On: 07/10/2023 23:18   CT ABDOMEN PELVIS W CONTRAST  Result Date: 06/30/2023 CLINICAL DATA:  Acute nonlocalized abdominal pain. Diarrhea and abdominal pain for 2 hours. Vomiting. History of C difficile. EXAM: CT ABDOMEN AND PELVIS WITH CONTRAST TECHNIQUE: Multidetector CT imaging of the abdomen and pelvis was performed using the standard protocol following bolus administration  of intravenous contrast. RADIATION DOSE REDUCTION: This exam was performed according to the departmental dose-optimization program which includes automated exposure control, adjustment of the mA and/or kV according to patient size and/or use of iterative reconstruction technique.  CONTRAST:  80mL OMNIPAQUE IOHEXOL 300 MG/ML  SOLN COMPARISON:  05/25/2023 FINDINGS: Lower chest: Lung bases are clear. Hepatobiliary: No focal liver abnormality is seen. No gallstones, gallbladder wall thickening, or biliary dilatation. Pancreas: Fatty infiltration of the pancreas. No acute inflammatory changes. Spleen: Normal in size without focal abnormality. Adrenals/Urinary Tract: No adrenal gland nodules. Bilateral renal parenchymal atrophy, more severe on the left. Prominent parapelvic cysts in the right. No imaging follow-up is indicated. Right renal collecting system and ureter are decompressed. Two right intrarenal stones, largest measuring 2 mm diameter. The left kidney demonstrates hydronephrosis with prominent hydroureter. Distal ureter is not visualized due to streak artifact from bilateral hip arthroplasties. Ureterectasis is increased since the prior study. A distal ureteral stone or possibly an obstructing lesion can not be excluded. The bladder is also mostly obscured visualization. There appears to be gas in the bladder which could indicate cystitis or previous catheterization. Stomach/Bowel: Stomach is filled with ingested material. No wall thickening. Small bowel, and colon are not abnormally distended. No wall thickening or inflammatory changes are appreciated. Visualization of the rectosigmoid colon is limited due to streak artifact from the hip prostheses. Appendix is not identified. Vascular/Lymphatic: No significant vascular findings are present. No enlarged abdominal or pelvic lymph nodes. Surgical clips in the paracaval upper pelvis. Reproductive: No pelvic mass identified. Other: No free air or free fluid in the abdomen. Stranding in the right flank and hip soft tissues likely representing contusion or edema. Musculoskeletal: Bilateral hip arthroplasties. Streak artifact limits evaluation. Compression deformities at T12, L1, and L5. No change since prior study. No acute bony abnormalities  are identified. IMPRESSION: 1. Nonobstructing intrarenal stones on the right. Right parapelvic cysts. No imaging follow-up is indicated. 2. Severe left renal atrophy. Hydronephrosis and hydroureter on the left appears progressed since prior study but may be chronic, resulting in the atrophy. The distal ureter is not visualized due to streak artifact from hip prostheses and a distal obstructing stone is not excluded. 3. No evidence of bowel obstruction or inflammation. 4. Aortic atherosclerosis. Electronically Signed   By: Burman Nieves M.D.   On: 06/30/2023 00:18   (Echo, Carotid, EGD, Colonoscopy, ERCP)    Subjective: Pt c/o fatigue    Discharge Exam: Vitals:   07/14/23 0437 07/14/23 1022  BP: (!) 109/49 (!) 105/59  Pulse: 72 96  Resp: 15 16  Temp: 98.2 F (36.8 C)   SpO2: 100% 99%   Vitals:   07/13/23 1542 07/13/23 2143 07/14/23 0437 07/14/23 1022  BP: (!) 124/57 (!) 100/53 (!) 109/49 (!) 105/59  Pulse: 81 73 72 96  Resp: 18 16 15 16   Temp: 97.8 F (36.6 C) 98.2 F (36.8 C) 98.2 F (36.8 C)   TempSrc: Oral Oral Oral   SpO2: 100% 99% 100% 99%  Weight:        General: Pt is alert, awake, not in acute distress Cardiovascular: S1/S2 +, no rubs, no gallops Respiratory: CTA bilaterally, no wheezing, no rhonchi Abdominal: Soft, NT, ND, bowel sounds + Extremities:  no cyanosis    The results of significant diagnostics from this hospitalization (including imaging, microbiology, ancillary and laboratory) are listed below for reference.     Microbiology: Recent Results (from the past 240 hour(s))  Blood Culture (routine x 2)  Status: None (Preliminary result)   Collection Time: 07/10/23 10:30 PM   Specimen: BLOOD  Result Value Ref Range Status   Specimen Description BLOOD LEFT ASSIST CONTROL  Final   Special Requests   Final    BOTTLES DRAWN AEROBIC AND ANAEROBIC Blood Culture adequate volume   Culture   Final    NO GROWTH 4 DAYS Performed at [facility],  [address], [place], [place] [address]    Report Status PENDING  Incomplete  Blood Culture (routine x 2)     Status: None (Preliminary result)   Collection Time: 07/11/23  1:10 AM   Specimen: BLOOD  Result Value Ref Range Status   Specimen Description BLOOD RIGHT WRIST  Final   Special Requests   Final    BOTTLES DRAWN AEROBIC AND ANAEROBIC Blood Culture adequate volume   Culture   Final    NO GROWTH 3 DAYS Performed at [facility], [address] [place], [place] [address]    Report Status PENDING  Incomplete  C Difficile Quick Screen w PCR reflex     Status: None   Collection Time: 07/11/23 10:02 AM   Specimen: STOOL  Result Value Ref Range Status   C Diff antigen NEGATIVE NEGATIVE Final   C Diff toxin NEGATIVE NEGATIVE Final   C Diff interpretation No C. difficile detected.  Final    Comment: Performed at [facility], [address] [place], [place] [address]  Gastrointestinal Panel by PCR , Stool     Status: None   Collection Time: 07/11/23 10:02 AM   Specimen: Stool  Result Value Ref Range Status   Campylobacter species NOT DETECTED NOT DETECTED Final   Plesimonas shigelloides NOT DETECTED NOT DETECTED Final   Salmonella species NOT DETECTED NOT DETECTED Final   Yersinia enterocolitica NOT DETECTED NOT DETECTED Final   Vibrio species NOT DETECTED NOT DETECTED Final   Vibrio cholerae NOT DETECTED NOT DETECTED Final   Enteroaggregative E coli (EAEC) NOT DETECTED NOT DETECTED Final   Enteropathogenic E coli (EPEC) NOT DETECTED NOT DETECTED Final   Enterotoxigenic E coli (ETEC) NOT DETECTED NOT DETECTED Final   Shiga like toxin producing E coli (STEC) NOT DETECTED NOT DETECTED Final   Shigella/Enteroinvasive E coli (EIEC) NOT DETECTED NOT DETECTED Final   Cryptosporidium NOT DETECTED NOT DETECTED Final   Cyclospora cayetanensis NOT DETECTED NOT DETECTED Final   Entamoeba histolytica NOT DETECTED NOT DETECTED Final   Giardia lamblia NOT DETECTED  NOT DETECTED Final   Adenovirus F40/41 NOT DETECTED NOT DETECTED Final   Astrovirus NOT DETECTED NOT DETECTED Final   Norovirus GI/GII NOT DETECTED NOT DETECTED Final   Rotavirus A NOT DETECTED NOT DETECTED Final   Sapovirus (I, II, IV, and V) NOT DETECTED NOT DETECTED Final    Comment: Performed at [facility], [address] [name], [place] [address]     Labs: BNP (last 3 results) No results for input(s): "BNP" in the last 8760 hours. Basic Metabolic Panel: Recent Labs  Lab 07/10/23 2249 07/11/23 0110 07/12/23 0602 07/14/23 0408  NA 139  --  140  --   K 3.3*  --  4.5  --   CL 108  --  110  --   CO2 24  --  22  --   GLUCOSE 148*  --  92  --   BUN 19  --  13  --   CREATININE 1.05*  --  1.00  --   CALCIUM 9.0  --  8.7*  --   MG  --  1.6*  --  1.8   Liver Function Tests: Recent Labs  Lab 07/10/23 2249  AST 28  ALT 22  ALKPHOS 157*  BILITOT 0.8  PROT 7.6  ALBUMIN 3.5   Recent Labs  Lab 07/10/23 2249  LIPASE 25   No results for input(s): "AMMONIA" in the last 168 hours. CBC: Recent Labs  Lab 07/10/23 2249 07/12/23 0602  WBC 28.9* 8.3  NEUTROABS 27.2*  --   HGB 13.0 11.4*  HCT 40.1 36.8  MCV 88.9 91.5  PLT 324 265   Cardiac Enzymes: No results for input(s): "CKTOTAL", "CKMB", "CKMBINDEX", "TROPONINI" in the last 168 hours. BNP: Invalid input(s): "POCBNP" CBG: No results for input(s): "GLUCAP" in the last 168 hours. D-Dimer No results for input(s): "DDIMER" in the last 72 hours. Hgb A1c No results for input(s): "HGBA1C" in the last 72 hours. Lipid Profile No results for input(s): "CHOL", "HDL", "LDLCALC", "TRIG", "CHOLHDL", "LDLDIRECT" in the last 72 hours. Thyroid function studies No results for input(s): "TSH", "T4TOTAL", "T3FREE", "THYROIDAB" in the last 72 hours.  Invalid input(s): "FREET3" Anemia work up No results for input(s): "VITAMINB12", "FOLATE", "FERRITIN", "TIBC", "IRON", "RETICCTPCT" in the last 72 hours. Urinalysis     Component Value Date/Time   COLORURINE YELLOW (A) 06/30/2023 0307   APPEARANCEUR CLOUDY (A) 06/30/2023 0307   APPEARANCEUR Cloudy (A) 10/21/2018 0841   LABSPEC 1.033 (H) 06/30/2023 0307   PHURINE 6.0 06/30/2023 0307   GLUCOSEU NEGATIVE 06/30/2023 0307   HGBUR MODERATE (A) 06/30/2023 0307   BILIRUBINUR NEGATIVE 06/30/2023 0307   BILIRUBINUR negative 07/15/2020 0827   BILIRUBINUR Negative 10/21/2018 0841   KETONESUR NEGATIVE 06/30/2023 0307   PROTEINUR NEGATIVE 06/30/2023 0307   UROBILINOGEN 0.2 07/15/2020 0827   NITRITE POSITIVE (A) 06/30/2023 0307   LEUKOCYTESUR LARGE (A) 06/30/2023 0307   Sepsis Labs Recent Labs  Lab 07/10/23 2249 07/12/23 0602  WBC 28.9* 8.3   Microbiology Recent Results (from the past 240 hour(s))  Blood Culture (routine x 2)     Status: None (Preliminary result)   Collection Time: 07/10/23 10:30 PM   Specimen: BLOOD  Result Value Ref Range Status   Specimen Description BLOOD LEFT ASSIST CONTROL  Final   Special Requests   Final    BOTTLES DRAWN AEROBIC AND ANAEROBIC Blood Culture adequate volume   Culture   Final    NO GROWTH 4 DAYS Performed at [facility], [address], [place], [place] [address]    Report Status PENDING  Incomplete  Blood Culture (routine x 2)     Status: None (Preliminary result)   Collection Time: 07/11/23  1:10 AM   Specimen: BLOOD  Result Value Ref Range Status   Specimen Description BLOOD RIGHT WRIST  Final   Special Requests   Final    BOTTLES DRAWN AEROBIC AND ANAEROBIC Blood Culture adequate volume   Culture   Final    NO GROWTH 3 DAYS Performed at [facility], [address], [place], [place] [address]    Report Status PENDING  Incomplete  C Difficile Quick Screen w PCR reflex     Status: None   Collection Time: 07/11/23 10:02 AM   Specimen: STOOL  Result Value Ref Range Status   C Diff antigen NEGATIVE NEGATIVE Final   C Diff toxin NEGATIVE NEGATIVE Final   C Diff interpretation No C.  difficile detected.  Final    Comment: Performed at [facility], [address]  [place], [place] [address]  Gastrointestinal Panel by PCR , Stool     Status: None   Collection Time: 07/11/23 10:02 AM   Specimen: Stool  Result Value Ref Range Status   Campylobacter species NOT DETECTED NOT DETECTED Final   Plesimonas shigelloides NOT DETECTED NOT DETECTED Final   Salmonella species NOT DETECTED NOT DETECTED Final   Yersinia enterocolitica NOT DETECTED NOT DETECTED Final   Vibrio species NOT DETECTED NOT DETECTED Final   Vibrio cholerae NOT DETECTED NOT DETECTED Final   Enteroaggregative E coli (EAEC) NOT DETECTED NOT DETECTED Final   Enteropathogenic E coli (EPEC) NOT DETECTED NOT DETECTED Final   Enterotoxigenic E coli (ETEC) NOT DETECTED NOT DETECTED Final   Shiga like toxin producing E coli (STEC) NOT DETECTED NOT DETECTED Final   Shigella/Enteroinvasive E coli (EIEC) NOT DETECTED NOT DETECTED Final   Cryptosporidium NOT DETECTED NOT DETECTED Final   Cyclospora cayetanensis NOT DETECTED NOT DETECTED Final   Entamoeba histolytica NOT DETECTED NOT DETECTED Final   Giardia lamblia NOT DETECTED NOT DETECTED Final   Adenovirus F40/41 NOT DETECTED NOT DETECTED Final   Astrovirus NOT DETECTED NOT DETECTED Final   Norovirus GI/GII NOT DETECTED NOT DETECTED Final   Rotavirus A NOT DETECTED NOT DETECTED Final   Sapovirus (I, II, IV, and V) NOT DETECTED NOT DETECTED Final    Comment: Performed at [facility], [address], [place], [place] [address]     Time coordinating discharge: Over 30 minutes  SIGNED:   Charise Killian, MD  [facility] 07/14/2023, 12:28 PM Pager   If 7PM-7AM, please contact night-coverage

## 2023-07-15 LAB — CULTURE, BLOOD (ROUTINE X 2)
Culture: NO GROWTH
Special Requests: ADEQUATE

## 2023-07-16 LAB — CULTURE, BLOOD (ROUTINE X 2)
Culture: NO GROWTH
Special Requests: ADEQUATE

## 2023-07-16 LAB — PANCREATIC ELASTASE, FECAL: Pancreatic Elastase-1, Stool: 53 ug Elast./g — ABNORMAL LOW (ref >200)

## 2023-07-17 ENCOUNTER — Telehealth: Payer: Self-pay

## 2023-07-17 NOTE — Telephone Encounter (Signed)
Patient verbalized understanding of results  

## 2023-07-17 NOTE — Telephone Encounter (Signed)
-----   Message from [facility] sent at 07/16/2023  5:02 PM EST ----- Jeanne [name]  Please inform patient that the pancreatic fecal elastase levels came back very low which goes along with pancreatic insufficiency secondary to pancreatic atrophy. We started her on pancreatic enzymes in the hospital.  She should continue taking these enzymes and follow-up with Dr. Norma Fredrickson who is her primary gastroenterologist  Thanks Lannette Donath

## 2023-07-21 DIAGNOSIS — A4101 Sepsis due to Methicillin susceptible Staphylococcus aureus: Secondary | ICD-10-CM | POA: Diagnosis not present

## 2023-07-21 DIAGNOSIS — E538 Deficiency of other specified B group vitamins: Secondary | ICD-10-CM | POA: Diagnosis not present

## 2023-07-21 DIAGNOSIS — N1832 Chronic kidney disease, stage 3b: Secondary | ICD-10-CM | POA: Diagnosis not present

## 2023-07-21 DIAGNOSIS — Z8541 Personal history of malignant neoplasm of cervix uteri: Secondary | ICD-10-CM | POA: Diagnosis not present

## 2023-07-21 DIAGNOSIS — I130 Hypertensive heart and chronic kidney disease with heart failure and stage 1 through stage 4 chronic kidney disease, or unspecified chronic kidney disease: Secondary | ICD-10-CM | POA: Diagnosis not present

## 2023-07-21 DIAGNOSIS — Z8616 Personal history of COVID-19: Secondary | ICD-10-CM | POA: Diagnosis not present

## 2023-07-21 DIAGNOSIS — M069 Rheumatoid arthritis, unspecified: Secondary | ICD-10-CM | POA: Diagnosis not present

## 2023-07-21 DIAGNOSIS — N136 Pyonephrosis: Secondary | ICD-10-CM | POA: Diagnosis not present

## 2023-07-21 DIAGNOSIS — A4150 Gram-negative sepsis, unspecified: Secondary | ICD-10-CM | POA: Diagnosis not present

## 2023-07-21 DIAGNOSIS — A0472 Enterocolitis due to Clostridium difficile, not specified as recurrent: Secondary | ICD-10-CM | POA: Diagnosis not present

## 2023-07-21 DIAGNOSIS — I5030 Unspecified diastolic (congestive) heart failure: Secondary | ICD-10-CM | POA: Diagnosis not present

## 2023-07-21 DIAGNOSIS — D631 Anemia in chronic kidney disease: Secondary | ICD-10-CM | POA: Diagnosis not present

## 2023-07-21 DIAGNOSIS — Z96641 Presence of right artificial hip joint: Secondary | ICD-10-CM | POA: Diagnosis not present

## 2023-07-21 DIAGNOSIS — I959 Hypotension, unspecified: Secondary | ICD-10-CM | POA: Diagnosis not present

## 2023-07-21 DIAGNOSIS — E559 Vitamin D deficiency, unspecified: Secondary | ICD-10-CM | POA: Diagnosis not present

## 2023-07-23 DIAGNOSIS — M069 Rheumatoid arthritis, unspecified: Secondary | ICD-10-CM | POA: Diagnosis not present

## 2023-07-23 DIAGNOSIS — K8689 Other specified diseases of pancreas: Secondary | ICD-10-CM | POA: Diagnosis not present

## 2023-07-23 DIAGNOSIS — R300 Dysuria: Secondary | ICD-10-CM | POA: Diagnosis not present

## 2023-07-23 DIAGNOSIS — X58XXXA Exposure to other specified factors, initial encounter: Secondary | ICD-10-CM | POA: Diagnosis not present

## 2023-07-23 DIAGNOSIS — N390 Urinary tract infection, site not specified: Secondary | ICD-10-CM | POA: Diagnosis not present

## 2023-07-23 DIAGNOSIS — T8450XA Infection and inflammatory reaction due to unspecified internal joint prosthesis, initial encounter: Secondary | ICD-10-CM | POA: Diagnosis not present

## 2023-07-23 DIAGNOSIS — A0471 Enterocolitis due to Clostridium difficile, recurrent: Secondary | ICD-10-CM | POA: Diagnosis not present

## 2023-07-23 DIAGNOSIS — G629 Polyneuropathy, unspecified: Secondary | ICD-10-CM | POA: Diagnosis not present

## 2023-07-23 DIAGNOSIS — T8450XS Infection and inflammatory reaction due to unspecified internal joint prosthesis, sequela: Secondary | ICD-10-CM | POA: Diagnosis not present

## 2023-07-25 DIAGNOSIS — I5030 Unspecified diastolic (congestive) heart failure: Secondary | ICD-10-CM | POA: Diagnosis not present

## 2023-07-25 DIAGNOSIS — I130 Hypertensive heart and chronic kidney disease with heart failure and stage 1 through stage 4 chronic kidney disease, or unspecified chronic kidney disease: Secondary | ICD-10-CM | POA: Diagnosis not present

## 2023-07-25 DIAGNOSIS — A4101 Sepsis due to Methicillin susceptible Staphylococcus aureus: Secondary | ICD-10-CM | POA: Diagnosis not present

## 2023-07-25 DIAGNOSIS — A0472 Enterocolitis due to Clostridium difficile, not specified as recurrent: Secondary | ICD-10-CM | POA: Diagnosis not present

## 2023-07-25 DIAGNOSIS — Z8616 Personal history of COVID-19: Secondary | ICD-10-CM | POA: Diagnosis not present

## 2023-07-25 DIAGNOSIS — I959 Hypotension, unspecified: Secondary | ICD-10-CM | POA: Diagnosis not present

## 2023-07-25 DIAGNOSIS — E559 Vitamin D deficiency, unspecified: Secondary | ICD-10-CM | POA: Diagnosis not present

## 2023-07-25 DIAGNOSIS — D631 Anemia in chronic kidney disease: Secondary | ICD-10-CM | POA: Diagnosis not present

## 2023-07-25 DIAGNOSIS — N1832 Chronic kidney disease, stage 3b: Secondary | ICD-10-CM | POA: Diagnosis not present

## 2023-07-25 DIAGNOSIS — E538 Deficiency of other specified B group vitamins: Secondary | ICD-10-CM | POA: Diagnosis not present

## 2023-07-25 DIAGNOSIS — N136 Pyonephrosis: Secondary | ICD-10-CM | POA: Diagnosis not present

## 2023-07-25 DIAGNOSIS — Z96641 Presence of right artificial hip joint: Secondary | ICD-10-CM | POA: Diagnosis not present

## 2023-07-25 DIAGNOSIS — M069 Rheumatoid arthritis, unspecified: Secondary | ICD-10-CM | POA: Diagnosis not present

## 2023-07-25 DIAGNOSIS — Z8541 Personal history of malignant neoplasm of cervix uteri: Secondary | ICD-10-CM | POA: Diagnosis not present

## 2023-07-25 DIAGNOSIS — A4150 Gram-negative sepsis, unspecified: Secondary | ICD-10-CM | POA: Diagnosis not present

## 2023-07-30 DIAGNOSIS — Z96641 Presence of right artificial hip joint: Secondary | ICD-10-CM | POA: Diagnosis not present

## 2023-07-30 DIAGNOSIS — D631 Anemia in chronic kidney disease: Secondary | ICD-10-CM | POA: Diagnosis not present

## 2023-07-30 DIAGNOSIS — N136 Pyonephrosis: Secondary | ICD-10-CM | POA: Diagnosis not present

## 2023-07-30 DIAGNOSIS — A4101 Sepsis due to Methicillin susceptible Staphylococcus aureus: Secondary | ICD-10-CM | POA: Diagnosis not present

## 2023-07-30 DIAGNOSIS — A4150 Gram-negative sepsis, unspecified: Secondary | ICD-10-CM | POA: Diagnosis not present

## 2023-07-30 DIAGNOSIS — Z8541 Personal history of malignant neoplasm of cervix uteri: Secondary | ICD-10-CM | POA: Diagnosis not present

## 2023-07-30 DIAGNOSIS — M069 Rheumatoid arthritis, unspecified: Secondary | ICD-10-CM | POA: Diagnosis not present

## 2023-07-30 DIAGNOSIS — N1832 Chronic kidney disease, stage 3b: Secondary | ICD-10-CM | POA: Diagnosis not present

## 2023-07-30 DIAGNOSIS — I959 Hypotension, unspecified: Secondary | ICD-10-CM | POA: Diagnosis not present

## 2023-07-30 DIAGNOSIS — E538 Deficiency of other specified B group vitamins: Secondary | ICD-10-CM | POA: Diagnosis not present

## 2023-07-30 DIAGNOSIS — I130 Hypertensive heart and chronic kidney disease with heart failure and stage 1 through stage 4 chronic kidney disease, or unspecified chronic kidney disease: Secondary | ICD-10-CM | POA: Diagnosis not present

## 2023-07-30 DIAGNOSIS — A0472 Enterocolitis due to Clostridium difficile, not specified as recurrent: Secondary | ICD-10-CM | POA: Diagnosis not present

## 2023-07-30 DIAGNOSIS — E559 Vitamin D deficiency, unspecified: Secondary | ICD-10-CM | POA: Diagnosis not present

## 2023-07-30 DIAGNOSIS — Z8616 Personal history of COVID-19: Secondary | ICD-10-CM | POA: Diagnosis not present

## 2023-07-30 DIAGNOSIS — I5030 Unspecified diastolic (congestive) heart failure: Secondary | ICD-10-CM | POA: Diagnosis not present

## 2023-08-02 DIAGNOSIS — E559 Vitamin D deficiency, unspecified: Secondary | ICD-10-CM | POA: Diagnosis not present

## 2023-08-02 DIAGNOSIS — D631 Anemia in chronic kidney disease: Secondary | ICD-10-CM | POA: Diagnosis not present

## 2023-08-02 DIAGNOSIS — N136 Pyonephrosis: Secondary | ICD-10-CM | POA: Diagnosis not present

## 2023-08-02 DIAGNOSIS — I130 Hypertensive heart and chronic kidney disease with heart failure and stage 1 through stage 4 chronic kidney disease, or unspecified chronic kidney disease: Secondary | ICD-10-CM | POA: Diagnosis not present

## 2023-08-02 DIAGNOSIS — Z96641 Presence of right artificial hip joint: Secondary | ICD-10-CM | POA: Diagnosis not present

## 2023-08-02 DIAGNOSIS — I5030 Unspecified diastolic (congestive) heart failure: Secondary | ICD-10-CM | POA: Diagnosis not present

## 2023-08-02 DIAGNOSIS — Z8541 Personal history of malignant neoplasm of cervix uteri: Secondary | ICD-10-CM | POA: Diagnosis not present

## 2023-08-02 DIAGNOSIS — A4150 Gram-negative sepsis, unspecified: Secondary | ICD-10-CM | POA: Diagnosis not present

## 2023-08-02 DIAGNOSIS — N1832 Chronic kidney disease, stage 3b: Secondary | ICD-10-CM | POA: Diagnosis not present

## 2023-08-02 DIAGNOSIS — M069 Rheumatoid arthritis, unspecified: Secondary | ICD-10-CM | POA: Diagnosis not present

## 2023-08-02 DIAGNOSIS — I959 Hypotension, unspecified: Secondary | ICD-10-CM | POA: Diagnosis not present

## 2023-08-02 DIAGNOSIS — Z8616 Personal history of COVID-19: Secondary | ICD-10-CM | POA: Diagnosis not present

## 2023-08-02 DIAGNOSIS — A4101 Sepsis due to Methicillin susceptible Staphylococcus aureus: Secondary | ICD-10-CM | POA: Diagnosis not present

## 2023-08-02 DIAGNOSIS — E538 Deficiency of other specified B group vitamins: Secondary | ICD-10-CM | POA: Diagnosis not present

## 2023-08-02 DIAGNOSIS — A0472 Enterocolitis due to Clostridium difficile, not specified as recurrent: Secondary | ICD-10-CM | POA: Diagnosis not present

## 2023-08-06 DIAGNOSIS — Z471 Aftercare following joint replacement surgery: Secondary | ICD-10-CM | POA: Diagnosis not present

## 2023-08-06 DIAGNOSIS — M85851 Other specified disorders of bone density and structure, right thigh: Secondary | ICD-10-CM | POA: Diagnosis not present

## 2023-08-06 DIAGNOSIS — Z96641 Presence of right artificial hip joint: Secondary | ICD-10-CM | POA: Diagnosis not present

## 2023-08-06 DIAGNOSIS — Z96643 Presence of artificial hip joint, bilateral: Secondary | ICD-10-CM | POA: Diagnosis not present

## 2023-08-13 DIAGNOSIS — Z993 Dependence on wheelchair: Secondary | ICD-10-CM | POA: Diagnosis not present

## 2023-08-13 DIAGNOSIS — N139 Obstructive and reflux uropathy, unspecified: Secondary | ICD-10-CM | POA: Diagnosis not present

## 2023-08-13 DIAGNOSIS — E538 Deficiency of other specified B group vitamins: Secondary | ICD-10-CM | POA: Diagnosis not present

## 2023-08-13 DIAGNOSIS — Z792 Long term (current) use of antibiotics: Secondary | ICD-10-CM | POA: Diagnosis not present

## 2023-08-13 DIAGNOSIS — Z87440 Personal history of urinary (tract) infections: Secondary | ICD-10-CM | POA: Diagnosis not present

## 2023-08-13 DIAGNOSIS — Z96643 Presence of artificial hip joint, bilateral: Secondary | ICD-10-CM | POA: Diagnosis not present

## 2023-08-13 DIAGNOSIS — E559 Vitamin D deficiency, unspecified: Secondary | ICD-10-CM | POA: Diagnosis not present

## 2023-08-13 DIAGNOSIS — I959 Hypotension, unspecified: Secondary | ICD-10-CM | POA: Diagnosis not present

## 2023-08-13 DIAGNOSIS — M069 Rheumatoid arthritis, unspecified: Secondary | ICD-10-CM | POA: Diagnosis not present

## 2023-08-13 DIAGNOSIS — Z87442 Personal history of urinary calculi: Secondary | ICD-10-CM | POA: Diagnosis not present

## 2023-08-13 DIAGNOSIS — I5030 Unspecified diastolic (congestive) heart failure: Secondary | ICD-10-CM | POA: Diagnosis not present

## 2023-08-13 DIAGNOSIS — I130 Hypertensive heart and chronic kidney disease with heart failure and stage 1 through stage 4 chronic kidney disease, or unspecified chronic kidney disease: Secondary | ICD-10-CM | POA: Diagnosis not present

## 2023-08-13 DIAGNOSIS — Z96651 Presence of right artificial knee joint: Secondary | ICD-10-CM | POA: Diagnosis not present

## 2023-08-13 DIAGNOSIS — Z8541 Personal history of malignant neoplasm of cervix uteri: Secondary | ICD-10-CM | POA: Diagnosis not present

## 2023-08-13 DIAGNOSIS — A0472 Enterocolitis due to Clostridium difficile, not specified as recurrent: Secondary | ICD-10-CM | POA: Diagnosis not present

## 2023-08-13 DIAGNOSIS — Z8616 Personal history of COVID-19: Secondary | ICD-10-CM | POA: Diagnosis not present

## 2023-08-13 DIAGNOSIS — D631 Anemia in chronic kidney disease: Secondary | ICD-10-CM | POA: Diagnosis not present

## 2023-08-13 DIAGNOSIS — N1832 Chronic kidney disease, stage 3b: Secondary | ICD-10-CM | POA: Diagnosis not present

## 2023-08-15 DIAGNOSIS — M069 Rheumatoid arthritis, unspecified: Secondary | ICD-10-CM | POA: Diagnosis not present

## 2023-08-15 DIAGNOSIS — A0472 Enterocolitis due to Clostridium difficile, not specified as recurrent: Secondary | ICD-10-CM | POA: Diagnosis not present

## 2023-08-15 DIAGNOSIS — I5030 Unspecified diastolic (congestive) heart failure: Secondary | ICD-10-CM | POA: Diagnosis not present

## 2023-08-15 DIAGNOSIS — Z87440 Personal history of urinary (tract) infections: Secondary | ICD-10-CM | POA: Diagnosis not present

## 2023-08-15 DIAGNOSIS — D631 Anemia in chronic kidney disease: Secondary | ICD-10-CM | POA: Diagnosis not present

## 2023-08-15 DIAGNOSIS — Z792 Long term (current) use of antibiotics: Secondary | ICD-10-CM | POA: Diagnosis not present

## 2023-08-15 DIAGNOSIS — I130 Hypertensive heart and chronic kidney disease with heart failure and stage 1 through stage 4 chronic kidney disease, or unspecified chronic kidney disease: Secondary | ICD-10-CM | POA: Diagnosis not present

## 2023-08-15 DIAGNOSIS — E559 Vitamin D deficiency, unspecified: Secondary | ICD-10-CM | POA: Diagnosis not present

## 2023-08-15 DIAGNOSIS — I959 Hypotension, unspecified: Secondary | ICD-10-CM | POA: Diagnosis not present

## 2023-08-15 DIAGNOSIS — Z96643 Presence of artificial hip joint, bilateral: Secondary | ICD-10-CM | POA: Diagnosis not present

## 2023-08-15 DIAGNOSIS — N1832 Chronic kidney disease, stage 3b: Secondary | ICD-10-CM | POA: Diagnosis not present

## 2023-08-15 DIAGNOSIS — N139 Obstructive and reflux uropathy, unspecified: Secondary | ICD-10-CM | POA: Diagnosis not present

## 2023-08-15 DIAGNOSIS — Z993 Dependence on wheelchair: Secondary | ICD-10-CM | POA: Diagnosis not present

## 2023-08-15 DIAGNOSIS — Z8616 Personal history of COVID-19: Secondary | ICD-10-CM | POA: Diagnosis not present

## 2023-08-15 DIAGNOSIS — E538 Deficiency of other specified B group vitamins: Secondary | ICD-10-CM | POA: Diagnosis not present

## 2023-08-15 DIAGNOSIS — Z87442 Personal history of urinary calculi: Secondary | ICD-10-CM | POA: Diagnosis not present

## 2023-08-15 DIAGNOSIS — Z8541 Personal history of malignant neoplasm of cervix uteri: Secondary | ICD-10-CM | POA: Diagnosis not present

## 2023-08-15 DIAGNOSIS — Z96651 Presence of right artificial knee joint: Secondary | ICD-10-CM | POA: Diagnosis not present

## 2023-08-20 DIAGNOSIS — E538 Deficiency of other specified B group vitamins: Secondary | ICD-10-CM | POA: Diagnosis not present

## 2023-08-20 DIAGNOSIS — D631 Anemia in chronic kidney disease: Secondary | ICD-10-CM | POA: Diagnosis not present

## 2023-08-20 DIAGNOSIS — Z96643 Presence of artificial hip joint, bilateral: Secondary | ICD-10-CM | POA: Diagnosis not present

## 2023-08-20 DIAGNOSIS — Z96651 Presence of right artificial knee joint: Secondary | ICD-10-CM | POA: Diagnosis not present

## 2023-08-20 DIAGNOSIS — M069 Rheumatoid arthritis, unspecified: Secondary | ICD-10-CM | POA: Diagnosis not present

## 2023-08-20 DIAGNOSIS — I130 Hypertensive heart and chronic kidney disease with heart failure and stage 1 through stage 4 chronic kidney disease, or unspecified chronic kidney disease: Secondary | ICD-10-CM | POA: Diagnosis not present

## 2023-08-20 DIAGNOSIS — Z993 Dependence on wheelchair: Secondary | ICD-10-CM | POA: Diagnosis not present

## 2023-08-20 DIAGNOSIS — N1832 Chronic kidney disease, stage 3b: Secondary | ICD-10-CM | POA: Diagnosis not present

## 2023-08-20 DIAGNOSIS — I959 Hypotension, unspecified: Secondary | ICD-10-CM | POA: Diagnosis not present

## 2023-08-20 DIAGNOSIS — N139 Obstructive and reflux uropathy, unspecified: Secondary | ICD-10-CM | POA: Diagnosis not present

## 2023-08-20 DIAGNOSIS — Z87440 Personal history of urinary (tract) infections: Secondary | ICD-10-CM | POA: Diagnosis not present

## 2023-08-20 DIAGNOSIS — Z8541 Personal history of malignant neoplasm of cervix uteri: Secondary | ICD-10-CM | POA: Diagnosis not present

## 2023-08-20 DIAGNOSIS — E559 Vitamin D deficiency, unspecified: Secondary | ICD-10-CM | POA: Diagnosis not present

## 2023-08-20 DIAGNOSIS — Z792 Long term (current) use of antibiotics: Secondary | ICD-10-CM | POA: Diagnosis not present

## 2023-08-20 DIAGNOSIS — Z8616 Personal history of COVID-19: Secondary | ICD-10-CM | POA: Diagnosis not present

## 2023-08-20 DIAGNOSIS — Z87442 Personal history of urinary calculi: Secondary | ICD-10-CM | POA: Diagnosis not present

## 2023-08-20 DIAGNOSIS — I5030 Unspecified diastolic (congestive) heart failure: Secondary | ICD-10-CM | POA: Diagnosis not present

## 2023-08-20 DIAGNOSIS — A0472 Enterocolitis due to Clostridium difficile, not specified as recurrent: Secondary | ICD-10-CM | POA: Diagnosis not present

## 2023-08-26 DIAGNOSIS — M05772 Rheumatoid arthritis with rheumatoid factor of left ankle and foot without organ or systems involvement: Secondary | ICD-10-CM | POA: Diagnosis not present

## 2023-08-26 DIAGNOSIS — M05771 Rheumatoid arthritis with rheumatoid factor of right ankle and foot without organ or systems involvement: Secondary | ICD-10-CM | POA: Diagnosis not present

## 2023-08-26 DIAGNOSIS — M85871 Other specified disorders of bone density and structure, right ankle and foot: Secondary | ICD-10-CM | POA: Diagnosis not present

## 2023-08-26 DIAGNOSIS — M19071 Primary osteoarthritis, right ankle and foot: Secondary | ICD-10-CM | POA: Diagnosis not present

## 2023-08-26 DIAGNOSIS — M85872 Other specified disorders of bone density and structure, left ankle and foot: Secondary | ICD-10-CM | POA: Diagnosis not present

## 2023-08-26 DIAGNOSIS — Z96643 Presence of artificial hip joint, bilateral: Secondary | ICD-10-CM | POA: Diagnosis not present

## 2023-08-26 DIAGNOSIS — M25571 Pain in right ankle and joints of right foot: Secondary | ICD-10-CM | POA: Diagnosis not present

## 2023-08-26 DIAGNOSIS — M24571 Contracture, right ankle: Secondary | ICD-10-CM | POA: Diagnosis not present

## 2023-08-26 DIAGNOSIS — G8929 Other chronic pain: Secondary | ICD-10-CM | POA: Diagnosis not present

## 2023-08-26 DIAGNOSIS — M19072 Primary osteoarthritis, left ankle and foot: Secondary | ICD-10-CM | POA: Diagnosis not present

## 2023-08-26 DIAGNOSIS — Z96659 Presence of unspecified artificial knee joint: Secondary | ICD-10-CM | POA: Diagnosis not present

## 2023-08-27 DIAGNOSIS — Z8616 Personal history of COVID-19: Secondary | ICD-10-CM | POA: Diagnosis not present

## 2023-08-27 DIAGNOSIS — Z87440 Personal history of urinary (tract) infections: Secondary | ICD-10-CM | POA: Diagnosis not present

## 2023-08-27 DIAGNOSIS — E538 Deficiency of other specified B group vitamins: Secondary | ICD-10-CM | POA: Diagnosis not present

## 2023-08-27 DIAGNOSIS — Z792 Long term (current) use of antibiotics: Secondary | ICD-10-CM | POA: Diagnosis not present

## 2023-08-27 DIAGNOSIS — N1832 Chronic kidney disease, stage 3b: Secondary | ICD-10-CM | POA: Diagnosis not present

## 2023-08-27 DIAGNOSIS — I130 Hypertensive heart and chronic kidney disease with heart failure and stage 1 through stage 4 chronic kidney disease, or unspecified chronic kidney disease: Secondary | ICD-10-CM | POA: Diagnosis not present

## 2023-08-27 DIAGNOSIS — N139 Obstructive and reflux uropathy, unspecified: Secondary | ICD-10-CM | POA: Diagnosis not present

## 2023-08-27 DIAGNOSIS — I5030 Unspecified diastolic (congestive) heart failure: Secondary | ICD-10-CM | POA: Diagnosis not present

## 2023-08-27 DIAGNOSIS — D631 Anemia in chronic kidney disease: Secondary | ICD-10-CM | POA: Diagnosis not present

## 2023-08-27 DIAGNOSIS — Z993 Dependence on wheelchair: Secondary | ICD-10-CM | POA: Diagnosis not present

## 2023-08-27 DIAGNOSIS — A0472 Enterocolitis due to Clostridium difficile, not specified as recurrent: Secondary | ICD-10-CM | POA: Diagnosis not present

## 2023-08-27 DIAGNOSIS — Z8541 Personal history of malignant neoplasm of cervix uteri: Secondary | ICD-10-CM | POA: Diagnosis not present

## 2023-08-27 DIAGNOSIS — Z96643 Presence of artificial hip joint, bilateral: Secondary | ICD-10-CM | POA: Diagnosis not present

## 2023-08-27 DIAGNOSIS — M069 Rheumatoid arthritis, unspecified: Secondary | ICD-10-CM | POA: Diagnosis not present

## 2023-08-27 DIAGNOSIS — E559 Vitamin D deficiency, unspecified: Secondary | ICD-10-CM | POA: Diagnosis not present

## 2023-08-27 DIAGNOSIS — Z96651 Presence of right artificial knee joint: Secondary | ICD-10-CM | POA: Diagnosis not present

## 2023-08-27 DIAGNOSIS — Z87442 Personal history of urinary calculi: Secondary | ICD-10-CM | POA: Diagnosis not present

## 2023-08-27 DIAGNOSIS — I959 Hypotension, unspecified: Secondary | ICD-10-CM | POA: Diagnosis not present

## 2023-08-29 DIAGNOSIS — I959 Hypotension, unspecified: Secondary | ICD-10-CM | POA: Diagnosis not present

## 2023-08-29 DIAGNOSIS — Z87440 Personal history of urinary (tract) infections: Secondary | ICD-10-CM | POA: Diagnosis not present

## 2023-08-29 DIAGNOSIS — N1832 Chronic kidney disease, stage 3b: Secondary | ICD-10-CM | POA: Diagnosis not present

## 2023-08-29 DIAGNOSIS — Z96643 Presence of artificial hip joint, bilateral: Secondary | ICD-10-CM | POA: Diagnosis not present

## 2023-08-29 DIAGNOSIS — I5030 Unspecified diastolic (congestive) heart failure: Secondary | ICD-10-CM | POA: Diagnosis not present

## 2023-08-29 DIAGNOSIS — E559 Vitamin D deficiency, unspecified: Secondary | ICD-10-CM | POA: Diagnosis not present

## 2023-08-29 DIAGNOSIS — Z8541 Personal history of malignant neoplasm of cervix uteri: Secondary | ICD-10-CM | POA: Diagnosis not present

## 2023-08-29 DIAGNOSIS — Z87442 Personal history of urinary calculi: Secondary | ICD-10-CM | POA: Diagnosis not present

## 2023-08-29 DIAGNOSIS — E538 Deficiency of other specified B group vitamins: Secondary | ICD-10-CM | POA: Diagnosis not present

## 2023-08-29 DIAGNOSIS — N139 Obstructive and reflux uropathy, unspecified: Secondary | ICD-10-CM | POA: Diagnosis not present

## 2023-08-29 DIAGNOSIS — Z993 Dependence on wheelchair: Secondary | ICD-10-CM | POA: Diagnosis not present

## 2023-08-29 DIAGNOSIS — Z792 Long term (current) use of antibiotics: Secondary | ICD-10-CM | POA: Diagnosis not present

## 2023-08-29 DIAGNOSIS — I130 Hypertensive heart and chronic kidney disease with heart failure and stage 1 through stage 4 chronic kidney disease, or unspecified chronic kidney disease: Secondary | ICD-10-CM | POA: Diagnosis not present

## 2023-08-29 DIAGNOSIS — A0472 Enterocolitis due to Clostridium difficile, not specified as recurrent: Secondary | ICD-10-CM | POA: Diagnosis not present

## 2023-08-29 DIAGNOSIS — Z96651 Presence of right artificial knee joint: Secondary | ICD-10-CM | POA: Diagnosis not present

## 2023-08-29 DIAGNOSIS — M069 Rheumatoid arthritis, unspecified: Secondary | ICD-10-CM | POA: Diagnosis not present

## 2023-08-29 DIAGNOSIS — Z8616 Personal history of COVID-19: Secondary | ICD-10-CM | POA: Diagnosis not present

## 2023-08-29 DIAGNOSIS — D631 Anemia in chronic kidney disease: Secondary | ICD-10-CM | POA: Diagnosis not present

## 2023-09-06 DIAGNOSIS — I5030 Unspecified diastolic (congestive) heart failure: Secondary | ICD-10-CM | POA: Diagnosis not present

## 2023-09-06 DIAGNOSIS — D631 Anemia in chronic kidney disease: Secondary | ICD-10-CM | POA: Diagnosis not present

## 2023-09-06 DIAGNOSIS — E559 Vitamin D deficiency, unspecified: Secondary | ICD-10-CM | POA: Diagnosis not present

## 2023-09-06 DIAGNOSIS — Z8616 Personal history of COVID-19: Secondary | ICD-10-CM | POA: Diagnosis not present

## 2023-09-06 DIAGNOSIS — E538 Deficiency of other specified B group vitamins: Secondary | ICD-10-CM | POA: Diagnosis not present

## 2023-09-06 DIAGNOSIS — Z87442 Personal history of urinary calculi: Secondary | ICD-10-CM | POA: Diagnosis not present

## 2023-09-06 DIAGNOSIS — Z993 Dependence on wheelchair: Secondary | ICD-10-CM | POA: Diagnosis not present

## 2023-09-06 DIAGNOSIS — Z96643 Presence of artificial hip joint, bilateral: Secondary | ICD-10-CM | POA: Diagnosis not present

## 2023-09-06 DIAGNOSIS — I130 Hypertensive heart and chronic kidney disease with heart failure and stage 1 through stage 4 chronic kidney disease, or unspecified chronic kidney disease: Secondary | ICD-10-CM | POA: Diagnosis not present

## 2023-09-06 DIAGNOSIS — Z87440 Personal history of urinary (tract) infections: Secondary | ICD-10-CM | POA: Diagnosis not present

## 2023-09-06 DIAGNOSIS — N139 Obstructive and reflux uropathy, unspecified: Secondary | ICD-10-CM | POA: Diagnosis not present

## 2023-09-06 DIAGNOSIS — N1832 Chronic kidney disease, stage 3b: Secondary | ICD-10-CM | POA: Diagnosis not present

## 2023-09-06 DIAGNOSIS — Z96651 Presence of right artificial knee joint: Secondary | ICD-10-CM | POA: Diagnosis not present

## 2023-09-06 DIAGNOSIS — A0472 Enterocolitis due to Clostridium difficile, not specified as recurrent: Secondary | ICD-10-CM | POA: Diagnosis not present

## 2023-09-06 DIAGNOSIS — I959 Hypotension, unspecified: Secondary | ICD-10-CM | POA: Diagnosis not present

## 2023-09-06 DIAGNOSIS — Z792 Long term (current) use of antibiotics: Secondary | ICD-10-CM | POA: Diagnosis not present

## 2023-09-06 DIAGNOSIS — Z8541 Personal history of malignant neoplasm of cervix uteri: Secondary | ICD-10-CM | POA: Diagnosis not present

## 2023-09-06 DIAGNOSIS — M069 Rheumatoid arthritis, unspecified: Secondary | ICD-10-CM | POA: Diagnosis not present

## 2023-09-10 DIAGNOSIS — I5030 Unspecified diastolic (congestive) heart failure: Secondary | ICD-10-CM | POA: Diagnosis not present

## 2023-09-10 DIAGNOSIS — M069 Rheumatoid arthritis, unspecified: Secondary | ICD-10-CM | POA: Diagnosis not present

## 2023-09-10 DIAGNOSIS — Z8541 Personal history of malignant neoplasm of cervix uteri: Secondary | ICD-10-CM | POA: Diagnosis not present

## 2023-09-10 DIAGNOSIS — E559 Vitamin D deficiency, unspecified: Secondary | ICD-10-CM | POA: Diagnosis not present

## 2023-09-10 DIAGNOSIS — I959 Hypotension, unspecified: Secondary | ICD-10-CM | POA: Diagnosis not present

## 2023-09-10 DIAGNOSIS — Z792 Long term (current) use of antibiotics: Secondary | ICD-10-CM | POA: Diagnosis not present

## 2023-09-10 DIAGNOSIS — I130 Hypertensive heart and chronic kidney disease with heart failure and stage 1 through stage 4 chronic kidney disease, or unspecified chronic kidney disease: Secondary | ICD-10-CM | POA: Diagnosis not present

## 2023-09-10 DIAGNOSIS — N139 Obstructive and reflux uropathy, unspecified: Secondary | ICD-10-CM | POA: Diagnosis not present

## 2023-09-10 DIAGNOSIS — D631 Anemia in chronic kidney disease: Secondary | ICD-10-CM | POA: Diagnosis not present

## 2023-09-10 DIAGNOSIS — Z87440 Personal history of urinary (tract) infections: Secondary | ICD-10-CM | POA: Diagnosis not present

## 2023-09-10 DIAGNOSIS — E538 Deficiency of other specified B group vitamins: Secondary | ICD-10-CM | POA: Diagnosis not present

## 2023-09-10 DIAGNOSIS — A0472 Enterocolitis due to Clostridium difficile, not specified as recurrent: Secondary | ICD-10-CM | POA: Diagnosis not present

## 2023-09-10 DIAGNOSIS — Z96651 Presence of right artificial knee joint: Secondary | ICD-10-CM | POA: Diagnosis not present

## 2023-09-10 DIAGNOSIS — N1832 Chronic kidney disease, stage 3b: Secondary | ICD-10-CM | POA: Diagnosis not present

## 2023-09-10 DIAGNOSIS — Z8616 Personal history of COVID-19: Secondary | ICD-10-CM | POA: Diagnosis not present

## 2023-09-10 DIAGNOSIS — Z87442 Personal history of urinary calculi: Secondary | ICD-10-CM | POA: Diagnosis not present

## 2023-09-10 DIAGNOSIS — Z96643 Presence of artificial hip joint, bilateral: Secondary | ICD-10-CM | POA: Diagnosis not present

## 2023-09-10 DIAGNOSIS — Z993 Dependence on wheelchair: Secondary | ICD-10-CM | POA: Diagnosis not present

## 2023-09-12 DIAGNOSIS — N1832 Chronic kidney disease, stage 3b: Secondary | ICD-10-CM | POA: Diagnosis not present

## 2023-09-12 DIAGNOSIS — I959 Hypotension, unspecified: Secondary | ICD-10-CM | POA: Diagnosis not present

## 2023-09-12 DIAGNOSIS — Z8616 Personal history of COVID-19: Secondary | ICD-10-CM | POA: Diagnosis not present

## 2023-09-12 DIAGNOSIS — Z993 Dependence on wheelchair: Secondary | ICD-10-CM | POA: Diagnosis not present

## 2023-09-12 DIAGNOSIS — I130 Hypertensive heart and chronic kidney disease with heart failure and stage 1 through stage 4 chronic kidney disease, or unspecified chronic kidney disease: Secondary | ICD-10-CM | POA: Diagnosis not present

## 2023-09-12 DIAGNOSIS — Z87442 Personal history of urinary calculi: Secondary | ICD-10-CM | POA: Diagnosis not present

## 2023-09-12 DIAGNOSIS — Z8541 Personal history of malignant neoplasm of cervix uteri: Secondary | ICD-10-CM | POA: Diagnosis not present

## 2023-09-12 DIAGNOSIS — A0472 Enterocolitis due to Clostridium difficile, not specified as recurrent: Secondary | ICD-10-CM | POA: Diagnosis not present

## 2023-09-12 DIAGNOSIS — Z792 Long term (current) use of antibiotics: Secondary | ICD-10-CM | POA: Diagnosis not present

## 2023-09-12 DIAGNOSIS — N139 Obstructive and reflux uropathy, unspecified: Secondary | ICD-10-CM | POA: Diagnosis not present

## 2023-09-12 DIAGNOSIS — E559 Vitamin D deficiency, unspecified: Secondary | ICD-10-CM | POA: Diagnosis not present

## 2023-09-12 DIAGNOSIS — I5030 Unspecified diastolic (congestive) heart failure: Secondary | ICD-10-CM | POA: Diagnosis not present

## 2023-09-12 DIAGNOSIS — Z96643 Presence of artificial hip joint, bilateral: Secondary | ICD-10-CM | POA: Diagnosis not present

## 2023-09-12 DIAGNOSIS — Z87440 Personal history of urinary (tract) infections: Secondary | ICD-10-CM | POA: Diagnosis not present

## 2023-09-12 DIAGNOSIS — M069 Rheumatoid arthritis, unspecified: Secondary | ICD-10-CM | POA: Diagnosis not present

## 2023-09-12 DIAGNOSIS — Z96651 Presence of right artificial knee joint: Secondary | ICD-10-CM | POA: Diagnosis not present

## 2023-09-12 DIAGNOSIS — E538 Deficiency of other specified B group vitamins: Secondary | ICD-10-CM | POA: Diagnosis not present

## 2023-09-12 DIAGNOSIS — D631 Anemia in chronic kidney disease: Secondary | ICD-10-CM | POA: Diagnosis not present

## 2023-09-17 DIAGNOSIS — Z96643 Presence of artificial hip joint, bilateral: Secondary | ICD-10-CM | POA: Diagnosis not present

## 2023-09-17 DIAGNOSIS — Z87440 Personal history of urinary (tract) infections: Secondary | ICD-10-CM | POA: Diagnosis not present

## 2023-09-17 DIAGNOSIS — E559 Vitamin D deficiency, unspecified: Secondary | ICD-10-CM | POA: Diagnosis not present

## 2023-09-17 DIAGNOSIS — D631 Anemia in chronic kidney disease: Secondary | ICD-10-CM | POA: Diagnosis not present

## 2023-09-17 DIAGNOSIS — Z792 Long term (current) use of antibiotics: Secondary | ICD-10-CM | POA: Diagnosis not present

## 2023-09-17 DIAGNOSIS — I130 Hypertensive heart and chronic kidney disease with heart failure and stage 1 through stage 4 chronic kidney disease, or unspecified chronic kidney disease: Secondary | ICD-10-CM | POA: Diagnosis not present

## 2023-09-17 DIAGNOSIS — I959 Hypotension, unspecified: Secondary | ICD-10-CM | POA: Diagnosis not present

## 2023-09-17 DIAGNOSIS — N139 Obstructive and reflux uropathy, unspecified: Secondary | ICD-10-CM | POA: Diagnosis not present

## 2023-09-17 DIAGNOSIS — Z8541 Personal history of malignant neoplasm of cervix uteri: Secondary | ICD-10-CM | POA: Diagnosis not present

## 2023-09-17 DIAGNOSIS — Z8616 Personal history of COVID-19: Secondary | ICD-10-CM | POA: Diagnosis not present

## 2023-09-17 DIAGNOSIS — Z87442 Personal history of urinary calculi: Secondary | ICD-10-CM | POA: Diagnosis not present

## 2023-09-17 DIAGNOSIS — A0472 Enterocolitis due to Clostridium difficile, not specified as recurrent: Secondary | ICD-10-CM | POA: Diagnosis not present

## 2023-09-17 DIAGNOSIS — Z96651 Presence of right artificial knee joint: Secondary | ICD-10-CM | POA: Diagnosis not present

## 2023-09-17 DIAGNOSIS — E538 Deficiency of other specified B group vitamins: Secondary | ICD-10-CM | POA: Diagnosis not present

## 2023-09-17 DIAGNOSIS — Z993 Dependence on wheelchair: Secondary | ICD-10-CM | POA: Diagnosis not present

## 2023-09-17 DIAGNOSIS — M069 Rheumatoid arthritis, unspecified: Secondary | ICD-10-CM | POA: Diagnosis not present

## 2023-09-17 DIAGNOSIS — N1832 Chronic kidney disease, stage 3b: Secondary | ICD-10-CM | POA: Diagnosis not present

## 2023-09-17 DIAGNOSIS — I5030 Unspecified diastolic (congestive) heart failure: Secondary | ICD-10-CM | POA: Diagnosis not present

## 2023-09-24 DIAGNOSIS — M069 Rheumatoid arthritis, unspecified: Secondary | ICD-10-CM | POA: Diagnosis not present

## 2023-09-24 DIAGNOSIS — Z792 Long term (current) use of antibiotics: Secondary | ICD-10-CM | POA: Diagnosis not present

## 2023-09-24 DIAGNOSIS — N1832 Chronic kidney disease, stage 3b: Secondary | ICD-10-CM | POA: Diagnosis not present

## 2023-09-24 DIAGNOSIS — Z87442 Personal history of urinary calculi: Secondary | ICD-10-CM | POA: Diagnosis not present

## 2023-09-24 DIAGNOSIS — N139 Obstructive and reflux uropathy, unspecified: Secondary | ICD-10-CM | POA: Diagnosis not present

## 2023-09-24 DIAGNOSIS — I130 Hypertensive heart and chronic kidney disease with heart failure and stage 1 through stage 4 chronic kidney disease, or unspecified chronic kidney disease: Secondary | ICD-10-CM | POA: Diagnosis not present

## 2023-09-24 DIAGNOSIS — I5030 Unspecified diastolic (congestive) heart failure: Secondary | ICD-10-CM | POA: Diagnosis not present

## 2023-09-24 DIAGNOSIS — E538 Deficiency of other specified B group vitamins: Secondary | ICD-10-CM | POA: Diagnosis not present

## 2023-09-24 DIAGNOSIS — Z8541 Personal history of malignant neoplasm of cervix uteri: Secondary | ICD-10-CM | POA: Diagnosis not present

## 2023-09-24 DIAGNOSIS — Z993 Dependence on wheelchair: Secondary | ICD-10-CM | POA: Diagnosis not present

## 2023-09-24 DIAGNOSIS — Z87440 Personal history of urinary (tract) infections: Secondary | ICD-10-CM | POA: Diagnosis not present

## 2023-09-24 DIAGNOSIS — E559 Vitamin D deficiency, unspecified: Secondary | ICD-10-CM | POA: Diagnosis not present

## 2023-09-24 DIAGNOSIS — Z96643 Presence of artificial hip joint, bilateral: Secondary | ICD-10-CM | POA: Diagnosis not present

## 2023-09-24 DIAGNOSIS — Z8616 Personal history of COVID-19: Secondary | ICD-10-CM | POA: Diagnosis not present

## 2023-09-24 DIAGNOSIS — I959 Hypotension, unspecified: Secondary | ICD-10-CM | POA: Diagnosis not present

## 2023-09-24 DIAGNOSIS — D631 Anemia in chronic kidney disease: Secondary | ICD-10-CM | POA: Diagnosis not present

## 2023-09-24 DIAGNOSIS — A0472 Enterocolitis due to Clostridium difficile, not specified as recurrent: Secondary | ICD-10-CM | POA: Diagnosis not present

## 2023-09-24 DIAGNOSIS — Z96651 Presence of right artificial knee joint: Secondary | ICD-10-CM | POA: Diagnosis not present

## 2023-09-26 DIAGNOSIS — R8290 Unspecified abnormal findings in urine: Secondary | ICD-10-CM | POA: Diagnosis not present

## 2023-10-01 DIAGNOSIS — Z96651 Presence of right artificial knee joint: Secondary | ICD-10-CM | POA: Diagnosis not present

## 2023-10-01 DIAGNOSIS — Z87440 Personal history of urinary (tract) infections: Secondary | ICD-10-CM | POA: Diagnosis not present

## 2023-10-01 DIAGNOSIS — D631 Anemia in chronic kidney disease: Secondary | ICD-10-CM | POA: Diagnosis not present

## 2023-10-01 DIAGNOSIS — N139 Obstructive and reflux uropathy, unspecified: Secondary | ICD-10-CM | POA: Diagnosis not present

## 2023-10-01 DIAGNOSIS — Z96643 Presence of artificial hip joint, bilateral: Secondary | ICD-10-CM | POA: Diagnosis not present

## 2023-10-01 DIAGNOSIS — Z8541 Personal history of malignant neoplasm of cervix uteri: Secondary | ICD-10-CM | POA: Diagnosis not present

## 2023-10-01 DIAGNOSIS — I5030 Unspecified diastolic (congestive) heart failure: Secondary | ICD-10-CM | POA: Diagnosis not present

## 2023-10-01 DIAGNOSIS — E559 Vitamin D deficiency, unspecified: Secondary | ICD-10-CM | POA: Diagnosis not present

## 2023-10-01 DIAGNOSIS — Z87442 Personal history of urinary calculi: Secondary | ICD-10-CM | POA: Diagnosis not present

## 2023-10-01 DIAGNOSIS — Z993 Dependence on wheelchair: Secondary | ICD-10-CM | POA: Diagnosis not present

## 2023-10-01 DIAGNOSIS — M069 Rheumatoid arthritis, unspecified: Secondary | ICD-10-CM | POA: Diagnosis not present

## 2023-10-01 DIAGNOSIS — I959 Hypotension, unspecified: Secondary | ICD-10-CM | POA: Diagnosis not present

## 2023-10-01 DIAGNOSIS — Z792 Long term (current) use of antibiotics: Secondary | ICD-10-CM | POA: Diagnosis not present

## 2023-10-01 DIAGNOSIS — A0472 Enterocolitis due to Clostridium difficile, not specified as recurrent: Secondary | ICD-10-CM | POA: Diagnosis not present

## 2023-10-01 DIAGNOSIS — N1832 Chronic kidney disease, stage 3b: Secondary | ICD-10-CM | POA: Diagnosis not present

## 2023-10-01 DIAGNOSIS — I130 Hypertensive heart and chronic kidney disease with heart failure and stage 1 through stage 4 chronic kidney disease, or unspecified chronic kidney disease: Secondary | ICD-10-CM | POA: Diagnosis not present

## 2023-10-01 DIAGNOSIS — E538 Deficiency of other specified B group vitamins: Secondary | ICD-10-CM | POA: Diagnosis not present

## 2023-10-01 DIAGNOSIS — Z8616 Personal history of COVID-19: Secondary | ICD-10-CM | POA: Diagnosis not present

## 2023-10-07 DIAGNOSIS — I129 Hypertensive chronic kidney disease with stage 1 through stage 4 chronic kidney disease, or unspecified chronic kidney disease: Secondary | ICD-10-CM | POA: Diagnosis not present

## 2023-10-07 DIAGNOSIS — N184 Chronic kidney disease, stage 4 (severe): Secondary | ICD-10-CM | POA: Diagnosis not present

## 2023-10-07 DIAGNOSIS — Z8541 Personal history of malignant neoplasm of cervix uteri: Secondary | ICD-10-CM | POA: Diagnosis not present

## 2023-10-07 DIAGNOSIS — N132 Hydronephrosis with renal and ureteral calculous obstruction: Secondary | ICD-10-CM | POA: Diagnosis not present

## 2023-10-07 DIAGNOSIS — R339 Retention of urine, unspecified: Secondary | ICD-10-CM | POA: Diagnosis not present

## 2023-10-07 DIAGNOSIS — N134 Hydroureter: Secondary | ICD-10-CM | POA: Diagnosis not present

## 2023-10-08 DIAGNOSIS — R339 Retention of urine, unspecified: Secondary | ICD-10-CM | POA: Diagnosis not present

## 2023-10-08 DIAGNOSIS — N132 Hydronephrosis with renal and ureteral calculous obstruction: Secondary | ICD-10-CM | POA: Diagnosis not present

## 2023-10-08 DIAGNOSIS — N134 Hydroureter: Secondary | ICD-10-CM | POA: Diagnosis not present

## 2023-10-09 DIAGNOSIS — R339 Retention of urine, unspecified: Secondary | ICD-10-CM | POA: Diagnosis not present

## 2023-10-15 DIAGNOSIS — Z96651 Presence of right artificial knee joint: Secondary | ICD-10-CM | POA: Diagnosis not present

## 2023-10-15 DIAGNOSIS — I959 Hypotension, unspecified: Secondary | ICD-10-CM | POA: Diagnosis not present

## 2023-10-15 DIAGNOSIS — E538 Deficiency of other specified B group vitamins: Secondary | ICD-10-CM | POA: Diagnosis not present

## 2023-10-15 DIAGNOSIS — A0472 Enterocolitis due to Clostridium difficile, not specified as recurrent: Secondary | ICD-10-CM | POA: Diagnosis not present

## 2023-10-15 DIAGNOSIS — D631 Anemia in chronic kidney disease: Secondary | ICD-10-CM | POA: Diagnosis not present

## 2023-10-15 DIAGNOSIS — Z96643 Presence of artificial hip joint, bilateral: Secondary | ICD-10-CM | POA: Diagnosis not present

## 2023-10-15 DIAGNOSIS — Z792 Long term (current) use of antibiotics: Secondary | ICD-10-CM | POA: Diagnosis not present

## 2023-10-15 DIAGNOSIS — M069 Rheumatoid arthritis, unspecified: Secondary | ICD-10-CM | POA: Diagnosis not present

## 2023-10-15 DIAGNOSIS — N139 Obstructive and reflux uropathy, unspecified: Secondary | ICD-10-CM | POA: Diagnosis not present

## 2023-10-15 DIAGNOSIS — I5030 Unspecified diastolic (congestive) heart failure: Secondary | ICD-10-CM | POA: Diagnosis not present

## 2023-10-15 DIAGNOSIS — E559 Vitamin D deficiency, unspecified: Secondary | ICD-10-CM | POA: Diagnosis not present

## 2023-10-15 DIAGNOSIS — Z87442 Personal history of urinary calculi: Secondary | ICD-10-CM | POA: Diagnosis not present

## 2023-10-15 DIAGNOSIS — Z8541 Personal history of malignant neoplasm of cervix uteri: Secondary | ICD-10-CM | POA: Diagnosis not present

## 2023-10-15 DIAGNOSIS — Z8616 Personal history of COVID-19: Secondary | ICD-10-CM | POA: Diagnosis not present

## 2023-10-15 DIAGNOSIS — N1832 Chronic kidney disease, stage 3b: Secondary | ICD-10-CM | POA: Diagnosis not present

## 2023-10-15 DIAGNOSIS — I130 Hypertensive heart and chronic kidney disease with heart failure and stage 1 through stage 4 chronic kidney disease, or unspecified chronic kidney disease: Secondary | ICD-10-CM | POA: Diagnosis not present

## 2023-10-15 DIAGNOSIS — Z87440 Personal history of urinary (tract) infections: Secondary | ICD-10-CM | POA: Diagnosis not present

## 2023-10-15 DIAGNOSIS — Z993 Dependence on wheelchair: Secondary | ICD-10-CM | POA: Diagnosis not present

## 2023-10-18 DIAGNOSIS — Z792 Long term (current) use of antibiotics: Secondary | ICD-10-CM | POA: Diagnosis not present

## 2023-10-18 DIAGNOSIS — Z8616 Personal history of COVID-19: Secondary | ICD-10-CM | POA: Diagnosis not present

## 2023-10-18 DIAGNOSIS — Z993 Dependence on wheelchair: Secondary | ICD-10-CM | POA: Diagnosis not present

## 2023-10-18 DIAGNOSIS — I959 Hypotension, unspecified: Secondary | ICD-10-CM | POA: Diagnosis not present

## 2023-10-18 DIAGNOSIS — Z8541 Personal history of malignant neoplasm of cervix uteri: Secondary | ICD-10-CM | POA: Diagnosis not present

## 2023-10-18 DIAGNOSIS — N139 Obstructive and reflux uropathy, unspecified: Secondary | ICD-10-CM | POA: Diagnosis not present

## 2023-10-18 DIAGNOSIS — I5030 Unspecified diastolic (congestive) heart failure: Secondary | ICD-10-CM | POA: Diagnosis not present

## 2023-10-18 DIAGNOSIS — E538 Deficiency of other specified B group vitamins: Secondary | ICD-10-CM | POA: Diagnosis not present

## 2023-10-18 DIAGNOSIS — D631 Anemia in chronic kidney disease: Secondary | ICD-10-CM | POA: Diagnosis not present

## 2023-10-18 DIAGNOSIS — Z87440 Personal history of urinary (tract) infections: Secondary | ICD-10-CM | POA: Diagnosis not present

## 2023-10-18 DIAGNOSIS — Z96643 Presence of artificial hip joint, bilateral: Secondary | ICD-10-CM | POA: Diagnosis not present

## 2023-10-18 DIAGNOSIS — Z96651 Presence of right artificial knee joint: Secondary | ICD-10-CM | POA: Diagnosis not present

## 2023-10-18 DIAGNOSIS — E559 Vitamin D deficiency, unspecified: Secondary | ICD-10-CM | POA: Diagnosis not present

## 2023-10-18 DIAGNOSIS — Z87442 Personal history of urinary calculi: Secondary | ICD-10-CM | POA: Diagnosis not present

## 2023-10-18 DIAGNOSIS — A0472 Enterocolitis due to Clostridium difficile, not specified as recurrent: Secondary | ICD-10-CM | POA: Diagnosis not present

## 2023-10-18 DIAGNOSIS — M069 Rheumatoid arthritis, unspecified: Secondary | ICD-10-CM | POA: Diagnosis not present

## 2023-10-18 DIAGNOSIS — I130 Hypertensive heart and chronic kidney disease with heart failure and stage 1 through stage 4 chronic kidney disease, or unspecified chronic kidney disease: Secondary | ICD-10-CM | POA: Diagnosis not present

## 2023-10-18 DIAGNOSIS — N1832 Chronic kidney disease, stage 3b: Secondary | ICD-10-CM | POA: Diagnosis not present

## 2023-10-29 DIAGNOSIS — I959 Hypotension, unspecified: Secondary | ICD-10-CM | POA: Diagnosis not present

## 2023-10-29 DIAGNOSIS — A0472 Enterocolitis due to Clostridium difficile, not specified as recurrent: Secondary | ICD-10-CM | POA: Diagnosis not present

## 2023-10-29 DIAGNOSIS — M069 Rheumatoid arthritis, unspecified: Secondary | ICD-10-CM | POA: Diagnosis not present

## 2023-10-29 DIAGNOSIS — I5030 Unspecified diastolic (congestive) heart failure: Secondary | ICD-10-CM | POA: Diagnosis not present

## 2023-10-29 DIAGNOSIS — E538 Deficiency of other specified B group vitamins: Secondary | ICD-10-CM | POA: Diagnosis not present

## 2023-10-29 DIAGNOSIS — Z792 Long term (current) use of antibiotics: Secondary | ICD-10-CM | POA: Diagnosis not present

## 2023-10-29 DIAGNOSIS — Z87442 Personal history of urinary calculi: Secondary | ICD-10-CM | POA: Diagnosis not present

## 2023-10-29 DIAGNOSIS — Z87440 Personal history of urinary (tract) infections: Secondary | ICD-10-CM | POA: Diagnosis not present

## 2023-10-29 DIAGNOSIS — E559 Vitamin D deficiency, unspecified: Secondary | ICD-10-CM | POA: Diagnosis not present

## 2023-10-29 DIAGNOSIS — I130 Hypertensive heart and chronic kidney disease with heart failure and stage 1 through stage 4 chronic kidney disease, or unspecified chronic kidney disease: Secondary | ICD-10-CM | POA: Diagnosis not present

## 2023-10-29 DIAGNOSIS — Z96651 Presence of right artificial knee joint: Secondary | ICD-10-CM | POA: Diagnosis not present

## 2023-10-29 DIAGNOSIS — N139 Obstructive and reflux uropathy, unspecified: Secondary | ICD-10-CM | POA: Diagnosis not present

## 2023-10-29 DIAGNOSIS — Z993 Dependence on wheelchair: Secondary | ICD-10-CM | POA: Diagnosis not present

## 2023-10-29 DIAGNOSIS — Z8616 Personal history of COVID-19: Secondary | ICD-10-CM | POA: Diagnosis not present

## 2023-10-29 DIAGNOSIS — Z8541 Personal history of malignant neoplasm of cervix uteri: Secondary | ICD-10-CM | POA: Diagnosis not present

## 2023-10-29 DIAGNOSIS — N1832 Chronic kidney disease, stage 3b: Secondary | ICD-10-CM | POA: Diagnosis not present

## 2023-10-29 DIAGNOSIS — D631 Anemia in chronic kidney disease: Secondary | ICD-10-CM | POA: Diagnosis not present

## 2023-10-29 DIAGNOSIS — Z96643 Presence of artificial hip joint, bilateral: Secondary | ICD-10-CM | POA: Diagnosis not present

## 2023-11-01 DIAGNOSIS — I5030 Unspecified diastolic (congestive) heart failure: Secondary | ICD-10-CM | POA: Diagnosis not present

## 2023-11-01 DIAGNOSIS — Z792 Long term (current) use of antibiotics: Secondary | ICD-10-CM | POA: Diagnosis not present

## 2023-11-01 DIAGNOSIS — Z87440 Personal history of urinary (tract) infections: Secondary | ICD-10-CM | POA: Diagnosis not present

## 2023-11-01 DIAGNOSIS — E559 Vitamin D deficiency, unspecified: Secondary | ICD-10-CM | POA: Diagnosis not present

## 2023-11-01 DIAGNOSIS — Z993 Dependence on wheelchair: Secondary | ICD-10-CM | POA: Diagnosis not present

## 2023-11-01 DIAGNOSIS — N1832 Chronic kidney disease, stage 3b: Secondary | ICD-10-CM | POA: Diagnosis not present

## 2023-11-01 DIAGNOSIS — E538 Deficiency of other specified B group vitamins: Secondary | ICD-10-CM | POA: Diagnosis not present

## 2023-11-01 DIAGNOSIS — Z96643 Presence of artificial hip joint, bilateral: Secondary | ICD-10-CM | POA: Diagnosis not present

## 2023-11-01 DIAGNOSIS — Z8541 Personal history of malignant neoplasm of cervix uteri: Secondary | ICD-10-CM | POA: Diagnosis not present

## 2023-11-01 DIAGNOSIS — Z8616 Personal history of COVID-19: Secondary | ICD-10-CM | POA: Diagnosis not present

## 2023-11-01 DIAGNOSIS — I130 Hypertensive heart and chronic kidney disease with heart failure and stage 1 through stage 4 chronic kidney disease, or unspecified chronic kidney disease: Secondary | ICD-10-CM | POA: Diagnosis not present

## 2023-11-01 DIAGNOSIS — Z87442 Personal history of urinary calculi: Secondary | ICD-10-CM | POA: Diagnosis not present

## 2023-11-01 DIAGNOSIS — D631 Anemia in chronic kidney disease: Secondary | ICD-10-CM | POA: Diagnosis not present

## 2023-11-01 DIAGNOSIS — I959 Hypotension, unspecified: Secondary | ICD-10-CM | POA: Diagnosis not present

## 2023-11-01 DIAGNOSIS — M069 Rheumatoid arthritis, unspecified: Secondary | ICD-10-CM | POA: Diagnosis not present

## 2023-11-01 DIAGNOSIS — N139 Obstructive and reflux uropathy, unspecified: Secondary | ICD-10-CM | POA: Diagnosis not present

## 2023-11-01 DIAGNOSIS — Z96651 Presence of right artificial knee joint: Secondary | ICD-10-CM | POA: Diagnosis not present

## 2023-11-01 DIAGNOSIS — A0472 Enterocolitis due to Clostridium difficile, not specified as recurrent: Secondary | ICD-10-CM | POA: Diagnosis not present

## 2023-11-05 DIAGNOSIS — I5030 Unspecified diastolic (congestive) heart failure: Secondary | ICD-10-CM | POA: Diagnosis not present

## 2023-11-05 DIAGNOSIS — Z87442 Personal history of urinary calculi: Secondary | ICD-10-CM | POA: Diagnosis not present

## 2023-11-05 DIAGNOSIS — Z8541 Personal history of malignant neoplasm of cervix uteri: Secondary | ICD-10-CM | POA: Diagnosis not present

## 2023-11-05 DIAGNOSIS — I959 Hypotension, unspecified: Secondary | ICD-10-CM | POA: Diagnosis not present

## 2023-11-05 DIAGNOSIS — I130 Hypertensive heart and chronic kidney disease with heart failure and stage 1 through stage 4 chronic kidney disease, or unspecified chronic kidney disease: Secondary | ICD-10-CM | POA: Diagnosis not present

## 2023-11-05 DIAGNOSIS — Z792 Long term (current) use of antibiotics: Secondary | ICD-10-CM | POA: Diagnosis not present

## 2023-11-05 DIAGNOSIS — Z8616 Personal history of COVID-19: Secondary | ICD-10-CM | POA: Diagnosis not present

## 2023-11-05 DIAGNOSIS — N139 Obstructive and reflux uropathy, unspecified: Secondary | ICD-10-CM | POA: Diagnosis not present

## 2023-11-05 DIAGNOSIS — Z96651 Presence of right artificial knee joint: Secondary | ICD-10-CM | POA: Diagnosis not present

## 2023-11-05 DIAGNOSIS — M069 Rheumatoid arthritis, unspecified: Secondary | ICD-10-CM | POA: Diagnosis not present

## 2023-11-05 DIAGNOSIS — Z993 Dependence on wheelchair: Secondary | ICD-10-CM | POA: Diagnosis not present

## 2023-11-05 DIAGNOSIS — D631 Anemia in chronic kidney disease: Secondary | ICD-10-CM | POA: Diagnosis not present

## 2023-11-05 DIAGNOSIS — E559 Vitamin D deficiency, unspecified: Secondary | ICD-10-CM | POA: Diagnosis not present

## 2023-11-05 DIAGNOSIS — N1832 Chronic kidney disease, stage 3b: Secondary | ICD-10-CM | POA: Diagnosis not present

## 2023-11-05 DIAGNOSIS — E538 Deficiency of other specified B group vitamins: Secondary | ICD-10-CM | POA: Diagnosis not present

## 2023-11-05 DIAGNOSIS — A0472 Enterocolitis due to Clostridium difficile, not specified as recurrent: Secondary | ICD-10-CM | POA: Diagnosis not present

## 2023-11-05 DIAGNOSIS — Z96643 Presence of artificial hip joint, bilateral: Secondary | ICD-10-CM | POA: Diagnosis not present

## 2023-11-05 DIAGNOSIS — Z87440 Personal history of urinary (tract) infections: Secondary | ICD-10-CM | POA: Diagnosis not present

## 2023-11-07 DIAGNOSIS — Z8541 Personal history of malignant neoplasm of cervix uteri: Secondary | ICD-10-CM | POA: Diagnosis not present

## 2023-11-07 DIAGNOSIS — Z792 Long term (current) use of antibiotics: Secondary | ICD-10-CM | POA: Diagnosis not present

## 2023-11-07 DIAGNOSIS — M069 Rheumatoid arthritis, unspecified: Secondary | ICD-10-CM | POA: Diagnosis not present

## 2023-11-07 DIAGNOSIS — Z993 Dependence on wheelchair: Secondary | ICD-10-CM | POA: Diagnosis not present

## 2023-11-07 DIAGNOSIS — Z96651 Presence of right artificial knee joint: Secondary | ICD-10-CM | POA: Diagnosis not present

## 2023-11-07 DIAGNOSIS — Z96643 Presence of artificial hip joint, bilateral: Secondary | ICD-10-CM | POA: Diagnosis not present

## 2023-11-07 DIAGNOSIS — I5030 Unspecified diastolic (congestive) heart failure: Secondary | ICD-10-CM | POA: Diagnosis not present

## 2023-11-07 DIAGNOSIS — I959 Hypotension, unspecified: Secondary | ICD-10-CM | POA: Diagnosis not present

## 2023-11-07 DIAGNOSIS — E559 Vitamin D deficiency, unspecified: Secondary | ICD-10-CM | POA: Diagnosis not present

## 2023-11-07 DIAGNOSIS — Z87440 Personal history of urinary (tract) infections: Secondary | ICD-10-CM | POA: Diagnosis not present

## 2023-11-07 DIAGNOSIS — I130 Hypertensive heart and chronic kidney disease with heart failure and stage 1 through stage 4 chronic kidney disease, or unspecified chronic kidney disease: Secondary | ICD-10-CM | POA: Diagnosis not present

## 2023-11-07 DIAGNOSIS — E538 Deficiency of other specified B group vitamins: Secondary | ICD-10-CM | POA: Diagnosis not present

## 2023-11-07 DIAGNOSIS — N1832 Chronic kidney disease, stage 3b: Secondary | ICD-10-CM | POA: Diagnosis not present

## 2023-11-07 DIAGNOSIS — A0472 Enterocolitis due to Clostridium difficile, not specified as recurrent: Secondary | ICD-10-CM | POA: Diagnosis not present

## 2023-11-07 DIAGNOSIS — Z8616 Personal history of COVID-19: Secondary | ICD-10-CM | POA: Diagnosis not present

## 2023-11-07 DIAGNOSIS — N139 Obstructive and reflux uropathy, unspecified: Secondary | ICD-10-CM | POA: Diagnosis not present

## 2023-11-07 DIAGNOSIS — Z87442 Personal history of urinary calculi: Secondary | ICD-10-CM | POA: Diagnosis not present

## 2023-11-07 DIAGNOSIS — D631 Anemia in chronic kidney disease: Secondary | ICD-10-CM | POA: Diagnosis not present

## 2023-11-15 DIAGNOSIS — Z993 Dependence on wheelchair: Secondary | ICD-10-CM | POA: Diagnosis not present

## 2023-11-15 DIAGNOSIS — Z96651 Presence of right artificial knee joint: Secondary | ICD-10-CM | POA: Diagnosis not present

## 2023-11-15 DIAGNOSIS — D631 Anemia in chronic kidney disease: Secondary | ICD-10-CM | POA: Diagnosis not present

## 2023-11-15 DIAGNOSIS — Z8541 Personal history of malignant neoplasm of cervix uteri: Secondary | ICD-10-CM | POA: Diagnosis not present

## 2023-11-15 DIAGNOSIS — A0472 Enterocolitis due to Clostridium difficile, not specified as recurrent: Secondary | ICD-10-CM | POA: Diagnosis not present

## 2023-11-15 DIAGNOSIS — Z87440 Personal history of urinary (tract) infections: Secondary | ICD-10-CM | POA: Diagnosis not present

## 2023-11-15 DIAGNOSIS — Z792 Long term (current) use of antibiotics: Secondary | ICD-10-CM | POA: Diagnosis not present

## 2023-11-15 DIAGNOSIS — I959 Hypotension, unspecified: Secondary | ICD-10-CM | POA: Diagnosis not present

## 2023-11-15 DIAGNOSIS — Z96643 Presence of artificial hip joint, bilateral: Secondary | ICD-10-CM | POA: Diagnosis not present

## 2023-11-15 DIAGNOSIS — I130 Hypertensive heart and chronic kidney disease with heart failure and stage 1 through stage 4 chronic kidney disease, or unspecified chronic kidney disease: Secondary | ICD-10-CM | POA: Diagnosis not present

## 2023-11-15 DIAGNOSIS — M069 Rheumatoid arthritis, unspecified: Secondary | ICD-10-CM | POA: Diagnosis not present

## 2023-11-15 DIAGNOSIS — E559 Vitamin D deficiency, unspecified: Secondary | ICD-10-CM | POA: Diagnosis not present

## 2023-11-15 DIAGNOSIS — Z8616 Personal history of COVID-19: Secondary | ICD-10-CM | POA: Diagnosis not present

## 2023-11-15 DIAGNOSIS — N1832 Chronic kidney disease, stage 3b: Secondary | ICD-10-CM | POA: Diagnosis not present

## 2023-11-15 DIAGNOSIS — Z87442 Personal history of urinary calculi: Secondary | ICD-10-CM | POA: Diagnosis not present

## 2023-11-15 DIAGNOSIS — I5030 Unspecified diastolic (congestive) heart failure: Secondary | ICD-10-CM | POA: Diagnosis not present

## 2023-11-15 DIAGNOSIS — E538 Deficiency of other specified B group vitamins: Secondary | ICD-10-CM | POA: Diagnosis not present

## 2023-11-15 DIAGNOSIS — N139 Obstructive and reflux uropathy, unspecified: Secondary | ICD-10-CM | POA: Diagnosis not present

## 2023-11-19 DIAGNOSIS — Z96651 Presence of right artificial knee joint: Secondary | ICD-10-CM | POA: Diagnosis not present

## 2023-11-19 DIAGNOSIS — M069 Rheumatoid arthritis, unspecified: Secondary | ICD-10-CM | POA: Diagnosis not present

## 2023-11-19 DIAGNOSIS — N1832 Chronic kidney disease, stage 3b: Secondary | ICD-10-CM | POA: Diagnosis not present

## 2023-11-19 DIAGNOSIS — E559 Vitamin D deficiency, unspecified: Secondary | ICD-10-CM | POA: Diagnosis not present

## 2023-11-19 DIAGNOSIS — I130 Hypertensive heart and chronic kidney disease with heart failure and stage 1 through stage 4 chronic kidney disease, or unspecified chronic kidney disease: Secondary | ICD-10-CM | POA: Diagnosis not present

## 2023-11-19 DIAGNOSIS — D631 Anemia in chronic kidney disease: Secondary | ICD-10-CM | POA: Diagnosis not present

## 2023-11-19 DIAGNOSIS — Z8541 Personal history of malignant neoplasm of cervix uteri: Secondary | ICD-10-CM | POA: Diagnosis not present

## 2023-11-19 DIAGNOSIS — Z87440 Personal history of urinary (tract) infections: Secondary | ICD-10-CM | POA: Diagnosis not present

## 2023-11-19 DIAGNOSIS — Z993 Dependence on wheelchair: Secondary | ICD-10-CM | POA: Diagnosis not present

## 2023-11-19 DIAGNOSIS — Z8616 Personal history of COVID-19: Secondary | ICD-10-CM | POA: Diagnosis not present

## 2023-11-19 DIAGNOSIS — Z87442 Personal history of urinary calculi: Secondary | ICD-10-CM | POA: Diagnosis not present

## 2023-11-19 DIAGNOSIS — I5030 Unspecified diastolic (congestive) heart failure: Secondary | ICD-10-CM | POA: Diagnosis not present

## 2023-11-19 DIAGNOSIS — Z96643 Presence of artificial hip joint, bilateral: Secondary | ICD-10-CM | POA: Diagnosis not present

## 2023-11-19 DIAGNOSIS — Z792 Long term (current) use of antibiotics: Secondary | ICD-10-CM | POA: Diagnosis not present

## 2023-11-19 DIAGNOSIS — E538 Deficiency of other specified B group vitamins: Secondary | ICD-10-CM | POA: Diagnosis not present

## 2023-11-19 DIAGNOSIS — N139 Obstructive and reflux uropathy, unspecified: Secondary | ICD-10-CM | POA: Diagnosis not present

## 2023-11-19 DIAGNOSIS — A0472 Enterocolitis due to Clostridium difficile, not specified as recurrent: Secondary | ICD-10-CM | POA: Diagnosis not present

## 2023-11-19 DIAGNOSIS — I959 Hypotension, unspecified: Secondary | ICD-10-CM | POA: Diagnosis not present

## 2023-11-26 DIAGNOSIS — Z792 Long term (current) use of antibiotics: Secondary | ICD-10-CM | POA: Diagnosis not present

## 2023-11-26 DIAGNOSIS — M069 Rheumatoid arthritis, unspecified: Secondary | ICD-10-CM | POA: Diagnosis not present

## 2023-11-26 DIAGNOSIS — I959 Hypotension, unspecified: Secondary | ICD-10-CM | POA: Diagnosis not present

## 2023-11-26 DIAGNOSIS — Z8616 Personal history of COVID-19: Secondary | ICD-10-CM | POA: Diagnosis not present

## 2023-11-26 DIAGNOSIS — Z96643 Presence of artificial hip joint, bilateral: Secondary | ICD-10-CM | POA: Diagnosis not present

## 2023-11-26 DIAGNOSIS — I130 Hypertensive heart and chronic kidney disease with heart failure and stage 1 through stage 4 chronic kidney disease, or unspecified chronic kidney disease: Secondary | ICD-10-CM | POA: Diagnosis not present

## 2023-11-26 DIAGNOSIS — I5030 Unspecified diastolic (congestive) heart failure: Secondary | ICD-10-CM | POA: Diagnosis not present

## 2023-11-26 DIAGNOSIS — D631 Anemia in chronic kidney disease: Secondary | ICD-10-CM | POA: Diagnosis not present

## 2023-11-26 DIAGNOSIS — E559 Vitamin D deficiency, unspecified: Secondary | ICD-10-CM | POA: Diagnosis not present

## 2023-11-26 DIAGNOSIS — N1832 Chronic kidney disease, stage 3b: Secondary | ICD-10-CM | POA: Diagnosis not present

## 2023-11-26 DIAGNOSIS — Z96651 Presence of right artificial knee joint: Secondary | ICD-10-CM | POA: Diagnosis not present

## 2023-11-26 DIAGNOSIS — Z87440 Personal history of urinary (tract) infections: Secondary | ICD-10-CM | POA: Diagnosis not present

## 2023-11-26 DIAGNOSIS — Z87442 Personal history of urinary calculi: Secondary | ICD-10-CM | POA: Diagnosis not present

## 2023-11-26 DIAGNOSIS — E538 Deficiency of other specified B group vitamins: Secondary | ICD-10-CM | POA: Diagnosis not present

## 2023-11-26 DIAGNOSIS — Z993 Dependence on wheelchair: Secondary | ICD-10-CM | POA: Diagnosis not present

## 2023-11-26 DIAGNOSIS — N139 Obstructive and reflux uropathy, unspecified: Secondary | ICD-10-CM | POA: Diagnosis not present

## 2023-11-26 DIAGNOSIS — A0472 Enterocolitis due to Clostridium difficile, not specified as recurrent: Secondary | ICD-10-CM | POA: Diagnosis not present

## 2023-11-26 DIAGNOSIS — Z8541 Personal history of malignant neoplasm of cervix uteri: Secondary | ICD-10-CM | POA: Diagnosis not present

## 2023-12-03 DIAGNOSIS — I130 Hypertensive heart and chronic kidney disease with heart failure and stage 1 through stage 4 chronic kidney disease, or unspecified chronic kidney disease: Secondary | ICD-10-CM | POA: Diagnosis not present

## 2023-12-03 DIAGNOSIS — M069 Rheumatoid arthritis, unspecified: Secondary | ICD-10-CM | POA: Diagnosis not present

## 2023-12-03 DIAGNOSIS — E559 Vitamin D deficiency, unspecified: Secondary | ICD-10-CM | POA: Diagnosis not present

## 2023-12-03 DIAGNOSIS — N1832 Chronic kidney disease, stage 3b: Secondary | ICD-10-CM | POA: Diagnosis not present

## 2023-12-03 DIAGNOSIS — I959 Hypotension, unspecified: Secondary | ICD-10-CM | POA: Diagnosis not present

## 2023-12-03 DIAGNOSIS — D631 Anemia in chronic kidney disease: Secondary | ICD-10-CM | POA: Diagnosis not present

## 2023-12-03 DIAGNOSIS — I5030 Unspecified diastolic (congestive) heart failure: Secondary | ICD-10-CM | POA: Diagnosis not present

## 2023-12-05 DIAGNOSIS — R399 Unspecified symptoms and signs involving the genitourinary system: Secondary | ICD-10-CM | POA: Diagnosis not present

## 2023-12-10 DIAGNOSIS — Z96643 Presence of artificial hip joint, bilateral: Secondary | ICD-10-CM | POA: Diagnosis not present

## 2023-12-10 DIAGNOSIS — Z792 Long term (current) use of antibiotics: Secondary | ICD-10-CM | POA: Diagnosis not present

## 2023-12-10 DIAGNOSIS — Z8541 Personal history of malignant neoplasm of cervix uteri: Secondary | ICD-10-CM | POA: Diagnosis not present

## 2023-12-10 DIAGNOSIS — Z96651 Presence of right artificial knee joint: Secondary | ICD-10-CM | POA: Diagnosis not present

## 2023-12-10 DIAGNOSIS — E538 Deficiency of other specified B group vitamins: Secondary | ICD-10-CM | POA: Diagnosis not present

## 2023-12-10 DIAGNOSIS — N1832 Chronic kidney disease, stage 3b: Secondary | ICD-10-CM | POA: Diagnosis not present

## 2023-12-10 DIAGNOSIS — I130 Hypertensive heart and chronic kidney disease with heart failure and stage 1 through stage 4 chronic kidney disease, or unspecified chronic kidney disease: Secondary | ICD-10-CM | POA: Diagnosis not present

## 2023-12-10 DIAGNOSIS — Z993 Dependence on wheelchair: Secondary | ICD-10-CM | POA: Diagnosis not present

## 2023-12-10 DIAGNOSIS — I5030 Unspecified diastolic (congestive) heart failure: Secondary | ICD-10-CM | POA: Diagnosis not present

## 2023-12-10 DIAGNOSIS — Z87440 Personal history of urinary (tract) infections: Secondary | ICD-10-CM | POA: Diagnosis not present

## 2023-12-10 DIAGNOSIS — D631 Anemia in chronic kidney disease: Secondary | ICD-10-CM | POA: Diagnosis not present

## 2023-12-10 DIAGNOSIS — I959 Hypotension, unspecified: Secondary | ICD-10-CM | POA: Diagnosis not present

## 2023-12-10 DIAGNOSIS — A0472 Enterocolitis due to Clostridium difficile, not specified as recurrent: Secondary | ICD-10-CM | POA: Diagnosis not present

## 2023-12-10 DIAGNOSIS — Z8616 Personal history of COVID-19: Secondary | ICD-10-CM | POA: Diagnosis not present

## 2023-12-10 DIAGNOSIS — E559 Vitamin D deficiency, unspecified: Secondary | ICD-10-CM | POA: Diagnosis not present

## 2023-12-10 DIAGNOSIS — M069 Rheumatoid arthritis, unspecified: Secondary | ICD-10-CM | POA: Diagnosis not present

## 2023-12-10 DIAGNOSIS — Z87442 Personal history of urinary calculi: Secondary | ICD-10-CM | POA: Diagnosis not present

## 2023-12-10 DIAGNOSIS — N139 Obstructive and reflux uropathy, unspecified: Secondary | ICD-10-CM | POA: Diagnosis not present

## 2023-12-17 DIAGNOSIS — A0472 Enterocolitis due to Clostridium difficile, not specified as recurrent: Secondary | ICD-10-CM | POA: Diagnosis not present

## 2023-12-17 DIAGNOSIS — Z87442 Personal history of urinary calculi: Secondary | ICD-10-CM | POA: Diagnosis not present

## 2023-12-17 DIAGNOSIS — E559 Vitamin D deficiency, unspecified: Secondary | ICD-10-CM | POA: Diagnosis not present

## 2023-12-17 DIAGNOSIS — N1832 Chronic kidney disease, stage 3b: Secondary | ICD-10-CM | POA: Diagnosis not present

## 2023-12-17 DIAGNOSIS — I959 Hypotension, unspecified: Secondary | ICD-10-CM | POA: Diagnosis not present

## 2023-12-17 DIAGNOSIS — M069 Rheumatoid arthritis, unspecified: Secondary | ICD-10-CM | POA: Diagnosis not present

## 2023-12-17 DIAGNOSIS — N139 Obstructive and reflux uropathy, unspecified: Secondary | ICD-10-CM | POA: Diagnosis not present

## 2023-12-17 DIAGNOSIS — Z792 Long term (current) use of antibiotics: Secondary | ICD-10-CM | POA: Diagnosis not present

## 2023-12-17 DIAGNOSIS — D631 Anemia in chronic kidney disease: Secondary | ICD-10-CM | POA: Diagnosis not present

## 2023-12-17 DIAGNOSIS — Z8541 Personal history of malignant neoplasm of cervix uteri: Secondary | ICD-10-CM | POA: Diagnosis not present

## 2023-12-17 DIAGNOSIS — Z96643 Presence of artificial hip joint, bilateral: Secondary | ICD-10-CM | POA: Diagnosis not present

## 2023-12-17 DIAGNOSIS — Z96651 Presence of right artificial knee joint: Secondary | ICD-10-CM | POA: Diagnosis not present

## 2023-12-17 DIAGNOSIS — I130 Hypertensive heart and chronic kidney disease with heart failure and stage 1 through stage 4 chronic kidney disease, or unspecified chronic kidney disease: Secondary | ICD-10-CM | POA: Diagnosis not present

## 2023-12-17 DIAGNOSIS — E538 Deficiency of other specified B group vitamins: Secondary | ICD-10-CM | POA: Diagnosis not present

## 2023-12-17 DIAGNOSIS — Z993 Dependence on wheelchair: Secondary | ICD-10-CM | POA: Diagnosis not present

## 2023-12-17 DIAGNOSIS — I5030 Unspecified diastolic (congestive) heart failure: Secondary | ICD-10-CM | POA: Diagnosis not present

## 2023-12-17 DIAGNOSIS — Z8616 Personal history of COVID-19: Secondary | ICD-10-CM | POA: Diagnosis not present

## 2023-12-17 DIAGNOSIS — Z87440 Personal history of urinary (tract) infections: Secondary | ICD-10-CM | POA: Diagnosis not present

## 2023-12-20 DIAGNOSIS — M069 Rheumatoid arthritis, unspecified: Secondary | ICD-10-CM | POA: Diagnosis not present

## 2023-12-20 DIAGNOSIS — E538 Deficiency of other specified B group vitamins: Secondary | ICD-10-CM | POA: Diagnosis not present

## 2023-12-20 DIAGNOSIS — I5030 Unspecified diastolic (congestive) heart failure: Secondary | ICD-10-CM | POA: Diagnosis not present

## 2023-12-20 DIAGNOSIS — N139 Obstructive and reflux uropathy, unspecified: Secondary | ICD-10-CM | POA: Diagnosis not present

## 2023-12-20 DIAGNOSIS — I959 Hypotension, unspecified: Secondary | ICD-10-CM | POA: Diagnosis not present

## 2023-12-20 DIAGNOSIS — E559 Vitamin D deficiency, unspecified: Secondary | ICD-10-CM | POA: Diagnosis not present

## 2023-12-20 DIAGNOSIS — I130 Hypertensive heart and chronic kidney disease with heart failure and stage 1 through stage 4 chronic kidney disease, or unspecified chronic kidney disease: Secondary | ICD-10-CM | POA: Diagnosis not present

## 2023-12-20 DIAGNOSIS — D631 Anemia in chronic kidney disease: Secondary | ICD-10-CM | POA: Diagnosis not present

## 2023-12-20 DIAGNOSIS — N1832 Chronic kidney disease, stage 3b: Secondary | ICD-10-CM | POA: Diagnosis not present

## 2023-12-20 DIAGNOSIS — Z993 Dependence on wheelchair: Secondary | ICD-10-CM | POA: Diagnosis not present

## 2023-12-20 DIAGNOSIS — Z96651 Presence of right artificial knee joint: Secondary | ICD-10-CM | POA: Diagnosis not present

## 2023-12-20 DIAGNOSIS — Z792 Long term (current) use of antibiotics: Secondary | ICD-10-CM | POA: Diagnosis not present

## 2023-12-20 DIAGNOSIS — Z8616 Personal history of COVID-19: Secondary | ICD-10-CM | POA: Diagnosis not present

## 2023-12-20 DIAGNOSIS — Z8541 Personal history of malignant neoplasm of cervix uteri: Secondary | ICD-10-CM | POA: Diagnosis not present

## 2023-12-20 DIAGNOSIS — Z87442 Personal history of urinary calculi: Secondary | ICD-10-CM | POA: Diagnosis not present

## 2023-12-20 DIAGNOSIS — A0472 Enterocolitis due to Clostridium difficile, not specified as recurrent: Secondary | ICD-10-CM | POA: Diagnosis not present

## 2023-12-20 DIAGNOSIS — Z96643 Presence of artificial hip joint, bilateral: Secondary | ICD-10-CM | POA: Diagnosis not present

## 2023-12-20 DIAGNOSIS — Z87440 Personal history of urinary (tract) infections: Secondary | ICD-10-CM | POA: Diagnosis not present

## 2023-12-24 DIAGNOSIS — Z8616 Personal history of COVID-19: Secondary | ICD-10-CM | POA: Diagnosis not present

## 2023-12-24 DIAGNOSIS — Z792 Long term (current) use of antibiotics: Secondary | ICD-10-CM | POA: Diagnosis not present

## 2023-12-24 DIAGNOSIS — Z87440 Personal history of urinary (tract) infections: Secondary | ICD-10-CM | POA: Diagnosis not present

## 2023-12-24 DIAGNOSIS — E538 Deficiency of other specified B group vitamins: Secondary | ICD-10-CM | POA: Diagnosis not present

## 2023-12-24 DIAGNOSIS — Z96643 Presence of artificial hip joint, bilateral: Secondary | ICD-10-CM | POA: Diagnosis not present

## 2023-12-24 DIAGNOSIS — E559 Vitamin D deficiency, unspecified: Secondary | ICD-10-CM | POA: Diagnosis not present

## 2023-12-24 DIAGNOSIS — Z96651 Presence of right artificial knee joint: Secondary | ICD-10-CM | POA: Diagnosis not present

## 2023-12-24 DIAGNOSIS — Z87442 Personal history of urinary calculi: Secondary | ICD-10-CM | POA: Diagnosis not present

## 2023-12-24 DIAGNOSIS — Z993 Dependence on wheelchair: Secondary | ICD-10-CM | POA: Diagnosis not present

## 2023-12-24 DIAGNOSIS — N1832 Chronic kidney disease, stage 3b: Secondary | ICD-10-CM | POA: Diagnosis not present

## 2023-12-24 DIAGNOSIS — I5030 Unspecified diastolic (congestive) heart failure: Secondary | ICD-10-CM | POA: Diagnosis not present

## 2023-12-24 DIAGNOSIS — M069 Rheumatoid arthritis, unspecified: Secondary | ICD-10-CM | POA: Diagnosis not present

## 2023-12-24 DIAGNOSIS — A0472 Enterocolitis due to Clostridium difficile, not specified as recurrent: Secondary | ICD-10-CM | POA: Diagnosis not present

## 2023-12-24 DIAGNOSIS — N139 Obstructive and reflux uropathy, unspecified: Secondary | ICD-10-CM | POA: Diagnosis not present

## 2023-12-24 DIAGNOSIS — I130 Hypertensive heart and chronic kidney disease with heart failure and stage 1 through stage 4 chronic kidney disease, or unspecified chronic kidney disease: Secondary | ICD-10-CM | POA: Diagnosis not present

## 2023-12-24 DIAGNOSIS — I959 Hypotension, unspecified: Secondary | ICD-10-CM | POA: Diagnosis not present

## 2023-12-24 DIAGNOSIS — D631 Anemia in chronic kidney disease: Secondary | ICD-10-CM | POA: Diagnosis not present

## 2023-12-24 DIAGNOSIS — Z8541 Personal history of malignant neoplasm of cervix uteri: Secondary | ICD-10-CM | POA: Diagnosis not present

## 2023-12-26 DIAGNOSIS — Z01818 Encounter for other preprocedural examination: Secondary | ICD-10-CM | POA: Diagnosis not present

## 2023-12-26 DIAGNOSIS — T884XXD Failed or difficult intubation, subsequent encounter: Secondary | ICD-10-CM | POA: Diagnosis not present

## 2023-12-26 DIAGNOSIS — E7800 Pure hypercholesterolemia, unspecified: Secondary | ICD-10-CM | POA: Diagnosis not present

## 2023-12-26 DIAGNOSIS — I10 Essential (primary) hypertension: Secondary | ICD-10-CM | POA: Diagnosis not present

## 2023-12-26 DIAGNOSIS — M069 Rheumatoid arthritis, unspecified: Secondary | ICD-10-CM | POA: Diagnosis not present

## 2023-12-26 DIAGNOSIS — I129 Hypertensive chronic kidney disease with stage 1 through stage 4 chronic kidney disease, or unspecified chronic kidney disease: Secondary | ICD-10-CM | POA: Diagnosis not present

## 2023-12-26 DIAGNOSIS — E1122 Type 2 diabetes mellitus with diabetic chronic kidney disease: Secondary | ICD-10-CM | POA: Diagnosis not present

## 2023-12-26 DIAGNOSIS — N183 Chronic kidney disease, stage 3 unspecified: Secondary | ICD-10-CM | POA: Diagnosis not present

## 2023-12-26 DIAGNOSIS — M0689 Other specified rheumatoid arthritis, multiple sites: Secondary | ICD-10-CM | POA: Diagnosis not present

## 2023-12-26 DIAGNOSIS — N1832 Chronic kidney disease, stage 3b: Secondary | ICD-10-CM | POA: Diagnosis not present

## 2024-01-23 ENCOUNTER — Other Ambulatory Visit (HOSPITAL_BASED_OUTPATIENT_CLINIC_OR_DEPARTMENT_OTHER): Payer: Self-pay

## 2024-01-23 DIAGNOSIS — R338 Other retention of urine: Secondary | ICD-10-CM | POA: Diagnosis not present

## 2024-01-23 DIAGNOSIS — R339 Retention of urine, unspecified: Secondary | ICD-10-CM | POA: Diagnosis not present

## 2024-01-23 DIAGNOSIS — N320 Bladder-neck obstruction: Secondary | ICD-10-CM | POA: Diagnosis not present

## 2024-02-05 DIAGNOSIS — E559 Vitamin D deficiency, unspecified: Secondary | ICD-10-CM | POA: Diagnosis not present

## 2024-02-06 DIAGNOSIS — R8290 Unspecified abnormal findings in urine: Secondary | ICD-10-CM | POA: Diagnosis not present

## 2024-02-18 DIAGNOSIS — F1190 Opioid use, unspecified, uncomplicated: Secondary | ICD-10-CM | POA: Diagnosis not present

## 2024-02-18 DIAGNOSIS — Z96641 Presence of right artificial hip joint: Secondary | ICD-10-CM | POA: Diagnosis not present

## 2024-02-18 DIAGNOSIS — M25562 Pain in left knee: Secondary | ICD-10-CM | POA: Diagnosis not present

## 2024-02-18 DIAGNOSIS — Z556 Problems related to health literacy: Secondary | ICD-10-CM | POA: Diagnosis not present

## 2024-02-18 DIAGNOSIS — K8681 Exocrine pancreatic insufficiency: Secondary | ICD-10-CM | POA: Diagnosis not present

## 2024-02-18 DIAGNOSIS — N184 Chronic kidney disease, stage 4 (severe): Secondary | ICD-10-CM | POA: Diagnosis not present

## 2024-02-18 DIAGNOSIS — M25561 Pain in right knee: Secondary | ICD-10-CM | POA: Diagnosis not present

## 2024-02-18 DIAGNOSIS — Z87440 Personal history of urinary (tract) infections: Secondary | ICD-10-CM | POA: Diagnosis not present

## 2024-02-18 DIAGNOSIS — Z435 Encounter for attention to cystostomy: Secondary | ICD-10-CM | POA: Diagnosis not present

## 2024-02-18 DIAGNOSIS — Z87442 Personal history of urinary calculi: Secondary | ICD-10-CM | POA: Diagnosis not present

## 2024-02-18 DIAGNOSIS — H25819 Combined forms of age-related cataract, unspecified eye: Secondary | ICD-10-CM | POA: Diagnosis not present

## 2024-02-18 DIAGNOSIS — G894 Chronic pain syndrome: Secondary | ICD-10-CM | POA: Diagnosis not present

## 2024-02-18 DIAGNOSIS — M05771 Rheumatoid arthritis with rheumatoid factor of right ankle and foot without organ or systems involvement: Secondary | ICD-10-CM | POA: Diagnosis not present

## 2024-02-18 DIAGNOSIS — E559 Vitamin D deficiency, unspecified: Secondary | ICD-10-CM | POA: Diagnosis not present

## 2024-02-18 DIAGNOSIS — Z466 Encounter for fitting and adjustment of urinary device: Secondary | ICD-10-CM | POA: Diagnosis not present

## 2024-02-18 DIAGNOSIS — Z87891 Personal history of nicotine dependence: Secondary | ICD-10-CM | POA: Diagnosis not present

## 2024-02-18 DIAGNOSIS — I129 Hypertensive chronic kidney disease with stage 1 through stage 4 chronic kidney disease, or unspecified chronic kidney disease: Secondary | ICD-10-CM | POA: Diagnosis not present

## 2024-02-24 DIAGNOSIS — F1190 Opioid use, unspecified, uncomplicated: Secondary | ICD-10-CM | POA: Diagnosis not present

## 2024-02-24 DIAGNOSIS — M25562 Pain in left knee: Secondary | ICD-10-CM | POA: Diagnosis not present

## 2024-02-24 DIAGNOSIS — Z87442 Personal history of urinary calculi: Secondary | ICD-10-CM | POA: Diagnosis not present

## 2024-02-24 DIAGNOSIS — N184 Chronic kidney disease, stage 4 (severe): Secondary | ICD-10-CM | POA: Diagnosis not present

## 2024-02-24 DIAGNOSIS — Z466 Encounter for fitting and adjustment of urinary device: Secondary | ICD-10-CM | POA: Diagnosis not present

## 2024-02-24 DIAGNOSIS — M25561 Pain in right knee: Secondary | ICD-10-CM | POA: Diagnosis not present

## 2024-02-24 DIAGNOSIS — Z435 Encounter for attention to cystostomy: Secondary | ICD-10-CM | POA: Diagnosis not present

## 2024-02-24 DIAGNOSIS — M05771 Rheumatoid arthritis with rheumatoid factor of right ankle and foot without organ or systems involvement: Secondary | ICD-10-CM | POA: Diagnosis not present

## 2024-02-24 DIAGNOSIS — G894 Chronic pain syndrome: Secondary | ICD-10-CM | POA: Diagnosis not present

## 2024-02-24 DIAGNOSIS — H25819 Combined forms of age-related cataract, unspecified eye: Secondary | ICD-10-CM | POA: Diagnosis not present

## 2024-02-24 DIAGNOSIS — Z96641 Presence of right artificial hip joint: Secondary | ICD-10-CM | POA: Diagnosis not present

## 2024-02-24 DIAGNOSIS — Z87891 Personal history of nicotine dependence: Secondary | ICD-10-CM | POA: Diagnosis not present

## 2024-02-24 DIAGNOSIS — K8681 Exocrine pancreatic insufficiency: Secondary | ICD-10-CM | POA: Diagnosis not present

## 2024-02-24 DIAGNOSIS — Z556 Problems related to health literacy: Secondary | ICD-10-CM | POA: Diagnosis not present

## 2024-02-24 DIAGNOSIS — Z87440 Personal history of urinary (tract) infections: Secondary | ICD-10-CM | POA: Diagnosis not present

## 2024-02-24 DIAGNOSIS — I129 Hypertensive chronic kidney disease with stage 1 through stage 4 chronic kidney disease, or unspecified chronic kidney disease: Secondary | ICD-10-CM | POA: Diagnosis not present

## 2024-02-24 DIAGNOSIS — E559 Vitamin D deficiency, unspecified: Secondary | ICD-10-CM | POA: Diagnosis not present

## 2024-02-26 DIAGNOSIS — Z433 Encounter for attention to colostomy: Secondary | ICD-10-CM | POA: Diagnosis not present

## 2024-02-26 DIAGNOSIS — Z9359 Other cystostomy status: Secondary | ICD-10-CM | POA: Diagnosis not present

## 2024-03-03 DIAGNOSIS — N184 Chronic kidney disease, stage 4 (severe): Secondary | ICD-10-CM | POA: Diagnosis not present

## 2024-03-03 DIAGNOSIS — Z466 Encounter for fitting and adjustment of urinary device: Secondary | ICD-10-CM | POA: Diagnosis not present

## 2024-03-03 DIAGNOSIS — F1190 Opioid use, unspecified, uncomplicated: Secondary | ICD-10-CM | POA: Diagnosis not present

## 2024-03-03 DIAGNOSIS — M25562 Pain in left knee: Secondary | ICD-10-CM | POA: Diagnosis not present

## 2024-03-03 DIAGNOSIS — M25561 Pain in right knee: Secondary | ICD-10-CM | POA: Diagnosis not present

## 2024-03-03 DIAGNOSIS — K8681 Exocrine pancreatic insufficiency: Secondary | ICD-10-CM | POA: Diagnosis not present

## 2024-03-03 DIAGNOSIS — Z435 Encounter for attention to cystostomy: Secondary | ICD-10-CM | POA: Diagnosis not present

## 2024-03-03 DIAGNOSIS — Z87442 Personal history of urinary calculi: Secondary | ICD-10-CM | POA: Diagnosis not present

## 2024-03-03 DIAGNOSIS — Z87891 Personal history of nicotine dependence: Secondary | ICD-10-CM | POA: Diagnosis not present

## 2024-03-03 DIAGNOSIS — M05771 Rheumatoid arthritis with rheumatoid factor of right ankle and foot without organ or systems involvement: Secondary | ICD-10-CM | POA: Diagnosis not present

## 2024-03-03 DIAGNOSIS — E559 Vitamin D deficiency, unspecified: Secondary | ICD-10-CM | POA: Diagnosis not present

## 2024-03-03 DIAGNOSIS — Z96641 Presence of right artificial hip joint: Secondary | ICD-10-CM | POA: Diagnosis not present

## 2024-03-03 DIAGNOSIS — I129 Hypertensive chronic kidney disease with stage 1 through stage 4 chronic kidney disease, or unspecified chronic kidney disease: Secondary | ICD-10-CM | POA: Diagnosis not present

## 2024-03-03 DIAGNOSIS — Z87440 Personal history of urinary (tract) infections: Secondary | ICD-10-CM | POA: Diagnosis not present

## 2024-03-03 DIAGNOSIS — G894 Chronic pain syndrome: Secondary | ICD-10-CM | POA: Diagnosis not present

## 2024-03-03 DIAGNOSIS — Z556 Problems related to health literacy: Secondary | ICD-10-CM | POA: Diagnosis not present

## 2024-03-03 DIAGNOSIS — H25819 Combined forms of age-related cataract, unspecified eye: Secondary | ICD-10-CM | POA: Diagnosis not present

## 2024-03-06 DIAGNOSIS — M25561 Pain in right knee: Secondary | ICD-10-CM | POA: Diagnosis not present

## 2024-03-06 DIAGNOSIS — Z87442 Personal history of urinary calculi: Secondary | ICD-10-CM | POA: Diagnosis not present

## 2024-03-06 DIAGNOSIS — M05771 Rheumatoid arthritis with rheumatoid factor of right ankle and foot without organ or systems involvement: Secondary | ICD-10-CM | POA: Diagnosis not present

## 2024-03-06 DIAGNOSIS — H25819 Combined forms of age-related cataract, unspecified eye: Secondary | ICD-10-CM | POA: Diagnosis not present

## 2024-03-06 DIAGNOSIS — Z466 Encounter for fitting and adjustment of urinary device: Secondary | ICD-10-CM | POA: Diagnosis not present

## 2024-03-06 DIAGNOSIS — Z87891 Personal history of nicotine dependence: Secondary | ICD-10-CM | POA: Diagnosis not present

## 2024-03-06 DIAGNOSIS — Z556 Problems related to health literacy: Secondary | ICD-10-CM | POA: Diagnosis not present

## 2024-03-06 DIAGNOSIS — Z87440 Personal history of urinary (tract) infections: Secondary | ICD-10-CM | POA: Diagnosis not present

## 2024-03-06 DIAGNOSIS — K8681 Exocrine pancreatic insufficiency: Secondary | ICD-10-CM | POA: Diagnosis not present

## 2024-03-06 DIAGNOSIS — Z435 Encounter for attention to cystostomy: Secondary | ICD-10-CM | POA: Diagnosis not present

## 2024-03-06 DIAGNOSIS — Z96641 Presence of right artificial hip joint: Secondary | ICD-10-CM | POA: Diagnosis not present

## 2024-03-06 DIAGNOSIS — E559 Vitamin D deficiency, unspecified: Secondary | ICD-10-CM | POA: Diagnosis not present

## 2024-03-06 DIAGNOSIS — F1190 Opioid use, unspecified, uncomplicated: Secondary | ICD-10-CM | POA: Diagnosis not present

## 2024-03-06 DIAGNOSIS — I129 Hypertensive chronic kidney disease with stage 1 through stage 4 chronic kidney disease, or unspecified chronic kidney disease: Secondary | ICD-10-CM | POA: Diagnosis not present

## 2024-03-06 DIAGNOSIS — N184 Chronic kidney disease, stage 4 (severe): Secondary | ICD-10-CM | POA: Diagnosis not present

## 2024-03-06 DIAGNOSIS — G894 Chronic pain syndrome: Secondary | ICD-10-CM | POA: Diagnosis not present

## 2024-03-06 DIAGNOSIS — M25562 Pain in left knee: Secondary | ICD-10-CM | POA: Diagnosis not present

## 2024-03-10 DIAGNOSIS — Z87440 Personal history of urinary (tract) infections: Secondary | ICD-10-CM | POA: Diagnosis not present

## 2024-03-10 DIAGNOSIS — M25562 Pain in left knee: Secondary | ICD-10-CM | POA: Diagnosis not present

## 2024-03-10 DIAGNOSIS — K8681 Exocrine pancreatic insufficiency: Secondary | ICD-10-CM | POA: Diagnosis not present

## 2024-03-10 DIAGNOSIS — M25561 Pain in right knee: Secondary | ICD-10-CM | POA: Diagnosis not present

## 2024-03-10 DIAGNOSIS — Z466 Encounter for fitting and adjustment of urinary device: Secondary | ICD-10-CM | POA: Diagnosis not present

## 2024-03-10 DIAGNOSIS — N184 Chronic kidney disease, stage 4 (severe): Secondary | ICD-10-CM | POA: Diagnosis not present

## 2024-03-10 DIAGNOSIS — F1190 Opioid use, unspecified, uncomplicated: Secondary | ICD-10-CM | POA: Diagnosis not present

## 2024-03-10 DIAGNOSIS — Z96641 Presence of right artificial hip joint: Secondary | ICD-10-CM | POA: Diagnosis not present

## 2024-03-10 DIAGNOSIS — M05771 Rheumatoid arthritis with rheumatoid factor of right ankle and foot without organ or systems involvement: Secondary | ICD-10-CM | POA: Diagnosis not present

## 2024-03-10 DIAGNOSIS — Z87442 Personal history of urinary calculi: Secondary | ICD-10-CM | POA: Diagnosis not present

## 2024-03-10 DIAGNOSIS — Z435 Encounter for attention to cystostomy: Secondary | ICD-10-CM | POA: Diagnosis not present

## 2024-03-10 DIAGNOSIS — G894 Chronic pain syndrome: Secondary | ICD-10-CM | POA: Diagnosis not present

## 2024-03-10 DIAGNOSIS — Z87891 Personal history of nicotine dependence: Secondary | ICD-10-CM | POA: Diagnosis not present

## 2024-03-10 DIAGNOSIS — I129 Hypertensive chronic kidney disease with stage 1 through stage 4 chronic kidney disease, or unspecified chronic kidney disease: Secondary | ICD-10-CM | POA: Diagnosis not present

## 2024-03-10 DIAGNOSIS — H25819 Combined forms of age-related cataract, unspecified eye: Secondary | ICD-10-CM | POA: Diagnosis not present

## 2024-03-10 DIAGNOSIS — Z556 Problems related to health literacy: Secondary | ICD-10-CM | POA: Diagnosis not present

## 2024-03-10 DIAGNOSIS — E559 Vitamin D deficiency, unspecified: Secondary | ICD-10-CM | POA: Diagnosis not present

## 2024-03-12 DIAGNOSIS — M25561 Pain in right knee: Secondary | ICD-10-CM | POA: Diagnosis not present

## 2024-03-12 DIAGNOSIS — M25562 Pain in left knee: Secondary | ICD-10-CM | POA: Diagnosis not present

## 2024-03-12 DIAGNOSIS — Z556 Problems related to health literacy: Secondary | ICD-10-CM | POA: Diagnosis not present

## 2024-03-12 DIAGNOSIS — K8681 Exocrine pancreatic insufficiency: Secondary | ICD-10-CM | POA: Diagnosis not present

## 2024-03-12 DIAGNOSIS — Z87442 Personal history of urinary calculi: Secondary | ICD-10-CM | POA: Diagnosis not present

## 2024-03-12 DIAGNOSIS — G894 Chronic pain syndrome: Secondary | ICD-10-CM | POA: Diagnosis not present

## 2024-03-12 DIAGNOSIS — I129 Hypertensive chronic kidney disease with stage 1 through stage 4 chronic kidney disease, or unspecified chronic kidney disease: Secondary | ICD-10-CM | POA: Diagnosis not present

## 2024-03-12 DIAGNOSIS — M05771 Rheumatoid arthritis with rheumatoid factor of right ankle and foot without organ or systems involvement: Secondary | ICD-10-CM | POA: Diagnosis not present

## 2024-03-12 DIAGNOSIS — Z96641 Presence of right artificial hip joint: Secondary | ICD-10-CM | POA: Diagnosis not present

## 2024-03-12 DIAGNOSIS — F1190 Opioid use, unspecified, uncomplicated: Secondary | ICD-10-CM | POA: Diagnosis not present

## 2024-03-12 DIAGNOSIS — E559 Vitamin D deficiency, unspecified: Secondary | ICD-10-CM | POA: Diagnosis not present

## 2024-03-12 DIAGNOSIS — Z87891 Personal history of nicotine dependence: Secondary | ICD-10-CM | POA: Diagnosis not present

## 2024-03-12 DIAGNOSIS — Z435 Encounter for attention to cystostomy: Secondary | ICD-10-CM | POA: Diagnosis not present

## 2024-03-12 DIAGNOSIS — Z87440 Personal history of urinary (tract) infections: Secondary | ICD-10-CM | POA: Diagnosis not present

## 2024-03-12 DIAGNOSIS — N184 Chronic kidney disease, stage 4 (severe): Secondary | ICD-10-CM | POA: Diagnosis not present

## 2024-03-12 DIAGNOSIS — H25819 Combined forms of age-related cataract, unspecified eye: Secondary | ICD-10-CM | POA: Diagnosis not present

## 2024-03-12 DIAGNOSIS — Z466 Encounter for fitting and adjustment of urinary device: Secondary | ICD-10-CM | POA: Diagnosis not present

## 2024-03-18 DIAGNOSIS — I129 Hypertensive chronic kidney disease with stage 1 through stage 4 chronic kidney disease, or unspecified chronic kidney disease: Secondary | ICD-10-CM | POA: Diagnosis not present

## 2024-03-18 DIAGNOSIS — F1190 Opioid use, unspecified, uncomplicated: Secondary | ICD-10-CM | POA: Diagnosis not present

## 2024-03-18 DIAGNOSIS — Z87440 Personal history of urinary (tract) infections: Secondary | ICD-10-CM | POA: Diagnosis not present

## 2024-03-18 DIAGNOSIS — E559 Vitamin D deficiency, unspecified: Secondary | ICD-10-CM | POA: Diagnosis not present

## 2024-03-18 DIAGNOSIS — Z87442 Personal history of urinary calculi: Secondary | ICD-10-CM | POA: Diagnosis not present

## 2024-03-18 DIAGNOSIS — Z87891 Personal history of nicotine dependence: Secondary | ICD-10-CM | POA: Diagnosis not present

## 2024-03-18 DIAGNOSIS — M05771 Rheumatoid arthritis with rheumatoid factor of right ankle and foot without organ or systems involvement: Secondary | ICD-10-CM | POA: Diagnosis not present

## 2024-03-18 DIAGNOSIS — Z466 Encounter for fitting and adjustment of urinary device: Secondary | ICD-10-CM | POA: Diagnosis not present

## 2024-03-18 DIAGNOSIS — H25819 Combined forms of age-related cataract, unspecified eye: Secondary | ICD-10-CM | POA: Diagnosis not present

## 2024-03-18 DIAGNOSIS — M25561 Pain in right knee: Secondary | ICD-10-CM | POA: Diagnosis not present

## 2024-03-18 DIAGNOSIS — G894 Chronic pain syndrome: Secondary | ICD-10-CM | POA: Diagnosis not present

## 2024-03-18 DIAGNOSIS — K8681 Exocrine pancreatic insufficiency: Secondary | ICD-10-CM | POA: Diagnosis not present

## 2024-03-18 DIAGNOSIS — M25562 Pain in left knee: Secondary | ICD-10-CM | POA: Diagnosis not present

## 2024-03-18 DIAGNOSIS — Z556 Problems related to health literacy: Secondary | ICD-10-CM | POA: Diagnosis not present

## 2024-03-18 DIAGNOSIS — N184 Chronic kidney disease, stage 4 (severe): Secondary | ICD-10-CM | POA: Diagnosis not present

## 2024-03-18 DIAGNOSIS — Z435 Encounter for attention to cystostomy: Secondary | ICD-10-CM | POA: Diagnosis not present

## 2024-03-18 DIAGNOSIS — Z96641 Presence of right artificial hip joint: Secondary | ICD-10-CM | POA: Diagnosis not present

## 2024-03-19 DIAGNOSIS — Z87440 Personal history of urinary (tract) infections: Secondary | ICD-10-CM | POA: Diagnosis not present

## 2024-03-19 DIAGNOSIS — M25562 Pain in left knee: Secondary | ICD-10-CM | POA: Diagnosis not present

## 2024-03-19 DIAGNOSIS — Z435 Encounter for attention to cystostomy: Secondary | ICD-10-CM | POA: Diagnosis not present

## 2024-03-19 DIAGNOSIS — Z556 Problems related to health literacy: Secondary | ICD-10-CM | POA: Diagnosis not present

## 2024-03-19 DIAGNOSIS — E559 Vitamin D deficiency, unspecified: Secondary | ICD-10-CM | POA: Diagnosis not present

## 2024-03-19 DIAGNOSIS — I129 Hypertensive chronic kidney disease with stage 1 through stage 4 chronic kidney disease, or unspecified chronic kidney disease: Secondary | ICD-10-CM | POA: Diagnosis not present

## 2024-03-19 DIAGNOSIS — N184 Chronic kidney disease, stage 4 (severe): Secondary | ICD-10-CM | POA: Diagnosis not present

## 2024-03-19 DIAGNOSIS — Z96641 Presence of right artificial hip joint: Secondary | ICD-10-CM | POA: Diagnosis not present

## 2024-03-19 DIAGNOSIS — G894 Chronic pain syndrome: Secondary | ICD-10-CM | POA: Diagnosis not present

## 2024-03-19 DIAGNOSIS — F1190 Opioid use, unspecified, uncomplicated: Secondary | ICD-10-CM | POA: Diagnosis not present

## 2024-03-19 DIAGNOSIS — M05771 Rheumatoid arthritis with rheumatoid factor of right ankle and foot without organ or systems involvement: Secondary | ICD-10-CM | POA: Diagnosis not present

## 2024-03-19 DIAGNOSIS — M25561 Pain in right knee: Secondary | ICD-10-CM | POA: Diagnosis not present

## 2024-03-19 DIAGNOSIS — Z87442 Personal history of urinary calculi: Secondary | ICD-10-CM | POA: Diagnosis not present

## 2024-03-19 DIAGNOSIS — H25819 Combined forms of age-related cataract, unspecified eye: Secondary | ICD-10-CM | POA: Diagnosis not present

## 2024-03-19 DIAGNOSIS — Z466 Encounter for fitting and adjustment of urinary device: Secondary | ICD-10-CM | POA: Diagnosis not present

## 2024-03-19 DIAGNOSIS — K8681 Exocrine pancreatic insufficiency: Secondary | ICD-10-CM | POA: Diagnosis not present

## 2024-03-19 DIAGNOSIS — Z87891 Personal history of nicotine dependence: Secondary | ICD-10-CM | POA: Diagnosis not present

## 2024-03-24 DIAGNOSIS — K8681 Exocrine pancreatic insufficiency: Secondary | ICD-10-CM | POA: Diagnosis not present

## 2024-03-24 DIAGNOSIS — N184 Chronic kidney disease, stage 4 (severe): Secondary | ICD-10-CM | POA: Diagnosis not present

## 2024-03-24 DIAGNOSIS — Z435 Encounter for attention to cystostomy: Secondary | ICD-10-CM | POA: Diagnosis not present

## 2024-03-24 DIAGNOSIS — Z87442 Personal history of urinary calculi: Secondary | ICD-10-CM | POA: Diagnosis not present

## 2024-03-24 DIAGNOSIS — Z466 Encounter for fitting and adjustment of urinary device: Secondary | ICD-10-CM | POA: Diagnosis not present

## 2024-03-24 DIAGNOSIS — M25562 Pain in left knee: Secondary | ICD-10-CM | POA: Diagnosis not present

## 2024-03-24 DIAGNOSIS — Z96641 Presence of right artificial hip joint: Secondary | ICD-10-CM | POA: Diagnosis not present

## 2024-03-24 DIAGNOSIS — I129 Hypertensive chronic kidney disease with stage 1 through stage 4 chronic kidney disease, or unspecified chronic kidney disease: Secondary | ICD-10-CM | POA: Diagnosis not present

## 2024-03-24 DIAGNOSIS — Z87440 Personal history of urinary (tract) infections: Secondary | ICD-10-CM | POA: Diagnosis not present

## 2024-03-24 DIAGNOSIS — M05771 Rheumatoid arthritis with rheumatoid factor of right ankle and foot without organ or systems involvement: Secondary | ICD-10-CM | POA: Diagnosis not present

## 2024-03-24 DIAGNOSIS — M25561 Pain in right knee: Secondary | ICD-10-CM | POA: Diagnosis not present

## 2024-03-24 DIAGNOSIS — Z556 Problems related to health literacy: Secondary | ICD-10-CM | POA: Diagnosis not present

## 2024-03-24 DIAGNOSIS — E559 Vitamin D deficiency, unspecified: Secondary | ICD-10-CM | POA: Diagnosis not present

## 2024-03-24 DIAGNOSIS — Z87891 Personal history of nicotine dependence: Secondary | ICD-10-CM | POA: Diagnosis not present

## 2024-03-24 DIAGNOSIS — H25819 Combined forms of age-related cataract, unspecified eye: Secondary | ICD-10-CM | POA: Diagnosis not present

## 2024-03-24 DIAGNOSIS — G894 Chronic pain syndrome: Secondary | ICD-10-CM | POA: Diagnosis not present

## 2024-03-24 DIAGNOSIS — F1190 Opioid use, unspecified, uncomplicated: Secondary | ICD-10-CM | POA: Diagnosis not present

## 2024-04-06 DIAGNOSIS — Z466 Encounter for fitting and adjustment of urinary device: Secondary | ICD-10-CM | POA: Diagnosis not present

## 2024-04-10 DIAGNOSIS — M25562 Pain in left knee: Secondary | ICD-10-CM | POA: Diagnosis not present

## 2024-04-10 DIAGNOSIS — N184 Chronic kidney disease, stage 4 (severe): Secondary | ICD-10-CM | POA: Diagnosis not present

## 2024-04-10 DIAGNOSIS — E559 Vitamin D deficiency, unspecified: Secondary | ICD-10-CM | POA: Diagnosis not present

## 2024-04-10 DIAGNOSIS — G894 Chronic pain syndrome: Secondary | ICD-10-CM | POA: Diagnosis not present

## 2024-04-10 DIAGNOSIS — F1190 Opioid use, unspecified, uncomplicated: Secondary | ICD-10-CM | POA: Diagnosis not present

## 2024-04-10 DIAGNOSIS — K8681 Exocrine pancreatic insufficiency: Secondary | ICD-10-CM | POA: Diagnosis not present

## 2024-04-10 DIAGNOSIS — H25819 Combined forms of age-related cataract, unspecified eye: Secondary | ICD-10-CM | POA: Diagnosis not present

## 2024-04-10 DIAGNOSIS — Z556 Problems related to health literacy: Secondary | ICD-10-CM | POA: Diagnosis not present

## 2024-04-10 DIAGNOSIS — Z87442 Personal history of urinary calculi: Secondary | ICD-10-CM | POA: Diagnosis not present

## 2024-04-10 DIAGNOSIS — Z466 Encounter for fitting and adjustment of urinary device: Secondary | ICD-10-CM | POA: Diagnosis not present

## 2024-04-10 DIAGNOSIS — Z87440 Personal history of urinary (tract) infections: Secondary | ICD-10-CM | POA: Diagnosis not present

## 2024-04-10 DIAGNOSIS — Z87891 Personal history of nicotine dependence: Secondary | ICD-10-CM | POA: Diagnosis not present

## 2024-04-10 DIAGNOSIS — M05771 Rheumatoid arthritis with rheumatoid factor of right ankle and foot without organ or systems involvement: Secondary | ICD-10-CM | POA: Diagnosis not present

## 2024-04-10 DIAGNOSIS — I129 Hypertensive chronic kidney disease with stage 1 through stage 4 chronic kidney disease, or unspecified chronic kidney disease: Secondary | ICD-10-CM | POA: Diagnosis not present

## 2024-04-10 DIAGNOSIS — Z96641 Presence of right artificial hip joint: Secondary | ICD-10-CM | POA: Diagnosis not present

## 2024-04-10 DIAGNOSIS — M25561 Pain in right knee: Secondary | ICD-10-CM | POA: Diagnosis not present

## 2024-04-10 DIAGNOSIS — Z435 Encounter for attention to cystostomy: Secondary | ICD-10-CM | POA: Diagnosis not present

## 2024-04-14 DIAGNOSIS — G894 Chronic pain syndrome: Secondary | ICD-10-CM | POA: Diagnosis not present

## 2024-04-14 DIAGNOSIS — K8681 Exocrine pancreatic insufficiency: Secondary | ICD-10-CM | POA: Diagnosis not present

## 2024-04-14 DIAGNOSIS — M05771 Rheumatoid arthritis with rheumatoid factor of right ankle and foot without organ or systems involvement: Secondary | ICD-10-CM | POA: Diagnosis not present

## 2024-04-14 DIAGNOSIS — Z87442 Personal history of urinary calculi: Secondary | ICD-10-CM | POA: Diagnosis not present

## 2024-04-14 DIAGNOSIS — M25562 Pain in left knee: Secondary | ICD-10-CM | POA: Diagnosis not present

## 2024-04-14 DIAGNOSIS — E559 Vitamin D deficiency, unspecified: Secondary | ICD-10-CM | POA: Diagnosis not present

## 2024-04-14 DIAGNOSIS — I129 Hypertensive chronic kidney disease with stage 1 through stage 4 chronic kidney disease, or unspecified chronic kidney disease: Secondary | ICD-10-CM | POA: Diagnosis not present

## 2024-04-14 DIAGNOSIS — Z466 Encounter for fitting and adjustment of urinary device: Secondary | ICD-10-CM | POA: Diagnosis not present

## 2024-04-14 DIAGNOSIS — Z556 Problems related to health literacy: Secondary | ICD-10-CM | POA: Diagnosis not present

## 2024-04-14 DIAGNOSIS — Z96641 Presence of right artificial hip joint: Secondary | ICD-10-CM | POA: Diagnosis not present

## 2024-04-14 DIAGNOSIS — M25561 Pain in right knee: Secondary | ICD-10-CM | POA: Diagnosis not present

## 2024-04-14 DIAGNOSIS — F1190 Opioid use, unspecified, uncomplicated: Secondary | ICD-10-CM | POA: Diagnosis not present

## 2024-04-14 DIAGNOSIS — Z87891 Personal history of nicotine dependence: Secondary | ICD-10-CM | POA: Diagnosis not present

## 2024-04-14 DIAGNOSIS — Z87440 Personal history of urinary (tract) infections: Secondary | ICD-10-CM | POA: Diagnosis not present

## 2024-04-14 DIAGNOSIS — N184 Chronic kidney disease, stage 4 (severe): Secondary | ICD-10-CM | POA: Diagnosis not present

## 2024-04-14 DIAGNOSIS — Z435 Encounter for attention to cystostomy: Secondary | ICD-10-CM | POA: Diagnosis not present

## 2024-04-14 DIAGNOSIS — H25819 Combined forms of age-related cataract, unspecified eye: Secondary | ICD-10-CM | POA: Diagnosis not present

## 2024-04-17 DIAGNOSIS — H25819 Combined forms of age-related cataract, unspecified eye: Secondary | ICD-10-CM | POA: Diagnosis not present

## 2024-04-17 DIAGNOSIS — I129 Hypertensive chronic kidney disease with stage 1 through stage 4 chronic kidney disease, or unspecified chronic kidney disease: Secondary | ICD-10-CM | POA: Diagnosis not present

## 2024-04-17 DIAGNOSIS — Z466 Encounter for fitting and adjustment of urinary device: Secondary | ICD-10-CM | POA: Diagnosis not present

## 2024-04-17 DIAGNOSIS — Z87891 Personal history of nicotine dependence: Secondary | ICD-10-CM | POA: Diagnosis not present

## 2024-04-17 DIAGNOSIS — G894 Chronic pain syndrome: Secondary | ICD-10-CM | POA: Diagnosis not present

## 2024-04-17 DIAGNOSIS — Z87442 Personal history of urinary calculi: Secondary | ICD-10-CM | POA: Diagnosis not present

## 2024-04-17 DIAGNOSIS — K8681 Exocrine pancreatic insufficiency: Secondary | ICD-10-CM | POA: Diagnosis not present

## 2024-04-17 DIAGNOSIS — E559 Vitamin D deficiency, unspecified: Secondary | ICD-10-CM | POA: Diagnosis not present

## 2024-04-17 DIAGNOSIS — Z87440 Personal history of urinary (tract) infections: Secondary | ICD-10-CM | POA: Diagnosis not present

## 2024-04-17 DIAGNOSIS — Z96641 Presence of right artificial hip joint: Secondary | ICD-10-CM | POA: Diagnosis not present

## 2024-04-17 DIAGNOSIS — M05771 Rheumatoid arthritis with rheumatoid factor of right ankle and foot without organ or systems involvement: Secondary | ICD-10-CM | POA: Diagnosis not present

## 2024-04-17 DIAGNOSIS — M25561 Pain in right knee: Secondary | ICD-10-CM | POA: Diagnosis not present

## 2024-04-17 DIAGNOSIS — F1190 Opioid use, unspecified, uncomplicated: Secondary | ICD-10-CM | POA: Diagnosis not present

## 2024-04-17 DIAGNOSIS — N184 Chronic kidney disease, stage 4 (severe): Secondary | ICD-10-CM | POA: Diagnosis not present

## 2024-04-17 DIAGNOSIS — Z435 Encounter for attention to cystostomy: Secondary | ICD-10-CM | POA: Diagnosis not present

## 2024-04-17 DIAGNOSIS — Z556 Problems related to health literacy: Secondary | ICD-10-CM | POA: Diagnosis not present

## 2024-04-17 DIAGNOSIS — M25562 Pain in left knee: Secondary | ICD-10-CM | POA: Diagnosis not present

## 2024-04-23 DIAGNOSIS — E559 Vitamin D deficiency, unspecified: Secondary | ICD-10-CM | POA: Diagnosis not present

## 2024-04-23 DIAGNOSIS — Z466 Encounter for fitting and adjustment of urinary device: Secondary | ICD-10-CM | POA: Diagnosis not present

## 2024-04-23 DIAGNOSIS — F1190 Opioid use, unspecified, uncomplicated: Secondary | ICD-10-CM | POA: Diagnosis not present

## 2024-04-23 DIAGNOSIS — Z96641 Presence of right artificial hip joint: Secondary | ICD-10-CM | POA: Diagnosis not present

## 2024-04-23 DIAGNOSIS — I129 Hypertensive chronic kidney disease with stage 1 through stage 4 chronic kidney disease, or unspecified chronic kidney disease: Secondary | ICD-10-CM | POA: Diagnosis not present

## 2024-04-23 DIAGNOSIS — Z87440 Personal history of urinary (tract) infections: Secondary | ICD-10-CM | POA: Diagnosis not present

## 2024-04-23 DIAGNOSIS — Z87442 Personal history of urinary calculi: Secondary | ICD-10-CM | POA: Diagnosis not present

## 2024-04-23 DIAGNOSIS — Z435 Encounter for attention to cystostomy: Secondary | ICD-10-CM | POA: Diagnosis not present

## 2024-04-23 DIAGNOSIS — K8681 Exocrine pancreatic insufficiency: Secondary | ICD-10-CM | POA: Diagnosis not present

## 2024-04-23 DIAGNOSIS — H25819 Combined forms of age-related cataract, unspecified eye: Secondary | ICD-10-CM | POA: Diagnosis not present

## 2024-04-23 DIAGNOSIS — M25562 Pain in left knee: Secondary | ICD-10-CM | POA: Diagnosis not present

## 2024-04-23 DIAGNOSIS — M25561 Pain in right knee: Secondary | ICD-10-CM | POA: Diagnosis not present

## 2024-04-23 DIAGNOSIS — G894 Chronic pain syndrome: Secondary | ICD-10-CM | POA: Diagnosis not present

## 2024-04-23 DIAGNOSIS — Z87891 Personal history of nicotine dependence: Secondary | ICD-10-CM | POA: Diagnosis not present

## 2024-04-23 DIAGNOSIS — N184 Chronic kidney disease, stage 4 (severe): Secondary | ICD-10-CM | POA: Diagnosis not present

## 2024-04-23 DIAGNOSIS — Z556 Problems related to health literacy: Secondary | ICD-10-CM | POA: Diagnosis not present

## 2024-04-23 DIAGNOSIS — M05771 Rheumatoid arthritis with rheumatoid factor of right ankle and foot without organ or systems involvement: Secondary | ICD-10-CM | POA: Diagnosis not present

## 2024-04-24 DIAGNOSIS — H25819 Combined forms of age-related cataract, unspecified eye: Secondary | ICD-10-CM | POA: Diagnosis not present

## 2024-04-24 DIAGNOSIS — Z87442 Personal history of urinary calculi: Secondary | ICD-10-CM | POA: Diagnosis not present

## 2024-04-24 DIAGNOSIS — K8681 Exocrine pancreatic insufficiency: Secondary | ICD-10-CM | POA: Diagnosis not present

## 2024-04-24 DIAGNOSIS — N184 Chronic kidney disease, stage 4 (severe): Secondary | ICD-10-CM | POA: Diagnosis not present

## 2024-04-24 DIAGNOSIS — M25561 Pain in right knee: Secondary | ICD-10-CM | POA: Diagnosis not present

## 2024-04-24 DIAGNOSIS — E559 Vitamin D deficiency, unspecified: Secondary | ICD-10-CM | POA: Diagnosis not present

## 2024-04-24 DIAGNOSIS — I129 Hypertensive chronic kidney disease with stage 1 through stage 4 chronic kidney disease, or unspecified chronic kidney disease: Secondary | ICD-10-CM | POA: Diagnosis not present

## 2024-04-24 DIAGNOSIS — Z87440 Personal history of urinary (tract) infections: Secondary | ICD-10-CM | POA: Diagnosis not present

## 2024-04-24 DIAGNOSIS — G894 Chronic pain syndrome: Secondary | ICD-10-CM | POA: Diagnosis not present

## 2024-04-24 DIAGNOSIS — Z556 Problems related to health literacy: Secondary | ICD-10-CM | POA: Diagnosis not present

## 2024-04-24 DIAGNOSIS — F1190 Opioid use, unspecified, uncomplicated: Secondary | ICD-10-CM | POA: Diagnosis not present

## 2024-04-24 DIAGNOSIS — Z87891 Personal history of nicotine dependence: Secondary | ICD-10-CM | POA: Diagnosis not present

## 2024-04-24 DIAGNOSIS — M25562 Pain in left knee: Secondary | ICD-10-CM | POA: Diagnosis not present

## 2024-04-24 DIAGNOSIS — Z466 Encounter for fitting and adjustment of urinary device: Secondary | ICD-10-CM | POA: Diagnosis not present

## 2024-04-24 DIAGNOSIS — Z96641 Presence of right artificial hip joint: Secondary | ICD-10-CM | POA: Diagnosis not present

## 2024-04-24 DIAGNOSIS — M05771 Rheumatoid arthritis with rheumatoid factor of right ankle and foot without organ or systems involvement: Secondary | ICD-10-CM | POA: Diagnosis not present

## 2024-04-24 DIAGNOSIS — Z435 Encounter for attention to cystostomy: Secondary | ICD-10-CM | POA: Diagnosis not present

## 2024-04-27 DIAGNOSIS — I129 Hypertensive chronic kidney disease with stage 1 through stage 4 chronic kidney disease, or unspecified chronic kidney disease: Secondary | ICD-10-CM | POA: Diagnosis not present

## 2024-04-27 DIAGNOSIS — Z556 Problems related to health literacy: Secondary | ICD-10-CM | POA: Diagnosis not present

## 2024-04-27 DIAGNOSIS — Z96641 Presence of right artificial hip joint: Secondary | ICD-10-CM | POA: Diagnosis not present

## 2024-04-27 DIAGNOSIS — K8681 Exocrine pancreatic insufficiency: Secondary | ICD-10-CM | POA: Diagnosis not present

## 2024-04-27 DIAGNOSIS — N184 Chronic kidney disease, stage 4 (severe): Secondary | ICD-10-CM | POA: Diagnosis not present

## 2024-04-27 DIAGNOSIS — M25561 Pain in right knee: Secondary | ICD-10-CM | POA: Diagnosis not present

## 2024-04-27 DIAGNOSIS — F1190 Opioid use, unspecified, uncomplicated: Secondary | ICD-10-CM | POA: Diagnosis not present

## 2024-04-27 DIAGNOSIS — Z435 Encounter for attention to cystostomy: Secondary | ICD-10-CM | POA: Diagnosis not present

## 2024-04-27 DIAGNOSIS — M05771 Rheumatoid arthritis with rheumatoid factor of right ankle and foot without organ or systems involvement: Secondary | ICD-10-CM | POA: Diagnosis not present

## 2024-04-27 DIAGNOSIS — M25562 Pain in left knee: Secondary | ICD-10-CM | POA: Diagnosis not present

## 2024-04-27 DIAGNOSIS — E559 Vitamin D deficiency, unspecified: Secondary | ICD-10-CM | POA: Diagnosis not present

## 2024-04-27 DIAGNOSIS — Z87442 Personal history of urinary calculi: Secondary | ICD-10-CM | POA: Diagnosis not present

## 2024-04-27 DIAGNOSIS — H25819 Combined forms of age-related cataract, unspecified eye: Secondary | ICD-10-CM | POA: Diagnosis not present

## 2024-04-27 DIAGNOSIS — Z87440 Personal history of urinary (tract) infections: Secondary | ICD-10-CM | POA: Diagnosis not present

## 2024-04-27 DIAGNOSIS — Z87891 Personal history of nicotine dependence: Secondary | ICD-10-CM | POA: Diagnosis not present

## 2024-04-27 DIAGNOSIS — Z466 Encounter for fitting and adjustment of urinary device: Secondary | ICD-10-CM | POA: Diagnosis not present

## 2024-04-27 DIAGNOSIS — G894 Chronic pain syndrome: Secondary | ICD-10-CM | POA: Diagnosis not present

## 2024-05-06 DIAGNOSIS — K8681 Exocrine pancreatic insufficiency: Secondary | ICD-10-CM | POA: Diagnosis not present

## 2024-05-06 DIAGNOSIS — E559 Vitamin D deficiency, unspecified: Secondary | ICD-10-CM | POA: Diagnosis not present

## 2024-05-06 DIAGNOSIS — F1190 Opioid use, unspecified, uncomplicated: Secondary | ICD-10-CM | POA: Diagnosis not present

## 2024-05-06 DIAGNOSIS — Z87440 Personal history of urinary (tract) infections: Secondary | ICD-10-CM | POA: Diagnosis not present

## 2024-05-06 DIAGNOSIS — H25819 Combined forms of age-related cataract, unspecified eye: Secondary | ICD-10-CM | POA: Diagnosis not present

## 2024-05-06 DIAGNOSIS — I129 Hypertensive chronic kidney disease with stage 1 through stage 4 chronic kidney disease, or unspecified chronic kidney disease: Secondary | ICD-10-CM | POA: Diagnosis not present

## 2024-05-06 DIAGNOSIS — G894 Chronic pain syndrome: Secondary | ICD-10-CM | POA: Diagnosis not present

## 2024-05-06 DIAGNOSIS — M05771 Rheumatoid arthritis with rheumatoid factor of right ankle and foot without organ or systems involvement: Secondary | ICD-10-CM | POA: Diagnosis not present

## 2024-05-06 DIAGNOSIS — Z556 Problems related to health literacy: Secondary | ICD-10-CM | POA: Diagnosis not present

## 2024-05-06 DIAGNOSIS — Z466 Encounter for fitting and adjustment of urinary device: Secondary | ICD-10-CM | POA: Diagnosis not present

## 2024-05-06 DIAGNOSIS — N184 Chronic kidney disease, stage 4 (severe): Secondary | ICD-10-CM | POA: Diagnosis not present

## 2024-05-06 DIAGNOSIS — Z87891 Personal history of nicotine dependence: Secondary | ICD-10-CM | POA: Diagnosis not present

## 2024-05-06 DIAGNOSIS — M25561 Pain in right knee: Secondary | ICD-10-CM | POA: Diagnosis not present

## 2024-05-06 DIAGNOSIS — Z87442 Personal history of urinary calculi: Secondary | ICD-10-CM | POA: Diagnosis not present

## 2024-05-06 DIAGNOSIS — M25562 Pain in left knee: Secondary | ICD-10-CM | POA: Diagnosis not present

## 2024-05-06 DIAGNOSIS — Z435 Encounter for attention to cystostomy: Secondary | ICD-10-CM | POA: Diagnosis not present

## 2024-05-06 DIAGNOSIS — Z96641 Presence of right artificial hip joint: Secondary | ICD-10-CM | POA: Diagnosis not present

## 2024-05-07 DIAGNOSIS — K8681 Exocrine pancreatic insufficiency: Secondary | ICD-10-CM | POA: Diagnosis not present

## 2024-05-07 DIAGNOSIS — M05771 Rheumatoid arthritis with rheumatoid factor of right ankle and foot without organ or systems involvement: Secondary | ICD-10-CM | POA: Diagnosis not present

## 2024-05-07 DIAGNOSIS — M25561 Pain in right knee: Secondary | ICD-10-CM | POA: Diagnosis not present

## 2024-05-07 DIAGNOSIS — I129 Hypertensive chronic kidney disease with stage 1 through stage 4 chronic kidney disease, or unspecified chronic kidney disease: Secondary | ICD-10-CM | POA: Diagnosis not present

## 2024-05-07 DIAGNOSIS — G894 Chronic pain syndrome: Secondary | ICD-10-CM | POA: Diagnosis not present

## 2024-05-07 DIAGNOSIS — M25562 Pain in left knee: Secondary | ICD-10-CM | POA: Diagnosis not present

## 2024-05-07 DIAGNOSIS — N184 Chronic kidney disease, stage 4 (severe): Secondary | ICD-10-CM | POA: Diagnosis not present

## 2024-05-13 DIAGNOSIS — F1190 Opioid use, unspecified, uncomplicated: Secondary | ICD-10-CM | POA: Diagnosis not present

## 2024-05-13 DIAGNOSIS — E559 Vitamin D deficiency, unspecified: Secondary | ICD-10-CM | POA: Diagnosis not present

## 2024-05-13 DIAGNOSIS — H25819 Combined forms of age-related cataract, unspecified eye: Secondary | ICD-10-CM | POA: Diagnosis not present

## 2024-05-13 DIAGNOSIS — K8681 Exocrine pancreatic insufficiency: Secondary | ICD-10-CM | POA: Diagnosis not present

## 2024-05-13 DIAGNOSIS — N184 Chronic kidney disease, stage 4 (severe): Secondary | ICD-10-CM | POA: Diagnosis not present

## 2024-05-13 DIAGNOSIS — M05771 Rheumatoid arthritis with rheumatoid factor of right ankle and foot without organ or systems involvement: Secondary | ICD-10-CM | POA: Diagnosis not present

## 2024-05-13 DIAGNOSIS — G894 Chronic pain syndrome: Secondary | ICD-10-CM | POA: Diagnosis not present

## 2024-05-13 DIAGNOSIS — Z87891 Personal history of nicotine dependence: Secondary | ICD-10-CM | POA: Diagnosis not present

## 2024-05-13 DIAGNOSIS — M25562 Pain in left knee: Secondary | ICD-10-CM | POA: Diagnosis not present

## 2024-05-13 DIAGNOSIS — I129 Hypertensive chronic kidney disease with stage 1 through stage 4 chronic kidney disease, or unspecified chronic kidney disease: Secondary | ICD-10-CM | POA: Diagnosis not present

## 2024-05-13 DIAGNOSIS — Z87440 Personal history of urinary (tract) infections: Secondary | ICD-10-CM | POA: Diagnosis not present

## 2024-05-13 DIAGNOSIS — Z556 Problems related to health literacy: Secondary | ICD-10-CM | POA: Diagnosis not present

## 2024-05-13 DIAGNOSIS — Z435 Encounter for attention to cystostomy: Secondary | ICD-10-CM | POA: Diagnosis not present

## 2024-05-13 DIAGNOSIS — Z96641 Presence of right artificial hip joint: Secondary | ICD-10-CM | POA: Diagnosis not present

## 2024-05-13 DIAGNOSIS — M25561 Pain in right knee: Secondary | ICD-10-CM | POA: Diagnosis not present

## 2024-05-13 DIAGNOSIS — Z87442 Personal history of urinary calculi: Secondary | ICD-10-CM | POA: Diagnosis not present

## 2024-05-13 DIAGNOSIS — Z466 Encounter for fitting and adjustment of urinary device: Secondary | ICD-10-CM | POA: Diagnosis not present

## 2024-05-14 DIAGNOSIS — N184 Chronic kidney disease, stage 4 (severe): Secondary | ICD-10-CM | POA: Diagnosis not present

## 2024-05-14 DIAGNOSIS — Z87440 Personal history of urinary (tract) infections: Secondary | ICD-10-CM | POA: Diagnosis not present

## 2024-05-14 DIAGNOSIS — M25562 Pain in left knee: Secondary | ICD-10-CM | POA: Diagnosis not present

## 2024-05-14 DIAGNOSIS — G894 Chronic pain syndrome: Secondary | ICD-10-CM | POA: Diagnosis not present

## 2024-05-14 DIAGNOSIS — E559 Vitamin D deficiency, unspecified: Secondary | ICD-10-CM | POA: Diagnosis not present

## 2024-05-14 DIAGNOSIS — Z466 Encounter for fitting and adjustment of urinary device: Secondary | ICD-10-CM | POA: Diagnosis not present

## 2024-05-14 DIAGNOSIS — Z556 Problems related to health literacy: Secondary | ICD-10-CM | POA: Diagnosis not present

## 2024-05-14 DIAGNOSIS — M05771 Rheumatoid arthritis with rheumatoid factor of right ankle and foot without organ or systems involvement: Secondary | ICD-10-CM | POA: Diagnosis not present

## 2024-05-14 DIAGNOSIS — I129 Hypertensive chronic kidney disease with stage 1 through stage 4 chronic kidney disease, or unspecified chronic kidney disease: Secondary | ICD-10-CM | POA: Diagnosis not present

## 2024-05-14 DIAGNOSIS — H25819 Combined forms of age-related cataract, unspecified eye: Secondary | ICD-10-CM | POA: Diagnosis not present

## 2024-05-14 DIAGNOSIS — Z435 Encounter for attention to cystostomy: Secondary | ICD-10-CM | POA: Diagnosis not present

## 2024-05-14 DIAGNOSIS — K8681 Exocrine pancreatic insufficiency: Secondary | ICD-10-CM | POA: Diagnosis not present

## 2024-05-14 DIAGNOSIS — M25561 Pain in right knee: Secondary | ICD-10-CM | POA: Diagnosis not present

## 2024-05-14 DIAGNOSIS — Z87442 Personal history of urinary calculi: Secondary | ICD-10-CM | POA: Diagnosis not present

## 2024-05-14 DIAGNOSIS — Z87891 Personal history of nicotine dependence: Secondary | ICD-10-CM | POA: Diagnosis not present

## 2024-05-14 DIAGNOSIS — F1190 Opioid use, unspecified, uncomplicated: Secondary | ICD-10-CM | POA: Diagnosis not present

## 2024-05-14 DIAGNOSIS — Z96641 Presence of right artificial hip joint: Secondary | ICD-10-CM | POA: Diagnosis not present

## 2024-05-18 DIAGNOSIS — Z435 Encounter for attention to cystostomy: Secondary | ICD-10-CM | POA: Diagnosis not present

## 2024-05-27 DIAGNOSIS — M25561 Pain in right knee: Secondary | ICD-10-CM | POA: Diagnosis not present

## 2024-05-27 DIAGNOSIS — Z466 Encounter for fitting and adjustment of urinary device: Secondary | ICD-10-CM | POA: Diagnosis not present

## 2024-05-27 DIAGNOSIS — Z556 Problems related to health literacy: Secondary | ICD-10-CM | POA: Diagnosis not present

## 2024-05-27 DIAGNOSIS — M05771 Rheumatoid arthritis with rheumatoid factor of right ankle and foot without organ or systems involvement: Secondary | ICD-10-CM | POA: Diagnosis not present

## 2024-05-27 DIAGNOSIS — E559 Vitamin D deficiency, unspecified: Secondary | ICD-10-CM | POA: Diagnosis not present

## 2024-05-27 DIAGNOSIS — Z87442 Personal history of urinary calculi: Secondary | ICD-10-CM | POA: Diagnosis not present

## 2024-05-27 DIAGNOSIS — N184 Chronic kidney disease, stage 4 (severe): Secondary | ICD-10-CM | POA: Diagnosis not present

## 2024-05-27 DIAGNOSIS — K8681 Exocrine pancreatic insufficiency: Secondary | ICD-10-CM | POA: Diagnosis not present

## 2024-05-27 DIAGNOSIS — I129 Hypertensive chronic kidney disease with stage 1 through stage 4 chronic kidney disease, or unspecified chronic kidney disease: Secondary | ICD-10-CM | POA: Diagnosis not present

## 2024-05-27 DIAGNOSIS — H25819 Combined forms of age-related cataract, unspecified eye: Secondary | ICD-10-CM | POA: Diagnosis not present

## 2024-05-27 DIAGNOSIS — Z87891 Personal history of nicotine dependence: Secondary | ICD-10-CM | POA: Diagnosis not present

## 2024-05-27 DIAGNOSIS — Z96641 Presence of right artificial hip joint: Secondary | ICD-10-CM | POA: Diagnosis not present

## 2024-05-27 DIAGNOSIS — F1190 Opioid use, unspecified, uncomplicated: Secondary | ICD-10-CM | POA: Diagnosis not present

## 2024-05-27 DIAGNOSIS — M25562 Pain in left knee: Secondary | ICD-10-CM | POA: Diagnosis not present

## 2024-05-27 DIAGNOSIS — Z87440 Personal history of urinary (tract) infections: Secondary | ICD-10-CM | POA: Diagnosis not present

## 2024-05-27 DIAGNOSIS — Z435 Encounter for attention to cystostomy: Secondary | ICD-10-CM | POA: Diagnosis not present

## 2024-05-27 DIAGNOSIS — G894 Chronic pain syndrome: Secondary | ICD-10-CM | POA: Diagnosis not present

## 2024-05-29 DIAGNOSIS — F1190 Opioid use, unspecified, uncomplicated: Secondary | ICD-10-CM | POA: Diagnosis not present

## 2024-05-29 DIAGNOSIS — I129 Hypertensive chronic kidney disease with stage 1 through stage 4 chronic kidney disease, or unspecified chronic kidney disease: Secondary | ICD-10-CM | POA: Diagnosis not present

## 2024-05-29 DIAGNOSIS — G894 Chronic pain syndrome: Secondary | ICD-10-CM | POA: Diagnosis not present

## 2024-05-29 DIAGNOSIS — M25561 Pain in right knee: Secondary | ICD-10-CM | POA: Diagnosis not present

## 2024-05-29 DIAGNOSIS — H25819 Combined forms of age-related cataract, unspecified eye: Secondary | ICD-10-CM | POA: Diagnosis not present

## 2024-05-29 DIAGNOSIS — Z87891 Personal history of nicotine dependence: Secondary | ICD-10-CM | POA: Diagnosis not present

## 2024-05-29 DIAGNOSIS — N184 Chronic kidney disease, stage 4 (severe): Secondary | ICD-10-CM | POA: Diagnosis not present

## 2024-05-29 DIAGNOSIS — Z556 Problems related to health literacy: Secondary | ICD-10-CM | POA: Diagnosis not present

## 2024-05-29 DIAGNOSIS — Z87442 Personal history of urinary calculi: Secondary | ICD-10-CM | POA: Diagnosis not present

## 2024-05-29 DIAGNOSIS — M25562 Pain in left knee: Secondary | ICD-10-CM | POA: Diagnosis not present

## 2024-05-29 DIAGNOSIS — Z87440 Personal history of urinary (tract) infections: Secondary | ICD-10-CM | POA: Diagnosis not present

## 2024-05-29 DIAGNOSIS — K8681 Exocrine pancreatic insufficiency: Secondary | ICD-10-CM | POA: Diagnosis not present

## 2024-05-29 DIAGNOSIS — Z96641 Presence of right artificial hip joint: Secondary | ICD-10-CM | POA: Diagnosis not present

## 2024-05-29 DIAGNOSIS — E559 Vitamin D deficiency, unspecified: Secondary | ICD-10-CM | POA: Diagnosis not present

## 2024-05-29 DIAGNOSIS — Z435 Encounter for attention to cystostomy: Secondary | ICD-10-CM | POA: Diagnosis not present

## 2024-05-29 DIAGNOSIS — M05771 Rheumatoid arthritis with rheumatoid factor of right ankle and foot without organ or systems involvement: Secondary | ICD-10-CM | POA: Diagnosis not present

## 2024-05-29 DIAGNOSIS — Z466 Encounter for fitting and adjustment of urinary device: Secondary | ICD-10-CM | POA: Diagnosis not present

## 2024-06-09 DIAGNOSIS — R8290 Unspecified abnormal findings in urine: Secondary | ICD-10-CM | POA: Diagnosis not present

## 2024-06-12 DIAGNOSIS — Z87440 Personal history of urinary (tract) infections: Secondary | ICD-10-CM | POA: Diagnosis not present

## 2024-06-15 DIAGNOSIS — R8290 Unspecified abnormal findings in urine: Secondary | ICD-10-CM | POA: Diagnosis not present

## 2024-06-23 DIAGNOSIS — N390 Urinary tract infection, site not specified: Secondary | ICD-10-CM | POA: Diagnosis not present

## 2024-06-29 DIAGNOSIS — S72002A Fracture of unspecified part of neck of left femur, initial encounter for closed fracture: Secondary | ICD-10-CM | POA: Diagnosis not present

## 2024-06-29 DIAGNOSIS — S72422A Displaced fracture of lateral condyle of left femur, initial encounter for closed fracture: Secondary | ICD-10-CM | POA: Diagnosis not present

## 2024-06-29 DIAGNOSIS — I129 Hypertensive chronic kidney disease with stage 1 through stage 4 chronic kidney disease, or unspecified chronic kidney disease: Secondary | ICD-10-CM | POA: Diagnosis not present

## 2024-06-29 DIAGNOSIS — Z9359 Other cystostomy status: Secondary | ICD-10-CM | POA: Diagnosis not present

## 2024-06-29 DIAGNOSIS — N189 Chronic kidney disease, unspecified: Secondary | ICD-10-CM | POA: Diagnosis not present

## 2024-06-29 DIAGNOSIS — M85861 Other specified disorders of bone density and structure, right lower leg: Secondary | ICD-10-CM | POA: Diagnosis not present

## 2024-06-29 DIAGNOSIS — M19071 Primary osteoarthritis, right ankle and foot: Secondary | ICD-10-CM | POA: Diagnosis not present

## 2024-06-29 DIAGNOSIS — A0471 Enterocolitis due to Clostridium difficile, recurrent: Secondary | ICD-10-CM | POA: Diagnosis not present

## 2024-06-29 DIAGNOSIS — M85862 Other specified disorders of bone density and structure, left lower leg: Secondary | ICD-10-CM | POA: Diagnosis not present

## 2024-06-29 DIAGNOSIS — Z96642 Presence of left artificial hip joint: Secondary | ICD-10-CM | POA: Diagnosis not present

## 2024-06-29 DIAGNOSIS — Z96651 Presence of right artificial knee joint: Secondary | ICD-10-CM | POA: Diagnosis not present

## 2024-06-29 DIAGNOSIS — M8580 Other specified disorders of bone density and structure, unspecified site: Secondary | ICD-10-CM | POA: Diagnosis not present

## 2024-06-29 DIAGNOSIS — R0602 Shortness of breath: Secondary | ICD-10-CM | POA: Diagnosis not present

## 2024-06-29 DIAGNOSIS — S82201A Unspecified fracture of shaft of right tibia, initial encounter for closed fracture: Secondary | ICD-10-CM | POA: Diagnosis not present

## 2024-06-29 DIAGNOSIS — M25562 Pain in left knee: Secondary | ICD-10-CM | POA: Diagnosis not present

## 2024-06-29 DIAGNOSIS — M2550 Pain in unspecified joint: Secondary | ICD-10-CM | POA: Diagnosis not present

## 2024-06-29 DIAGNOSIS — I82541 Chronic embolism and thrombosis of right tibial vein: Secondary | ICD-10-CM | POA: Diagnosis not present

## 2024-06-29 DIAGNOSIS — S82101A Unspecified fracture of upper end of right tibia, initial encounter for closed fracture: Secondary | ICD-10-CM | POA: Diagnosis not present

## 2024-06-29 DIAGNOSIS — M25571 Pain in right ankle and joints of right foot: Secondary | ICD-10-CM | POA: Diagnosis not present

## 2024-06-29 DIAGNOSIS — S82401A Unspecified fracture of shaft of right fibula, initial encounter for closed fracture: Secondary | ICD-10-CM | POA: Diagnosis not present

## 2024-06-29 DIAGNOSIS — M85871 Other specified disorders of bone density and structure, right ankle and foot: Secondary | ICD-10-CM | POA: Diagnosis not present

## 2024-06-29 DIAGNOSIS — M25572 Pain in left ankle and joints of left foot: Secondary | ICD-10-CM | POA: Diagnosis not present

## 2024-06-30 DIAGNOSIS — Z4682 Encounter for fitting and adjustment of non-vascular catheter: Secondary | ICD-10-CM | POA: Diagnosis not present

## 2024-06-30 DIAGNOSIS — M978XXD Periprosthetic fracture around other internal prosthetic joint, subsequent encounter: Secondary | ICD-10-CM | POA: Diagnosis not present

## 2024-06-30 DIAGNOSIS — N179 Acute kidney failure, unspecified: Secondary | ICD-10-CM | POA: Diagnosis not present

## 2024-06-30 DIAGNOSIS — R410 Disorientation, unspecified: Secondary | ICD-10-CM | POA: Diagnosis not present

## 2024-06-30 DIAGNOSIS — R600 Localized edema: Secondary | ICD-10-CM | POA: Diagnosis not present

## 2024-06-30 DIAGNOSIS — M069 Rheumatoid arthritis, unspecified: Secondary | ICD-10-CM | POA: Diagnosis not present

## 2024-06-30 DIAGNOSIS — N138 Other obstructive and reflux uropathy: Secondary | ICD-10-CM | POA: Diagnosis not present

## 2024-06-30 DIAGNOSIS — M25571 Pain in right ankle and joints of right foot: Secondary | ICD-10-CM | POA: Diagnosis not present

## 2024-06-30 DIAGNOSIS — I82541 Chronic embolism and thrombosis of right tibial vein: Secondary | ICD-10-CM | POA: Diagnosis not present

## 2024-06-30 DIAGNOSIS — N390 Urinary tract infection, site not specified: Secondary | ICD-10-CM | POA: Diagnosis not present

## 2024-06-30 DIAGNOSIS — M80861D Other osteoporosis with current pathological fracture, right lower leg, subsequent encounter for fracture with routine healing: Secondary | ICD-10-CM | POA: Diagnosis not present

## 2024-06-30 DIAGNOSIS — Z923 Personal history of irradiation: Secondary | ICD-10-CM | POA: Diagnosis not present

## 2024-06-30 DIAGNOSIS — M25561 Pain in right knee: Secondary | ICD-10-CM | POA: Diagnosis not present

## 2024-06-30 DIAGNOSIS — G253 Myoclonus: Secondary | ICD-10-CM | POA: Diagnosis not present

## 2024-06-30 DIAGNOSIS — R54 Age-related physical debility: Secondary | ICD-10-CM | POA: Diagnosis not present

## 2024-06-30 DIAGNOSIS — D631 Anemia in chronic kidney disease: Secondary | ICD-10-CM | POA: Diagnosis not present

## 2024-06-30 DIAGNOSIS — Y738 Miscellaneous gastroenterology and urology devices associated with adverse incidents, not elsewhere classified: Secondary | ICD-10-CM | POA: Diagnosis not present

## 2024-06-30 DIAGNOSIS — T402X5A Adverse effect of other opioids, initial encounter: Secondary | ICD-10-CM | POA: Diagnosis not present

## 2024-06-30 DIAGNOSIS — S82201D Unspecified fracture of shaft of right tibia, subsequent encounter for closed fracture with routine healing: Secondary | ICD-10-CM | POA: Diagnosis not present

## 2024-06-30 DIAGNOSIS — M2550 Pain in unspecified joint: Secondary | ICD-10-CM | POA: Diagnosis not present

## 2024-06-30 DIAGNOSIS — M80061A Age-related osteoporosis with current pathological fracture, right lower leg, initial encounter for fracture: Secondary | ICD-10-CM | POA: Diagnosis not present

## 2024-06-30 DIAGNOSIS — Z452 Encounter for adjustment and management of vascular access device: Secondary | ICD-10-CM | POA: Diagnosis not present

## 2024-06-30 DIAGNOSIS — E872 Acidosis, unspecified: Secondary | ICD-10-CM | POA: Diagnosis not present

## 2024-06-30 DIAGNOSIS — M25562 Pain in left knee: Secondary | ICD-10-CM | POA: Diagnosis not present

## 2024-06-30 DIAGNOSIS — I129 Hypertensive chronic kidney disease with stage 1 through stage 4 chronic kidney disease, or unspecified chronic kidney disease: Secondary | ICD-10-CM | POA: Diagnosis not present

## 2024-06-30 DIAGNOSIS — M810 Age-related osteoporosis without current pathological fracture: Secondary | ICD-10-CM | POA: Diagnosis not present

## 2024-06-30 DIAGNOSIS — E8889 Other specified metabolic disorders: Secondary | ICD-10-CM | POA: Diagnosis not present

## 2024-06-30 DIAGNOSIS — R279 Unspecified lack of coordination: Secondary | ICD-10-CM | POA: Diagnosis not present

## 2024-06-30 DIAGNOSIS — N189 Chronic kidney disease, unspecified: Secondary | ICD-10-CM | POA: Diagnosis not present

## 2024-06-30 DIAGNOSIS — S82401A Unspecified fracture of shaft of right fibula, initial encounter for closed fracture: Secondary | ICD-10-CM | POA: Diagnosis not present

## 2024-06-30 DIAGNOSIS — D649 Anemia, unspecified: Secondary | ICD-10-CM | POA: Diagnosis not present

## 2024-06-30 DIAGNOSIS — Z8541 Personal history of malignant neoplasm of cervix uteri: Secondary | ICD-10-CM | POA: Diagnosis not present

## 2024-06-30 DIAGNOSIS — S82251A Displaced comminuted fracture of shaft of right tibia, initial encounter for closed fracture: Secondary | ICD-10-CM | POA: Diagnosis not present

## 2024-06-30 DIAGNOSIS — Z9359 Other cystostomy status: Secondary | ICD-10-CM | POA: Diagnosis not present

## 2024-06-30 DIAGNOSIS — R4182 Altered mental status, unspecified: Secondary | ICD-10-CM | POA: Diagnosis not present

## 2024-06-30 DIAGNOSIS — Y846 Urinary catheterization as the cause of abnormal reaction of the patient, or of later complication, without mention of misadventure at the time of the procedure: Secondary | ICD-10-CM | POA: Diagnosis not present

## 2024-06-30 DIAGNOSIS — R5381 Other malaise: Secondary | ICD-10-CM | POA: Diagnosis not present

## 2024-06-30 DIAGNOSIS — N320 Bladder-neck obstruction: Secondary | ICD-10-CM | POA: Diagnosis not present

## 2024-06-30 DIAGNOSIS — S82201A Unspecified fracture of shaft of right tibia, initial encounter for closed fracture: Secondary | ICD-10-CM | POA: Diagnosis not present

## 2024-06-30 DIAGNOSIS — M6259 Muscle wasting and atrophy, not elsewhere classified, multiple sites: Secondary | ICD-10-CM | POA: Diagnosis not present

## 2024-06-30 DIAGNOSIS — Z79899 Other long term (current) drug therapy: Secondary | ICD-10-CM | POA: Diagnosis not present

## 2024-06-30 DIAGNOSIS — G8929 Other chronic pain: Secondary | ICD-10-CM | POA: Diagnosis not present

## 2024-06-30 DIAGNOSIS — S82401D Unspecified fracture of shaft of right fibula, subsequent encounter for closed fracture with routine healing: Secondary | ICD-10-CM | POA: Diagnosis not present

## 2024-06-30 DIAGNOSIS — M6281 Muscle weakness (generalized): Secondary | ICD-10-CM | POA: Diagnosis not present

## 2024-06-30 DIAGNOSIS — T83510A Infection and inflammatory reaction due to cystostomy catheter, initial encounter: Secondary | ICD-10-CM | POA: Diagnosis not present

## 2024-06-30 DIAGNOSIS — M85861 Other specified disorders of bone density and structure, right lower leg: Secondary | ICD-10-CM | POA: Diagnosis not present

## 2024-06-30 DIAGNOSIS — S82831A Other fracture of upper and lower end of right fibula, initial encounter for closed fracture: Secondary | ICD-10-CM | POA: Diagnosis not present

## 2024-06-30 DIAGNOSIS — M25572 Pain in left ankle and joints of left foot: Secondary | ICD-10-CM | POA: Diagnosis not present

## 2024-06-30 DIAGNOSIS — Z96641 Presence of right artificial hip joint: Secondary | ICD-10-CM | POA: Diagnosis not present

## 2024-06-30 DIAGNOSIS — Z1152 Encounter for screening for COVID-19: Secondary | ICD-10-CM | POA: Diagnosis not present

## 2024-06-30 DIAGNOSIS — S82101A Unspecified fracture of upper end of right tibia, initial encounter for closed fracture: Secondary | ICD-10-CM | POA: Diagnosis not present

## 2024-06-30 DIAGNOSIS — T426X5A Adverse effect of other antiepileptic and sedative-hypnotic drugs, initial encounter: Secondary | ICD-10-CM | POA: Diagnosis not present

## 2024-06-30 DIAGNOSIS — N1832 Chronic kidney disease, stage 3b: Secondary | ICD-10-CM | POA: Diagnosis not present

## 2024-07-10 DIAGNOSIS — M80861D Other osteoporosis with current pathological fracture, right lower leg, subsequent encounter for fracture with routine healing: Secondary | ICD-10-CM | POA: Diagnosis not present

## 2024-07-10 DIAGNOSIS — M069 Rheumatoid arthritis, unspecified: Secondary | ICD-10-CM | POA: Diagnosis not present

## 2024-07-10 DIAGNOSIS — S82401A Unspecified fracture of shaft of right fibula, initial encounter for closed fracture: Secondary | ICD-10-CM | POA: Diagnosis not present

## 2024-07-10 DIAGNOSIS — M6259 Muscle wasting and atrophy, not elsewhere classified, multiple sites: Secondary | ICD-10-CM | POA: Diagnosis not present

## 2024-07-10 DIAGNOSIS — R279 Unspecified lack of coordination: Secondary | ICD-10-CM | POA: Diagnosis not present

## 2024-07-10 DIAGNOSIS — S82201D Unspecified fracture of shaft of right tibia, subsequent encounter for closed fracture with routine healing: Secondary | ICD-10-CM | POA: Diagnosis not present

## 2024-07-10 DIAGNOSIS — N138 Other obstructive and reflux uropathy: Secondary | ICD-10-CM | POA: Diagnosis not present

## 2024-07-10 DIAGNOSIS — M6281 Muscle weakness (generalized): Secondary | ICD-10-CM | POA: Diagnosis not present

## 2024-07-10 DIAGNOSIS — D649 Anemia, unspecified: Secondary | ICD-10-CM | POA: Diagnosis not present

## 2024-07-10 DIAGNOSIS — W19XXXD Unspecified fall, subsequent encounter: Secondary | ICD-10-CM | POA: Diagnosis not present

## 2024-07-10 DIAGNOSIS — R5381 Other malaise: Secondary | ICD-10-CM | POA: Diagnosis not present

## 2024-07-10 DIAGNOSIS — S82401D Unspecified fracture of shaft of right fibula, subsequent encounter for closed fracture with routine healing: Secondary | ICD-10-CM | POA: Diagnosis not present

## 2024-07-10 DIAGNOSIS — S82201A Unspecified fracture of shaft of right tibia, initial encounter for closed fracture: Secondary | ICD-10-CM | POA: Diagnosis not present

## 2024-07-10 DIAGNOSIS — M810 Age-related osteoporosis without current pathological fracture: Secondary | ICD-10-CM | POA: Diagnosis not present

## 2024-07-10 DIAGNOSIS — N189 Chronic kidney disease, unspecified: Secondary | ICD-10-CM | POA: Diagnosis not present

## 2024-07-10 DIAGNOSIS — M978XXD Periprosthetic fracture around other internal prosthetic joint, subsequent encounter: Secondary | ICD-10-CM | POA: Diagnosis not present

## 2024-07-13 DIAGNOSIS — S82201D Unspecified fracture of shaft of right tibia, subsequent encounter for closed fracture with routine healing: Secondary | ICD-10-CM | POA: Diagnosis not present

## 2024-07-13 DIAGNOSIS — N138 Other obstructive and reflux uropathy: Secondary | ICD-10-CM | POA: Diagnosis not present

## 2024-07-13 DIAGNOSIS — S82401D Unspecified fracture of shaft of right fibula, subsequent encounter for closed fracture with routine healing: Secondary | ICD-10-CM | POA: Diagnosis not present

## 2024-07-13 DIAGNOSIS — M80861D Other osteoporosis with current pathological fracture, right lower leg, subsequent encounter for fracture with routine healing: Secondary | ICD-10-CM | POA: Diagnosis not present

## 2024-07-17 DIAGNOSIS — Z87891 Personal history of nicotine dependence: Secondary | ICD-10-CM | POA: Diagnosis not present

## 2024-07-17 DIAGNOSIS — Z96659 Presence of unspecified artificial knee joint: Secondary | ICD-10-CM | POA: Diagnosis not present

## 2024-07-17 DIAGNOSIS — Z96642 Presence of left artificial hip joint: Secondary | ICD-10-CM | POA: Diagnosis not present

## 2024-07-17 DIAGNOSIS — Z8541 Personal history of malignant neoplasm of cervix uteri: Secondary | ICD-10-CM | POA: Diagnosis not present

## 2024-07-17 DIAGNOSIS — D631 Anemia in chronic kidney disease: Secondary | ICD-10-CM | POA: Diagnosis not present

## 2024-07-17 DIAGNOSIS — Z435 Encounter for attention to cystostomy: Secondary | ICD-10-CM | POA: Diagnosis not present

## 2024-07-17 DIAGNOSIS — Z7901 Long term (current) use of anticoagulants: Secondary | ICD-10-CM | POA: Diagnosis not present

## 2024-07-17 DIAGNOSIS — M05771 Rheumatoid arthritis with rheumatoid factor of right ankle and foot without organ or systems involvement: Secondary | ICD-10-CM | POA: Diagnosis not present

## 2024-07-17 DIAGNOSIS — G894 Chronic pain syndrome: Secondary | ICD-10-CM | POA: Diagnosis not present

## 2024-07-17 DIAGNOSIS — Z961 Presence of intraocular lens: Secondary | ICD-10-CM | POA: Diagnosis not present

## 2024-07-17 DIAGNOSIS — Z85828 Personal history of other malignant neoplasm of skin: Secondary | ICD-10-CM | POA: Diagnosis not present

## 2024-07-17 DIAGNOSIS — K8681 Exocrine pancreatic insufficiency: Secondary | ICD-10-CM | POA: Diagnosis not present

## 2024-07-17 DIAGNOSIS — Z556 Problems related to health literacy: Secondary | ICD-10-CM | POA: Diagnosis not present

## 2024-07-17 DIAGNOSIS — N184 Chronic kidney disease, stage 4 (severe): Secondary | ICD-10-CM | POA: Diagnosis not present

## 2024-07-17 DIAGNOSIS — I129 Hypertensive chronic kidney disease with stage 1 through stage 4 chronic kidney disease, or unspecified chronic kidney disease: Secondary | ICD-10-CM | POA: Diagnosis not present

## 2024-07-17 DIAGNOSIS — Z5982 Transportation insecurity: Secondary | ICD-10-CM | POA: Diagnosis not present

## 2024-07-17 DIAGNOSIS — M80061D Age-related osteoporosis with current pathological fracture, right lower leg, subsequent encounter for fracture with routine healing: Secondary | ICD-10-CM | POA: Diagnosis not present

## 2024-07-21 DIAGNOSIS — Z85828 Personal history of other malignant neoplasm of skin: Secondary | ICD-10-CM | POA: Diagnosis not present

## 2024-07-21 DIAGNOSIS — Z96642 Presence of left artificial hip joint: Secondary | ICD-10-CM | POA: Diagnosis not present

## 2024-07-21 DIAGNOSIS — M80061D Age-related osteoporosis with current pathological fracture, right lower leg, subsequent encounter for fracture with routine healing: Secondary | ICD-10-CM | POA: Diagnosis not present

## 2024-07-21 DIAGNOSIS — N184 Chronic kidney disease, stage 4 (severe): Secondary | ICD-10-CM | POA: Diagnosis not present

## 2024-07-21 DIAGNOSIS — Z96659 Presence of unspecified artificial knee joint: Secondary | ICD-10-CM | POA: Diagnosis not present

## 2024-07-21 DIAGNOSIS — Z7901 Long term (current) use of anticoagulants: Secondary | ICD-10-CM | POA: Diagnosis not present

## 2024-07-21 DIAGNOSIS — I129 Hypertensive chronic kidney disease with stage 1 through stage 4 chronic kidney disease, or unspecified chronic kidney disease: Secondary | ICD-10-CM | POA: Diagnosis not present

## 2024-07-21 DIAGNOSIS — Z961 Presence of intraocular lens: Secondary | ICD-10-CM | POA: Diagnosis not present

## 2024-07-21 DIAGNOSIS — G894 Chronic pain syndrome: Secondary | ICD-10-CM | POA: Diagnosis not present

## 2024-07-21 DIAGNOSIS — Z8541 Personal history of malignant neoplasm of cervix uteri: Secondary | ICD-10-CM | POA: Diagnosis not present

## 2024-07-21 DIAGNOSIS — K8681 Exocrine pancreatic insufficiency: Secondary | ICD-10-CM | POA: Diagnosis not present

## 2024-07-21 DIAGNOSIS — M05771 Rheumatoid arthritis with rheumatoid factor of right ankle and foot without organ or systems involvement: Secondary | ICD-10-CM | POA: Diagnosis not present

## 2024-07-21 DIAGNOSIS — D631 Anemia in chronic kidney disease: Secondary | ICD-10-CM | POA: Diagnosis not present

## 2024-07-21 DIAGNOSIS — Z556 Problems related to health literacy: Secondary | ICD-10-CM | POA: Diagnosis not present

## 2024-07-21 DIAGNOSIS — Z87891 Personal history of nicotine dependence: Secondary | ICD-10-CM | POA: Diagnosis not present

## 2024-07-21 DIAGNOSIS — Z5982 Transportation insecurity: Secondary | ICD-10-CM | POA: Diagnosis not present

## 2024-07-21 DIAGNOSIS — Z435 Encounter for attention to cystostomy: Secondary | ICD-10-CM | POA: Diagnosis not present

## 2024-07-23 DIAGNOSIS — Z435 Encounter for attention to cystostomy: Secondary | ICD-10-CM | POA: Diagnosis not present

## 2024-07-23 DIAGNOSIS — Z8541 Personal history of malignant neoplasm of cervix uteri: Secondary | ICD-10-CM | POA: Diagnosis not present

## 2024-07-23 DIAGNOSIS — M05771 Rheumatoid arthritis with rheumatoid factor of right ankle and foot without organ or systems involvement: Secondary | ICD-10-CM | POA: Diagnosis not present

## 2024-07-23 DIAGNOSIS — Z96659 Presence of unspecified artificial knee joint: Secondary | ICD-10-CM | POA: Diagnosis not present

## 2024-07-23 DIAGNOSIS — G894 Chronic pain syndrome: Secondary | ICD-10-CM | POA: Diagnosis not present

## 2024-07-23 DIAGNOSIS — Z87891 Personal history of nicotine dependence: Secondary | ICD-10-CM | POA: Diagnosis not present

## 2024-07-23 DIAGNOSIS — D631 Anemia in chronic kidney disease: Secondary | ICD-10-CM | POA: Diagnosis not present

## 2024-07-23 DIAGNOSIS — N184 Chronic kidney disease, stage 4 (severe): Secondary | ICD-10-CM | POA: Diagnosis not present

## 2024-07-23 DIAGNOSIS — M80061D Age-related osteoporosis with current pathological fracture, right lower leg, subsequent encounter for fracture with routine healing: Secondary | ICD-10-CM | POA: Diagnosis not present

## 2024-07-23 DIAGNOSIS — K8681 Exocrine pancreatic insufficiency: Secondary | ICD-10-CM | POA: Diagnosis not present

## 2024-07-23 DIAGNOSIS — Z5982 Transportation insecurity: Secondary | ICD-10-CM | POA: Diagnosis not present

## 2024-07-23 DIAGNOSIS — Z7901 Long term (current) use of anticoagulants: Secondary | ICD-10-CM | POA: Diagnosis not present

## 2024-07-23 DIAGNOSIS — I129 Hypertensive chronic kidney disease with stage 1 through stage 4 chronic kidney disease, or unspecified chronic kidney disease: Secondary | ICD-10-CM | POA: Diagnosis not present

## 2024-07-23 DIAGNOSIS — Z961 Presence of intraocular lens: Secondary | ICD-10-CM | POA: Diagnosis not present

## 2024-07-23 DIAGNOSIS — Z556 Problems related to health literacy: Secondary | ICD-10-CM | POA: Diagnosis not present

## 2024-07-23 DIAGNOSIS — Z85828 Personal history of other malignant neoplasm of skin: Secondary | ICD-10-CM | POA: Diagnosis not present

## 2024-07-23 DIAGNOSIS — Z96642 Presence of left artificial hip joint: Secondary | ICD-10-CM | POA: Diagnosis not present

## 2024-07-29 DIAGNOSIS — G894 Chronic pain syndrome: Secondary | ICD-10-CM | POA: Diagnosis not present

## 2024-07-29 DIAGNOSIS — Z556 Problems related to health literacy: Secondary | ICD-10-CM | POA: Diagnosis not present

## 2024-07-29 DIAGNOSIS — I129 Hypertensive chronic kidney disease with stage 1 through stage 4 chronic kidney disease, or unspecified chronic kidney disease: Secondary | ICD-10-CM | POA: Diagnosis not present

## 2024-07-29 DIAGNOSIS — Z7901 Long term (current) use of anticoagulants: Secondary | ICD-10-CM | POA: Diagnosis not present

## 2024-07-29 DIAGNOSIS — Z85828 Personal history of other malignant neoplasm of skin: Secondary | ICD-10-CM | POA: Diagnosis not present

## 2024-07-29 DIAGNOSIS — Z961 Presence of intraocular lens: Secondary | ICD-10-CM | POA: Diagnosis not present

## 2024-07-29 DIAGNOSIS — Z96659 Presence of unspecified artificial knee joint: Secondary | ICD-10-CM | POA: Diagnosis not present

## 2024-07-29 DIAGNOSIS — M05771 Rheumatoid arthritis with rheumatoid factor of right ankle and foot without organ or systems involvement: Secondary | ICD-10-CM | POA: Diagnosis not present

## 2024-07-29 DIAGNOSIS — Z435 Encounter for attention to cystostomy: Secondary | ICD-10-CM | POA: Diagnosis not present

## 2024-07-29 DIAGNOSIS — N184 Chronic kidney disease, stage 4 (severe): Secondary | ICD-10-CM | POA: Diagnosis not present

## 2024-07-29 DIAGNOSIS — D631 Anemia in chronic kidney disease: Secondary | ICD-10-CM | POA: Diagnosis not present

## 2024-07-29 DIAGNOSIS — Z96642 Presence of left artificial hip joint: Secondary | ICD-10-CM | POA: Diagnosis not present

## 2024-07-29 DIAGNOSIS — M80061D Age-related osteoporosis with current pathological fracture, right lower leg, subsequent encounter for fracture with routine healing: Secondary | ICD-10-CM | POA: Diagnosis not present

## 2024-07-29 DIAGNOSIS — K8681 Exocrine pancreatic insufficiency: Secondary | ICD-10-CM | POA: Diagnosis not present

## 2024-07-29 DIAGNOSIS — Z87891 Personal history of nicotine dependence: Secondary | ICD-10-CM | POA: Diagnosis not present

## 2024-07-29 DIAGNOSIS — Z5982 Transportation insecurity: Secondary | ICD-10-CM | POA: Diagnosis not present

## 2024-07-29 DIAGNOSIS — Z8541 Personal history of malignant neoplasm of cervix uteri: Secondary | ICD-10-CM | POA: Diagnosis not present

## 2024-08-12 DIAGNOSIS — N184 Chronic kidney disease, stage 4 (severe): Secondary | ICD-10-CM | POA: Diagnosis not present

## 2024-08-12 DIAGNOSIS — Z85828 Personal history of other malignant neoplasm of skin: Secondary | ICD-10-CM | POA: Diagnosis not present

## 2024-08-12 DIAGNOSIS — Z8541 Personal history of malignant neoplasm of cervix uteri: Secondary | ICD-10-CM | POA: Diagnosis not present

## 2024-08-12 DIAGNOSIS — M05771 Rheumatoid arthritis with rheumatoid factor of right ankle and foot without organ or systems involvement: Secondary | ICD-10-CM | POA: Diagnosis not present

## 2024-08-12 DIAGNOSIS — G894 Chronic pain syndrome: Secondary | ICD-10-CM | POA: Diagnosis not present

## 2024-08-12 DIAGNOSIS — Z87891 Personal history of nicotine dependence: Secondary | ICD-10-CM | POA: Diagnosis not present

## 2024-08-12 DIAGNOSIS — Z5982 Transportation insecurity: Secondary | ICD-10-CM | POA: Diagnosis not present

## 2024-08-12 DIAGNOSIS — Z96659 Presence of unspecified artificial knee joint: Secondary | ICD-10-CM | POA: Diagnosis not present

## 2024-08-12 DIAGNOSIS — D631 Anemia in chronic kidney disease: Secondary | ICD-10-CM | POA: Diagnosis not present

## 2024-08-12 DIAGNOSIS — Z7901 Long term (current) use of anticoagulants: Secondary | ICD-10-CM | POA: Diagnosis not present

## 2024-08-12 DIAGNOSIS — Z96642 Presence of left artificial hip joint: Secondary | ICD-10-CM | POA: Diagnosis not present

## 2024-08-12 DIAGNOSIS — I129 Hypertensive chronic kidney disease with stage 1 through stage 4 chronic kidney disease, or unspecified chronic kidney disease: Secondary | ICD-10-CM | POA: Diagnosis not present

## 2024-08-12 DIAGNOSIS — Z961 Presence of intraocular lens: Secondary | ICD-10-CM | POA: Diagnosis not present

## 2024-08-12 DIAGNOSIS — Z435 Encounter for attention to cystostomy: Secondary | ICD-10-CM | POA: Diagnosis not present

## 2024-08-12 DIAGNOSIS — Z556 Problems related to health literacy: Secondary | ICD-10-CM | POA: Diagnosis not present

## 2024-08-12 DIAGNOSIS — K8681 Exocrine pancreatic insufficiency: Secondary | ICD-10-CM | POA: Diagnosis not present

## 2024-08-12 DIAGNOSIS — M80061D Age-related osteoporosis with current pathological fracture, right lower leg, subsequent encounter for fracture with routine healing: Secondary | ICD-10-CM | POA: Diagnosis not present
